# Patient Record
Sex: Female | Born: 1955 | Race: White | Hispanic: No | Marital: Married | Smoking: Current every day smoker
Health system: Southern US, Community
[De-identification: ages and names within clinical notes are randomized; demographics above are authoritative.]

## PROBLEM LIST (undated history)

## (undated) DIAGNOSIS — N390 Urinary tract infection, site not specified: Secondary | ICD-10-CM

## (undated) DIAGNOSIS — F78A9 Other genetic related intellectual disability: Secondary | ICD-10-CM

## (undated) DIAGNOSIS — I219 Acute myocardial infarction, unspecified: Secondary | ICD-10-CM

## (undated) DIAGNOSIS — Z9289 Personal history of other medical treatment: Secondary | ICD-10-CM

## (undated) DIAGNOSIS — I447 Left bundle-branch block, unspecified: Secondary | ICD-10-CM

## (undated) DIAGNOSIS — B019 Varicella without complication: Secondary | ICD-10-CM

## (undated) DIAGNOSIS — G709 Myoneural disorder, unspecified: Secondary | ICD-10-CM

## (undated) DIAGNOSIS — E079 Disorder of thyroid, unspecified: Secondary | ICD-10-CM

## (undated) DIAGNOSIS — N289 Disorder of kidney and ureter, unspecified: Secondary | ICD-10-CM

## (undated) DIAGNOSIS — Z151 Genetic susceptibility to epilepsy and neurodevelopmental disorders: Secondary | ICD-10-CM

## (undated) DIAGNOSIS — T7840XA Allergy, unspecified, initial encounter: Secondary | ICD-10-CM

## (undated) DIAGNOSIS — Q9359 Other deletions of part of a chromosome: Secondary | ICD-10-CM

## (undated) DIAGNOSIS — N183 Chronic kidney disease, stage 3 unspecified: Secondary | ICD-10-CM

## (undated) DIAGNOSIS — F79 Unspecified intellectual disabilities: Secondary | ICD-10-CM

## (undated) DIAGNOSIS — D560 Alpha thalassemia: Secondary | ICD-10-CM

## (undated) DIAGNOSIS — K921 Melena: Secondary | ICD-10-CM

## (undated) DIAGNOSIS — I728 Aneurysm of other specified arteries: Secondary | ICD-10-CM

## (undated) DIAGNOSIS — R519 Headache, unspecified: Secondary | ICD-10-CM

## (undated) DIAGNOSIS — I499 Cardiac arrhythmia, unspecified: Secondary | ICD-10-CM

## (undated) DIAGNOSIS — M1990 Unspecified osteoarthritis, unspecified site: Secondary | ICD-10-CM

## (undated) DIAGNOSIS — R51 Headache: Secondary | ICD-10-CM

## (undated) HISTORY — DX: Left bundle-branch block, unspecified: I44.7

## (undated) HISTORY — DX: Disorder of thyroid, unspecified: E07.9

## (undated) HISTORY — DX: Other deletions of part of a chromosome: Q93.59

## (undated) HISTORY — DX: Personal history of other medical treatment: Z92.89

## (undated) HISTORY — DX: Cardiac arrhythmia, unspecified: I49.9

## (undated) HISTORY — DX: Other genetic related intellectual disability: F78.A9

## (undated) HISTORY — DX: Myoneural disorder, unspecified: G70.9

## (undated) HISTORY — DX: Headache, unspecified: R51.9

## (undated) HISTORY — DX: Urinary tract infection, site not specified: N39.0

## (undated) HISTORY — DX: Allergy, unspecified, initial encounter: T78.40XA

## (undated) HISTORY — DX: Aneurysm of other specified arteries: I72.8

## (undated) HISTORY — PX: TUBAL LIGATION: SHX77

## (undated) HISTORY — DX: Melena: K92.1

## (undated) HISTORY — DX: Acute myocardial infarction, unspecified: I21.9

## (undated) HISTORY — DX: Unspecified intellectual disabilities: F79

## (undated) HISTORY — DX: Unspecified osteoarthritis, unspecified site: M19.90

## (undated) HISTORY — DX: Alpha thalassemia: D56.0

## (undated) HISTORY — DX: Varicella without complication: B01.9

## (undated) HISTORY — DX: Headache: R51

## (undated) HISTORY — DX: Genetic susceptibility to epilepsy and neurodevelopmental disorders: Z15.1

---
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
## 1979-05-29 HISTORY — PX: APPENDECTOMY: SHX54

## 1981-05-28 HISTORY — PX: OTHER SURGICAL HISTORY: SHX169

## 1990-05-28 HISTORY — PX: THUMB AMPUTATION: SHX804

## 1995-05-29 HISTORY — PX: ABDOMINAL HYSTERECTOMY: SHX81

## 2002-05-28 HISTORY — PX: CARDIAC CATHETERIZATION: SHX172

## 2004-05-28 HISTORY — PX: CARDIAC CATHETERIZATION: SHX172

## 2008-08-11 ENCOUNTER — Ambulatory Visit: Payer: Self-pay | Admitting: Internal Medicine

## 2008-08-16 LAB — FECAL OCCULT BLOOD, GUAIAC: Fecal Occult Blood: NEGATIVE

## 2010-11-10 ENCOUNTER — Ambulatory Visit: Payer: Self-pay | Admitting: Internal Medicine

## 2011-03-19 LAB — HM PAP SMEAR: HM Pap smear: NORMAL

## 2011-04-26 ENCOUNTER — Ambulatory Visit: Payer: Self-pay | Admitting: Family Medicine

## 2011-04-26 LAB — HM MAMMOGRAPHY: HM Mammogram: NORMAL

## 2012-08-01 ENCOUNTER — Ambulatory Visit: Payer: Self-pay | Admitting: Internal Medicine

## 2012-08-15 ENCOUNTER — Ambulatory Visit (INDEPENDENT_AMBULATORY_CARE_PROVIDER_SITE_OTHER): Payer: 59 | Admitting: Internal Medicine

## 2012-08-15 ENCOUNTER — Encounter: Payer: Self-pay | Admitting: Internal Medicine

## 2012-08-15 DIAGNOSIS — F17200 Nicotine dependence, unspecified, uncomplicated: Secondary | ICD-10-CM

## 2012-08-15 DIAGNOSIS — M719 Bursopathy, unspecified: Secondary | ICD-10-CM

## 2012-08-15 DIAGNOSIS — R3129 Other microscopic hematuria: Secondary | ICD-10-CM

## 2012-08-15 DIAGNOSIS — E039 Hypothyroidism, unspecified: Secondary | ICD-10-CM

## 2012-08-15 DIAGNOSIS — H833X3 Noise effects on inner ear, bilateral: Secondary | ICD-10-CM

## 2012-08-15 DIAGNOSIS — Z1239 Encounter for other screening for malignant neoplasm of breast: Secondary | ICD-10-CM

## 2012-08-15 DIAGNOSIS — H833X9 Noise effects on inner ear, unspecified ear: Secondary | ICD-10-CM

## 2012-08-15 DIAGNOSIS — K589 Irritable bowel syndrome without diarrhea: Secondary | ICD-10-CM

## 2012-08-15 DIAGNOSIS — D72829 Elevated white blood cell count, unspecified: Secondary | ICD-10-CM

## 2012-08-15 DIAGNOSIS — R0681 Apnea, not elsewhere classified: Secondary | ICD-10-CM

## 2012-08-15 DIAGNOSIS — R5381 Other malaise: Secondary | ICD-10-CM

## 2012-08-15 DIAGNOSIS — Z1211 Encounter for screening for malignant neoplasm of colon: Secondary | ICD-10-CM

## 2012-08-15 DIAGNOSIS — Z720 Tobacco use: Secondary | ICD-10-CM

## 2012-08-15 DIAGNOSIS — Z13220 Encounter for screening for lipoid disorders: Secondary | ICD-10-CM

## 2012-08-15 DIAGNOSIS — Z79899 Other long term (current) drug therapy: Secondary | ICD-10-CM

## 2012-08-15 DIAGNOSIS — R5383 Other fatigue: Secondary | ICD-10-CM

## 2012-08-15 DIAGNOSIS — D563 Thalassemia minor: Secondary | ICD-10-CM

## 2012-08-15 DIAGNOSIS — M7552 Bursitis of left shoulder: Secondary | ICD-10-CM

## 2012-08-15 DIAGNOSIS — L259 Unspecified contact dermatitis, unspecified cause: Secondary | ICD-10-CM

## 2012-08-15 DIAGNOSIS — M67919 Unspecified disorder of synovium and tendon, unspecified shoulder: Secondary | ICD-10-CM

## 2012-08-15 LAB — CBC WITH DIFFERENTIAL/PLATELET
Basophils Absolute: 0 10*3/uL (ref 0.0–0.1)
Basophils Relative: 0 % (ref 0–1)
Eosinophils Absolute: 0.2 10*3/uL (ref 0.0–0.7)
Eosinophils Relative: 2 % (ref 0–5)
HCT: 31.3 % — ABNORMAL LOW (ref 36.0–46.0)
Hemoglobin: 10 g/dL — ABNORMAL LOW (ref 12.0–15.0)
Lymphocytes Relative: 25 % (ref 12–46)
Lymphs Abs: 3 10*3/uL (ref 0.7–4.0)
MCH: 21.6 pg — ABNORMAL LOW (ref 26.0–34.0)
MCHC: 31.9 g/dL (ref 30.0–36.0)
MCV: 67.7 fL — ABNORMAL LOW (ref 78.0–100.0)
Monocytes Absolute: 0.9 10*3/uL (ref 0.1–1.0)
Monocytes Relative: 8 % (ref 3–12)
Neutro Abs: 7.9 10*3/uL — ABNORMAL HIGH (ref 1.7–7.7)
Neutrophils Relative %: 65 % (ref 43–77)
Platelets: 306 10*3/uL (ref 150–400)
RBC: 4.62 MIL/uL (ref 3.87–5.11)
RDW: 18.5 % — ABNORMAL HIGH (ref 11.5–15.5)
WBC: 12 10*3/uL — ABNORMAL HIGH (ref 4.0–10.5)

## 2012-08-15 LAB — COMPREHENSIVE METABOLIC PANEL
ALT: 18 U/L (ref 0–35)
AST: 20 U/L (ref 0–37)
Albumin: 4.6 g/dL (ref 3.5–5.2)
Alkaline Phosphatase: 65 U/L (ref 39–117)
BUN: 15 mg/dL (ref 6–23)
CO2: 27 mEq/L (ref 19–32)
Calcium: 9.7 mg/dL (ref 8.4–10.5)
Chloride: 106 mEq/L (ref 96–112)
Creat: 0.8 mg/dL (ref 0.50–1.10)
Glucose, Bld: 86 mg/dL (ref 70–99)
Potassium: 4.2 mEq/L (ref 3.5–5.3)
Sodium: 140 mEq/L (ref 135–145)
Total Bilirubin: 0.8 mg/dL (ref 0.3–1.2)
Total Protein: 6.5 g/dL (ref 6.0–8.3)

## 2012-08-15 LAB — LDL CHOLESTEROL, DIRECT: Direct LDL: 104 mg/dL — ABNORMAL HIGH

## 2012-08-15 LAB — TSH: TSH: 0.715 u[IU]/mL (ref 0.350–4.500)

## 2012-08-15 MED ORDER — HYDROCODONE-ACETAMINOPHEN 5-325 MG PO TABS: 1.0000 | ORAL_TABLET | Freq: Four times a day (QID) | ORAL | Status: DC | PRN

## 2012-08-15 MED ORDER — EPINEPHRINE 0.3 MG/0.3ML IJ DEVI: 0.3000 mg | Freq: Once | INTRAMUSCULAR | Status: DC

## 2012-08-15 MED ORDER — MELOXICAM 15 MG PO TABS: 15.0000 mg | ORAL_TABLET | Freq: Every day | ORAL | Status: DC

## 2012-08-15 NOTE — Patient Instructions (Addendum)
Please take one meloxicam daily for inflammation in your shoulder.  You have bursitis  You can use the vicodin as needed for pain control.   We will call you with lab results and referrals
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
## 2012-08-16 ENCOUNTER — Encounter: Payer: Self-pay | Admitting: Internal Medicine

## 2012-08-16 DIAGNOSIS — R3129 Other microscopic hematuria: Secondary | ICD-10-CM | POA: Insufficient documentation

## 2012-08-16 DIAGNOSIS — D563 Thalassemia minor: Secondary | ICD-10-CM | POA: Insufficient documentation

## 2012-08-17 ENCOUNTER — Encounter: Payer: Self-pay | Admitting: Internal Medicine

## 2012-08-17 DIAGNOSIS — K589 Irritable bowel syndrome without diarrhea: Secondary | ICD-10-CM

## 2012-08-17 DIAGNOSIS — H833X9 Noise effects on inner ear, unspecified ear: Secondary | ICD-10-CM | POA: Insufficient documentation

## 2012-08-17 DIAGNOSIS — K5790 Diverticulosis of intestine, part unspecified, without perforation or abscess without bleeding: Secondary | ICD-10-CM | POA: Insufficient documentation

## 2012-08-17 DIAGNOSIS — M7552 Bursitis of left shoulder: Secondary | ICD-10-CM | POA: Insufficient documentation

## 2012-08-17 DIAGNOSIS — G4733 Obstructive sleep apnea (adult) (pediatric): Secondary | ICD-10-CM | POA: Insufficient documentation

## 2012-08-17 DIAGNOSIS — E039 Hypothyroidism, unspecified: Secondary | ICD-10-CM | POA: Insufficient documentation

## 2012-08-17 DIAGNOSIS — L981 Factitial dermatitis: Secondary | ICD-10-CM | POA: Insufficient documentation

## 2012-08-17 DIAGNOSIS — D72829 Elevated white blood cell count, unspecified: Secondary | ICD-10-CM | POA: Insufficient documentation

## 2012-08-17 DIAGNOSIS — Z87891 Personal history of nicotine dependence: Secondary | ICD-10-CM | POA: Insufficient documentation

## 2012-08-17 DIAGNOSIS — Z720 Tobacco use: Secondary | ICD-10-CM | POA: Insufficient documentation

## 2012-08-17 HISTORY — DX: Irritable bowel syndrome, unspecified: K58.9
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
## 2012-08-17 NOTE — Assessment & Plan Note (Signed)
Records from  skin have been requested. According to patient she had one biopsy which showed discoid lupus but serologic evaluation for lupus was negative.

## 2012-08-17 NOTE — Assessment & Plan Note (Addendum)
Review of notes suggest that she has had blood transfusions in the past for symptomatic anemia. Her hemoglobin by CBC today is 10.

## 2012-08-17 NOTE — Assessment & Plan Note (Signed)
Regular screening has been aborted both with FOBT's and no prior colonoscopy. She is willing now to be referred for colonoscopy. Her husband saw Dr. Lemar Livings as she is requesting referral to him as well.

## 2012-08-17 NOTE — Assessment & Plan Note (Signed)
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 She is having trouble hearing high-pitched sounds and women's voices. Will refer to [facility] for audiology testing.

## 2012-08-17 NOTE — Assessment & Plan Note (Signed)
Colonoscopy has been ordered as she has had no prior.

## 2012-08-17 NOTE — Assessment & Plan Note (Signed)
Per patient she has reduced her consumption from one to 2 packs daily to less than half a pack a day. Encouragement provided

## 2012-08-17 NOTE — Addendum Note (Signed)
Addended by: Sherlene Shams on: 08/17/2012 09:52 AM   Modules accepted: Orders

## 2012-08-17 NOTE — Assessment & Plan Note (Signed)
Her husband has noticed apneic breathing episodes during sleep. Referral for sleep study.

## 2012-08-17 NOTE — Assessment & Plan Note (Signed)
Managed by patient preference on Armour thyroid supplementation. TSH was last checked in August 2013 at which time her level was 0.788. We will recheck this today.

## 2012-08-18 ENCOUNTER — Encounter: Payer: Self-pay | Admitting: General Practice

## 2012-08-22 ENCOUNTER — Encounter: Payer: Self-pay | Admitting: Cardiovascular Disease

## 2012-08-22 ENCOUNTER — Ambulatory Visit (INDEPENDENT_AMBULATORY_CARE_PROVIDER_SITE_OTHER): Payer: 59 | Admitting: Cardiovascular Disease

## 2012-08-22 VITALS — BP 140/64 | HR 85 | Ht 63.0 in | Wt 151.5 lb

## 2012-08-22 DIAGNOSIS — R079 Chest pain, unspecified: Secondary | ICD-10-CM

## 2012-08-22 DIAGNOSIS — F411 Generalized anxiety disorder: Secondary | ICD-10-CM

## 2012-08-22 DIAGNOSIS — R000 Tachycardia, unspecified: Secondary | ICD-10-CM

## 2012-08-22 DIAGNOSIS — R002 Palpitations: Secondary | ICD-10-CM

## 2012-08-22 DIAGNOSIS — Z720 Tobacco use: Secondary | ICD-10-CM

## 2012-08-22 DIAGNOSIS — R0681 Apnea, not elsewhere classified: Secondary | ICD-10-CM

## 2012-08-22 DIAGNOSIS — F17200 Nicotine dependence, unspecified, uncomplicated: Secondary | ICD-10-CM

## 2012-08-22 DIAGNOSIS — F419 Anxiety disorder, unspecified: Secondary | ICD-10-CM

## 2012-08-22 MED ORDER — NITROGLYCERIN 0.4 MG SL SUBL: 0.4000 mg | SUBLINGUAL_TABLET | SUBLINGUAL | Status: DC | PRN

## 2012-08-22 MED ORDER — PROPRANOLOL HCL 10 MG PO TABS: 10.0000 mg | ORAL_TABLET | Freq: Three times a day (TID) | ORAL | Status: DC | PRN

## 2012-08-22 NOTE — Assessment & Plan Note (Signed)
She suspects he might have some periods of anxiety. We have suggested she get on a regular exercise program

## 2012-08-22 NOTE — Assessment & Plan Note (Signed)
Prior history of atypical chest pain, prior stress testing and cardiac catheterization showing no ischemia and no CAD. Symptoms are atypical in nature. She does not want any further testing at this time and does definitely not want a cardiac catheterization (this was offered by outside cardiologist).

## 2012-08-22 NOTE — Assessment & Plan Note (Signed)
We have encouraged her to continue to work on weaning her cigarettes and smoking cessation. She will continue to work on this and does not want any assistance with chantix.  

## 2012-08-22 NOTE — Patient Instructions (Addendum)
You are doing well.  Consider Red yeast rice 2 to 4 pills a day for LDL and total cholesterol Fish oil does triglyceride  Please take propranolol as needed for tachycardia, ok to repeat if needed Nitroglycerin as needed for chest pain WEAN OF the CIGS!!  Please call us if you have new issues that need to be addressed before your next appt.  Your physician wants you to follow-up in: 12 months.  You will receive a reminder letter in the mail two months in advance. If you don't receive a letter, please call our office to schedule the follow-up appointment.

## 2012-08-22 NOTE — Progress Notes (Signed)
Patient ID: Cheryl [name], female    DOB: 1955-10-20, 57 y.o.   MRN: [id number]  HPI Comments: [name] is a 57 year old woman, patient of Dr. Darrick Huntsman, with left bundle branch block, prior history of chest pain, workup dating back more than 10 years ago with stress testing, cardiac catheterization at [facility] with reportedly no significant CAD at that time in 2003, additional episodes of palpitations and chest pain, workup in June 2012 with stress testing/Myoview showing no ischemia who presents to establish care.Long history of smoking who continues to smoke. Possible apneic periods when sleeping on her back. Rare episodes of tachycardia, once per month.  Reports that she smokes one half pack per day, down from 2 packs per day.  She is active, works in her yardAnd does significant activities. No significant reproducible chest pain with exertion. She does report having new recent episode of palpitations with some associated chest tightness. She has had to these episodes. Typically they come on at rest.she feels her chest pain and palpitations is secondary to anxiety.  She does have significant shoulder discomfort and is taking meloxicam.   Stress test 11/10/2010 showing no ischemia. Myoview study Outside echocardiogram 11/08/2010 showing normal ejection fraction 60%   otherwise normal study EKG today shows normal sinus rhythm withRate 85 beats per minute, left bundle branch block   Outpatient Encounter Prescriptions as of 08/22/2012  Medication Sig Dispense Refill  . Calcium Carbonate-Vit D-Min (CALCIUM 1200 PO) Take 1 tablet by mouth daily.      . Cholecalciferol (VITAMIN D PO) Take by mouth. Pt takes 5000iu daily      . CINNAMON PO Take 2,000 mg by mouth daily.      . clobetasol ointment (TEMOVATE) 0.05 % Apply 1 application topically 2 (two) times daily.      Tery Sanfilippo Calcium (STOOL SOFTENER PO) Take by mouth.      . EPINEPHrine (EPIPEN) 0.3 mg/0.3 mL DEVI Inject 0.3 mLs (0.3 mg total) into  the muscle once.  1 Device  5  . folic acid 5 MG/ML injection Inject 1mL into the skin intramuscularly once a week.      [name] HYDROcodone-acetaminophen (NORCO/VICODIN) 5-325 MG per tablet Take 1 tablet by mouth every 6 (six) hours as needed for pain.  60 tablet  0  . meloxicam (MOBIC) 15 MG tablet Take 1 tablet (15 mg total) by mouth daily.  30 tablet  5  . Multiple Vitamin (MULTIVITAMIN) tablet Take 1 tablet by mouth daily.      . Omega-3 Fatty Acids (FISH OIL) 1000 MG CAPS Take 1 capsule by mouth daily.      . progesterone (PROMETRIUM) 200 MG capsule Take 200 mg by mouth daily.      [name] thyroid (ARMOUR) 30 MG tablet Take 30 mg by mouth daily.      . vitamin C (ASCORBIC ACID) 500 MG tablet Take 500 mg by mouth daily.      . nitroGLYCERIN (NITROSTAT) 0.4 MG SL tablet Place 1 tablet (0.4 mg total) under the tongue every 5 (five) minutes as needed for chest pain.  25 tablet  3  . propranolol (INDERAL) 10 MG tablet Take 1 tablet (10 mg total) by mouth 3 (three) times daily as needed.  90 tablet  6    Review of Systems  Constitutional: Negative.   HENT: Negative.   Eyes: Negative.   Respiratory: Positive for shortness of breath.   Cardiovascular: Positive for chest pain.  Gastrointestinal: Negative.   Musculoskeletal: Negative.  Skin: Negative.   Neurological: Negative.   Psychiatric/Behavioral: Negative.   All other systems reviewed and are negative.    BP 140/64  Pulse 85  Ht 5\' 3"  (1.6 m)  Wt 151 lb 8 oz (68.72 kg)  BMI 26.84 kg/m2  Physical Exam  Nursing note and vitals reviewed. Constitutional: She is oriented to person, place, and time. She appears well-developed and well-nourished.  HENT:  Head: Normocephalic.  Nose: Nose normal.  Mouth/Throat: Oropharynx is clear and moist.  Eyes: Conjunctivae are normal. Pupils are equal, round, and reactive to light.  Neck: Normal range of motion. Neck supple. No JVD present.  Cardiovascular: Normal rate, regular rhythm, S1 normal, S2  normal, normal heart sounds and intact distal pulses.  Exam reveals no gallop and no friction rub.   No murmur heard. Pulmonary/Chest: Effort normal and breath sounds normal. No respiratory distress. She has no wheezes. She has no rales. She exhibits no tenderness.  Abdominal: Soft. Bowel sounds are normal. She exhibits no distension. There is no tenderness.  Musculoskeletal: Normal range of motion. She exhibits no edema and no tenderness.  Lymphadenopathy:    She has no cervical adenopathy.  Neurological: She is alert and oriented to person, place, and time. Coordination normal.  Skin: Skin is warm and dry. No rash noted. No erythema.  Psychiatric: She has a normal mood and affect. Her behavior is normal. Judgment and thought content normal.    Assessment and Plan

## 2012-08-22 NOTE — Assessment & Plan Note (Signed)
Encouraged her to follow-through with a sleep study.

## 2012-08-22 NOTE — Assessment & Plan Note (Signed)
Some of her chest symptoms seem to be related to periods of tachycardia. We have given her a prescription for propranolol to take as needed. Unable to exclude short runs of SVT or other arrhythmia such as atrial tachycardia. She will try to monitor her heart rate during these episodes. She does not want a 30 day monitor at this time.

## 2012-09-02 ENCOUNTER — Encounter: Payer: Self-pay | Admitting: Internal Medicine

## 2012-09-09 ENCOUNTER — Ambulatory Visit: Payer: Self-pay | Admitting: Internal Medicine

## 2012-09-09 ENCOUNTER — Ambulatory Visit: Payer: Self-pay | Admitting: General Surgery

## 2012-09-17 ENCOUNTER — Ambulatory Visit (INDEPENDENT_AMBULATORY_CARE_PROVIDER_SITE_OTHER): Payer: 59 | Admitting: General Surgery

## 2012-09-17 ENCOUNTER — Encounter: Payer: Self-pay | Admitting: General Surgery

## 2012-09-17 VITALS — BP 122/58 | HR 74 | Resp 16 | Ht 63.0 in | Wt 148.0 lb

## 2012-09-17 DIAGNOSIS — Z1211 Encounter for screening for malignant neoplasm of colon: Secondary | ICD-10-CM

## 2012-09-17 MED ORDER — POLYETHYLENE GLYCOL 3350 17 GM/SCOOP PO POWD: ORAL | Status: DC

## 2012-09-17 NOTE — Patient Instructions (Addendum)
Patient to have screening colonoscopy scheduled.   Colonoscopy A colonoscopy is an exam to evaluate your entire colon. In this exam, your colon is cleansed. A long fiberoptic tube is inserted through your rectum and into your colon. The fiberoptic scope (endoscope) is a long bundle of enclosed and very flexible fibers. These fibers transmit light to the area examined and send images from that area to your caregiver. Discomfort is usually minimal. You may be given a drug to help you sleep (sedative) during or prior to the procedure. This exam helps to detect lumps (tumors), polyps, inflammation, and areas of bleeding. Your caregiver may also take a small piece of tissue (biopsy) that will be examined under a microscope. LET YOUR CAREGIVER KNOW ABOUT:   Allergies to food or medicine.  Medicines taken, including vitamins, herbs, eyedrops, over-the-counter medicines, and creams.  Use of steroids (by mouth or creams).  Previous problems with anesthetics or numbing medicines.  History of bleeding problems or blood clots.  Previous surgery.  Other health problems, including diabetes and kidney problems.  Possibility of pregnancy, if this applies. BEFORE THE PROCEDURE   A clear liquid diet may be required for 2 days before the exam.  Ask your caregiver about changing or stopping your regular medications.  Liquid injections (enemas) or laxatives may be required.  A large amount of electrolyte solution may be given to you to drink over a short period of time. This solution is used to clean out your colon.  You should be present 60 minutes prior to your procedure or as directed by your caregiver. AFTER THE PROCEDURE   If you received a sedative or pain relieving medication, you will need to arrange for someone to drive you home.  Occasionally, there is a little blood passed with the first bowel movement. Do not be concerned. FINDING OUT THE RESULTS OF YOUR TEST Not all test results are  available during your visit. If your test results are not back during the visit, make an appointment with your caregiver to find out the results. Do not assume everything is normal if you have not heard from your caregiver or the medical facility. It is important for you to follow up on all of your test results. HOME CARE INSTRUCTIONS   It is not unusual to pass moderate amounts of gas and experience mild abdominal cramping following the procedure. This is due to air being used to inflate your colon during the exam. Walking or a warm pack on your belly (abdomen) may help.  You may resume all normal meals and activities after sedatives and medicines have worn off.  Only take over-the-counter or prescription medicines for pain, discomfort, or fever as directed by your caregiver. Do not use aspirin or blood thinners if a biopsy was taken. Consult your caregiver for medicine usage if biopsies were taken. SEEK IMMEDIATE MEDICAL CARE IF:   You have a fever.  You pass large blood clots or fill a toilet with blood following the procedure. This may also occur 10 to 14 days following the procedure. This is more likely if a biopsy was taken.  You develop abdominal pain that keeps getting worse and cannot be relieved with medicine. Document Released: 05/11/2000 Document Revised: 08/06/2011 Document Reviewed: 12/25/2007 [facility] Patient Information 2013 [place], [place].  Patient has been scheduled for a colonoscopy on 10-28-2012 at [facility]. This patient has been asked to discontinue fish oil one week prior to procedure.

## 2012-09-17 NOTE — Progress Notes (Signed)
Patient ID: Cheryl [name], female   DOB: February 16, 1956, 57 y.o.   MRN: [id number]  Chief Complaint  Patient presents with  . Colonoscopy    HPI Cheryl [name] is a 57 y.o. female here today for colonoscopy screening. Patient states no prior colonoscopies. Patient's mother as well as her son have a history of colon polyps. No current problems at this time.  HPI  Past Medical History  Diagnosis Date  . Arthritis   . Blood in stool   . Chicken pox   . Generalized headaches   . History of blood transfusion   . Thyroid disease   . UTI (lower urinary tract infection)   . Arrhythmia     left bundle branch block    Past Surgical History  Procedure Laterality Date  . Breast surgery  1976  . Appendectomy  1981  . Tonsillectomy and adenoidectomy  1964  . Cystic fibrosis tumor removal  1983  . 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 Cardiac catheterization  2004    [facility]  . Cardiac catheterization  2006    [facility]  . Abdominal hysterectomy  1997    Family History  Problem Relation Age of Onset  . Heart disease Mother   . Arthritis Mother   . Cancer Mother     breast  . Hyperlipidemia Mother   . Hypertension Mother   . Heart attack Mother   . Heart disease Father   . Cancer Father     hodgkins disease, prostate  . Diabetes Father   . Arthritis Father   . Hypertension Father   . Heart disease Sister   . Heart disease Brother   . Diabetes Brother   . Liver disease Brother   . Lung disease Brother   . Diabetes Maternal Aunt   . Heart disease Maternal Grandmother   . Diabetes Maternal Grandmother   . Heart disease Maternal Grandfather   . Heart disease Paternal Grandmother   . Diabetes Paternal Grandmother   . Stroke Paternal Grandfather   . Heart disease Paternal Grandfather   . Diabetes Paternal Grandfather     Social History History  Substance Use Topics  . Smoking status: Current Every Day Smoker -- 0.25 packs/day for 34 years    Types: Cigarettes  . Smokeless  tobacco: Never Used  . Alcohol Use: Yes     Comment: occasional    Allergies  Allergen Reactions  . Peanuts (Peanut Oil)   . Penicillins Other (See Comments)    Hives,rash,nausea,swelling,   . Sulfa Antibiotics Other (See Comments)    Hives,rash,nausea,swelling    Current Outpatient Prescriptions  Medication Sig Dispense Refill  . Calcium Carbonate-Vit D-Min (CALCIUM 1200 PO) Take 1 tablet by mouth daily.      . Cholecalciferol (VITAMIN D PO) Take by mouth. Pt takes 5000iu daily      . CINNAMON PO Take 2,000 mg by mouth daily.      . clobetasol ointment (TEMOVATE) 0.05 % Apply 1 application topically 2 (two) times daily.      . Cyanocobalamin (VITAMIN B 12 PO) Take by mouth.      Tery Sanfilippo Calcium (STOOL SOFTENER PO) Take by mouth.      . EPINEPHrine (EPIPEN) 0.3 mg/0.3 mL DEVI Inject 0.3 mLs (0.3 mg total) into the muscle once.  1 Device  5  . meloxicam (MOBIC) 15 MG tablet Take 1 tablet (15 mg total) by mouth daily.  30 tablet  5  . nitroGLYCERIN (NITROSTAT)  0.4 MG SL tablet Place 1 tablet (0.4 mg total) under the tongue every 5 (five) minutes as needed for chest pain.  25 tablet  3  . Omega-3 Fatty Acids (FISH OIL) 1000 MG CAPS Take 1 capsule by mouth daily.      . progesterone (PROMETRIUM) 200 MG capsule Take 200 mg by mouth daily.      . propranolol (INDERAL) 10 MG tablet Take 1 tablet (10 mg total) by mouth 3 (three) times daily as needed.  90 tablet  6  . thyroid (ARMOUR) 30 MG tablet Take 30 mg by mouth daily.      . vitamin C (ASCORBIC ACID) 500 MG tablet Take 500 mg by mouth daily.      . Multiple Vitamin (MULTIVITAMIN) tablet Take 1 tablet by mouth daily.      . polyethylene glycol powder (GLYCOLAX/MIRALAX) powder 255 grams one bottle for colonoscopy prep  255 g  0   No current facility-administered medications for this visit.    Review of Systems Review of Systems  Constitutional: Negative.   Respiratory: Negative.   Cardiovascular: Negative.   Gastrointestinal:  Negative.     Blood pressure 122/58, pulse 74, resp. rate 16, height 5\' 3"  (1.6 m), weight 148 lb (67.132 kg).  Physical Exam Physical Exam  Constitutional: She appears well-developed and well-nourished.  Neck: Trachea normal. No mass and no thyromegaly present.  Cardiovascular: Normal rate, regular rhythm, normal heart sounds and normal pulses.   No murmur heard. Pulmonary/Chest: Effort normal and breath sounds normal.    Data Reviewed None.   Assessment    Patient meet screening colonoscopy criteria.     Plan          Patient has been scheduled for a colonoscopy on 10-28-2012 at [facility]. This patient has been asked to discontinue fish oil one week prior to procedure.   Earline Mayotte 09/17/2012, 9:58 PM

## 2012-09-24 ENCOUNTER — Encounter: Payer: Self-pay | Admitting: Internal Medicine

## 2012-09-26 ENCOUNTER — Encounter: Payer: Self-pay | Admitting: Internal Medicine

## 2012-10-15 ENCOUNTER — Other Ambulatory Visit: Payer: Self-pay | Admitting: General Surgery

## 2012-10-15 DIAGNOSIS — Z1211 Encounter for screening for malignant neoplasm of colon: Secondary | ICD-10-CM

## 2012-10-16 ENCOUNTER — Telehealth: Payer: Self-pay | Admitting: *Deleted

## 2012-10-16 NOTE — Telephone Encounter (Signed)
Patient was contacted at the request of Dr. Lemar Livings (due to injury) to reschedule colonoscopy that was scheduled for 10-28-12 at [facility]. This patient has rescheduled to 11-19-12. She will be contacted prior to verify no medication changes.  Trish in endoscopy notified of date change.

## 2012-10-24 ENCOUNTER — Encounter: Payer: Self-pay | Admitting: Internal Medicine

## 2012-10-24 ENCOUNTER — Ambulatory Visit (INDEPENDENT_AMBULATORY_CARE_PROVIDER_SITE_OTHER): Payer: 59 | Admitting: Internal Medicine

## 2012-10-24 VITALS — BP 128/72 | HR 71 | Temp 98.1°F | Resp 16 | Ht 63.0 in | Wt 141.5 lb

## 2012-10-24 DIAGNOSIS — M19019 Primary osteoarthritis, unspecified shoulder: Secondary | ICD-10-CM

## 2012-10-24 DIAGNOSIS — M7552 Bursitis of left shoulder: Secondary | ICD-10-CM

## 2012-10-24 DIAGNOSIS — Z0000 Encounter for general adult medical examination without abnormal findings: Secondary | ICD-10-CM

## 2012-10-24 DIAGNOSIS — R0681 Apnea, not elsewhere classified: Secondary | ICD-10-CM

## 2012-10-24 DIAGNOSIS — L259 Unspecified contact dermatitis, unspecified cause: Secondary | ICD-10-CM

## 2012-10-24 DIAGNOSIS — Z124 Encounter for screening for malignant neoplasm of cervix: Secondary | ICD-10-CM

## 2012-10-24 DIAGNOSIS — D563 Thalassemia minor: Secondary | ICD-10-CM

## 2012-10-24 DIAGNOSIS — D72829 Elevated white blood cell count, unspecified: Secondary | ICD-10-CM

## 2012-10-24 DIAGNOSIS — M19011 Primary osteoarthritis, right shoulder: Secondary | ICD-10-CM

## 2012-10-24 DIAGNOSIS — M67919 Unspecified disorder of synovium and tendon, unspecified shoulder: Secondary | ICD-10-CM

## 2012-10-24 MED ORDER — HYDROCODONE-ACETAMINOPHEN 5-325 MG PO TABS: 1.0000 | ORAL_TABLET | Freq: Four times a day (QID) | ORAL | Status: DC | PRN

## 2012-10-24 NOTE — Progress Notes (Signed)
Patient ID: Cheryl [name], female   DOB: Dec 09, 1955, 57 y.o.   MRN: [id number]    Subjective:     Cheryl [name] is a 57 y.o. female and is here for a comprehensive physical exam. The patient reports shoulder pain, persistent, relieved with vicodin,  intolerant of NSAIDs due to nausea. .  History   Social History  . Marital Status: Married    Spouse Name: N/A    Number of Children: N/A  . Years of Education: N/A   Occupational History  . Not on file.   Social History Main Topics  . Smoking status: Current Every Day Smoker -- 0.25 packs/day for 34 years    Types: Cigarettes  . Smokeless tobacco: Never Used  . Alcohol Use: Yes     Comment: occasional  . Drug Use: No  . Sexually Active: Not on file   Other Topics Concern  . Not on file   Social History Narrative   Married to [employer]   Works for [employer] Date Due  . Colonoscopy  07/01/2005  . Influenza Vaccine  01/26/2013  . Pap Smear  03/18/2014  . Mammogram  09/10/2014  . Tetanus/tdap  03/18/2021    The following portions of the patient's history were reviewed and updated as appropriate: allergies, current medications, past family history, past medical history, past social history, past surgical history and problem list.  Review of Systems A comprehensive review of systems was negative.   Objective:   BP 128/72  Pulse 71  Temp(Src) 98.1 F (36.7 C) (Oral)  Resp 16  Ht 5\' 3"  (1.6 m)  Wt 141 lb 8 oz (64.184 kg)  BMI 25.07 kg/m2  SpO2 99%  General Appearance:    Alert, cooperative, no distress, appears stated age  Head:    Normocephalic, without obvious abnormality, atraumatic  Eyes:    PERRL, conjunctiva/corneas clear, EOM's intact, fundi    benign, both eyes  Ears:    Normal TM's and external ear canals, both ears  Nose:   Nares normal, septum midline, mucosa normal, no drainage    or sinus tenderness  Throat:   Lips, mucosa, and tongue normal;  teeth and gums normal  Neck:   Supple, symmetrical, trachea midline, no adenopathy;    thyroid:  no enlargement/tenderness/nodules; no carotid   bruit or JVD  Back:     Symmetric, no curvature, ROM normal, no CVA tenderness  Lungs:     Clear to auscultation bilaterally, respirations unlabored  Chest Wall:    No tenderness or deformity   Heart:    Regular rate and rhythm, S1 and S2 normal, no murmur, rub   or gallop  Breast Exam:    No tenderness, masses, or nipple abnormality  Abdomen:     Soft, non-tender, bowel sounds active all four quadrants,    no masses, no organomegaly  Genitalia:    Pelvic: cervix surgically absent uterus absent,  Ovaries nonpalpable  rectovaginal septum normal, and vagina normal without discharge  Extremities:   Extremities normal, atraumatic, no cyanosis or edema  Pulses:   2+ and symmetric all extremities  Skin:   Skin color, texture, turgor normal, no rashes or lesions  Lymph nodes:   Cervical, supraclavicular, and axillary nodes normal  Neurologic:   CNII-XII intact, normal strength, sensation and reflexes    throughout     Assessment:   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   Leukocytosis, unspecified Stable, per patient  has been chronic due to thalassemia.   Contact dermatitis Treating for folliculitis.   Witnessed apneic spells Her sleep study has been postponed, which may impact Dr. Rutherford Nail decision to do her colonoscopy.  Will contact his office and let them know a sleep study is scheduled.  Routine general medical examination at a health care facility Annual comprehensive exam was done including breast, pelvic without PAP smear. All screenings have been addressed .   Bursitis of left shoulder She did not tolerate NSAIDs and is requesting refill of vicodin to help her sleep.    Updated Medication List Outpatient Encounter Prescriptions as of 10/24/2012  Medication Sig Dispense Refill  . Calcium Carbonate-Vit D-Min (CALCIUM  1200 PO) Take 1 tablet by mouth daily.      . Cholecalciferol (VITAMIN D PO) Take by mouth. Pt takes 5000iu daily      . CINNAMON PO Take 2,000 mg by mouth daily.      . clobetasol ointment (TEMOVATE) 0.05 % Apply 1 application topically 2 (two) times daily.      . Cyanocobalamin (VITAMIN B 12 PO) Take by mouth.      . EPINEPHrine (EPIPEN) 0.3 mg/0.3 mL DEVI Inject 0.3 mLs (0.3 mg total) into the muscle once.  1 Device  5  . Multiple Vitamin (MULTIVITAMIN) tablet Take 1 tablet by mouth daily.      . Omega-3 Fatty Acids (FISH OIL) 1000 MG CAPS Take 1 capsule by mouth daily.      . polyethylene glycol powder (GLYCOLAX/MIRALAX) powder 255 grams one bottle for colonoscopy prep  255 g  0  . progesterone (PROMETRIUM) 200 MG capsule Take 200 mg by mouth daily.      . propranolol (INDERAL) 10 MG tablet Take 1 tablet (10 mg total) by mouth 3 (three) times daily as needed.  90 tablet  6  . thyroid (ARMOUR) 30 MG tablet Take 30 mg by mouth daily.      . vitamin C (ASCORBIC ACID) 500 MG tablet Take 500 mg by mouth daily.      . [DISCONTINUED] meloxicam (MOBIC) 15 MG tablet Take 1 tablet (15 mg total) by mouth daily.  30 tablet  5  . Docusate Calcium (STOOL SOFTENER PO) Take by mouth.      [name] HYDROcodone-acetaminophen (NORCO/VICODIN) 5-325 MG per tablet Take 1 tablet by mouth every 6 (six) hours as needed for pain.  30 tablet  3  . nitroGLYCERIN (NITROSTAT) 0.4 MG SL tablet Place 1 tablet (0.4 mg total) under the tongue every 5 (five) minutes as needed for chest pain.  25 tablet  3   No facility-administered encounter medications on file as of 10/24/2012.

## 2012-10-24 NOTE — Patient Instructions (Addendum)
We will contact Dr byrnett's office about the sleep aonea issue to see if the colonoscopy needs to be postponed   We are repeating your CBC today to reevaluate your white count which was elevated ahnd hemoglobin which was low at  last check

## 2012-10-26 ENCOUNTER — Telehealth: Payer: Self-pay | Admitting: Internal Medicine

## 2012-10-26 ENCOUNTER — Encounter: Payer: Self-pay | Admitting: Internal Medicine

## 2012-10-26 DIAGNOSIS — Z0000 Encounter for general adult medical examination without abnormal findings: Secondary | ICD-10-CM | POA: Insufficient documentation

## 2012-10-26 NOTE — Assessment & Plan Note (Signed)
Her sleep study has been postponed, which may impact Dr. Rutherford Nail decision to do her colonoscopy.  Will contact his office and let them know a sleep study is scheduled.

## 2012-10-26 NOTE — Assessment & Plan Note (Signed)
Annual comprehensive exam was done including breast, pelvic and PAP smear. All screenings have been addressed .  

## 2012-10-26 NOTE — Assessment & Plan Note (Signed)
Stable, per patient has been chronic due to thalassemia.

## 2012-10-26 NOTE — Telephone Encounter (Signed)
She is scheduled for a sleep study but it not happen before her planned colonoscopy .  She has had witnessed apneic spells.

## 2012-10-26 NOTE — Assessment & Plan Note (Signed)
Treating for folliculitis.

## 2012-10-26 NOTE — Assessment & Plan Note (Signed)
With stable hgb by recent check.

## 2012-10-26 NOTE — Assessment & Plan Note (Signed)
She did not tolerate NSAIDs and is requesting refill of vicodin to help her sleep.

## 2012-10-28 ENCOUNTER — Telehealth: Payer: Self-pay | Admitting: *Deleted

## 2012-10-28 ENCOUNTER — Other Ambulatory Visit (HOSPITAL_COMMUNITY)
Admission: RE | Admit: 2012-10-28 | Discharge: 2012-10-28 | Disposition: A | Discharge status: Home / Self Care | Payer: 59 | Source: Ambulatory Visit | Attending: Internal Medicine | Admitting: Internal Medicine

## 2012-10-28 DIAGNOSIS — Z1151 Encounter for screening for human papillomavirus (HPV): Secondary | ICD-10-CM | POA: Insufficient documentation

## 2012-10-28 DIAGNOSIS — Z01419 Encounter for gynecological examination (general) (routine) without abnormal findings: Secondary | ICD-10-CM | POA: Insufficient documentation

## 2012-10-28 MED ORDER — DOXYCYCLINE HYCLATE 100 MG PO TABS: 100.0000 mg | ORAL_TABLET | Freq: Two times a day (BID) | ORAL | Status: DC

## 2012-10-28 NOTE — Telephone Encounter (Signed)
Left patient detailed message.

## 2012-10-28 NOTE — Telephone Encounter (Signed)
Patient stated you were to call and ABX for rash on her legs, please advise. OV 10/24/12

## 2012-10-28 NOTE — Telephone Encounter (Signed)
Sent rx for doxycycline to pharmacy.  TAKE WITH FOOD TWICE DAILY UNTIL GONE AVOID SUN EXPOSURE WHILE TAKING IT AS IT MAY CAUSE A RASH.  There are are no other options given her drug allergies.

## 2012-10-28 NOTE — Addendum Note (Signed)
Addended by: Liane Comber C on: 10/28/2012 12:51 PM   Modules accepted: Orders

## 2012-10-30 ENCOUNTER — Other Ambulatory Visit: Payer: Self-pay | Admitting: *Deleted

## 2012-10-30 DIAGNOSIS — Z139 Encounter for screening, unspecified: Secondary | ICD-10-CM

## 2012-11-02 LAB — CULTURE, ROUTINE-GENITAL: Organism ID, Bacteria: NORMAL

## 2012-11-06 ENCOUNTER — Ambulatory Visit: Payer: Self-pay | Admitting: Internal Medicine

## 2012-11-13 ENCOUNTER — Telehealth: Payer: Self-pay | Admitting: *Deleted

## 2012-11-13 ENCOUNTER — Encounter: Payer: 59 | Admitting: Internal Medicine

## 2012-11-13 NOTE — Telephone Encounter (Signed)
Patient reports that she has had no medication changes since last office visit. She was instructed to go ahead and pre-register since she has not done so already. We will proceed with colonoscopy that is scheduled for 11-19-12 at [facility]. Patient to call if she has further questions.

## 2012-11-14 ENCOUNTER — Encounter: Payer: 59 | Admitting: Internal Medicine

## 2012-11-17 ENCOUNTER — Telehealth: Payer: Self-pay | Admitting: Internal Medicine

## 2012-11-17 NOTE — Telephone Encounter (Signed)
Patient Information:  Caller Name: Merrissa  Phone: [phone]  Patient: Cheryl [name], Cheryl [name]  Gender: Female  DOB: November 13, 1955  Age: 57 Years  PCP: Duncan Dull (Adults only)  Office Follow Up:  Does the office need to follow up with this patient?: Yes  Instructions For The Office: Pleae call back regarding work in appointment.  RN Note:  Hysterectomy.  Asking for stronger cream to use for spreading itchy, patchy, fine papular widespread rash.  Rash is fixed so does not sound like hives. Concerned rash may be related to diet, specifically a gluten sensitivity. No appointments remain with any provider at [facility] office.  Declined to be seen at [facility] or [place] office due to distance.  Please call back regarding appointment.   Symptoms  Reason For Call & Symptoms: Ongoing, spreading, itchy papular rash.  Completed antibiotic for folliculitis and contact dermatitis.  No relief from Hydrocortisone cream used every 2 hours. 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  Reviewed Health History In EMR: Yes  Reviewed Medications In EMR: Yes  Reviewed Allergies In EMR: Yes  Reviewed Surgeries / Procedures: Yes  Date of Onset of Symptoms: Unknown  Treatments Tried: Benadryl, Doxicycline, Hydrocortisone cream, saw Dermatologist who did skin biopsy.  Treatments Tried Worked: No  Guideline(s) Used:  Rash or Redness - Widespread  Disposition Per Guideline:   See Today in Office  Reason For Disposition Reached:   Severe itching  Advice Given:  For Itchy Rashes:  Wash the skin once with gentle non-perfumed soap to remove any irritants. Rinse the soap off thoroughly.  You may also take an oatmeal (Aveeno) bath or take an antihistamine medication by mouth to help reduce the itching.  Oral Antihistamine Medication for Itching:  An over-the-counter antihistamine that causes less sleepiness is loratadine (e.g., Alavert or Claritin).  Call Back If:   You become  worse  Patient Will Follow Care Advice:  YES

## 2012-11-17 NOTE — Telephone Encounter (Signed)
Called patient left message on number to call office.

## 2012-11-17 NOTE — Telephone Encounter (Signed)
Patient come in to see Raquel Rey NP 11/19/12

## 2012-11-18 LAB — HM PAP SMEAR: HM Pap smear: NORMAL

## 2012-11-19 ENCOUNTER — Ambulatory Visit: Payer: Self-pay | Admitting: General Surgery

## 2012-11-19 ENCOUNTER — Ambulatory Visit: Payer: Self-pay | Admitting: Internal Medicine

## 2012-11-19 ENCOUNTER — Ambulatory Visit: Payer: 59 | Admitting: Adult Health

## 2012-11-19 DIAGNOSIS — Z1211 Encounter for screening for malignant neoplasm of colon: Secondary | ICD-10-CM

## 2012-11-20 ENCOUNTER — Ambulatory Visit: Payer: 59 | Admitting: Adult Health

## 2012-11-20 ENCOUNTER — Encounter: Payer: Self-pay | Admitting: General Surgery

## 2012-11-21 ENCOUNTER — Encounter: Payer: Self-pay | Admitting: Adult Health

## 2012-11-21 ENCOUNTER — Ambulatory Visit (INDEPENDENT_AMBULATORY_CARE_PROVIDER_SITE_OTHER): Payer: 59 | Admitting: Adult Health

## 2012-11-21 VITALS — BP 118/62 | HR 77 | Temp 98.1°F | Resp 12 | Wt 141.0 lb

## 2012-11-21 DIAGNOSIS — R21 Rash and other nonspecific skin eruption: Secondary | ICD-10-CM

## 2012-11-21 MED ORDER — PREDNISONE 10 MG PO TABS: ORAL_TABLET | ORAL | Status: DC

## 2012-11-21 NOTE — Progress Notes (Signed)
Subjective:    Patient ID: Cheryl [name], female    DOB: 05/16/1956, 57 y.o.   MRN: [id number]  HPI                                                                                                                                                                                                                                                                                                                Patient present to clinic with rash. She reports break outs x 1 year now. She has this on her chest, back, legs. No contacts that may cause rash. She reports being very careful when outdoors. She has been seen by Derm and started on Clobetasol but this has not recently helps. Recently more uncomfortable. Denies shortness of breath.   Current Outpatient Prescriptions on File Prior to Visit  Medication Sig Dispense Refill  . Calcium Carbonate-Vit D-Min (CALCIUM 1200 PO) Take 1 tablet by mouth daily.      . Cholecalciferol (VITAMIN D PO) Take by mouth. Pt takes 5000iu daily      . CINNAMON PO Take 2,000 mg by mouth daily.      . clobetasol ointment (TEMOVATE) 0.05 % Apply 1 application topically 2 (two) times daily.      . Cyanocobalamin (VITAMIN B 12 PO) Take by mouth.      Tery Sanfilippo Calcium (STOOL SOFTENER PO) Take by mouth.      . EPINEPHrine (EPIPEN) 0.3 mg/0.3 mL DEVI Inject 0.3 mLs (0.3 mg total) into the muscle once.  1 Device  5  . HYDROcodone-acetaminophen (NORCO/VICODIN) 5-325 MG per tablet Take 1 tablet by mouth every 6 (six) hours as needed for pain.  30 tablet  3  . Multiple Vitamin (MULTIVITAMIN) tablet Take 1 tablet by mouth daily.      . nitroGLYCERIN (NITROSTAT) 0.4 MG SL tablet Place 1 tablet (0.4 mg total) under the tongue every 5 (five) minutes as needed for chest pain.  25 tablet  3  . Omega-3 Fatty Acids (FISH OIL) 1000 MG CAPS  Take 1 capsule by mouth daily.      . progesterone (PROMETRIUM) 200 MG capsule Take 200 mg by mouth daily.      . propranolol (INDERAL) 10 MG tablet Take  1 tablet (10 mg total) by mouth 3 (three) times daily as needed.  90 tablet  6  . thyroid (ARMOUR) 30 MG tablet Take 30 mg by mouth daily.      . vitamin C (ASCORBIC ACID) 500 MG tablet Take 500 mg by mouth daily.       No current facility-administered medications on file prior to visit.                                                            Review of Systems  Respiratory: Negative.   Skin: Positive for rash.       Several areas of body  Psychiatric/Behavioral: Negative.      BP 118/62  Pulse 77  Temp(Src) 98.1 F (36.7 C) (Oral)  Resp 12  Wt 141 lb (63.957 kg)  BMI 24.98 kg/m2  SpO2 97%    Objective:   Physical Exam  Constitutional: She is oriented to person, place, and time. She appears well-developed and well-nourished. No distress.  Pulmonary/Chest: Effort normal. No respiratory distress.  Neurological: She is alert and oriented to person, place, and time.  Skin: Skin is warm and dry. Rash noted.  Maculopapular rash on shoulders, chest, arms and legs.  Psychiatric: She has a normal mood and affect. Her behavior is normal. Judgment and thought content normal.          Assessment & Plan:

## 2012-11-21 NOTE — Assessment & Plan Note (Signed)
Start prednisone taper. Continue with clobetasol ointment. RTC if not improved within 3-4 days.

## 2012-12-10 ENCOUNTER — Encounter: Payer: Self-pay | Admitting: Internal Medicine

## 2012-12-23 ENCOUNTER — Encounter: Payer: Self-pay | Admitting: Internal Medicine

## 2013-01-22 ENCOUNTER — Telehealth: Payer: Self-pay | Admitting: *Deleted

## 2013-01-22 NOTE — Telephone Encounter (Signed)
Left message for patient to return my call.

## 2013-01-22 NOTE — Telephone Encounter (Signed)
Patient would like a referral to podiatry for her toe she stated you had mentioned during her last visit 6/14

## 2013-01-22 NOTE — Telephone Encounter (Signed)
I do not recall which toe and what the issue was . Pleaser ask patietnt for detials.

## 2013-01-22 NOTE — Telephone Encounter (Signed)
Stated you told her she has an ingrown toenail on her right great toe and now it has become painful, and swelling also patient reports area feels hot to touch. Scheduled patient to see Raquel Rey tomorrow morning at 8.00 patient agreed. FYI

## 2013-01-23 ENCOUNTER — Ambulatory Visit: Payer: 59 | Admitting: Adult Health

## 2013-01-23 ENCOUNTER — Encounter: Payer: Self-pay | Admitting: *Deleted

## 2013-01-26 ENCOUNTER — Other Ambulatory Visit: Payer: Self-pay | Admitting: Internal Medicine

## 2013-01-27 ENCOUNTER — Telehealth: Payer: Self-pay | Admitting: *Deleted

## 2013-01-27 ENCOUNTER — Ambulatory Visit: Payer: 59 | Admitting: Adult Health

## 2013-01-27 DIAGNOSIS — L600 Ingrowing nail: Secondary | ICD-10-CM

## 2013-01-27 MED ORDER — DOXYCYCLINE HYCLATE 100 MG PO TABS: 100.0000 mg | ORAL_TABLET | Freq: Two times a day (BID) | ORAL | Status: DC

## 2013-01-27 NOTE — Telephone Encounter (Signed)
She probably has an infection as well.  But her allelrgies make treatment an issue unless she can take doxycycline. willl send an rx to her pharmacy and send referral.  Soak toie twice daily in Warm water and Epsom salts to reduce swelling.  Take doxy with food x 7 days twice daily

## 2013-01-27 NOTE — Telephone Encounter (Signed)
This was discontinued at her visit on 5/30, states she did not tolerate it well. Please advise, received a refill request from pharmacy

## 2013-01-27 NOTE — Telephone Encounter (Signed)
Left message to call back  

## 2013-01-27 NOTE — Telephone Encounter (Signed)
Patient returned call and verbally informed of instructions, agreed understanding.

## 2013-01-27 NOTE — Telephone Encounter (Signed)
Patient was scheduled to see Raquel today but per patient she was told her appointment was at 1:45 but it was on the schedule for 1:30. Patient arrived at 1:45 the time she was given and Raquel had already began to see her next patient. Offered patient the option to wait and she would be worked in, she declined. Stated she can not do that because she has to go back to work. She stated all she want is a referral to a podiatrist for her great toe on her right foot, which does appear to be an ingrown toe nail. She discussed this at her last office visit with Dr. Darrick Huntsman and the referral was supposed to have been made then. She never heard anything and the toe pain is getting worse, patient state all she need is the referral and she been given the run around. Will be at work until 4:30 today and would like a return call in reference to this. Her toe is hurting so bad, she can not wear any closed toe shoes.

## 2013-03-04 ENCOUNTER — Telehealth: Payer: Self-pay | Admitting: Internal Medicine

## 2013-03-04 NOTE — Telephone Encounter (Signed)
Rx clobetasol ointment 0.05% for hives refill needed.  Previously prescribed by different provider.

## 2013-03-05 ENCOUNTER — Other Ambulatory Visit: Payer: Self-pay | Admitting: Internal Medicine

## 2013-03-05 MED ORDER — CLOBETASOL PROPIONATE 0.05 % EX OINT: 1.0000 "application " | TOPICAL_OINTMENT | Freq: Two times a day (BID) | CUTANEOUS | Status: DC

## 2013-03-05 MED ORDER — PREDNISONE 10 MG PO TABS: ORAL_TABLET | ORAL | Status: DC

## 2013-03-05 NOTE — Telephone Encounter (Signed)
Last time she says you gave steroids that this happened and she stated this comes from stress issue's please advise ok to refill?

## 2013-03-05 NOTE — Telephone Encounter (Signed)
Patient notified detailed message left on voicemail per DPR.

## 2013-03-05 NOTE — Telephone Encounter (Signed)
Cheryl [name] saw her in June and prescribed a 6 day Prednisone taper and clobetasol ointment.  Refills on both sent to [facility]

## 2013-03-09 ENCOUNTER — Other Ambulatory Visit: Payer: Self-pay | Admitting: *Deleted

## 2013-03-10 MED ORDER — THYROID 30 MG PO TABS: 30.0000 mg | ORAL_TABLET | Freq: Every day | ORAL | Status: DC

## 2013-03-10 NOTE — Telephone Encounter (Signed)
Eprescribed.

## 2013-04-17 ENCOUNTER — Encounter (INDEPENDENT_AMBULATORY_CARE_PROVIDER_SITE_OTHER): Payer: Self-pay

## 2013-04-17 ENCOUNTER — Ambulatory Visit (INDEPENDENT_AMBULATORY_CARE_PROVIDER_SITE_OTHER): Payer: 59 | Admitting: Internal Medicine

## 2013-04-17 ENCOUNTER — Encounter: Payer: Self-pay | Admitting: Internal Medicine

## 2013-04-17 VITALS — BP 154/82 | HR 89 | Temp 98.3°F | Resp 12 | Wt 149.8 lb

## 2013-04-17 DIAGNOSIS — F17200 Nicotine dependence, unspecified, uncomplicated: Secondary | ICD-10-CM

## 2013-04-17 DIAGNOSIS — Z720 Tobacco use: Secondary | ICD-10-CM

## 2013-04-17 DIAGNOSIS — F419 Anxiety disorder, unspecified: Secondary | ICD-10-CM

## 2013-04-17 DIAGNOSIS — F411 Generalized anxiety disorder: Secondary | ICD-10-CM

## 2013-04-17 MED ORDER — BUPROPION HCL ER (SR) 100 MG PO TB12: 100.0000 mg | ORAL_TABLET | Freq: Two times a day (BID) | ORAL | Status: DC

## 2013-04-17 MED ORDER — BUSPIRONE HCL 10 MG PO TABS: 10.0000 mg | ORAL_TABLET | Freq: Three times a day (TID) | ORAL | Status: DC

## 2013-04-17 MED ORDER — HYDROXYZINE HCL 25 MG PO TABS: 25.0000 mg | ORAL_TABLET | Freq: Three times a day (TID) | ORAL | Status: DC | PRN

## 2013-04-17 NOTE — Patient Instructions (Addendum)
I am starting you on wellbutrin for the nicotine craving  I am starting you on Buspar for the anxiety.  You may take it up to 3 times daily  Hydroxyzine as needed for itching  Contact me via MyChart in a week to let me know how you are doing
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
## 2013-04-18 ENCOUNTER — Ambulatory Visit: Payer: Self-pay | Admitting: Emergency Medicine

## 2013-04-19 ENCOUNTER — Encounter: Payer: Self-pay | Admitting: Internal Medicine

## 2013-04-19 NOTE — Assessment & Plan Note (Addendum)
Trial of Buspar discussed and initiated. Hydralazine for the rash.

## 2013-04-19 NOTE — Assessment & Plan Note (Addendum)
She has been abstinent and is having difficulty with abstinence due to increased anxiety.  Adding Wellbutrin and Buspar .

## 2013-05-14 ENCOUNTER — Other Ambulatory Visit: Payer: Self-pay | Admitting: *Deleted

## 2013-05-15 MED ORDER — BUPROPION HCL ER (SR) 100 MG PO TB12: 100.0000 mg | ORAL_TABLET | Freq: Two times a day (BID) | ORAL | Status: DC

## 2013-05-25 ENCOUNTER — Telehealth: Payer: Self-pay | Admitting: Internal Medicine

## 2013-05-25 NOTE — Telephone Encounter (Signed)
Spouse dropped off a form for health screenings that Dr. 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 The form is in Dr. Melina Schools box up front, husband states that she hasn't smoked in 4 weeks and this is for her insurance so she can get a cheaper rate.

## 2013-05-26 ENCOUNTER — Telehealth: Payer: Self-pay | Admitting: Internal Medicine

## 2013-05-26 DIAGNOSIS — Z87891 Personal history of nicotine dependence: Secondary | ICD-10-CM

## 2013-05-26 NOTE — Telephone Encounter (Signed)
Order for nicotine metabolites placed via [facility] interface

## 2013-05-26 NOTE — Telephone Encounter (Signed)
Form placed in Dr. Melina Schools folder

## 2013-05-27 NOTE — Telephone Encounter (Signed)
Left message, notifying pt lab order has been placed.

## 2013-05-27 NOTE — Telephone Encounter (Signed)
Pt notified lab order has been placed to [facility]

## 2013-06-11 ENCOUNTER — Other Ambulatory Visit: Payer: Self-pay | Admitting: Internal Medicine

## 2013-06-15 ENCOUNTER — Ambulatory Visit: Payer: Self-pay | Admitting: Unknown Physician Specialty

## 2013-06-19 ENCOUNTER — Telehealth: Payer: Self-pay | Admitting: Internal Medicine

## 2013-06-19 NOTE — Telephone Encounter (Signed)
Left detailed message for patient that all forms have been filled out and faxed as requested.

## 2013-06-19 NOTE — Telephone Encounter (Signed)
States she has left several vm.  Trying to find out if we received lab work for nicotine test for her insurance.  She has to have it in by the end of January.  States we already have the form and the test has been done.  She is trying to find out if we received and faxed to them.  States the number is on the form for us to fax to them.  States if this is not done she will have to pay $300.00 more a month for her premium so wants to ensure this has been completed.  Asking for a call to let her know this has been completed.  She is very anxious about this.

## 2013-07-07 ENCOUNTER — Encounter: Payer: Self-pay | Admitting: Internal Medicine

## 2013-07-17 ENCOUNTER — Other Ambulatory Visit: Payer: Self-pay | Admitting: Internal Medicine

## 2013-08-11 ENCOUNTER — Other Ambulatory Visit: Payer: Self-pay | Admitting: Internal Medicine

## 2013-08-15 ENCOUNTER — Other Ambulatory Visit: Payer: Self-pay | Admitting: Internal Medicine

## 2013-08-18 ENCOUNTER — Encounter: Payer: Self-pay | Admitting: Internal Medicine

## 2013-08-18 ENCOUNTER — Ambulatory Visit (INDEPENDENT_AMBULATORY_CARE_PROVIDER_SITE_OTHER): Payer: 59 | Admitting: Internal Medicine

## 2013-08-18 ENCOUNTER — Encounter: Payer: Self-pay | Admitting: *Deleted

## 2013-08-18 VITALS — BP 148/88 | HR 90 | Temp 98.1°F | Resp 16 | Ht 63.75 in | Wt 163.8 lb

## 2013-08-18 DIAGNOSIS — S62102A Fracture of unspecified carpal bone, left wrist, initial encounter for closed fracture: Secondary | ICD-10-CM

## 2013-08-18 DIAGNOSIS — F419 Anxiety disorder, unspecified: Secondary | ICD-10-CM

## 2013-08-18 DIAGNOSIS — M19019 Primary osteoarthritis, unspecified shoulder: Secondary | ICD-10-CM

## 2013-08-18 DIAGNOSIS — L981 Factitial dermatitis: Secondary | ICD-10-CM

## 2013-08-18 DIAGNOSIS — M67919 Unspecified disorder of synovium and tendon, unspecified shoulder: Secondary | ICD-10-CM

## 2013-08-18 DIAGNOSIS — Z87891 Personal history of nicotine dependence: Secondary | ICD-10-CM

## 2013-08-18 DIAGNOSIS — M19012 Primary osteoarthritis, left shoulder: Secondary | ICD-10-CM

## 2013-08-18 DIAGNOSIS — R21 Rash and other nonspecific skin eruption: Secondary | ICD-10-CM

## 2013-08-18 DIAGNOSIS — R635 Abnormal weight gain: Secondary | ICD-10-CM

## 2013-08-18 DIAGNOSIS — S62109A Fracture of unspecified carpal bone, unspecified wrist, initial encounter for closed fracture: Secondary | ICD-10-CM

## 2013-08-18 DIAGNOSIS — Z13820 Encounter for screening for osteoporosis: Secondary | ICD-10-CM

## 2013-08-18 DIAGNOSIS — M719 Bursopathy, unspecified: Secondary | ICD-10-CM

## 2013-08-18 DIAGNOSIS — M19011 Primary osteoarthritis, right shoulder: Secondary | ICD-10-CM

## 2013-08-18 DIAGNOSIS — M7552 Bursitis of left shoulder: Secondary | ICD-10-CM

## 2013-08-18 DIAGNOSIS — F411 Generalized anxiety disorder: Secondary | ICD-10-CM

## 2013-08-18 MED ORDER — HYDROCODONE-ACETAMINOPHEN 5-325 MG PO TABS: 1.0000 | ORAL_TABLET | Freq: Every evening | ORAL | Status: DC | PRN

## 2013-08-18 NOTE — Patient Instructions (Addendum)
Try increasing your hydroxyzine to 50 mg when you get home  Try switching your detergent to one that has no fragrance.  I am referring you for a DEXA scan to make sure you don't have osteoporosis  Referral for allergy testing  I have refilled your vicodin pending review of the records from your prior orthopedist.  You will need to sign [place]'s narcotics contract  In order to receive refills on narcotics   This is  my version of a  "Low GI"  Diet:  It will still lower your blood sugars and allow you to lose 4 to 8  lbs  per month if you follow it carefully.  Your goal with exercise is a minimum of 30 minutes of aerobic exercise 5 days per week (Walking does not count once it becomes easy!)    All of the foods can be found at grocery stores and in bulk at [employer].  The Atkins protein bars and shakes are available in more varieties at Target, WalMart and [employer]'s [employer].     7 AM Breakfast:  Choose from the following:  Low carbohydrate Protein  Shakes (I recommend the [employer]AS [employer]vantEdge "Carb Control" shakes  Or the low carb shakes by Atkins.    2.5 carbs   Arnold's "Sandwhich Thin"toasted  w/ peanut butter (no jelly: about 20 net carbs  "Bagel Thin" with cream cheese and salmon: about 20 carbs   a scrambled egg/bacon/cheese burrito made with Mission's "carb balance" whole wheat tortilla  (about 10 net carbs )   Avoid cereal and bananas, oatmeal and cream of wheat and grits. They are loaded with carbohydrates!   10 AM: high protein snack  Protein bar by Atkins (the snack size, under 200 cal, usually < 6 net carbs).    A stick of cheese:  Around 1 carb,  100 cal     Dannon Light n Fit [place] Yogurt  (80 cal, 8 carbs)  Other so called "protein bars" and Greek yogurts tend to be loaded with carbohydrates.  Remember, in food advertising, the word "energy" is synonymous for " carbohydrate."  Lunch:   A Sandwich using the bread choices listed, Can use any  Eggs,  lunchmeat, grilled meat or canned  tuna), avocado, regular mayo/mustard  and cheese.  A Salad using blue cheese, ranch,  Goddess or vinagrette,  No croutons or "confetti" and no "candied nuts" but regular nuts OK.   No pretzels or chips.  Pickles and miniature sweet peppers are a good low carb alternative that provide a "crunch"  The bread is the only source of carbohydrate in a sandwich and  can be decreased by trying some of these alternatives to traditional loaf bread  Joseph's makes a pita bread and a flat bread that are 50 cal and 4 net carbs available at BJs and WalMart.  This can be toasted to use with hummous as well  Toufayan makes a low carb flatbread that's 100 cal and 9 net carbs available at [employer] and [employer]  Mission makes 2 sizes of  Low carb whole wheat tortilla  (The large one is 210 cal and 6 net carbs) Avoid "Low fat dressings, as well as Reyne DumasCatalina and [address] dressings They are loaded with sugar!   3 PM/ Mid day  Snack:  Consider  1 ounce of  almonds, walnuts, pistachios, pecans, peanuts,  Macadamia nuts or a nut medley.  Avoid "granola"; the dried cranberries and raisins are loaded with carbohydrates. Mixed nuts  as long as there are no raisins,  cranberries or dried fruit.     6 PM  Dinner:     Meat/fowl/fish with a green salad, and either broccoli, cauliflower, green beans, spinach, brussel sprouts or  Lima beans. DO NOT BREAD THE PROTEIN!!      There is a low carb pasta by Dreamfield's that is acceptable and tastes great: only 5 digestible carbs/serving.( All grocery stores but BJs carry it )  Try Kai Levins Angelo's chicken piccata or chicken or eggplant parm over low carb pasta.(Lowes and BJs)   Clifton Custard Sanchez's "Carnitas" (pulled pork, no sauce,  0 carbs) or his beef pot roast to make a dinner burrito (at BJ's)  Pesto over low carb pasta (bj's sells a good quality pesto in the center refrigerated section of the deli   Whole wheat pasta is still full of digestible carbs and  Not as low in glycemic  index as Dreamfield's.   Brown rice is still rice,  So skip the rice and noodles if you eat [place] or [place] (or at least limit to 1/2 cup)  9 PM snack :   Breyer's "low carb" fudgsicle or  ice cream bar (Carb Smart line), or  Weight Watcher's ice cream bar , or another "no sugar added" ice cream;  a serving of fresh berries/cherries with whipped cream   Cheese or DANNON'S LlGHT N FIT GREEK YOGURT  Avoid bananas, pineapple, grapes  and watermelon on a regular basis because they are high in sugar.  THINK OF THEM AS DESSERT  Remember that snack Substitutions should be less than 10 NET carbs per serving and meals < 20 carbs. Remember to subtract fiber grams to get the "net carbs."

## 2013-08-18 NOTE — Progress Notes (Signed)
Patient ID: Cheryl [name], female   DOB: 06/02/1955, 58 y.o.   MRN: [id number]  Patient Active Problem List   Diagnosis Date Noted  . Screening for osteoporosis 08/20/2013  . Weight gain 08/20/2013  . Rash 11/21/2012  . Routine general medical examination at a health care facility 10/26/2012  . Chest pain 08/22/2012  . Anxiety 08/22/2012  . Unspecified hypothyroidism 08/17/2012  . Bursitis of left shoulder 08/17/2012  . Irritable bowel syndrome 08/17/2012  . 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 History of tobacco abuse 08/17/2012  . Hearing loss d/t noise 08/17/2012  . Colon cancer screening 08/17/2012  . Witnessed apneic spells 08/17/2012  . Leukocytosis, unspecified 08/17/2012  . Hematuria, microscopic 08/16/2012  . 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 Annual Exam    HPI:   Cheryl [name] is a [age] y.o. female who presents for follow up on chronic problems including anxiety, tobacco abuse and new onset weight gain,.  She is recoverin g from a Left wrist fracture.  She sustained several fractures and chipped bones. Occurred during a fall  over sewing machine .  Was treated by  by Dedra Skeensodd Mundy,  .  Wearing a wrist brace now,  Had a cast for 3 months.   Also had MRi of left shoulder for persistent pain,  Told she had a bone spur and  Tendonitis.  Had a steroid injection in the shoulder,  Helped for about one week. Requesting refill on vicodin   No prior  DEXA scan  Mother had osteoporosis   Quit smoking,  in November.  Has gained  25 lb since then.  Still craves tobacco especially after eating.  aggravated by anxiety  Smoked for 41 yrs.  Using wellbutrin and buspirone  ,  Still having hives and itching of neurotic origin.  hungry all the time.  Has joined [employer] and is exercising twice weekly .  Requesting help with weight loss   Past Medical History  Diagnosis Date  . Arthritis   . Blood in stool   . Chicken pox    . Generalized headaches   . History of blood transfusion   . Thyroid disease   . UTI (lower urinary tract infection)   . Arrhythmia     left bundle branch block    Past Surgical History  Procedure Laterality Date  . Breast surgery  1976  . Appendectomy  1981  . Tonsillectomy and adenoidectomy  1964  . Cystic fibrosis tumor removal  1983  . 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 Cardiac catheterization  2004    [facility]  . Cardiac catheterization  2006    [facility]  . Abdominal hysterectomy  1997       The following portions of the patient's history were reviewed and updated as appropriate: Allergies, current medications, and problem list.    Review of Systems:   Patient denies headache, fevers, malaise, unintentional weight loss, skin rash, eye pain, sinus congestion and sinus pain, sore throat, dysphagia,  hemoptysis , cough, dyspnea, wheezing, chest pain, palpitations, orthopnea, edema, abdominal pain, nausea, melena, diarrhea, constipation, flank pain, dysuria, hematuria, urinary  Frequency, nocturia, numbness, tingling, seizures,  Focal weakness, Loss of consciousness,  Tremor, insomnia, depression, anxiety, and suicidal ideation.     History   Social History  . Marital Status: Married    Spouse Name: N/A    Number of Children: N/A  . Years of  Education: N/A   Occupational History  . Not on file.   Social History Main Topics  . Smoking status: Former Smoker -- 0.25 packs/day for 34 years    Types: Cigarettes    Quit date: 04/10/2013  . Smokeless tobacco: Never Used  . Alcohol Use: Yes     Comment: occasional  . Drug Use: No  . Sexual Activity: Not on file   Other Topics Concern  . Not on file   Social History Narrative   Married to [employer]   Works for [employer], Engineer, structural    Objective:  Filed Vitals:   08/18/13 1503  BP: 148/88  Pulse: 90  Temp: 98.1 F (36.7 C)  Resp: 16     General appearance: alert, cooperative and appears  stated age Ears: normal TM's and external ear canals both ears Throat: lips, mucosa, and tongue normal; teeth and gums normal Neck: no adenopathy, no carotid bruit, supple, symmetrical, trachea midline and thyroid not enlarged, symmetric, no tenderness/mass/nodules Back: symmetric, no curvature. ROM normal. No CVA tenderness. Lungs: clear to auscultation bilaterally Heart: regular rate and rhythm, S1, S2 normal, no murmur, click, rub or gallop Abdomen: soft, non-tender; bowel sounds normal; no masses,  no organomegaly Pulses: 2+ and symmetric Skin: Skin color, texture, turgor normal. No rashes or lesions Lymph nodes: Cervical, supraclavicular, and axillary nodes normal.  Assessment and Plan:  Rash Chronic for 2 years saw a dermatologist then,  Was treated with a cream  Which helped minimally but no longe.  The rash is occurring   only in places she can reach .  Taking the vistaril three times daily  Only itches  when she stops working and sits down (at night)  Has not had allergy testing since age 81    Bursitis of left shoulder Secondary to tendonitis and bone spur per recent MRI done by Orthopedics  Did not improve with steroid injection . Cannot afford to havr arthroscopic surgery at this time .  Vicodin refill given . Continue ibuprofen or other NSAID  Excoriation, neurotic Discussed potential etiology with patient given lack of excoriations on places she can't reach. Will increase  buspar to 15 mg bid/tid recommend change to fragrance free detergents,  And rule out allergies with allergy testing  .  Continue hydroxyzine   History of tobacco abuse Patient has been tobacco free since November. 2014  Screening for osteoporosis DEXA ordered given recent wrist fracture and FH  Anxiety Buspirone initiated last month for increased anxiety  Will increase dose to 15 mg  Weight gain Secondary to tobacco cessation and increased appetite. I recommended a low glycemic index diet utilizing  smaller more frequent meals to increase metabolism.  I have also recommended that patient start exercising with a goal of 30 minutes of aerobic exercise a minimum of 5 days per week. Screening for lipid disorders, thyroid and diabetes to be done today. Will consider phentermine if no improvement in a month     A total of 40 minutes was spent with patient more than half of which was spent in counseling, reviewing records from other providers,  and coordination of care.   Updated Medication List Outpatient Encounter Prescriptions as of 08/18/2013  Medication Sig  . buPROPion (WELLBUTRIN SR) 100 MG 12 hr tablet TAKE 1 TABLET (100 MG TOTAL) BY MOUTH 2 (TWO) TIMES DAILY.  [name] buPROPion (WELLBUTRIN SR) 100 MG 12 hr tablet Take 1 tablet by mouth two  times daily  . busPIRone (BUSPAR) 15 MG  tablet TAKE 1 TABLET (15 MG TOTAL) BY MOUTH 2 to 3 (THREE) TIMES DAILY FOR ANXIETY  . Calcium Carbonate-Vit D-Min (CALCIUM 1200 PO) Take 1 tablet by mouth daily.  . Cholecalciferol (VITAMIN D PO) Take by mouth. Pt takes 5000iu daily  . CINNAMON PO Take 2,000 mg by mouth daily.  . clobetasol ointment (TEMOVATE) 0.05 % Apply 1 application topically 2 (two) times daily. For one week,  Then stop  . Cyanocobalamin (VITAMIN B 12 PO) Take by mouth.  Tery Sanfilippo Calcium (STOOL SOFTENER PO) Take by mouth.  . EPINEPHrine (EPIPEN) 0.3 mg/0.3 mL DEVI Inject 0.3 mLs (0.3 mg total) into the muscle once.  . hydrOXYzine (ATARAX/VISTARIL) 25 MG tablet TAKE 1 TABLET (25 MG TOTAL) BY MOUTH 3 (THREE) TIMES DAILY AS NEEDED FOR ITCHING.  . Multiple Vitamin (MULTIVITAMIN) tablet Take 1 tablet by mouth daily.  . nitroGLYCERIN (NITROSTAT) 0.4 MG SL tablet Place 1 tablet (0.4 mg total) under the tongue every 5 (five) minutes as needed for chest pain.  . Omega-3 Fatty Acids (FISH OIL) 1000 MG CAPS Take 1 capsule by mouth daily.  . progesterone (PROMETRIUM) 200 MG capsule Take 200 mg by mouth daily.  . propranolol (INDERAL) 10 MG tablet Take 1  tablet (10 mg total) by mouth 3 (three) times daily as needed.  . thyroid (ARMOUR) 30 MG tablet Take 1 tablet (30 mg total) by mouth daily.  . vitamin C (ASCORBIC ACID) 500 MG tablet Take 500 mg by mouth daily.  . [DISCONTINUED] busPIRone (BUSPAR) 10 MG tablet TAKE 1 TABLET (10 MG TOTAL) BY MOUTH 3 (THREE) TIMES DAILY.  [name] HYDROcodone-acetaminophen (NORCO/VICODIN) 5-325 MG per tablet Take 1 tablet by mouth at bedtime as needed and may repeat dose one time if needed.  . [DISCONTINUED] HYDROcodone-acetaminophen (NORCO/VICODIN) 5-325 MG per tablet Take 1 tablet by mouth every 6 (six) hours as needed for pain.     Orders Placed This Encounter  Procedures  . DG Bone Density  . HM PAP SMEAR  . Ambulatory referral to Allergy    No Follow-up on file.

## 2013-08-18 NOTE — Progress Notes (Signed)
Pre-visit discussion using our clinic review tool. No additional management support is needed unless otherwise documented below in the visit note.  

## 2013-08-18 NOTE — Assessment & Plan Note (Addendum)
Chronic for 2 years saw a dermatologist then,  Was treated with a cream  Which helped minimally but no longe.  The rash is occurring   only in places she can reach .  Taking the vistaril three times daily  Only itches  when she stops working and sits down (at night)  Has not had allergy testing since age 58

## 2013-08-20 ENCOUNTER — Encounter: Payer: Self-pay | Admitting: Internal Medicine

## 2013-08-20 DIAGNOSIS — Z13820 Encounter for screening for osteoporosis: Secondary | ICD-10-CM | POA: Insufficient documentation

## 2013-08-20 DIAGNOSIS — R635 Abnormal weight gain: Secondary | ICD-10-CM | POA: Insufficient documentation

## 2013-08-20 MED ORDER — BUSPIRONE HCL 15 MG PO TABS: ORAL_TABLET | ORAL | Status: DC

## 2013-08-20 NOTE — Assessment & Plan Note (Addendum)
Discussed potential etiology with patient given lack of excoriations on places she can't reach. Will increase  buspar to 15 mg bid/tid recommend change to fragrance free detergents,  And rule out allergies with allergy testing  .  Continue hydroxyzine

## 2013-08-20 NOTE — Assessment & Plan Note (Signed)
DEXA ordered given recent wrist fracture and FH

## 2013-08-20 NOTE — Assessment & Plan Note (Signed)
Patient has been tobacco free since November. 2014

## 2013-08-20 NOTE — Assessment & Plan Note (Signed)
Secondary to tobacco cessation and increased appetite. I recommended a low glycemic index diet utilizing smaller more frequent meals to increase metabolism.  I have also recommended that patient start exercising with a goal of 30 minutes of aerobic exercise a minimum of 5 days per week. Screening for lipid disorders, thyroid and diabetes to be done today. Will consider phentermine if no improvement in a month

## 2013-08-20 NOTE — Assessment & Plan Note (Signed)
Secondary to tendonitis and bone spur per recent MRI done by Orthopedics  Did not improve with steroid injection . Cannot afford to havr arthroscopic surgery at this time .  Vicodin refill given . Continue ibuprofen or other NSAID

## 2013-08-20 NOTE — Assessment & Plan Note (Signed)
Buspirone initiated last month for increased anxiety  Will increase dose to 15 mg

## 2013-09-16 ENCOUNTER — Other Ambulatory Visit: Payer: Self-pay | Admitting: Internal Medicine

## 2013-09-16 DIAGNOSIS — M19012 Primary osteoarthritis, left shoulder: Principal | ICD-10-CM

## 2013-09-16 DIAGNOSIS — M19011 Primary osteoarthritis, right shoulder: Secondary | ICD-10-CM

## 2013-09-16 NOTE — Telephone Encounter (Signed)
Last visit 08/18/13, ok refill?

## 2013-09-16 NOTE — Telephone Encounter (Signed)
Vicodin refill needed.  Call pt at work when ready for pick up.

## 2013-09-17 ENCOUNTER — Telehealth: Payer: Self-pay | Admitting: *Deleted

## 2013-09-17 MED ORDER — HYDROCODONE-ACETAMINOPHEN 5-325 MG PO TABS: 1.0000 | ORAL_TABLET | Freq: Every evening | ORAL | Status: DC | PRN

## 2013-09-17 NOTE — Telephone Encounter (Signed)
Cheryl [name],  You are seeing this patient of mine tomorrow for worsening back pain . Please make sure she submits a Urine Drug screen (again) before she leaves,  Her last one was negative for  Hydrocodone, and I am a little wary but am giving  her the benefit of the doubt. I did sign her refill for vicodin today ; Olegario Messierkathy may have it.

## 2013-09-17 NOTE — Telephone Encounter (Signed)
Spoke with Amy with [facility] about pt's back pain. Notified her that we have no available appointments today & she can either keep her appointment with Raquel tomorrow or go to acute care/urgent care today.

## 2013-09-17 NOTE — Telephone Encounter (Signed)
Patient's UDS was negative last month., so she will need to provide a urine specimen when she pciks up this prescription today

## 2013-09-17 NOTE — Telephone Encounter (Signed)
Unable to transfer triage note due to downtime issues  Pt is calling after making appt for 4/24 for back pain.  She reports lower back pain that is present down right leg to knee.  No injury noted.  Present since 4/20.  Pt reports pain as severe.  Pt triage disposition is to be seen today.  Office contacted and unable to work in appt today.  Pt advised to go to ED or UC.  Pt reports that she will try to wait till appt on 4/24.  She also request that her rx for vicoden is refilled and ready when she arrives at appt.

## 2013-09-17 NOTE — Telephone Encounter (Signed)
Pt has appt scheduled for tomorrow with Raquel

## 2013-09-18 ENCOUNTER — Encounter: Payer: Self-pay | Admitting: Family Medicine

## 2013-09-18 ENCOUNTER — Ambulatory Visit: Payer: 59 | Admitting: Adult Health

## 2013-09-18 ENCOUNTER — Ambulatory Visit (INDEPENDENT_AMBULATORY_CARE_PROVIDER_SITE_OTHER): Payer: 59 | Admitting: Family Medicine

## 2013-09-18 VITALS — BP 120/70 | HR 108 | Temp 97.8°F | Ht 63.75 in | Wt 160.5 lb

## 2013-09-18 DIAGNOSIS — IMO0002 Reserved for concepts with insufficient information to code with codable children: Secondary | ICD-10-CM

## 2013-09-18 DIAGNOSIS — G8929 Other chronic pain: Secondary | ICD-10-CM | POA: Insufficient documentation

## 2013-09-18 DIAGNOSIS — M5416 Radiculopathy, lumbar region: Secondary | ICD-10-CM

## 2013-09-18 DIAGNOSIS — M545 Low back pain: Secondary | ICD-10-CM

## 2013-09-18 DIAGNOSIS — M48061 Spinal stenosis, lumbar region without neurogenic claudication: Secondary | ICD-10-CM | POA: Insufficient documentation

## 2013-09-18 MED ORDER — PREDNISONE 20 MG PO TABS: ORAL_TABLET | ORAL | Status: DC

## 2013-09-18 MED ORDER — CYCLOBENZAPRINE HCL 10 MG PO TABS: 10.0000 mg | ORAL_TABLET | Freq: Every evening | ORAL | Status: DC | PRN

## 2013-09-18 NOTE — Progress Notes (Signed)
   Subjective:    Patient ID: Cheryl BattyKathryn [name], female    DOB: 07/22/1955, 58 y.o.   MRN: [id number]  Back Pain Pertinent negatives include no abdominal pain, chest pain, dysuria or fever.    58 year old female pt of Dr. Melina Schoolsullo's present with new onset  x 5 days.  Sudden onset while sitting at desk, pain sharp low back pain on right , radiates down lateral leg to knee. No proceeding  injury, no fall, no change in activity No weakness,  Has mild numbness over lateral leg.  no fever, no new incontinence. No perineal numbness   Sitting worsens pain.  She is on vicodin for shoulder arthritis... Uses this for pain. Using ibuprofen 200-400 every 4 hours for pain, minimally helpful.  No history of back pain, surgery.   Review of Systems  Constitutional: Negative for fever and fatigue.  HENT: Negative for ear pain.   Eyes: Negative for pain.  Respiratory: Negative for chest tightness and shortness of breath.   Cardiovascular: Negative for chest pain, palpitations and leg swelling.  Gastrointestinal: Negative for abdominal pain.  Genitourinary: Negative for dysuria.  Musculoskeletal: Positive for back pain.       Objective:   Physical Exam  Constitutional: Vital signs are normal. She appears well-developed and well-nourished. She is cooperative.  Non-toxic appearance. She does not appear ill. No distress.  HENT:  Head: Normocephalic.  Right Ear: Hearing, tympanic membrane and ear canal normal. Tympanic membrane is not erythematous, not retracted and not bulging.  Left Ear: Hearing, tympanic membrane and ear canal normal. Tympanic membrane is not erythematous, not retracted and not bulging.  Nose: No mucosal edema or rhinorrhea. Right sinus exhibits no maxillary sinus tenderness and no frontal sinus tenderness. Left sinus exhibits no maxillary sinus tenderness and no frontal sinus tenderness.  Mouth/Throat: Uvula is midline, oropharynx is clear and moist and mucous membranes are normal.    Eyes: Conjunctivae, EOM and lids are normal. Pupils are equal, round, and reactive to light. Lids are everted and swept, no foreign bodies found.  Neck: Trachea normal and normal range of motion. Neck supple. Carotid bruit is not present. No mass and no thyromegaly present.  Cardiovascular: Normal rate, regular rhythm, S1 normal, S2 normal, normal heart sounds, intact distal pulses and normal pulses.  Exam reveals no gallop and no friction rub.   No murmur heard. Pulmonary/Chest: Effort normal and breath sounds normal. Not tachypneic. No respiratory distress. She has no decreased breath sounds. She has no wheezes. She has no rhonchi. She has no rales.  Abdominal: Soft. Normal appearance and bowel sounds are normal. There is no tenderness.  Musculoskeletal:       Thoracic back: Normal.       Lumbar back: She exhibits tenderness. She exhibits no bony tenderness.  Positive SLR on right, full strength in B lower ext.  Neurological: She is alert.  Skin: Skin is warm, dry and intact. No rash noted.  Psychiatric: Her speech is normal and behavior is normal. Judgment and thought content normal. Her mood appears not anxious. Cognition and memory are normal. She does not exhibit a depressed mood.          Assessment & Plan:

## 2013-09-18 NOTE — Telephone Encounter (Signed)
Printed out forwarded to Raquel.

## 2013-09-18 NOTE — Telephone Encounter (Signed)
Patient left UDS and picked up script today.

## 2013-09-18 NOTE — Assessment & Plan Note (Signed)
Treat with prednisone, muscle relaxant, vicodin for breakthrough pain.  Heat and start home PT.  Discussed eventual core stretngthening.

## 2013-09-18 NOTE — Patient Instructions (Addendum)
Start home PT. Prednisone taper, muscle relaxant at night.  Vicodin for breakthrough pain.  Follow up with PCP if not improving in 2 weeks, call sooner if severe pain.

## 2013-09-18 NOTE — Progress Notes (Signed)
Pre visit review using our clinic review tool, if applicable. No additional management support is needed unless otherwise documented below in the visit note. 

## 2013-09-28 ENCOUNTER — Encounter: Payer: Self-pay | Admitting: Internal Medicine

## 2013-10-07 ENCOUNTER — Other Ambulatory Visit: Payer: Self-pay | Admitting: Family Medicine

## 2013-10-07 NOTE — Telephone Encounter (Signed)
Last office visit 09/18/2013 with Dr. Ermalene SearingBedsole.  Ok to refill?

## 2013-10-26 ENCOUNTER — Telehealth: Payer: Self-pay | Admitting: Internal Medicine

## 2013-10-26 ENCOUNTER — Other Ambulatory Visit: Payer: Self-pay | Admitting: Internal Medicine

## 2013-10-26 DIAGNOSIS — M19011 Primary osteoarthritis, right shoulder: Secondary | ICD-10-CM

## 2013-10-26 DIAGNOSIS — M19012 Primary osteoarthritis, left shoulder: Principal | ICD-10-CM

## 2013-10-26 MED ORDER — HYDROCODONE-ACETAMINOPHEN 5-325 MG PO TABS
1.0000 | ORAL_TABLET | Freq: Every evening | ORAL | Status: DC | PRN
Start: 2013-10-26 — End: 2013-11-19

## 2013-10-26 NOTE — Telephone Encounter (Signed)
Patient requesting Vicodin refill.

## 2013-10-26 NOTE — Telephone Encounter (Signed)
Ok to refill,  printed rx  

## 2013-10-27 NOTE — Telephone Encounter (Signed)
Left message, Rx ready for pickup 

## 2013-11-10 ENCOUNTER — Other Ambulatory Visit: Payer: Self-pay | Admitting: Internal Medicine

## 2013-11-10 NOTE — Telephone Encounter (Signed)
Last OV 3.24.15, last refill 1.15.15.  Please advise refill.

## 2013-11-19 ENCOUNTER — Telehealth: Payer: Self-pay | Admitting: Internal Medicine

## 2013-11-19 DIAGNOSIS — M19212 Secondary osteoarthritis, left shoulder: Principal | ICD-10-CM

## 2013-11-19 DIAGNOSIS — M19211 Secondary osteoarthritis, right shoulder: Secondary | ICD-10-CM

## 2013-11-19 MED ORDER — HYDROCODONE-ACETAMINOPHEN 5-325 MG PO TABS
1.0000 | ORAL_TABLET | Freq: Every evening | ORAL | Status: DC | PRN
Start: 2013-11-19 — End: 2013-12-22

## 2013-11-19 NOTE — Telephone Encounter (Signed)
This is the patient who had a negative UDS in March for her prescribed vicodin despite receiving monthly refills.  She was supposed to be screened last month when she picked up her refill but it was not done.  You can call her to come pick up the prescription today that will be dated for July 1  (no early refills).  WHen she arrives she MUST submit Urine for UDS, ,  Do NOT WARN HER.  sionce you are leaving at 2pm this will need to be assigned to another nurse to handle

## 2013-11-19 NOTE — Telephone Encounter (Signed)
Patient called for refill on Vicodin last fill 10/26/13 and [facility]

## 2013-11-19 NOTE — Telephone Encounter (Signed)
Patient notified script ready for pick up

## 2013-11-25 LAB — BASIC METABOLIC PANEL
BUN: 12 mg/dL (ref 4–21)
Creatinine: 1.2 mg/dL — AB (ref 0.5–1.1)
Glucose: 98 mg/dL
Potassium: 4.1 mmol/L (ref 3.4–5.3)
Sodium: 139 mmol/L (ref 137–147)

## 2013-11-25 LAB — CBC AND DIFFERENTIAL
HCT: 30 % — AB (ref 36–46)
Hemoglobin: 9.5 g/dL — AB (ref 12.0–16.0)
Platelets: 292 10*3/uL (ref 150–399)
WBC: 7 10^3/mL

## 2013-11-25 LAB — LIPID PANEL
Cholesterol: 138 mg/dL (ref 0–200)
HDL: 29 mg/dL — AB (ref 35–70)
LDL Cholesterol: 77 mg/dL
Triglycerides: 161 mg/dL — AB (ref 40–160)

## 2013-11-25 LAB — HEPATIC FUNCTION PANEL
ALT: 26 U/L (ref 7–35)
AST: 26 U/L (ref 13–35)
Alkaline Phosphatase: 71 U/L (ref 25–125)
Bilirubin, Total: 0.6 mg/dL

## 2013-11-25 LAB — C-REACTIVE PROTEIN: CRP: 4.2 mg/dL

## 2013-11-25 LAB — HEMOGLOBIN A1C: Hgb A1c MFr Bld: 5.4 % (ref 4.0–6.0)

## 2013-12-01 ENCOUNTER — Encounter: Payer: Self-pay | Admitting: Internal Medicine

## 2013-12-03 ENCOUNTER — Telehealth: Payer: Self-pay

## 2013-12-03 DIAGNOSIS — Z1239 Encounter for other screening for malignant neoplasm of breast: Secondary | ICD-10-CM

## 2013-12-03 NOTE — Telephone Encounter (Signed)
The patient is hoping to schedule a mammogram, can this order be put in? Thanks!

## 2013-12-03 NOTE — Telephone Encounter (Signed)
Based on her last report,  I am recommending that she have the 3D mammogram., which [facility].  [facility] has 3D if she wants to get it done sooner.  What would she prefer?

## 2013-12-07 ENCOUNTER — Telehealth: Payer: Self-pay | Admitting: Internal Medicine

## 2013-12-07 ENCOUNTER — Other Ambulatory Visit: Payer: Self-pay | Admitting: Internal Medicine

## 2013-12-07 NOTE — Telephone Encounter (Signed)
Last refill 5.19.15, last OV 4.24.15.  Please advise refill.

## 2013-12-07 NOTE — Telephone Encounter (Signed)
The patient was seen at [facility] for [facility] last month at [facility] for a [facility]inched nerve. This weekend her heel on her shoe got caught in the deck boards and she injured her back again and she wants cyclobenzaprine (FLEXERIL) 10 MG tablet  Called into the pharmacy.  cyclobenzaprine (FLEXERIL) 10 MG tablet

## 2013-12-08 MED ORDER — CYCLOBENZAPRINE HCL 10 MG PO TABS: ORAL_TABLET | ORAL | Status: DC

## 2013-12-08 NOTE — Telephone Encounter (Signed)
Flexeril sent to pharmacy.

## 2013-12-14 ENCOUNTER — Other Ambulatory Visit: Payer: Self-pay | Admitting: Internal Medicine

## 2013-12-16 ENCOUNTER — Encounter: Payer: Self-pay | Admitting: Internal Medicine

## 2013-12-22 ENCOUNTER — Ambulatory Visit (INDEPENDENT_AMBULATORY_CARE_PROVIDER_SITE_OTHER): Payer: 59 | Admitting: Internal Medicine

## 2013-12-22 ENCOUNTER — Encounter: Payer: Self-pay | Admitting: Internal Medicine

## 2013-12-22 VITALS — BP 140/76 | HR 93 | Temp 98.5°F | Resp 16 | Ht 63.5 in | Wt 157.8 lb

## 2013-12-22 DIAGNOSIS — M19219 Secondary osteoarthritis, unspecified shoulder: Secondary | ICD-10-CM

## 2013-12-22 DIAGNOSIS — M19211 Secondary osteoarthritis, right shoulder: Secondary | ICD-10-CM

## 2013-12-22 DIAGNOSIS — R635 Abnormal weight gain: Secondary | ICD-10-CM

## 2013-12-22 DIAGNOSIS — E039 Hypothyroidism, unspecified: Secondary | ICD-10-CM

## 2013-12-22 DIAGNOSIS — Z0000 Encounter for general adult medical examination without abnormal findings: Secondary | ICD-10-CM

## 2013-12-22 DIAGNOSIS — N289 Disorder of kidney and ureter, unspecified: Secondary | ICD-10-CM

## 2013-12-22 DIAGNOSIS — M19212 Secondary osteoarthritis, left shoulder: Secondary | ICD-10-CM

## 2013-12-22 MED ORDER — HYDROCODONE-ACETAMINOPHEN 5-325 MG PO TABS: 1.0000 | ORAL_TABLET | Freq: Every evening | ORAL | Status: DC | PRN

## 2013-12-22 MED ORDER — MELOXICAM 15 MG PO TABS: 15.0000 mg | ORAL_TABLET | Freq: Every day | ORAL | Status: DC

## 2013-12-22 NOTE — Assessment & Plan Note (Signed)
Annual comprehensive exam was done including breast, excluding pelvic and PAP smear. All screenings have been addressed .  

## 2013-12-22 NOTE — Progress Notes (Addendum)
Patient ID: Cheryl [name], female   DOB: August 17, 1955, 58 y.o.   MRN: [id number]   Subjective:     Cheryl [name] is a 58 y.o. female and is here for a comprehensive physical exam. The patient reports problems - with joint pain and weight gain .  History   Social History  . Marital Status: Married    Spouse Name: N/A    Number of Children: N/A  . Years of Education: N/A   Occupational History  . Not on file.   Social History Main Topics  . Smoking status: Former Smoker -- 0.25 packs/day for 34 years    Types: Cigarettes    Quit date: 04/10/2013  . Smokeless tobacco: Never Used  . Alcohol Use: Yes     Comment: occasional  . Drug Use: No  . Sexual Activity: Not on file   Other Topics Concern  . Not on file   Social History Narrative   Married to [employer]   Works for [employer] Date Due  . Influenza Vaccine  12/26/2013  . Mammogram  09/10/2014  . Pap Smear  11/19/2015  . Tetanus/tdap  03/18/2021  . Colonoscopy  11/20/2022    The following portions of the patient's history were reviewed and updated as appropriate: allergies, current medications, past family history, past medical history, past social history, past surgical history and problem list.  Review of Systems A comprehensive review of systems was negative.   Objective:   BP 140/76  Pulse 93  Temp(Src) 98.5 F (36.9 C) (Oral)  Resp 16  Ht 5' 3.5" (1.613 m)  Wt 157 lb 12 oz (71.555 kg)  BMI 27.50 kg/m2  SpO2 98%  General appearance: alert, cooperative and appears stated age Head: Normocephalic, without obvious abnormality, atraumatic Eyes: conjunctivae/corneas clear. PERRL, EOM's intact. Fundi benign. Ears: normal TM's and external ear canals both ears Nose: Nares normal. Septum midline. Mucosa normal. No drainage or sinus tenderness. Throat: lips, mucosa, and tongue normal; teeth and gums normal Neck: no adenopathy, no carotid bruit, no  JVD, supple, symmetrical, trachea midline and thyroid not enlarged, symmetric, no tenderness/mass/nodules Lungs: clear to auscultation bilaterally Breasts: normal appearance, no masses or tenderness Heart: regular rate and rhythm, S1, S2 normal, no murmur, click, rub or gallop Abdomen: soft, non-tender; bowel sounds normal; no masses,  no organomegaly Extremities: extremities normal, atraumatic, no cyanosis or edema Pulses: 2+ and symmetric Skin: Skin color, texture, turgor normal. No rashes or lesions Neurologic: Alert and oriented X 3, normal strength and tone. Normal symmetric reflexes. Normal coordination and gait.    Assessment and Plan:   Routine general medical examination at a health care facility Annual comprehensive exam was done including breast, excluding pelvic and PAP smear. All screenings have been addressed .   Abnormal kidney function Explained that fasting cr of 1.16 may be actually representative of mild dehydration.  Will repeat nonfasting.   Weight gain I have addressed  BMI and recommended wt loss of 10% of body weigh over the next 6 months using a low glycemic index diet and regular exercise a minimum of 5 days per week.    Unspecified hypothyroidism Thyroid function was not checked adequately on recent labs.  Only a t4 and T3,  Will not adjust without a TSH    Updated Medication List Outpatient Encounter Prescriptions as of 12/22/2013  Medication Sig  . buPROPion (WELLBUTRIN SR) 100 MG 12 hr tablet TAKE 1 TABLET (100 MG  TOTAL) BY MOUTH 2 (TWO) TIMES DAILY.  . busPIRone (BUSPAR) 15 MG tablet TAKE 1 TABLET (15 MG TOTAL) BY MOUTH 2 to 3 (THREE) TIMES DAILY FOR ANXIETY  . Calcium Carbonate-Vit D-Min (CALCIUM 1200 PO) Take 1 tablet by mouth daily.  . Cholecalciferol (VITAMIN D PO) Take by mouth. Pt takes 5000iu daily  . CINNAMON PO Take 2,000 mg by mouth daily.  . clobetasol ointment (TEMOVATE) 0.05 % Apply 1 application topically 2 (two) times daily. For one  week,  Then stop  . Cyanocobalamin (VITAMIN B 12 PO) Take by mouth.  . cyclobenzaprine (FLEXERIL) 10 MG tablet TAKE 1 TABLET (10 MG TOTAL) BY MOUTH AT BEDTIME AS NEEDED FOR MUSCLE SPASMS.  Tery Sanfilippo. Docusate Calcium (STOOL SOFTENER PO) Take by mouth.  . EPINEPHrine (EPIPEN) 0.3 mg/0.3 mL DEVI Inject 0.3 mLs (0.3 mg total) into the muscle once.  [name]. HYDROcodone-acetaminophen (NORCO/VICODIN) 5-325 MG per tablet Take 1 tablet by mouth at bedtime as needed and may repeat dose one time if needed. May refill on or after January 26 2014  . hydrOXYzine (ATARAX/VISTARIL) 25 MG tablet TAKE 1 TABLET (25 MG TOTAL) BY MOUTH 3 (THREE) TIMES DAILY AS NEEDED FOR ITCHING.  . Multiple Vitamin (MULTIVITAMIN) tablet Take 1 tablet by mouth daily.  . nitroGLYCERIN (NITROSTAT) 0.4 MG SL tablet Place 1 tablet (0.4 mg total) under the tongue every 5 (five) minutes as needed for chest pain.  . Omega-3 Fatty Acids (FISH OIL) 1000 MG CAPS Take 1 capsule by mouth daily.  . progesterone (PROMETRIUM) 200 MG capsule Take 200 mg by mouth daily.  . propranolol (INDERAL) 10 MG tablet Take 1 tablet (10 mg total) by mouth 3 (three) times daily as needed.  . thyroid (ARMOUR) 30 MG tablet Take 1 tablet (30 mg total) by mouth daily.  . vitamin C (ASCORBIC ACID) 500 MG tablet Take 500 mg by mouth daily.  . [DISCONTINUED] buPROPion (WELLBUTRIN SR) 100 MG 12 hr tablet Take 1 tablet by mouth two  times daily  . [DISCONTINUED] HYDROcodone-acetaminophen (NORCO/VICODIN) 5-325 MG per tablet Take 1 tablet by mouth at bedtime as needed and may repeat dose one time if needed. May refill on or after November 25 2013  . [DISCONTINUED] HYDROcodone-acetaminophen (NORCO/VICODIN) 5-325 MG per tablet Take 1 tablet by mouth at bedtime as needed and may repeat dose one time if needed. May refill on or after December 26 2013  . meloxicam (MOBIC) 15 MG tablet Take 1 tablet (15 mg total) by mouth daily.  . [DISCONTINUED] predniSONE (DELTASONE) 20 MG tablet 3 tabs by mouth  daily x 3 days, then 2 tabs by mouth daily x 2 days then 1 tab by mouth daily x 2 days

## 2013-12-22 NOTE — Patient Instructions (Addendum)
You had your annual  wellness exam today.  We will repeat your PAP smear in 2017, sooner if needed   Your kidney function was recently SLIGHLTY abnormal.  Your last cr th at I have prior to now was normal at 0.8 in March 2014 It may have been due to mild dehydration since you were fasting Please hydrate well tonight with water (nothing caffeinated) and have the repeat blood and urine tests done at [facility].  If it is still abnormal,  We will do additional testing to see why    I am recommending daily meloxicam for your wrist pain and other arthritis pain (assuming your kidney function is back to normal).   Take  It daily with breakfast.    Congratulations on staying off the cigarettes!!

## 2013-12-22 NOTE — Progress Notes (Signed)
Pre-visit discussion using our clinic review tool. No additional management support is needed unless otherwise documented below in the visit note.  

## 2013-12-23 ENCOUNTER — Ambulatory Visit: Payer: Self-pay | Admitting: Internal Medicine

## 2013-12-23 ENCOUNTER — Encounter: Payer: Self-pay | Admitting: Internal Medicine

## 2013-12-23 LAB — HM MAMMOGRAPHY: HM Mammogram: NEGATIVE

## 2013-12-23 NOTE — Assessment & Plan Note (Signed)
I have addressed  BMI and recommended wt loss of 10% of body weigh over the next 6 months using a low glycemic index diet and regular exercise a minimum of 5 days per week.   

## 2013-12-23 NOTE — Assessment & Plan Note (Addendum)
Thyroid function was not checked adequately on recent labs.  Only a t4 and T3,  Will not adjust without a TSH

## 2013-12-23 NOTE — Assessment & Plan Note (Signed)
Explained that fasting cr of 1.16 may be actually representative of mild dehydration.  Will repeat nonfasting.

## 2013-12-24 ENCOUNTER — Encounter: Payer: Self-pay | Admitting: *Deleted

## 2013-12-25 LAB — BASIC METABOLIC PANEL
BUN: 11 mg/dL (ref 4–21)
Creatinine: 1 mg/dL (ref 0.5–1.1)
Glucose: 92 mg/dL
Potassium: 4.1 mmol/L (ref 3.4–5.3)
Sodium: 139 mmol/L (ref 137–147)

## 2013-12-29 ENCOUNTER — Encounter: Payer: Self-pay | Admitting: Internal Medicine

## 2013-12-30 ENCOUNTER — Other Ambulatory Visit: Payer: Self-pay | Admitting: Internal Medicine

## 2013-12-30 ENCOUNTER — Telehealth: Payer: Self-pay | Admitting: Internal Medicine

## 2013-12-30 ENCOUNTER — Encounter: Payer: Self-pay | Admitting: *Deleted

## 2013-12-30 NOTE — Telephone Encounter (Signed)
Your urinalysis and kidney function are normal.  You do not need any medication changes. Please plan to repeat the labs in 6 months.    Regards,   Dr. Darrick Huntsmanullo

## 2013-12-30 NOTE — Telephone Encounter (Signed)
Letter mailed

## 2014-01-19 ENCOUNTER — Encounter: Payer: Self-pay | Admitting: Internal Medicine

## 2014-02-22 ENCOUNTER — Telehealth: Payer: Self-pay | Admitting: Internal Medicine

## 2014-02-22 DIAGNOSIS — M19211 Secondary osteoarthritis, right shoulder: Secondary | ICD-10-CM

## 2014-02-22 DIAGNOSIS — M19212 Secondary osteoarthritis, left shoulder: Principal | ICD-10-CM

## 2014-02-22 MED ORDER — HYDROCODONE-ACETAMINOPHEN 5-325 MG PO TABS: 1.0000 | ORAL_TABLET | Freq: Every evening | ORAL | Status: DC | PRN

## 2014-02-22 NOTE — Telephone Encounter (Signed)
Patient requesting refill on Norco last fill 01/26/14

## 2014-02-22 NOTE — Telephone Encounter (Signed)
Left message, notifying Rx ready for pickup 

## 2014-03-22 ENCOUNTER — Telehealth: Payer: Self-pay | Admitting: Internal Medicine

## 2014-03-22 DIAGNOSIS — M19212 Secondary osteoarthritis, left shoulder: Principal | ICD-10-CM

## 2014-03-22 DIAGNOSIS — M19211 Secondary osteoarthritis, right shoulder: Secondary | ICD-10-CM

## 2014-03-22 NOTE — Telephone Encounter (Signed)
Patient has scheduled follow up for December and is request refill on Hydrocodone until appointment.

## 2014-03-29 ENCOUNTER — Encounter: Payer: Self-pay | Admitting: Internal Medicine

## 2014-03-30 MED ORDER — HYDROCODONE-ACETAMINOPHEN 5-325 MG PO TABS: 1.0000 | ORAL_TABLET | Freq: Every evening | ORAL | Status: DC | PRN

## 2014-03-30 NOTE — Telephone Encounter (Signed)
Yes, she was seen in July  Nov and dec rx's printed

## 2014-03-30 NOTE — Telephone Encounter (Signed)
Patient called requesting refill on Vicodin  Please advise Ok to fill?

## 2014-03-30 NOTE — Telephone Encounter (Signed)
Left detailed message to patient scripts ready for pick up after 1 O'clock.

## 2014-03-30 NOTE — Telephone Encounter (Signed)
Placed scripts at front desk

## 2014-03-30 NOTE — Addendum Note (Signed)
Addended by: Sherlene ShamsULLO, Champ Keetch L on: 03/30/2014 10:42 AM   Modules accepted: Orders, Medications

## 2014-04-27 ENCOUNTER — Ambulatory Visit: Payer: 59 | Admitting: Internal Medicine

## 2014-05-03 ENCOUNTER — Ambulatory Visit (INDEPENDENT_AMBULATORY_CARE_PROVIDER_SITE_OTHER): Payer: 59 | Admitting: Internal Medicine

## 2014-05-03 ENCOUNTER — Encounter: Payer: Self-pay | Admitting: Internal Medicine

## 2014-05-03 VITALS — BP 140/72 | HR 89 | Temp 98.6°F | Resp 16 | Ht 63.0 in | Wt 159.5 lb

## 2014-05-03 DIAGNOSIS — M19212 Secondary osteoarthritis, left shoulder: Secondary | ICD-10-CM

## 2014-05-03 DIAGNOSIS — M5416 Radiculopathy, lumbar region: Secondary | ICD-10-CM

## 2014-05-03 DIAGNOSIS — L981 Factitial dermatitis: Secondary | ICD-10-CM

## 2014-05-03 DIAGNOSIS — N289 Disorder of kidney and ureter, unspecified: Secondary | ICD-10-CM

## 2014-05-03 DIAGNOSIS — J209 Acute bronchitis, unspecified: Secondary | ICD-10-CM

## 2014-05-03 DIAGNOSIS — M19211 Secondary osteoarthritis, right shoulder: Secondary | ICD-10-CM

## 2014-05-03 DIAGNOSIS — E039 Hypothyroidism, unspecified: Secondary | ICD-10-CM

## 2014-05-03 MED ORDER — LEVOFLOXACIN 500 MG PO TABS: 500.0000 mg | ORAL_TABLET | Freq: Every day | ORAL | Status: DC

## 2014-05-03 MED ORDER — HYDROCODONE-ACETAMINOPHEN 5-325 MG PO TABS: 1.0000 | ORAL_TABLET | Freq: Every evening | ORAL | Status: DC | PRN

## 2014-05-03 MED ORDER — CLOBETASOL PROPIONATE 0.05 % EX OINT: 1.0000 "application " | TOPICAL_OINTMENT | Freq: Two times a day (BID) | CUTANEOUS | Status: DC

## 2014-05-03 MED ORDER — BENZONATATE 200 MG PO CAPS: 200.0000 mg | ORAL_CAPSULE | Freq: Three times a day (TID) | ORAL | Status: DC | PRN

## 2014-05-03 MED ORDER — PREDNISONE (PAK) 10 MG PO TABS: ORAL_TABLET | ORAL | Status: DC

## 2014-05-03 NOTE — Progress Notes (Signed)
Patient ID: Cheryl [name], female   DOB: 1955/12/26, 58 y.o.   MRN: [id number]   Patient Active Problem List   Diagnosis Date Noted  . Abnormal kidney function 12/22/2013  . Acute right lumbar radiculopathy 09/18/2013  . Screening for osteoporosis 08/20/2013  . Weight gain 08/20/2013  . Rash 11/21/2012  . Routine general medical examination at a health care facility 10/26/2012  . Chest pain 08/22/2012  . Anxiety 08/22/2012  . Hypothyroidism 08/17/2012  . Bursitis of left shoulder 08/17/2012  . Irritable bowel syndrome 08/17/2012  . 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 History of tobacco abuse 08/17/2012  . Hearing loss d/t noise 08/17/2012  . Colon cancer screening 08/17/2012  . Witnessed apneic spells 08/17/2012  . Leukocytosis, unspecified 08/17/2012  . Hematuria, microscopic 08/16/2012  . 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 Follow-up    medication refills.  . Cough    non-productive for 3 weeks has taken robitussin but does not help.    HPI:   Cheryl [name] is a 58 y.o. female who presents for   Follow up on chronic back and shoulder pain treated with vicodin,  Hypothyroidism, and elevated Cr.  Ths tsh has apparently been repeated at [facility] but is not available today,  The cr was repeated and < 1.0     The back and shoulder pain are tolerable with use of vicodin,  She has had steroid injections in the left shoulder which have not helped,  She wants to avoid surgery,   She reports a  3 week history of cough which has been nonproductive.  The cough is so severe that she has  Rib pain and is being kept up at night .  She has had  wheezing  whivh has resolved,  And no fevers.  Has not smoked in over 6 months,  No recent travel,    Her bilateral leg rash has not resolved.  She was seen by an allergist with no resolution and is requesting refill on streroid cream.  The rsih is intensely pruritic and hurts,  and has spread to her lower abdomen,   Lab Results  Component Value Date   TSH 0.715 08/15/2012   Lab Results  Component Value Date   CREATININE 1.0 12/25/2013     Past Medical History  Diagnosis Date  . Arthritis   . Blood in stool   . Chicken pox   . Generalized headaches   . History of blood transfusion   . Thyroid disease   . UTI (lower urinary tract infection)   . Arrhythmia     left bundle branch block    Past Surgical History  Procedure Laterality Date  . Breast surgery  1976  . Appendectomy  1981  . Tonsillectomy and adenoidectomy  1964  . Cystic fibrosis tumor removal  1983  . 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 Cardiac catheterization  2004    [facility]  . Cardiac catheterization  2006    [facility]  . Abdominal hysterectomy  1997       The following portions of the patient's history were reviewed and updated as appropriate: Allergies, current medications, and problem list.    Review of Systems:   Patient denies headache, fevers, malaise, unintentional weight loss, skin rash, eye pain, sinus congestion and sinus pain, sore throat, dysphagia,  hemoptysis , cough, dyspnea, wheezing, chest pain, palpitations, orthopnea, edema, abdominal pain, nausea,  melena, diarrhea, constipation, flank pain, dysuria, hematuria, urinary  Frequency, nocturia, numbness, tingling, seizures,  Focal weakness, Loss of consciousness,  Tremor, insomnia, depression, anxiety, and suicidal ideation.     History   Social History  . Marital Status: Married    Spouse Name: N/A    Number of Children: N/A  . Years of Education: N/A   Occupational History  . Not on file.   Social History Main Topics  . Smoking status: Former Smoker -- 0.25 packs/day for 34 years    Types: Cigarettes    Quit date: 04/10/2013  . Smokeless tobacco: Never Used  . Alcohol Use: Yes     Comment: occasional  . Drug Use: No  . Sexual Activity: Not on file   Other Topics Concern  . Not on file   Social  History Narrative   Married to [employer]   Works for [employer], Engineer, structuraladministrative secretary    Objective:  Filed Vitals:   05/03/14 1546  BP: 140/72  Pulse: 89  Temp: 98.6 F (37 C)  Resp: 16     General appearance: alert, cooperative and appears stated age Ears: normal TM's and external ear canals both ears Throat: lips, mucosa, and tongue normal; teeth and gums normal Neck: no adenopathy, no carotid bruit, supple, symmetrical, trachea midline and thyroid not enlarged, symmetric, no tenderness/mass/nodules Back: symmetric, no curvature. ROM normal. No CVA tenderness. Lungs: clear to auscultation bilaterally Heart: regular rate and rhythm, S1, S2 normal, no murmur, click, rub or gallop Abdomen: soft, non-tender; bowel sounds normal; no masses,  no organomegaly Pulses: 2+ and symmetric Skin: Skin color, texture, turgor normal. No rashes or lesions Lymph nodes: Cervical, supraclavicular, and axillary nodes normal.  Assessment and Plan:  Hypothyroidism continue current dose pending review of TSH from [facility]  Excoriation, neurotic Rash appears to be due to neurotic itching,  Refill clobetasol and hydroxyzine  Abnormal kidney function Repeat Cr was < 1.0,  Prior elevation was Liley due to dehydration   Lab Results  Component Value Date   CREATININE 1.0 12/25/2013     Acute right lumbar radiculopathy Continue  vicodin for breakthrough pain.  Will need PT referral and MRI     Acute bronchitis I am prescribing you a prednisone taper, a 7 day course of levaquin  And tessalon perles   Updated Medication List Outpatient Encounter Prescriptions as of 05/03/2014  Medication Sig  . buPROPion (WELLBUTRIN SR) 100 MG 12 hr tablet Take 1 tablet by mouth two  times daily  . busPIRone (BUSPAR) 15 MG tablet TAKE 1 TABLET (15 MG TOTAL) BY MOUTH 2 to 3 (THREE) TIMES DAILY FOR ANXIETY  . Calcium Carbonate-Vit D-Min (CALCIUM 1200 PO) Take 1 tablet by mouth daily.  . Cholecalciferol  (VITAMIN D PO) Take by mouth. Pt takes 5000iu daily  . CINNAMON PO Take 2,000 mg by mouth daily.  . Cyanocobalamin (VITAMIN B 12 PO) Take by mouth.  Tery Sanfilippo. Docusate Calcium (STOOL SOFTENER PO) Take by mouth.  . EPINEPHrine (EPIPEN) 0.3 mg/0.3 mL DEVI Inject 0.3 mLs (0.3 mg total) into the muscle once.  [name]. HYDROcodone-acetaminophen (NORCO/VICODIN) 5-325 MG per tablet Take 1 tablet by mouth at bedtime as needed and may repeat dose one time if needed. May refill on or May 28 2014  . hydrOXYzine (ATARAX/VISTARIL) 25 MG tablet TAKE 1 TABLET (25 MG TOTAL) BY MOUTH 3 (THREE) TIMES DAILY AS NEEDED FOR ITCHING.  . meloxicam (MOBIC) 15 MG tablet Take 1 tablet (15 mg total) by mouth  daily.  . Multiple Vitamin (MULTIVITAMIN) tablet Take 1 tablet by mouth daily.  . nitroGLYCERIN (NITROSTAT) 0.4 MG SL tablet Place 1 tablet (0.4 mg total) under the tongue every 5 (five) minutes as needed for chest pain.  . Omega-3 Fatty Acids (FISH OIL) 1000 MG CAPS Take 1 capsule by mouth daily.  . progesterone (PROMETRIUM) 200 MG capsule Take 200 mg by mouth daily.  . propranolol (INDERAL) 10 MG tablet Take 1 tablet (10 mg total) by mouth 3 (three) times daily as needed.  . thyroid (ARMOUR) 30 MG tablet Take 1 tablet (30 mg total) by mouth daily.  . vitamin C (ASCORBIC ACID) 500 MG tablet Take 500 mg by mouth daily.  . [DISCONTINUED] HYDROcodone-acetaminophen (NORCO/VICODIN) 5-325 MG per tablet Take 1 tablet by mouth at bedtime as needed and may repeat dose one time if needed. May refill on or Apr 27 2014  . benzonatate (TESSALON) 200 MG capsule Take 1 capsule (200 mg total) by mouth 3 (three) times daily as needed for cough.  . clobetasol ointment (TEMOVATE) 0.05 % Apply 1 application topically 2 (two) times daily. For one week,  Then stop (Patient not taking: Reported on 05/03/2014)  . clobetasol ointment (TEMOVATE) 0.05 % Apply 1 application topically 2 (two) times daily. Until resolved  . cyclobenzaprine (FLEXERIL) 10 MG tablet  TAKE 1 TABLET (10 MG TOTAL) BY MOUTH AT BEDTIME AS NEEDED FOR MUSCLE SPASMS. (Patient not taking: Reported on 05/03/2014)  . levofloxacin (LEVAQUIN) 500 MG tablet Take 1 tablet (500 mg total) by mouth daily.  . predniSONE (STERAPRED UNI-PAK) 10 MG tablet 6 tablets on Day 1 , then reduce by 1 tablet daily until gone

## 2014-05-03 NOTE — Assessment & Plan Note (Signed)
Repeat Cr was < 1.0,  Prior elevation was Liley due to dehydration   Lab Results  Component Value Date   CREATININE 1.0 12/25/2013

## 2014-05-03 NOTE — Assessment & Plan Note (Addendum)
I am prescribing you a prednisone taper, a 7 day course of levaquin  And tessalon perles

## 2014-05-03 NOTE — Assessment & Plan Note (Signed)
Rash appears to be due to neurotic itching,  Refill clobetasol and hydroxyzine

## 2014-05-03 NOTE — Assessment & Plan Note (Addendum)
continue current dose pending review of [facility]SH from [facility]

## 2014-05-03 NOTE — Assessment & Plan Note (Signed)
Continue  vicodin for breakthrough pain.  Will need PT referral and MRI

## 2014-05-03 NOTE — Patient Instructions (Signed)
I am prescribing you a prednisone taper, a 7 day course of levaquin,  And tessalon perles for the cough.    You should use either sudafed PE/afrin for nasal congestion or Afrin nasal spray twice daily for 5 days for the ear and sinus congestion  I am prescribing clobetasol ointment for the ankle and abdomen rash.  Use the hydroxyzine to prevent you from scratching it. If it does not resolve I recommdnd referral to Dr [facility]

## 2014-05-03 NOTE — Progress Notes (Signed)
Pre-visit discussion using our clinic review tool. No additional management support is needed unless otherwise documented below in the visit note.  

## 2014-06-01 ENCOUNTER — Encounter: Payer: Self-pay | Admitting: Internal Medicine

## 2014-06-10 LAB — BASIC METABOLIC PANEL
BUN: 12 mg/dL (ref 4–21)
Creatinine: 0.9 mg/dL (ref 0.5–1.1)
Glucose: 101 mg/dL
Potassium: 4.6 mmol/L (ref 3.4–5.3)
Sodium: 140 mmol/L (ref 137–147)

## 2014-06-10 LAB — CBC AND DIFFERENTIAL
HCT: 30 % — AB (ref 36–46)
Hemoglobin: 9.8 g/dL — AB (ref 12.0–16.0)
Neutrophils Absolute: 4 /uL
Platelets: 327 10*3/uL (ref 150–399)
WBC: 8.1 10^3/mL

## 2014-06-10 LAB — HEPATIC FUNCTION PANEL
ALT: 20 U/L (ref 7–35)
AST: 23 U/L (ref 13–35)
Alkaline Phosphatase: 87 U/L (ref 25–125)
Bilirubin, Total: 0.7 mg/dL

## 2014-06-10 LAB — TSH: TSH: 0.05 u[IU]/mL — AB (ref 0.41–5.90)

## 2014-06-23 ENCOUNTER — Encounter: Payer: Self-pay | Admitting: *Deleted

## 2014-06-24 ENCOUNTER — Other Ambulatory Visit: Payer: Self-pay | Admitting: Internal Medicine

## 2014-06-24 ENCOUNTER — Telehealth: Payer: Self-pay | Admitting: Internal Medicine

## 2014-06-24 MED ORDER — THYROID 16.25 MG PO TABS: 16.2500 mg | ORAL_TABLET | Freq: Every day | ORAL | Status: DC

## 2014-06-24 NOTE — Telephone Encounter (Signed)
Left message, notifying of results.  

## 2014-06-24 NOTE — Telephone Encounter (Signed)
Thyroid function is overactive on current dose.  I have sent a lower dose of Armour thyroid  to her pharmacy and would like patient to change immediately.  Other labs are within normal range.

## 2014-06-25 ENCOUNTER — Other Ambulatory Visit: Payer: Self-pay | Admitting: *Deleted

## 2014-06-25 DIAGNOSIS — M19211 Secondary osteoarthritis, right shoulder: Secondary | ICD-10-CM

## 2014-06-25 DIAGNOSIS — M19212 Secondary osteoarthritis, left shoulder: Principal | ICD-10-CM

## 2014-06-25 MED ORDER — HYDROCODONE-ACETAMINOPHEN 5-325 MG PO TABS: 1.0000 | ORAL_TABLET | Freq: Every evening | ORAL | Status: DC | PRN

## 2014-06-25 NOTE — Telephone Encounter (Signed)
Pt notified Rx ready for pickup.  Pt also states she received message about changing her armour thyroid dose. She states she has actually been on levothyroxine from another provider due to the cost, has been on 100 mcg daily. Would like to continue levothyroxine, just needs new dose sent to [facility].

## 2014-06-25 NOTE — Telephone Encounter (Signed)
Ok to refill,  printed rx  For Feb 1 refill

## 2014-06-25 NOTE — Telephone Encounter (Signed)
Last visit 05/03/14, ok refill?

## 2014-07-27 ENCOUNTER — Ambulatory Visit (INDEPENDENT_AMBULATORY_CARE_PROVIDER_SITE_OTHER): Payer: 59 | Admitting: Internal Medicine

## 2014-07-27 ENCOUNTER — Encounter: Payer: Self-pay | Admitting: Internal Medicine

## 2014-07-27 VITALS — BP 128/70 | HR 90 | Temp 98.0°F | Resp 14 | Ht 63.5 in | Wt 157.8 lb

## 2014-07-27 DIAGNOSIS — M19211 Secondary osteoarthritis, right shoulder: Secondary | ICD-10-CM

## 2014-07-27 DIAGNOSIS — Z0000 Encounter for general adult medical examination without abnormal findings: Secondary | ICD-10-CM

## 2014-07-27 DIAGNOSIS — M19212 Secondary osteoarthritis, left shoulder: Secondary | ICD-10-CM

## 2014-07-27 DIAGNOSIS — Z23 Encounter for immunization: Secondary | ICD-10-CM

## 2014-07-27 DIAGNOSIS — E038 Other specified hypothyroidism: Secondary | ICD-10-CM

## 2014-07-27 DIAGNOSIS — M7552 Bursitis of left shoulder: Secondary | ICD-10-CM

## 2014-07-27 DIAGNOSIS — E034 Atrophy of thyroid (acquired): Secondary | ICD-10-CM

## 2014-07-27 DIAGNOSIS — G4733 Obstructive sleep apnea (adult) (pediatric): Secondary | ICD-10-CM

## 2014-07-27 DIAGNOSIS — D563 Thalassemia minor: Secondary | ICD-10-CM

## 2014-07-27 MED ORDER — HYDROCODONE-ACETAMINOPHEN 5-325 MG PO TABS: 1.0000 | ORAL_TABLET | Freq: Every evening | ORAL | Status: DC | PRN

## 2014-07-27 NOTE — Patient Instructions (Signed)
You received the Pneumovax vaccine today (because you have been a smoker, it is recommended)  Referral to Charlann Boxer for left shoulder pain is underway   Mammogram is due in July   Health Maintenance Adopting a healthy lifestyle and getting preventive care can go a long way to promote health and wellness. Talk with your health care provider about what schedule of regular examinations is right for you. This is a good chance for you to check in with your provider about disease prevention and staying healthy. In between checkups, there are plenty of things you can do on your own. Experts have done a lot of research about which lifestyle changes and preventive measures are most likely to keep you healthy. Ask your health care provider for more information. WEIGHT AND DIET  Eat a healthy diet  Be sure to include plenty of vegetables, fruits, low-fat dairy products, and lean protein.  Do not eat a lot of foods high in solid fats, added sugars, or salt.  Get regular exercise. This is one of the most important things you can do for your health.  Most adults should exercise for at least 150 minutes each week. The exercise should increase your heart rate and make you sweat (moderate-intensity exercise).  Most adults should also do strengthening exercises at least twice a week. This is in addition to the moderate-intensity exercise.  Maintain a healthy weight  Body mass index (BMI) is a measurement that can be used to identify possible weight problems. It estimates body fat based on height and weight. Your health care provider can help determine your BMI and help you achieve or maintain a healthy weight.  For females 35 years of age and older:   A BMI below 18.5 is considered underweight.  A BMI of 18.5 to 24.9 is normal.  A BMI of 25 to 29.9 is considered overweight.  A BMI of 30 and above is considered obese.  Watch levels of cholesterol and blood lipids  You should start having your  blood tested for lipids and cholesterol at 59 years of age, then have this test every 5 years.  You may need to have your cholesterol levels checked more often if:  Your lipid or cholesterol levels are high.  You are older than 59 years of age.  You are at high risk for heart disease.  CANCER SCREENING   Lung Cancer  Lung cancer screening is recommended for adults 13-51 years old who are at high risk for lung cancer because of a history of smoking.  A yearly low-dose CT scan of the lungs is recommended for people who:  Currently smoke.  Have quit within the past 15 years.  Have at least a 30-pack-year history of smoking. A pack year is smoking an average of one pack of cigarettes a day for 1 year.  Yearly screening should continue until it has been 15 years since you quit.  Yearly screening should stop if you develop a health problem that would prevent you from having lung cancer treatment.  Breast Cancer  Practice breast self-awareness. This means understanding how your breasts normally appear and feel.  It also means doing regular breast self-exams. Let your health care provider know about any changes, no matter how small.  If you are in your 20s or 30s, you should have a clinical breast exam (CBE) by a health care provider every 1-3 years as part of a regular health exam.  If you are 8 or older, have a  CBE every year. Also consider having a breast X-ray (mammogram) every year.  If you have a family history of breast cancer, talk to your health care provider about genetic screening.  If you are at high risk for breast cancer, talk to your health care provider about having an MRI and a mammogram every year.  Breast cancer gene (BRCA) assessment is recommended for women who have family members with BRCA-related cancers. BRCA-related cancers include:  Breast.  Ovarian.  Tubal.  Peritoneal cancers.  Results of the assessment will determine the need for genetic  counseling and BRCA1 and BRCA2 testing. Cervical Cancer Routine pelvic examinations to screen for cervical cancer are no longer recommended for nonpregnant women who are considered low risk for cancer of the pelvic organs (ovaries, uterus, and vagina) and who do not have symptoms. A pelvic examination may be necessary if you have symptoms including those associated with pelvic infections. Ask your health care provider if a screening pelvic exam is right for you.   The Pap test is the screening test for cervical cancer for women who are considered at risk.  If you had a hysterectomy for a problem that was not cancer or a condition that could lead to cancer, then you no longer need Pap tests.  If you are older than 65 years, and you have had normal Pap tests for the past 10 years, you no longer need to have Pap tests.  If you have had past treatment for cervical cancer or a condition that could lead to cancer, you need Pap tests and screening for cancer for at least 20 years after your treatment.  If you no longer get a Pap test, assess your risk factors if they change (such as having a new sexual partner). This can affect whether you should start being screened again.  Some women have medical problems that increase their chance of getting cervical cancer. If this is the case for you, your health care provider may recommend more frequent screening and Pap tests.  The human papillomavirus (HPV) test is another test that may be used for cervical cancer screening. The HPV test looks for the virus that can cause cell changes in the cervix. The cells collected during the Pap test can be tested for HPV.  The HPV test can be used to screen women 22 years of age and older. Getting tested for HPV can extend the interval between normal Pap tests from three to five years.  An HPV test also should be used to screen women of any age who have unclear Pap test results.  After 59 years of age, women should have  HPV testing as often as Pap tests.  Colorectal Cancer  This type of cancer can be detected and often prevented.  Routine colorectal cancer screening usually begins at 59 years of age and continues through 59 years of age.  Your health care provider may recommend screening at an earlier age if you have risk factors for colon cancer.  Your health care provider may also recommend using home test kits to check for hidden blood in the stool.  A small camera at the end of a tube can be used to examine your colon directly (sigmoidoscopy or colonoscopy). This is done to check for the earliest forms of colorectal cancer.  Routine screening usually begins at age 9.  Direct examination of the colon should be repeated every 5-10 years through 59 years of age. However, you may need to be screened more often  if early forms of precancerous polyps or small growths are found. Skin Cancer  Check your skin from head to toe regularly.  Tell your health care provider about any new moles or changes in moles, especially if there is a change in a mole's shape or color.  Also tell your health care provider if you have a mole that is larger than the size of a pencil eraser.  Always use sunscreen. Apply sunscreen liberally and repeatedly throughout the day.  Protect yourself by wearing long sleeves, pants, a wide-brimmed hat, and sunglasses whenever you are outside. HEART DISEASE, DIABETES, AND HIGH BLOOD PRESSURE   Have your blood pressure checked at least every 1-2 years. High blood pressure causes heart disease and increases the risk of stroke.  If you are between 28 years and 21 years old, ask your health care provider if you should take aspirin to prevent strokes.  Have regular diabetes screenings. This involves taking a blood sample to check your fasting blood sugar level.  If you are at a normal weight and have a low risk for diabetes, have this test once every three years after 59 years of  age.  If you are overweight and have a high risk for diabetes, consider being tested at a younger age or more often. PREVENTING INFECTION  Hepatitis B  If you have a higher risk for hepatitis B, you should be screened for this virus. You are considered at high risk for hepatitis B if:  You were born in a country where hepatitis B is common. Ask your health care provider which countries are considered high risk.  Your parents were born in a high-risk country, and you have not been immunized against hepatitis B (hepatitis B vaccine).  You have HIV or AIDS.  You use needles to inject street drugs.  You live with someone who has hepatitis B.  You have had sex with someone who has hepatitis B.  You get hemodialysis treatment.  You take certain medicines for conditions, including cancer, organ transplantation, and autoimmune conditions. Hepatitis C  Blood testing is recommended for:  Everyone born from [birth year] through 1965.  Anyone with known risk factors for hepatitis C. Sexually transmitted infections (STIs)  You should be screened for sexually transmitted infections (STIs) including gonorrhea and chlamydia if:  You are sexually active and are younger than 59 years of age.  You are older than 59 years of age and your health care provider tells you that you are at risk for this type of infection.  Your sexual activity has changed since you were last screened and you are at an increased risk for chlamydia or gonorrhea. Ask your health care provider if you are at risk.  If you do not have HIV, but are at risk, it may be recommended that you take a prescription medicine daily to prevent HIV infection. This is called pre-exposure prophylaxis (PrEP). You are considered at risk if:  You are sexually active and do not regularly use condoms or know the HIV status of your partner(s).  You take drugs by injection.  You are sexually active with a partner who has HIV. Talk with your health  care provider about whether you are at high risk of being infected with HIV. If you choose to begin PrEP, you should first be tested for HIV. You should then be tested every 3 months for as long as you are taking PrEP.  PREGNANCY   If you are premenopausal and you may become pregnant,  ask your health care provider about preconception counseling.  If you may become pregnant, take 400 to 800 micrograms (mcg) of folic acid every day.  If you want to prevent pregnancy, talk to your health care provider about birth control (contraception). OSTEOPOROSIS AND MENOPAUSE   Osteoporosis is a disease in which the bones lose minerals and strength with aging. This can result in serious bone fractures. Your risk for osteoporosis can be identified using a bone density scan.  If you are 73 years of age or older, or if you are at risk for osteoporosis and fractures, ask your health care provider if you should be screened.  Ask your health care provider whether you should take a calcium or vitamin D supplement to lower your risk for osteoporosis.  Menopause may have certain physical symptoms and risks.  Hormone replacement therapy may reduce some of these symptoms and risks. Talk to your health care provider about whether hormone replacement therapy is right for you.  HOME CARE INSTRUCTIONS   Schedule regular health, dental, and eye exams.  Stay current with your immunizations.   Do not use any tobacco products including cigarettes, chewing tobacco, or electronic cigarettes.  If you are pregnant, do not drink alcohol.  If you are breastfeeding, limit how much and how often you drink alcohol.  Limit alcohol intake to no more than 1 drink per day for nonpregnant women. One drink equals 12 ounces of beer, 5 ounces of wine, or 1 ounces of hard liquor.  Do not use street drugs.  Do not share needles.  Ask your health care provider for help if you need support or information about quitting  drugs.  Tell your health care provider if you often feel depressed.  Tell your health care provider if you have ever been abused or do not feel safe at home. Document Released: 11/27/2010 Document Revised: 09/28/2013 Document Reviewed: 04/15/2013 [facility] Patient Information 2015 [place], [place]. This information is not intended to replace advice given to you by your health care provider. Make sure you discuss any questions you have with your health care provider.

## 2014-07-27 NOTE — Assessment & Plan Note (Signed)
hgb is stable,  avoidig iron

## 2014-07-27 NOTE — Progress Notes (Signed)
Pre-visit discussion using our clinic review tool. No additional management support is needed unless otherwise documented below in the visit note.  

## 2014-07-27 NOTE — Progress Notes (Signed)
Patient ID: Cheryl [name], female   DOB: 29-Jul-1955, 59 y.o.   MRN: [id number]  Subjective:     Cheryl [name] is a 58 y.o. female and is here for a comprehensive physical exam. The patient reports problems - with shoulder pain.   Annual exam  Left shoulder pain chronic, but has been  acting up  Bone spur and tendonitis by MRI of shoulder ordered by [facility] clinic in  2014 during follow up on wrist fracture.  Steroid shot did not help .  Has not seen ortho for shoulder since then   Discussed referral to [name] ..    turning head to the right  Causes pain in the left trapezius muscle and down the upper arm    Upper arm is also painful with any lifting of the arm .  Ambidextrous, overuses left side since her right thumb fracture   Thyroid dose was reduced by (Dr Manson Passey) based on last TSH  Menopause:  She continue to receive intradermal hormone replacement by Dr Tilden Fossa at [facility] in [place]  Subdermal estrogen/orgesterone pellet and  Has been receiving pelletes for the last 6 year,  Levels followed by Dr Manson Passey   History of tobacco abuse:  She has been tobacco abstinent and is using the Vape gradually reducing dose    History   Social History  . Marital Status: Married    Spouse Name: N/A  . Number of Children: N/A  . Years of Education: N/A   Occupational History  . Not on file.   Social History Main Topics  . Smoking status: Former Smoker -- 0.25 packs/day for 34 years    Types: Cigarettes    Quit date: 04/10/2013  . Smokeless tobacco: Never Used  . Alcohol Use: Yes     Comment: occasional  . Drug Use: No  . Sexual Activity: Not on file   Other Topics Concern  . Not on file   Social History Narrative   Married to [employer]   Works for [employer] Date Due  . HIV Screening  07/01/1970  . INFLUENZA VACCINE  08/26/2015 (Originally 12/26/2013)  . MAMMOGRAM  12/24/2014  . PAP SMEAR  11/19/2015   . TETANUS/TDAP  03/18/2021  . COLONOSCOPY  11/20/2022    The following portions of the patient's history were reviewed and updated as appropriate: allergies, current medications, past family history, past medical history, past social history, past surgical history and problem list.  Review of Systems  Patient denies headache, fevers, malaise, unintentional weight loss, skin rash, eye pain, sinus congestion and sinus pain, sore throat, dysphagia,  hemoptysis , cough, dyspnea, wheezing, chest pain, palpitations, orthopnea, edema, abdominal pain, nausea, melena, diarrhea, constipation, flank pain, dysuria, hematuria, urinary  Frequency, nocturia, numbness, tingling, seizures,  Focal weakness, Loss of consciousness,  Tremor, insomnia, depression, anxiety, and suicidal ideation.     Objective:   BP 128/70 mmHg  Pulse 90  Temp(Src) 98 F (36.7 C) (Oral)  Resp 14  Ht 5' 3.5" (1.613 m)  Wt 157 lb 12 oz (71.555 kg)  BMI 27.50 kg/m2  SpO2 98%  General appearance: alert, cooperative and appears stated age Throat: lips, mucosa, and tongue normal; teeth and gums normal Neck: no adenopathy, no carotid bruit, no JVD, supple, symmetrical, trachea midline and thyroid not enlarged, symmetric, no tenderness/mass/nodules Lungs: clear to auscultation bilaterally Breasts: normal appearance, no masses or tenderness Heart: regular rate and rhythm, S1, S2 normal,  no murmur, click, rub or gallop Abdomen: soft, non-tender; bowel sounds normal; no masses,  no organomegaly Extremities: extremities normal, atraumatic, no cyanosis or edema Pulses: 2+ and symmetric Skin: Skin color, texture, turgor normal. No rashes or lesions MSK: pain with aduction of left shoulder  Neurologic: Alert and oriented X 3, normal strength and tone. Normal symmetric reflexes. Normal coordination and gait.    Assessment and Plan:   Problem List Items Addressed This Visit    Thalassemia minor    hgb is stable,  avoidig iron        Sleep apnea, obstructive    Moderate to severe, by July 2014 sleep study.  CPAP titration was ordered but apparenttly  not done .  This will be ordered now.       Relevant Orders   Cpap titration   Routine general medical examination at a health care facility    Annual wellness  exam was done as well as a comprehensive physical exam and management of acute and chronic conditions .  During the course of the visit the patient was educated and counseled about appropriate screening and preventive services including :  diabetes screening, lipid analysis with projected  10 year  risk for CAD , nutrition counseling, colorectal cancer screening, and recommended immunizations.  Printed recommendations for health maintenance screenings was given.       Hypothyroidism    Thyroid function was overactive las month and she has been taking a lower dose since mid January .    No current changes needed.   Lab Results  Component Value Date   TSH 0.05* 06/10/2014         Bursitis of left shoulder    Referral to Terrilee FilesZach Smith in process for persistent pain       Relevant Orders   Ambulatory referral to Sports Medicine    Other Visit Diagnoses    Other secondary osteoarthritis of both shoulders    -  Primary    Relevant Medications    HYDROcodone-acetaminophen (NORCO/VICODIN) 5-325 MG per tablet    Need for prophylactic vaccination against Streptococcus pneumoniae (pneumococcus)        Relevant Orders    Pneumococcal polysaccharide vaccine 23-valent greater than or equal to 2yo subcutaneous/IM (Completed)

## 2014-07-28 ENCOUNTER — Encounter: Payer: Self-pay | Admitting: Internal Medicine

## 2014-07-28 NOTE — Assessment & Plan Note (Signed)

## 2014-07-28 NOTE — Assessment & Plan Note (Signed)
Referral to Terrilee FilesZach [name] in process for persistent pain

## 2014-07-28 NOTE — Assessment & Plan Note (Signed)
Moderate to severe, by July 2014 sleep study.  CPAP titration was ordered but apparenttly  not done .  This will be ordered now.

## 2014-07-28 NOTE — Assessment & Plan Note (Signed)
Thyroid function was overactive las month and she has been taking a lower dose since mid January .    No current changes needed.   Lab Results  Component Value Date   TSH 0.05* 06/10/2014

## 2014-08-11 ENCOUNTER — Encounter: Payer: Self-pay | Admitting: Nurse Practitioner

## 2014-08-11 ENCOUNTER — Ambulatory Visit (INDEPENDENT_AMBULATORY_CARE_PROVIDER_SITE_OTHER): Payer: 59 | Admitting: Nurse Practitioner

## 2014-08-11 VITALS — BP 128/72 | HR 84 | Temp 98.1°F | Resp 12 | Ht 63.5 in | Wt 157.8 lb

## 2014-08-11 DIAGNOSIS — R599 Enlarged lymph nodes, unspecified: Secondary | ICD-10-CM

## 2014-08-11 DIAGNOSIS — R590 Localized enlarged lymph nodes: Secondary | ICD-10-CM

## 2014-08-11 DIAGNOSIS — R112 Nausea with vomiting, unspecified: Secondary | ICD-10-CM | POA: Insufficient documentation

## 2014-08-11 MED ORDER — ONDANSETRON 4 MG PO TBDP: 4.0000 mg | ORAL_TABLET | Freq: Three times a day (TID) | ORAL | Status: DC | PRN

## 2014-08-11 NOTE — Patient Instructions (Addendum)
Follow up in 1 week.    BRAT diet- Bread, rice, applesauce, toast   Pepto

## 2014-08-11 NOTE — Progress Notes (Signed)
Pre visit review using our clinic review tool, if applicable. No additional management support is needed unless otherwise documented below in the visit note. 

## 2014-08-11 NOTE — Progress Notes (Signed)
   Subjective:    Patient ID: Cheryl LaineKathryn Ann [name], female    DOB: 06/09/1955, 59 y.o.   MRN: [id number]  HPI  Cheryl [name] is a 59 yo female with a CC of lump under left arm and groin.   1) Tender and radiating pain throughout limb and groin onset 4 days. N/V today. -Ate lunch and threw up  Sat evening out of shower and felt a knot in her left groin and yesterday noticed left axilla recently also.  Pain, fatigue, sharp pain in groin 10/10    Review of Systems  Constitutional: Negative for fever, chills, diaphoresis and fatigue.  Respiratory: Negative for chest tightness, shortness of breath and wheezing.   Cardiovascular: Negative for chest pain, palpitations and leg swelling.  Gastrointestinal: Positive for nausea and vomiting. Negative for diarrhea and rectal pain.  Skin: Negative for rash.       Lump in left groin area and left axilla  Neurological: Negative for dizziness, weakness, numbness and headaches.  Psychiatric/Behavioral: The patient is not nervous/anxious.       Objective:   Physical Exam  Constitutional: She is oriented to person, place, and time. She appears well-developed and well-nourished. No distress.  BP 128/72 mmHg  Pulse 84  Temp(Src) 98.1 F (36.7 C) (Oral)  Resp 12  Ht 5' 3.5" (1.613 m)  Wt 157 lb 12.8 oz (71.578 kg)  BMI 27.51 kg/m2  SpO2 95%   HENT:  Head: Normocephalic and atraumatic.  Right Ear: External ear normal.  Left Ear: External ear normal.  Cardiovascular: Normal rate, regular rhythm, normal heart sounds and intact distal pulses.  Exam reveals no gallop and no friction rub.   No murmur heard. Pulmonary/Chest: Effort normal and breath sounds normal. No respiratory distress. She has no wheezes. She has no rales. She exhibits no tenderness.  Lymphadenopathy:    She has no axillary adenopathy.       Left axillary: No pectoral and no lateral adenopathy present.      Left: Inguinal adenopathy present.  No adenopathy in the axilla noted,  tender and swollen lymph nodes in the inguinal area.   Neurological: She is alert and oriented to person, place, and time. No cranial nerve deficit. She exhibits normal muscle tone. Coordination normal.  Skin: Skin is warm and dry. No rash noted. She is not diaphoretic.  Psychiatric: She has a normal mood and affect. Her behavior is normal. Judgment and thought content normal.     Assessment & Plan:

## 2014-08-12 ENCOUNTER — Encounter: Payer: Self-pay | Admitting: Internal Medicine

## 2014-08-15 DIAGNOSIS — R590 Localized enlarged lymph nodes: Secondary | ICD-10-CM | POA: Insufficient documentation

## 2014-08-15 NOTE — Assessment & Plan Note (Addendum)
N/V starting today. BRAT diet and pepto-bismol. Sent in Zofran disintegrating tablets. FU in 1 week.

## 2014-08-15 NOTE — Assessment & Plan Note (Addendum)
Probably viral in nature. Painful and swollen. Started with nausea and vomiting today. Will follow up in 1 week. Told pt it may be 2-3 weeks before area resolves. Reassured painful nodes not as concerning. Pt has pain medications on hand.

## 2014-08-17 ENCOUNTER — Ambulatory Visit (INDEPENDENT_AMBULATORY_CARE_PROVIDER_SITE_OTHER): Payer: 59 | Admitting: Nurse Practitioner

## 2014-08-17 ENCOUNTER — Encounter: Payer: Self-pay | Admitting: Nurse Practitioner

## 2014-08-17 ENCOUNTER — Ambulatory Visit (INDEPENDENT_AMBULATORY_CARE_PROVIDER_SITE_OTHER): Payer: 59 | Admitting: Family Medicine

## 2014-08-17 ENCOUNTER — Encounter: Payer: Self-pay | Admitting: Family Medicine

## 2014-08-17 ENCOUNTER — Ambulatory Visit (INDEPENDENT_AMBULATORY_CARE_PROVIDER_SITE_OTHER): Payer: 59

## 2014-08-17 VITALS — BP 126/64 | HR 88 | Temp 98.0°F | Resp 12 | Ht 63.5 in | Wt 157.2 lb

## 2014-08-17 VITALS — BP 136/66 | HR 91 | Ht 63.5 in | Wt 158.0 lb

## 2014-08-17 DIAGNOSIS — M7552 Bursitis of left shoulder: Secondary | ICD-10-CM

## 2014-08-17 DIAGNOSIS — R590 Localized enlarged lymph nodes: Secondary | ICD-10-CM

## 2014-08-17 DIAGNOSIS — R599 Enlarged lymph nodes, unspecified: Secondary | ICD-10-CM | POA: Diagnosis not present

## 2014-08-17 DIAGNOSIS — M25512 Pain in left shoulder: Secondary | ICD-10-CM

## 2014-08-17 MED ORDER — DOXYCYCLINE HYCLATE 100 MG PO TABS: 100.0000 mg | ORAL_TABLET | Freq: Two times a day (BID) | ORAL | Status: DC

## 2014-08-17 NOTE — Progress Notes (Signed)
Pre visit review using our clinic review tool, if applicable. No additional management support is needed unless otherwise documented below in the visit note. 

## 2014-08-17 NOTE — Assessment & Plan Note (Signed)
Patient was given an injection and tolerated the procedure very well. We discussed icing regimen and home exercises. We discussed topical anti-inflammatories and patient was given a trial box. Patient was nearly pain-free when she left the office. If patient has any worsening pain we may need to consider workup of cervical pathology but I think this will be highly unlikely. Patient come back again in 3 weeks for further evaluation and treatment.

## 2014-08-17 NOTE — Progress Notes (Signed)
   Subjective:    Patient ID: Cheryl [name], female    DOB: 10/05/1955, 59 y.o.   MRN: [id number]  HPI  Cheryl [name] is a 59 yo female here for a follow up her of inguinal and axillary adenopathy x 10 days.   1) Pt reports she can not take much more because they are painful waiting has not helped. She reports the left inguinal nodes are same size and painful and the axillary left side nodes are larger.    Review of Systems  Constitutional: Negative for fever, chills, diaphoresis and fatigue.  Gastrointestinal: Negative for nausea and vomiting.       Nausea and vomiting resolved since last week  Hematological: Positive for adenopathy.       Objective:   Physical Exam  Constitutional: She is oriented to person, place, and time. She appears well-developed and well-nourished. No distress.  BP 126/64 mmHg  Pulse 88  Temp(Src) 98 F (36.7 C) (Oral)  Resp 12  Ht 5' 3.5" (1.613 m)  Wt 157 lb 4 oz (71.328 kg)  BMI 27.42 kg/m2  SpO2 95%   HENT:  Head: Normocephalic and atraumatic.  Right Ear: External ear normal.  Left Ear: External ear normal.  Neck: Normal range of motion. Neck supple.  Lymphadenopathy:    She has no cervical adenopathy.  Neurological: She is alert and oriented to person, place, and time.  Skin: Skin is warm and dry. No rash noted. She is not diaphoretic.  Did not feel axillary lymph nodes, but she reports there is a tender area, did not re-check inguinal.  Psychiatric: She has a normal mood and affect. Her behavior is normal. Judgment and thought content normal.        Assessment & Plan:

## 2014-08-17 NOTE — Progress Notes (Signed)
Tawana ScaleZach Wynonia Medero D.O. [facility] [address] [place], [place] [address] Phone: [phone] Subjective:    I'm seeing this patient by the request  of:  TULLO, Mar DaringERESA L, MD   CC: Left shoulder pain  BJY:[id number]:Subjective [name] is a 59 y.o. female coming in with complaint of left shoulder pain. Patient has had this pain for multiple months if not years. Patient did have a fall on her left wrist and did have a fracture. Patient also had an MRI of her shoulder 2 years ago that showed some tendinopathy but that was fairly unremarkable. Patient states unfortunately her that in the long day of significant typing she can have discomfort into the left shoulder. Describes it as a dull throbbing aching pain with some mild radiation to her neck. Patient denies any radiation down the arm or any numbness or weakness. States though that now it is affecting her sleep and waking her up at night. Patient puts the severity of 7 out of 10. Not responding over-the-counter medications well.     Past medical history, social, surgical and family history all reviewed in electronic medical record.   Review of Systems: No headache, visual changes, nausea, vomiting, diarrhea, constipation, dizziness, abdominal pain, skin rash, fevers, chills, night sweats, weight loss, swollen lymph nodes, body aches, joint swelling, muscle aches, chest pain, shortness of breath, mood changes.   Objective Blood pressure 136/66, pulse 91, height 5' 3.5" (1.613 m), weight 158 lb (71.668 kg), SpO2 97 %.  General: No apparent distress alert and oriented x3 mood and affect normal, dressed appropriately.  HEENT: Pupils equal, extraocular movements intact  Respiratory: Patient's speak in full sentences and does not appear short of breath  Cardiovascular: No lower extremity edema, non tender, no erythema  Skin: Warm dry intact with no signs of infection or rash on extremities or on axial skeleton.  Abdomen: Soft nontender    Neuro: Cranial nerves II through XII are intact, neurovascularly intact in all extremities with 2+ DTRs and 2+ pulses.  Lymph: No lymphadenopathy of posterior or anterior cervical chain or axillae bilaterally.  Gait normal with good balance and coordination.  MSK:  Non tender with full range of motion and good stability and symmetric strength and tone of elbows, wrist, hip, knee and ankles bilaterally.  Shoulder: left Inspection reveals no abnormalities, atrophy or asymmetry. Palpation is normal with no tenderness over AC joint or bicipital groove. ROM is full in all planes passively. Rotator cuff strength normal throughout. signs of impingement with positive Neer and Hawkin's tests, but negative empty can sign. Speeds and Yergason's tests normal. No labral pathology noted with negative Obrien's, negative clunk and good stability. Normal scapular function observed. No painful arc and no drop arm sign. No apprehension sign  MSK US performed of: left This study was ordered, performed, and interpreted by Terrilee FilesZach Sagrario Lineberry D.O.  Shoulder:   Supraspinatus:  Appears normal on long and transverse views, Bursal bulge seen with shoulder abduction on impingement view. Infraspinatus:  Appears normal on long and transverse views. Significant increase in Doppler flow Subscapularis:  Appears normal on long and transverse views. Positive bursa Teres Minor:  Appears normal on long and transverse views. AC joint:  Capsule undistended, no geyser sign. Glenohumeral Joint:  Appears normal without effusion. Glenoid Labrum:  Intact without visualized tears. Biceps Tendon:  Appears normal on long and transverse views, no fraying of tendon, tendon located in intertubercular groove, no subluxation with shoulder internal or external rotation.  Impression:  Subacromial bursitis  Procedure: Real-time Ultrasound Guided Injection of left glenohumeral joint Device: GE Logiq E  Ultrasound guided injection is preferred  based studies that show increased duration, increased effect, greater accuracy, decreased procedural pain, increased response rate with ultrasound guided versus blind injection.  Verbal informed consent obtained.  Time-out conducted.  Noted no overlying erythema, induration, or other signs of local infection.  Skin prepped in a sterile fashion.  Local anesthesia: Topical Ethyl chloride.  With sterile technique and under real time ultrasound guidance:  Joint visualized.  23g 1  inch needle inserted posterior approach. Pictures taken for needle placement. Patient did have injection of 2 cc of 1% lidocaine, 2 cc of 0.5% Marcaine, and 1.0 cc of Kenalog 40 mg/dL. Completed without difficulty  Pain immediately resolved suggesting accurate placement of the medication.  Advised to call if fevers/chills, erythema, induration, drainage, or persistent bleeding.  Images permanently stored and available for review in the ultrasound unit.  Impression: Technically successful ultrasound guided injection.   Procedure note 97110; 15 minutes spent for Therapeutic exercises as stated in above notes.  This included exercises focusing on stretching, strengthening, with significant focus on eccentric aspects.  We discussed shoulder abduction exercises with Thera-Band as well as external and internal rotation. We discussed different range of motion exercises with a broomstick. 3 sets of told to 15 repetitions were recommended Proper technique shown and discussed handout in great detail with ATC.  All questions were discussed and answered.      Impression and Recommendations:     This case required medical decision making of moderate complexity.

## 2014-08-17 NOTE — Patient Instructions (Signed)
Please see Cheryl [name] before leaving today.  Take the antibiotics as prescribed (2 x a day for 7 days).

## 2014-08-17 NOTE — Patient Instructions (Addendum)
Good to see you.   Ice 20 minutes 2 times daily. Usually after activity and before bed. Exercises 3 times a week.  Pennsaid twice daily Monitor at eye level Tennis ball duct tape between shoulder blades on chair See me again in 3 weeks.

## 2014-08-18 ENCOUNTER — Ambulatory Visit: Payer: 59 | Admitting: Nurse Practitioner

## 2014-08-20 NOTE — Assessment & Plan Note (Signed)
Pt is upset and reports even they hydrocodone does not do much for the pain. We will try doxycyline 100 mg twice daily for 7 days. Will refer to Dr. Lemar LivingsByrnett for possible biopsy. Pt is concerned about family hx of lymphoma.

## 2014-08-23 ENCOUNTER — Ambulatory Visit: Payer: Self-pay | Admitting: General Surgery

## 2014-09-01 ENCOUNTER — Ambulatory Visit
Admit: 2014-09-01 | Disposition: A | Discharge status: Home / Self Care | Payer: Self-pay | Attending: Internal Medicine | Admitting: Internal Medicine

## 2014-09-01 LAB — CALCIUM: Calcium, Total: 9.7 mg/dL

## 2014-09-01 LAB — CBC CANCER CENTER
Bands: 2 %
Basophil #: 0.1 x10 3/mm (ref 0.0–0.1)
Basophil %: 0.5 %
Basophil: 1 %
Comment - H1-Com4: NORMAL
Eosinophil #: 0.3 x10 3/mm (ref 0.0–0.7)
Eosinophil %: 2.4 %
Eosinophil: 2 %
HCT: 29.8 % — ABNORMAL LOW (ref 35.0–47.0)
HGB: 9.5 g/dL — ABNORMAL LOW (ref 12.0–16.0)
Lymphocyte #: 3.2 x10 3/mm (ref 1.0–3.6)
Lymphocyte %: 29.4 %
Lymphocytes: 24 %
MCH: 21.5 pg — ABNORMAL LOW (ref 26.0–34.0)
MCHC: 31.7 g/dL — ABNORMAL LOW (ref 32.0–36.0)
MCV: 68 fL — ABNORMAL LOW (ref 80–100)
Monocyte #: 0.8 x10 3/mm (ref 0.2–0.9)
Monocyte %: 7.3 %
Monocytes: 5 %
NRBC/100 WBC: 1 /100
Neutrophil #: 6.5 x10 3/mm (ref 1.4–6.5)
Neutrophil %: 60.4 %
Platelet: 301 x10 3/mm (ref 150–440)
RBC: 4.39 10*6/uL (ref 3.80–5.20)
RDW: 18.2 % — ABNORMAL HIGH (ref 11.5–14.5)
Segmented Neutrophils: 64 %
WBC: 10.8 x10 3/mm (ref 3.6–11.0)

## 2014-09-01 LAB — FOLATE: Folic Acid: 78 ng/mL

## 2014-09-01 LAB — IRON AND TIBC
Iron Bind.Cap.(Total): 318 (ref 250–450)
Iron Saturation: 24.2
Iron: 77 ug/dL
Unbound Iron-Bind.Cap.: 241.4

## 2014-09-01 LAB — CREATININE, SERUM
Creatinine: 0.95 mg/dL
EGFR (African American): >60
EGFR (Non-African Amer.): >60

## 2014-09-01 LAB — HEPATIC FUNCTION PANEL A (ARMC)
Albumin: 4.3 g/dL
Alkaline Phosphatase: 77 U/L
Bilirubin, Direct: 0.1 mg/dL
Bilirubin,Total: 0.6 mg/dL
Indirect Bilirubin: 0.5
SGOT(AST): 21 U/L
SGPT (ALT): 23 U/L
Total Protein: 7 g/dL

## 2014-09-01 LAB — LACTATE DEHYDROGENASE: LDH: 142 U/L

## 2014-09-01 LAB — FERRITIN: Ferritin (ARMC): 124 ng/mL

## 2014-09-01 LAB — APTT: Activated PTT: 34.9 secs (ref 23.6–35.9)

## 2014-09-01 LAB — PROTIME-INR
INR: 1
Prothrombin Time: 13.5 secs

## 2014-09-07 ENCOUNTER — Encounter: Payer: Self-pay | Admitting: Nurse Practitioner

## 2014-09-07 ENCOUNTER — Ambulatory Visit: Payer: 59 | Admitting: Family Medicine

## 2014-09-09 ENCOUNTER — Encounter: Payer: Self-pay | Admitting: Internal Medicine

## 2014-09-16 ENCOUNTER — Ambulatory Visit: Payer: 59 | Admitting: Family Medicine

## 2014-09-22 ENCOUNTER — Other Ambulatory Visit: Payer: Self-pay | Admitting: Internal Medicine

## 2014-09-22 DIAGNOSIS — M19212 Secondary osteoarthritis, left shoulder: Principal | ICD-10-CM

## 2014-09-22 DIAGNOSIS — M19211 Secondary osteoarthritis, right shoulder: Secondary | ICD-10-CM

## 2014-09-22 MED ORDER — HYDROCODONE-ACETAMINOPHEN 5-325 MG PO TABS: 1.0000 | ORAL_TABLET | Freq: Every evening | ORAL | Status: DC | PRN

## 2014-09-22 NOTE — Telephone Encounter (Signed)
Ok to refill,  Refill sent  

## 2014-09-22 NOTE — Telephone Encounter (Signed)
refill placed at front desk patient DPR aware can pick up at appointment at front desk.

## 2014-09-22 NOTE — Telephone Encounter (Signed)
Patient called for refill on vicodin last fill 08/27/2014 last OV 07/27/14. Pended and dated script

## 2014-09-24 ENCOUNTER — Telehealth: Payer: Self-pay | Admitting: *Deleted

## 2014-09-27 NOTE — Telephone Encounter (Signed)
Started in error

## 2014-10-11 ENCOUNTER — Telehealth: Payer: Self-pay | Admitting: *Deleted

## 2014-10-11 NOTE — Telephone Encounter (Signed)
Left message on patient voicemail.

## 2014-10-11 NOTE — Telephone Encounter (Signed)
Requesting Dr Sherrlyn HockPandit refer her to Dr Talmage CoinJeffrey Kerr - Endocrinologist pleases send MR and make appt to [phone]

## 2014-10-11 NOTE — Telephone Encounter (Signed)
Not sure what the referral is for. She needs to request her PMD Dr.Tullo for that. Thanks.

## 2014-10-20 ENCOUNTER — Ambulatory Visit (INDEPENDENT_AMBULATORY_CARE_PROVIDER_SITE_OTHER): Payer: 59 | Admitting: Internal Medicine

## 2014-10-20 ENCOUNTER — Encounter: Payer: Self-pay | Admitting: Internal Medicine

## 2014-10-20 VITALS — BP 128/70 | HR 87 | Temp 98.4°F | Resp 16 | Ht 64.0 in | Wt 156.2 lb

## 2014-10-20 DIAGNOSIS — K862 Cyst of pancreas: Secondary | ICD-10-CM

## 2014-10-20 DIAGNOSIS — R918 Other nonspecific abnormal finding of lung field: Secondary | ICD-10-CM

## 2014-10-20 DIAGNOSIS — R599 Enlarged lymph nodes, unspecified: Secondary | ICD-10-CM

## 2014-10-20 DIAGNOSIS — M545 Low back pain: Secondary | ICD-10-CM

## 2014-10-20 DIAGNOSIS — E785 Hyperlipidemia, unspecified: Secondary | ICD-10-CM

## 2014-10-20 DIAGNOSIS — M19212 Secondary osteoarthritis, left shoulder: Secondary | ICD-10-CM | POA: Diagnosis not present

## 2014-10-20 DIAGNOSIS — M7550 Bursitis of unspecified shoulder: Secondary | ICD-10-CM

## 2014-10-20 DIAGNOSIS — L720 Epidermal cyst: Secondary | ICD-10-CM

## 2014-10-20 DIAGNOSIS — R590 Localized enlarged lymph nodes: Secondary | ICD-10-CM

## 2014-10-20 DIAGNOSIS — M19211 Secondary osteoarthritis, right shoulder: Secondary | ICD-10-CM

## 2014-10-20 DIAGNOSIS — G8929 Other chronic pain: Secondary | ICD-10-CM

## 2014-10-20 MED ORDER — HYDROCODONE-ACETAMINOPHEN 5-325 MG PO TABS: 1.0000 | ORAL_TABLET | Freq: Every evening | ORAL | Status: DC | PRN

## 2014-10-20 MED ORDER — DICLOFENAC SODIUM 1.5 % TD SOLN: TRANSDERMAL | Status: DC

## 2014-10-20 NOTE — Progress Notes (Signed)
Subjective:  Patient ID: Cheryl [name], female    DOB: 08-18-1955  Age: 59 y.o. MRN: [id number]  CC: The primary encounter diagnosis was Other secondary osteoarthritis of both shoulders. Diagnoses of Hyperlipidemia, Chronic lower back pain, Subacromial bursitis, unspecified laterality, Inguinal adenopathy, Pancreatic cyst, Pulmonary nodules/lesions, multiple, and Epidermal inclusion cyst were also pertinent to this visit.  HPI Cheryl [name] presents for follow up on chronic issues including chronic low back  Pain, chronic kidney disease , bursitis of left shoulder,  and recent development of left axillary pain and left inguinal pain  attributed to painful LAD on 3/16 evaluation  by NP [name].  She was treated initialyl with doxycycline for presumed reactive LAD.  after being seen by [name] again for follow up, she  stated that the pain was unbearable and was referred to [facility] for LN biopsy.  However  Her insurance required evaluation by Heme/Onc prior to biopsy so she was referred to Dr Sherrlyn Hock who ordered a chest x ray, ultrasound  and CT scan which were done ahead of time and all were normal, so Dr Sherrlyn Hock did not actually see her.   Left shoulder pain :  Was given a left shoulder injection by [name] on 3/22 for subacromial bursitis diagnosed with ultrasound. She agrees that she had immediate resolution of shoulder pain which lasted  several weeks.  She states that the shoulder still flares a little at times but is generally better . She has not scheduled a follow up with hin but sates that she is working the shoulder daily using the exercises given to her br Dr. Katrinka Blazing. She has been continuing use of vicodin prn ,  Averaging 2 daily with documented intolerance to prior NSAID  trial.   The CT of chest abdomen and pelvis had several abnormalities that were not discussed in detail with patient by Dr Pandit's nurse, and she is very concerned and requesting an explanation today,  Which  was done. .     Outpatient Prescriptions Prior to Visit  Medication Sig Dispense Refill  . buPROPion (WELLBUTRIN SR) 100 MG 12 hr tablet Take 1 tablet by mouth two  times daily 180 tablet 0  . Calcium Carbonate-Vit D-Min (CALCIUM 1200 PO) Take 1 tablet by mouth daily.    . Cholecalciferol (VITAMIN D PO) Take by mouth. Pt takes 5000iu daily    . CINNAMON PO Take 2,000 mg by mouth daily.    . clobetasol ointment (TEMOVATE) 0.05 % Apply 1 application topically 2 (two) times daily. Until resolved 30 g 2  . Cyanocobalamin (VITAMIN B 12 PO) Take by mouth.    . cyclobenzaprine (FLEXERIL) 10 MG tablet TAKE 1 TABLET (10 MG TOTAL) BY MOUTH AT BEDTIME AS NEEDED FOR MUSCLE SPASMS. 30 tablet 1  . Docusate Calcium (STOOL SOFTENER PO) Take by mouth.    . doxycycline (VIBRA-TABS) 100 MG tablet Take 1 tablet (100 mg total) by mouth 2 (two) times daily. 14 tablet 0  . EPINEPHrine (EPIPEN) 0.3 mg/0.3 mL DEVI Inject 0.3 mLs (0.3 mg total) into the muscle once. 1 Device 5  . hydrOXYzine (ATARAX/VISTARIL) 25 MG tablet TAKE 1 TABLET (25 MG TOTAL) BY MOUTH 3 (THREE) TIMES DAILY AS NEEDED FOR ITCHING. 90 tablet 3  . Multiple Vitamin (MULTIVITAMIN) tablet Take 1 tablet by mouth daily.    . nitroGLYCERIN (NITROSTAT) 0.4 MG SL tablet Place 1 tablet (0.4 mg total) under the tongue every 5 (five) minutes as needed for chest pain. 25  tablet 3  . Omega-3 Fatty Acids (FISH OIL) 1000 MG CAPS Take 1 capsule by mouth daily.    . progesterone (PROMETRIUM) 200 MG capsule Take 200 mg by mouth daily.    . vitamin C (ASCORBIC ACID) 500 MG tablet Take 500 mg by mouth daily.    [name] HYDROcodone-acetaminophen (NORCO/VICODIN) 5-325 MG per tablet Take 1 tablet by mouth at bedtime as needed and may repeat dose one time if needed. May refill on or after Sep 26, 2014 60 tablet 0   No facility-administered medications prior to visit.    Review of Systems;  Patient denies headache, fevers, malaise, unintentional weight loss, skin rash, eye  pain, sinus congestion and sinus pain, sore throat, dysphagia,  hemoptysis , cough, dyspnea, wheezing, chest pain, palpitations, orthopnea, edema, abdominal pain, nausea, melena, diarrhea, constipation, flank pain, dysuria, hematuria, urinary  Frequency, nocturia, numbness, tingling, seizures,  Focal weakness, Loss of consciousness,  Tremor, insomnia, depression, anxiety, and suicidal ideation.      Objective:  BP 128/70 mmHg  Pulse 87  Temp(Src) 98.4 F (36.9 C) (Oral)  Resp 16  Ht  (1.626 m)  Wt 156 lb 4 oz (70.875 kg)  BMI 26.81 kg/m2  SpO2 96%  BP Readings from Last 3 Encounters:  10/20/14 128/70  08/17/14 126/64  08/17/14 136/66    Wt Readings from Last 3 Encounters:  10/20/14 156 lb 4 oz (70.875 kg)  08/17/14 157 lb 4 oz (71.328 kg)  08/17/14 158 lb (71.668 kg)    General appearance: alert, cooperative and appears stated age Ears: normal TM's and external ear canals both ears Throat: lips, mucosa, and tongue normal; teeth and gums normal Neck: no adenopathy, no carotid bruit, supple, symmetrical, trachea midline and thyroid not enlarged, symmetric, no tenderness/mass/nodules Back: symmetric, no curvature. ROM normal. No CVA tenderness. Lungs: clear to auscultation bilaterally Heart: regular rate and rhythm, S1, S2 normal, no murmur, click, rub or gallop Abdomen: soft, non-tender; bowel sounds normal; no masses,  no organomegaly Pulses: 2+ and symmetric Skin: Skin color, texture, turgor normal. No rashes or lesions Lymph nodes: Cervical, supraclavicular, and axillary nodes normal.   Assessment & Plan:   Problem List Items Addressed This Visit    Subacromial bursitis    Diagnosed by ultrasound March 2016, treated with steroid injection       Chronic lower back pain    Patient has been requesting monthly refills on Vicodin with use of 2 per day.  There is no prior evaluation with MRI. This needs to be done to determine the cause of her pain .       Relevant  Medications   HYDROcodone-acetaminophen (NORCO/VICODIN) 5-325 MG per tablet   Inguinal adenopathy    CT scan failed to show any suspicious LNs or LAD , just two prominent inguinal nodes on the left ,  Both < 1 cm.  Pain has resolved,  workup for lymphoma with PET scan was negative.  Reassurance provided.       Pancreatic cyst    Locaged on the tail, incident finding on recent CT.  MRI follow up in one year.       Pulmonary nodules/lesions, multiple    Multiple sub centimeter, noted on recent CT .  Follow up CT 6 months advised given history of tobacco abuse       Epidermal inclusion cyst    Non enflamed currently  ,  On sternum.  Advised to see Dermatology for surgical excision if desired  Hyperlipidemia    Other Visit Diagnoses    Other secondary osteoarthritis of both shoulders    -  Primary    Relevant Medications    HYDROcodone-acetaminophen (NORCO/VICODIN) 5-325 MG per tablet       I have discontinued Ms. Wanzer's HYDROcodone-acetaminophen and HYDROcodone-acetaminophen. I have also changed her HYDROcodone-acetaminophen. Additionally, I am having her start on Diclofenac Sodium. Lastly, I am having her maintain her Cholecalciferol (VITAMIN D PO), Calcium Carbonate-Vit D-Min (CALCIUM 1200 PO), CINNAMON PO, vitamin C, Fish Oil, multivitamin, progesterone, Docusate Calcium (STOOL SOFTENER PO), EPINEPHrine, nitroGLYCERIN, Cyanocobalamin (VITAMIN B 12 PO), cyclobenzaprine, hydrOXYzine, clobetasol ointment, doxycycline, and buPROPion.  Meds ordered this encounter  Medications  . DISCONTD: HYDROcodone-acetaminophen (NORCO/VICODIN) 5-325 MG per tablet    Sig: Take 1 tablet by mouth at bedtime as needed and may repeat dose one time if needed. May refill on or after October 27, 2014    Dispense:  60 tablet    Refill:  0  . DISCONTD: HYDROcodone-acetaminophen (NORCO/VICODIN) 5-325 MG per tablet    Sig: Take 1 tablet by mouth at bedtime as needed and may repeat dose one time if needed.  May refill on or after November 26, 2014    Dispense:  60 tablet    Refill:  0  . HYDROcodone-acetaminophen (NORCO/VICODIN) 5-325 MG per tablet    Sig: Take 1 tablet by mouth at bedtime as needed and may repeat dose one time if needed. May refill on or after December 27, 2014    Dispense:  60 tablet    Refill:  0  . Diclofenac Sodium 1.5 % SOLN    Sig: Apply to painful joint twice daily    Dispense:  150 mL    Refill:  3   A total of 40 minutes was spent with patient more than half of which was spent in counseling patient on the above mentioned issues , reviewing and explaining recent labs and imaging studies done, and coordination of care.   Medications Discontinued During This Encounter  Medication Reason  . HYDROcodone-acetaminophen (NORCO/VICODIN) 5-325 MG per tablet Reorder  . HYDROcodone-acetaminophen (NORCO/VICODIN) 5-325 MG per tablet Reorder  . HYDROcodone-acetaminophen (NORCO/VICODIN) 5-325 MG per tablet Reorder    Follow-up: Return in about 6 months (around 04/22/2015).   Sherlene ShamsULLO, Eunique Balik L, MD

## 2014-10-20 NOTE — Progress Notes (Signed)
Pre-visit discussion using our clinic review tool. No additional management support is needed unless otherwise documented below in the visit note.  

## 2014-10-20 NOTE — Patient Instructions (Addendum)
There was nothing to suggest lymphoma on your Ct scan  Lots of little things that need monitoring over time :  Repeat chest CT to follow up on 2 < 1 cm nodules   In 6 mont  Pancreatic cyst: Needs 1 year follow up with MRI   The bump on your chest wall is an epidermal cyst  and it can get infected from skin bacteria,   It can be removed any time you want

## 2014-10-24 ENCOUNTER — Encounter: Payer: Self-pay | Admitting: Internal Medicine

## 2014-10-24 DIAGNOSIS — K862 Cyst of pancreas: Secondary | ICD-10-CM | POA: Insufficient documentation

## 2014-10-24 DIAGNOSIS — L720 Epidermal cyst: Secondary | ICD-10-CM | POA: Insufficient documentation

## 2014-10-24 DIAGNOSIS — R918 Other nonspecific abnormal finding of lung field: Secondary | ICD-10-CM | POA: Insufficient documentation

## 2014-10-24 NOTE — Assessment & Plan Note (Addendum)
CT scan failed to show any suspicious LNs or LAD , just two prominent inguinal nodes on the left ,  Both < 1 cm.  Pain has resolved,  workup for lymphoma with PET scan was negative.  Reassurance provided.

## 2014-10-24 NOTE — Assessment & Plan Note (Addendum)
Multiple sub centimeter, noted on recent CT .  Follow up CT 6 months advised given history of tobacco abuse

## 2014-10-24 NOTE — Assessment & Plan Note (Signed)
Locaged on the tail, incident finding on recent CT.  MRI follow up in one year.

## 2014-10-24 NOTE — Assessment & Plan Note (Signed)
Patient has been requesting monthly refills on Vicodin with use of 2 per day.  There is no prior evaluation with MRI. This needs to be done to determine the cause of her pain .

## 2014-10-24 NOTE — Assessment & Plan Note (Signed)
Diagnosed by ultrasound March 2016, treated with steroid injection

## 2014-10-24 NOTE — Assessment & Plan Note (Signed)
Non enflamed currently  ,  On sternum.  Advised to see Dermatology for surgical excision if desired

## 2014-10-27 ENCOUNTER — Encounter: Payer: Self-pay | Admitting: *Deleted

## 2014-11-03 ENCOUNTER — Telehealth: Payer: Self-pay | Admitting: *Deleted

## 2014-11-03 NOTE — Telephone Encounter (Signed)
Pt called in stated she was having chest pressure she could feel in her head and neck and SOB that has been happening off and on for a few days. Pt stated is usually happens at night but today it has happened a few times while sitting at her desk at work.  Pt got worried because it was happening during the day.  At night she can usually lay down and take a few deep breaths and it will get better and she can go to sleep.  Pt stated she has some Propanolol at home that she has taken a few times to but it has not helped.  When asked pt stated she has not tried to take her nitroglycerin and that it is at home right now. Pt instructed to take her nitroglycerin, instructions given, and that if does not get better to go to the emgency room to be evaluated. Pt also instructed to check BP regularly and not to take the Propanolol with the Nitro.   Pt called back needing Nitro RX sent in as hers was expired.  Pt instructed to go to the ED to be evaluated as she has not seen Dr. Mariah MillingGollan since 07/2012 and she needs to be evaluated by a physcian.  Pt stated she will go to the Urgent Care told her that was fine and they will direct her accordingly. Pt stated she will call back to setup appointment.

## 2014-11-03 NOTE — Telephone Encounter (Signed)
°  1. Which medications need to be refilled? Nitroglycerin   2. Which pharmacy is medication to be sent to? [facility] in [place]   3. Do they need a 30 day or 90 day supply? 30   4. Would they like a call back once the medication has been sent to the pharmacy? No

## 2014-11-03 NOTE — Telephone Encounter (Signed)
Patient c/o Palpitations:  High priority if patient c/o lightheadedness and shortness of breath.  1. How long have you been having palpitations? Couple weeks. It would only happen at night, but now it is going on during the day as well.   2. Are you currently experiencing lightheadedness and shortness of breath? Feel like someone is cutting her thoat, and heaviness in her chest.   3. Have you checked your BP and heart rate? (document readings) no.   4. Are you experiencing any other symptoms? Just feels like her heart is coming out.

## 2014-11-24 ENCOUNTER — Other Ambulatory Visit: Payer: Self-pay | Admitting: Internal Medicine

## 2014-11-24 DIAGNOSIS — Z1239 Encounter for other screening for malignant neoplasm of breast: Secondary | ICD-10-CM

## 2014-11-24 NOTE — Progress Notes (Signed)
Patient called for Mammogram order. Order entered.

## 2014-11-25 ENCOUNTER — Encounter: Payer: Self-pay | Admitting: Internal Medicine

## 2014-12-02 ENCOUNTER — Encounter: Payer: Self-pay | Admitting: Internal Medicine

## 2014-12-07 ENCOUNTER — Ambulatory Visit
Admission: RE | Admit: 2014-12-07 | Discharge: 2014-12-07 | Disposition: A | Discharge status: Home / Self Care | Payer: 59 | Source: Ambulatory Visit | Attending: Internal Medicine | Admitting: Internal Medicine

## 2014-12-07 DIAGNOSIS — Z1231 Encounter for screening mammogram for malignant neoplasm of breast: Secondary | ICD-10-CM | POA: Diagnosis not present

## 2014-12-07 DIAGNOSIS — Z1239 Encounter for other screening for malignant neoplasm of breast: Secondary | ICD-10-CM

## 2014-12-15 LAB — LIPID PANEL
Cholesterol: 139 mg/dL (ref 0–200)
HDL: 31 mg/dL — AB (ref 35–70)
LDL Cholesterol: 80 mg/dL
Triglycerides: 141 mg/dL (ref 40–160)

## 2014-12-15 LAB — TSH: TSH: 0.11 u[IU]/mL — AB (ref 0.41–5.90)

## 2014-12-16 ENCOUNTER — Encounter: Payer: Self-pay | Admitting: Internal Medicine

## 2014-12-20 ENCOUNTER — Telehealth: Payer: Self-pay | Admitting: Internal Medicine

## 2014-12-20 NOTE — Telephone Encounter (Signed)
My chart message sent,  Her thyroid is  overactive,  Need to confimr current dose of thyroid med so I can  Lower dose

## 2014-12-20 NOTE — Telephone Encounter (Signed)
Patient was on Levothyroxine 100 mcg  Patient stated she has not been taking please advise up dated med list with the levothyroxine 100 mcg.

## 2014-12-20 NOTE — Telephone Encounter (Signed)
Patient has not taken any for at least 2 months.

## 2014-12-20 NOTE — Telephone Encounter (Signed)
Do not resume levothyroxine,  Recheck tsh and free t4 in 6 weeks.

## 2014-12-20 NOTE — Telephone Encounter (Signed)
How long has been been off of thyroid medications'?

## 2014-12-20 NOTE — Telephone Encounter (Signed)
Patient notified and voiced understanding will set up appoint on 01/04/15

## 2014-12-24 ENCOUNTER — Ambulatory Visit: Payer: 59 | Admitting: Internal Medicine

## 2014-12-24 ENCOUNTER — Other Ambulatory Visit: Payer: 59

## 2014-12-27 ENCOUNTER — Ambulatory Visit: Payer: 59 | Admitting: Cardiovascular Disease

## 2015-01-04 ENCOUNTER — Encounter: Payer: Self-pay | Admitting: Internal Medicine

## 2015-01-04 ENCOUNTER — Ambulatory Visit (INDEPENDENT_AMBULATORY_CARE_PROVIDER_SITE_OTHER): Payer: 59 | Admitting: Internal Medicine

## 2015-01-04 VITALS — BP 136/78 | HR 89 | Temp 98.0°F | Resp 14 | Ht 64.0 in | Wt 156.5 lb

## 2015-01-04 DIAGNOSIS — M19212 Secondary osteoarthritis, left shoulder: Secondary | ICD-10-CM

## 2015-01-04 DIAGNOSIS — K862 Cyst of pancreas: Secondary | ICD-10-CM

## 2015-01-04 DIAGNOSIS — R918 Other nonspecific abnormal finding of lung field: Secondary | ICD-10-CM | POA: Diagnosis not present

## 2015-01-04 DIAGNOSIS — E0590 Thyrotoxicosis, unspecified without thyrotoxic crisis or storm: Secondary | ICD-10-CM

## 2015-01-04 DIAGNOSIS — R599 Enlarged lymph nodes, unspecified: Secondary | ICD-10-CM

## 2015-01-04 DIAGNOSIS — M19211 Secondary osteoarthritis, right shoulder: Secondary | ICD-10-CM

## 2015-01-04 DIAGNOSIS — E063 Autoimmune thyroiditis: Secondary | ICD-10-CM | POA: Insufficient documentation

## 2015-01-04 DIAGNOSIS — R591 Generalized enlarged lymph nodes: Secondary | ICD-10-CM

## 2015-01-04 DIAGNOSIS — R590 Localized enlarged lymph nodes: Secondary | ICD-10-CM

## 2015-01-04 MED ORDER — HYDROCODONE-ACETAMINOPHEN 5-325 MG PO TABS: 1.0000 | ORAL_TABLET | Freq: Every evening | ORAL | Status: DC | PRN

## 2015-01-04 NOTE — Patient Instructions (Signed)
I would like you to get the additional labs soon  Ultrasound of thyroid/neck has been ordered  If the thyroid tests are abnormal,  I will recommend seeing an endocrinologist

## 2015-01-04 NOTE — Assessment & Plan Note (Signed)
Repeat CT chest will be due in November.

## 2015-01-04 NOTE — Progress Notes (Signed)
Pre-visit discussion using our clinic review tool. No additional management support is needed unless otherwise documented below in the visit note.  

## 2015-01-04 NOTE — Assessment & Plan Note (Signed)
No known history of pancreatitis but has recurrent episode sf abd pain attributted to IBS and diverticultiit,s  MRI advised in one year (May 2017)

## 2015-01-04 NOTE — Assessment & Plan Note (Addendum)
I suspect the conversion occurred in April when she present with diffuse lymphadenopathy of uncertain etiology that resolved. TPO and thyroglobulin ab ordered. I

## 2015-01-04 NOTE — Progress Notes (Signed)
Subjective:  Patient ID: Cheryl [name], female    DOB: 21-Jul-1955  Age: 59 y.o. MRN: [id number]  CC: The primary encounter diagnosis was Hyperthyroidism. Diagnoses of Lymphadenopathy of head and neck, Other secondary osteoarthritis of both shoulders, Pulmonary nodules/lesions, multiple, Pancreatic cyst, and Inguinal adenopathy were also pertinent to this visit.  HPI Cheryl [name] presents for OVERACTIVE THYROID. Patient has history of hypothyrodism diagnosed several years ago as an incidental  finding. Last 2 tshs were suppressed despite stopping levothyroxine   Over 6 weeks prior the last tsh.     Denies taking any "energy" supplements .  Has felt more tired , not hungry,  And more bloated. Bowels moving regularly. Tyring to lose weight but not successful ,  Developed heatstroke from working in the yard 2 weekends ago with severe headaches  And nausea and vomiting  all weekend but did not go to ER  Strong FH of thyroid problems , mother may have had hpothyroidism but was < 100 lbs      Outpatient Prescriptions Prior to Visit  Medication Sig Dispense Refill  . buPROPion (WELLBUTRIN SR) 100 MG 12 hr tablet Take 1 tablet by mouth two  times daily 180 tablet 0  . Calcium Carbonate-Vit D-Min (CALCIUM 1200 PO) Take 1 tablet by mouth daily.    . Cholecalciferol (VITAMIN D PO) Take by mouth. Pt takes 5000iu daily    . CINNAMON PO Take 2,000 mg by mouth daily.    . clobetasol ointment (TEMOVATE) 0.05 % Apply 1 application topically 2 (two) times daily. Until resolved 30 g 2  . Cyanocobalamin (VITAMIN B 12 PO) Take by mouth.    . cyclobenzaprine (FLEXERIL) 10 MG tablet TAKE 1 TABLET (10 MG TOTAL) BY MOUTH AT BEDTIME AS NEEDED FOR MUSCLE SPASMS. 30 tablet 1  . Diclofenac Sodium 1.5 % SOLN Apply to painful joint twice daily 150 mL 3  . Docusate Calcium (STOOL SOFTENER PO) Take by mouth.    . doxycycline (VIBRA-TABS) 100 MG tablet Take 1 tablet (100 mg total) by mouth 2 (two) times daily.  14 tablet 0  . EPINEPHrine (EPIPEN) 0.3 mg/0.3 mL DEVI Inject 0.3 mLs (0.3 mg total) into the muscle once. 1 Device 5  . hydrOXYzine (ATARAX/VISTARIL) 25 MG tablet TAKE 1 TABLET (25 MG TOTAL) BY MOUTH 3 (THREE) TIMES DAILY AS NEEDED FOR ITCHING. 90 tablet 3  . levothyroxine (SYNTHROID, LEVOTHROID) 100 MCG tablet Take 100 mcg by mouth daily.    . Multiple Vitamin (MULTIVITAMIN) tablet Take 1 tablet by mouth daily.    . nitroGLYCERIN (NITROSTAT) 0.4 MG SL tablet Place 1 tablet (0.4 mg total) under the tongue every 5 (five) minutes as needed for chest pain. 25 tablet 3  . Omega-3 Fatty Acids (FISH OIL) 1000 MG CAPS Take 1 capsule by mouth daily.    . progesterone (PROMETRIUM) 200 MG capsule Take 200 mg by mouth daily.    . vitamin C (ASCORBIC ACID) 500 MG tablet Take 500 mg by mouth daily.    [name] HYDROcodone-acetaminophen (NORCO/VICODIN) 5-325 MG per tablet Take 1 tablet by mouth at bedtime as needed and may repeat dose one time if needed. May refill on or after December 27, 2014 60 tablet 0   No facility-administered medications prior to visit.    Review of Systems;  Patient denies headache, fevers, malaise, unintentional weight loss, skin rash, eye pain, sinus congestion and sinus pain, sore throat, dysphagia,  hemoptysis , cough, dyspnea, wheezing, chest pain, palpitations, orthopnea, edema, abdominal  pain, nausea, melena, diarrhea, constipation, flank pain, dysuria, hematuria, urinary  Frequency, nocturia, numbness, tingling, seizures,  Focal weakness, Loss of consciousness,  Tremor, insomnia, depression, anxiety, and suicidal ideation.      Objective:  BP 136/78 mmHg  Pulse 89  Temp(Src) 98 F (36.7 C) (Oral)  Resp 14  Ht 5\' 4"  (1.626 m)  Wt 156 lb 8 oz (70.988 kg)  BMI 26.85 kg/m2  SpO2 95%  BP Readings from Last 3 Encounters:  01/04/15 136/78  10/20/14 128/70  08/17/14 126/64    Wt Readings from Last 3 Encounters:  01/04/15 156 lb 8 oz (70.988 kg)  10/20/14 156 lb 4 oz (70.875  kg)  08/17/14 157 lb 4 oz (71.328 kg)    General appearance: alert, cooperative and appears stated age Ears: normal TM's and external ear canals both ears Throat: lips, mucosa, and tongue normal; teeth and gums normal Neck: no adenopathy, no carotid bruit, supple, symmetrical, trachea midline and thyroid not enlarged, symmetric, no tenderness/mass/nodules Back: symmetric, no curvature. ROM normal. No CVA tenderness. Lungs: clear to auscultation bilaterally Heart: regular rate and rhythm, S1, S2 normal, no murmur, click, rub or gallop Abdomen: soft, non-tender; bowel sounds normal; no masses,  no organomegaly Pulses: 2+ and symmetric Skin: Skin color, texture, turgor normal. No rashes or lesions Lymph nodes: Cervical, supraclavicular, and axillary nodes normal.  Lab Results  Component Value Date   HGBA1C 5.4 11/25/2013    Lab Results  Component Value Date   CREATININE 0.95 09/01/2014   CREATININE 0.9 06/10/2014   CREATININE 1.0 12/25/2013    Lab Results  Component Value Date   WBC 10.8 09/01/2014   HGB 9.5* 09/01/2014   HCT 29.8* 09/01/2014   PLT 301 09/01/2014   GLUCOSE 86 08/15/2012   CHOL 139 12/15/2014   TRIG 141 12/15/2014   HDL 31* 12/15/2014   LDLDIRECT 104* 08/15/2012   LDLCALC 80 12/15/2014   ALT 23 09/01/2014   AST 21 09/01/2014   NA 140 06/10/2014   K 4.6 06/10/2014   CL 106 08/15/2012   CREATININE 0.95 09/01/2014   BUN 12 06/10/2014   CO2 27 08/15/2012   TSH 0.11* 12/15/2014   INR 1.0 09/01/2014   HGBA1C 5.4 11/25/2013    Mm Digital Screening  12/08/2014   CLINICAL DATA:  Screening.  EXAM: DIGITAL SCREENING BILATERAL MAMMOGRAM WITH CAD  COMPARISON:  Previous exam(s).  ACR Breast Density Category c: The breast tissue is heterogeneously dense, which may obscure small masses.  FINDINGS: There are no findings suspicious for malignancy. Images were processed with CAD.  IMPRESSION: No mammographic evidence of malignancy. A result letter of this screening  mammogram will be mailed directly to the patient.  RECOMMENDATION: Screening mammogram in one year. (Code:SM-B-01Y)  BI-RADS CATEGORY  1: Negative.   Electronically Signed   By: Annia Belt M.D.   On: 12/08/2014 08:13    Assessment & Plan:   Problem List Items Addressed This Visit      Unprioritized   Inguinal adenopathy    Accompanied by pain and diffuse LAD with no obvious etiology, now all clinially resolved.       Pancreatic cyst    No known history of pancreatitis but has recurrent episode sf abd pain attributted to IBS and diverticultiit,s  MRI advised in one year (May 2017)       Pulmonary nodules/lesions, multiple    Repeat CT chest will be due in November.       Hyperthyroidism - Primary  I suspect the conversion occurred in April when she present with diffuse lymphadenopathy of uncertain etiology that resolved. TPO and thyroglobulin ab ordered. I      Relevant Orders   US Soft Tissue Head/Neck    Other Visit Diagnoses    Lymphadenopathy of head and neck        Relevant Orders    US Soft Tissue Head/Neck    Other secondary osteoarthritis of both shoulders        Relevant Medications    HYDROcodone-acetaminophen (NORCO/VICODIN) 5-325 MG per tablet       I have changed Ms. Auguste's HYDROcodone-acetaminophen. I am also having her maintain her Cholecalciferol (VITAMIN D PO), Calcium Carbonate-Vit D-Min (CALCIUM 1200 PO), CINNAMON PO, vitamin C, Fish Oil, multivitamin, progesterone, Docusate Calcium (STOOL SOFTENER PO), EPINEPHrine, nitroGLYCERIN, Cyanocobalamin (VITAMIN B 12 PO), cyclobenzaprine, hydrOXYzine, clobetasol ointment, doxycycline, buPROPion, Diclofenac Sodium, and levothyroxine.  Meds ordered this encounter  Medications  . HYDROcodone-acetaminophen (NORCO/VICODIN) 5-325 MG per tablet    Sig: Take 1 tablet by mouth at bedtime as needed and may repeat dose one time if needed. May refill on or after January 27, 2015    Dispense:  60 tablet    Refill:  0      Medications Discontinued During This Encounter  Medication Reason  . HYDROcodone-acetaminophen (NORCO/VICODIN) 5-325 MG per tablet Reorder    Follow-up: No Follow-up on file.   Sherlene Shams, MD

## 2015-01-05 ENCOUNTER — Other Ambulatory Visit: Payer: 59

## 2015-01-05 ENCOUNTER — Encounter: Payer: Self-pay | Admitting: Internal Medicine

## 2015-01-05 ENCOUNTER — Ambulatory Visit: Payer: 59 | Admitting: Internal Medicine

## 2015-01-05 NOTE — Assessment & Plan Note (Signed)
Accompanied by pain and diffuse LAD with no obvious etiology, now all clinially resolved.

## 2015-01-07 ENCOUNTER — Ambulatory Visit
Admission: RE | Admit: 2015-01-07 | Discharge: 2015-01-07 | Disposition: A | Discharge status: Home / Self Care | Payer: 59 | Source: Ambulatory Visit | Attending: Internal Medicine | Admitting: Internal Medicine

## 2015-01-07 DIAGNOSIS — R599 Enlarged lymph nodes, unspecified: Secondary | ICD-10-CM | POA: Insufficient documentation

## 2015-01-07 DIAGNOSIS — E039 Hypothyroidism, unspecified: Secondary | ICD-10-CM

## 2015-01-07 DIAGNOSIS — E0590 Thyrotoxicosis, unspecified without thyrotoxic crisis or storm: Secondary | ICD-10-CM | POA: Insufficient documentation

## 2015-01-07 DIAGNOSIS — R591 Generalized enlarged lymph nodes: Secondary | ICD-10-CM

## 2015-01-08 ENCOUNTER — Other Ambulatory Visit: Payer: Self-pay | Admitting: Internal Medicine

## 2015-01-08 DIAGNOSIS — E0590 Thyrotoxicosis, unspecified without thyrotoxic crisis or storm: Secondary | ICD-10-CM

## 2015-01-09 ENCOUNTER — Telehealth: Payer: Self-pay | Admitting: Internal Medicine

## 2015-01-09 ENCOUNTER — Encounter: Payer: Self-pay | Admitting: Internal Medicine

## 2015-01-09 NOTE — Telephone Encounter (Signed)
  Message sent via Mychart :    Your thyroid ultrasound was essentially normal except for a tiny, 3 mm nodule which is too small to meet criteria for biopsy .  It's very possible that this tiny nodule has become an overactive,  autonomous (working independently) thyroid gland,  Which can be confirmed or disproven with a thyroid uptake scan (another imaging study that can be done at [facility]).   if you would like to go ahead and have the scan done, let me know.  The other possible cause of your sudden conversion to hyperthyroidism is that it happended during that episode of viral illness  hyou had several months ago when your lymph nodes were swollen and painful.  This can occur because the body accidentally makes antibodies against your own thyroid gland during the time that it is fighting a viral infection.  The antibody tests that I ordered will help [place] determine if that is the case.  .   If you would like to wait until the additional blood tests are done before doing anything , that would be fine.  I can also refer you to an endocrinologist (a doctor who specializes in thyroid disorders) before we so any more testing,  If you prefer.  [facility] has 2 excellent endocrinologists in [place], and [facility] has two that are in [place] that are excellent as well.  Regards,  Dr. Darrick Huntsman

## 2015-01-10 NOTE — Telephone Encounter (Signed)
Left message for patient to call office.  

## 2015-01-12 LAB — TSH: TSH: 1.9 u[IU]/mL (ref 0.41–5.90)

## 2015-01-12 LAB — HEPATIC FUNCTION PANEL
ALT: 21 U/L (ref 7–35)
AST: 22 U/L (ref 13–35)
Alkaline Phosphatase: 74 U/L (ref 25–125)
Bilirubin, Total: 0.5 mg/dL

## 2015-01-12 LAB — BASIC METABOLIC PANEL
BUN: 13 mg/dL (ref 4–21)
Creatinine: 0.9 mg/dL (ref 0.5–1.1)
Glucose: 99 mg/dL
Potassium: 4.3 mmol/L (ref 3.4–5.3)
Sodium: 140 mmol/L (ref 137–147)

## 2015-01-12 NOTE — Telephone Encounter (Signed)
Patient aware via Mychart

## 2015-01-13 ENCOUNTER — Encounter: Payer: Self-pay | Admitting: Internal Medicine

## 2015-01-16 ENCOUNTER — Telehealth: Payer: Self-pay | Admitting: Internal Medicine

## 2015-01-16 NOTE — Telephone Encounter (Signed)
MyChart message sent : normal thyroid level

## 2015-01-22 ENCOUNTER — Encounter: Payer: Self-pay | Admitting: Internal Medicine

## 2015-01-25 NOTE — Telephone Encounter (Signed)
Patient requesting referral to have Nodule between breast removed stated she had discussed with MD at previous visit.

## 2015-01-26 ENCOUNTER — Other Ambulatory Visit: Payer: Self-pay | Admitting: Internal Medicine

## 2015-01-26 DIAGNOSIS — L720 Epidermal cyst: Secondary | ICD-10-CM

## 2015-01-27 ENCOUNTER — Encounter: Payer: Self-pay | Admitting: Internal Medicine

## 2015-01-28 ENCOUNTER — Encounter: Payer: Self-pay | Admitting: Internal Medicine

## 2015-02-15 ENCOUNTER — Ambulatory Visit: Payer: 59 | Admitting: Cardiovascular Disease

## 2015-02-24 ENCOUNTER — Telehealth: Payer: Self-pay | Admitting: Internal Medicine

## 2015-02-24 DIAGNOSIS — M19211 Secondary osteoarthritis, right shoulder: Secondary | ICD-10-CM

## 2015-02-24 DIAGNOSIS — M19212 Secondary osteoarthritis, left shoulder: Principal | ICD-10-CM

## 2015-02-24 NOTE — Telephone Encounter (Signed)
Pt called needing her prescription for Hydrocodone to pick up. Thank You!

## 2015-02-25 MED ORDER — HYDROCODONE-ACETAMINOPHEN 5-325 MG PO TABS: 1.0000 | ORAL_TABLET | Freq: Every evening | ORAL | Status: DC | PRN

## 2015-02-25 NOTE — Telephone Encounter (Signed)
Please advise on refill of Hydrocodone.

## 2015-02-25 NOTE — Telephone Encounter (Signed)
LM for patient that RX would be up front for her to pick up

## 2015-02-25 NOTE — Telephone Encounter (Signed)
Ok to refill,  printed rx for 2 months  

## 2015-03-21 LAB — CBC AND DIFFERENTIAL
HCT: 30 % — AB (ref 36–46)
Hemoglobin: 9.9 g/dL — AB (ref 12.0–16.0)
Platelets: 322 10*3/uL (ref 150–399)
WBC: 8.4 10^3/mL

## 2015-03-21 LAB — HEPATIC FUNCTION PANEL
ALT: 33 U/L (ref 7–35)
AST: 28 U/L (ref 13–35)
Alkaline Phosphatase: 74 U/L (ref 25–125)
Bilirubin, Total: 1 mg/dL

## 2015-03-21 LAB — LIPID PANEL
Cholesterol: 136 mg/dL (ref 0–200)
HDL: 29 mg/dL — AB (ref 35–70)
LDL Cholesterol: 73 mg/dL
LDl/HDL Ratio: 2.5
Triglycerides: 170 mg/dL — AB (ref 40–160)

## 2015-03-21 LAB — BASIC METABOLIC PANEL
BUN: 11 mg/dL (ref 4–21)
Creatinine: 0.9 mg/dL (ref 0.5–1.1)
Glucose: 89 mg/dL
Potassium: 4.6 mmol/L (ref 3.4–5.3)
Sodium: 137 mmol/L (ref 137–147)

## 2015-03-21 LAB — TSH: TSH: 1.62 u[IU]/mL (ref 0.41–5.90)

## 2015-03-24 ENCOUNTER — Encounter: Payer: Self-pay | Admitting: Internal Medicine

## 2015-03-25 ENCOUNTER — Telehealth: Payer: Self-pay | Admitting: Internal Medicine

## 2015-03-28 ENCOUNTER — Ambulatory Visit (INDEPENDENT_AMBULATORY_CARE_PROVIDER_SITE_OTHER): Payer: 59 | Admitting: Cardiovascular Disease

## 2015-03-28 ENCOUNTER — Encounter: Payer: Self-pay | Admitting: Cardiovascular Disease

## 2015-03-28 VITALS — BP 130/70 | HR 78 | Ht 63.0 in | Wt 160.8 lb

## 2015-03-28 DIAGNOSIS — R0789 Other chest pain: Secondary | ICD-10-CM

## 2015-03-28 DIAGNOSIS — I447 Left bundle-branch block, unspecified: Secondary | ICD-10-CM | POA: Diagnosis not present

## 2015-03-28 DIAGNOSIS — R002 Palpitations: Secondary | ICD-10-CM | POA: Diagnosis not present

## 2015-03-28 DIAGNOSIS — Z0000 Encounter for general adult medical examination without abnormal findings: Secondary | ICD-10-CM

## 2015-03-28 MED ORDER — METOPROLOL TARTRATE 25 MG PO TABS: 25.0000 mg | ORAL_TABLET | Freq: Two times a day (BID) | ORAL | Status: DC | PRN

## 2015-03-28 MED ORDER — NITROGLYCERIN 0.4 MG SL SUBL: 0.4000 mg | SUBLINGUAL_TABLET | SUBLINGUAL | Status: DC | PRN

## 2015-03-28 NOTE — Assessment & Plan Note (Signed)
Likely APCs or PVCs. Does not seem to last like atrial fibrillation or other arrhythmia. Holter monitor or 30 day monitor ordered if symptoms get worse For now we'll start metoprolol tartrate 25 mg in the evening

## 2015-03-28 NOTE — Patient Instructions (Addendum)
You are doing well.  Please take metoprolol at night for palpitations  Please call us if you have new issues that need to be addressed before your next appt.  Your physician wants you to follow-up in: 12 months.  You will receive a reminder letter in the mail two months in advance. If you don't receive a letter, please call our office to schedule the follow-up appointment.  Palpitations A palpitation is the feeling that your heartbeat is irregular or is faster than normal. It may feel like your heart is fluttering or skipping a beat. Palpitations are usually not a serious problem. However, in some cases, you may need further medical evaluation. CAUSES  Palpitations can be caused by:  Smoking.  Caffeine or other stimulants, such as diet pills or energy drinks.  Alcohol.  Stress and anxiety.  Strenuous physical activity.  Fatigue.  Certain medicines.  Heart disease, especially if you have a history of irregular heart rhythms (arrhythmias), such as atrial fibrillation, atrial flutter, or supraventricular tachycardia.  An improperly working pacemaker or defibrillator. DIAGNOSIS  To find the cause of your palpitations, your health care provider will take your medical history and perform a physical exam. Your health care provider may also have you take a test called an ambulatory electrocardiogram (ECG). An ECG records your heartbeat patterns over a 24-hour period. You may also have other tests, such as:  Transthoracic echocardiogram (TTE). During echocardiography, sound waves are used to evaluate how blood flows through your heart.  Transesophageal echocardiogram (TEE).  Cardiac monitoring. This allows your health care provider to monitor your heart rate and rhythm in real time.  Holter monitor. This is a portable device that records your heartbeat and can help diagnose heart arrhythmias. It allows your health care provider to track your heart activity for several days, if  needed.  Stress tests by exercise or by giving medicine that makes the heart beat faster. TREATMENT  Treatment of palpitations depends on the cause of your symptoms and can vary greatly. Most cases of palpitations do not require any treatment other than time, relaxation, and monitoring your symptoms. Other causes, such as atrial fibrillation, atrial flutter, or supraventricular tachycardia, usually require further treatment. HOME CARE INSTRUCTIONS   Avoid:  Caffeinated coffee, tea, soft drinks, diet pills, and energy drinks.  Chocolate.  Alcohol.  Stop smoking if you smoke.  Reduce your stress and anxiety. Things that can help you relax include:  A method of controlling things in your body, such as your heartbeats, with your mind (biofeedback).  Yoga.  Meditation.  Physical activity such as swimming, jogging, or walking.  Get plenty of rest and sleep. SEEK MEDICAL CARE IF:   You continue to have a fast or irregular heartbeat beyond 24 hours.  Your palpitations occur more often. SEEK IMMEDIATE MEDICAL CARE IF:  You have chest pain or shortness of breath.  You have a severe headache.  You feel dizzy or you faint. MAKE SURE YOU:  Understand these instructions.  Will watch your condition.  Will get help right away if you are not doing well or get worse.   This information is not intended to replace advice given to you by your health care provider. Make sure you discuss any questions you have with your health care provider.   Document Released: 05/11/2000 Document Revised: 05/19/2013 Document Reviewed: 07/13/2011 Elsevier Interactive Patient Education [employer]2016 Elsevier Inc.

## 2015-03-28 NOTE — Assessment & Plan Note (Signed)
Prior atypical chest pain symptoms. Workup showing no ischemia. No further workup at this time

## 2015-03-28 NOTE — Progress Notes (Signed)
Patient ID: Cheryl [name], female    DOB: 10/24/1955, 59 y.o.   MRN: [id number]  HPI Comments: Cheryl [name] is a 59 year old woman, patient of Dr. Darrick Huntsman, with left bundle branch block, prior history of chest pain, workup dating back more than 10 years ago with stress testing, cardiac catheterization at [facility] with reportedly no significant CAD at that time in 2003, additional episodes of palpitations and chest pain, workup in June 2012 with stress testing/Myoview showing no ischemia who presents for follow-up of her palpitations. 41 year smoking history, stopped 2 years ago. Possible apneic periods when sleeping on her back. Rare episodes of tachycardia, once per month. Diagnosis of sleep apnea, has chronic fatigue Prior chest pain symptoms felt to be atypical in nature. Strong family history of CAD  In follow-up today, she reports that she has been having some palpitations at nighttime. Rarely feels these during the daytime Previously had propranolol which worked reasonably well. Also wakes up in the middle of the night with palpitations. Not consistent, palpitations are sporadic.  Reports being compliant with her CPAP  Review of her lab work shows total cholesterol 136  EKG on today's visit shows normal sinus rhythm with rate 78 bpm, left bundle branch block  Other past medical history Previous shoulder discomfort and was taking meloxicam.   Stress test 11/10/2010 showing no ischemia. Myoview study Outside echocardiogram 11/08/2010 showing normal ejection fraction 60%   Allergies  Allergen Reactions  . Peanuts [Peanut Oil]   . Penicillins Other (See Comments)    Hives,rash,nausea,swelling,   . Sulfa Antibiotics Other (See Comments)    Hives,rash,nausea,swelling  . Influenza Vac Split [Flu Virus Vaccine]     Pleurisy  1996  . Mobic [Meloxicam] Nausea Only  . Nickel     Current Outpatient Prescriptions on File Prior to Visit  Medication Sig Dispense Refill  . buPROPion  (WELLBUTRIN SR) 100 MG 12 hr tablet Take 1 tablet by mouth two  times daily 180 tablet 0  . Calcium Carbonate-Vit D-Min (CALCIUM 1200 PO) Take 1 tablet by mouth daily.    . Cholecalciferol (VITAMIN D PO) Take by mouth. Pt takes 5000iu daily    . CINNAMON PO Take 2,000 mg by mouth daily.    . clobetasol ointment (TEMOVATE) 0.05 % Apply 1 application topically 2 (two) times daily. Until resolved (Patient taking differently: Apply 1 application topically daily. Until resolved) 30 g 2  . Cyanocobalamin (VITAMIN B 12 PO) Take by mouth.    . Diclofenac Sodium 1.5 % SOLN Apply to painful joint twice daily 150 mL 3  . Docusate Calcium (STOOL SOFTENER PO) Take by mouth.    . EPINEPHrine (EPIPEN) 0.3 mg/0.3 mL DEVI Inject 0.3 mLs (0.3 mg total) into the muscle once. (Patient taking differently: Inject 0.3 mg into the muscle as needed. ) 1 Device 5  . HYDROcodone-acetaminophen (NORCO/VICODIN) 5-325 MG tablet Take 1 tablet by mouth at bedtime as needed and may repeat dose one time if needed. May refill on or after March 29, 2015 60 tablet 0  . hydrOXYzine (ATARAX/VISTARIL) 25 MG tablet TAKE 1 TABLET (25 MG TOTAL) BY MOUTH 3 (THREE) TIMES DAILY AS NEEDED FOR ITCHING. 90 tablet 3  . Multiple Vitamin (MULTIVITAMIN) tablet Take 1 tablet by mouth daily.    . Omega-3 Fatty Acids (FISH OIL) 1000 MG CAPS Take 1 capsule by mouth daily.    . progesterone (PROMETRIUM) 200 MG capsule Take 200 mg by mouth daily.    . vitamin C (ASCORBIC  ACID) 500 MG tablet Take 500 mg by mouth daily.     No current facility-administered medications on file prior to visit.    Past Medical History  Diagnosis Date  . Arthritis   . Blood in stool   . Chicken pox   . Generalized headaches   . History of blood transfusion   . Thyroid disease   . UTI (lower urinary tract infection)   . Arrhythmia     left bundle branch block  . LBBB (left bundle branch block)     Past Surgical History  Procedure Laterality Date  . Breast  surgery  1976  . Appendectomy  1981  . Tonsillectomy and adenoidectomy  1964  . Cystic fibrosis tumor removal  1983  . 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 Cardiac catheterization  2004    [facility]  . Cardiac catheterization  2006    [facility]  . Abdominal hysterectomy  1997  . Breast biopsy Bilateral     neg    Social History  reports that she quit smoking about 1 years ago. Her smoking use included Cigarettes. She has a 8.5 pack-year smoking history. She has never used smokeless tobacco. She reports that she drinks alcohol. She reports that she does not use illicit drugs.  Family History family history includes Arthritis in her father and mother; Breast cancer (age of onset: 7644) in her mother; Cancer in her father and mother; Diabetes in her brother, father, maternal aunt, maternal grandmother, paternal grandfather, paternal grandmother, and sister; Heart attack in her mother; Heart disease in her father, maternal grandfather, maternal grandmother, mother, paternal grandfather, paternal grandmother, and sister; Heart disease (age of onset: 5959) in her brother; Hyperlipidemia in her mother; Hypertension in her father and mother; Liver disease in her brother; Lung disease in her brother; Stroke in her paternal grandfather.    Review of Systems  Constitutional: Negative.   Respiratory: Negative.   Cardiovascular: Positive for palpitations.  Gastrointestinal: Negative.   Musculoskeletal: Negative.   Skin: Negative.   Neurological: Negative.   Psychiatric/Behavioral: Negative.   All other systems reviewed and are negative.   BP 130/70 mmHg  Pulse 78  Ht 5\' 3"  (1.6 m)  Wt 160 lb 12 oz (72.916 kg)  BMI 28.48 kg/m2  Physical Exam  Constitutional: She is oriented to person, place, and time. She appears well-developed and well-nourished.  HENT:  Head: Normocephalic.  Nose: Nose normal.  Mouth/Throat: Oropharynx is clear and moist.  Eyes: Conjunctivae are normal. Pupils are equal, round,  and reactive to light.  Neck: Normal range of motion. Neck supple. No JVD present.  Cardiovascular: Normal rate, regular rhythm, S1 normal, S2 normal, normal heart sounds and intact distal pulses.  Exam reveals no gallop and no friction rub.   No murmur heard. Pulmonary/Chest: Effort normal and breath sounds normal. No respiratory distress. She has no wheezes. She has no rales. She exhibits no tenderness.  Abdominal: Soft. Bowel sounds are normal. She exhibits no distension. There is no tenderness.  Musculoskeletal: Normal range of motion. She exhibits no edema or tenderness.  Lymphadenopathy:    She has no cervical adenopathy.  Neurological: She is alert and oriented to person, place, and time. Coordination normal.  Skin: Skin is warm and dry. No rash noted. No erythema.  Psychiatric: She has a normal mood and affect. Her behavior is normal. Judgment and thought content normal.    Assessment and Plan  Nursing note and vitals reviewed.

## 2015-04-13 ENCOUNTER — Encounter: Payer: Self-pay | Admitting: Internal Medicine

## 2015-04-13 ENCOUNTER — Ambulatory Visit (INDEPENDENT_AMBULATORY_CARE_PROVIDER_SITE_OTHER): Payer: 59 | Admitting: Internal Medicine

## 2015-04-13 VITALS — BP 122/64 | HR 82 | Temp 98.8°F | Resp 12 | Ht 64.0 in | Wt 160.0 lb

## 2015-04-13 DIAGNOSIS — E784 Other hyperlipidemia: Secondary | ICD-10-CM | POA: Diagnosis not present

## 2015-04-13 DIAGNOSIS — R5383 Other fatigue: Secondary | ICD-10-CM | POA: Diagnosis not present

## 2015-04-13 DIAGNOSIS — E0590 Thyrotoxicosis, unspecified without thyrotoxic crisis or storm: Secondary | ICD-10-CM

## 2015-04-13 DIAGNOSIS — E785 Hyperlipidemia, unspecified: Secondary | ICD-10-CM

## 2015-04-13 DIAGNOSIS — M19211 Secondary osteoarthritis, right shoulder: Secondary | ICD-10-CM

## 2015-04-13 DIAGNOSIS — M19212 Secondary osteoarthritis, left shoulder: Secondary | ICD-10-CM

## 2015-04-13 MED ORDER — HYDROCODONE-ACETAMINOPHEN 5-325 MG PO TABS: 1.0000 | ORAL_TABLET | Freq: Every evening | ORAL | Status: DC | PRN

## 2015-04-13 MED ORDER — HYDROCODONE-ACETAMINOPHEN 5-325 MG PO TABS
1.0000 | ORAL_TABLET | Freq: Two times a day (BID) | ORAL | Status: DC | PRN
Start: 2015-04-13 — End: 2015-04-13

## 2015-04-13 MED ORDER — HYDROCODONE-ACETAMINOPHEN 5-325 MG PO TABS
1.0000 | ORAL_TABLET | Freq: Two times a day (BID) | ORAL | Status: DC | PRN
Start: 2015-04-13 — End: 2015-08-01

## 2015-04-13 NOTE — Progress Notes (Signed)
Subjective:  Patient ID: Cheryl [name], female    DOB: 03/29/1956  Age: 59 y.o. MRN: [id number] .   CC: The primary encounter diagnosis was Other secondary osteoarthritis of both shoulders. Diagnoses of Other fatigue, Hyperthyroidism, and Dyslipidemia (high LDL; low HDL) were also pertinent to this visit.  HPI Cheryl LaineKathryn Ann Kalina presents for follow up on chronic conditions.    Cc: fatigue ,  Trouble staying awake when she gets home from work  asleep on couch by 8 pm.   wakes up feeling tiired,  Even on the weekends.     Hyperthyroidism,  Resolved,  antibody studies were nornal in August  Tsh recently in normal range   Pulmonary nodules  Due for follow up CT  this month .  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 buPROPion (WELLBUTRIN SR) 100 MG 12 hr tablet Take 1 tablet by mouth two  times daily 180 tablet 0  . Calcium Carbonate-Vit D-Min (CALCIUM 1200 PO) Take 1 tablet by mouth daily.    . Cholecalciferol (VITAMIN D PO) Take by mouth. Pt takes 5000iu daily    . CINNAMON PO Take 2,000 mg by mouth daily.    . clobetasol ointment (TEMOVATE) 0.05 % Apply 1 application topically 2 (two) times daily. Until resolved (Patient taking differently: Apply 1 application topically daily. Until resolved) 30 g 2  . Cyanocobalamin (VITAMIN B 12 PO) Take by mouth.    . Diclofenac Sodium 1.5 % SOLN Apply to painful joint twice daily 150 mL 3  . Docusate Calcium (STOOL SOFTENER PO) Take by mouth.    . EPINEPHrine (EPIPEN) 0.3 mg/0.3 mL DEVI Inject 0.3 mLs (0.3 mg total) into the muscle once. (Patient taking differently: Inject 0.3 mg into the muscle as needed. ) 1 Device 5  . hydrOXYzine (ATARAX/VISTARIL) 25 MG tablet TAKE 1 TABLET (25 MG TOTAL) BY MOUTH 3 (THREE) TIMES DAILY AS NEEDED FOR ITCHING. 90 tablet 3  . metoprolol tartrate (LOPRESSOR) 25 MG tablet Take 1 tablet (25 mg total) by  mouth 2 (two) times daily as needed. 180 tablet 3  . Multiple Vitamin (MULTIVITAMIN) tablet Take 1 tablet by mouth daily.    . nitroGLYCERIN (NITROSTAT) 0.4 MG SL tablet Place 1 tablet (0.4 mg total) under the tongue every 5 (five) minutes as needed for chest pain. 25 tablet 3  . Omega-3 Fatty Acids (FISH OIL) 1000 MG CAPS Take 1 capsule by mouth daily.    . progesterone (PROMETRIUM) 200 MG capsule Take 200 mg by mouth daily.    . vitamin C (ASCORBIC ACID) 500 MG tablet Take 500 mg by mouth daily.    [name]. HYDROcodone-acetaminophen (NORCO/VICODIN) 5-325 MG tablet Take 1 tablet by mouth at bedtime as needed and may repeat dose one time if needed. May refill on or after March 29, 2015 60 tablet 0   No facility-administered medications prior to visit.    Review of Systems;  Patient denies headache, fevers, malaise, unintentional weight loss, skin rash, eye pain, sinus congestion and sinus pain, sore throat, dysphagia,  hemoptysis , cough, dyspnea, wheezing, chest pain, palpitations, orthopnea, edema, abdominal pain, nausea, melena, diarrhea, constipation, flank pain, dysuria, hematuria, urinary  Frequency, nocturia, numbness, tingling, seizures,  Focal weakness, Loss of consciousness,  Tremor, insomnia, depression, anxiety, and suicidal ideation.      Objective:  BP 122/64 mmHg  Pulse 82  Temp(Src) 98.8 F (  37.1 C) (Oral)  Resp 12  Ht  (1.626 m)  Wt 160 lb (72.576 kg)  BMI 27.45 kg/m2  SpO2 95%  BP Readings from Last 3 Encounters:  04/13/15 122/64  03/28/15 130/70  01/04/15 136/78    Wt Readings from Last 3 Encounters:  04/13/15 160 lb (72.576 kg)  03/28/15 160 lb 12 oz (72.916 kg)  01/04/15 156 lb 8 oz (70.988 kg)    General appearance: alert, cooperative and appears stated age Ears: normal TM's and external ear canals both ears Throat: lips, mucosa, and tongue normal; teeth and gums normal Neck: no adenopathy, no carotid bruit, supple, symmetrical, trachea midline and  thyroid not enlarged, symmetric, no tenderness/mass/nodules Back: symmetric, no curvature. ROM normal. No CVA tenderness. Lungs: clear to auscultation bilaterally Heart: regular rate and rhythm, S1, S2 normal, no murmur, click, rub or gallop Abdomen: soft, non-tender; bowel sounds normal; no masses,  no organomegaly Pulses: 2+ and symmetric Skin: Skin color, texture, turgor normal. No rashes or lesions Lymph nodes: Cervical, supraclavicular, and axillary nodes normal.  Lab Results  Component Value Date   HGBA1C 5.4 11/25/2013    Lab Results  Component Value Date   CREATININE 0.9 03/21/2015   CREATININE 0.9 01/12/2015   CREATININE 0.95 09/01/2014    Lab Results  Component Value Date   WBC 8.4 03/21/2015   HGB 9.9* 03/21/2015   HCT 30* 03/21/2015   PLT 322 03/21/2015   GLUCOSE 86 08/15/2012   CHOL 136 03/21/2015   TRIG 170* 03/21/2015   HDL 29* 03/21/2015   LDLDIRECT 104* 08/15/2012   LDLCALC 73 03/21/2015   ALT 33 03/21/2015   AST 28 03/21/2015   NA 137 03/21/2015   K 4.6 03/21/2015   CL 106 08/15/2012   CREATININE 0.9 03/21/2015   BUN 11 03/21/2015   CO2 27 08/15/2012   TSH 1.62 03/21/2015   INR 1.0 09/01/2014   HGBA1C 5.4 11/25/2013    US Soft Tissue Head/neck  01/07/2015  CLINICAL DATA:  59 year old female with a history of hyperthyroidism, lymphadenopathy EXAM: THYROID ULTRASOUND TECHNIQUE: Ultrasound examination of the thyroid gland and adjacent soft tissues was performed. COMPARISON:  None. FINDINGS: Right thyroid lobe Measurements: 5.3 cm x 1.5 cm x 1.3 cm.  No nodules visualized. Left thyroid lobe Measurements: 4.6 cm x 1.2 cm x 1.2 cm. Small left-sided nodules no greater than 3 mm. Isthmus Thickness: 3 mm.  No nodules visualized. Lymphadenopathy None visualized. IMPRESSION: Relatively unremarkable thyroid survey with small left-sided nodules not meeting criteria for biopsy. Findings do not meet current SRU consensus criteria for biopsy. Follow-up by clinical  exam is recommended. If patient has known risk factors for thyroid carcinoma, consider follow-up ultrasound in 12 months. If patient is clinically hyperthyroid, consider nuclear medicine thyroid uptake and scan.Reference: Management of Thyroid Nodules Detected at [place]: Society of Radiologists in Ultrasound Consensus Conference Statement. Radiology 2005; [id number]. Signed, Yvone Neu. Loreta Ave, DO [facility] Radiology Electronically Signed   By: Gilmer Mor D.O.   On: 01/07/2015 17:36    Assessment & Plan:   Problem List Items Addressed This Visit    Hyperthyroidism    Postinfectious,  Now resolved.thyroid antibody panels were negative.     Lab Results  Component Value Date   TSH 1.62 03/21/2015         Fatigue    Etiology likely multifactorial.    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 , encouraged regular exercise 5 days /week,  consider sleep study and cardiology evaluation if  Snoring noted or exertional dyspnea reported      Dyslipidemia (high LDL; low HDL)    Recent fasting lipids reviewed.  Mediterranean diet and regular exercise recommended.        Other Visit Diagnoses    Other secondary osteoarthritis of both shoulders    -  Primary    Relevant Medications    HYDROcodone-acetaminophen (NORCO/VICODIN) 5-325 MG tablet    HYDROcodone-acetaminophen (NORCO/VICODIN) 5-325 MG tablet       I have changed Ms. Birdsell's HYDROcodone-acetaminophen and HYDROcodone-acetaminophen. I am also having her maintain her Cholecalciferol (VITAMIN D PO), Calcium Carbonate-Vit D-Min (CALCIUM 1200 PO), CINNAMON PO, vitamin C, Fish Oil, multivitamin, progesterone, Docusate Calcium (STOOL SOFTENER PO), EPINEPHrine, Cyanocobalamin (VITAMIN B 12 PO), hydrOXYzine, clobetasol ointment, buPROPion, Diclofenac Sodium, nitroGLYCERIN, and  metoprolol tartrate.  Meds ordered this encounter  Medications  . DISCONTD: HYDROcodone-acetaminophen (NORCO/VICODIN) 5-325 MG tablet    Sig: Take 1 tablet by mouth 2 (two) times daily as needed. May refill on or after April 28, 2015    Dispense:  60 tablet    Refill:  0  . HYDROcodone-acetaminophen (NORCO/VICODIN) 5-325 MG tablet    Sig: Take 1 tablet by mouth 2 (two) times daily as needed. May refill on or after May 29 2015    Dispense:  60 tablet    Refill:  0  . HYDROcodone-acetaminophen (NORCO/VICODIN) 5-325 MG tablet    Sig: Take 1 tablet by mouth at bedtime as needed and may repeat dose one time if needed. May refill on or after February 1 , 2107    Dispense:  60 tablet    Refill:  0    Medications Discontinued During This Encounter  Medication Reason  . HYDROcodone-acetaminophen (NORCO/VICODIN) 5-325 MG tablet Reorder  . HYDROcodone-acetaminophen (NORCO/VICODIN) 5-325 MG tablet Reorder  . HYDROcodone-acetaminophen (NORCO/VICODIN) 5-325 MG tablet Reorder    Follow-up: No Follow-up on file.   Sherlene Shams, MD

## 2015-04-13 NOTE — Patient Instructions (Signed)
We will repeat your Chest CT In January  Read about the Mediterranean diet to help improve your HDL   Exercise will also improve your HDL

## 2015-04-16 ENCOUNTER — Encounter: Payer: Self-pay | Admitting: Internal Medicine

## 2015-04-16 DIAGNOSIS — E785 Hyperlipidemia, unspecified: Secondary | ICD-10-CM | POA: Insufficient documentation

## 2015-04-16 DIAGNOSIS — R5383 Other fatigue: Secondary | ICD-10-CM | POA: Insufficient documentation

## 2015-04-16 NOTE — Assessment & Plan Note (Signed)
Etiology likely multifactorial.    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 , encouraged regular exercise 5 days /week,  consider sleep study and cardiology evaluation if  Snoring noted or exertional dyspnea reported

## 2015-04-16 NOTE — Assessment & Plan Note (Addendum)
Postinfectious,  Now resolved.thyroid antibody panels were negative.     Lab Results  Component Value Date   TSH 1.62 03/21/2015

## 2015-04-16 NOTE — Assessment & Plan Note (Signed)
Recent fasting lipids reviewed.  Mediterranean diet and regular exercise recommended.

## 2015-05-18 ENCOUNTER — Telehealth: Payer: Self-pay | Admitting: Internal Medicine

## 2015-05-18 NOTE — Telephone Encounter (Signed)
Scheduled appointment with NP for tomorrow.

## 2015-05-18 NOTE — Telephone Encounter (Signed)
Pt called about hurting all over and sick on her stomach and she feels feverish. Pt  Does not want to see another provider. No appt avail to sch. Let me know where to sch. Thank You!

## 2015-05-19 ENCOUNTER — Ambulatory Visit (INDEPENDENT_AMBULATORY_CARE_PROVIDER_SITE_OTHER): Payer: 59 | Admitting: Nurse Practitioner

## 2015-05-19 ENCOUNTER — Encounter: Payer: Self-pay | Admitting: Nurse Practitioner

## 2015-05-19 VITALS — BP 124/68 | HR 90 | Temp 98.1°F | Resp 14 | Ht 63.0 in | Wt 161.8 lb

## 2015-05-19 DIAGNOSIS — M2550 Pain in unspecified joint: Secondary | ICD-10-CM | POA: Insufficient documentation

## 2015-05-19 DIAGNOSIS — M791 Myalgia, unspecified site: Secondary | ICD-10-CM

## 2015-05-19 LAB — BASIC METABOLIC PANEL
BUN: 13 mg/dL (ref 6–23)
CO2: 26 mEq/L (ref 19–32)
Calcium: 9.3 mg/dL (ref 8.4–10.5)
Chloride: 105 mEq/L (ref 96–112)
Creatinine, Ser: 0.84 mg/dL (ref 0.40–1.20)
GFR: 73.54 mL/min (ref >60.00)
Glucose, Bld: 79 mg/dL (ref 70–99)
Potassium: 3.8 mEq/L (ref 3.5–5.1)
Sodium: 137 mEq/L (ref 135–145)

## 2015-05-19 LAB — SEDIMENTATION RATE: Sed Rate: 16 mm/hr (ref 0–22)

## 2015-05-19 LAB — C-REACTIVE PROTEIN: CRP: 0.6 mg/dL (ref 0.5–20.0)

## 2015-05-19 MED ORDER — PREDNISONE 10 MG PO TABS: ORAL_TABLET | ORAL | Status: DC

## 2015-05-19 NOTE — Patient Instructions (Signed)
We will contact you about your labs.   Prednisone with breakfast or lunch at the latest.  6 tablets on day 1, 5 tablets on day 2, 4 tablets on day 3, 3 tablets on day 4, 2 tablets day 5, 1 tablet on day 6...done! Take tablets all together not spaced out Don't take with NSAIDs (Ibuprofen, Aleve, Naproxen, Meloxicam ect...).

## 2015-05-19 NOTE — Assessment & Plan Note (Addendum)
Patient has chronic lower back pain and this is not a new problem, but patient reports she can no longer deal with the aching pains. Patient has a unconvincing physical exam. Will obtain ANA, CRP, rheumatoid factor, and ESR. BMET also obtained due to cramping of muscles at various locations. FU after results. Prednisone taper given with instructions verbal and AVS.

## 2015-05-19 NOTE — Progress Notes (Signed)
Pre visit review using our clinic review tool, if applicable. No additional management support is needed unless otherwise documented below in the visit note. 

## 2015-05-19 NOTE — Progress Notes (Signed)
Patient ID: Cheryl [name], female    DOB: [birth year]  Age: 59 y.o. MRN: [id number]  CC: Generalized Body Aches   HPI Cheryl [name] presents for CC of generalized body aches x a few months.  1) Onset- Since fall when the weather started to become cool Location- Hips, knees, ankles, neck (worst) Duration- constant  Characteristics- stabbing sensation  Aggravating factors- sitting at desk Relieving factors- taking medications help somewhat  Severity- 6-10/10    Treatment to date: Injections- in shoulders Vicodin- takes edge off  Arthritis- excedrin  Heating pads Voltaren   Ergonomic support at work  Drinking water 20 oz multiple times a day   History Cheryl [name] has a past medical history of Arthritis; Blood in stool; Chicken pox; Generalized headaches; History of blood transfusion; Thyroid disease; UTI (lower urinary tract infection); Arrhythmia; and LBBB (left bundle branch block).   Cheryl [name] has past surgical history that includes Breast surgery (1976); Appendectomy (1981); Tonsillectomy and adenoidectomy (1964); cystic fibrosis tumor removal (1983); Thumb amputation (1992); Cardiac catheterization (2004); Cardiac catheterization (2006); Abdominal hysterectomy (1997); and Breast biopsy (Bilateral).   Cheryl [name] family history includes Arthritis in Cheryl [name] father and mother; Breast cancer (age of onset: 29) in Cheryl [name] mother; Cancer in Cheryl [name] father and mother; Diabetes in Cheryl [name] brother, father, maternal aunt, maternal grandmother, paternal grandfather, paternal grandmother, and sister; Heart attack in Cheryl [name] mother; Heart disease in Cheryl [name] father, maternal grandfather, maternal grandmother, mother, paternal grandfather, paternal grandmother, and sister; Heart disease (age of onset: 63) in Cheryl [name] brother; Hyperlipidemia in Cheryl [name] mother; Hypertension in Cheryl [name] father and mother; Liver disease in Cheryl [name] brother; Lung disease in Cheryl [name] brother; Stroke in Cheryl [name] paternal grandfather.Cheryl [name] reports that Cheryl [name] quit smoking about 2 years  ago. Cheryl [name] smoking use included Cigarettes. Cheryl [name] has a 8.5 pack-year smoking history. Cheryl [name] has never used smokeless tobacco. Cheryl [name] reports that Cheryl [name] drinks alcohol. Cheryl [name] reports that Cheryl [name] does not use illicit drugs.  Outpatient Prescriptions Prior to Visit  Medication Sig Dispense Refill  . buPROPion (WELLBUTRIN SR) 100 MG 12 hr tablet Take 1 tablet by mouth two  times daily 180 tablet 0  . Calcium Carbonate-Vit D-Min (CALCIUM 1200 PO) Take 1 tablet by mouth daily.    . Cholecalciferol (VITAMIN D PO) Take by mouth. Pt takes 5000iu daily    . CINNAMON PO Take 2,000 mg by mouth daily.    . clobetasol ointment (TEMOVATE) 2.63 % Apply 1 application topically 2 (two) times daily. Until resolved (Patient taking differently: Apply 1 application topically daily. Until resolved) 30 g 2  . Cyanocobalamin (VITAMIN B 12 PO) Take by mouth.    . Diclofenac Sodium 1.5 % SOLN Apply to painful joint twice daily 150 mL 3  . Docusate Calcium (STOOL SOFTENER PO) Take by mouth.    . EPINEPHrine (EPIPEN) 0.3 mg/0.3 mL DEVI Inject 0.3 mLs (0.3 mg total) into the muscle once. (Patient taking differently: Inject 0.3 mg into the muscle as needed. ) 1 Device 5  . HYDROcodone-acetaminophen (NORCO/VICODIN) 5-325 MG tablet Take 1 tablet by mouth 2 (two) times daily as needed. May refill on or after May 29 2015 60 tablet 0  . HYDROcodone-acetaminophen (NORCO/VICODIN) 5-325 MG tablet Take 1 tablet by mouth at bedtime as needed and may repeat dose one time if needed. May refill on or after February 1 , 2107 60 tablet 0  . hydrOXYzine (ATARAX/VISTARIL) 25 MG tablet TAKE 1 TABLET (25 MG TOTAL) BY MOUTH 3 (THREE) TIMES DAILY AS NEEDED FOR ITCHING. 90 tablet 3  .  metoprolol tartrate (LOPRESSOR) 25 MG tablet Take 1 tablet (25 mg total) by mouth 2 (two) times daily as needed. 180 tablet 3  . Multiple Vitamin (MULTIVITAMIN) tablet Take 1 tablet by mouth daily.    . nitroGLYCERIN (NITROSTAT) 0.4 MG SL tablet Place 1 tablet (0.4 mg total) under  the tongue every 5 (five) minutes as needed for chest pain. 25 tablet 3  . Omega-3 Fatty Acids (FISH OIL) 1000 MG CAPS Take 1 capsule by mouth daily.    . progesterone (PROMETRIUM) 200 MG capsule Take 200 mg by mouth daily.    . vitamin C (ASCORBIC ACID) 500 MG tablet Take 500 mg by mouth daily.     No facility-administered medications prior to visit.    ROS Review of Systems  Constitutional: Negative for fever, chills, diaphoresis and fatigue.  Respiratory: Negative for chest tightness, shortness of breath and wheezing.   Cardiovascular: Negative for chest pain, palpitations and leg swelling.  Gastrointestinal: Negative for nausea, vomiting and diarrhea.  Musculoskeletal: Positive for myalgias, back pain, arthralgias and neck pain. Negative for joint swelling, gait problem and neck stiffness.  Skin: Negative for rash.  Neurological: Negative for dizziness, weakness, numbness and headaches.  Psychiatric/Behavioral: The patient is not nervous/anxious.    Objective:  BP 124/68 mmHg  Pulse 90  Temp(Src) 98.1 F (36.7 C) (Oral)  Resp 14  Ht _0  (1.6 m)  Wt 161 lb 12.8 oz (73.392 kg)  BMI 28.67 kg/m2  SpO2 97%  LMP   Physical Exam  Constitutional: Cheryl [name] is oriented to person, place, and time. Cheryl [name] appears well-developed and well-nourished. No distress.  HENT:  Head: Normocephalic and atraumatic.  Right Ear: External ear normal.  Left Ear: External ear normal.  Cardiovascular: Normal rate and regular rhythm.   Pulmonary/Chest: Effort normal and breath sounds normal. No respiratory distress. Cheryl [name] has no wheezes. Cheryl [name] has no rales. Cheryl [name] exhibits no tenderness.  Musculoskeletal: Normal range of motion. Cheryl [name] exhibits tenderness. Cheryl [name] exhibits no edema.  Normal hip flexion, extension, abduction, adduction. External rotation non-tender, internal rotation was tender laterally Cheryl [name] reports  Neurological: Cheryl [name] is alert and oriented to person, place, and time. No cranial nerve deficit. Cheryl [name] exhibits  normal muscle tone. Coordination normal.  Deltoid 5/5 Bilateral, Biceps 5/5 bilateral, Wrist extensors 5/5 bilateral, Triceps 5/5 bilateral, finger flexors and abductors 5/5 bilateral, grip 5/5 bilaterally, no Hoffman's,   intact heel/toe/sequential walking, sensation intact upper and lower extremities. Straight leg raise negative bilaterally. DTR's upper and lower 2+   Skin: Skin is warm and dry. No rash noted. Cheryl [name] is not diaphoretic.  Psychiatric: Cheryl [name] has a normal mood and affect. Cheryl [name] behavior is normal. Judgment and thought content normal.   Assessment & Plan:   Skylee was seen today for generalized body aches.  Diagnoses and all orders for this visit:  Myalgia -     Sed Rate (ESR) -     C-reactive protein -     Antinuclear Antib (ANA) -     Rheumatoid Factor -     Basic Metabolic Panel (BMET)  Arthralgia -     Sed Rate (ESR) -     C-reactive protein -     Antinuclear Antib (ANA) -     Rheumatoid Factor -     Basic Metabolic Panel (BMET)  Other orders -     predniSONE (DELTASONE) 10 MG tablet; Take 6 tablets by mouth on day 1 with breakfast then decrease by 1 tablet each day until gone.   I  am having Cheryl [name] start on predniSONE. I am also having Cheryl [name] maintain Cheryl [name] Cholecalciferol (VITAMIN D PO), Calcium Carbonate-Vit D-Min (CALCIUM 1200 PO), CINNAMON PO, vitamin C, Fish Oil, multivitamin, progesterone, Docusate Calcium (STOOL SOFTENER PO), EPINEPHrine, Cyanocobalamin (VITAMIN B 12 PO), hydrOXYzine, clobetasol ointment, buPROPion, Diclofenac Sodium, nitroGLYCERIN, metoprolol tartrate, HYDROcodone-acetaminophen, and HYDROcodone-acetaminophen.  Meds ordered this encounter  Medications  . predniSONE (DELTASONE) 10 MG tablet    Sig: Take 6 tablets by mouth on day 1 with breakfast then decrease by 1 tablet each day until gone.    Dispense:  21 tablet    Refill:  0    Order Specific Question:  Supervising Provider    Answer:  Crecencio Mc [[id number]]     Follow-up: Return if  symptoms worsen or fail to improve.

## 2015-05-20 LAB — RHEUMATOID FACTOR: Rhuematoid fact SerPl-aCnc: <10 IU/mL (ref <=14)

## 2015-05-20 LAB — ANTI-NUCLEAR AB-TITER (ANA TITER): ANA Titer 1: 1:40 {titer} — ABNORMAL HIGH

## 2015-05-20 LAB — ANA: Anti Nuclear Antibody(ANA): POSITIVE — AB

## 2015-05-24 ENCOUNTER — Other Ambulatory Visit: Payer: Self-pay | Admitting: Internal Medicine

## 2015-05-24 NOTE — Telephone Encounter (Signed)
Refilled

## 2015-05-26 ENCOUNTER — Other Ambulatory Visit: Payer: Self-pay | Admitting: Nurse Practitioner

## 2015-05-26 DIAGNOSIS — R918 Other nonspecific abnormal finding of lung field: Secondary | ICD-10-CM

## 2015-05-29 DIAGNOSIS — I728 Aneurysm of other specified arteries: Secondary | ICD-10-CM

## 2015-05-29 HISTORY — DX: Aneurysm of other specified arteries: I72.8

## 2015-06-01 ENCOUNTER — Ambulatory Visit
Admission: RE | Admit: 2015-06-01 | Discharge: 2015-06-01 | Disposition: A | Discharge status: Home / Self Care | Payer: 59 | Source: Ambulatory Visit | Attending: Nurse Practitioner | Admitting: Nurse Practitioner

## 2015-06-01 ENCOUNTER — Ambulatory Visit: Admission: RE | Admit: 2015-06-01 | Payer: 59 | Source: Ambulatory Visit

## 2015-06-01 DIAGNOSIS — R918 Other nonspecific abnormal finding of lung field: Secondary | ICD-10-CM | POA: Insufficient documentation

## 2015-06-02 ENCOUNTER — Encounter: Payer: Self-pay | Admitting: Nurse Practitioner

## 2015-06-03 ENCOUNTER — Other Ambulatory Visit: Payer: Self-pay | Admitting: Nurse Practitioner

## 2015-06-03 DIAGNOSIS — R768 Other specified abnormal immunological findings in serum: Secondary | ICD-10-CM

## 2015-06-08 ENCOUNTER — Telehealth: Payer: Self-pay

## 2015-06-08 MED ORDER — PROPRANOLOL HCL 10 MG PO TABS: 10.0000 mg | ORAL_TABLET | Freq: Three times a day (TID) | ORAL | Status: DC

## 2015-06-08 NOTE — Telephone Encounter (Signed)
Pt states she would like her medication changed back to Propanolol. Please call.

## 2015-06-08 NOTE — Telephone Encounter (Signed)
Spoke w/ pt.  She reports that her heart has been skipping since Christmas and does not feel that metoprolol worked as well as propranolol at controlling her PVCs.  She reports that metoprolol makes her too sleepy to function on a daily basis.   Her PVCs have increased and she has not taken anything due to the side effects of the metoprolol. She states that she is not consuming more caffeine that usual, is not under more stress, but is in constant pain due to arthritis (she is being treated for this). She would like to go back to propranolol 10 mg TID, as that seemed to work for her.  She only has 2 pills left and requests #15 be sent to [facility] and 90 day supply to mail order pharmacy.   Advised her that I will send in propranolol if Dr. Mariah MillingGollan is agreeable or call her back if he has any further recommendations.

## 2015-06-08 NOTE — Telephone Encounter (Signed)
Spoke w/ pt.  Advised her that Dr. Mariah MillingGollan offered propranolol 60 mg once daily, but her BP is too low to support this.  Sending propranolol 10 mg TID to her pharmacies.  Asked her to call back if her sx do not improve and we can get her in for an EKG.  She is appreciative of the call.

## 2015-06-21 DIAGNOSIS — R768 Other specified abnormal immunological findings in serum: Secondary | ICD-10-CM | POA: Insufficient documentation

## 2015-06-21 DIAGNOSIS — G8929 Other chronic pain: Secondary | ICD-10-CM | POA: Insufficient documentation

## 2015-06-21 DIAGNOSIS — M542 Cervicalgia: Secondary | ICD-10-CM

## 2015-07-12 ENCOUNTER — Other Ambulatory Visit: Payer: Self-pay | Admitting: Internal Medicine

## 2015-07-12 NOTE — Telephone Encounter (Signed)
Refilled in December. Please advise? 

## 2015-07-12 NOTE — Telephone Encounter (Signed)
90 day supply authorized and sent   

## 2015-07-15 ENCOUNTER — Ambulatory Visit: Payer: 59 | Admitting: Internal Medicine

## 2015-07-21 ENCOUNTER — Ambulatory Visit
Admission: RE | Admit: 2015-07-21 | Discharge: 2015-07-21 | Disposition: A | Discharge status: Home / Self Care | Payer: 59 | Source: Ambulatory Visit | Attending: Internal Medicine | Admitting: Internal Medicine

## 2015-07-21 ENCOUNTER — Ambulatory Visit (INDEPENDENT_AMBULATORY_CARE_PROVIDER_SITE_OTHER): Payer: 59 | Admitting: Internal Medicine

## 2015-07-21 ENCOUNTER — Encounter: Payer: Self-pay | Admitting: Internal Medicine

## 2015-07-21 VITALS — BP 138/76 | HR 90 | Temp 98.1°F | Resp 16 | Ht 63.0 in | Wt 163.4 lb

## 2015-07-21 DIAGNOSIS — M2550 Pain in unspecified joint: Secondary | ICD-10-CM

## 2015-07-21 DIAGNOSIS — R140 Abdominal distension (gaseous): Secondary | ICD-10-CM

## 2015-07-21 DIAGNOSIS — M479 Spondylosis, unspecified: Secondary | ICD-10-CM | POA: Insufficient documentation

## 2015-07-21 DIAGNOSIS — M19211 Secondary osteoarthritis, right shoulder: Secondary | ICD-10-CM

## 2015-07-21 DIAGNOSIS — R1031 Right lower quadrant pain: Secondary | ICD-10-CM | POA: Diagnosis not present

## 2015-07-21 DIAGNOSIS — G8929 Other chronic pain: Secondary | ICD-10-CM

## 2015-07-21 DIAGNOSIS — M545 Low back pain: Secondary | ICD-10-CM

## 2015-07-21 DIAGNOSIS — M5440 Lumbago with sciatica, unspecified side: Secondary | ICD-10-CM

## 2015-07-21 DIAGNOSIS — M19212 Secondary osteoarthritis, left shoulder: Secondary | ICD-10-CM

## 2015-07-21 DIAGNOSIS — R150 Incomplete defecation: Secondary | ICD-10-CM | POA: Diagnosis not present

## 2015-07-21 DIAGNOSIS — R109 Unspecified abdominal pain: Secondary | ICD-10-CM

## 2015-07-21 MED ORDER — HYDROCODONE-ACETAMINOPHEN 5-325 MG PO TABS: 1.0000 | ORAL_TABLET | Freq: Every evening | ORAL | Status: DC | PRN

## 2015-07-21 NOTE — Patient Instructions (Signed)
I have ordered plain films of the lower spine and of  Your abdomen   If the abdominal films suggest constipation we will treat and reevaluate your abd pain after resolution of your constipation   If your lumbar spine films are normal,  I would like you to gradually reduce your use of vicodin  and rely more on physical conditioning/massage,   Muscle relaxers, and anti inflammatories to manage your chronic pain

## 2015-07-21 NOTE — Progress Notes (Signed)
Subjective:  Patient ID: Cheryl [name], female    DOB: 29-Jun-1955  Age: 60 y.o. MRN: [id number]  CC: The primary encounter diagnosis was Midline low back pain with sciatica, sciatica laterality unspecified. Diagnoses of Chronic RLQ pain, Bloating, Other secondary osteoarthritis of both shoulders, Chronic lower back pain, Right-sided abdominal pain of unknown cause, and Arthralgia were also pertinent to this visit.  HPI [name] presents for follow up on chronic low back pain  and refill of medications, specifically vicodin which she is using twice daily .  She has had no prior evaluation with MRI or even lumbar spine films,   Seen in December by [name] for myalgias and arthralgias,  And given a prednisone  taper . Inflammatory markers were normal but ANA was  titer was indeterminate at 1:40 and a positive speckled and she was referred to [facility].  Exam was completely normal and she was referred to Pt and encourage to use excedrin  And  flexeril .  New cc of constant pain in the RUQ that wraps around to the back.  At times sharp , knifelike has been occurring since December which is episodic   Bowels moving daily.  Uses a stool softener daily ,  Using 2 3  vicodin daily for neck pain on the left side.  Aggravated by  Workplace positions.  Sits at a compute all day.  Getting PT.  Using a special pillow which has helped with her sleeping.   [name]"Feels like an elephant is sitting on her chest ,  From xiphoid to sternum."  Pain is constant,  Not aggravated by exertion.  24/7 for the last couple of months . Eating makes it worse. ,  Feels bloated all the time .  Patient is concerned about her GB. GB was normal appearing by April 2016 CT of abd and pelvis. Pulmonary nodule was noted at follow up CT advised at 6 to 12 months depending on risk for CA.  She quit smoking in 2014,  8.5 pack year history .  CT was ordered  in January and cancelled by patient ,   Says she was told by Dr  [name] to [name] Vitamin K !!     Metoprolol  switched to inderal using prn palpitations none recently      Outpatient Prescriptions Prior to Visit  Medication Sig Dispense Refill  . buPROPion (WELLBUTRIN SR) 100 MG 12 hr tablet Take 1 tablet by mouth two  times daily 180 tablet 2  . Calcium Carbonate-Vit D-Min (CALCIUM 1200 PO) Take 1 tablet by mouth daily.    . Cholecalciferol (VITAMIN D PO) Take by mouth. Pt takes 5000iu daily    . CINNAMON PO Take 2,000 mg by mouth daily.    . clobetasol ointment (TEMOVATE) 0.05 % Apply 1 application topically 2 (two) times daily. Until resolved (Patient taking differently: Apply 1 application topically daily. Until resolved) 30 g 2  . Cyanocobalamin (VITAMIN B 12 PO) Take by mouth.    . Diclofenac Sodium 1.5 % SOLN Apply to painful joint twice daily 150 mL 3  . Docusate Calcium (STOOL SOFTENER PO) Take by mouth.    . EPINEPHrine (EPIPEN) 0.3 mg/0.3 mL DEVI Inject 0.3 mLs (0.3 mg total) into the muscle once. (Patient taking differently: Inject 0.3 mg into the muscle as needed. ) 1 Device 5  . HYDROcodone-acetaminophen (NORCO/VICODIN) 5-325 MG tablet Take 1 tablet by mouth 2 (two) times daily as needed. May refill on or after May 29 2015 60 tablet 0  . hydrOXYzine (ATARAX/VISTARIL) 25 MG tablet TAKE 1 TABLET (25 MG TOTAL) BY MOUTH 3 (THREE) TIMES DAILY AS NEEDED FOR ITCHING. 90 tablet 3  . Multiple Vitamin (MULTIVITAMIN) tablet Take 1 tablet by mouth daily.    . nitroGLYCERIN (NITROSTAT) 0.4 MG SL tablet Place 1 tablet (0.4 mg total) under the tongue every 5 (five) minutes as needed for chest pain. 25 tablet 3  . Omega-3 Fatty Acids (FISH OIL) 1000 MG CAPS Take 1 capsule by mouth daily.    . predniSONE (DELTASONE) 10 MG tablet Take 6 tablets by mouth on day 1 with breakfast then decrease by 1 tablet each day until gone. 21 tablet 0  . progesterone (PROMETRIUM) 200 MG capsule Take 200 mg by mouth daily.    . propranolol (INDERAL) 10 MG tablet Take 1 tablet  (10 mg total) by mouth 3 (three) times daily. 21 tablet 6  . propranolol (INDERAL) 10 MG tablet Take 1 tablet (10 mg total) by mouth 3 (three) times daily. 270 tablet 3  . vitamin C (ASCORBIC ACID) 500 MG tablet Take 500 mg by mouth daily.    [name] HYDROcodone-acetaminophen (NORCO/VICODIN) 5-325 MG tablet Take 1 tablet by mouth at bedtime as needed and may repeat dose one time if needed. May refill on or after February 1 , 2107 60 tablet 0   No facility-administered medications prior to visit.    Review of Systems;  Patient denies headache, fevers, malaise, unintentional weight loss, skin rash, eye pain, sinus congestion and sinus pain, sore throat, dysphagia,  hemoptysis , cough, dyspnea, wheezing, chest pain, palpitations, orthopnea, edema, abdominal pain, nausea, melena, diarrhea, constipation, flank pain, dysuria, hematuria, urinary  Frequency, nocturia, numbness, tingling, seizures,  Focal weakness, Loss of consciousness,  Tremor, insomnia, depression, anxiety, and suicidal ideation.      Objective:  BP 138/76 mmHg  Pulse 90  Temp(Src) 98.1 F (36.7 C) (Oral)  Resp 16  Ht  (1.6 m)  Wt 163 lb 6.4 oz (74.118 kg)  BMI 28.95 kg/m2  SpO2 97%  BP Readings from Last 3 Encounters:  07/21/15 138/76  05/19/15 124/68  04/13/15 122/64    Wt Readings from Last 3 Encounters:  07/21/15 163 lb 6.4 oz (74.118 kg)  05/19/15 161 lb 12.8 oz (73.392 kg)  04/13/15 160 lb (72.576 kg)    General appearance: alert, cooperative and appears stated age Ears: normal TM's and external ear canals both ears Throat: lips, mucosa, and tongue normal; teeth and gums normal Neck: no adenopathy, no carotid bruit, supple, symmetrical, trachea midline and thyroid not enlarged, symmetric, no tenderness/mass/nodules Back: symmetric, no curvature. ROM normal. No CVA tenderness. Lungs: clear to auscultation bilaterally Heart: regular rate and rhythm, S1, S2 normal, no murmur, click, rub or gallop Abdomen:  soft, non-tender; bowel sounds normal; no masses,  no organomegaly Pulses: 2+ and symmetric Skin: Skin color, texture, turgor normal. No rashes or lesions Lymph nodes: Cervical, supraclavicular, and axillary nodes normal.  Lab Results  Component Value Date   HGBA1C 5.4 11/25/2013    Lab Results  Component Value Date   CREATININE 0.84 05/19/2015   CREATININE 0.9 03/21/2015   CREATININE 0.9 01/12/2015    Lab Results  Component Value Date   WBC 8.4 03/21/2015   HGB 9.9* 03/21/2015   HCT 30* 03/21/2015   PLT 322 03/21/2015   GLUCOSE 79 05/19/2015   CHOL 136 03/21/2015   TRIG 170* 03/21/2015   HDL 29* 03/21/2015   LDLDIRECT 104* 08/15/2012  LDLCALC 73 03/21/2015   ALT 33 03/21/2015   AST 28 03/21/2015   NA 137 05/19/2015   K 3.8 05/19/2015   CL 105 05/19/2015   CREATININE 0.84 05/19/2015   BUN 13 05/19/2015   CO2 26 05/19/2015   TSH 1.62 03/21/2015   INR 1.0 09/01/2014   HGBA1C 5.4 11/25/2013    Ct Chest Wo Contrast  06/01/2015  CLINICAL DATA:  Follow-up pulmonary nodule EXAM: CT CHEST WITHOUT CONTRAST TECHNIQUE: Multidetector CT imaging of the chest was performed following the standard protocol without IV contrast. COMPARISON:  CT scan September 17, 2014 FINDINGS: The 1 or 2 mm nodule in the right upper lobe on axial image 15 of series 3 is stable. The 4.8 mm nodule in the left upper lobe on series 3, image 12 is also stable given difference in slice selection. No new pulmonary nodules. No masses or infiltrates. The central airways are normal. No pneumothorax. A nodule laterally in the right chest wall was previously described as a lymph node versus a breast mass. This appears to have a central fatty hilum today and is almost certainly a lymph node. A small nodule in the medial right breast on series 2, image 22 is stable. The chest wall is otherwise unremarkable. The 11 mm lymph node in the left axilla described on the previous study demonstrates a fatty hilum and is unremarkable  and unchanged today. No suspicious adenopathy is identified. There are coronary artery calcifications. The heart size normal. No effusions. The central great vessels are normal in appearance. Limited unenhanced views of the upper abdomen are stable. The known small cyst in the pancreatic tail is not as well seen today due the lack of contrast. Visualized bones are unchanged unremarkable. IMPRESSION: 1. Stable small pulmonary nodules as described above. See below for Fleischner Society guideline follow-up recommendations. 2. The known small cyst in the pancreatic tail is not well visualized today due to lack of contrast. It is not grossly changed however. Given risk factors for bronchogenic carcinoma, follow-up chest CT at 1 year is recommended. This recommendation follows the consensus statement: Guidelines for Management of Small Pulmonary Nodules Detected on CT Scans: A Statement from the Fleischner Society as published in Radiology 2005; 237:395-400. Electronically Signed   By: Gerome Sam III M.D   On: 06/01/2015 17:34    Assessment & Plan:   Problem List Items Addressed This Visit    Chronic lower back pain    Plain films done today show mild spurring without disk space loss and without alignment issues.  Weill begin to taper vicodin use.       Relevant Medications   HYDROcodone-acetaminophen (NORCO/VICODIN) 5-325 MG tablet   Arthralgia    Rheumatologic evaluation reviewed.  No signs of inflammatory arthritis      Right-sided abdominal pain of unknown cause    Will treat constipation and if pain persists, ..  Ultrasound. To r/o gallbladder disease.        Other Visit Diagnoses    Midline low back pain with sciatica, sciatica laterality unspecified    -  Primary    Relevant Medications    HYDROcodone-acetaminophen (NORCO/VICODIN) 5-325 MG tablet    Other Relevant Orders    DG Lumbar Spine Complete (Completed)    Chronic RLQ pain        Bloating        Relevant Orders    DG Abd 2  Views (Completed)    Other secondary osteoarthritis of both shoulders  Relevant Medications    HYDROcodone-acetaminophen (NORCO/VICODIN) 5-325 MG tablet      A total of of face to face time was spent with patient more than half of which was spent in counselling and coordination of care    I have changed Ms. Pinzon's HYDROcodone-acetaminophen. I am also having her maintain her Cholecalciferol (VITAMIN D PO), Calcium Carbonate-Vit D-Min (CALCIUM 1200 PO), CINNAMON PO, vitamin C, Fish Oil, multivitamin, progesterone, Docusate Calcium (STOOL SOFTENER PO), EPINEPHrine, Cyanocobalamin (VITAMIN B 12 PO), hydrOXYzine, clobetasol ointment, Diclofenac Sodium, nitroGLYCERIN, HYDROcodone-acetaminophen, predniSONE, propranolol, propranolol, buPROPion, and Vitamin K (Phytonadione).  Meds ordered this encounter  Medications  . Vitamin K, Phytonadione, 100 MCG TABS    Sig: Take by mouth.  [name] HYDROcodone-acetaminophen (NORCO/VICODIN) 5-325 MG tablet    Sig: Take 1 tablet by mouth at bedtime as needed and may repeat dose one time if needed. May refill on or after March 1 , 2107    Dispense:  60 tablet    Refill:  0    Medications Discontinued During This Encounter  Medication Reason  . HYDROcodone-acetaminophen (NORCO/VICODIN) 5-325 MG tablet Reorder    Follow-up: Return in about 4 weeks (around 08/18/2015).   Sherlene Shams, MD

## 2015-07-22 ENCOUNTER — Other Ambulatory Visit: Payer: Self-pay | Admitting: Internal Medicine

## 2015-07-22 ENCOUNTER — Encounter: Payer: Self-pay | Admitting: Internal Medicine

## 2015-07-22 MED ORDER — LACTULOSE 20 GM/30ML PO SOLN: ORAL | Status: DC

## 2015-07-23 DIAGNOSIS — R109 Unspecified abdominal pain: Secondary | ICD-10-CM | POA: Insufficient documentation

## 2015-07-23 NOTE — Assessment & Plan Note (Signed)
Plain films done today show mild spurring without disk space loss and without alignment issues.  Weill begin to taper vicodin use.

## 2015-07-23 NOTE — Assessment & Plan Note (Signed)
Will treat constipation and if pain persists, ..  Ultrasound. To r/o gallbladder disease.

## 2015-07-23 NOTE — Assessment & Plan Note (Signed)
Rheumatologic evaluation reviewed.  No signs of inflammatory arthritis

## 2015-07-27 ENCOUNTER — Other Ambulatory Visit: Payer: Self-pay | Admitting: *Deleted

## 2015-07-27 MED ORDER — LACTULOSE 20 GM/30ML PO SOLN: ORAL | Status: DC

## 2015-08-01 ENCOUNTER — Encounter: Payer: Self-pay | Admitting: Internal Medicine

## 2015-08-01 ENCOUNTER — Ambulatory Visit (INDEPENDENT_AMBULATORY_CARE_PROVIDER_SITE_OTHER): Payer: 59 | Admitting: Internal Medicine

## 2015-08-01 VITALS — BP 152/82 | HR 85 | Temp 97.7°F | Resp 12 | Ht 63.0 in | Wt 161.5 lb

## 2015-08-01 DIAGNOSIS — M545 Low back pain, unspecified: Secondary | ICD-10-CM

## 2015-08-01 DIAGNOSIS — M19212 Secondary osteoarthritis, left shoulder: Secondary | ICD-10-CM | POA: Diagnosis not present

## 2015-08-01 DIAGNOSIS — I728 Aneurysm of other specified arteries: Secondary | ICD-10-CM | POA: Diagnosis not present

## 2015-08-01 DIAGNOSIS — M19211 Secondary osteoarthritis, right shoulder: Secondary | ICD-10-CM

## 2015-08-01 DIAGNOSIS — R030 Elevated blood-pressure reading, without diagnosis of hypertension: Secondary | ICD-10-CM

## 2015-08-01 DIAGNOSIS — G8929 Other chronic pain: Secondary | ICD-10-CM

## 2015-08-01 MED ORDER — HYDROCODONE-ACETAMINOPHEN 5-325 MG PO TABS: 1.0000 | ORAL_TABLET | Freq: Two times a day (BID) | ORAL | Status: DC | PRN

## 2015-08-01 MED ORDER — HYDROCODONE-ACETAMINOPHEN 5-325 MG PO TABS: 1.0000 | ORAL_TABLET | Freq: Every evening | ORAL | Status: DC | PRN

## 2015-08-01 NOTE — Progress Notes (Signed)
Pre-visit discussion using our clinic review tool. No additional management support is needed unless otherwise documented below in the visit note.  

## 2015-08-01 NOTE — Progress Notes (Signed)
Subjective:  Patient ID: Cheryl [name], female    DOB: 16-Aug-1955  Age: 60 y.o. MRN: [id number]  CC: The primary encounter diagnosis was Other secondary osteoarthritis of both shoulders. Diagnoses of Chronic lower back pain, Splenic artery aneurysm (HCC), and Elevated blood pressure reading without diagnosis of hypertension were also pertinent to this visit.  HPI Cheryl [name] presents for  Right sided abd and back pain.  Treated for constipation with lactulose, patient drank the entire bottle while at work and Had to leave work due to the diarrhea she had.   The bloating has resolved.  Moving bowels regularly now.    Pain is still present under both rib cages and radiates to both proximal thighs.  The pain is aggravated by sitting.  Using vicodin twice daily ,  AM and PM  for relief of pain and She modifies routine by getting up  Every 2  Hours. However, if she stretches,  She develops diffuse muscle spasms   And around to the back   Had a massage this weekend which did not relieve the pain .  paraspinus muscles.    Worried about spleinc artery aneurysm seen  on recent CT   BP elevated today bc she is in pain   Outpatient Prescriptions Prior to Visit  Medication Sig Dispense Refill  . buPROPion (WELLBUTRIN SR) 100 MG 12 hr tablet Take 1 tablet by mouth two  times daily 180 tablet 2  . Calcium Carbonate-Vit D-Min (CALCIUM 1200 PO) Take 1 tablet by mouth daily.    . Cholecalciferol (VITAMIN D PO) Take by mouth. Pt takes 5000iu daily    . CINNAMON PO Take 2,000 mg by mouth daily.    . clobetasol ointment (TEMOVATE) 0.05 % Apply 1 application topically 2 (two) times daily. Until resolved (Patient taking differently: Apply 1 application topically daily. Until resolved) 30 g 2  . Cyanocobalamin (VITAMIN B 12 PO) Take by mouth.    . Diclofenac Sodium 1.5 % SOLN Apply to painful joint twice daily 150 mL 3  . Docusate Calcium (STOOL SOFTENER PO) Take by mouth.    . EPINEPHrine  (EPIPEN) 0.3 mg/0.3 mL DEVI Inject 0.3 mLs (0.3 mg total) into the muscle once. (Patient taking differently: Inject 0.3 mg into the muscle as needed. ) 1 Device 5  . hydrOXYzine (ATARAX/VISTARIL) 25 MG tablet TAKE 1 TABLET (25 MG TOTAL) BY MOUTH 3 (THREE) TIMES DAILY AS NEEDED FOR ITCHING. 90 tablet 3  . Lactulose 20 GM/30ML SOLN 30 ml every 4 hours until constipation is relieved 236 mL 3  . Multiple Vitamin (MULTIVITAMIN) tablet Take 1 tablet by mouth daily.    . nitroGLYCERIN (NITROSTAT) 0.4 MG SL tablet Place 1 tablet (0.4 mg total) under the tongue every 5 (five) minutes as needed for chest pain. 25 tablet 3  . Omega-3 Fatty Acids (FISH OIL) 1000 MG CAPS Take 1 capsule by mouth daily.    . predniSONE (DELTASONE) 10 MG tablet Take 6 tablets by mouth on day 1 with breakfast then decrease by 1 tablet each day until gone. 21 tablet 0  . progesterone (PROMETRIUM) 200 MG capsule Take 200 mg by mouth daily.    . propranolol (INDERAL) 10 MG tablet Take 1 tablet (10 mg total) by mouth 3 (three) times daily. 21 tablet 6  . propranolol (INDERAL) 10 MG tablet Take 1 tablet (10 mg total) by mouth 3 (three) times daily. 270 tablet 3  . vitamin C (ASCORBIC ACID) 500 MG tablet Take  500 mg by mouth daily.    . Vitamin K, Phytonadione, 100 MCG TABS Take by mouth.    [name]. HYDROcodone-acetaminophen (NORCO/VICODIN) 5-325 MG tablet Take 1 tablet by mouth 2 (two) times daily as needed. May refill on or after May 29 2015 60 tablet 0  . HYDROcodone-acetaminophen (NORCO/VICODIN) 5-325 MG tablet Take 1 tablet by mouth at bedtime as needed and may repeat dose one time if needed. May refill on or after March 1 , 2107 60 tablet 0   No facility-administered medications prior to visit.    Review of Systems;  Patient denies headache, fevers, malaise, unintentional weight loss, skin rash, eye pain, sinus congestion and sinus pain, sore throat, dysphagia,  hemoptysis , cough, dyspnea, wheezing, chest pain, palpitations,  orthopnea, edema, abdominal pain, nausea, melena, diarrhea, constipation, flank pain, dysuria, hematuria, urinary  Frequency, nocturia, numbness, tingling, seizures,  Focal weakness, Loss of consciousness,  Tremor, insomnia, depression, anxiety, and suicidal ideation.      Objective:  BP 152/82 mmHg  Pulse 85  Temp(Src) 97.7 F (36.5 C) (Oral)  Resp 12  Ht 5\' 3"  (1.6 m)  Wt 161 lb 8 oz (73.256 kg)  BMI 28.62 kg/m2  SpO2 98%  BP Readings from Last 3 Encounters:  08/01/15 152/82  07/21/15 138/76  05/19/15 124/68    Wt Readings from Last 3 Encounters:  08/01/15 161 lb 8 oz (73.256 kg)  07/21/15 163 lb 6.4 oz (74.118 kg)  05/19/15 161 lb 12.8 oz (73.392 kg)    General appearance: alert, cooperative and appears stated age Ears: normal TM's and external ear canals both ears Throat: lips, mucosa, and tongue normal; teeth and gums normal Neck: no adenopathy, no carotid bruit, supple, symmetrical, trachea midline and thyroid not enlarged, symmetric, no tenderness/mass/nodules Back: symmetric, no curvature. ROM normal. No CVA tenderness. Lungs: clear to auscultation bilaterally Heart: regular rate and rhythm, S1, S2 normal, no murmur, click, rub or gallop Abdomen: soft, non-tender; bowel sounds normal; no masses,  no organomegaly Pulses: 2+ and symmetric Skin: Skin color, texture, turgor normal. No rashes or lesions Lymph nodes: Cervical, supraclavicular, and axillary nodes normal.  Lab Results  Component Value Date   HGBA1C 5.4 11/25/2013    Lab Results  Component Value Date   CREATININE 0.84 05/19/2015   CREATININE 0.9 03/21/2015   CREATININE 0.9 01/12/2015    Lab Results  Component Value Date   WBC 8.4 03/21/2015   HGB 9.9* 03/21/2015   HCT 30* 03/21/2015   PLT 322 03/21/2015   GLUCOSE 79 05/19/2015   CHOL 136 03/21/2015   TRIG 170* 03/21/2015   HDL 29* 03/21/2015   LDLDIRECT 104* 08/15/2012   LDLCALC 73 03/21/2015   ALT 33 03/21/2015   AST 28 03/21/2015   NA  137 05/19/2015   K 3.8 05/19/2015   CL 105 05/19/2015   CREATININE 0.84 05/19/2015   BUN 13 05/19/2015   CO2 26 05/19/2015   TSH 1.62 03/21/2015   INR 1.0 09/01/2014   HGBA1C 5.4 11/25/2013    Dg Lumbar Spine Complete  07/21/2015  CLINICAL DATA:  Abdominal pain and bloating extending into the back for 3 months. History of UTI. EXAM: LUMBAR SPINE - COMPLETE 4+ VIEW COMPARISON:  CT 09/17/2014. FINDINGS: There are 5 lumbar type vertebral bodies. The alignment is normal. The disc spaces are preserved. There is mild intervertebral spurring and facet hypertrophy, but no evidence of acute fracture or pars defect. The visualized bowel gas pattern is normal. There are small pelvic phleboliths. IMPRESSION:  Mild lumbar spondylosis.  No acute osseous findings or malalignment. Electronically Signed   By: Carey Bullocks M.D.   On: 07/21/2015 17:13   Dg Abd 2 Views  07/21/2015  CLINICAL DATA:  Bloating, recurrent pain EXAM: ABDOMEN - 2 VIEW COMPARISON:  CT abdomen pelvis of 09/17/2014 FINDINGS: Supine and erect views of the abdomen show no bowel obstruction. No free air is noted on the erect view. There is feces throughout the colon. A small curvilinear calcification in the left upper quadrant represents a small splenic artery aneurysm of doubtful clinical significance, unchanged compared to the CT from 09/17/2014. IMPRESSION: 1. No bowel obstruction.  No free air. 2. Moderate amount of feces throughout the colon. Electronically Signed   By: Dwyane Dee M.D.   On: 07/21/2015 17:13    Assessment & Plan:   Problem List Items Addressed This Visit    Chronic lower back pain     Plain films done today show mild spurring without disk space loss and without alignment issues.  She continues to report pain radiating to both thighs managed only with twice daily vicodin use.   MRI lumbar spine ordered to rule out spinal stenosis.       Relevant Medications   HYDROcodone-acetaminophen (NORCO/VICODIN) 5-325 MG tablet    HYDROcodone-acetaminophen (NORCO/VICODIN) 5-325 MG tablet   Splenic artery aneurysm (HCC)    Incidental finding suggested on plain abdominal films done to evaluate bloating and abdominal pain.  NO change compared to April 2016 CT Refer to Vascular Surgery for opinion on management        Other Visit Diagnoses    Other secondary osteoarthritis of both shoulders    -  Primary    Relevant Medications    HYDROcodone-acetaminophen (NORCO/VICODIN) 5-325 MG tablet    HYDROcodone-acetaminophen (NORCO/VICODIN) 5-325 MG tablet    Elevated blood pressure reading without diagnosis of hypertension           I have changed Ms. Burandt's HYDROcodone-acetaminophen and HYDROcodone-acetaminophen. I am also having her maintain her Cholecalciferol (VITAMIN D PO), Calcium Carbonate-Vit D-Min (CALCIUM 1200 PO), CINNAMON PO, vitamin C, Fish Oil, multivitamin, progesterone, Docusate Calcium (STOOL SOFTENER PO), EPINEPHrine, Cyanocobalamin (VITAMIN B 12 PO), hydrOXYzine, clobetasol ointment, Diclofenac Sodium, nitroGLYCERIN, predniSONE, propranolol, propranolol, buPROPion, Vitamin K (Phytonadione), and Lactulose.  Meds ordered this encounter  Medications  . HYDROcodone-acetaminophen (NORCO/VICODIN) 5-325 MG tablet    Sig: Take 1 tablet by mouth at bedtime as needed and may repeat dose one time if needed. May refill on or after A[pril 1 , 2107    Dispense:  60 tablet    Refill:  0  . HYDROcodone-acetaminophen (NORCO/VICODIN) 5-325 MG tablet    Sig: Take 1 tablet by mouth 2 (two) times daily as needed. May refill on or after Sep 26 2015    Dispense:  60 tablet    Refill:  0    Medications Discontinued During This Encounter  Medication Reason  . HYDROcodone-acetaminophen (NORCO/VICODIN) 5-325 MG tablet Reorder  . HYDROcodone-acetaminophen (NORCO/VICODIN) 5-325 MG tablet Reorder   A total of 25 minutes of face to face time was spent with patient more than half of which was spent in counselling about the above  mentioned conditions  and coordination of care  Follow-up: No Follow-up on file.   Sherlene Shams, MD

## 2015-08-01 NOTE — Patient Instructions (Signed)
Referral to Dr Dew/Schnier for evaluation of splenic artery aneurysm  Making sure your  BP stays < 130/80 is very important.   I am ordering an MRI of your lumbar spine to determine if you have spinal stenosis or a pinched nerve

## 2015-08-03 DIAGNOSIS — I728 Aneurysm of other specified arteries: Secondary | ICD-10-CM | POA: Insufficient documentation

## 2015-08-03 NOTE — Assessment & Plan Note (Addendum)
Plain films done today show mild spurring without disk space loss and without alignment issues.  She continues to report pain radiating to both thighs managed only with twice daily vicodin use.   MRI lumbar spine ordered to rule out spinal stenosis.

## 2015-08-03 NOTE — Assessment & Plan Note (Signed)
Incidental finding suggested on plain abdominal films done to evaluate bloating and abdominal pain.  NO change compared to April 2016 CT Refer to Vascular Surgery for opinion on management

## 2015-08-05 ENCOUNTER — Telehealth: Payer: Self-pay | Admitting: Internal Medicine

## 2015-08-05 DIAGNOSIS — I728 Aneurysm of other specified arteries: Secondary | ICD-10-CM

## 2015-08-05 NOTE — Telephone Encounter (Signed)
Pt lvm asking about a referral for her to see vascular. I do not see a referral in her chart. Please advise.  If call back after 330 please call [phone] before 330 please call [phone]

## 2015-08-05 NOTE — Telephone Encounter (Signed)
3/8 OV has order for referral to vascular.  Thanks

## 2015-08-08 NOTE — Telephone Encounter (Signed)
Dr.Tullo, can you please place order for Vascular based on OV note that states referral will be placed on 3/6. Thanks

## 2015-08-08 NOTE — Telephone Encounter (Signed)
Referral is in process as requested 

## 2015-08-08 NOTE — Telephone Encounter (Signed)
Dr. Darrick Huntsmanullo had mentioned the referral in her note from 3/6 but no order or referral was put in for it.

## 2015-08-08 NOTE — Telephone Encounter (Signed)
Melissa, please advise, thanks

## 2015-08-15 ENCOUNTER — Other Ambulatory Visit: Payer: Self-pay | Admitting: Internal Medicine

## 2015-08-15 ENCOUNTER — Telehealth: Payer: Self-pay

## 2015-08-15 DIAGNOSIS — I728 Aneurysm of other specified arteries: Secondary | ICD-10-CM

## 2015-08-15 DIAGNOSIS — K862 Cyst of pancreas: Secondary | ICD-10-CM

## 2015-08-15 NOTE — Telephone Encounter (Signed)
Please advise, thanks.

## 2015-08-15 NOTE — Telephone Encounter (Signed)
We discussed getting an MRI of the lumbar spine to evaluate her low back pain. which is different than the  CT angiogram that would show the spleen and pancreas.  I am not sure if there is an imaging study that would cover both. Melissa, I have ordered the CT angiogram,  But if an MRI of the abdomen would cover both areas well enough  Per radiology, I can change to that.

## 2015-08-15 NOTE — Telephone Encounter (Signed)
Pt states that she is supposed to get a CT of Spine for spinal stenosis. Pt states Dr. Gilda CreaseSchnier wants to have the nodule on the  pancreas and aneurysm on the artery that goes to spleen checked when she goes for CT. Pt states that the vascular doctor could not obtain old reports.Please advise, thanks

## 2015-08-17 ENCOUNTER — Ambulatory Visit
Admission: RE | Admit: 2015-08-17 | Discharge: 2015-08-17 | Disposition: A | Discharge status: Home / Self Care | Payer: 59 | Source: Ambulatory Visit | Attending: Internal Medicine | Admitting: Internal Medicine

## 2015-08-17 DIAGNOSIS — I728 Aneurysm of other specified arteries: Secondary | ICD-10-CM | POA: Diagnosis not present

## 2015-08-17 DIAGNOSIS — I2510 Atherosclerotic heart disease of native coronary artery without angina pectoris: Secondary | ICD-10-CM | POA: Diagnosis not present

## 2015-08-17 DIAGNOSIS — K862 Cyst of pancreas: Secondary | ICD-10-CM | POA: Insufficient documentation

## 2015-08-17 LAB — POCT I-STAT CREATININE: Creatinine, Ser: 0.8 mg/dL (ref 0.44–1.00)

## 2015-08-17 MED ORDER — IOPAMIDOL (ISOVUE-370) INJECTION 76%
100.0000 mL | Freq: Once | INTRAVENOUS | Status: AC | PRN
  Administered 2015-08-17: 100 mL via INTRAVENOUS

## 2015-10-11 ENCOUNTER — Ambulatory Visit: Payer: 59 | Admitting: Internal Medicine

## 2015-10-21 ENCOUNTER — Encounter: Payer: Self-pay | Admitting: Internal Medicine

## 2015-10-21 ENCOUNTER — Ambulatory Visit (INDEPENDENT_AMBULATORY_CARE_PROVIDER_SITE_OTHER): Payer: 59 | Admitting: Internal Medicine

## 2015-10-21 VITALS — BP 140/70 | HR 102 | Temp 98.6°F | Wt 162.0 lb

## 2015-10-21 DIAGNOSIS — M545 Low back pain, unspecified: Secondary | ICD-10-CM

## 2015-10-21 DIAGNOSIS — M19212 Secondary osteoarthritis, left shoulder: Secondary | ICD-10-CM | POA: Diagnosis not present

## 2015-10-21 DIAGNOSIS — M19211 Secondary osteoarthritis, right shoulder: Secondary | ICD-10-CM

## 2015-10-21 DIAGNOSIS — G8929 Other chronic pain: Secondary | ICD-10-CM

## 2015-10-21 DIAGNOSIS — R635 Abnormal weight gain: Secondary | ICD-10-CM | POA: Diagnosis not present

## 2015-10-21 MED ORDER — CELECOXIB 200 MG PO CAPS
200.0000 mg | ORAL_CAPSULE | Freq: Two times a day (BID) | ORAL | Status: DC
Start: 2015-10-21 — End: 2015-12-19

## 2015-10-21 MED ORDER — HYDROCODONE-ACETAMINOPHEN 5-325 MG PO TABS: 1.0000 | ORAL_TABLET | Freq: Two times a day (BID) | ORAL | Status: DC | PRN

## 2015-10-21 MED ORDER — OMEPRAZOLE 40 MG PO CPDR: 40.0000 mg | DELAYED_RELEASE_CAPSULE | Freq: Every day | ORAL | Status: DC

## 2015-10-21 NOTE — Progress Notes (Signed)
Pre visit review using our clinic review tool, if applicable. No additional management support is needed unless otherwise documented below in the visit note. 

## 2015-10-21 NOTE — Progress Notes (Signed)
Subjective:  Patient ID: Cheryl [name], female    DOB: [birth year]  Age: 60 y.o. MRN: [id number]  CC: The primary encounter diagnosis was Other secondary osteoarthritis of both shoulders. Diagnoses of Chronic lower back pain and Weight gain were also pertinent to this visit.  HPI Cheryl [name] presents for follow up on  chronic back and neck pain .  The pain was made worse by Physical Therapy  in February, causing her hands to go numb transiently . Hands are painful in the morning in the joints,  But the pain improves with use.  Still using 2 vicodin daily to manage her chronic back pain.  She   takes one in the morning and one when she gets home from work,     History of gastritis and hives from meloxicam . Willing to try celebrex  Has been participating in a  walking program and using an exercise video 3 times per week   Saw rheumatology for elevated ANA, normal ESR, and CRP.  Was diagnosed with OA of neck   Outpatient Prescriptions Prior to Visit  Medication Sig Dispense Refill  . buPROPion (WELLBUTRIN SR) 100 MG 12 hr tablet Take 1 tablet by mouth two  times daily 180 tablet 2  . Calcium Carbonate-Vit D-Min (CALCIUM 1200 PO) Take 1 tablet by mouth daily.    . Cholecalciferol (VITAMIN D PO) Take by mouth. Pt takes 5000iu daily    . CINNAMON PO Take 2,000 mg by mouth daily.    . clobetasol ointment (TEMOVATE) 6.29 % Apply 1 application topically 2 (two) times daily. Until resolved (Patient taking differently: Apply 1 application topically daily. Until resolved) 30 g 2  . Cyanocobalamin (VITAMIN B 12 PO) Take by mouth.    . Diclofenac Sodium 1.5 % SOLN Apply to painful joint twice daily 150 mL 3  . Docusate Calcium (STOOL SOFTENER PO) Take by mouth.    . EPINEPHrine (EPIPEN) 0.3 mg/0.3 mL DEVI Inject 0.3 mLs (0.3 mg total) into the muscle once. (Patient taking differently: Inject 0.3 mg into the muscle as needed. ) 1 Device 5  . HYDROcodone-acetaminophen (NORCO/VICODIN) 5-325  MG tablet Take 1 tablet by mouth at bedtime as needed and may repeat dose one time if needed. May refill on or after A[pril 1 , 2107 60 tablet 0  . hydrOXYzine (ATARAX/VISTARIL) 25 MG tablet TAKE 1 TABLET (25 MG TOTAL) BY MOUTH 3 (THREE) TIMES DAILY AS NEEDED FOR ITCHING. 90 tablet 3  . Lactulose 20 GM/30ML SOLN 30 ml every 4 hours until constipation is relieved 236 mL 3  . Multiple Vitamin (MULTIVITAMIN) tablet Take 1 tablet by mouth daily.    . nitroGLYCERIN (NITROSTAT) 0.4 MG SL tablet Place 1 tablet (0.4 mg total) under the tongue every 5 (five) minutes as needed for chest pain. 25 tablet 3  . Omega-3 Fatty Acids (FISH OIL) 1000 MG CAPS Take 1 capsule by mouth daily.    . predniSONE (DELTASONE) 10 MG tablet Take 6 tablets by mouth on day 1 with breakfast then decrease by 1 tablet each day until gone. 21 tablet 0  . progesterone (PROMETRIUM) 200 MG capsule Take 200 mg by mouth daily.    . propranolol (INDERAL) 10 MG tablet Take 1 tablet (10 mg total) by mouth 3 (three) times daily. 21 tablet 6  . propranolol (INDERAL) 10 MG tablet Take 1 tablet (10 mg total) by mouth 3 (three) times daily. 270 tablet 3  . vitamin C (ASCORBIC ACID) 500 MG  tablet Take 500 mg by mouth daily.    . Vitamin K, Phytonadione, 100 MCG TABS Take by mouth.    [name] HYDROcodone-acetaminophen (NORCO/VICODIN) 5-325 MG tablet Take 1 tablet by mouth 2 (two) times daily as needed. May refill on or after Sep 26 2015 60 tablet 0   No facility-administered medications prior to visit.    Review of Systems;  Patient denies headache, fevers, malaise, unintentional weight loss, skin rash, eye pain, sinus congestion and sinus pain, sore throat, dysphagia,  hemoptysis , cough, dyspnea, wheezing, chest pain, palpitations, orthopnea, edema, abdominal pain, nausea, melena, diarrhea, constipation, flank pain, dysuria, hematuria, urinary  Frequency, nocturia, numbness, tingling, seizures,  Focal weakness, Loss of consciousness,  Tremor, insomnia,  depression, anxiety, and suicidal ideation.      Objective:  BP 140/70 mmHg  Pulse 102  Temp(Src) 98.6 F (37 C) (Oral)  Wt 162 lb (73.483 kg)  BP Readings from Last 3 Encounters:  10/21/15 140/70  08/01/15 152/82  07/21/15 138/76    Wt Readings from Last 3 Encounters:  10/21/15 162 lb (73.483 kg)  08/01/15 161 lb 8 oz (73.256 kg)  07/21/15 163 lb 6.4 oz (74.118 kg)    General appearance: alert, cooperative and appears stated age Ears: normal TM's and external ear canals both ears Throat: lips, mucosa, and tongue normal; teeth and gums normal Neck: no adenopathy, no carotid bruit, supple, symmetrical, trachea midline and thyroid not enlarged, symmetric, no tenderness/mass/nodules Back: symmetric, no curvature. ROM normal. No CVA tenderness. Lungs: clear to auscultation bilaterally Heart: regular rate and rhythm, S1, S2 normal, no murmur, click, rub or gallop Abdomen: soft, non-tender; bowel sounds normal; no masses,  no organomegaly Pulses: 2+ and symmetric Skin: Skin color, texture, turgor normal. No rashes or lesions Lymph nodes: Cervical, supraclavicular, and axillary nodes normal.  Lab Results  Component Value Date   HGBA1C 5.4 11/25/2013    Lab Results  Component Value Date   CREATININE 0.80 08/17/2015   CREATININE 0.84 05/19/2015   CREATININE 0.9 03/21/2015    Lab Results  Component Value Date   WBC 8.4 03/21/2015   HGB 9.9* 03/21/2015   HCT 30* 03/21/2015   PLT 322 03/21/2015   GLUCOSE 79 05/19/2015   CHOL 136 03/21/2015   TRIG 170* 03/21/2015   HDL 29* 03/21/2015   LDLDIRECT 104* 08/15/2012   LDLCALC 73 03/21/2015   ALT 33 03/21/2015   AST 28 03/21/2015   NA 137 05/19/2015   K 3.8 05/19/2015   CL 105 05/19/2015   CREATININE 0.80 08/17/2015   BUN 13 05/19/2015   CO2 26 05/19/2015   TSH 1.62 03/21/2015   INR 1.0 09/01/2014   HGBA1C 5.4 11/25/2013    Ct Angio Abdomen W/cm &/or Wo Contrast  08/17/2015  CLINICAL DATA:  60 year old female  with mid abdominal pain radiating into the upper back EXAM: CT ANGIOGRAPHY ABDOMEN TECHNIQUE: Multidetector CT imaging of the abdomen was performed using the standard protocol during bolus administration of intravenous contrast. Multiplanar reconstructed images including MIPs were obtained and reviewed to evaluate the vascular anatomy. CONTRAST:  100 mL Isovue 370 COMPARISON:  Prior CT scan of the chest 06/01/2015; prior CT scan of the chest, abdomen and pelvis 09/17/2014 FINDINGS: VASCULAR Aorta: Normal caliber abdominal aorta without evidence of aneurysm or dissection. Peripheral calcified atherosclerotic plaque. No irregularity or ulceration. Celiac: Widely patent. Conventional hepatic arterial anatomy. 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 SMA: Widely patent and unremarkable. Renals: Solitary renal arteries bilaterally. No  stenosis or fibromuscular dysplasia. IMA: Widely patent unremarkable. Inflow: Mild atherosclerotic disease without evidence of significant stenosis. Veins: No focal venous abnormality. NON-VASCULAR Lower Chest: The lung bases are clear. Visualized cardiac structures are within normal limits for size. No pericardial effusion. Unremarkable visualized distal thoracic esophagus. Abdomen: Unremarkable CT appearance of the stomach, duodenum, spleen, adrenal glands and pancreas. Normal hepatic contour and morphology. No discrete hepatic lesion. Gallbladder is unremarkable. No intra or extrahepatic biliary ductal dilatation. Unremarkable appearance of the bilateral kidneys. No focal solid lesion, hydronephrosis or nephrolithiasis. Mild right-sided pelviectasis appear similar compared to prior imaging and is likely physiologic. No focal bowel wall thickening or evidence of obstruction. No free fluid or suspicious adenopathy. Bones/Soft Tissues: No acute fracture or aggressive appearing lytic or blastic osseous lesion. Review of the MIP images confirms the above findings.  IMPRESSION: VASCULAR 1. No acute aortic abnormality, significant stenosis or other acute vascular findings. 2. Scattered calcified atherosclerotic vascular disease without evidence of significant stenosis. 3. 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 NON VASCULAR 1. No acute abnormality in the abdomen or pelvis. Signed, Criselda Peaches, MD [facility] Radiology Electronically Signed   By: Jacqulynn Cadet M.D.   On: 08/17/2015 10:01    Assessment & Plan:   Problem List Items Addressed This Visit    Weight gain    I have addressed  BMI and recommended wt loss of 10% of body weigh over the next 6 months using a low glycemic index diet and regular exercise a minimum of 5 days per week.        Chronic lower back pain     Plain films done today show mild spurring without disk space loss and without alignment issues.  She continues to report pain radiating to both thighs managed only with twice daily vicodin use.   Advised to try substituting celebrex and tylenol for vicodin.   PPI for prevention of gastritis, ,. If pain is not controlled, will obtain MRI and refer to Pain Management. Return in one month        Relevant Medications   celecoxib (CELEBREX) 200 MG capsule   HYDROcodone-acetaminophen (NORCO/VICODIN) 5-325 MG tablet    Other Visit Diagnoses    Other secondary osteoarthritis of both shoulders    -  Primary    Relevant Medications    celecoxib (CELEBREX) 200 MG capsule    HYDROcodone-acetaminophen (NORCO/VICODIN) 5-325 MG tablet      A total of 25 minutes of face to face time was spent with patient more than half of which was spent in counselling about the above mentioned conditions  and coordination of care   I have changed Ms. Catala's HYDROcodone-acetaminophen. I am also having her start on celecoxib and omeprazole. Additionally, I am having her maintain her Cholecalciferol  (VITAMIN D PO), Calcium Carbonate-Vit D-Min (CALCIUM 1200 PO), CINNAMON PO, vitamin C, Fish Oil, multivitamin, progesterone, Docusate Calcium (STOOL SOFTENER PO), EPINEPHrine, Cyanocobalamin (VITAMIN B 12 PO), hydrOXYzine, clobetasol ointment, Diclofenac Sodium, nitroGLYCERIN, predniSONE, propranolol, propranolol, buPROPion, Vitamin K (Phytonadione), Lactulose, and HYDROcodone-acetaminophen.  Meds ordered this encounter  Medications  . celecoxib (CELEBREX) 200 MG capsule    Sig: Take 1 capsule (200 mg total) by mouth 2 (two) times daily.    Dispense:  60 capsule    Refill:  1  . omeprazole (PRILOSEC) 40 MG capsule    Sig: Take 1 capsule (40 mg total) by mouth daily.    Dispense:  30 capsule    Refill:  3  . HYDROcodone-acetaminophen (NORCO/VICODIN) 5-325 MG tablet    Sig: Take 1 tablet by mouth 2 (two) times daily as needed. May refill on or after  Oct 27 2015    Dispense:  60 tablet    Refill:  0    Medications Discontinued During This Encounter  Medication Reason  . HYDROcodone-acetaminophen (NORCO/VICODIN) 5-325 MG tablet Reorder    Follow-up: Return in about 1 month (around 11/21/2015) for pain.   Crecencio Mc, MD

## 2015-10-21 NOTE — Patient Instructions (Addendum)
  I want you to try omitting the morning Vicodin and trying the following regimen instead::  celebrex 200  Mg,   Tylenol 500 mg  And  Omeprazole  In the am (the omeprazole is for  stomach  protection )  If your pain is still not tolerable (NOT GONE,  BUT NOT TOLERABLE) one hour later,  You can take a vicodin   Same routine  in the evening.     For your weight loss (deisred) and calcium needs:  I recommend getting the majority of your calcium and Vitamin D  through diet rather than supplements given the recent association of calcium supplements with increased coronary artery calcium scores  Try the Silk Soy Milk   Delicious,  Low carb,  Low cal,  Cholesterol free and loaded with calcium and protein    You might also try a premixed protein drink called Premier Protein shake in the morning.  It is less $$$ and very low sugar.  160 cal  30 g protein  1 g sugar 50% calcium needs.   Fasting  Labs to be done at [facility] soon    Follow up one month

## 2015-10-22 NOTE — Assessment & Plan Note (Signed)
I have addressed  BMI and recommended wt loss of 10% of body weigh over the next 6 months using a low glycemic index diet and regular exercise a minimum of 5 days per week.   

## 2015-10-22 NOTE — Assessment & Plan Note (Addendum)
Plain films done today show mild spurring without disk space loss and without alignment issues.  She continues to report pain radiating to both thighs managed only with twice daily vicodin use.   Advised to try substituting celebrex and tylenol for vicodin.   PPI for prevention of gastritis, ,. If pain is not controlled, will obtain MRI and refer to Pain Management. Return in one month

## 2015-10-25 ENCOUNTER — Telehealth: Payer: Self-pay

## 2015-10-25 NOTE — Telephone Encounter (Signed)
PA for Celecoxib completed on Cover my meds.

## 2015-10-26 ENCOUNTER — Other Ambulatory Visit: Payer: Self-pay | Admitting: Internal Medicine

## 2015-10-26 ENCOUNTER — Telehealth: Payer: Self-pay | Admitting: Internal Medicine

## 2015-10-26 NOTE — Telephone Encounter (Signed)
Please advise, at last OV patient was told that labs needed to be done soon, fasting.  Please advise and order so I can fax thanks. Last labs were in December.

## 2015-10-26 NOTE — Telephone Encounter (Signed)
Pt called to get order for her fasting labs for today. Pt is at [facility] now and fax number is [phone]. Please call pt  @ [phone] once faxed. Thank you!

## 2015-10-26 NOTE — Telephone Encounter (Signed)
Made aware of the call in hall way I have completed the order had MD sign and has already been faxed. Patient aware.

## 2015-10-27 LAB — CBC WITH DIFFERENTIAL/PLATELET
Basophils Absolute: 0 10*3/uL (ref 0.0–0.2)
Basos: 0 %
EOS (ABSOLUTE): 0.2 10*3/uL (ref 0.0–0.4)
Eos: 2 %
Hematocrit: 28.5 % — ABNORMAL LOW (ref 34.0–46.6)
Hemoglobin: 9.3 g/dL — ABNORMAL LOW (ref 11.1–15.9)
Immature Grans (Abs): 0 10*3/uL (ref 0.0–0.1)
Immature Granulocytes: 0 %
Lymphocytes Absolute: 3.2 10*3/uL — ABNORMAL HIGH (ref 0.7–3.1)
Lymphs: 31 %
MCH: 22.1 pg — ABNORMAL LOW (ref 26.6–33.0)
MCHC: 32.6 g/dL (ref 31.5–35.7)
MCV: 68 fL — ABNORMAL LOW (ref 79–97)
Monocytes Absolute: 0.9 10*3/uL (ref 0.1–0.9)
Monocytes: 8 %
Neutrophils Absolute: 6.1 10*3/uL (ref 1.4–7.0)
Neutrophils: 59 %
Platelets: 323 10*3/uL (ref 150–379)
RBC: 4.21 x10E6/uL (ref 3.77–5.28)
RDW: 18.6 % — ABNORMAL HIGH (ref 12.3–15.4)
WBC: 10.5 10*3/uL (ref 3.4–10.8)

## 2015-10-27 LAB — COMPREHENSIVE METABOLIC PANEL
ALT: 20 IU/L (ref 0–32)
AST: 21 IU/L (ref 0–40)
Albumin/Globulin Ratio: 2.1 (ref 1.2–2.2)
Albumin: 4.4 g/dL (ref 3.6–4.8)
Alkaline Phosphatase: 70 IU/L (ref 39–117)
BUN/Creatinine Ratio: 13 (ref 12–28)
BUN: 13 mg/dL (ref 8–27)
Bilirubin Total: 0.8 mg/dL (ref 0.0–1.2)
CO2: 17 mmol/L — ABNORMAL LOW (ref 18–29)
Calcium: 9.5 mg/dL (ref 8.7–10.3)
Chloride: 102 mmol/L (ref 96–106)
Creatinine, Ser: 0.97 mg/dL (ref 0.57–1.00)
GFR calc Af Amer: 73 mL/min/{1.73_m2} (ref >59)
GFR calc non Af Amer: 64 mL/min/{1.73_m2} (ref >59)
Globulin, Total: 2.1 g/dL (ref 1.5–4.5)
Glucose: 118 mg/dL — ABNORMAL HIGH (ref 65–99)
Potassium: 4.4 mmol/L (ref 3.5–5.2)
Sodium: 139 mmol/L (ref 134–144)
Total Protein: 6.5 g/dL (ref 6.0–8.5)

## 2015-10-27 LAB — LIPID PANEL W/O CHOL/HDL RATIO
Cholesterol, Total: 152 mg/dL (ref 100–199)
HDL: 32 mg/dL — ABNORMAL LOW (ref >39)
LDL Calculated: 98 mg/dL (ref 0–99)
Triglycerides: 109 mg/dL (ref 0–149)
VLDL Cholesterol Cal: 22 mg/dL (ref 5–40)

## 2015-10-27 LAB — TSH: TSH: 0.915 u[IU]/mL (ref 0.450–4.500)

## 2015-10-27 LAB — HGB A1C W/O EAG: Hgb A1c MFr Bld: 5.1 % (ref 4.8–5.6)

## 2015-10-28 ENCOUNTER — Encounter: Payer: Self-pay | Admitting: Internal Medicine

## 2015-11-01 ENCOUNTER — Other Ambulatory Visit: Payer: Self-pay | Admitting: Internal Medicine

## 2015-11-01 ENCOUNTER — Telehealth: Payer: Self-pay | Admitting: *Deleted

## 2015-11-01 DIAGNOSIS — D563 Thalassemia minor: Secondary | ICD-10-CM

## 2015-11-01 NOTE — Telephone Encounter (Signed)
Left a VM to return my call. 

## 2015-11-01 NOTE — Telephone Encounter (Signed)
Spoke with the patient again, she is wearing the CPAP every night   Her husband makes sure it is on as he gets scared that she will stop breathing. Thanks Told her that the referral was placed. thanks

## 2015-11-01 NOTE — Telephone Encounter (Signed)
Spoke with the patient, she has been extremely tired in the recent days.  On Saturday her husband took her to lunch and she fell asleep at the table, then she could barely complete her grocery shopping due to being tired. When she gets home from work she barely stays awake and then when she does go to bed she is out all night.  She know that she is unable to take certain vitamins due to her condition, but she does take a folic acid, B6 and B12 compound, eats beets and spinach daily and her red meat as rare as possible.  Any suggestions? Quested maybe a referral to hematology? Please advise?

## 2015-11-01 NOTE — Telephone Encounter (Signed)
Patient has concerns about iron levels, she would like to get consult on what she could have done about her tiredness.  Patient contact [phone] until 4:30pm

## 2015-11-01 NOTE — Telephone Encounter (Signed)
Hematology referral in process, but excessive sleeping may be due to CPAP not being used or not working right.  Is she wearing it 6 hours or more per night

## 2015-11-09 ENCOUNTER — Encounter: Payer: Self-pay | Admitting: Internal Medicine

## 2015-11-15 ENCOUNTER — Encounter: Payer: Self-pay | Admitting: Internal Medicine

## 2015-11-15 ENCOUNTER — Inpatient Hospital Stay: Payer: 59 | Attending: Internal Medicine | Admitting: Internal Medicine

## 2015-11-15 VITALS — BP 133/84 | HR 82 | Temp 98.8°F | Resp 20 | Wt 162.5 lb

## 2015-11-15 DIAGNOSIS — M129 Arthropathy, unspecified: Secondary | ICD-10-CM | POA: Diagnosis not present

## 2015-11-15 DIAGNOSIS — F17210 Nicotine dependence, cigarettes, uncomplicated: Secondary | ICD-10-CM | POA: Diagnosis not present

## 2015-11-15 DIAGNOSIS — R0602 Shortness of breath: Secondary | ICD-10-CM | POA: Diagnosis not present

## 2015-11-15 DIAGNOSIS — Z79899 Other long term (current) drug therapy: Secondary | ICD-10-CM | POA: Diagnosis not present

## 2015-11-15 DIAGNOSIS — R918 Other nonspecific abnormal finding of lung field: Secondary | ICD-10-CM | POA: Diagnosis not present

## 2015-11-15 DIAGNOSIS — Z803 Family history of malignant neoplasm of breast: Secondary | ICD-10-CM | POA: Diagnosis not present

## 2015-11-15 DIAGNOSIS — R5383 Other fatigue: Secondary | ICD-10-CM | POA: Diagnosis not present

## 2015-11-15 DIAGNOSIS — R002 Palpitations: Secondary | ICD-10-CM

## 2015-11-15 DIAGNOSIS — D509 Iron deficiency anemia, unspecified: Secondary | ICD-10-CM | POA: Insufficient documentation

## 2015-11-15 DIAGNOSIS — D561 Beta thalassemia: Secondary | ICD-10-CM | POA: Diagnosis not present

## 2015-11-15 DIAGNOSIS — E079 Disorder of thyroid, unspecified: Secondary | ICD-10-CM

## 2015-11-15 DIAGNOSIS — Z87440 Personal history of urinary (tract) infections: Secondary | ICD-10-CM

## 2015-11-15 DIAGNOSIS — I728 Aneurysm of other specified arteries: Secondary | ICD-10-CM

## 2015-11-15 DIAGNOSIS — D649 Anemia, unspecified: Secondary | ICD-10-CM | POA: Insufficient documentation

## 2015-11-15 DIAGNOSIS — I447 Left bundle-branch block, unspecified: Secondary | ICD-10-CM | POA: Diagnosis not present

## 2015-11-15 LAB — CBC WITH DIFFERENTIAL/PLATELET
Basophils Absolute: 0.1 10*3/uL (ref 0–0.1)
Basophils Relative: 1 %
Eosinophils Absolute: 0.3 10*3/uL (ref 0–0.7)
Eosinophils Relative: 2 %
HCT: 29.9 % — ABNORMAL LOW (ref 35.0–47.0)
Hemoglobin: 9.6 g/dL — ABNORMAL LOW (ref 12.0–16.0)
Lymphocytes Relative: 22 %
Lymphs Abs: 2.5 10*3/uL (ref 1.0–3.6)
MCH: 22.4 pg — ABNORMAL LOW (ref 26.0–34.0)
MCHC: 31.9 g/dL — ABNORMAL LOW (ref 32.0–36.0)
MCV: 70 fL — ABNORMAL LOW (ref 80.0–100.0)
Monocytes Absolute: 0.8 10*3/uL (ref 0.2–0.9)
Monocytes Relative: 7 %
Neutro Abs: 8 10*3/uL — ABNORMAL HIGH (ref 1.4–6.5)
Neutrophils Relative %: 68 %
Platelets: 259 10*3/uL (ref 150–440)
RBC: 4.27 MIL/uL (ref 3.80–5.20)
RDW: 18 % — ABNORMAL HIGH (ref 11.5–14.5)
WBC: 11.7 10*3/uL — ABNORMAL HIGH (ref 3.6–11.0)

## 2015-11-15 LAB — COMPREHENSIVE METABOLIC PANEL
ALT: 17 U/L (ref 14–54)
AST: 18 U/L (ref 15–41)
Albumin: 4.4 g/dL (ref 3.5–5.0)
Alkaline Phosphatase: 62 U/L (ref 38–126)
Anion gap: 7 (ref 5–15)
BUN: 18 mg/dL (ref 6–20)
CO2: 23 mmol/L (ref 22–32)
Calcium: 9.8 mg/dL (ref 8.9–10.3)
Chloride: 106 mmol/L (ref 101–111)
Creatinine, Ser: 0.85 mg/dL (ref 0.44–1.00)
GFR calc Af Amer: >60 mL/min (ref >60)
GFR calc non Af Amer: >60 mL/min (ref >60)
Glucose, Bld: 97 mg/dL (ref 65–99)
Potassium: 4 mmol/L (ref 3.5–5.1)
Sodium: 136 mmol/L (ref 135–145)
Total Bilirubin: 1.1 mg/dL (ref 0.3–1.2)
Total Protein: 7.2 g/dL (ref 6.5–8.1)

## 2015-11-15 LAB — LACTATE DEHYDROGENASE: LDH: 159 U/L (ref 98–192)

## 2015-11-15 NOTE — Assessment & Plan Note (Signed)
The etiology of fatigue is unclear question related to anemia. However this is fairly new in the last few months. Await workup for anemia.

## 2015-11-15 NOTE — Progress Notes (Signed)
[facility] CONSULT NOTE  Patient Care Team: Sherlene Shamseresa L Tullo, MD as PCP - General (Internal Medicine) Earline MayotteJeffrey W Byrnett, MD as Consulting Physician (General Surgery)  CHIEF COMPLAINTS/PURPOSE OF CONSULTATION:    HEMATOLOGY HISTORY  # CHRONIC MICROCYTIC ANEMIA  # Lung nodules [CT- sub cm Jan 2017]  HISTORY OF PRESENTING ILLNESS:  Cheryl [name] 60 y.o.  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  She complains of extreme fatigue over the last 4-6 months. She is tired all the time. Short of breath on exertion. Denies any weight loss. Denies any night sweats. She complains of palpitations.  Patient stated that she had received blood transfusion/ when she was pregnant with her children. However none recently. She denies any blood in stools or black stools. She takes multivitamins.   ROS: A complete 10 point review of system is done which is negative except mentioned above in history of present illness  MEDICAL HISTORY:  Past Medical History  Diagnosis Date  . Arthritis   . Blood in stool   . Chicken pox   . Generalized headaches   . History of blood transfusion   . Thyroid disease   . UTI (lower urinary tract infection)   . Arrhythmia     left bundle branch block  . LBBB (left bundle branch block)   . 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 Breast surgery  1976  . Appendectomy  1981  . Tonsillectomy and adenoidectomy  1964  . Cystic fibrosis tumor removal  1983  . 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 Cardiac catheterization  2004    [facility]  . Cardiac catheterization  2006    [facility]  . Abdominal hysterectomy  1997  . Breast  biopsy Bilateral     neg    SOCIAL HISTORY: Social History   Social History  . Marital Status: Married    Spouse Name: N/A  . Number of Children: N/A  . Years of Education: N/A   Occupational History  . Not on file.   Social History Main Topics  . Smoking status: Former Smoker -- 0.25 packs/day for 34 years    Types: Cigarettes    Quit date: 04/10/2013  . Smokeless tobacco: Never Used  . Alcohol Use: Yes     Comment: occasional  . Drug Use: No  . Sexual Activity: Not on file   Other Topics Concern  . Not on file   Social History Narrative   Married to [employer] Saulnier   Works for [employer], Engineer, structuraladministrative secretary    FAMILY HISTORY: Family History  Problem Relation Age of Onset  . Heart disease Mother   . Arthritis Mother   . Cancer Mother     breast  . Hyperlipidemia Mother   . Hypertension Mother   . Heart attack Mother   . Breast cancer Mother [id number]  . Heart disease Father   . Cancer Father     hodgkins disease, prostate  . Diabetes Father   . Arthritis Father   . Hypertension Father   . Heart disease Sister   . Diabetes Sister   . Heart disease Brother [id number]    ami x 8,  4 vessel CABG   . Diabetes Brother   . Liver disease Brother   . Lung disease Brother   . Diabetes Maternal Aunt   . Heart disease Maternal Grandmother   . Diabetes Maternal Grandmother   . Heart disease Maternal Grandfather   . Heart disease Paternal Grandmother   . Diabetes Paternal Grandmother   . Stroke Paternal Grandfather   . Heart disease Paternal Grandfather   . Diabetes Paternal Grandfather     ALLERGIES:  is allergic to peanuts; penicillins; sulfa antibiotics; influenza vac split; mobic; and nickel.  MEDICATIONS:  Current Outpatient Prescriptions  Medication Sig Dispense Refill  . buPROPion (WELLBUTRIN SR) 100 MG 12 hr tablet Take 1 tablet by mouth two  times daily 180 tablet 2  . Calcium Carbonate-Vit D-Min (CALCIUM 1200 PO) Take 1 tablet by mouth daily.    . celecoxib  (CELEBREX) 200 MG capsule Take 1 capsule (200 mg total) by mouth 2 (two) times daily. 60 capsule 1  . Cholecalciferol (VITAMIN D PO) Take by mouth. Pt takes 5000iu daily    . CINNAMON PO Take 2,000 mg by mouth daily.    . clobetasol ointment (TEMOVATE) 0.05 % Apply 1 application topically 2 (two) times daily. Until resolved (Patient taking differently: Apply 1 application topically daily. Until resolved) 30 g 2  . Cyanocobalamin (VITAMIN B 12 PO) Take by mouth.    . Diclofenac Sodium 1.5 % SOLN Apply to painful joint twice daily 150 mL 3  . Docusate Calcium (STOOL SOFTENER PO) Take by mouth.    . EPINEPHrine (EPIPEN) 0.3 mg/0.3 mL DEVI Inject 0.3 mLs (0.3 mg total) into the muscle once. (Patient taking differently: Inject 0.3 mg into the muscle as needed. ) 1 Device 5  . HYDROcodone-acetaminophen (NORCO/VICODIN) 5-325 MG tablet Take 1 tablet by mouth 2 (two) times daily as needed. May refill on or after  Oct 27 2015 60 tablet 0  . hydrOXYzine (ATARAX/VISTARIL) 25 MG tablet TAKE 1 TABLET (25 MG TOTAL) BY MOUTH 3 (THREE) TIMES DAILY AS NEEDED FOR ITCHING. 90 tablet 3  . Lactulose 20 GM/30ML SOLN 30 ml every 4 hours until constipation is relieved 236 mL 3  . Multiple Vitamin (MULTIVITAMIN) tablet Take 1 tablet by mouth daily.    . nitroGLYCERIN (NITROSTAT) 0.4 MG SL tablet Place 1 tablet (0.4 mg total) under the tongue every 5 (five) minutes as needed for chest pain. 25 tablet 3  . Omega-3 Fatty Acids (FISH OIL) 1000 MG CAPS Take 1 capsule by mouth daily.    . progesterone (PROMETRIUM) 200 MG capsule Take 200 mg by mouth daily.    . propranolol (INDERAL) 10 MG tablet Take 1 tablet (10 mg total) by mouth 3 (three) times daily. 21 tablet 6  . vitamin C (ASCORBIC ACID) 500 MG tablet Take 500 mg by mouth daily.    . Vitamin K, Phytonadione, 100 MCG TABS Take by mouth.     No current facility-administered medications for this visit.      [name]  PHYSICAL EXAMINATION:   Filed Vitals:   11/15/15 1444   BP: 133/84  Pulse: 82  Temp: 98.8 F (37.1 C)  Resp: 20   Filed Weights   11/15/15 1444  Weight: 162 lb 7.7 oz (73.7 kg)    GENERAL: Well-nourished well-developed; Alert, no distress and comfortable.  Alone.  EYES: no pallor or icterus OROPHARYNX: no thrush or ulceration; good dentition  NECK: supple, no masses felt LYMPH:  no palpable lymphadenopathy in the cervical, axillary  or inguinal regions LUNGS: clear to auscultation and  No wheeze or crackles HEART/CVS: regular rate & rhythm and no murmurs; No lower extremity edema ABDOMEN: abdomen soft, non-tender and normal bowel sounds Musculoskeletal:no cyanosis of digits and no clubbing  PSYCH: alert & oriented x 3 with fluent speech NEURO: no focal motor/sensory deficits SKIN:  no rashes or significant lesions  LABORATORY DATA:  I have reviewed the data as listed Lab Results  Component Value Date   WBC 10.5 10/26/2015   HGB 9.9* 03/21/2015   HCT 28.5* 10/26/2015   MCV 68* 10/26/2015   PLT 323 10/26/2015    Recent Labs  01/12/15 03/21/15 05/19/15 1435 08/17/15 0759 10/26/15 0855  NA 140 137 137  --  139  K 4.3 4.6 3.8  --  4.4  CL  --   --  105  --  102  CO2  --   --  26  --  17*  GLUCOSE  --   --  79  --  118*  BUN --  13  CREATININE 0.9 0.9 0.84 0.80 0.97  CALCIUM  --   --  9.3  --  9.5  GFRNONAA  --   --   --   --  64  GFRAA  --   --   --   --  73  PROT  --   --   --   --  6.5  ALBUMIN  --   --   --   --  4.4  AST 22 28  --   --  21  ALT 21 33  --   --  20  ALKPHOS 74 74  --   --  70  BILITOT  --   --   --   --  0.8     No results found.  ASSESSMENT & PLAN:   Microcytic anemia Chronic microcytic anemia/history of beta thalassemia minor. However this is not explained patient's ongoing severe fatigue for the last few months. Rule out other causes- check CBC CMP and LDH haptoglobin iron studies ferritin; CRP hemoglobin electrophoresis and erythropoietin; B12 folic acid.  Recommend follow-up  in approximately 1 week to review the above workup; next plan of care.   Fatigue The etiology of fatigue is unclear question related to anemia. However this is fairly new in the last few months. Await workup for anemia.   All questions were answered. The patient knows to call the clinic with any problems, questions or concerns.  Thank you Dr.Tullo for allowing me to participate in the care of your pleasant patient. Please do not hesitate to contact me with questions or concerns in the interim.    Earna Coder, MD 11/15/2015 3:37 PM

## 2015-11-15 NOTE — Progress Notes (Signed)
Patient here as new evaluation regarding thalassemia.  Referred by Dr. Darrick Huntsmanullo.  Patient states she has enlarged nodes in her thyroid, breast, right lung and tail of pancreas.  Patient states she has no energy.  States it is all she can do to get through the day at work.  States she gets dizzy a lot as well.

## 2015-11-15 NOTE — Assessment & Plan Note (Signed)
Chronic microcytic anemia/history of beta thalassemia minor. However this is not explained patient's ongoing severe fatigue for the last few months. Rule out other causes- check CBC CMP and LDH haptoglobin iron studies ferritin; CRP hemoglobin electrophoresis and erythropoietin; B12 folic acid.  Recommend follow-up in approximately 1 week to review the above workup; next plan of care.

## 2015-11-16 LAB — VITAMIN B12: Vitamin B-12: 2378 pg/mL — ABNORMAL HIGH (ref 180–914)

## 2015-11-16 LAB — IRON AND TIBC
Iron: 65 ug/dL (ref 28–170)
Saturation Ratios: 19 % (ref 10.4–31.8)
TIBC: 339 ug/dL (ref 250–450)
UIBC: 274 ug/dL

## 2015-11-16 LAB — FERRITIN: Ferritin: 83 ng/mL (ref 11–307)

## 2015-11-16 LAB — C-REACTIVE PROTEIN: CRP: 0.8 mg/dL (ref <1.0)

## 2015-11-16 LAB — FOLATE: Folate: 88.3 ng/mL (ref >5.9)

## 2015-11-18 LAB — IMMUNOFIXATION ELECTROPHORESIS
IgA: 119 mg/dL (ref 87–352)
IgG (Immunoglobin G), Serum: 673 mg/dL — ABNORMAL LOW (ref 700–1600)
IgM, Serum: 97 mg/dL (ref 26–217)
Total Protein ELP: 6.6 g/dL (ref 6.0–8.5)

## 2015-11-22 LAB — KAPPA/LAMBDA LIGHT CHAINS
Kappa free light chain: 16.7 mg/L (ref 3.3–19.4)
Kappa, lambda light chain ratio: 1.14 (ref 0.26–1.65)
Lambda free light chains: 14.6 mg/L (ref 5.7–26.3)

## 2015-11-22 LAB — HEMOGLOBINOPATHY EVALUATION
Hgb A2 Quant: 4.7 % — ABNORMAL HIGH (ref 0.7–3.1)
Hgb A: 91.9 % — ABNORMAL LOW (ref 94.0–98.0)
Hgb C: 0 %
Hgb F Quant: 3.4 % — ABNORMAL HIGH (ref 0.0–2.0)
Hgb S Quant: 0 %

## 2015-11-22 LAB — HAPTOGLOBIN: Haptoglobin: 32 mg/dL — ABNORMAL LOW (ref 34–200)

## 2015-11-22 LAB — ERYTHROPOIETIN: Erythropoietin: 52.1 m[IU]/mL — ABNORMAL HIGH (ref 2.6–18.5)

## 2015-11-25 ENCOUNTER — Inpatient Hospital Stay (HOSPITAL_BASED_OUTPATIENT_CLINIC_OR_DEPARTMENT_OTHER): Payer: 59 | Admitting: Internal Medicine

## 2015-11-25 VITALS — BP 141/77 | HR 100 | Temp 96.6°F | Resp 18 | Wt 160.7 lb

## 2015-11-25 DIAGNOSIS — R0602 Shortness of breath: Secondary | ICD-10-CM

## 2015-11-25 DIAGNOSIS — M129 Arthropathy, unspecified: Secondary | ICD-10-CM

## 2015-11-25 DIAGNOSIS — D561 Beta thalassemia: Secondary | ICD-10-CM | POA: Diagnosis not present

## 2015-11-25 DIAGNOSIS — R918 Other nonspecific abnormal finding of lung field: Secondary | ICD-10-CM | POA: Diagnosis not present

## 2015-11-25 DIAGNOSIS — Z79899 Other long term (current) drug therapy: Secondary | ICD-10-CM

## 2015-11-25 DIAGNOSIS — E079 Disorder of thyroid, unspecified: Secondary | ICD-10-CM

## 2015-11-25 DIAGNOSIS — R002 Palpitations: Secondary | ICD-10-CM

## 2015-11-25 DIAGNOSIS — I447 Left bundle-branch block, unspecified: Secondary | ICD-10-CM

## 2015-11-25 DIAGNOSIS — F17210 Nicotine dependence, cigarettes, uncomplicated: Secondary | ICD-10-CM

## 2015-11-25 DIAGNOSIS — I728 Aneurysm of other specified arteries: Secondary | ICD-10-CM

## 2015-11-25 DIAGNOSIS — D509 Iron deficiency anemia, unspecified: Secondary | ICD-10-CM

## 2015-11-25 DIAGNOSIS — R5383 Other fatigue: Secondary | ICD-10-CM | POA: Diagnosis not present

## 2015-11-25 DIAGNOSIS — Z803 Family history of malignant neoplasm of breast: Secondary | ICD-10-CM

## 2015-11-25 DIAGNOSIS — Z87440 Personal history of urinary (tract) infections: Secondary | ICD-10-CM

## 2015-11-25 NOTE — Assessment & Plan Note (Addendum)
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 Possible iron deficiency especially patient is most symptomatic-extreme fatigue the last many months. Intolerant to by mouth iron.  # Recommend IV Venofer weekly 4; follow-up with CBC/with me in 6 weeks. Reevaluate symptoms/labs- and then decide if patient needs more IV iron. If patient does not respond symptoms- I would hold off further IV iron.   # Lung nodules- monitor for now.

## 2015-11-25 NOTE — Progress Notes (Signed)
[facility] CONSULT NOTE  Patient Care Team: Sherlene Shams, MD as PCP - General (Internal Medicine) Earline Mayotte, MD as Consulting Physician (General Surgery)  CHIEF COMPLAINTS/PURPOSE OF CONSULTATION:    HEMATOLOGY HISTORY  # CHRONIC MICROCYTIC ANEMIA- BETA THALASSEMIA MINOR [colo- 2016; Dr. Fanny Skates July 2017- IV trial of Venofer x4   # Lung nodules [CT- sub cm Jan 2017]  HISTORY OF PRESENTING ILLNESS:  Cheryl [name] 60 y.o.  female with a history of beta thalassemia minor- is here to review the results of her blood work/given her severe fatigue.  Patient continues to complain of extreme fatigue over the last 4-6 months. She is tired all the time. Short of breath on exertion. Denies any weight loss. Denies any night sweats. Continues to complain of significant palpitations.  No nausea no vomiting no blood in stools.   ROS: A complete 10 point review of system is done which is negative except mentioned above in history of present illness  MEDICAL HISTORY:  Past Medical History  Diagnosis Date  . Arthritis   . Blood in stool   . Chicken pox   . Generalized headaches   . History of blood transfusion   . Thyroid disease   . UTI (lower urinary tract infection)   . Arrhythmia     left bundle branch block  . LBBB (left bundle branch block)   . 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 Breast surgery  1976  . Appendectomy  1981  . Tonsillectomy and adenoidectomy  1964  . Cystic fibrosis tumor removal  1983  . 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 Cardiac catheterization  2004    [facility]  . Cardiac catheterization  2006    [facility]  . Abdominal hysterectomy  1997  . Breast biopsy Bilateral     neg    SOCIAL HISTORY: Social History   Social  History  . Marital Status: Married    Spouse Name: N/A  . Number of Children: N/A  . Years of Education: N/A   Occupational History  . Not on file.   Social History Main Topics  . Smoking status: Former Smoker -- 0.25 packs/day for 34 years    Types: Cigarettes    Quit date: 04/10/2013  . Smokeless tobacco: Never Used  . Alcohol Use: Yes     Comment: occasional  . Drug Use: No  . Sexual Activity: Not on file   Other Topics Concern  . Not on file   Social History Narrative   Married to [employer]   Works for [employer], Engineer, structural    FAMILY HISTORY: Family History  Problem Relation Age of Onset  . Heart disease Mother   . Arthritis Mother   . Cancer Mother     breast  . Hyperlipidemia Mother   . Hypertension Mother   . Heart attack Mother   . Breast cancer Mother 21  . Heart disease Father   . Cancer Father     hodgkins disease, prostate  . Diabetes Father   . Arthritis Father   . Hypertension Father   . Heart disease Sister   . Diabetes Sister   . Heart disease Brother 41    ami x 8,  4 vessel CABG   . Diabetes Brother   .  Liver disease Brother   . Lung disease Brother   . Diabetes Maternal Aunt   . Heart disease Maternal Grandmother   . Diabetes Maternal Grandmother   . Heart disease Maternal Grandfather   . Heart disease Paternal Grandmother   . Diabetes Paternal Grandmother   . Stroke Paternal Grandfather   . Heart disease Paternal Grandfather   . Diabetes Paternal Grandfather     ALLERGIES:  is allergic to peanuts; penicillins; sulfa antibiotics; influenza vac split; mobic; and nickel.  MEDICATIONS:  Current Outpatient Prescriptions  Medication Sig Dispense Refill  . buPROPion (WELLBUTRIN SR) 100 MG 12 hr tablet Take 1 tablet by mouth two  times daily 180 tablet 2  . Calcium Carbonate-Vit D-Min (CALCIUM 1200 PO) Take 1 tablet by mouth daily.    . celecoxib (CELEBREX) 200 MG capsule Take 1 capsule (200 mg total) by mouth 2 (two) times  daily. 60 capsule 1  . Cholecalciferol (VITAMIN D PO) Take by mouth. Pt takes 5000iu daily    . CINNAMON PO Take 2,000 mg by mouth daily.    . clobetasol ointment (TEMOVATE) 0.05 % Apply 1 application topically 2 (two) times daily. Until resolved (Patient taking differently: Apply 1 application topically daily. Until resolved) 30 g 2  . Cyanocobalamin (VITAMIN B 12 PO) Take by mouth.    . Diclofenac Sodium 1.5 % SOLN Apply to painful joint twice daily 150 mL 3  . Docusate Calcium (STOOL SOFTENER PO) Take by mouth.    . EPINEPHrine (EPIPEN) 0.3 mg/0.3 mL DEVI Inject 0.3 mLs (0.3 mg total) into the muscle once. (Patient taking differently: Inject 0.3 mg into the muscle as needed. ) 1 Device 5  . HYDROcodone-acetaminophen (NORCO/VICODIN) 5-325 MG tablet Take 1 tablet by mouth 2 (two) times daily as needed. May refill on or after  Oct 27 2015 60 tablet 0  . hydrOXYzine (ATARAX/VISTARIL) 25 MG tablet TAKE 1 TABLET (25 MG TOTAL) BY MOUTH 3 (THREE) TIMES DAILY AS NEEDED FOR ITCHING. 90 tablet 3  . Lactulose 20 GM/30ML SOLN 30 ml every 4 hours until constipation is relieved 236 mL 3  . Multiple Vitamin (MULTIVITAMIN) tablet Take 1 tablet by mouth daily.    . nitroGLYCERIN (NITROSTAT) 0.4 MG SL tablet Place 1 tablet (0.4 mg total) under the tongue every 5 (five) minutes as needed for chest pain. 25 tablet 3  . Omega-3 Fatty Acids (FISH OIL) 1000 MG CAPS Take 1 capsule by mouth daily.    . progesterone (PROMETRIUM) 200 MG capsule Take 200 mg by mouth daily.    . propranolol (INDERAL) 10 MG tablet Take 1 tablet (10 mg total) by mouth 3 (three) times daily. 21 tablet 6  . vitamin C (ASCORBIC ACID) 500 MG tablet Take 500 mg by mouth daily.    . Vitamin K, Phytonadione, 100 MCG TABS Take by mouth.     No current facility-administered medications for this visit.      [name].  PHYSICAL EXAMINATION:   Filed Vitals:   11/25/15 1411  BP: 141/77  Pulse: 100  Temp: 96.6 F (35.9 C)  Resp: 18   Filed Weights    11/25/15 1411  Weight: 160 lb 11.5 oz (72.9 kg)    GENERAL: Well-nourished well-developed; Alert, no distress and comfortable.  Alone.  EYES: no pallor or icterus OROPHARYNX: no thrush or ulceration; good dentition  NECK: supple, no masses felt LYMPH:  no palpable lymphadenopathy in the cervical, axillary or inguinal regions LUNGS: clear to auscultation and  No wheeze  or crackles HEART/CVS: regular rate & rhythm and no murmurs; No lower extremity edema ABDOMEN: abdomen soft, non-tender and normal bowel sounds Musculoskeletal:no cyanosis of digits and no clubbing  PSYCH: alert & oriented x 3 with fluent speech NEURO: no focal motor/sensory deficits SKIN:  no rashes or significant lesions  LABORATORY DATA:  I have reviewed the data as listed Lab Results  Component Value Date   WBC 11.7* 11/15/2015   HGB 9.6* 11/15/2015   HCT 29.9* 11/15/2015   MCV 70.0* 11/15/2015   PLT 259 11/15/2015    Recent Labs  03/21/15  05/19/15 1435 08/17/15 0759 10/26/15 0855 11/15/15 1538  NA 137  --  137  --  139 136  K 4.6  --  3.8  --  4.4 4.0  CL  --   --  105  --  102 106  CO2  --   --  26  --  17* 23  GLUCOSE  --   --  79  --  118* 97  BUN 11  --  13  --  13 18  CREATININE 0.9  < > 0.84 0.80 0.97 0.85  CALCIUM  --   --  9.3  --  9.5 9.8  GFRNONAA  --   --   --   --  64 >60  GFRAA  --   --   --   --  73 >60  PROT  --   --   --   --  6.5 7.2  ALBUMIN  --   --   --   --  4.4 4.4  AST 28  --   --   --  21 18  ALT 33  --   --   --  20 17  ALKPHOS 74  --   --   --  70 62  BILITOT  --   --   --   --  0.8 1.1  < > = values in this interval not displayed.   No results found.  ASSESSMENT & PLAN:   Iron deficiency anemia # Chronic microcytic anemia/history of beta thalassemia minor; however RDW is elevated/saturation 19%. Possible iron deficiency especially patient is most symptomatic-extreme fatigue the last many months. Intolerant to by mouth iron.  # Recommend IV Venofer weekly 4;  follow-up with CBC/with me in 6 weeks. Reevaluate symptoms/labs- and then decide if patient needs more IV iron. If patient does not respond symptoms- I would hold off further IV iron.   # Lung nodules- monitor for now.    All questions were answered. The patient knows to call the clinic with any problems, questions or concerns.    Earna CoderGovinda R Brahmanday, MD 11/25/2015 5:47 PM

## 2015-11-25 NOTE — Progress Notes (Signed)
Patient states she is having a hard time getting her breath today.  O2 saturation 99%.  States  She has a lot of pressure in her upper abdomen.

## 2015-12-02 ENCOUNTER — Ambulatory Visit (INDEPENDENT_AMBULATORY_CARE_PROVIDER_SITE_OTHER): Payer: 59 | Admitting: Internal Medicine

## 2015-12-02 ENCOUNTER — Encounter: Payer: Self-pay | Admitting: Internal Medicine

## 2015-12-02 ENCOUNTER — Inpatient Hospital Stay: Payer: 59

## 2015-12-02 VITALS — BP 126/70 | HR 82 | Temp 98.6°F | Resp 12 | Ht 63.0 in | Wt 162.2 lb

## 2015-12-02 DIAGNOSIS — M545 Low back pain: Secondary | ICD-10-CM

## 2015-12-02 DIAGNOSIS — G8929 Other chronic pain: Secondary | ICD-10-CM

## 2015-12-02 DIAGNOSIS — M7061 Trochanteric bursitis, right hip: Secondary | ICD-10-CM | POA: Insufficient documentation

## 2015-12-02 DIAGNOSIS — M19211 Secondary osteoarthritis, right shoulder: Secondary | ICD-10-CM | POA: Diagnosis not present

## 2015-12-02 DIAGNOSIS — D509 Iron deficiency anemia, unspecified: Secondary | ICD-10-CM | POA: Diagnosis not present

## 2015-12-02 DIAGNOSIS — M19212 Secondary osteoarthritis, left shoulder: Secondary | ICD-10-CM | POA: Diagnosis not present

## 2015-12-02 MED ORDER — TRAMADOL HCL 50 MG PO TABS: 50.0000 mg | ORAL_TABLET | Freq: Three times a day (TID) | ORAL | Status: DC | PRN

## 2015-12-02 NOTE — Progress Notes (Signed)
Subjective:  Patient ID: Cheryl LaineKathryn Ann [name], female    DOB: 12/14/1955  Age: 60 y.o. MRN: [id number]  CC: The primary encounter diagnosis was Other secondary osteoarthritis of both shoulders. Diagnoses of Iron deficiency anemia and Chronic lower back pain were also pertinent to this visit.  HPI Cheryl LaineKathryn Ann Toso presents for follow up on chronic pain involving shoulders, neck and lower back ,and severe fatigue.   . At last visit one month ago,  Twice daily chronic use of vicodin was addressed and discouraged in favor of a tirla of no naracotics. and celebrex/tylenol was offered as an alternative.  The celebrex has helped,  She is Taking it with breakfast  , helping the neck and shoulder,  Still having Pain in the shoulder top that radiates to the deltoid with forward lifting of arm  to shoulder level ,  suggesting impingement.  Has had a shoulder injection  in the past by Dr Terrilee FilesZach Smith with Kenalog which provided pain relief for months.   She has limited her use of vicodin to once at night .  Had two falls recently    Fatigue: she has been referred to Hematology for evaluation  thalassemia minor ,and an iron infusion planned for Monday .     Outpatient Prescriptions Prior to Visit  Medication Sig Dispense Refill  . buPROPion (WELLBUTRIN SR) 100 MG 12 hr tablet Take 1 tablet by mouth two  times daily 180 tablet 2  . Calcium Carbonate-Vit D-Min (CALCIUM 1200 PO) Take 1 tablet by mouth daily.    . celecoxib (CELEBREX) 200 MG capsule Take 1 capsule (200 mg total) by mouth 2 (two) times daily. 60 capsule 1  . Cholecalciferol (VITAMIN D PO) Take by mouth. Pt takes 5000iu daily    . CINNAMON PO Take 2,000 mg by mouth daily.    . clobetasol ointment (TEMOVATE) 0.05 % Apply 1 application topically 2 (two) times daily. Until resolved (Patient taking differently: Apply 1 application topically daily. Until resolved) 30 g 2  . Cyanocobalamin (VITAMIN B 12 PO) Take by mouth.    . Diclofenac Sodium 1.5  % SOLN Apply to painful joint twice daily 150 mL 3  . Docusate Calcium (STOOL SOFTENER PO) Take by mouth.    . EPINEPHrine (EPIPEN) 0.3 mg/0.3 mL DEVI Inject 0.3 mLs (0.3 mg total) into the muscle once. (Patient taking differently: Inject 0.3 mg into the muscle as needed. ) 1 Device 5  . HYDROcodone-acetaminophen (NORCO/VICODIN) 5-325 MG tablet Take 1 tablet by mouth 2 (two) times daily as needed. May refill on or after  Oct 27 2015 60 tablet 0  . hydrOXYzine (ATARAX/VISTARIL) 25 MG tablet TAKE 1 TABLET (25 MG TOTAL) BY MOUTH 3 (THREE) TIMES DAILY AS NEEDED FOR ITCHING. 90 tablet 3  . Lactulose 20 GM/30ML SOLN 30 ml every 4 hours until constipation is relieved 236 mL 3  . Multiple Vitamin (MULTIVITAMIN) tablet Take 1 tablet by mouth daily.    . nitroGLYCERIN (NITROSTAT) 0.4 MG SL tablet Place 1 tablet (0.4 mg total) under the tongue every 5 (five) minutes as needed for chest pain. 25 tablet 3  . Omega-3 Fatty Acids (FISH OIL) 1000 MG CAPS Take 1 capsule by mouth daily.    . progesterone (PROMETRIUM) 200 MG capsule Take 200 mg by mouth daily.    . propranolol (INDERAL) 10 MG tablet Take 1 tablet (10 mg total) by mouth 3 (three) times daily. 21 tablet 6  . vitamin C (ASCORBIC ACID) 500 MG tablet  Take 500 mg by mouth daily.    . Vitamin K, Phytonadione, 100 MCG TABS Take by mouth.     No facility-administered medications prior to visit.    Review of Systems;  Patient denies headache, fevers, malaise, unintentional weight loss, skin rash, eye pain, sinus congestion and sinus pain, sore throat, dysphagia,  hemoptysis , cough, dyspnea, wheezing, chest pain, palpitations, orthopnea, edema, abdominal pain, nausea, melena, diarrhea, constipation, flank pain, dysuria, hematuria, urinary  Frequency, nocturia, numbness, tingling, seizures,  Focal weakness, Loss of consciousness,  Tremor, insomnia, depression, anxiety, and suicidal ideation.      Objective:  BP 126/70 mmHg  Pulse 82  Temp(Src) 98.6 F  (37 C) (Oral)  Resp 12  Ht 5\' 3"  (1.6 m)  Wt 162 lb 4 oz (73.596 kg)  BMI 28.75 kg/m2  SpO2 97%  BP Readings from Last 3 Encounters:  12/02/15 126/70  11/25/15 141/77  11/15/15 133/84    Wt Readings from Last 3 Encounters:  12/02/15 162 lb 4 oz (73.596 kg)  11/25/15 160 lb 11.5 oz (72.9 kg)  11/15/15 162 lb 7.7 oz (73.7 kg)    General appearance: alert, cooperative and appears stated age Ears: normal TM's and external ear canals both ears Throat: lips, mucosa, and tongue normal; teeth and gums normal Neck: no adenopathy, no carotid bruit, supple, symmetrical, trachea midline and thyroid not enlarged, symmetric, no tenderness/mass/nodules Back: symmetric, no curvature. ROM normal. No CVA tenderness. Lungs: clear to auscultation bilaterally Heart: regular rate and rhythm, S1, S2 normal, no murmur, click, rub or gallop Abdomen: soft, non-tender; bowel sounds normal; no masses,  no organomegaly Pulses: 2+ and symmetric Skin: Skin color, texture, turgor normal. No rashes or lesions Lymph nodes: Cervical, supraclavicular, and axillary nodes normal.  Lab Results  Component Value Date   HGBA1C 5.1 10/26/2015   HGBA1C 5.4 11/25/2013    Lab Results  Component Value Date   CREATININE 0.85 11/15/2015   CREATININE 0.97 10/26/2015   CREATININE 0.80 08/17/2015    Lab Results  Component Value Date   WBC 11.7* 11/15/2015   HGB 9.6* 11/15/2015   HCT 29.9* 11/15/2015   PLT 259 11/15/2015   GLUCOSE 97 11/15/2015   CHOL 152 10/26/2015   TRIG 109 10/26/2015   HDL 32* 10/26/2015   LDLDIRECT 104* 08/15/2012   LDLCALC 98 10/26/2015   ALT 17 11/15/2015   AST 18 11/15/2015   NA 136 11/15/2015   K 4.0 11/15/2015   CL 106 11/15/2015   CREATININE 0.85 11/15/2015   BUN 18 11/15/2015   CO2 23 11/15/2015   TSH 0.915 10/26/2015   INR 1.0 09/01/2014   HGBA1C 5.1 10/26/2015    Ct Angio Abdomen W/cm &/or Wo Contrast  08/17/2015  CLINICAL DATA:  60 year old female with mid abdominal  pain radiating into the upper back EXAM: CT ANGIOGRAPHY ABDOMEN TECHNIQUE: Multidetector CT imaging of the abdomen was performed using the standard protocol during bolus administration of intravenous contrast. Multiplanar reconstructed images including MIPs were obtained and reviewed to evaluate the vascular anatomy. CONTRAST:  100 mL Isovue 370 COMPARISON:  Prior CT scan of the chest 06/01/2015; prior CT scan of the chest, abdomen and pelvis 09/17/2014 FINDINGS: VASCULAR Aorta: Normal caliber abdominal aorta without evidence of aneurysm or dissection. Peripheral calcified atherosclerotic plaque. No irregularity or ulceration. Celiac: Widely patent. Conventional hepatic arterial anatomy. 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 SMA: Widely patent and unremarkable. Renals: Solitary renal arteries bilaterally. No stenosis or fibromuscular dysplasia. IMA:  Widely patent unremarkable. Inflow: Mild atherosclerotic disease without evidence of significant stenosis. Veins: No focal venous abnormality. NON-VASCULAR Lower Chest: The lung bases are clear. Visualized cardiac structures are within normal limits for size. No pericardial effusion. Unremarkable visualized distal thoracic esophagus. Abdomen: Unremarkable CT appearance of the stomach, duodenum, spleen, adrenal glands and pancreas. Normal hepatic contour and morphology. No discrete hepatic lesion. Gallbladder is unremarkable. No intra or extrahepatic biliary ductal dilatation. Unremarkable appearance of the bilateral kidneys. No focal solid lesion, hydronephrosis or nephrolithiasis. Mild right-sided pelviectasis appear similar compared to prior imaging and is likely physiologic. No focal bowel wall thickening or evidence of obstruction. No free fluid or suspicious adenopathy. Bones/Soft Tissues: No acute fracture or aggressive appearing lytic or blastic osseous lesion. Review of the MIP images confirms the above findings. IMPRESSION:  VASCULAR 1. No acute aortic abnormality, significant stenosis or other acute vascular findings. 2. Scattered calcified atherosclerotic vascular disease without evidence of significant stenosis. 3. 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 NON VASCULAR 1. No acute abnormality in the abdomen or pelvis. Signed, Sterling Big, MD [facility] Electronically Signed   By: Malachy Moan M.D.   On: 08/17/2015 10:01    Assessment & Plan:   Problem List Items Addressed This Visit    Chronic lower back pain    Previously managed with bid vicodin , whicih I have now discouraged,  Using celebrex and tramadol      Relevant Medications   traMADol (ULTRAM) 50 MG tablet   Iron deficiency anemia    Per Hematology.  Iron infusion planned.        Other Visit Diagnoses    Other secondary osteoarthritis of both shoulders    -  Primary    Relevant Medications    traMADol (ULTRAM) 50 MG tablet       I am having Ms. Fatima maintain her Cholecalciferol (VITAMIN D PO), Calcium Carbonate-Vit D-Min (CALCIUM 1200 PO), CINNAMON PO, vitamin C, Fish Oil, multivitamin, progesterone, Docusate Calcium (STOOL SOFTENER PO), EPINEPHrine, Cyanocobalamin (VITAMIN B 12 PO), hydrOXYzine, clobetasol ointment, Diclofenac Sodium, nitroGLYCERIN, propranolol, buPROPion, Vitamin K (Phytonadione), Lactulose, celecoxib, HYDROcodone-acetaminophen, and traMADol.  Meds ordered this encounter  Medications  . DISCONTD: traMADol (ULTRAM) 50 MG tablet    Sig: Take 1 tablet (50 mg total) by mouth every 8 (eight) hours as needed.    Dispense:  90 tablet    Refill:  2  . traMADol (ULTRAM) 50 MG tablet    Sig: Take 1 tablet (50 mg total) by mouth every 8 (eight) hours as needed.    Dispense:  90 tablet    Refill:  2   A total of 25 minutes of face to face time was spent with patient more than half of which was spent in  counselling about the above mentioned conditions  and coordination of care  Medications Discontinued During This Encounter  Medication Reason  . traMADol (ULTRAM) 50 MG tablet Reorder    Follow-up: No Follow-up on file.   Sherlene Shams, MD

## 2015-12-02 NOTE — Patient Instructions (Signed)
I am adding a pain reliever  Called tramadol to take if needed for daytime/nighttime pain , It is less strong than vicodin and can be combined with celebrex and tylenol.     You have an "impingement syndrome" of the left shoulder.  I will refer you back to Dr Katrinka BlazingSmith for an injection   I hope the iron infusion helps your energy level!

## 2015-12-02 NOTE — Progress Notes (Signed)
Pre-visit discussion using our clinic review tool. No additional management support is needed unless otherwise documented below in the visit note.  

## 2015-12-03 NOTE — Assessment & Plan Note (Signed)
Previously managed with bid vicodin , whicih I have now discouraged,  Using celebrex and tramadol

## 2015-12-03 NOTE — Assessment & Plan Note (Signed)
Per Hematology.  Iron infusion planned.

## 2015-12-05 ENCOUNTER — Inpatient Hospital Stay: Payer: 59 | Attending: Internal Medicine

## 2015-12-05 VITALS — BP 129/80 | HR 85 | Temp 98.5°F | Resp 20

## 2015-12-05 DIAGNOSIS — Z79899 Other long term (current) drug therapy: Secondary | ICD-10-CM | POA: Diagnosis not present

## 2015-12-05 DIAGNOSIS — D509 Iron deficiency anemia, unspecified: Secondary | ICD-10-CM | POA: Diagnosis present

## 2015-12-05 MED ORDER — SODIUM CHLORIDE 0.9 % IV SOLN
Freq: Once | INTRAVENOUS | Status: AC
  Administered 2015-12-05: 15:00:00 via INTRAVENOUS
  Filled 2015-12-05: qty 1000

## 2015-12-05 MED ORDER — SODIUM CHLORIDE 0.9 % IV SOLN
200.0000 mg | Freq: Once | INTRAVENOUS | Status: AC
  Administered 2015-12-05: 200 mg via INTRAVENOUS
  Filled 2015-12-05: qty 10

## 2015-12-06 ENCOUNTER — Ambulatory Visit: Payer: 59

## 2015-12-09 ENCOUNTER — Inpatient Hospital Stay: Payer: 59

## 2015-12-09 DIAGNOSIS — D509 Iron deficiency anemia, unspecified: Secondary | ICD-10-CM

## 2015-12-09 MED ORDER — SODIUM CHLORIDE 0.9 % IV SOLN
Freq: Once | INTRAVENOUS | Status: AC
  Administered 2015-12-09: 14:00:00 via INTRAVENOUS
  Filled 2015-12-09: qty 1000

## 2015-12-09 MED ORDER — SODIUM CHLORIDE 0.9 % IV SOLN
200.0000 mg | Freq: Once | INTRAVENOUS | Status: AC
  Administered 2015-12-09: 200 mg via INTRAVENOUS
  Filled 2015-12-09: qty 10

## 2015-12-16 ENCOUNTER — Inpatient Hospital Stay: Payer: 59

## 2015-12-16 VITALS — BP 119/73 | HR 78 | Temp 96.6°F | Resp 18

## 2015-12-16 DIAGNOSIS — D509 Iron deficiency anemia, unspecified: Secondary | ICD-10-CM | POA: Diagnosis not present

## 2015-12-16 MED ORDER — SODIUM CHLORIDE 0.9 % IV SOLN
Freq: Once | INTRAVENOUS | Status: AC
  Administered 2015-12-16: 14:00:00 via INTRAVENOUS
  Filled 2015-12-16: qty 1000

## 2015-12-16 MED ORDER — SODIUM CHLORIDE 0.9 % IV SOLN
200.0000 mg | Freq: Once | INTRAVENOUS | Status: AC
  Administered 2015-12-16: 200 mg via INTRAVENOUS
  Filled 2015-12-16: qty 10

## 2015-12-19 ENCOUNTER — Other Ambulatory Visit: Payer: Self-pay

## 2015-12-19 MED ORDER — CELECOXIB 200 MG PO CAPS: 200.0000 mg | ORAL_CAPSULE | Freq: Two times a day (BID) | ORAL | 1 refills | Status: DC

## 2015-12-23 ENCOUNTER — Inpatient Hospital Stay: Payer: 59

## 2015-12-23 VITALS — BP 121/75 | HR 82 | Temp 97.1°F

## 2015-12-23 DIAGNOSIS — D509 Iron deficiency anemia, unspecified: Secondary | ICD-10-CM

## 2015-12-23 MED ORDER — SODIUM CHLORIDE 0.9 % IV SOLN
Freq: Once | INTRAVENOUS | Status: AC
  Administered 2015-12-23: 14:00:00 via INTRAVENOUS
  Filled 2015-12-23: qty 1000

## 2015-12-23 MED ORDER — SODIUM CHLORIDE 0.9 % IV SOLN
200.0000 mg | Freq: Once | INTRAVENOUS | Status: AC
  Administered 2015-12-23: 200 mg via INTRAVENOUS
  Filled 2015-12-23: qty 10

## 2016-01-06 ENCOUNTER — Other Ambulatory Visit: Payer: 59

## 2016-01-06 ENCOUNTER — Ambulatory Visit: Payer: 59 | Admitting: Internal Medicine

## 2016-01-13 ENCOUNTER — Inpatient Hospital Stay: Payer: 59

## 2016-01-13 ENCOUNTER — Inpatient Hospital Stay: Payer: 59 | Admitting: Internal Medicine

## 2016-02-02 ENCOUNTER — Inpatient Hospital Stay (HOSPITAL_BASED_OUTPATIENT_CLINIC_OR_DEPARTMENT_OTHER): Payer: 59 | Admitting: Internal Medicine

## 2016-02-02 ENCOUNTER — Encounter: Payer: Self-pay | Admitting: Internal Medicine

## 2016-02-02 ENCOUNTER — Other Ambulatory Visit: Payer: Self-pay

## 2016-02-02 ENCOUNTER — Inpatient Hospital Stay: Payer: 59 | Attending: Internal Medicine

## 2016-02-02 DIAGNOSIS — I499 Cardiac arrhythmia, unspecified: Secondary | ICD-10-CM | POA: Diagnosis not present

## 2016-02-02 DIAGNOSIS — I728 Aneurysm of other specified arteries: Secondary | ICD-10-CM | POA: Insufficient documentation

## 2016-02-02 DIAGNOSIS — M129 Arthropathy, unspecified: Secondary | ICD-10-CM | POA: Insufficient documentation

## 2016-02-02 DIAGNOSIS — E079 Disorder of thyroid, unspecified: Secondary | ICD-10-CM | POA: Insufficient documentation

## 2016-02-02 DIAGNOSIS — R002 Palpitations: Secondary | ICD-10-CM | POA: Diagnosis not present

## 2016-02-02 DIAGNOSIS — Z87891 Personal history of nicotine dependence: Secondary | ICD-10-CM

## 2016-02-02 DIAGNOSIS — D509 Iron deficiency anemia, unspecified: Secondary | ICD-10-CM

## 2016-02-02 DIAGNOSIS — I447 Left bundle-branch block, unspecified: Secondary | ICD-10-CM | POA: Diagnosis not present

## 2016-02-02 DIAGNOSIS — D561 Beta thalassemia: Secondary | ICD-10-CM | POA: Diagnosis not present

## 2016-02-02 DIAGNOSIS — Z87442 Personal history of urinary calculi: Secondary | ICD-10-CM | POA: Diagnosis not present

## 2016-02-02 DIAGNOSIS — Z79899 Other long term (current) drug therapy: Secondary | ICD-10-CM | POA: Insufficient documentation

## 2016-02-02 DIAGNOSIS — D560 Alpha thalassemia: Secondary | ICD-10-CM

## 2016-02-02 DIAGNOSIS — R918 Other nonspecific abnormal finding of lung field: Secondary | ICD-10-CM

## 2016-02-02 DIAGNOSIS — Z8742 Personal history of other diseases of the female genital tract: Secondary | ICD-10-CM

## 2016-02-02 LAB — CBC WITH DIFFERENTIAL/PLATELET
Basophils Absolute: 0 10*3/uL (ref 0–0.1)
Basophils Relative: 0 %
Eosinophils Absolute: 0.2 10*3/uL (ref 0–0.7)
Eosinophils Relative: 2 %
HCT: 29 % — ABNORMAL LOW (ref 35.0–47.0)
Hemoglobin: 9.4 g/dL — ABNORMAL LOW (ref 12.0–16.0)
Lymphocytes Relative: 30 %
Lymphs Abs: 3.4 10*3/uL (ref 1.0–3.6)
MCH: 22.2 pg — ABNORMAL LOW (ref 26.0–34.0)
MCHC: 32.5 g/dL (ref 32.0–36.0)
MCV: 68.3 fL — ABNORMAL LOW (ref 80.0–100.0)
Monocytes Absolute: 0.9 10*3/uL (ref 0.2–0.9)
Monocytes Relative: 8 %
Neutro Abs: 6.6 10*3/uL — ABNORMAL HIGH (ref 1.4–6.5)
Neutrophils Relative %: 60 %
Platelets: 255 10*3/uL (ref 150–440)
RBC: 4.25 MIL/uL (ref 3.80–5.20)
RDW: 17.3 % — ABNORMAL HIGH (ref 11.5–14.5)
WBC: 11.2 10*3/uL — ABNORMAL HIGH (ref 3.6–11.0)

## 2016-02-02 LAB — IRON AND TIBC
Iron: 91 ug/dL (ref 28–170)
Saturation Ratios: 30 % (ref 10.4–31.8)
TIBC: 305 ug/dL (ref 250–450)
UIBC: 214 ug/dL

## 2016-02-02 LAB — FERRITIN: Ferritin: 241 ng/mL (ref 11–307)

## 2016-02-02 NOTE — Progress Notes (Signed)
[facility] CONSULT NOTE  Patient Care Team: Sherlene Shams, MD as PCP - General (Internal Medicine) Earline Mayotte, MD as Consulting Physician (General Surgery)  CHIEF COMPLAINTS/PURPOSE OF CONSULTATION:    HEMATOLOGY HISTORY  # CHRONIC MICROCYTIC ANEMIA- BETA THALASSEMIA MINOR [colo- 2016; Dr. Fanny Skates July 2017- IV trial of Venofer x4   # Lung nodules [CT- sub cm Jan 2017]  HISTORY OF PRESENTING ILLNESS:  Cheryl [name] 60 y.o.  female with a history of beta thalassemia minor; also iron deficiency anemia- status post IV iron in July 2017 is here for follow-up.  Patient energy levels improved after IV iron. However she notes to have more fatigue in the last few weeks. Denies any weight loss. Denies any Difficult to swallowing or constipation or diarrhea. Denies any night sweats. Continues to complain of significant palpitations.  No nausea no vomiting no blood in stools.   ROS: A complete 10 point review of system is done which is negative except mentioned above in history of present illness  MEDICAL HISTORY:  Past Medical History:  Diagnosis Date  . 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 Arrhythmia    left bundle branch block  . Arthritis   . Blood in stool   . Chicken pox   . Generalized headaches   . History of blood transfusion   . LBBB (left bundle branch block)   . Thyroid disease   . UTI (lower urinary tract infection)     SURGICAL HISTORY: Past Surgical History:  Procedure Laterality Date  . ABDOMINAL HYSTERECTOMY  1997  . APPENDECTOMY  1981  . BREAST BIOPSY Bilateral    neg  . BREAST SURGERY  1976  . CARDIAC CATHETERIZATION  2004   [facility]  . CARDIAC CATHETERIZATION  2006   [facility]  . cystic fibrosis tumor removal  1983  . THUMB AMPUTATION  1992   traumatic  . TONSILLECTOMY AND ADENOIDECTOMY  1964     SOCIAL HISTORY: Social History   Social History  . Marital status: Married    Spouse name: N/A  . Number of children: N/A  . Years of education: N/A   Occupational History  . Not on file.   Social History Main Topics  . Smoking status: Former Smoker    Packs/day: 0.25    Years: 34.00    Types: Cigarettes    Quit date: 04/10/2013  . Smokeless tobacco: Never Used  . Alcohol use Yes     Comment: occasional  . Drug use: No  . Sexual activity: Not on file   Other Topics Concern  . Not on file   Social History Narrative   Married to [employer]   Works for [employer], Engineer, structural    FAMILY HISTORY: Family History  Problem Relation Age of Onset  . Heart disease Mother   . Arthritis Mother   . Cancer Mother     breast  . Hyperlipidemia Mother   . Hypertension Mother   . Heart attack Mother   . Breast cancer Mother 62  . Heart disease Father   . Cancer Father     hodgkins disease, prostate  . Diabetes Father   . Arthritis Father   . Hypertension Father   . Heart disease Sister   . Diabetes Sister   . Heart disease Brother 21    ami x 8,  4 vessel  CABG   . Diabetes Brother   . Liver disease Brother   . Lung disease Brother   . Heart disease Maternal Grandmother   . Diabetes Maternal Grandmother   . Heart disease Maternal Grandfather   . Heart disease Paternal Grandmother   . Diabetes Paternal Grandmother   . Stroke Paternal Grandfather   . Heart disease Paternal Grandfather   . Diabetes Paternal Grandfather   . Diabetes Maternal Aunt     ALLERGIES:  is allergic to peanuts [peanut oil]; penicillins; sulfa antibiotics; influenza vac split [flu virus vaccine]; mobic [meloxicam]; and nickel.  MEDICATIONS:  Current Outpatient Prescriptions  Medication Sig Dispense Refill  . buPROPion (WELLBUTRIN SR) 100 MG 12 hr tablet Take 1 tablet by mouth two  times daily 180 tablet 2  . Calcium Carbonate-Vit D-Min (CALCIUM 1200 PO) Take 1 tablet by mouth  daily.    . celecoxib (CELEBREX) 200 MG capsule Take 1 capsule (200 mg total) by mouth 2 (two) times daily. 60 capsule 1  . Cholecalciferol (VITAMIN D PO) Take by mouth. Pt takes 5000iu daily    . CINNAMON PO Take 2,000 mg by mouth daily.    . clobetasol ointment (TEMOVATE) 0.05 % Apply 1 application topically 2 (two) times daily. Until resolved (Patient taking differently: Apply 1 application topically daily. Until resolved) 30 g 2  . Cyanocobalamin (VITAMIN B 12 PO) Take by mouth.    . Diclofenac Sodium 1.5 % SOLN Apply to painful joint twice daily 150 mL 3  . Docusate Calcium (STOOL SOFTENER PO) Take by mouth.    . EPINEPHrine (EPIPEN) 0.3 mg/0.3 mL DEVI Inject 0.3 mLs (0.3 mg total) into the muscle once. (Patient taking differently: Inject 0.3 mg into the muscle as needed. ) 1 Device 5  . Multiple Vitamin (MULTIVITAMIN) tablet Take 1 tablet by mouth daily.    . Omega-3 Fatty Acids (FISH OIL) 1000 MG CAPS Take 1 capsule by mouth daily.    . progesterone (PROMETRIUM) 200 MG capsule Take 200 mg by mouth daily.    . propranolol (INDERAL) 10 MG tablet Take 1 tablet (10 mg total) by mouth 3 (three) times daily. 21 tablet 6  . traMADol (ULTRAM) 50 MG tablet Take 1 tablet (50 mg total) by mouth every 8 (eight) hours as needed. 90 tablet 2  . vitamin C (ASCORBIC ACID) 500 MG tablet Take 500 mg by mouth daily.    . Vitamin K, Phytonadione, 100 MCG TABS Take by mouth.    . cyclobenzaprine (FLEXERIL) 5 MG tablet 1-2 TABLETS AT BEDTIME, CAN BE INCREASED TO TWICE A DAY AS NEEDED FOR MUSCLE SPASM  2  . hydrOXYzine (ATARAX/VISTARIL) 25 MG tablet TAKE 1 TABLET (25 MG TOTAL) BY MOUTH 3 (THREE) TIMES DAILY AS NEEDED FOR ITCHING. (Patient not taking: Reported on 02/02/2016) 90 tablet 3  . Lactulose 20 GM/30ML SOLN 30 ml every 4 hours until constipation is relieved (Patient not taking: Reported on 02/02/2016) 236 mL 3  . nitroGLYCERIN (NITROSTAT) 0.4 MG SL tablet Place 1 tablet (0.4 mg total) under the tongue every 5  (five) minutes as needed for chest pain. (Patient not taking: Reported on 02/02/2016) 25 tablet 3   No current facility-administered medications for this visit.       [name]  PHYSICAL EXAMINATION:   Vitals:   02/02/16 1559  BP: (!) 146/81  Pulse: 98  Resp: 17  Temp: 97.6 F (36.4 C)   Filed Weights   02/02/16 1559  Weight: 164 lb (74.4 kg)  GENERAL: Well-nourished well-developed; Alert, no distress and comfortable.  Alone.  EYES: no pallor or icterus OROPHARYNX: no thrush or ulceration; good dentition  NECK: supple, no masses felt LYMPH:  no palpable lymphadenopathy in the cervical, axillary or inguinal regions LUNGS: clear to auscultation and  No wheeze or crackles HEART/CVS: regular rate & rhythm and no murmurs; No lower extremity edema ABDOMEN: abdomen soft, non-tender and normal bowel sounds Musculoskeletal:no cyanosis of digits and no clubbing  PSYCH: alert & oriented x 3 with fluent speech NEURO: no focal motor/sensory deficits SKIN:  no rashes or significant lesions  LABORATORY DATA:  I have reviewed the data as listed Lab Results  Component Value Date   WBC 11.2 (H) 02/02/2016   HGB 9.4 (L) 02/02/2016   HCT 29.0 (L) 02/02/2016   MCV 68.3 (L) 02/02/2016   PLT 255 02/02/2016    Recent Labs  03/21/15  05/19/15 1435 08/17/15 0759 10/26/15 0855 11/15/15 1538  NA 137  --  137  --  139 136  K 4.6  --  3.8  --  4.4 4.0  CL  --   --  105  --  102 106  CO2  --   --  26  --  17* 23  GLUCOSE  --   --  79  --  118* 97  BUN 11  --  13  --  13 18  CREATININE 0.9  < > 0.84 0.80 0.97 0.85  CALCIUM  --   --  9.3  --  9.5 9.8  GFRNONAA  --   --   --   --  64 >60  GFRAA  --   --   --   --  73 >60  PROT  --   --   --   --  6.5 7.2  ALBUMIN  --   --   --   --  4.4 4.4  AST 28  --   --   --  21 18  ALT 33  --   --   --  20 17  ALKPHOS 74  --   --   --  70 62  BILITOT  --   --   --   --  0.8 1.1  < > = values in this interval not displayed.   No results  found.  ASSESSMENT & PLAN:   Iron deficiency anemia # Chronic microcytic anemia/history of beta thalassemia - out of proportion to her anemia. S/p IV venoferx4- improved symptoms. Hb today 9.5;  awaiting for labs from today; if low recommend IV iron again.   # work number- [phone] [work]- will call  # Lung nodules- monitor for now.   # recheck labs in 4 months- 1 week prior; venofer IV possible.    Earna CoderGovinda R Brahmanday, MD 02/02/2016 4:41 PM

## 2016-02-02 NOTE — Assessment & Plan Note (Addendum)
#   Chronic microcytic anemia/history of beta thalassemia - out of proportion to her anemia. S/p IV venoferx4- improved symptoms. Hb today 9.5;  awaiting for labs from today; if low recommend IV iron again.   # work number- [phone] [work]- will call  # Lung nodules- monitor for now; recommend CT in Feb 2018.   # recheck labs in 4 months- 1 week prior; venofer IV possible.

## 2016-02-10 ENCOUNTER — Other Ambulatory Visit: Payer: Self-pay | Admitting: Internal Medicine

## 2016-02-10 ENCOUNTER — Inpatient Hospital Stay: Payer: 59

## 2016-02-10 ENCOUNTER — Telehealth: Payer: Self-pay | Admitting: *Deleted

## 2016-02-10 VITALS — BP 130/84 | HR 99 | Temp 97.7°F | Resp 20

## 2016-02-10 DIAGNOSIS — D509 Iron deficiency anemia, unspecified: Secondary | ICD-10-CM

## 2016-02-10 MED ORDER — SODIUM CHLORIDE 0.9 % IV SOLN
Freq: Once | INTRAVENOUS | Status: AC
  Administered 2016-02-10: 15:00:00 via INTRAVENOUS
  Filled 2016-02-10: qty 1000

## 2016-02-10 MED ORDER — SODIUM CHLORIDE 0.9 % IV SOLN
200.0000 mg | Freq: Once | INTRAVENOUS | Status: AC
  Administered 2016-02-10: 200 mg via INTRAVENOUS
  Filled 2016-02-10: qty 10

## 2016-02-10 NOTE — Telephone Encounter (Signed)
Medication has already been ordered

## 2016-02-10 NOTE — Telephone Encounter (Signed)
Patient requeted a medication refill for Celebrex  Pharmacy [facility]

## 2016-02-13 ENCOUNTER — Telehealth: Payer: Self-pay | Admitting: Internal Medicine

## 2016-02-13 DIAGNOSIS — Z1239 Encounter for other screening for malignant neoplasm of breast: Secondary | ICD-10-CM

## 2016-02-13 NOTE — Telephone Encounter (Signed)
Orders in 

## 2016-02-13 NOTE — Telephone Encounter (Signed)
Pt lvm stating that she needs [facility]. No order in chart.

## 2016-02-13 NOTE — Telephone Encounter (Signed)
Can she have just a diagnostic? Her one in 2015 was more?

## 2016-02-21 ENCOUNTER — Ambulatory Visit: Payer: 59

## 2016-03-02 ENCOUNTER — Ambulatory Visit
Admission: RE | Admit: 2016-03-02 | Discharge: 2016-03-02 | Disposition: A | Discharge status: Home / Self Care | Payer: 59 | Source: Ambulatory Visit | Attending: Internal Medicine | Admitting: Internal Medicine

## 2016-03-02 DIAGNOSIS — R928 Other abnormal and inconclusive findings on diagnostic imaging of breast: Secondary | ICD-10-CM | POA: Diagnosis not present

## 2016-03-02 DIAGNOSIS — Z1239 Encounter for other screening for malignant neoplasm of breast: Secondary | ICD-10-CM

## 2016-03-02 DIAGNOSIS — Z1231 Encounter for screening mammogram for malignant neoplasm of breast: Secondary | ICD-10-CM | POA: Insufficient documentation

## 2016-03-03 ENCOUNTER — Other Ambulatory Visit: Payer: Self-pay | Admitting: Internal Medicine

## 2016-03-03 DIAGNOSIS — R928 Other abnormal and inconclusive findings on diagnostic imaging of breast: Secondary | ICD-10-CM

## 2016-03-05 ENCOUNTER — Other Ambulatory Visit: Payer: Self-pay | Admitting: Internal Medicine

## 2016-03-05 DIAGNOSIS — N6310 Unspecified lump in the right breast, unspecified quadrant: Secondary | ICD-10-CM

## 2016-03-06 ENCOUNTER — Encounter: Payer: Self-pay | Admitting: *Deleted

## 2016-03-19 ENCOUNTER — Encounter: Payer: Self-pay | Admitting: Family Medicine

## 2016-03-19 ENCOUNTER — Ambulatory Visit (INDEPENDENT_AMBULATORY_CARE_PROVIDER_SITE_OTHER): Payer: 59 | Admitting: Family Medicine

## 2016-03-19 ENCOUNTER — Ambulatory Visit: Payer: Self-pay

## 2016-03-19 VITALS — BP 136/72 | HR 85 | Wt 163.0 lb

## 2016-03-19 DIAGNOSIS — M25512 Pain in left shoulder: Secondary | ICD-10-CM | POA: Diagnosis not present

## 2016-03-19 DIAGNOSIS — M7552 Bursitis of left shoulder: Secondary | ICD-10-CM

## 2016-03-19 NOTE — Assessment & Plan Note (Signed)
Patient given injection today and tolerated the procedure well. We discussed icing regimen and home exercises. We discussed which activities to do a which ones to avoid. Patient will continue to be active. Follow-up with me again in 4 weeks.

## 2016-03-19 NOTE — Patient Instructions (Signed)
Good to see you.  Ice 20 minutes 2 times daily. Usually after activity and before bed. Exercises 3 times a week.  pennsaid pinkie amount topically 2 times daily as needed.  Avoid over head activity  Move the printer Make an appointment for 4 weeks from now. If not better we will consider pT or totherwise see me when you need me.

## 2016-03-19 NOTE — Progress Notes (Signed)
[name] [address] [place], [place] [address] Phone: [phone] Subjective:     CC: Left shoulder pain f/u  GEX:[id number]  Cheryl [name] is a 60 y.o. female coming in with complaint of left shoulder pain. Patient has had this pain for multiple months if not years. Patient did have a fall on her left wrist and did have a fracture. Patient also had an MRI of her shoulder 2 years ago that showed some tendinopathy but that was fairly unremarkable.   Seen 19 months ago. Patient was given an injection at that time. Patient states Pain seems to be worsening again. Patient has been working a significant amount more intense since then she has been doing more repetitive activity. Describes pain as a dull, throbbing aching sensation. Patient states that he can stop her from certain activities. Pain is worse at night as well. Denies any numbness but states that it does radiate down the upper part of her arm somewhat. Rates the severity of pain a 6 out of 10.      Past Medical History:  Diagnosis Date  . 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 Arrhythmia    left bundle branch block  . Arthritis   . Blood in stool   . Chicken pox   . Generalized headaches   . History of blood transfusion   . LBBB (left bundle branch block)   . Thyroid disease   . UTI (lower urinary tract infection)    Past Surgical History:  Procedure Laterality Date  . ABDOMINAL HYSTERECTOMY  1997  . APPENDECTOMY  1981  . BREAST BIOPSY Bilateral 1976   neg  . BREAST SURGERY  1976  . CARDIAC CATHETERIZATION  2004   [facility]  . CARDIAC CATHETERIZATION  2006   [facility]  . cystic fibrosis tumor removal  1983  . THUMB AMPUTATION  1992   traumatic  . TONSILLECTOMY AND ADENOIDECTOMY  1964   Social History  Substance Use Topics  . Smoking status: Former  Smoker    Packs/day: 0.25    Years: 34.00    Types: Cigarettes    Quit date: 04/10/2013  . Smokeless tobacco: Never Used  . Alcohol use Yes     Comment: occasional   Allergies  Allergen Reactions  . Peanuts [Peanut Oil]   . Penicillins Other (See Comments)    Hives,rash,nausea,swelling,   . Sulfa Antibiotics Other (See Comments)    Hives,rash,nausea,swelling  . Influenza Vac Split [Flu Virus Vaccine]     Pleurisy  1996  . Mobic [Meloxicam] Nausea Only  . Nickel    Family History  Problem Relation Age of Onset  . Heart disease Mother   . Arthritis Mother   . Cancer Mother     breast  . Hyperlipidemia Mother   . Hypertension Mother   . Heart attack Mother   . Breast cancer Mother 48  . Heart disease Father   . Cancer Father     hodgkins disease, prostate  . Diabetes Father   . Arthritis Father   . Hypertension Father   . Heart disease Sister   . Diabetes Sister   . Heart disease Brother 23    ami x 8,  4 vessel CABG   . Diabetes Brother   . Liver disease Brother   . Lung disease Brother   . Heart disease  Maternal Grandmother   . Diabetes Maternal Grandmother   . Heart disease Maternal Grandfather   . Heart disease Paternal Grandmother   . Diabetes Paternal Grandmother   . Stroke Paternal Grandfather   . Heart disease Paternal Grandfather   . Diabetes Paternal Grandfather   . Diabetes Maternal Aunt      Past medical history, social, surgical and family history all reviewed in electronic medical record.   Review of Systems: No headache, visual changes, nausea, vomiting, diarrhea, constipation, dizziness, abdominal pain, skin rash, fevers, chills, night sweats, weight loss, swollen lymph nodes, body aches, joint swelling, muscle aches, chest pain, shortness of breath, mood changes.   Objective  Blood pressure 136/72, pulse 85, weight 163 lb (73.9 kg), SpO2 97 %.  General: No apparent distress alert and oriented x3 mood and affect normal, dressed appropriately.   HEENT: Pupils equal, extraocular movements intact  Respiratory: Patient's speak in full sentences and does not appear short of breath  Cardiovascular: No lower extremity edema, non tender, no erythema  Skin: Warm dry intact with no signs of infection or rash on extremities or on axial skeleton.  Abdomen: Soft nontender  Neuro: Cranial nerves II through XII are intact, neurovascularly intact in all extremities with 2+ DTRs and 2+ pulses.  Lymph: No lymphadenopathy of posterior or anterior cervical chain or axillae bilaterally.  Gait normal with good balance and coordination.  MSK:  Non tender with full range of motion and good stability and symmetric strength and tone of elbows, wrist, hip, knee and ankles bilaterally.  Shoulder: left Inspection reveals no abnormalities, atrophy or asymmetry. Palpation is normal with no tenderness over AC joint or bicipital groove. ROM is full in all planes passively. Rotator cuff strength normal throughout. signs of impingement with positive Neer and Hawkin's tests, but negative empty can sign. Speeds and Yergason's tests normal. No labral pathology noted with negative Obrien's, negative clunk and good stability. Normal scapular function observed. No painful arc and no drop arm sign. No apprehension sign  MSK US performed of: left This study was ordered, performed, and interpreted by Terrilee FilesZach Teri Legacy D.O.  Shoulder:   Supraspinatus:  Appears normal on long and transverse views, Bursal bulge seen with shoulder abduction on impingement view. Infraspinatus:  Appears normal on long and transverse views. Significant increase in Doppler flow Subscapularis:  Appears normal on long and transverse views. Positive bursa Teres Minor:  Appears normal on long and transverse views. AC joint:  Capsule undistended, no geyser sign. Glenohumeral Joint:  Appears normal without effusion. Glenoid Labrum:  Intact without visualized tears. Biceps Tendon:  Appears normal on long  and transverse views, no fraying of tendon, tendon located in intertubercular groove, no subluxation with shoulder internal or external rotation.  Impression: Subacromial bursitis  Procedure: Real-time Ultrasound Guided Injection of left glenohumeral joint Device: GE Logiq E  Ultrasound guided injection is preferred based studies that show increased duration, increased effect, greater accuracy, decreased procedural pain, increased response rate with ultrasound guided versus blind injection.  Verbal informed consent obtained.  Time-out conducted.  Noted no overlying erythema, induration, or other signs of local infection.  Skin prepped in a sterile fashion.  Local anesthesia: Topical Ethyl chloride.  With sterile technique and under real time ultrasound guidance:  Joint visualized.  23g 1  inch needle inserted posterior approach. Pictures taken for needle placement. Patient did have injection of 2 cc of 1% lidocaine, 2 cc of 0.5% Marcaine, and 1.0 cc of Kenalog 40 mg/dL. Completed without difficulty  Pain immediately resolved suggesting accurate placement of the medication.  Advised to call if fevers/chills, erythema, induration, drainage, or persistent bleeding.  Images permanently stored and available for review in the ultrasound unit.  Impression: Technically successful ultrasound guided injection.   Procedure note 97110; 15 minutes spent for Therapeutic exercises as stated in above notes.  This included exercises focusing on stretching, strengthening, with significant focus on eccentric aspects. Shoulder Exercises that included:  Basic scapular stabilization to include adduction and depression of scapula Scaption, focusing on proper movement and good control Internal and External rotation utilizing a theraband, with elbow tucked at side entire time Rows with theraband    Proper technique shown and discussed handout in great detail with ATC.  All questions were discussed and answered.         Impression and Recommendations:     This case required medical decision making of moderate complexity.

## 2016-03-21 ENCOUNTER — Ambulatory Visit
Admission: RE | Admit: 2016-03-21 | Discharge: 2016-03-21 | Disposition: A | Discharge status: Home / Self Care | Payer: 59 | Source: Ambulatory Visit | Attending: Internal Medicine | Admitting: Internal Medicine

## 2016-03-21 ENCOUNTER — Other Ambulatory Visit: Payer: Self-pay | Admitting: Internal Medicine

## 2016-03-21 DIAGNOSIS — N6310 Unspecified lump in the right breast, unspecified quadrant: Secondary | ICD-10-CM | POA: Diagnosis not present

## 2016-03-21 NOTE — Telephone Encounter (Signed)
Refilled.  Will need 6 month follow up

## 2016-03-21 NOTE — Telephone Encounter (Signed)
Last filled 02/10/16. Last seen 12/02/15. No follow up visit on file.

## 2016-03-26 ENCOUNTER — Telehealth: Payer: Self-pay | Admitting: Family Medicine

## 2016-03-26 NOTE — Telephone Encounter (Signed)
Patient is calling to advisethat she is having a reaction to the injection given last week. She was unable to move her arm until Thursday. She is having spasms and cramps.

## 2016-03-26 NOTE — Telephone Encounter (Signed)
Sounds like she had a flare.  Likely should continue to improve at this time.

## 2016-03-27 ENCOUNTER — Telehealth: Payer: Self-pay | Admitting: *Deleted

## 2016-03-27 ENCOUNTER — Encounter: Payer: Self-pay | Admitting: Family

## 2016-03-27 ENCOUNTER — Ambulatory Visit (INDEPENDENT_AMBULATORY_CARE_PROVIDER_SITE_OTHER): Payer: 59 | Admitting: Family

## 2016-03-27 VITALS — BP 120/85 | HR 95 | Temp 98.4°F | Ht 63.0 in | Wt 160.2 lb

## 2016-03-27 DIAGNOSIS — J209 Acute bronchitis, unspecified: Secondary | ICD-10-CM

## 2016-03-27 DIAGNOSIS — R252 Cramp and spasm: Secondary | ICD-10-CM | POA: Diagnosis not present

## 2016-03-27 MED ORDER — BENZONATATE 100 MG PO CAPS: 100.0000 mg | ORAL_CAPSULE | Freq: Two times a day (BID) | ORAL | 0 refills | Status: DC | PRN

## 2016-03-27 MED ORDER — DEXTROMETHORPHAN-GUAIFENESIN 5-100 MG/5ML PO LIQD: 10.0000 mL | Freq: Every evening | ORAL | 0 refills | Status: DC | PRN

## 2016-03-27 NOTE — Telephone Encounter (Signed)
Patient requested a medication refill for tramadol  Pharmacy [facility] in [place]

## 2016-03-27 NOTE — Progress Notes (Signed)
Subjective:    Patient ID: Cheryl LaineKathryn Ann Crittendon, female    DOB: 09/16/1955, 60 y.o.   MRN: [id number]  CC: Cheryl LaineKathryn Ann Matsuoka is a 60 y.o. female who presents today for an acute visit.    HPI: Patient here for acute visit with chief complaint of sinus congestion and dry cough for 4 days ago Has been OTC sinus medication and benadryl. Symptoms onset started with shot for left shoulder bursitis one week ago. Injection was done with Dr. Katrinka BlazingSmith and started taking benadryl which helped with shoulder pain and 'thought I had reaction to shot.' 5 days ago started having muscle cramps in hands and legs which is improving.  No wheezing, SOB, ear pain, HA, vision changes. Endorses sore throat which has improved. Denies exertional chest pain or pressure, numbness or tingling radiating to left arm or jaw, palpitations, dizziness, frequent headaches, changes in vision, or shortness of breath.     H/o OSA.  No h/o lung disease.       HISTORY:  Past Medical History:  Diagnosis Date  . 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 Arrhythmia    left bundle branch block  . Arthritis   . Blood in stool   . Chicken pox   . Generalized headaches   . History of blood transfusion   . LBBB (left bundle branch block)   . Thyroid disease   . UTI (lower urinary tract infection)    Past Surgical History:  Procedure Laterality Date  . ABDOMINAL HYSTERECTOMY  1997  . APPENDECTOMY  1981  . BREAST BIOPSY Bilateral 1976   neg  . BREAST SURGERY  1976  . CARDIAC CATHETERIZATION  2004   [facility]  . CARDIAC CATHETERIZATION  2006   [facility]  . cystic fibrosis tumor removal  1983  . THUMB AMPUTATION  1992   traumatic  . TONSILLECTOMY AND ADENOIDECTOMY  1964   Family History  Problem Relation Age of Onset  . Heart disease Mother   . Arthritis Mother   . Cancer Mother     breast  .  Hyperlipidemia Mother   . Hypertension Mother   . Heart attack Mother   . Breast cancer Mother [id number]  . Heart disease Father   . Cancer Father     hodgkins disease, prostate  . Diabetes Father   . Arthritis Father   . Hypertension Father   . Heart disease Sister   . Diabetes Sister   . Heart disease Brother [id number]    ami x 8,  4 vessel CABG   . Diabetes Brother   . Liver disease Brother   . Lung disease Brother   . Heart disease Maternal Grandmother   . Diabetes Maternal Grandmother   . Heart disease Maternal Grandfather   . Heart disease Paternal Grandmother   . Diabetes Paternal Grandmother   . Stroke Paternal Grandfather   . Heart disease Paternal Grandfather   . Diabetes Paternal Grandfather   . Diabetes Maternal Aunt     Allergies: Peanuts [peanut oil]; Penicillins; Sulfa antibiotics; Influenza vac split [flu virus vaccine]; Mobic [meloxicam]; and Nickel Current Outpatient Prescriptions on File Prior to Visit  Medication Sig Dispense Refill  . buPROPion (WELLBUTRIN SR) 100 MG 12 hr tablet Take 1 tablet by mouth two  times daily 180 tablet 2  . Calcium Carbonate-Vit D-Min (CALCIUM 1200 PO) Take 1 tablet by mouth  daily.    . celecoxib (CELEBREX) 200 MG capsule TAKE 1 CAPSULE (200 MG TOTAL) BY MOUTH 2 (TWO) TIMES DAILY. 60 capsule 1  . Cholecalciferol (VITAMIN D PO) Take by mouth. Pt takes 5000iu daily    . CINNAMON PO Take 2,000 mg by mouth daily.    . clobetasol ointment (TEMOVATE) 0.05 % Apply 1 application topically 2 (two) times daily. Until resolved (Patient taking differently: Apply 1 application topically daily. Until resolved) 30 g 2  . Cyanocobalamin (VITAMIN B 12 PO) Take by mouth.    . cyclobenzaprine (FLEXERIL) 5 MG tablet 1-2 TABLETS AT BEDTIME, CAN BE INCREASED TO TWICE A DAY AS NEEDED FOR MUSCLE SPASM  2  . Diclofenac Sodium 1.5 % SOLN Apply to painful joint twice daily 150 mL 3  . Docusate Calcium (STOOL SOFTENER PO) Take by mouth.    . EPINEPHrine (EPIPEN) 0.3  mg/0.3 mL DEVI Inject 0.3 mLs (0.3 mg total) into the muscle once. (Patient taking differently: Inject 0.3 mg into the muscle as needed. ) 1 Device 5  . hydrOXYzine (ATARAX/VISTARIL) 25 MG tablet TAKE 1 TABLET (25 MG TOTAL) BY MOUTH 3 (THREE) TIMES DAILY AS NEEDED FOR ITCHING. 90 tablet 3  . Lactulose 20 GM/30ML SOLN 30 ml every 4 hours until constipation is relieved 236 mL 3  . Multiple Vitamin (MULTIVITAMIN) tablet Take 1 tablet by mouth daily.    . nitroGLYCERIN (NITROSTAT) 0.4 MG SL tablet Place 1 tablet (0.4 mg total) under the tongue every 5 (five) minutes as needed for chest pain. 25 tablet 3  . Omega-3 Fatty Acids (FISH OIL) 1000 MG CAPS Take 1 capsule by mouth daily.    . progesterone (PROMETRIUM) 200 MG capsule Take 200 mg by mouth daily.    . propranolol (INDERAL) 10 MG tablet Take 1 tablet (10 mg total) by mouth 3 (three) times daily. 21 tablet 6  . traMADol (ULTRAM) 50 MG tablet TAKE 1 TABLET BY MOUTH EVERY 8 HOURS AS NEEDED 90 tablet 2  . vitamin C (ASCORBIC ACID) 500 MG tablet Take 500 mg by mouth daily.    . Vitamin K, Phytonadione, 100 MCG TABS Take by mouth.     No current facility-administered medications on file prior to visit.     Social History  Substance Use Topics  . Smoking status: Former Smoker    Packs/day: 0.25    Years: 34.00    Types: Cigarettes    Quit date: 04/10/2013  . Smokeless tobacco: Never Used  . Alcohol use Yes     Comment: occasional    Review of Systems  Constitutional: Negative for chills and fever.  HENT: Positive for congestion and sore throat. Negative for ear pain and sinus pressure.   Eyes: Negative for visual disturbance.  Respiratory: Positive for cough. Negative for shortness of breath and wheezing.   Cardiovascular: Negative for chest pain and palpitations.  Gastrointestinal: Negative for abdominal distention, abdominal pain, nausea and vomiting.  Musculoskeletal: Positive for myalgias.      Objective:    BP 120/85   Pulse 95    Temp 98.4 F (36.9 C) (Oral)   Ht 5\' 3"  (1.6 m)   Wt 160 lb 3.2 oz (72.7 kg)   SpO2 98%   BMI 28.38 kg/m    Physical Exam  Constitutional: She appears well-developed and well-nourished.  HENT:  Head: Normocephalic and atraumatic.  Right Ear: Hearing, tympanic membrane, external ear and ear canal normal. No drainage, swelling or tenderness. No foreign bodies. Tympanic membrane  is not erythematous and not bulging. No middle ear effusion. No decreased hearing is noted.  Left Ear: Hearing, tympanic membrane, external ear and ear canal normal. No drainage, swelling or tenderness. No foreign bodies. Tympanic membrane is not erythematous and not bulging.  No middle ear effusion. No decreased hearing is noted.  Nose: Nose normal. No rhinorrhea. Right sinus exhibits no maxillary sinus tenderness and no frontal sinus tenderness. Left sinus exhibits no maxillary sinus tenderness and no frontal sinus tenderness.  Mouth/Throat: Uvula is midline, oropharynx is clear and moist and mucous membranes are normal. No oropharyngeal exudate, posterior oropharyngeal edema, posterior oropharyngeal erythema or tonsillar abscesses.  Eyes: Conjunctivae are normal.  Cardiovascular: Regular rhythm, normal heart sounds and normal pulses.   Pulmonary/Chest: Effort normal and breath sounds normal. She has no wheezes. She has no rhonchi. She has no rales.  Lymphadenopathy:       Head (right side): No submental, no submandibular, no tonsillar, no preauricular, no posterior auricular and no occipital adenopathy present.       Head (left side): No submental, no submandibular, no tonsillar, no preauricular, no posterior auricular and no occipital adenopathy present.    She has no cervical adenopathy.  Neurological: She is alert.  Skin: Skin is warm and dry.  Psychiatric: She has a normal mood and affect. Her speech is normal and behavior is normal. Thought content normal.  Vitals reviewed.      Assessment & Plan:  1.  Acute bronchitis, unspecified organism Afebrile. No adventitious lung sounds. Symptoms for 4 days. Patient and I jointly agreed  on delayed antibiotic therapy. We will treat symptomatically and she will let me know if she is not better. - benzonatate (TESSALON) 100 MG capsule; Take 1 capsule (100 mg total) by mouth 2 (two) times daily as needed for cough.  Dispense: 20 capsule; Refill: 0 - Dextromethorphan-Guaifenesin 5-100 MG/5ML LIQD; Take 10 mLs by mouth at bedtime as needed (for cough).  Dispense: 1 Bottle; Refill: 0  2. Muscle cramping Etiology unclear at this time. I'm reassured the cramps have improved over the past couple days. Patient is not on a statin or diuretic. She's not had any nausea or vomiting. Pending CMP to ensure no electrolyte abnormality. Return precautions given.  - Comprehensive metabolic panel     I am having Ms. Gebel start on benzonatate and Dextromethorphan-Guaifenesin. I am also having her maintain her Cholecalciferol (VITAMIN D PO), Calcium Carbonate-Vit D-Min (CALCIUM 1200 PO), CINNAMON PO, vitamin C, Fish Oil, multivitamin, progesterone, Docusate Calcium (STOOL SOFTENER PO), EPINEPHrine, Cyanocobalamin (VITAMIN B 12 PO), hydrOXYzine, clobetasol ointment, Diclofenac Sodium, nitroGLYCERIN, propranolol, buPROPion, Vitamin K (Phytonadione), Lactulose, cyclobenzaprine, celecoxib, and traMADol.   Meds ordered this encounter  Medications  . benzonatate (TESSALON) 100 MG capsule    Sig: Take 1 capsule (100 mg total) by mouth 2 (two) times daily as needed for cough.    Dispense:  20 capsule    Refill:  0    Order Specific Question:   Supervising Provider    Answer:   Duncan DullULLO, TERESA L [[id number]]  . Dextromethorphan-Guaifenesin 5-100 MG/5ML LIQD    Sig: Take 10 mLs by mouth at bedtime as needed (for cough).    Dispense:  1 Bottle    Refill:  0    Order Specific Question:   Supervising Provider    Answer:   Sherlene ShamsULLO, TERESA L [[id number]]    Return precautions given.    Risks, benefits, and alternatives of the medications and treatment plan prescribed today were  discussed, and patient expressed understanding.   Education regarding symptom management and diagnosis given to patient on AVS.  Continue to follow with TULLO, Mar Daring, MD for routine health maintenance.   Cheryl [name] and I agreed with plan.   Rennie Plowman, FNP

## 2016-03-27 NOTE — Patient Instructions (Addendum)
Tessalon perles are PRN for daytime.  Cough syrup is PRN for bedtime.   Increase intake of clear fluids. Congestion is best treated by hydration, when mucus is wetter, it is thinner, less sticky, and easier to expel from the body, either through coughing up drainage, or by blowing your nose.   Get plenty of rest.   Use saline nasal drops and blow your nose frequently. Run a humidifier at night and elevate the head of the bed. Vicks Vapor rub will help with congestion and cough. Steam showers and sinus massage for congestion.   Use Acetaminophen or Ibuprofen as needed for fever or pain. Avoid second hand smoke. Even the smallest exposure will worsen symptoms.   Over the counter medications you can try include Delsym for cough, a decongestant for congestion, and Mucinex or Robitussin as an expectorant. Be sure to just get the plain Mucinex or Robitussin that just has one medication (Guaifenesen). We don't recommend the combination products. Note, be sure to drink two glasses of water with each dose of Mucinex as the medication will not work well without adequate hydration.   You can also try a teaspoon of honey to see if this will help reduce cough. Throat lozenges can sometimes be beneficial as well.    This illness will typically last 7 - 10 days.   Please follow up with our clinic if you develop a fever greater than 101 F, symptoms worsen, or do not resolve in the next week.

## 2016-03-27 NOTE — Telephone Encounter (Signed)
lmovm for pt to return call.  

## 2016-03-27 NOTE — Telephone Encounter (Signed)
Patient notified script faxed and at pharmacy on 03/23/16

## 2016-03-27 NOTE — Progress Notes (Signed)
Pre visit review using our clinic review tool, if applicable. No additional management support is needed unless otherwise documented below in the visit note. 

## 2016-03-28 LAB — COMPREHENSIVE METABOLIC PANEL
ALT: 19 IU/L (ref 0–32)
AST: 21 IU/L (ref 0–40)
Albumin/Globulin Ratio: 2 (ref 1.2–2.2)
Albumin: 4.5 g/dL (ref 3.6–4.8)
Alkaline Phosphatase: 83 IU/L (ref 39–117)
BUN/Creatinine Ratio: 17 (ref 12–28)
BUN: 17 mg/dL (ref 8–27)
Bilirubin Total: 0.8 mg/dL (ref 0.0–1.2)
CO2: 21 mmol/L (ref 18–29)
Calcium: 8.8 mg/dL (ref 8.7–10.3)
Chloride: 103 mmol/L (ref 96–106)
Creatinine, Ser: 1.03 mg/dL — ABNORMAL HIGH (ref 0.57–1.00)
GFR calc Af Amer: 68 mL/min/{1.73_m2} (ref >59)
GFR calc non Af Amer: 59 mL/min/{1.73_m2} — ABNORMAL LOW (ref >59)
Globulin, Total: 2.2 g/dL (ref 1.5–4.5)
Glucose: 101 mg/dL — ABNORMAL HIGH (ref 65–99)
Potassium: 4.5 mmol/L (ref 3.5–5.2)
Sodium: 139 mmol/L (ref 134–144)
Total Protein: 6.7 g/dL (ref 6.0–8.5)

## 2016-03-28 NOTE — Telephone Encounter (Signed)
refaxed script to pharmacy,notified patient.

## 2016-03-28 NOTE — Telephone Encounter (Signed)
Patient stated the pharmacy did not receive the fax

## 2016-03-30 ENCOUNTER — Other Ambulatory Visit: Payer: Self-pay

## 2016-03-30 MED ORDER — CELECOXIB 200 MG PO CAPS: 200.0000 mg | ORAL_CAPSULE | Freq: Two times a day (BID) | ORAL | 0 refills | Status: DC

## 2016-04-11 ENCOUNTER — Ambulatory Visit: Payer: 59 | Admitting: Family Medicine

## 2016-05-02 ENCOUNTER — Ambulatory Visit: Payer: 59 | Admitting: Family Medicine

## 2016-05-15 ENCOUNTER — Ambulatory Visit (INDEPENDENT_AMBULATORY_CARE_PROVIDER_SITE_OTHER): Payer: 59 | Admitting: Family Medicine

## 2016-05-15 ENCOUNTER — Encounter: Payer: Self-pay | Admitting: Family Medicine

## 2016-05-15 DIAGNOSIS — M7552 Bursitis of left shoulder: Secondary | ICD-10-CM

## 2016-05-15 NOTE — Progress Notes (Signed)
Cheryl ScaleZach Sheron [name] D.O. [facility] [address] [place], [place] [address] Phone: [phone] Subjective:     CC: Left shoulder pain f/u  VHQ:[id number]:Subjective  Cheryl LaineKathryn Ann [name] is a 60 y.o. female coming in with complaint of left shoulder pain. Patient has had this pain for multiple months if not years. Patient did have a fall on her left wrist and did have a fracture. Patient also had an MRI of her shoulder 2 years ago that showed some tendinopathy but that was fairly unremarkable.   Patient seen 4 weeks ago was given an injection. States that the injection causing more pain for 3 days and then it has improved. States that she is 75% better. Very mild discomfort overall. Nothing that is severe. Risks severity of pain as 2 out of 10. Sleeping more comfortably. Has been doing a lot more activity recently with no significant increase in pain      Past Medical History:  Diagnosis Date  . 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 Arrhythmia    left bundle branch block  . Arthritis   . Blood in stool   . Chicken pox   . Generalized headaches   . History of blood transfusion   . LBBB (left bundle branch block)   . Thyroid disease   . UTI (lower urinary tract infection)    Past Surgical History:  Procedure Laterality Date  . ABDOMINAL HYSTERECTOMY  1997  . APPENDECTOMY  1981  . BREAST BIOPSY Bilateral 1976   neg  . BREAST SURGERY  1976  . CARDIAC CATHETERIZATION  2004   [facility]  . CARDIAC CATHETERIZATION  2006   [facility]  . cystic fibrosis tumor removal  1983  . THUMB AMPUTATION  1992   traumatic  . TONSILLECTOMY AND ADENOIDECTOMY  1964   Social History  Substance Use Topics  . Smoking status: Former Smoker    Packs/day: 0.25    Years: 34.00    Types: Cigarettes    Quit date: 04/10/2013  . Smokeless tobacco: Never Used  . Alcohol use Yes   Comment: occasional   Allergies  Allergen Reactions  . Peanuts [Peanut Oil]   . Penicillins Other (See Comments)    Hives,rash,nausea,swelling,   . Sulfa Antibiotics Other (See Comments)    Hives,rash,nausea,swelling  . Influenza Vac Split [Flu Virus Vaccine]     Pleurisy  1996  . Mobic [Meloxicam] Nausea Only  . Nickel    Family History  Problem Relation Age of Onset  . Heart disease Mother   . Arthritis Mother   . Cancer Mother     breast  . Hyperlipidemia Mother   . Hypertension Mother   . Heart attack Mother   . Breast cancer Mother [id number]  . Heart disease Father   . Cancer Father     hodgkins disease, prostate  . Diabetes Father   . Arthritis Father   . Hypertension Father   . Heart disease Sister   . Diabetes Sister   . Heart disease Brother [id number]    ami x 8,  4 vessel CABG   . Diabetes Brother   . Liver disease Brother   . Lung disease Brother   . Heart disease Maternal Grandmother   . Diabetes Maternal Grandmother   . Heart disease Maternal Grandfather   . Heart disease Paternal Grandmother   . Diabetes Paternal Grandmother   .  Stroke Paternal Grandfather   . Heart disease Paternal Grandfather   . Diabetes Paternal Grandfather   . Diabetes Maternal Aunt      Past medical history, social, surgical and family history all reviewed in electronic medical record.   Review of Systems: No headache, visual changes, nausea, vomiting, diarrhea, constipation, dizziness, abdominal pain, skin rash, fevers, chills, night sweats, weight loss, swollen lymph nodes, , muscle aches, chest pain, shortness of breath, mood changes.    Objective  There were no vitals taken for this visit.  Systems examined below as of 05/15/16 General: NAD A&O x3 mood, affect normal  HEENT: Pupils equal, extraocular movements intact no nystagmus Respiratory: not short of breath at rest or with speaking Cardiovascular: No lower extremity edema, non tender Skin: Warm dry intact with no signs of  infection or rash on extremities or on axial skeleton. Abdomen: Soft nontender, no masses Neuro: Cranial nerves  intact, neurovascularly intact in all extremities with 2+ DTRs and 2+ pulses. Lymph: No lymphadenopathy appreciated today  Gait normal with good balance and coordination.  MSK: Non tender with full range of motion and good stability and symmetric strength and tone of , elbows, wrist,  knee hips and ankles bilaterally.  Partial amputation of right thumb Shoulder: left Inspection reveals no abnormalities, atrophy or asymmetry. Palpation is normal with no tenderness over AC joint or bicipital groove. ROM is full in all planes passively. Rotator cuff strength normal throughout. Minimal impingement sign still remaining Speeds and Yergason's tests normal. No labral pathology noted with negative Obrien's, negative clunk and good stability. Normal scapular function observed. No painful arc and no drop arm sign. No apprehension sign Contralateral side unremarkable Significant improvement from previous exam          Impression and Recommendations:     This case required medical decision making of moderate complexity.

## 2016-05-15 NOTE — Patient Instructions (Addendum)
Good to see you  You are doing great  Keep hands within peripheral vision  Good luck with baking Happy holidays!  See me when you need me

## 2016-05-15 NOTE — Assessment & Plan Note (Signed)
Much improved at this time. No significant change in management. Patient continue with conservative therapy and follow-up as needed.

## 2016-06-01 ENCOUNTER — Inpatient Hospital Stay: Payer: 59 | Attending: Internal Medicine

## 2016-06-01 ENCOUNTER — Encounter: Payer: Self-pay | Admitting: Internal Medicine

## 2016-06-01 ENCOUNTER — Ambulatory Visit (INDEPENDENT_AMBULATORY_CARE_PROVIDER_SITE_OTHER): Payer: 59 | Admitting: Internal Medicine

## 2016-06-01 DIAGNOSIS — Z87440 Personal history of urinary (tract) infections: Secondary | ICD-10-CM | POA: Diagnosis not present

## 2016-06-01 DIAGNOSIS — D509 Iron deficiency anemia, unspecified: Secondary | ICD-10-CM | POA: Diagnosis not present

## 2016-06-01 DIAGNOSIS — M129 Arthropathy, unspecified: Secondary | ICD-10-CM | POA: Insufficient documentation

## 2016-06-01 DIAGNOSIS — R928 Other abnormal and inconclusive findings on diagnostic imaging of breast: Secondary | ICD-10-CM

## 2016-06-01 DIAGNOSIS — Z8042 Family history of malignant neoplasm of prostate: Secondary | ICD-10-CM | POA: Diagnosis not present

## 2016-06-01 DIAGNOSIS — Z807 Family history of other malignant neoplasms of lymphoid, hematopoietic and related tissues: Secondary | ICD-10-CM | POA: Insufficient documentation

## 2016-06-01 DIAGNOSIS — D563 Thalassemia minor: Secondary | ICD-10-CM | POA: Insufficient documentation

## 2016-06-01 DIAGNOSIS — Z862 Personal history of diseases of the blood and blood-forming organs and certain disorders involving the immune mechanism: Secondary | ICD-10-CM | POA: Insufficient documentation

## 2016-06-01 DIAGNOSIS — I499 Cardiac arrhythmia, unspecified: Secondary | ICD-10-CM | POA: Insufficient documentation

## 2016-06-01 DIAGNOSIS — I447 Left bundle-branch block, unspecified: Secondary | ICD-10-CM | POA: Diagnosis not present

## 2016-06-01 DIAGNOSIS — Z79899 Other long term (current) drug therapy: Secondary | ICD-10-CM | POA: Diagnosis not present

## 2016-06-01 DIAGNOSIS — M19211 Secondary osteoarthritis, right shoulder: Secondary | ICD-10-CM

## 2016-06-01 DIAGNOSIS — D508 Other iron deficiency anemias: Secondary | ICD-10-CM | POA: Diagnosis not present

## 2016-06-01 DIAGNOSIS — R918 Other nonspecific abnormal finding of lung field: Secondary | ICD-10-CM | POA: Insufficient documentation

## 2016-06-01 DIAGNOSIS — Z87891 Personal history of nicotine dependence: Secondary | ICD-10-CM | POA: Diagnosis not present

## 2016-06-01 DIAGNOSIS — M19212 Secondary osteoarthritis, left shoulder: Secondary | ICD-10-CM

## 2016-06-01 DIAGNOSIS — M5440 Lumbago with sciatica, unspecified side: Secondary | ICD-10-CM

## 2016-06-01 DIAGNOSIS — G8929 Other chronic pain: Secondary | ICD-10-CM

## 2016-06-01 DIAGNOSIS — Z803 Family history of malignant neoplasm of breast: Secondary | ICD-10-CM | POA: Diagnosis not present

## 2016-06-01 DIAGNOSIS — E079 Disorder of thyroid, unspecified: Secondary | ICD-10-CM | POA: Insufficient documentation

## 2016-06-01 LAB — CBC WITH DIFFERENTIAL/PLATELET
Basophils Absolute: 0.1 10*3/uL (ref 0–0.1)
Basophils Relative: 1 %
Eosinophils Absolute: 0.2 10*3/uL (ref 0–0.7)
Eosinophils Relative: 2 %
HCT: 31.9 % — ABNORMAL LOW (ref 35.0–47.0)
Hemoglobin: 10.2 g/dL — ABNORMAL LOW (ref 12.0–16.0)
Lymphocytes Relative: 26 %
Lymphs Abs: 3 10*3/uL (ref 1.0–3.6)
MCH: 22.4 pg — ABNORMAL LOW (ref 26.0–34.0)
MCHC: 32 g/dL (ref 32.0–36.0)
MCV: 70.3 fL — ABNORMAL LOW (ref 80.0–100.0)
Monocytes Absolute: 0.7 10*3/uL (ref 0.2–0.9)
Monocytes Relative: 6 %
Neutro Abs: 7.3 10*3/uL — ABNORMAL HIGH (ref 1.4–6.5)
Neutrophils Relative %: 65 %
Platelets: 276 10*3/uL (ref 150–440)
RBC: 4.53 MIL/uL (ref 3.80–5.20)
RDW: 18.4 % — ABNORMAL HIGH (ref 11.5–14.5)
WBC: 11.3 10*3/uL — ABNORMAL HIGH (ref 3.6–11.0)

## 2016-06-01 LAB — IRON AND TIBC
Iron: 93 ug/dL (ref 28–170)
Saturation Ratios: 30 % (ref 10.4–31.8)
TIBC: 314 ug/dL (ref 250–450)
UIBC: 221 ug/dL

## 2016-06-01 LAB — FERRITIN: Ferritin: 232 ng/mL (ref 11–307)

## 2016-06-01 MED ORDER — HYDROCODONE-ACETAMINOPHEN 5-325 MG PO TABS: 1.0000 | ORAL_TABLET | Freq: Every evening | ORAL | 0 refills | Status: DC | PRN

## 2016-06-01 MED ORDER — TRAMADOL HCL 50 MG PO TABS: 50.0000 mg | ORAL_TABLET | Freq: Two times a day (BID) | ORAL | 5 refills | Status: DC

## 2016-06-01 MED ORDER — HYDROCODONE-ACETAMINOPHEN 5-325 MG PO TABS
1.0000 | ORAL_TABLET | Freq: Every evening | ORAL | 0 refills | Status: DC | PRN
Start: 2016-06-01 — End: 2016-06-01

## 2016-06-01 NOTE — Patient Instructions (Signed)
I have refilled the tramadol for twice daily use,  And will continue to prescribe vicodin for use at bedtime only,   RTC 3 months

## 2016-06-01 NOTE — Progress Notes (Signed)
Subjective:  Patient ID: Cheryl [name], female    DOB: Dec 10, 1955  Age: 61 y.o. MRN: [id number]  CC: Diagnoses of Other secondary osteoarthritis of both shoulders, Chronic bilateral low back pain with sciatica, sciatica laterality unspecified, Other iron deficiency anemia, and Abnormal mammogram of right breast were pertinent to this visit.  HPI Cheryl [name] presents for follow up on chronic pain secondary to OA involving shoulders, neck and lower back   On  Previous visits  I have discouraged daily use of vicodin with alternative medication  Including trial of tramadol and celebrex.  She is not tolerating the tramadol due to increased agitation .  There have been no unauthorized  refills of narcotics /analaogues per review of the [place] controlled substance database.   Had a steroid injection in left shoulder recently that resulted in 3 days of severe pain in the arm that resulted in inability to raise arm above the shoulder,  Followed by acute viral bronchitis.    Refuses influenza vaccine    Getting iron infusions by hematology , the treatment is perceived as improving  her fatigue.   Outpatient Medications Prior to Visit  Medication Sig Dispense Refill  . buPROPion (WELLBUTRIN SR) 100 MG 12 hr tablet Take 1 tablet by mouth two  times daily 180 tablet 2  . Calcium Carbonate-Vit D-Min (CALCIUM 1200 PO) Take 1 tablet by mouth daily.    . celecoxib (CELEBREX) 200 MG capsule Take 1 capsule (200 mg total) by mouth 2 (two) times daily. 180 capsule 0  . Cholecalciferol (VITAMIN D PO) Take by mouth. Pt takes 5000iu daily    . CINNAMON PO Take 2,000 mg by mouth daily.    . clobetasol ointment (TEMOVATE) 0.05 % Apply 1 application topically 2 (two) times daily. Until resolved (Patient taking differently: Apply 1 application topically daily. Until resolved) 30 g 2  . Cyanocobalamin (VITAMIN B 12 PO) Take by mouth.    . cyclobenzaprine (FLEXERIL) 5 MG tablet 1-2 TABLETS AT BEDTIME, CAN BE  INCREASED TO TWICE A DAY AS NEEDED FOR MUSCLE SPASM  2  . Diclofenac Sodium 1.5 % SOLN Apply to painful joint twice daily 150 mL 3  . Docusate Calcium (STOOL SOFTENER PO) Take by mouth.    . EPINEPHrine (EPIPEN) 0.3 mg/0.3 mL DEVI Inject 0.3 mLs (0.3 mg total) into the muscle once. (Patient taking differently: Inject 0.3 mg into the muscle as needed. ) 1 Device 5  . hydrOXYzine (ATARAX/VISTARIL) 25 MG tablet TAKE 1 TABLET (25 MG TOTAL) BY MOUTH 3 (THREE) TIMES DAILY AS NEEDED FOR ITCHING. 90 tablet 3  . Lactulose 20 GM/30ML SOLN 30 ml every 4 hours until constipation is relieved 236 mL 3  . Multiple Vitamin (MULTIVITAMIN) tablet Take 1 tablet by mouth daily.    . nitroGLYCERIN (NITROSTAT) 0.4 MG SL tablet Place 1 tablet (0.4 mg total) under the tongue every 5 (five) minutes as needed for chest pain. 25 tablet 3  . Omega-3 Fatty Acids (FISH OIL) 1000 MG CAPS Take 1 capsule by mouth daily.    . progesterone (PROMETRIUM) 200 MG capsule Take 200 mg by mouth daily.    . propranolol (INDERAL) 10 MG tablet Take 1 tablet (10 mg total) by mouth 3 (three) times daily. 21 tablet 6  . vitamin C (ASCORBIC ACID) 500 MG tablet Take 500 mg by mouth daily.    . Vitamin K, Phytonadione, 100 MCG TABS Take by mouth.    . benzonatate (TESSALON) 100 MG capsule Take  1 capsule (100 mg total) by mouth 2 (two) times daily as needed for cough. 20 capsule 0  . Dextromethorphan-Guaifenesin 5-100 MG/5ML LIQD Take 10 mLs by mouth at bedtime as needed (for cough). 1 Bottle 0  . traMADol (ULTRAM) 50 MG tablet TAKE 1 TABLET BY MOUTH EVERY 8 HOURS AS NEEDED 90 tablet 2   No facility-administered medications prior to visit.     Review of Systems;  Patient denies headache, fevers, malaise, unintentional weight loss, skin rash, eye pain, sinus congestion and sinus pain, sore throat, dysphagia,  hemoptysis , cough, dyspnea, wheezing, chest pain, palpitations, orthopnea, edema, abdominal pain, nausea, melena, diarrhea, constipation,  flank pain, dysuria, hematuria, urinary  Frequency, nocturia, numbness, tingling, seizures,  Focal weakness, Loss of consciousness,  Tremor, insomnia, depression, anxiety, and suicidal ideation.      Objective:  BP 140/76   Pulse 90   Resp 16   Ht 5\' 3"  (1.6 m)   Wt 160 lb 12 oz (72.9 kg)   SpO2 97%   BMI 28.48 kg/m   BP Readings from Last 3 Encounters:  06/01/16 140/76  03/27/16 120/85  03/19/16 136/72    Wt Readings from Last 3 Encounters:  06/01/16 160 lb 12 oz (72.9 kg)  03/27/16 160 lb 3.2 oz (72.7 kg)  03/19/16 163 lb (73.9 kg)    General appearance: alert, cooperative and appears stated age Ears: normal TM's and external ear canals both ears Throat: lips, mucosa, and tongue normal; teeth and gums normal Neck: no adenopathy, no carotid bruit, supple, symmetrical, trachea midline and thyroid not enlarged, symmetric, no tenderness/mass/nodules Back: symmetric, no curvature. ROM normal. No CVA tenderness. Lungs: clear to auscultation bilaterally Heart: regular rate and rhythm, S1, S2 normal, no murmur, click, rub or gallop Abdomen: soft, non-tender; bowel sounds normal; no masses,  no organomegaly Pulses: 2+ and symmetric Skin: Skin color, texture, turgor normal. No rashes or lesions Lymph nodes: Cervical, supraclavicular, and axillary nodes normal.  Lab Results  Component Value Date   HGBA1C 5.1 10/26/2015   HGBA1C 5.4 11/25/2013    Lab Results  Component Value Date   CREATININE 1.03 (H) 03/27/2016   CREATININE 0.85 11/15/2015   CREATININE 0.97 10/26/2015    Lab Results  Component Value Date   WBC 11.3 (H) 06/01/2016   HGB 10.2 (L) 06/01/2016   HCT 31.9 (L) 06/01/2016   PLT 276 06/01/2016   GLUCOSE 101 (H) 03/27/2016   CHOL 152 10/26/2015   TRIG 109 10/26/2015   HDL 32 (L) 10/26/2015   LDLDIRECT 104 (H) 08/15/2012   LDLCALC 98 10/26/2015   ALT 19 03/27/2016   AST 21 03/27/2016   NA 139 03/27/2016   K 4.5 03/27/2016   CL 103 03/27/2016    CREATININE 1.03 (H) 03/27/2016   BUN 17 03/27/2016   CO2 21 03/27/2016   TSH 0.915 10/26/2015   INR 1.0 09/01/2014   HGBA1C 5.1 10/26/2015    [place] Breast Ltd Uni Right Inc Axilla  Result Date: 03/21/2016 CLINICAL DATA:  Possible mass right breast identified posteriorly on the MLO view of the recent screening mammogram, projecting over the pectoralis muscle. EXAM: 2D DIGITAL DIAGNOSTIC RIGHT MAMMOGRAM WITH ADJUNCT TOMO ULTRASOUND RIGHT BREAST COMPARISON:  03/02/2016 and earlier priors ACR Breast Density Category c: The breast tissue is heterogeneously dense, which may obscure small masses. FINDINGS: A focal spot compression view of the superior right breast in the MLO projection tomography shows no evidence of mass over the pectoralis muscle. Exaggerated CC lateral view of  the right breast and 90 degree lateral view the right breast are both negative; the questioned area of concern on recent screening mammogram is not identified on today's additional images images. Targeted ultrasound is performed, showing a normal intramammary lymph node in the 9 o'clock region of the right breast 8 cm from the nipple, measuring 7 mm. In the 10 o'clock position the right breast 10 cm from the nipple part to normal intramammary lymph nodes. One of these lymph nodes may have accounted for the possible mass identified on the recent screening mammogram. No suspicious masses identified on ultrasound. IMPRESSION: Normal intramammary lymph nodes identified in the far outer right breast. No evidence of malignancy. RECOMMENDATION: Screening mammogram in one year.(Code:SM-B-01Y) I have discussed the findings and recommendations with the patient. Results were also provided in writing at the conclusion of the visit. If applicable, a reminder letter will be sent to the patient regarding the next appointment. BI-RADS CATEGORY  2: Benign. Electronically Signed   By: Britta Mccreedy M.D.   On: 03/21/2016 16:37   Mm Diag Breast Tomo Uni  Right  Result Date: 03/21/2016 CLINICAL DATA:  Possible mass right breast identified posteriorly on the MLO view of the recent screening mammogram, projecting over the pectoralis muscle. EXAM: 2D DIGITAL DIAGNOSTIC RIGHT MAMMOGRAM WITH ADJUNCT TOMO ULTRASOUND RIGHT BREAST COMPARISON:  03/02/2016 and earlier priors ACR Breast Density Category c: The breast tissue is heterogeneously dense, which may obscure small masses. FINDINGS: A focal spot compression view of the superior right breast in the MLO projection tomography shows no evidence of mass over the pectoralis muscle. Exaggerated CC lateral view of the right breast and 90 degree lateral view the right breast are both negative; the questioned area of concern on recent screening mammogram is not identified on today's additional images images. Targeted ultrasound is performed, showing a normal intramammary lymph node in the 9 o'clock region of the right breast 8 cm from the nipple, measuring 7 mm. In the 10 o'clock position the right breast 10 cm from the nipple part to normal intramammary lymph nodes. One of these lymph nodes may have accounted for the possible mass identified on the recent screening mammogram. No suspicious masses identified on ultrasound. IMPRESSION: Normal intramammary lymph nodes identified in the far outer right breast. No evidence of malignancy. RECOMMENDATION: Screening mammogram in one year.(Code:SM-B-01Y) I have discussed the findings and recommendations with the patient. Results were also provided in writing at the conclusion of the visit. If applicable, a reminder letter will be sent to the patient regarding the next appointment. BI-RADS CATEGORY  2: Benign. Electronically Signed   By: Britta Mccreedy M.D.   On: 03/21/2016 16:37    Assessment & Plan:   Problem List Items Addressed This Visit    Abnormal mammogram of right breast    Targeted views and ultrasound failed to find a mass,  Follow up Oct 2018 screening bilateral.         Chronic lower back pain    Previously managed with bid vicodin , which I have now discouraged. Trial of celebrex and tramadol has been tolerated during the day but she has been more agitated at night and unable to sleep.  Will resume qhs vicodin. Refill history confirmed via [place] Controlled Substance databas, accessed by me today. MRI lumbar spine ordered       Relevant Medications   traMADol (ULTRAM) 50 MG tablet   HYDROcodone-acetaminophen (NORCO/VICODIN) 5-325 MG tablet   Other Relevant Orders   MR Lumbar  Spine Wo Contrast   Iron deficiency anemia    Managed with IV iron infusions by hematology. She has thalassemia as well.   Lab Results  Component Value Date   IRON 93 06/01/2016   TIBC 314 06/01/2016   FERRITIN 232 06/01/2016   Lab Results  Component Value Date   WBC 11.3 (H) 06/01/2016   HGB 10.2 (L) 06/01/2016   HCT 31.9 (L) 06/01/2016   MCV 70.3 (L) 06/01/2016   PLT 276 06/01/2016          Other Visit Diagnoses    Other secondary osteoarthritis of both shoulders       Relevant Medications   traMADol (ULTRAM) 50 MG tablet   HYDROcodone-acetaminophen (NORCO/VICODIN) 5-325 MG tablet      I have discontinued Ms. Koelzer's benzonatate and Dextromethorphan-Guaifenesin. I have also changed her traMADol. Additionally, I am having her maintain her Cholecalciferol (VITAMIN D PO), Calcium Carbonate-Vit D-Min (CALCIUM 1200 PO), CINNAMON PO, vitamin C, Fish Oil, multivitamin, progesterone, Docusate Calcium (STOOL SOFTENER PO), EPINEPHrine, Cyanocobalamin (VITAMIN B 12 PO), hydrOXYzine, clobetasol ointment, Diclofenac Sodium, nitroGLYCERIN, propranolol, buPROPion, Vitamin K (Phytonadione), Lactulose, cyclobenzaprine, celecoxib, and HYDROcodone-acetaminophen.  Meds ordered this encounter  Medications  . traMADol (ULTRAM) 50 MG tablet    Sig: Take 1 tablet (50 mg total) by mouth 2 (two) times daily. For chronic pain    Dispense:  60 tablet    Refill:  5    Not to exceed 3  additional fills before 06/02/2016  . DISCONTD: HYDROcodone-acetaminophen (NORCO/VICODIN) 5-325 MG tablet    Sig: Take 1 tablet by mouth at bedtime as needed. For pain    Dispense:  30 tablet    Refill:  0  . DISCONTD: HYDROcodone-acetaminophen (NORCO/VICODIN) 5-325 MG tablet    Sig: Take 1 tablet by mouth at bedtime as needed. For pain    Dispense:  30 tablet    Refill:  0    May refiill on or after July 01 2016  . HYDROcodone-acetaminophen (NORCO/VICODIN) 5-325 MG tablet    Sig: Take 1 tablet by mouth at bedtime as needed. For pain    Dispense:  30 tablet    Refill:  0    May refiill on or after  July 29 2016    Medications Discontinued During This Encounter  Medication Reason  . benzonatate (TESSALON) 100 MG capsule Completed Course  . Dextromethorphan-Guaifenesin 5-100 MG/5ML LIQD Completed Course  . traMADol (ULTRAM) 50 MG tablet Reorder  . HYDROcodone-acetaminophen (NORCO/VICODIN) 5-325 MG tablet Reorder  . HYDROcodone-acetaminophen (NORCO/VICODIN) 5-325 MG tablet Reorder    Follow-up: Return in about 3 months (around 08/30/2016) for medication refill.   Sherlene Shams, MD

## 2016-06-03 ENCOUNTER — Telehealth: Payer: Self-pay | Admitting: Internal Medicine

## 2016-06-03 DIAGNOSIS — R928 Other abnormal and inconclusive findings on diagnostic imaging of breast: Secondary | ICD-10-CM | POA: Insufficient documentation

## 2016-06-03 NOTE — Assessment & Plan Note (Addendum)
Previously managed with bid vicodin , which I have now discouraged. Trial of celebrex and tramadol has been tolerated during the day but she has been more agitated at night and unable to sleep.  Will resume qhs vicodin. Refill history confirmed via [place] Controlled Substance databas, accessed by me today. MRI lumbar spine ordered

## 2016-06-03 NOTE — Assessment & Plan Note (Signed)
Targeted views and ultrasound failed to find a mass,  Follow up Oct 2018 screening bilateral.

## 2016-06-03 NOTE — Assessment & Plan Note (Signed)
Managed with IV iron infusions by hematology. She has thalassemia as well.   Lab Results  Component Value Date   IRON 93 06/01/2016   TIBC 314 06/01/2016   FERRITIN 232 06/01/2016   Lab Results  Component Value Date   WBC 11.3 (H) 06/01/2016   HGB 10.2 (L) 06/01/2016   HCT 31.9 (L) 06/01/2016   MCV 70.3 (L) 06/01/2016   PLT 276 06/01/2016

## 2016-06-03 NOTE — Telephone Encounter (Signed)
After reviewing her chart, in order for me to resume her pain management with vicodin, she will need to have an MRI of the lumbar spi ne, which I have ordered.

## 2016-06-04 NOTE — Telephone Encounter (Signed)
Patient advised of below and verbalized an understanding  

## 2016-06-08 ENCOUNTER — Inpatient Hospital Stay: Payer: 59

## 2016-06-08 ENCOUNTER — Inpatient Hospital Stay (HOSPITAL_BASED_OUTPATIENT_CLINIC_OR_DEPARTMENT_OTHER): Payer: 59 | Admitting: Internal Medicine

## 2016-06-08 VITALS — BP 149/80 | HR 103 | Temp 97.9°F | Wt 163.4 lb

## 2016-06-08 DIAGNOSIS — I447 Left bundle-branch block, unspecified: Secondary | ICD-10-CM

## 2016-06-08 DIAGNOSIS — Z79899 Other long term (current) drug therapy: Secondary | ICD-10-CM

## 2016-06-08 DIAGNOSIS — Z87440 Personal history of urinary (tract) infections: Secondary | ICD-10-CM

## 2016-06-08 DIAGNOSIS — I499 Cardiac arrhythmia, unspecified: Secondary | ICD-10-CM

## 2016-06-08 DIAGNOSIS — Z807 Family history of other malignant neoplasms of lymphoid, hematopoietic and related tissues: Secondary | ICD-10-CM

## 2016-06-08 DIAGNOSIS — D509 Iron deficiency anemia, unspecified: Secondary | ICD-10-CM | POA: Diagnosis not present

## 2016-06-08 DIAGNOSIS — Z803 Family history of malignant neoplasm of breast: Secondary | ICD-10-CM

## 2016-06-08 DIAGNOSIS — Z87891 Personal history of nicotine dependence: Secondary | ICD-10-CM

## 2016-06-08 DIAGNOSIS — D563 Thalassemia minor: Secondary | ICD-10-CM | POA: Diagnosis not present

## 2016-06-08 DIAGNOSIS — M129 Arthropathy, unspecified: Secondary | ICD-10-CM

## 2016-06-08 DIAGNOSIS — Z8042 Family history of malignant neoplasm of prostate: Secondary | ICD-10-CM

## 2016-06-08 DIAGNOSIS — R918 Other nonspecific abnormal finding of lung field: Secondary | ICD-10-CM | POA: Diagnosis not present

## 2016-06-08 DIAGNOSIS — E079 Disorder of thyroid, unspecified: Secondary | ICD-10-CM

## 2016-06-08 DIAGNOSIS — Z862 Personal history of diseases of the blood and blood-forming organs and certain disorders involving the immune mechanism: Secondary | ICD-10-CM

## 2016-06-08 NOTE — Progress Notes (Signed)
[facility] CONSULT NOTE  Patient Care Team: Sherlene Shams, MD as PCP - General (Internal Medicine) Earline Mayotte, MD as Consulting Physician (General Surgery)  CHIEF COMPLAINTS/PURPOSE OF CONSULTATION:    HEMATOLOGY HISTORY  # CHRONIC MICROCYTIC ANEMIA- BETA THALASSEMIA MINOR [colo- 2016; Dr. Fanny Skates July 2017- IV trial of Venofer x4   # Lung nodules [CT- sub cm Jan 2017]- followed by PCP  HISTORY OF PRESENTING ILLNESS:  Cheryl [name] 61 y.o.  female with a history of beta thalassemia minor; also iron deficiency anemia- status post IV iron in July 2017 is here for follow-up.   Patient continues to eat iron rich foods. Her energy is adequate. Denies any Difficult to swallowing or constipation or diarrhea. Denies any night sweats. No nausea no vomiting no blood in stools.   ROS: A complete 10 point review of system is done which is negative except mentioned above in history of present illness  MEDICAL HISTORY:  Past Medical History:  Diagnosis Date  . 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 Arrhythmia    left bundle branch block  . Arthritis   . Blood in stool   . Chicken pox   . Generalized headaches   . History of blood transfusion   . LBBB (left bundle branch block)   . Thyroid disease   . UTI (lower urinary tract infection)     SURGICAL HISTORY: Past Surgical History:  Procedure Laterality Date  . ABDOMINAL HYSTERECTOMY  1997  . APPENDECTOMY  1981  . BREAST BIOPSY Bilateral 1976   neg  . BREAST SURGERY  1976  . CARDIAC CATHETERIZATION  2004   [facility]  . CARDIAC CATHETERIZATION  2006   [facility]  . cystic fibrosis tumor removal  1983  . THUMB AMPUTATION  1992   traumatic  . TONSILLECTOMY AND ADENOIDECTOMY  1964    SOCIAL HISTORY: Social History   Social History  . Marital status: Married    Spouse name:  N/A  . Number of children: N/A  . Years of education: N/A   Occupational History  . Not on file.   Social History Main Topics  . Smoking status: Former Smoker    Packs/day: 0.25    Years: 34.00    Types: Cigarettes    Quit date: 04/10/2013  . Smokeless tobacco: Never Used  . Alcohol use Yes     Comment: occasional  . Drug use: No  . Sexual activity: Not on file   Other Topics Concern  . Not on file   Social History Narrative   Married to [employer]   Works for [employer], Engineer, structural    FAMILY HISTORY: Family History  Problem Relation Age of Onset  . Heart disease Mother   . Arthritis Mother   . Cancer Mother     breast  . Hyperlipidemia Mother   . Hypertension Mother   . Heart attack Mother   . Breast cancer Mother 74  . Heart disease Father   . Cancer Father     hodgkins disease, prostate  . Diabetes Father   . Arthritis Father   . Hypertension Father   . Heart disease Sister   . Diabetes Sister   . Heart disease Brother 58    ami x 8,  4 vessel CABG   . Diabetes Brother   . Liver disease Brother   .  Lung disease Brother   . Heart disease Maternal Grandmother   . Diabetes Maternal Grandmother   . Heart disease Maternal Grandfather   . Heart disease Paternal Grandmother   . Diabetes Paternal Grandmother   . Stroke Paternal Grandfather   . Heart disease Paternal Grandfather   . Diabetes Paternal Grandfather   . Diabetes Maternal Aunt     ALLERGIES:  is allergic to peanuts [peanut oil]; penicillins; sulfa antibiotics; influenza vac split [flu virus vaccine]; mobic [meloxicam]; and nickel.  MEDICATIONS:  Current Outpatient Prescriptions  Medication Sig Dispense Refill  . buPROPion (WELLBUTRIN SR) 100 MG 12 hr tablet Take 1 tablet by mouth two  times daily 180 tablet 2  . Calcium Carbonate-Vit D-Min (CALCIUM 1200 PO) Take 1 tablet by mouth daily.    . celecoxib (CELEBREX) 200 MG capsule Take 1 capsule (200 mg total) by mouth 2 (two) times  daily. 180 capsule 0  . Cholecalciferol (VITAMIN D PO) Take by mouth. Pt takes 5000iu daily    . CINNAMON PO Take 2,000 mg by mouth daily.    . clobetasol ointment (TEMOVATE) 0.05 % Apply 1 application topically 2 (two) times daily. Until resolved (Patient taking differently: Apply 1 application topically daily. Until resolved) 30 g 2  . Cyanocobalamin (VITAMIN B 12 PO) Take by mouth.    . cyclobenzaprine (FLEXERIL) 5 MG tablet 1-2 TABLETS AT BEDTIME, CAN BE INCREASED TO TWICE A DAY AS NEEDED FOR MUSCLE SPASM  2  . Diclofenac Sodium 1.5 % SOLN Apply to painful joint twice daily 150 mL 3  . Docusate Calcium (STOOL SOFTENER PO) Take by mouth.    . EPINEPHrine (EPIPEN) 0.3 mg/0.3 mL DEVI Inject 0.3 mLs (0.3 mg total) into the muscle once. (Patient taking differently: Inject 0.3 mg into the muscle as needed. ) 1 Device 5  . HYDROcodone-acetaminophen (NORCO/VICODIN) 5-325 MG tablet Take 1 tablet by mouth at bedtime as needed. For pain 30 tablet 0  . hydrOXYzine (ATARAX/VISTARIL) 25 MG tablet TAKE 1 TABLET (25 MG TOTAL) BY MOUTH 3 (THREE) TIMES DAILY AS NEEDED FOR ITCHING. 90 tablet 3  . Lactulose 20 GM/30ML SOLN 30 ml every 4 hours until constipation is relieved 236 mL 3  . Multiple Vitamin (MULTIVITAMIN) tablet Take 1 tablet by mouth daily.    . nitroGLYCERIN (NITROSTAT) 0.4 MG SL tablet Place 1 tablet (0.4 mg total) under the tongue every 5 (five) minutes as needed for chest pain. 25 tablet 3  . Omega-3 Fatty Acids (FISH OIL) 1000 MG CAPS Take 1 capsule by mouth daily.    . progesterone (PROMETRIUM) 200 MG capsule Take 200 mg by mouth daily.    . propranolol (INDERAL) 10 MG tablet Take 1 tablet (10 mg total) by mouth 3 (three) times daily. 21 tablet 6  . traMADol (ULTRAM) 50 MG tablet Take 1 tablet (50 mg total) by mouth 2 (two) times daily. For chronic pain 60 tablet 5  . vitamin C (ASCORBIC ACID) 500 MG tablet Take 500 mg by mouth daily.    . Vitamin K, Phytonadione, 100 MCG TABS Take by mouth.      No current facility-administered medications for this visit.       [name].  PHYSICAL EXAMINATION:   Vitals:   06/08/16 1429  BP: (!) 149/80  Pulse: (!) 103  Temp: 97.9 F (36.6 C)   Filed Weights   06/08/16 1429  Weight: 163 lb 6 oz (74.1 kg)    GENERAL: Well-nourished well-developed; Alert, no distress and comfortable.  Alone.  EYES: no pallor or icterus OROPHARYNX: no thrush or ulceration; good dentition  NECK: supple, no masses felt LYMPH:  no palpable lymphadenopathy in the cervical, axillary or inguinal regions LUNGS: clear to auscultation and  No wheeze or crackles HEART/CVS: regular rate & rhythm and no murmurs; No lower extremity edema ABDOMEN: abdomen soft, non-tender and normal bowel sounds Musculoskeletal:no cyanosis of digits and no clubbing  PSYCH: alert & oriented x 3 with fluent speech NEURO: no focal motor/sensory deficits SKIN:  no rashes or significant lesions  LABORATORY DATA:  I have reviewed the data as listed Lab Results  Component Value Date   WBC 11.3 (H) 06/01/2016   HGB 10.2 (L) 06/01/2016   HCT 31.9 (L) 06/01/2016   MCV 70.3 (L) 06/01/2016   PLT 276 06/01/2016    Recent Labs  10/26/15 0855 11/15/15 1538 03/27/16 0811  NA 139 136 139  K 4.4 4.0 4.5  CL 102 106 103  CO2 17* 23 21  GLUCOSE 118* 97 101*  BUN 13 18 17   CREATININE 0.97 0.85 1.03*  CALCIUM 9.5 9.8 8.8  GFRNONAA 64 >60 59*  GFRAA 73 >60 68  PROT 6.5 7.2 6.7  ALBUMIN 4.4 4.4 4.5  AST 21 18 21   ALT 20 17 19   ALKPHOS 70 62 83  BILITOT 0.8 1.1 0.8     No results found.  ASSESSMENT & PLAN:   Microcytic anemia # Chronic microcytic anemia/history of beta thalassemia - out of proportion to her anemia. S/p IV venoferx4- improved symptoms. Hb today 10;  Iron sat- 30%. Recommend continued dietary intervention.  # Lung nodules- monitor for now; recommend CT in Feb 2018; thru PCP.    # recheck labs in 6 months- 1 week prior; venofer IV possible.    Earna Coder, MD 06/08/2016 2:58 PM

## 2016-06-08 NOTE — Progress Notes (Signed)
Patient here today for follow up.   

## 2016-06-08 NOTE — Assessment & Plan Note (Signed)
#   Chronic microcytic anemia/history of beta thalassemia - out of proportion to her anemia. S/p IV venoferx4- improved symptoms. Hb today 10;  Iron sat- 30%. Recommend continued dietary intervention.  # Lung nodules- monitor for now; recommend CT in Feb 2018; thru PCP.    # recheck labs in 6 months- 1 week prior; venofer IV possible.

## 2016-06-09 ENCOUNTER — Ambulatory Visit
Admission: RE | Admit: 2016-06-09 | Discharge: 2016-06-09 | Disposition: A | Discharge status: Home / Self Care | Payer: 59 | Source: Ambulatory Visit | Attending: Internal Medicine | Admitting: Internal Medicine

## 2016-06-09 DIAGNOSIS — R937 Abnormal findings on diagnostic imaging of other parts of musculoskeletal system: Secondary | ICD-10-CM | POA: Insufficient documentation

## 2016-06-09 DIAGNOSIS — M5440 Lumbago with sciatica, unspecified side: Secondary | ICD-10-CM | POA: Diagnosis present

## 2016-06-09 DIAGNOSIS — M5136 Other intervertebral disc degeneration, lumbar region: Secondary | ICD-10-CM | POA: Diagnosis not present

## 2016-06-09 DIAGNOSIS — G8929 Other chronic pain: Secondary | ICD-10-CM | POA: Insufficient documentation

## 2016-06-09 DIAGNOSIS — M48061 Spinal stenosis, lumbar region without neurogenic claudication: Secondary | ICD-10-CM | POA: Insufficient documentation

## 2016-06-10 ENCOUNTER — Encounter: Payer: Self-pay | Admitting: Internal Medicine

## 2016-06-30 ENCOUNTER — Other Ambulatory Visit: Payer: Self-pay | Admitting: Internal Medicine

## 2016-08-10 ENCOUNTER — Other Ambulatory Visit (INDEPENDENT_AMBULATORY_CARE_PROVIDER_SITE_OTHER): Payer: Self-pay | Admitting: Vascular Surgery

## 2016-08-10 DIAGNOSIS — I728 Aneurysm of other specified arteries: Secondary | ICD-10-CM

## 2016-08-13 ENCOUNTER — Ambulatory Visit (INDEPENDENT_AMBULATORY_CARE_PROVIDER_SITE_OTHER): Payer: 59 | Admitting: Vascular Surgery

## 2016-08-13 ENCOUNTER — Ambulatory Visit (INDEPENDENT_AMBULATORY_CARE_PROVIDER_SITE_OTHER): Payer: 59

## 2016-08-13 ENCOUNTER — Encounter (INDEPENDENT_AMBULATORY_CARE_PROVIDER_SITE_OTHER): Payer: Self-pay | Admitting: Vascular Surgery

## 2016-08-13 VITALS — BP 136/84 | HR 80 | Resp 16 | Ht 63.0 in | Wt 157.0 lb

## 2016-08-13 DIAGNOSIS — I728 Aneurysm of other specified arteries: Secondary | ICD-10-CM | POA: Diagnosis not present

## 2016-08-13 DIAGNOSIS — E784 Other hyperlipidemia: Secondary | ICD-10-CM

## 2016-08-13 DIAGNOSIS — G4733 Obstructive sleep apnea (adult) (pediatric): Secondary | ICD-10-CM

## 2016-08-13 DIAGNOSIS — E785 Hyperlipidemia, unspecified: Secondary | ICD-10-CM

## 2016-08-13 NOTE — Progress Notes (Signed)
Subjective:    Patient ID: Cheryl LaineKathryn Ann [name], female    DOB: 02/18/1956, 61 y.o.   MRN: [id number] Chief Complaint  Patient presents with  . Re-evaluation    1 year mesenteric   Patient presents for a yearly mesenteric artery stenosis follow up. She was last seen in 07/2015 and underwent a CT scan of the abdomen. 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 Her splenic artery has remained stable today measuring 1.02cm x 1.18cm. This has been stable for two years. Today's ultrasound was also notable for elevated celiac velocities with patent SMA and IMA. The patient is without complaint and denies any symptoms such as post prandial pain, weight loss, fear of eating, nausea, vomiting, constipation or diarrhea.   Review of Systems  Constitutional: Negative.   HENT: Negative.   Eyes: Negative.   Respiratory: Negative.   Cardiovascular: Negative.   Gastrointestinal: Negative.   Endocrine: Negative.   Genitourinary: Negative.   Musculoskeletal: Negative.   Skin: Negative.   Allergic/Immunologic: Negative.   Neurological: Negative.   Hematological: Negative.   Psychiatric/Behavioral: Negative.       Objective:   Physical Exam  Constitutional: She is oriented to person, place, and time. She appears well-developed and well-nourished.  HENT:  Head: Normocephalic and atraumatic.  Right Ear: External ear normal.  Left Ear: External ear normal.  Eyes: Conjunctivae and EOM are normal. Pupils are equal, round, and reactive to light.  Neck: Normal range of motion.  Cardiovascular: Normal rate, regular rhythm and normal heart sounds.   Pulses:      Radial pulses are 2+ on the right side, and 2+ on the left side.  Pulmonary/Chest: Effort normal.  Abdominal: Soft.  Musculoskeletal: Normal range of motion.  Neurological: She is alert and oriented to person, place, and time. She has normal reflexes.  Skin: Skin is warm and dry.  Psychiatric: She has a normal mood  and affect. Her behavior is normal. Judgment and thought content normal.   BP 136/84 (BP Location: Right Arm)   Pulse 80   Resp 16   Ht 5\' 3"  (1.6 m)   Wt 157 lb (71.2 kg)   BMI 27.81 kg/m   Past Medical History:  Diagnosis Date  . 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 Arrhythmia    left bundle branch block  . Arthritis   . Blood in stool   . Chicken pox   . Generalized headaches   . History of blood transfusion   . LBBB (left bundle branch block)   . Thyroid disease   . UTI (lower urinary tract infection)    Social History   Social History  . Marital status: Married    Spouse name: N/A  . Number of children: N/A  . Years of education: N/A   Occupational History  . Not on file.   Social History Main Topics  . Smoking status: Former Smoker    Packs/day: 0.25    Years: 34.00    Types: Cigarettes    Quit date: 04/10/2013  . Smokeless tobacco: Never Used  . Alcohol use Yes     Comment: occasional  . Drug use: No  . Sexual activity: Not on file   Other Topics Concern  . Not on file   Social History Narrative   Married to [employer] [place]   Works for  [employer], Engineer, structural   Past Surgical History:  Procedure Laterality Date  . ABDOMINAL HYSTERECTOMY  1997  . APPENDECTOMY  1981  . BREAST BIOPSY Bilateral 1976   neg  . BREAST SURGERY  1976  . CARDIAC CATHETERIZATION  2004   [facility]  . CARDIAC CATHETERIZATION  2006   [facility]  . cystic fibrosis tumor removal  1983  . THUMB AMPUTATION  1992   traumatic  . TONSILLECTOMY AND ADENOIDECTOMY  1964   Family History  Problem Relation Age of Onset  . Heart disease Mother   . Arthritis Mother   . Cancer Mother     breast  . Hyperlipidemia Mother   . Hypertension Mother   . Heart attack Mother   . Breast cancer Mother 43  . Heart disease Father   . Cancer Father      hodgkins disease, prostate  . Diabetes Father   . Arthritis Father   . Hypertension Father   . Heart disease Sister   . Diabetes Sister   . Heart disease Brother 26    ami x 8,  4 vessel CABG   . Diabetes Brother   . Liver disease Brother   . Lung disease Brother   . Heart disease Maternal Grandmother   . Diabetes Maternal Grandmother   . Heart disease Maternal Grandfather   . Heart disease Paternal Grandmother   . Diabetes Paternal Grandmother   . Stroke Paternal Grandfather   . Heart disease Paternal Grandfather   . Diabetes Paternal Grandfather   . Diabetes Maternal Aunt    Allergies  Allergen Reactions  . Peanuts [Peanut Oil]   . Penicillins Other (See Comments)    Hives,rash,nausea,swelling,   . Sulfa Antibiotics Other (See Comments)    Hives,rash,nausea,swelling  . Influenza Vac Split [Flu Virus Vaccine]     Pleurisy  1996  . Mobic [Meloxicam] Nausea Only  . Nickel       Assessment & Plan:  Patient presents for a yearly mesenteric artery stenosis follow up. She was last seen in 07/2015 and underwent a CT scan of the abdomen. 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 Her splenic artery has remained stable today measuring 1.02cm x 1.18cm. This has been stable for two years. Today's ultrasound was also notable for elevated celiac velocities with patent SMA and IMA. The patient is without complaint and denies any symptoms such as post prandial pain, weight loss, fear of eating, nausea, vomiting, constipation or diarrhea.  1. Splenic artery aneurysm (HCC) - Stable No growth in size x two years. Will continue to follow on yearly basis.  Patient is asymptomatic. No intervention is indicated at this time.   2. Sleep apnea, obstructive - Stable Followed by respiratory.   3. Dyslipidemia (high LDL; low HDL) - Stable Encouraged good control as its slows the progression of atherosclerotic disease  Current Outpatient Prescriptions on File Prior  to Visit  Medication Sig Dispense Refill  . buPROPion (WELLBUTRIN SR) 100 MG 12 hr tablet Take 1 tablet by mouth two  times daily 180 tablet 2  . Calcium Carbonate-Vit D-Min (CALCIUM 1200 PO) Take 1 tablet by mouth daily.    . celecoxib (CELEBREX) 200 MG capsule TAKE 1 CAPSULE (200 MG TOTAL) BY MOUTH 2 (TWO) TIMES DAILY. 180 capsule 0  . Cholecalciferol (VITAMIN D PO) Take by mouth. Pt takes 5000iu daily    . CINNAMON PO Take 2,000 mg by mouth daily.    . clobetasol ointment (  TEMOVATE) 0.05 % Apply 1 application topically 2 (two) times daily. Until resolved (Patient taking differently: Apply 1 application topically daily. Until resolved) 30 g 2  . Cyanocobalamin (VITAMIN B 12 PO) Take by mouth.    . cyclobenzaprine (FLEXERIL) 5 MG tablet 1-2 TABLETS AT BEDTIME, CAN BE INCREASED TO TWICE A DAY AS NEEDED FOR MUSCLE SPASM  2  . Diclofenac Sodium 1.5 % SOLN Apply to painful joint twice daily 150 mL 3  . Docusate Calcium (STOOL SOFTENER PO) Take by mouth.    . EPINEPHrine (EPIPEN) 0.3 mg/0.3 mL DEVI Inject 0.3 mLs (0.3 mg total) into the muscle once. (Patient taking differently: Inject 0.3 mg into the muscle as needed. ) 1 Device 5  . HYDROcodone-acetaminophen (NORCO/VICODIN) 5-325 MG tablet Take 1 tablet by mouth at bedtime as needed. For pain 30 tablet 0  . hydrOXYzine (ATARAX/VISTARIL) 25 MG tablet TAKE 1 TABLET (25 MG TOTAL) BY MOUTH 3 (THREE) TIMES DAILY AS NEEDED FOR ITCHING. 90 tablet 3  . Lactulose 20 GM/30ML SOLN 30 ml every 4 hours until constipation is relieved 236 mL 3  . Multiple Vitamin (MULTIVITAMIN) tablet Take 1 tablet by mouth daily.    . nitroGLYCERIN (NITROSTAT) 0.4 MG SL tablet Place 1 tablet (0.4 mg total) under the tongue every 5 (five) minutes as needed for chest pain. 25 tablet 3  . Omega-3 Fatty Acids (FISH OIL) 1000 MG CAPS Take 1 capsule by mouth daily.    . progesterone (PROMETRIUM) 200 MG capsule Take 200 mg by mouth daily.    . propranolol (INDERAL) 10 MG tablet Take 1  tablet (10 mg total) by mouth 3 (three) times daily. 21 tablet 6  . traMADol (ULTRAM) 50 MG tablet Take 1 tablet (50 mg total) by mouth 2 (two) times daily. For chronic pain 60 tablet 5  . vitamin C (ASCORBIC ACID) 500 MG tablet Take 500 mg by mouth daily.    . Vitamin K, Phytonadione, 100 MCG TABS Take by mouth.     No current facility-administered medications on file prior to visit.     There are no Patient Instructions on file for this visit. No Follow-up on file.   Uzziel Russey A Zarayah Lanting, PA-C

## 2016-08-22 DIAGNOSIS — I5041 Acute combined systolic (congestive) and diastolic (congestive) heart failure: Secondary | ICD-10-CM | POA: Insufficient documentation

## 2016-08-22 DIAGNOSIS — I701 Atherosclerosis of renal artery: Secondary | ICD-10-CM | POA: Insufficient documentation

## 2016-08-22 DIAGNOSIS — I739 Peripheral vascular disease, unspecified: Secondary | ICD-10-CM | POA: Insufficient documentation

## 2016-08-22 NOTE — Progress Notes (Signed)
[facility] Office Note  Date:  08/24/2016   ID:  Cheryl [name], DOB 01-16-1956, MRN [id number]  PCP:  Sherlene Shams, MD   Chief Complaint  Patient presents with  . OTHER    LS 2016 no complaints today. Meds reviewed verbally with pt.    HPI:  Cheryl [name] is a 61 year old woman, patient of Dr. Darrick Huntsman, with  left bundle branch block,  41 year smoking history, stopped 2 years ago.  Diagnosis of sleep apnea, has chronic fatigue Previousstress testing, cardiac catheterization at [facility] with reportedly no significant CAD at that time in 2003, additional episodes of palpitations and chest pain,  Prior episodes of chest pain,workup in June 2012 with stress testing/Myoview showing no ischemia  Moderate aortic atherosclerosis who presents for follow-up of her palpitations, PAD.  Rare episodes of tachycardia, once per month. Prior chest pain symptoms felt to be atypical in nature. Strong family history of CAD  In follow-up she reports that she is doing relatively well Denies having significant palpitations, takes propranolol as needed for breakthrough symptoms  We discussed recent ultrasound  Results fromMarch 2018 by Dr. 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   Stress test 11/10/2010 showing no ischemia. 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 2017); Arrhythmia; Arthritis; Blood in stool; Chicken pox; Generalized headaches; History of blood transfusion; LBBB (left bundle branch block); Thyroid disease; and UTI (lower urinary tract infection).  PSH:    Past Surgical History:  Procedure Laterality Date  . ABDOMINAL HYSTERECTOMY  1997  . APPENDECTOMY  1981  . BREAST BIOPSY Bilateral 1976   neg  . BREAST SURGERY  1976  . CARDIAC CATHETERIZATION  2004   [facility]  . CARDIAC CATHETERIZATION  2006   [facility]  . cystic fibrosis tumor removal  1983  . THUMB AMPUTATION  1992   traumatic  . TONSILLECTOMY AND ADENOIDECTOMY  1964    Current Outpatient Prescriptions  Medication Sig Dispense Refill  . buPROPion (WELLBUTRIN SR) 100 MG 12 hr tablet Take 1 tablet by mouth two  times daily 180 tablet 2  . Calcium Carbonate-Vit D-Min (CALCIUM 1200 PO) Take 1 tablet by mouth daily.    . celecoxib (CELEBREX) 200 MG capsule TAKE 1 CAPSULE (200 MG TOTAL) BY MOUTH 2 (TWO) TIMES DAILY. 180 capsule 0  . Cholecalciferol (VITAMIN D PO) Take by mouth. Pt takes 5000iu daily    . CINNAMON PO Take 2,000 mg by mouth daily.    . clobetasol ointment (TEMOVATE) 0.05 % Apply 1 application topically 2 (two) times daily. Until resolved (Patient taking differently: Apply 1 application topically daily. Until resolved) 30 g 2  . Cyanocobalamin (VITAMIN B 12 PO) Take by mouth.    . cyclobenzaprine (FLEXERIL) 5 MG tablet 1-2 TABLETS AT BEDTIME, CAN BE INCREASED TO TWICE A DAY AS NEEDED FOR MUSCLE SPASM  2  . Diclofenac Sodium 1.5 %  SOLN Apply to painful joint twice daily 150 mL 3  . Docusate Calcium (STOOL SOFTENER PO) Take by mouth.    . EPINEPHrine (EPIPEN) 0.3 mg/0.3 mL DEVI Inject 0.3 mLs (0.3 mg total) into the muscle once. (Patient taking differently: Inject 0.3 mg into the muscle as needed. ) 1 Device 5  . HYDROcodone-acetaminophen (NORCO/VICODIN) 5-325 MG tablet Take 1 tablet by mouth at bedtime as needed.  For pain 30 tablet 0  . hydrOXYzine (ATARAX/VISTARIL) 25 MG tablet TAKE 1 TABLET (25 MG TOTAL) BY MOUTH 3 (THREE) TIMES DAILY AS NEEDED FOR ITCHING. 90 tablet 3  . Lactulose 20 GM/30ML SOLN 30 ml every 4 hours until constipation is relieved 236 mL 3  . Multiple Vitamin (MULTIVITAMIN) tablet Take 1 tablet by mouth daily.    . nitroGLYCERIN (NITROSTAT) 0.4 MG SL tablet Place 1 tablet (0.4 mg total) under the tongue every 5 (five) minutes as needed for chest pain. 25 tablet 3  . Omega-3 Fatty Acids (FISH OIL) 1000 MG CAPS Take 1 capsule by mouth daily.    . progesterone (PROMETRIUM) 200 MG capsule Take 200 mg by mouth daily.    . propranolol (INDERAL) 10 MG tablet Take 1 tablet (10 mg total) by mouth 3 (three) times daily. 21 tablet 6  . traMADol (ULTRAM) 50 MG tablet Take 1 tablet (50 mg total) by mouth 2 (two) times daily. For chronic pain 60 tablet 5  . vitamin C (ASCORBIC ACID) 500 MG tablet Take 500 mg by mouth daily.    . Vitamin K, Phytonadione, 100 MCG TABS Take by mouth.     No current facility-administered medications for this visit.      Allergies:   Peanuts [peanut oil]; Penicillins; Sulfa antibiotics; Influenza vac split [flu virus vaccine]; Mobic [meloxicam]; and Nickel   Social History:  The patient  reports that she quit smoking about 3 years ago. Her smoking use included Cigarettes. She has a 8.50 pack-year smoking history. She has never used smokeless tobacco. She reports that she drinks alcohol. She reports that she does not use drugs.   Family History:   family history includes Arthritis in her father and mother; Breast cancer (age of onset: 5344) in her mother; Cancer in her father and mother; Diabetes in her brother, father, maternal aunt, maternal grandmother, paternal grandfather, paternal grandmother, and sister; Heart attack in her mother; Heart disease in her father, maternal grandfather, maternal grandmother, mother, paternal grandfather, paternal grandmother, and sister;  Heart disease (age of onset: 4659) in her brother; Hyperlipidemia in her mother; Hypertension in her father and mother; Liver disease in her brother; Lung disease in her brother; Stroke in her paternal grandfather.    Review of Systems: Review of Systems  Constitutional: Negative.   Respiratory: Negative.   Cardiovascular: Negative.   Gastrointestinal: Negative.   Musculoskeletal: Negative.   Neurological: Negative.   Psychiatric/Behavioral: Negative.   All other systems reviewed and are negative.    PHYSICAL EXAM: VS:  BP 124/80 (BP Location: Left Arm, Patient Position: Sitting, Cuff Size: Normal)   Pulse 88   Ht 5\' 3"  (1.6 m)   Wt 154 lb 8 oz (70.1 kg)   BMI 27.37 kg/m  , BMI Body mass index is 27.37 kg/m. GEN: Well nourished, well developed, in no acute distress  HEENT: normal  Neck: no JVD, carotid bruits, or masses Cardiac: RRR; no murmurs, rubs, or gallops,no edema  Respiratory:  clear to auscultation bilaterally, normal work of breathing GI: soft, nontender, nondistended, + BS  MS: no deformity or atrophy  Skin: warm and dry, no rash Neuro:  Strength and sensation are intact Psych: euthymic mood, full affect    Recent Labs: 10/26/2015: TSH 0.915 03/27/2016: ALT 19; BUN 17; Creatinine, Ser 1.03; Potassium 4.5; Sodium 139 06/01/2016: Hemoglobin 10.2; Platelets 276    Lipid Panel Lab Results  Component Value Date   CHOL 152 10/26/2015   HDL 32 (L) 10/26/2015   LDLCALC 98 10/26/2015   TRIG 109 10/26/2015      Wt Readings from Last 3 Encounters:  08/24/16 154 lb 8 oz (70.1 kg)  08/13/16 157 lb (71.2 kg)  06/08/16 163 lb 6 oz (74.1 kg)       ASSESSMENT AND PLAN:  Mixed hyperlipidemia Cholesterol is well controlled on no medication She has declined adding statin at this time  History of tobacco abuse Stopped smoking in the past Long smoking history likely contributing to her peripheral arterial disease  Sleep apnea, obstructive Compliant with her  CPAP  Aortic atherosclerosis (HCC) Images from CT scan discussed with her She is nondiabetic, no longer smoking, cholesterol 130s on no statin LDL around 70 We did discuss starting a low-dose statin given diffuse PAD She has declined at this time  Celiac artery stenosis (HCC) Noted on ultrasound but interestingly not visible on CT scan abdomen 2016 Recommended she discuss this with vascular surgery. On CT scan,Ostium of the celiac artery and SMA does not have significant calcification and appears patent  Palpitations Denies having significant symptoms, takes propranolol for breakthrough episodes  Disposition:   F/U  12 months   Total encounter time more than 25 minutes  Greater than 50% was spent in counseling and coordination of care with the patient   No orders of the defined types were placed in this encounter.    Signed, Dossie Arbour, M.D., Ph.D. 08/24/2016  [facility] [place], [place] [phone]

## 2016-08-24 ENCOUNTER — Ambulatory Visit (INDEPENDENT_AMBULATORY_CARE_PROVIDER_SITE_OTHER): Payer: 59 | Admitting: Cardiovascular Disease

## 2016-08-24 ENCOUNTER — Encounter: Payer: Self-pay | Admitting: Cardiovascular Disease

## 2016-08-24 VITALS — BP 124/80 | HR 88 | Ht 63.0 in | Wt 154.5 lb

## 2016-08-24 DIAGNOSIS — E782 Mixed hyperlipidemia: Secondary | ICD-10-CM | POA: Diagnosis not present

## 2016-08-24 DIAGNOSIS — Z87891 Personal history of nicotine dependence: Secondary | ICD-10-CM

## 2016-08-24 DIAGNOSIS — I700 Atherosclerosis of aorta: Secondary | ICD-10-CM

## 2016-08-24 DIAGNOSIS — R002 Palpitations: Secondary | ICD-10-CM

## 2016-08-24 DIAGNOSIS — G4733 Obstructive sleep apnea (adult) (pediatric): Secondary | ICD-10-CM | POA: Diagnosis not present

## 2016-08-24 DIAGNOSIS — I774 Celiac artery compression syndrome: Secondary | ICD-10-CM

## 2016-08-24 DIAGNOSIS — I771 Stricture of artery: Secondary | ICD-10-CM

## 2016-08-24 NOTE — Patient Instructions (Signed)

## 2016-08-31 ENCOUNTER — Encounter: Payer: Self-pay | Admitting: Internal Medicine

## 2016-08-31 ENCOUNTER — Ambulatory Visit (INDEPENDENT_AMBULATORY_CARE_PROVIDER_SITE_OTHER): Payer: 59 | Admitting: Internal Medicine

## 2016-08-31 VITALS — BP 132/82 | HR 94 | Temp 98.2°F | Resp 16 | Ht 63.0 in | Wt 158.2 lb

## 2016-08-31 DIAGNOSIS — K580 Irritable bowel syndrome with diarrhea: Secondary | ICD-10-CM

## 2016-08-31 DIAGNOSIS — M7552 Bursitis of left shoulder: Secondary | ICD-10-CM

## 2016-08-31 DIAGNOSIS — A09 Infectious gastroenteritis and colitis, unspecified: Secondary | ICD-10-CM | POA: Diagnosis not present

## 2016-08-31 DIAGNOSIS — R197 Diarrhea, unspecified: Secondary | ICD-10-CM

## 2016-08-31 DIAGNOSIS — E063 Autoimmune thyroiditis: Secondary | ICD-10-CM

## 2016-08-31 DIAGNOSIS — D563 Thalassemia minor: Secondary | ICD-10-CM

## 2016-08-31 DIAGNOSIS — K5730 Diverticulosis of large intestine without perforation or abscess without bleeding: Secondary | ICD-10-CM

## 2016-08-31 DIAGNOSIS — M19212 Secondary osteoarthritis, left shoulder: Secondary | ICD-10-CM

## 2016-08-31 DIAGNOSIS — M19211 Secondary osteoarthritis, right shoulder: Secondary | ICD-10-CM | POA: Diagnosis not present

## 2016-08-31 DIAGNOSIS — M545 Low back pain, unspecified: Secondary | ICD-10-CM

## 2016-08-31 DIAGNOSIS — G8929 Other chronic pain: Secondary | ICD-10-CM

## 2016-08-31 LAB — COMPREHENSIVE METABOLIC PANEL
ALT: 18 U/L (ref 0–35)
AST: 18 U/L (ref 0–37)
Albumin: 4.4 g/dL (ref 3.5–5.2)
Alkaline Phosphatase: 68 U/L (ref 39–117)
BUN: 12 mg/dL (ref 6–23)
CO2: 27 mEq/L (ref 19–32)
Calcium: 9.3 mg/dL (ref 8.4–10.5)
Chloride: 107 mEq/L (ref 96–112)
Creatinine, Ser: 1.03 mg/dL (ref 0.40–1.20)
GFR: 57.87 mL/min — ABNORMAL LOW (ref >60.00)
Glucose, Bld: 89 mg/dL (ref 70–99)
Potassium: 4.2 mEq/L (ref 3.5–5.1)
Sodium: 138 mEq/L (ref 135–145)
Total Bilirubin: 0.8 mg/dL (ref 0.2–1.2)
Total Protein: 6.7 g/dL (ref 6.0–8.3)

## 2016-08-31 LAB — SEDIMENTATION RATE: Sed Rate: 11 mm/hr (ref 0–30)

## 2016-08-31 MED ORDER — DICYCLOMINE HCL 20 MG PO TABS: 20.0000 mg | ORAL_TABLET | Freq: Three times a day (TID) | ORAL | 2 refills | Status: DC

## 2016-08-31 MED ORDER — HYDROCODONE-ACETAMINOPHEN 5-325 MG PO TABS: 1.0000 | ORAL_TABLET | Freq: Every evening | ORAL | 0 refills | Status: DC | PRN

## 2016-08-31 MED ORDER — OMEPRAZOLE 40 MG PO CPDR: 40.0000 mg | DELAYED_RELEASE_CAPSULE | Freq: Every day | ORAL | 3 refills | Status: DC

## 2016-08-31 NOTE — Progress Notes (Signed)
Subjective:  Patient ID: Cheryl [name], female    DOB: 1956/02/01  Age: 61 y.o. MRN: [id number]  CC: The primary encounter diagnosis was Diarrhea of presumed infectious origin. 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  HPI Cheryl [name] presents for follow up on multiple issues:  1) medication refill/pain management.    Patient has chronic low back pain and OA managed with celebrex  And tramadol for daytime pain and vicodin at night due to previous trial of tramadol causing increased agitation   2) Fatigue her energy has  improved with iron infusions ,  Next follow up in July .  3) Back pain controlled  But now having Left shoulder pain due to arthtritis and  Bursitis.   She was seeing sports medicine but  Had a prolonged painful reaction to intrarticular steroid injection .  Doing exercises , using  voltaren gel.    4) Has been having increased loose stools and periumbilical pain relieved with stooling .  Pain starts an hour or so after eating and cramping is severe.   Thinks it's diverticultitis,  But denies fevers nad LLQ pain.  And notes that the symptoms resolve  AFTER ONE TO 2 WATERY BOWEL MOVEMENTS.  No recent use of antibiotics.   Refill history confirmed via [place] Controlled Substance databas, accessed by me today.[name]  Refills for April may and June given   Outpatient Medications Prior to Visit  Medication Sig Dispense Refill  . buPROPion (WELLBUTRIN SR) 100 MG 12 hr tablet Take 1 tablet by mouth two  times daily 180 tablet 2  . Calcium Carbonate-Vit D-Min (CALCIUM 1200 PO) Take 1 tablet by mouth daily.    . celecoxib (CELEBREX) 200 MG capsule TAKE 1 CAPSULE  (200 MG TOTAL) BY MOUTH 2 (TWO) TIMES DAILY. 180 capsule 0  . Cholecalciferol (VITAMIN D PO) Take by mouth. Pt takes 5000iu daily    . clobetasol ointment (TEMOVATE) 0.81 % Apply 1 application topically 2 (two) times daily. Until resolved (Patient taking differently: Apply 1 application topically daily. Until resolved) 30 g 2  . Cyanocobalamin (VITAMIN B 12 PO) Take by mouth.    . cyclobenzaprine (FLEXERIL) 5 MG tablet 1-2 TABLETS AT BEDTIME, CAN BE INCREASED TO TWICE A DAY AS NEEDED FOR MUSCLE SPASM  2  . Diclofenac Sodium 1.5 % SOLN Apply to painful joint twice daily 150 mL 3  . Docusate Calcium (STOOL SOFTENER PO) Take by mouth.    . EPINEPHrine (EPIPEN) 0.3 mg/0.3 mL DEVI Inject 0.3 mLs (0.3 mg total) into the muscle once. (Patient taking differently: Inject 0.3 mg into the muscle as needed. ) 1 Device 5  . hydrOXYzine (ATARAX/VISTARIL) 25 MG tablet TAKE 1 TABLET (25 MG TOTAL) BY MOUTH 3 (THREE) TIMES DAILY AS NEEDED FOR ITCHING. 90 tablet 3  . Lactulose 20 GM/30ML SOLN 30 ml every 4 hours until constipation is relieved 236 mL 3  . nitroGLYCERIN (NITROSTAT) 0.4 MG SL tablet Place 1 tablet (0.4 mg total) under the tongue every 5 (five) minutes as needed for chest pain. 25 tablet 3  . Omega-3 Fatty Acids (FISH OIL) 1000 MG CAPS Take 1 capsule by mouth daily.    . progesterone (PROMETRIUM) 200 MG capsule Take 200 mg by mouth daily.    [name]  propranolol (INDERAL) 10 MG tablet Take 1 tablet (10 mg total) by mouth 3 (three) times daily. 21 tablet 6  . traMADol (ULTRAM) 50 MG tablet Take 1 tablet (50 mg total) by mouth 2 (two) times daily. For chronic pain 60 tablet 5  . vitamin C (ASCORBIC ACID) 500 MG tablet Take 500 mg by mouth daily.    [name] HYDROcodone-acetaminophen (NORCO/VICODIN) 5-325 MG tablet Take 1 tablet by mouth at bedtime as needed. For pain 30 tablet 0  . CINNAMON PO Take 2,000 mg by mouth daily.    . Multiple Vitamin (MULTIVITAMIN) tablet Take 1 tablet by mouth daily.    . Vitamin K,  Phytonadione, 100 MCG TABS Take by mouth.     No facility-administered medications prior to visit.     Review of Systems;  Patient denies headache, fevers, malaise, unintentional weight loss, skin rash, eye pain, sinus congestion and sinus pain, sore throat, dysphagia,  hemoptysis , cough, dyspnea, wheezing, chest pain, palpitations, orthopnea, edema, abdominal pain, nausea, melena, diarrhea, constipation, flank pain, dysuria, hematuria, urinary  Frequency, nocturia, numbness, tingling, seizures,  Focal weakness, Loss of consciousness,  Tremor, insomnia, depression, anxiety, and suicidal ideation.      Objective:  BP 132/82   Pulse 94   Temp 98.2 F (36.8 C) (Oral)   Resp 16   Ht _0  (1.6 m)   Wt 158 lb 3.2 oz (71.8 kg)   SpO2 94%   BMI 28.02 kg/m   BP Readings from Last 3 Encounters:  08/31/16 132/82  08/24/16 124/80  08/13/16 136/84    Wt Readings from Last 3 Encounters:  08/31/16 158 lb 3.2 oz (71.8 kg)  08/24/16 154 lb 8 oz (70.1 kg)  08/13/16 157 lb (71.2 kg)    General appearance: alert, cooperative and appears stated age Ears: normal TM's and external ear canals both ears Throat: lips, mucosa, and tongue normal; teeth and gums normal Neck: no adenopathy, no carotid bruit, supple, symmetrical, trachea midline and thyroid not enlarged, symmetric, no tenderness/mass/nodules Back: symmetric, no curvature. ROM normal. No CVA tenderness. Lungs: clear to auscultation bilaterally Heart: regular rate and rhythm, S1, S2 normal, no murmur, click, rub or gallop Abdomen: soft, non-tender; bowel sounds normal; no masses,  no organomegaly Pulses: 2+ and symmetric Skin: Skin color, texture, turgor normal. No rashes or lesions Lymph nodes: Cervical, supraclavicular, and axillary nodes normal.  Lab Results  Component Value Date   HGBA1C 5.1 10/26/2015   HGBA1C 5.4 11/25/2013    Lab Results  Component Value Date   CREATININE 1.03 08/31/2016   CREATININE 1.03 (H)  03/27/2016   CREATININE 0.85 11/15/2015    Lab Results  Component Value Date   WBC 11.3 (H) 06/01/2016   HGB 10.2 (L) 06/01/2016   HCT 31.9 (L) 06/01/2016   PLT 276 06/01/2016   GLUCOSE 89 08/31/2016   CHOL 152 10/26/2015   TRIG 109 10/26/2015   HDL 32 (L) 10/26/2015   LDLDIRECT 104 (H) 08/15/2012   LDLCALC 98 10/26/2015   ALT 18 08/31/2016   AST 18 08/31/2016   NA 138 08/31/2016   K 4.2 08/31/2016   CL 107 08/31/2016   CREATININE 1.03 08/31/2016   BUN 12 08/31/2016   CO2 27 08/31/2016   TSH 0.915 10/26/2015   INR 1.0 09/01/2014   HGBA1C 5.1 10/26/2015    Mr Lumbar Spine Wo Contrast  Result Date: 06/10/2016 CLINICAL DATA:  Low back pain and right leg pain EXAM: MRI LUMBAR SPINE WITHOUT CONTRAST TECHNIQUE: Multiplanar, multisequence  MR imaging of the lumbar spine was performed. No intravenous contrast was administered. COMPARISON:  None. FINDINGS: Segmentation:  Normal Alignment:  Normal Vertebrae: There is diffusely heterogeneous bone marrow signal throughout the lumbar spine and sacrum. No acute progression fracture, evidence of discitis osteomyelitis or facet edema. Conus medullaris: Extends to the upper L2 level and appears normal. Paraspinal and other soft tissues: The visualized aorta, IVC and iliac vessels are normal. The visualized retroperitoneal organs and paraspinal soft tissues are normal. Disc levels: T12-L1: Normal disc space facets. No spinal canal or neural foraminal stenosis. L1-L2: Normal disc space and facets. No spinal canal or neuroforaminal stenosis. L2-L3: Normal disc space and facets. No spinal canal or neuroforaminal stenosis. L3-L4: Normal disc space and facets. No spinal canal or neuroforaminal stenosis. L4-L5: Disc desiccation with left eccentric bulge. No spinal canal stenosis. Mild left foraminal narrowing.: L5-S1: Small central disc protrusion. No spinal canal or neural foraminal stenosis. IMPRESSION: 1. Diffusely heterogeneous bone marrow signal  intensity. This may be seen in the setting of chronic anemia or in smokers; however, the appearance is also compatible with marrow replacement disorders, including multiple myeloma. Recommend correlation with laboratory studies. 2. Mild lower lumbar degenerative disc disease with mild left foraminal narrowing at L4-L5. 3. No findings to explain the reported right lower extremity symptoms. Electronically Signed   By: Ulyses Jarred M.D.   On: 06/10/2016 01:12    Assessment & Plan:   Problem List Items Addressed This Visit    Acute shoulder bursitis, left    Rheumatologic evaluation reviewed.  No signs of inflammatory arthritis.  Recent shoulder injection was not tolerated per patient.       Chronic lower back pain    RI of lumbar spine SHOWED NO SPINAL STENOSIS.  Previously managed with bid vicodin , which I have now reduced to once daily at bedtime only. continue celebrex and tramadol for  daytime use.   Refill history confirmed via [place] Controlled Substance databas, accessed by me today.   Refills for April, May, June given      Relevant Medications   HYDROcodone-acetaminophen (NORCO/VICODIN) 5-325 MG tablet   Diverticulosis of colon    Noted on 2014 screening colonoscopy by Merry Proud byrnett. High fiber diet recommended,       Irritable bowel syndrome    Current symptoms without weight loss and with normal esr sugget IBS. Bentyl prescribed.       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 Clugston's CINNAMON PO, multivitamin, and Vitamin K (Phytonadione). I am also having her start on dicyclomine and omeprazole. Additionally, I am having her maintain her Cholecalciferol (VITAMIN D PO), Calcium Carbonate-Vit D-Min (CALCIUM 1200 PO), vitamin C, Fish Oil, progesterone, Docusate Calcium (STOOL SOFTENER PO), EPINEPHrine, Cyanocobalamin (  VITAMIN B 12 PO), hydrOXYzine, clobetasol ointment, Diclofenac Sodium, nitroGLYCERIN, propranolol, buPROPion, Lactulose, cyclobenzaprine, traMADol, celecoxib, and HYDROcodone-acetaminophen.  Meds ordered this encounter  Medications  . dicyclomine (BENTYL) 20 MG tablet    Sig: Take 1 tablet (20 mg total) by mouth 3 (three) times daily before meals.    Dispense:  120 tablet    Refill:  2  . omeprazole (PRILOSEC) 40 MG capsule    Sig: Take 1 capsule (40 mg total) by mouth daily.    Dispense:  30 capsule    Refill:  3  . DISCONTD: HYDROcodone-acetaminophen (NORCO/VICODIN) 5-325 MG tablet    Sig: Take 1 tablet by mouth at bedtime as needed. For pain    Dispense:  30 tablet    Refill:  0    May refiill on or after  August 31 2016  . DISCONTD: HYDROcodone-acetaminophen (NORCO/VICODIN) 5-325 MG tablet    Sig: Take 1 tablet by mouth at bedtime as needed. For pain    Dispense:  30 tablet    Refill:  0    May refiill on or after  Sep 30 2016  . HYDROcodone-acetaminophen (NORCO/VICODIN) 5-325 MG tablet    Sig: Take 1 tablet by mouth at bedtime as needed. For pain    Dispense:  30 tablet    Refill:  0    May refiill on or after  October 31 2016    Medications Discontinued During This Encounter  Medication Reason  . CINNAMON PO Patient has not taken in last 30 days  . Multiple Vitamin (MULTIVITAMIN) tablet Patient has not taken in last 30 days  . Vitamin K, Phytonadione, 100  MCG TABS Patient has not taken in last 30 days  . HYDROcodone-acetaminophen (NORCO/VICODIN) 5-325 MG tablet Reorder  . HYDROcodone-acetaminophen (NORCO/VICODIN) 5-325 MG tablet Reorder  . HYDROcodone-acetaminophen (NORCO/VICODIN) 5-325 MG tablet Reorder    Follow-up: Return in about 3 months (around 11/30/2016), or PRIOR TO JULY 6 .   Crecencio Mc, MD

## 2016-08-31 NOTE — Patient Instructions (Addendum)
I WANT YOU TO START TAKING OMEPRAZOLE DAILY FOR STOMACH PROTECTION,  TAKE ON AN EMPTY STOMACH    TRIAL OF DICYCLOMINE , an antispasmodic ,  To prevent pain after eating..  This is a treatment for IBS  Simplify diet to clear liquids when you have an attack.  I have refilled your vicodin for April , May and June

## 2016-08-31 NOTE — Progress Notes (Signed)
Pre visit review using our clinic review tool, if applicable. No additional management support is needed unless otherwise documented below in the visit note. 

## 2016-09-02 ENCOUNTER — Encounter: Payer: Self-pay | Admitting: Internal Medicine

## 2016-09-02 DIAGNOSIS — K5730 Diverticulosis of large intestine without perforation or abscess without bleeding: Secondary | ICD-10-CM | POA: Insufficient documentation

## 2016-09-02 NOTE — Assessment & Plan Note (Signed)
Rheumatologic evaluation reviewed.  No signs of inflammatory arthritis.  Recent shoulder injection was not tolerated per patient.

## 2016-09-02 NOTE — Assessment & Plan Note (Addendum)
Lab Results  Component Value Date   TSH 0.915 10/26/2015   Occurred after viral infection and resolved spontaneously

## 2016-09-02 NOTE — Assessment & Plan Note (Addendum)
RI of lumbar spine SHOWED NO SPINAL STENOSIS.  Previously managed with bid vicodin , which I have now reduced to once daily at bedtime only. continue celebrex and tramadol for  daytime use.   Refill history confirmed via [place] Controlled Substance databas, accessed by me today.   Refills for April, May, June given

## 2016-09-02 NOTE — Assessment & Plan Note (Signed)
Managed now by Hematology,  Has received iron infusions

## 2016-09-02 NOTE — Assessment & Plan Note (Signed)
Current symptoms without weight loss and with normal esr sugget IBS. Bentyl prescribed.

## 2016-09-02 NOTE — Assessment & Plan Note (Signed)
Noted on 2014 screening colonoscopy by Trey Paula byrnett. High fiber diet recommended,

## 2016-09-03 ENCOUNTER — Encounter: Payer: Self-pay | Admitting: Internal Medicine

## 2016-09-09 ENCOUNTER — Telehealth: Payer: Self-pay | Admitting: Internal Medicine

## 2016-09-17 NOTE — Telephone Encounter (Signed)
Mailed unread message to patient.  

## 2016-09-30 ENCOUNTER — Encounter: Payer: Self-pay | Admitting: Internal Medicine

## 2016-10-01 ENCOUNTER — Other Ambulatory Visit: Payer: Self-pay | Admitting: Internal Medicine

## 2016-10-01 DIAGNOSIS — I771 Stricture of artery: Secondary | ICD-10-CM

## 2016-10-01 DIAGNOSIS — I774 Celiac artery compression syndrome: Secondary | ICD-10-CM

## 2016-10-04 ENCOUNTER — Ambulatory Visit (INDEPENDENT_AMBULATORY_CARE_PROVIDER_SITE_OTHER): Payer: 59 | Admitting: Vascular Surgery

## 2016-10-04 ENCOUNTER — Encounter (INDEPENDENT_AMBULATORY_CARE_PROVIDER_SITE_OTHER): Payer: Self-pay | Admitting: Vascular Surgery

## 2016-10-04 VITALS — BP 142/77 | HR 91 | Resp 16 | Wt 159.0 lb

## 2016-10-04 DIAGNOSIS — I739 Peripheral vascular disease, unspecified: Secondary | ICD-10-CM | POA: Diagnosis not present

## 2016-10-04 DIAGNOSIS — I728 Aneurysm of other specified arteries: Secondary | ICD-10-CM | POA: Diagnosis not present

## 2016-10-04 DIAGNOSIS — E782 Mixed hyperlipidemia: Secondary | ICD-10-CM | POA: Diagnosis not present

## 2016-10-04 DIAGNOSIS — M2550 Pain in unspecified joint: Secondary | ICD-10-CM

## 2016-10-04 DIAGNOSIS — R109 Unspecified abdominal pain: Secondary | ICD-10-CM | POA: Diagnosis not present

## 2016-10-05 NOTE — Progress Notes (Signed)
MRN : [id number]  Cheryl [name] is a 61 y.o. (01/13/1956) female who presents with chief complaint of  Chief Complaint  Patient presents with  . Follow-up  .  History of Present Illness: The patient is seen sooner than expected referred back by Dr. Darrick Huntsmanullo.  She continues to have epigastric pain which is intermittent and not necessarily postprandial. It seems to be associated with episodes of watery diarrhea. No blood. 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 She also has significant difficulties with constipation. She also notes that if she eats dairy products she has severe cramps as well as diarrhea. She has never been diagnosed is lactose intolerant.  Current Meds  Medication Sig  . buPROPion (WELLBUTRIN SR) 100 MG 12 hr tablet Take 1 tablet by mouth two  times daily  . Calcium Carbonate-Vit D-Min (CALCIUM 1200 PO) Take 1 tablet by mouth daily.  . celecoxib (CELEBREX) 200 MG capsule TAKE 1 CAPSULE (200 MG TOTAL) BY MOUTH 2 (TWO) TIMES DAILY.  [name]. Cholecalciferol (VITAMIN D PO) Take by mouth. Pt takes 5000iu daily  . clobetasol ointment (TEMOVATE) 0.05 % Apply 1 application topically 2 (two) times daily. Until resolved (Patient taking differently: Apply 1 application topically daily. Until resolved)  . Cyanocobalamin (VITAMIN B 12 PO) Take by mouth.  . cyclobenzaprine (FLEXERIL) 5 MG tablet 1-2 TABLETS AT BEDTIME, CAN BE INCREASED TO TWICE A DAY AS NEEDED FOR MUSCLE SPASM  . Diclofenac Sodium 1.5 % SOLN Apply to painful joint twice daily  . dicyclomine (BENTYL) 20 MG tablet Take 1 tablet (20 mg total) by mouth 3 (three) times daily before meals.  Tery Sanfilippo. Docusate Calcium (STOOL SOFTENER PO) Take by mouth.  . EPINEPHrine (EPIPEN) 0.3 mg/0.3 mL DEVI Inject 0.3 mLs (0.3 mg total) into the muscle once. (Patient taking differently: Inject 0.3 mg into the muscle as  needed. )  . HYDROcodone-acetaminophen (NORCO/VICODIN) 5-325 MG tablet Take 1 tablet by mouth at bedtime as needed. For pain  . hydrOXYzine (ATARAX/VISTARIL) 25 MG tablet TAKE 1 TABLET (25 MG TOTAL) BY MOUTH 3 (THREE) TIMES DAILY AS NEEDED FOR ITCHING.  . Lactulose 20 GM/30ML SOLN 30 ml every 4 hours until constipation is relieved  . nitroGLYCERIN (NITROSTAT) 0.4 MG SL tablet Place 1 tablet (0.4 mg total) under the tongue every 5 (five) minutes as needed for chest pain.  . Omega-3 Fatty Acids (FISH OIL) 1000 MG CAPS Take 1 capsule by mouth daily.  [name]. omeprazole (PRILOSEC) 40 MG capsule Take 1 capsule (40 mg total) by mouth daily.  . progesterone (PROMETRIUM) 200 MG capsule Take 200 mg by mouth daily.  . propranolol (INDERAL) 10 MG tablet Take 1 tablet (10 mg total) by mouth 3 (three) times daily.  . traMADol (ULTRAM) 50 MG tablet Take 1 tablet (50 mg total) by mouth 2 (two) times daily. For chronic pain  . vitamin C (ASCORBIC ACID) 500 MG tablet Take 500 mg by mouth daily.    Past Medical History:  Diagnosis Date  . 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 Arrhythmia    left bundle branch block  . Arthritis   . Blood in stool   . Chicken pox   . Generalized headaches   . History of blood transfusion   .  LBBB (left bundle branch block)   . Thyroid disease   . UTI (lower urinary tract infection)     Past Surgical History:  Procedure Laterality Date  . ABDOMINAL HYSTERECTOMY  1997  . APPENDECTOMY  1981  . BREAST BIOPSY Bilateral 1976   neg  . BREAST SURGERY  1976  . CARDIAC CATHETERIZATION  2004   [facility]  . CARDIAC CATHETERIZATION  2006   [facility]  . cystic fibrosis tumor removal  1983  . THUMB AMPUTATION  1992   traumatic  . TONSILLECTOMY AND ADENOIDECTOMY  1964    Social History Social History  Substance Use Topics  . Smoking status: Former  Smoker    Packs/day: 0.25    Years: 34.00    Types: Cigarettes    Quit date: 04/10/2013  . Smokeless tobacco: Never Used  . Alcohol use Yes     Comment: occasional    Family History Family History  Problem Relation Age of Onset  . Heart disease Mother   . Arthritis Mother   . Cancer Mother        breast  . Hyperlipidemia Mother   . Hypertension Mother   . Heart attack Mother   . Breast cancer Mother 1  . Heart disease Father   . Cancer Father        hodgkins disease, prostate  . Diabetes Father   . Arthritis Father   . Hypertension Father   . Heart disease Sister   . Diabetes Sister   . Heart disease Brother 74       ami x 8,  4 vessel CABG   . Diabetes Brother   . Liver disease Brother   . Lung disease Brother   . Heart disease Maternal Grandmother   . Diabetes Maternal Grandmother   . Heart disease Maternal Grandfather   . Heart disease Paternal Grandmother   . Diabetes Paternal Grandmother   . Stroke Paternal Grandfather   . Heart disease Paternal Grandfather   . Diabetes Paternal Grandfather   . Diabetes Maternal Aunt     Allergies  Allergen Reactions  . Peanuts [Peanut Oil]   . Penicillins Other (See Comments)    Hives,rash,nausea,swelling,   . Sulfa Antibiotics Other (See Comments)    Hives,rash,nausea,swelling  . Influenza Vac Split [Flu Virus Vaccine]     Pleurisy  1996  . Mobic [Meloxicam] Nausea Only  . Nickel      REVIEW OF SYSTEMS (Negative unless checked)  Constitutional: [] Weight loss  [] Fever  [] Chills Cardiac: [] Chest pain   [] Chest pressure   [] Palpitations   [] Shortness of breath when laying flat   [] Shortness of breath with exertion. Vascular:  [] Pain in legs with walking   [] Pain in legs at rest  [] History of DVT   [] Phlebitis   [] Swelling in legs   [] Varicose veins   [] Non-healing ulcers Pulmonary:   [] Uses home oxygen   [] Productive cough   [] Hemoptysis   [] Wheeze  [] COPD   [] Asthma Neurologic:  [] Dizziness   [] Seizures    [] History of stroke   [] History of TIA  [] Aphasia   [] Vissual changes   [] Weakness or numbness in arm   [] Weakness or numbness in leg Musculoskeletal:   [] Joint swelling   [] Joint pain   [] Low back pain Hematologic:  [] Easy bruising  [] Easy bleeding   [] Hypercoagulable state   [] Anemic Gastrointestinal:  [x] Diarrhea   [] Vomiting  [] Gastroesophageal reflux/heartburn   [] Difficulty swallowing. Genitourinary:  [] Chronic kidney disease   [] Difficult urination  [] Frequent urination   []   Blood in urine Skin:  [] Rashes   [] Ulcers  Psychological:  [] History of anxiety   []  History of major depression.  Physical Examination  Vitals:   10/04/16 1318  BP: (!) 142/77  Pulse: 91  Resp: 16  Weight: 159 lb (72.1 kg)   Body mass index is 28.17 kg/m. Gen: WD/WN, NAD Head: [place]/AT, No temporalis wasting.  Ear/Nose/Throat: Hearing grossly intact, nares w/o erythema or drainage Eyes: PER, EOMI, sclera nonicteric.  Neck: Supple, no large masses.   Pulmonary:  Good air movement, no audible wheezing bilaterally, no use of accessory muscles.  Cardiac: RRR, no JVD Vascular:  Vessel Right Left  Radial Palpable Palpable  Ulnar Palpable Palpable  Brachial Palpable Palpable  Carotid Palpable Palpable  Gastrointestinal: Non-distended. No guarding/no peritoneal signs.  Musculoskeletal: M/S 5/5 throughout.  No deformity or atrophy.  Neurologic: CN 2-12 intact. Symmetrical.  Speech is fluent. Motor exam as listed above. Psychiatric: Judgment intact, Mood & affect appropriate for pt's clinical situation. Dermatologic: No rashes or ulcers noted.  No changes consistent with cellulitis. Lymph : No lichenification or skin changes of chronic lymphedema.  CBC Lab Results  Component Value Date   WBC 11.3 (H) 06/01/2016   HGB 10.2 (L) 06/01/2016   HCT 31.9 (L) 06/01/2016   MCV 70.3 (L) 06/01/2016   PLT 276 06/01/2016    BMET    Component Value Date/Time   NA 138 08/31/2016 1512   NA 139 03/27/2016 0811   K  4.2 08/31/2016 1512   CL 107 08/31/2016 1512   CO2 27 08/31/2016 1512   GLUCOSE 89 08/31/2016 1512   BUN 12 08/31/2016 1512   BUN 17 03/27/2016 0811   CREATININE 1.03 08/31/2016 1512   CREATININE 0.95 09/01/2014 1659   CREATININE 0.80 08/15/2012 1609   CALCIUM 9.3 08/31/2016 1512   CALCIUM 9.7 09/01/2014 1659   GFRNONAA 59 (L) 03/27/2016 0811   GFRNONAA >60 09/01/2014 1659   GFRAA 68 03/27/2016 0811   GFRAA >60 09/01/2014 1659   CrCl cannot be calculated (Patient's most recent lab result is older than the maximum 21 days allowed.).  COAG Lab Results  Component Value Date   INR 1.0 09/01/2014    Radiology No results found.   Assessment/Plan 1. Splenic artery aneurysm (HCC) Splenic artery is small and does not need to be repaired. She should follow up in approximately one year as already arranged.  2. 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 I believe she needs a more complete workup to determine the extent of her diverticular disease whether she is lactose intolerant and the true nature of her intestinal dysfunction. Perhaps she has gluten intolerance her history certainly seems to support this. She has never had a colonoscopy or an upper GI per her statement 1 I asked her.  3. Mixed hyperlipidemia Continue statin as ordered and reviewed, no changes at this time   4. Arthralgia, unspecified joint Continue antihypertensive medications as already ordered, these medications have been reviewed and there are no changes at this time.   5. 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 I believe she needs a more complete workup to determine the extent of her diverticular disease whether she is lactose intolerant and the true nature of her intestinal dysfunction. Perhaps she has gluten intolerance her history certainly seems  to support this. She has never had a colonoscopy or  an upper GI per her statement 1 I asked her.  - Ambulatory referral to Gastroenterology    Levora Dredge, MD  10/05/2016 3:53 PM

## 2016-10-15 ENCOUNTER — Other Ambulatory Visit: Payer: Self-pay | Admitting: Internal Medicine

## 2016-11-08 ENCOUNTER — Ambulatory Visit (INDEPENDENT_AMBULATORY_CARE_PROVIDER_SITE_OTHER): Payer: 59 | Admitting: Gastroenterology

## 2016-11-08 ENCOUNTER — Encounter: Payer: Self-pay | Admitting: Gastroenterology

## 2016-11-08 ENCOUNTER — Other Ambulatory Visit: Payer: Self-pay

## 2016-11-08 ENCOUNTER — Ambulatory Visit: Payer: 59 | Admitting: Gastroenterology

## 2016-11-08 VITALS — BP 152/75 | HR 105 | Temp 98.2°F | Ht 63.0 in | Wt 159.0 lb

## 2016-11-08 DIAGNOSIS — R140 Abdominal distension (gaseous): Secondary | ICD-10-CM | POA: Diagnosis not present

## 2016-11-08 DIAGNOSIS — K582 Mixed irritable bowel syndrome: Secondary | ICD-10-CM | POA: Diagnosis not present

## 2016-11-08 DIAGNOSIS — K219 Gastro-esophageal reflux disease without esophagitis: Secondary | ICD-10-CM

## 2016-11-08 MED ORDER — DICYCLOMINE HCL 20 MG PO TABS: 20.0000 mg | ORAL_TABLET | Freq: Three times a day (TID) | ORAL | 3 refills | Status: DC

## 2016-11-08 NOTE — Progress Notes (Signed)
Gastroenterology Consultation  Referring Provider:     Sherlene Shams, MD Primary Care Physician:  Sherlene Shams, MD Primary Gastroenterologist:  Dr. Servando Snare     Reason for Consultation:     Abdominal pain        HPI:   Cheryl [name] is a 61 y.o. y/o female referred for consultation & management of Abdominal pain by Dr. Darrick Huntsman, Mar Daring, MD.  This patient comes in today with a history of abdominal pain. The patient was seen by vascular surgery for possible decrease intestinal blood flow as the cause for her symptoms. The vascular surgeon did not think that her symptoms were consistent with decreased blood flow. The patient was thought to be better served by seeing gastroenterology. The pain is reported to be in the epigastric area and intermittent. It is not always associated with eating or occurs after eating. She does have the symptoms associated with some watery diarrhea. The patient also reports that she has a history of constipation. The patient has had a diagnosis of diverticulosis in the past. The patient also reports that she has bloating and and nausea.  There is no report of any unexplained weight loss, fevers chills black stools or bloody stools.  The patient also denies any family history of colon cancer or colon polyps.   Past Medical History:  Diagnosis Date  . 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 Arrhythmia    left bundle branch block  . Arthritis   . Blood in stool   . Chicken pox   . Generalized headaches   . History of blood transfusion   . LBBB (left bundle branch block)   . Thyroid disease   . UTI (lower urinary tract infection)     Past Surgical History:  Procedure Laterality Date  . ABDOMINAL HYSTERECTOMY  1997  . APPENDECTOMY  1981  . BREAST BIOPSY Bilateral 1976   neg  . BREAST SURGERY  1976  . CARDIAC  CATHETERIZATION  2004   [facility]  . CARDIAC CATHETERIZATION  2006   [facility]  . cystic fibrosis tumor removal  1983  . THUMB AMPUTATION  1992   traumatic  . TONSILLECTOMY AND ADENOIDECTOMY  1964    Prior to Admission medications   Medication Sig Start Date End Date Taking? Authorizing Provider  buPROPion [facility] SR) 100 MG 12 hr tablet Take 1 tablet by mouth two  times daily 07/12/15   Sherlene Shams, MD  Calcium Carbonate-Vit D-Min (CALCIUM 1200 PO) Take 1 tablet by mouth daily.    [provider]  celecoxib (CELEBREX) 200 MG capsule TAKE 1 CAPSULE (200 MG TOTAL) BY MOUTH 2 (TWO) TIMES DAILY. 10/15/16   Sherlene Shams, MD  Cholecalciferol (VITAMIN D PO) Take by mouth. Pt takes 5000iu daily    [provider]  clobetasol ointment (TEMOVATE) 0.05 % Apply 1 application topically 2 (two) times daily. Until resolved Patient taking differently: Apply 1 application topically daily. Until resolved 05/03/14   Sherlene Shams, MD  Cyanocobalamin (VITAMIN B 12 PO) Take by mouth.    [provider]  cyclobenzaprine (FLEXERIL) 5 MG tablet 1-2 TABLETS AT BEDTIME, CAN BE INCREASED TO TWICE A DAY AS NEEDED FOR MUSCLE SPASM 01/16/16   [provider]  Diclofenac Sodium 1.5 % SOLN Apply to painful joint twice daily 10/20/14   Sherlene Shams, MD  dicyclomine (BENTYL) 20 MG tablet Take 1 tablet (20 mg total) by mouth 3 (three) times daily before meals. 08/31/16   Sherlene Shams, MD  Docusate Calcium (STOOL SOFTENER PO) Take by mouth.    [provider]  EPINEPHrine (EPIPEN) 0.3 mg/0.3 mL DEVI Inject 0.3 mLs (0.3 mg total) into the muscle once. Patient taking differently: Inject 0.3 mg into the muscle as needed.  08/15/12   Sherlene Shams, MD  HYDROcodone-acetaminophen (NORCO/VICODIN) 5-325 MG tablet Take 1 tablet by mouth at bedtime as needed. For pain 08/31/16   Sherlene Shams, MD  hydrOXYzine (ATARAX/VISTARIL) 25 MG tablet TAKE 1 TABLET (25 MG TOTAL) BY MOUTH 3 (THREE)  TIMES DAILY AS NEEDED FOR ITCHING. 12/14/13   Sherlene Shams, MD  Lactulose 20 GM/30ML SOLN 30 ml every 4 hours until constipation is relieved 07/27/15   Sherlene Shams, MD  nitroGLYCERIN (NITROSTAT) 0.4 MG SL tablet Place 1 tablet (0.4 mg total) under the tongue every 5 (five) minutes as needed for chest pain. 03/28/15   Antonieta Iba, MD  Omega-3 Fatty Acids (FISH OIL) 1000 MG CAPS Take 1 capsule by mouth daily.    [provider]  omeprazole (PRILOSEC) 40 MG capsule Take 1 capsule (40 mg total) by mouth daily. 08/31/16   Sherlene Shams, MD  progesterone (PROMETRIUM) 200 MG capsule Take 200 mg by mouth daily.    [provider]  propranolol (INDERAL) 10 MG tablet Take 1 tablet (10 mg total) by mouth 3 (three) times daily. 06/08/15   Antonieta Iba, MD  traMADol (ULTRAM) 50 MG tablet Take 1 tablet (50 mg total) by mouth 2 (two) times daily. For chronic pain 06/01/16   Sherlene Shams, MD  vitamin C (ASCORBIC ACID) 500 MG tablet Take 500 mg by mouth daily.    [provider]    Family History  Problem Relation Age of Onset  . Heart disease Mother   . Arthritis Mother   . Cancer Mother        breast  . Hyperlipidemia Mother   . Hypertension Mother   . Heart attack Mother   . Breast cancer Mother 58  . Heart disease Father   . Cancer Father        hodgkins disease, prostate  . Diabetes Father   . Arthritis Father   . Hypertension Father   . Heart disease Sister   . Diabetes Sister   . Heart disease Brother 78       ami x 8,  4 vessel CABG   . Diabetes Brother   . Liver disease Brother   . Lung disease Brother   . Heart disease Maternal Grandmother   . Diabetes Maternal Grandmother   . Heart disease Maternal Grandfather   . Heart disease Paternal Grandmother   . Diabetes Paternal Grandmother   . Stroke Paternal Grandfather   . Heart disease Paternal Grandfather   . Diabetes Paternal Grandfather   . Diabetes Maternal Aunt      Social History    Substance Use Topics  . Smoking status: Former Smoker    Packs/day: 0.25    Years: 34.00    Types: Cigarettes    Quit date: 04/10/2013  . Smokeless tobacco: Never Used  . Alcohol use Yes     Comment: occasional    Allergies as of 11/08/2016 - Review Complete 10/05/2016  Allergen Reaction Noted  . Peanuts [peanut oil]  09/17/2012  . Penicillins Other (See Comments) 08/15/2012  .  Sulfa antibiotics Other (See Comments) 08/15/2012  . Influenza vac split [flu virus vaccine]  07/27/2014  . Mobic [meloxicam] Nausea Only 07/27/2014  . Nickel  11/21/2012    Review of Systems:    All systems reviewed and negative except where noted in HPI.   Physical Exam:  There were no vitals taken for this visit. No LMP recorded. Patient has had a hysterectomy. Psych:  Alert and cooperative. Normal mood and affect. General:   Alert,  Well-developed, well-nourished, pleasant and cooperative in NAD Head:  Normocephalic and atraumatic. Eyes:  Sclera clear, no icterus.   Conjunctiva pink. Ears:  Normal auditory acuity. Nose:  No deformity, discharge, or lesions. Mouth:  No deformity or lesions,oropharynx pink & moist. Neck:  Supple; no masses or thyromegaly. Lungs:  Respirations even and unlabored.  Clear throughout to auscultation.   No wheezes, crackles, or rhonchi. No acute distress. Heart:  Regular rate and rhythm; no murmurs, clicks, rubs, or gallops. Abdomen:  Normal bowel sounds.  No bruits.  Soft, non-tender and non-distended without masses, hepatosplenomegaly or hernias noted.  No guarding or rebound tenderness.  Negative Carnett sign.   Rectal:  Deferred.  Msk:  Symmetrical without gross deformities.  Good, equal movement & strength bilaterally. Pulses:  Normal pulses noted. Extremities:  No clubbing or edema.  No cyanosis. Neurologic:  Alert and oriented x3;  grossly normal neurologically. Skin:  Intact without significant lesions or rashes.  No jaundice. Lymph Nodes:  No significant  cervical adenopathy. Psych:  Alert and cooperative. Normal mood and affect.  Imaging Studies: No results found.  Assessment and Plan:   Cathren LaineKathryn Ann Kratzke is a 61 y.o. y/o female comes in today with a long-standing history of alternating diarrhea and constipation.  The patient also has had chronic nausea.  There is no report of any worrisome symptoms such as weight loss black stools or bloody stools.  The patient has in fact gained weight.  The patient had a colonoscopy 4 years ago by a surgeon and it was reported to be normal.  The patient has symptoms compatible with irritable bowel syndrome with alternating diarrhea constipation bloating gas and dyspepsia. The patient's symptoms are not consistent with abdominal angina. The patient will be started on a trial of Dexilant.  The patient will also be started on a high fiber diet with a trial of dicyclomine 3 times a day.  The patient has been told that if the symptoms do not improve she should contact my office.  Midge Miniumarren Augustine Leverette, MD. Clementeen GrahamFACG   Note: This dictation was prepared with Dragon dictation along with smaller phrase technology. Any transcriptional errors that result from this process are unintentional.

## 2016-11-12 ENCOUNTER — Other Ambulatory Visit: Payer: Self-pay | Admitting: Internal Medicine

## 2016-11-13 ENCOUNTER — Ambulatory Visit (INDEPENDENT_AMBULATORY_CARE_PROVIDER_SITE_OTHER): Payer: 59 | Admitting: Vascular Surgery

## 2016-11-29 ENCOUNTER — Other Ambulatory Visit: Payer: Self-pay | Admitting: Internal Medicine

## 2016-11-29 NOTE — Telephone Encounter (Signed)
Refilled: 06/01/2016 Last OV: 08/31/2016 Next OV: 12/14/2016

## 2016-11-30 ENCOUNTER — Other Ambulatory Visit: Payer: Self-pay | Admitting: Internal Medicine

## 2016-11-30 NOTE — Telephone Encounter (Signed)
Printed, signed and faxed.  

## 2016-11-30 NOTE — Telephone Encounter (Signed)
90 day supply authorized and PRINTED

## 2016-12-05 ENCOUNTER — Inpatient Hospital Stay: Payer: 59 | Attending: Internal Medicine

## 2016-12-05 DIAGNOSIS — D509 Iron deficiency anemia, unspecified: Secondary | ICD-10-CM | POA: Diagnosis not present

## 2016-12-05 DIAGNOSIS — D563 Thalassemia minor: Secondary | ICD-10-CM | POA: Diagnosis not present

## 2016-12-05 DIAGNOSIS — R0602 Shortness of breath: Secondary | ICD-10-CM | POA: Diagnosis not present

## 2016-12-05 DIAGNOSIS — E079 Disorder of thyroid, unspecified: Secondary | ICD-10-CM | POA: Insufficient documentation

## 2016-12-05 DIAGNOSIS — Z87442 Personal history of urinary calculi: Secondary | ICD-10-CM | POA: Insufficient documentation

## 2016-12-05 DIAGNOSIS — F79 Unspecified intellectual disabilities: Secondary | ICD-10-CM | POA: Insufficient documentation

## 2016-12-05 DIAGNOSIS — M129 Arthropathy, unspecified: Secondary | ICD-10-CM | POA: Insufficient documentation

## 2016-12-05 DIAGNOSIS — Z79899 Other long term (current) drug therapy: Secondary | ICD-10-CM | POA: Insufficient documentation

## 2016-12-05 DIAGNOSIS — Z807 Family history of other malignant neoplasms of lymphoid, hematopoietic and related tissues: Secondary | ICD-10-CM | POA: Diagnosis not present

## 2016-12-05 DIAGNOSIS — R918 Other nonspecific abnormal finding of lung field: Secondary | ICD-10-CM | POA: Insufficient documentation

## 2016-12-05 DIAGNOSIS — I499 Cardiac arrhythmia, unspecified: Secondary | ICD-10-CM | POA: Diagnosis not present

## 2016-12-05 DIAGNOSIS — Z87891 Personal history of nicotine dependence: Secondary | ICD-10-CM | POA: Diagnosis not present

## 2016-12-05 DIAGNOSIS — I447 Left bundle-branch block, unspecified: Secondary | ICD-10-CM | POA: Diagnosis not present

## 2016-12-05 LAB — CBC WITH DIFFERENTIAL/PLATELET
Basophils Absolute: 0.1 10*3/uL (ref 0–0.1)
Basophils Relative: 1 %
Eosinophils Absolute: 0.2 10*3/uL (ref 0–0.7)
Eosinophils Relative: 2 %
HCT: 31.2 % — ABNORMAL LOW (ref 35.0–47.0)
Hemoglobin: 10.4 g/dL — ABNORMAL LOW (ref 12.0–16.0)
Lymphocytes Relative: 30 %
Lymphs Abs: 3 10*3/uL (ref 1.0–3.6)
MCH: 22.6 pg — ABNORMAL LOW (ref 26.0–34.0)
MCHC: 33.5 g/dL (ref 32.0–36.0)
MCV: 67.5 fL — ABNORMAL LOW (ref 80.0–100.0)
Monocytes Absolute: 0.7 10*3/uL (ref 0.2–0.9)
Monocytes Relative: 7 %
Neutro Abs: 6.2 10*3/uL (ref 1.4–6.5)
Neutrophils Relative %: 60 %
Platelets: 283 10*3/uL (ref 150–440)
RBC: 4.61 MIL/uL (ref 3.80–5.20)
RDW: 16.3 % — ABNORMAL HIGH (ref 11.5–14.5)
WBC: 10.1 10*3/uL (ref 3.6–11.0)

## 2016-12-05 LAB — IRON AND TIBC
Iron: 54 ug/dL (ref 28–170)
Saturation Ratios: 19 % (ref 10.4–31.8)
TIBC: 291 ug/dL (ref 250–450)
UIBC: 237 ug/dL

## 2016-12-05 LAB — FERRITIN: Ferritin: 217 ng/mL (ref 11–307)

## 2016-12-07 ENCOUNTER — Inpatient Hospital Stay (HOSPITAL_BASED_OUTPATIENT_CLINIC_OR_DEPARTMENT_OTHER): Payer: 59 | Admitting: Internal Medicine

## 2016-12-07 ENCOUNTER — Inpatient Hospital Stay: Payer: 59

## 2016-12-07 VITALS — BP 128/84 | HR 103 | Temp 97.3°F | Resp 18 | Wt 157.8 lb

## 2016-12-07 DIAGNOSIS — E079 Disorder of thyroid, unspecified: Secondary | ICD-10-CM | POA: Diagnosis not present

## 2016-12-07 DIAGNOSIS — F79 Unspecified intellectual disabilities: Secondary | ICD-10-CM | POA: Diagnosis not present

## 2016-12-07 DIAGNOSIS — R0602 Shortness of breath: Secondary | ICD-10-CM | POA: Diagnosis not present

## 2016-12-07 DIAGNOSIS — Z87442 Personal history of urinary calculi: Secondary | ICD-10-CM

## 2016-12-07 DIAGNOSIS — M129 Arthropathy, unspecified: Secondary | ICD-10-CM

## 2016-12-07 DIAGNOSIS — D509 Iron deficiency anemia, unspecified: Secondary | ICD-10-CM

## 2016-12-07 DIAGNOSIS — Z79899 Other long term (current) drug therapy: Secondary | ICD-10-CM

## 2016-12-07 DIAGNOSIS — I499 Cardiac arrhythmia, unspecified: Secondary | ICD-10-CM

## 2016-12-07 DIAGNOSIS — I447 Left bundle-branch block, unspecified: Secondary | ICD-10-CM

## 2016-12-07 DIAGNOSIS — D563 Thalassemia minor: Secondary | ICD-10-CM

## 2016-12-07 DIAGNOSIS — R918 Other nonspecific abnormal finding of lung field: Secondary | ICD-10-CM

## 2016-12-07 DIAGNOSIS — Z87891 Personal history of nicotine dependence: Secondary | ICD-10-CM

## 2016-12-07 DIAGNOSIS — Z807 Family history of other malignant neoplasms of lymphoid, hematopoietic and related tissues: Secondary | ICD-10-CM

## 2016-12-07 NOTE — Progress Notes (Signed)
Patient is here for follow up  

## 2016-12-07 NOTE — Assessment & Plan Note (Signed)
#   Chronic microcytic anemia/history of beta thalassemia - out of proportion to her anemia. S/p IV venoferx4 in fall of 2017. Improved symptoms. Hb today 10;  Iron sat- 20%. Recommend continued dietary intervention.  # SOB- exertional dyspnea ?; if not improved in few weeks; then talk to PCP/cardiology [hx of bundle branch block]. Not likely from her chronic anemia.  # Lung nodules- incidental on scans in march 2017. She was recommended CT in Feb 2018. Not had  follow-up. Recommend talking to her PCP regarding repeating the CT of the chest.   # recheck labs in 6 months- 1 week prior; venofer IV possible.   Cc; Dr.Tullo.

## 2016-12-07 NOTE — Progress Notes (Signed)
[facility] CONSULT NOTE  Patient Care Team: Sherlene Shamsullo, Teresa L, MD as PCP - General (Internal Medicine) Lemar LivingsByrnett, Merrily PewJeffrey W, MD as Consulting Physician (General Surgery)  CHIEF COMPLAINTS/PURPOSE OF CONSULTATION:    HEMATOLOGY HISTORY  # CHRONIC MICROCYTIC ANEMIA- BETA THALASSEMIA MINOR [colo- 2016; Dr. Fanny SkatesByrnet] July 2017- IV trial of Venofer x4   # Lung nodules [CT- sub cm Jan 2017]- followed by PCP  HISTORY OF PRESENTING ILLNESS:  Cheryl [name] 61 y.o.  female with a history of beta thalassemia minor; also iron deficiency anemia- status post IV iron in July 2017 is here for follow-up  Patient continues to eat iron rich foods. Her energy is adequate. Denies any Difficult to swallowing or constipation or diarrhea. Denies any night sweats. No nausea no vomiting no blood in stools. Complains of shortness of breath especially with exertion. States that "hot weather" makes her breathing worse. She denies any chest pain.   ROS: A complete 10 point review of system is done which is negative except mentioned above in history of present illness  MEDICAL HISTORY:  Past Medical History:  Diagnosis Date  . 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 Arrhythmia    left bundle branch block  . Arthritis   . Blood in stool   . Chicken pox   . Generalized headaches   . History of blood transfusion   . LBBB (left bundle branch block)   . Thyroid disease   . UTI (lower urinary tract infection)     SURGICAL HISTORY: Past Surgical History:  Procedure Laterality Date  . ABDOMINAL HYSTERECTOMY  1997  . APPENDECTOMY  1981  . BREAST BIOPSY Bilateral 1976   neg  . BREAST SURGERY  1976  . CARDIAC CATHETERIZATION  2004   [facility]  . CARDIAC CATHETERIZATION  2006   [facility]  . cystic fibrosis tumor removal  1983  . THUMB AMPUTATION  1992   traumatic  .  TONSILLECTOMY AND ADENOIDECTOMY  1964    SOCIAL HISTORY: Social History   Social History  . Marital status: Married    Spouse name: N/A  . Number of children: N/A  . Years of education: N/A   Occupational History  . Not on file.   Social History Main Topics  . Smoking status: Former Smoker    Packs/day: 0.25    Years: 34.00    Types: Cigarettes    Quit date: 04/10/2013  . Smokeless tobacco: Never Used  . Alcohol use Yes     Comment: occasional  . Drug use: No  . Sexual activity: Not on file   Other Topics Concern  . Not on file   Social History Narrative   Married to [employer] Mehl   Works for [employer], Engineer, structuraladministrative secretary    FAMILY HISTORY: Family History  Problem Relation Age of Onset  . Heart disease Mother   . Arthritis Mother   . Cancer Mother        breast  . Hyperlipidemia Mother   . Hypertension Mother   . Heart attack Mother   . Breast cancer Mother 1744  . Heart disease Father   . Cancer Father        hodgkins disease, prostate  . Diabetes Father   . Arthritis Father   . Hypertension Father   . Heart disease Sister   . Diabetes Sister   . Heart disease  Brother 59       ami x 8,  4 vessel CABG   . Diabetes Brother   . Liver disease Brother   . Lung disease Brother   . Heart disease Maternal Grandmother   . Diabetes Maternal Grandmother   . Heart disease Maternal Grandfather   . Heart disease Paternal Grandmother   . Diabetes Paternal Grandmother   . Stroke Paternal Grandfather   . Heart disease Paternal Grandfather   . Diabetes Paternal Grandfather   . Diabetes Maternal Aunt     ALLERGIES:  is allergic to peanuts [peanut oil]; penicillins; sulfa antibiotics; influenza vac split [flu virus vaccine]; mobic [meloxicam]; and nickel.  MEDICATIONS:  Current Outpatient Prescriptions  Medication Sig Dispense Refill  . buPROPion (WELLBUTRIN SR) 100 MG 12 hr tablet TAKE 1 TABLET BY MOUTH TWO  TIMES DAILY 180 tablet 1  . Calcium Carbonate-Vit  D-Min (CALCIUM 1200 PO) Take 1 tablet by mouth daily.    . celecoxib (CELEBREX) 200 MG capsule TAKE 1 CAPSULE (200 MG TOTAL) BY MOUTH 2 (TWO) TIMES DAILY. 180 capsule 0  . Cholecalciferol (VITAMIN D PO) Take by mouth. Pt takes 5000iu daily    . Cyanocobalamin (VITAMIN B 12 PO) Take by mouth.    . cyclobenzaprine (FLEXERIL) 5 MG tablet 1-2 TABLETS AT BEDTIME, CAN BE INCREASED TO TWICE A DAY AS NEEDED FOR MUSCLE SPASM  2  . dicyclomine (BENTYL) 20 MG tablet Take 1 tablet (20 mg total) by mouth 3 (three) times daily before meals. 90 tablet 3  . Docusate Calcium (STOOL SOFTENER PO) Take by mouth.    . EPINEPHrine (EPIPEN) 0.3 mg/0.3 mL DEVI Inject 0.3 mLs (0.3 mg total) into the muscle once. (Patient taking differently: Inject 0.3 mg into the muscle as needed. ) 1 Device 5  . HYDROcodone-acetaminophen (NORCO/VICODIN) 5-325 MG tablet Take 1 tablet by mouth at bedtime as needed. For pain 30 tablet 0  . omeprazole (PRILOSEC) 40 MG capsule Take 1 capsule (40 mg total) by mouth daily. 30 capsule 3  . progesterone (PROMETRIUM) 200 MG capsule Take 200 mg by mouth daily.    . propranolol (INDERAL) 10 MG tablet Take 1 tablet (10 mg total) by mouth 3 (three) times daily. 21 tablet 6  . traMADol (ULTRAM) 50 MG tablet TAKE ONE TABLET BY MOUTH TWO TIMES DAILY FOR CHRONIC PAIN 60 tablet 2  . vitamin C (ASCORBIC ACID) 500 MG tablet Take 500 mg by mouth daily.    . clobetasol ointment (TEMOVATE) 0.05 % Apply 1 application topically 2 (two) times daily. Until resolved (Patient not taking: Reported on 12/07/2016) 30 g 2  . Diclofenac Sodium 1.5 % SOLN Apply to painful joint twice daily (Patient not taking: Reported on 11/08/2016) 150 mL 3  . hydrOXYzine (ATARAX/VISTARIL) 25 MG tablet TAKE 1 TABLET (25 MG TOTAL) BY MOUTH 3 (THREE) TIMES DAILY AS NEEDED FOR ITCHING. (Patient not taking: Reported on 12/07/2016) 90 tablet 3  . Lactulose 20 GM/30ML SOLN 30 ml every 4 hours until constipation is relieved (Patient not taking:  Reported on 11/08/2016) 236 mL 3  . nitroGLYCERIN (NITROSTAT) 0.4 MG SL tablet Place 1 tablet (0.4 mg total) under the tongue every 5 (five) minutes as needed for chest pain. (Patient not taking: Reported on 12/07/2016) 25 tablet 3  . Omega-3 Fatty Acids (FISH OIL) 1000 MG CAPS Take 1 capsule by mouth daily.     No current facility-administered medications for this visit.       [name]  PHYSICAL EXAMINATION:  Vitals:   12/07/16 1359  BP: 128/84  Pulse: (!) 103  Resp: 18  Temp: (!) 97.3 F (36.3 C)   Filed Weights   12/07/16 1359  Weight: 157 lb 12.8 oz (71.6 kg)    GENERAL: Well-nourished well-developed; Alert, no distress and comfortable.  Alone.  EYES: no pallor or icterus OROPHARYNX: no thrush or ulceration; good dentition  NECK: supple, no masses felt LYMPH:  no palpable lymphadenopathy in the cervical, axillary or inguinal regions LUNGS: clear to auscultation and  No wheeze or crackles HEART/CVS: regular rate & rhythm and no murmurs; No lower extremity edema ABDOMEN: abdomen soft, non-tender and normal bowel sounds Musculoskeletal:no cyanosis of digits and no clubbing  PSYCH: alert & oriented x 3 with fluent speech NEURO: no focal motor/sensory deficits SKIN:  no rashes or significant lesions  LABORATORY DATA:  I have reviewed the data as listed Lab Results  Component Value Date   WBC 10.1 12/05/2016   HGB 10.4 (L) 12/05/2016   HCT 31.2 (L) 12/05/2016   MCV 67.5 (L) 12/05/2016   PLT 283 12/05/2016    Recent Labs  03/27/16 0811 08/31/16 1512  NA 139 138  K 4.5 4.2  CL 103 107  CO2 21 27  GLUCOSE 101* 89  BUN 17 12  CREATININE 1.03* 1.03  CALCIUM 8.8 9.3  GFRNONAA 59*  --   GFRAA 68  --   PROT 6.7 6.7  ALBUMIN 4.5 4.4  AST 21 18  ALT 19 18  ALKPHOS 83 68  BILITOT 0.8 0.8     No results found.  ASSESSMENT & PLAN:   Microcytic anemia # Chronic microcytic anemia/history of beta thalassemia - out of proportion to her anemia. S/p IV venoferx4 in  fall of 2017. Improved symptoms. Hb today 10;  Iron sat- 20%. Recommend continued dietary intervention.  # SOB- exertional dyspnea ?; if not improved in few weeks; then talk to PCP/cardiology [hx of bundle branch block]. Not likely from her chronic anemia.  # Lung nodules- incidental on scans in march 2017. She was recommended CT in Feb 2018. Not had  follow-up. Recommend talking to her PCP regarding repeating the CT of the chest.   # recheck labs in 6 months- 1 week prior; venofer IV possible.   Cc; Dr.Tullo.    Earna Coder, MD 12/07/2016 2:22 PM

## 2016-12-14 ENCOUNTER — Encounter: Payer: Self-pay | Admitting: Internal Medicine

## 2016-12-14 ENCOUNTER — Ambulatory Visit (INDEPENDENT_AMBULATORY_CARE_PROVIDER_SITE_OTHER): Payer: 59 | Admitting: Internal Medicine

## 2016-12-14 VITALS — BP 150/84 | HR 88 | Temp 98.1°F | Resp 15 | Ht 63.0 in | Wt 157.8 lb

## 2016-12-14 DIAGNOSIS — M545 Low back pain, unspecified: Secondary | ICD-10-CM

## 2016-12-14 DIAGNOSIS — R768 Other specified abnormal immunological findings in serum: Secondary | ICD-10-CM | POA: Diagnosis not present

## 2016-12-14 DIAGNOSIS — M19211 Secondary osteoarthritis, right shoulder: Secondary | ICD-10-CM | POA: Diagnosis not present

## 2016-12-14 DIAGNOSIS — M19212 Secondary osteoarthritis, left shoulder: Secondary | ICD-10-CM | POA: Diagnosis not present

## 2016-12-14 DIAGNOSIS — E785 Hyperlipidemia, unspecified: Secondary | ICD-10-CM | POA: Diagnosis not present

## 2016-12-14 DIAGNOSIS — I10 Essential (primary) hypertension: Secondary | ICD-10-CM | POA: Diagnosis not present

## 2016-12-14 DIAGNOSIS — R7989 Other specified abnormal findings of blood chemistry: Secondary | ICD-10-CM | POA: Diagnosis not present

## 2016-12-14 DIAGNOSIS — G8929 Other chronic pain: Secondary | ICD-10-CM | POA: Diagnosis not present

## 2016-12-14 MED ORDER — HYDROCODONE-ACETAMINOPHEN 5-325 MG PO TABS: 1.0000 | ORAL_TABLET | Freq: Every evening | ORAL | 0 refills | Status: DC | PRN

## 2016-12-14 NOTE — Patient Instructions (Addendum)
your blood pressure is still elevated,  ( above the currently recommended acceptable standards of 120/70).  It may be anxiety/pain but it might be the celebrex. ,Please have your daughter check it again in a few days and if still elevated,  Suspend the celebrex and recheck in 48 hours.     I RECOMMEND that we repeat the blood test  On your kidneys on a day when you are well hydrated.  I have ordered the BMET.

## 2016-12-14 NOTE — Progress Notes (Signed)
Subjective:  Patient ID: Cheryl [name], female    DOB: 1955-07-13  Age: 61 y.o. MRN: [id number]  CC: The primary encounter diagnosis was Elevated serum creatinine. Diagnoses of Other secondary osteoarthritis of both shoulders, Chronic low back pain without sciatica, unspecified back pain laterality, ANA positive, Dyslipidemia (high LDL; low HDL), and Elevated blood pressure reading with diagnosis of hypertension were also pertinent to this visit.  HPI Cheryl [name] presents for FOLLOW UP on chronic low back  pain managed with daily vicodin,tramadil  celebrex and flexeril.  Last seen April 6 .  Last refill on vicodin June 14  Refill history confirmed via [name] Controlled Substance databas, accessed by me today..  No longer getting iron infusions,  Eating more natural iron sources.   Outpatient Medications Prior to Visit  Medication Sig Dispense Refill  . buPROPion (WELLBUTRIN SR) 100 MG 12 hr tablet TAKE 1 TABLET BY MOUTH TWO  TIMES DAILY 180 tablet 1  . Calcium Carbonate-Vit D-Min (CALCIUM 1200 PO) Take 1 tablet by mouth daily.    . celecoxib (CELEBREX) 200 MG capsule TAKE 1 CAPSULE (200 MG TOTAL) BY MOUTH 2 (TWO) TIMES DAILY. 180 capsule 0  . Cholecalciferol (VITAMIN D PO) Take by mouth. Pt takes 5000iu daily    . clobetasol ointment (TEMOVATE) 1.85 % Apply 1 application topically 2 (two) times daily. Until resolved 30 g 2  . Cyanocobalamin (VITAMIN B 12 PO) Take by mouth.    . cyclobenzaprine (FLEXERIL) 5 MG tablet 1-2 TABLETS AT BEDTIME, CAN BE INCREASED TO TWICE A DAY AS NEEDED FOR MUSCLE SPASM  2  . Diclofenac Sodium 1.5 % SOLN Apply to painful joint twice daily 150 mL 3  . dicyclomine (BENTYL) 20 MG tablet Take 1 tablet (20 mg total) by mouth 3 (three) times daily before meals. 90 tablet 3  . Docusate Calcium (STOOL SOFTENER PO) Take by mouth.    . EPINEPHrine (EPIPEN) 0.3 mg/0.3 mL DEVI Inject 0.3 mLs (0.3 mg total) into the muscle once. (Patient taking differently: Inject  0.3 mg into the muscle as needed. ) 1 Device 5  . hydrOXYzine (ATARAX/VISTARIL) 25 MG tablet TAKE 1 TABLET (25 MG TOTAL) BY MOUTH 3 (THREE) TIMES DAILY AS NEEDED FOR ITCHING. 90 tablet 3  . Lactulose 20 GM/30ML SOLN 30 ml every 4 hours until constipation is relieved 236 mL 3  . nitroGLYCERIN (NITROSTAT) 0.4 MG SL tablet Place 1 tablet (0.4 mg total) under the tongue every 5 (five) minutes as needed for chest pain. 25 tablet 3  . Omega-3 Fatty Acids (FISH OIL) 1000 MG CAPS Take 1 capsule by mouth daily.    [name] omeprazole (PRILOSEC) 40 MG capsule Take 1 capsule (40 mg total) by mouth daily. 30 capsule 3  . progesterone (PROMETRIUM) 200 MG capsule Take 200 mg by mouth daily.    . propranolol (INDERAL) 10 MG tablet Take 1 tablet (10 mg total) by mouth 3 (three) times daily. 21 tablet 6  . traMADol (ULTRAM) 50 MG tablet TAKE ONE TABLET BY MOUTH TWO TIMES DAILY FOR CHRONIC PAIN 60 tablet 2  . vitamin C (ASCORBIC ACID) 500 MG tablet Take 500 mg by mouth daily.    [name] HYDROcodone-acetaminophen (NORCO/VICODIN) 5-325 MG tablet Take 1 tablet by mouth at bedtime as needed. For pain 30 tablet 0   No facility-administered medications prior to visit.     Review of Systems;  Patient denies headache, fevers, malaise, unintentional weight loss, skin rash, eye pain, sinus congestion and sinus pain,  sore throat, dysphagia,  hemoptysis , cough, dyspnea, wheezing, chest pain, palpitations, orthopnea, edema, abdominal pain, nausea, melena, diarrhea, constipation, flank pain, dysuria, hematuria, urinary  Frequency, nocturia, numbness, tingling, seizures,  Focal weakness, Loss of consciousness,  Tremor, insomnia, depression, anxiety, and suicidal ideation.      Objective:  BP (!) 150/84 (BP Location: Left Arm, Patient Position: Sitting, Cuff Size: Normal)   Pulse 88   Temp 98.1 F (36.7 C) (Oral)   Resp 15   Ht '5\' 3"'  (1.6 m)   Wt 157 lb 12.8 oz (71.6 kg)   SpO2 98%   BMI 27.95 kg/m   BP Readings from Last 3  Encounters:  12/14/16 (!) 150/84  12/07/16 128/84  11/08/16 (!) 152/75    Wt Readings from Last 3 Encounters:  12/14/16 157 lb 12.8 oz (71.6 kg)  12/07/16 157 lb 12.8 oz (71.6 kg)  11/08/16 159 lb (72.1 kg)    General appearance: alert, cooperative and appears stated age Ears: normal TM's and external ear canals both ears Throat: lips, mucosa, and tongue normal; teeth and gums normal Neck: no adenopathy, no carotid bruit, supple, symmetrical, trachea midline and thyroid not enlarged, symmetric, no tenderness/mass/nodules Back: symmetric, no curvature. ROM normal. No CVA tenderness. Lungs: clear to auscultation bilaterally Heart: regular rate and rhythm, S1, S2 normal, no murmur, click, rub or gallop Abdomen: soft, non-tender; bowel sounds normal; no masses,  no organomegaly Pulses: 2+ and symmetric Skin: Skin color, texture, turgor normal. No rashes or lesions Lymph nodes: Cervical, supraclavicular, and axillary nodes normal.  Lab Results  Component Value Date   HGBA1C 5.1 10/26/2015   HGBA1C 5.4 11/25/2013    Lab Results  Component Value Date   CREATININE 1.03 08/31/2016   CREATININE 1.03 (H) 03/27/2016   CREATININE 0.85 11/15/2015    Lab Results  Component Value Date   WBC 10.1 12/05/2016   HGB 10.4 (L) 12/05/2016   HCT 31.2 (L) 12/05/2016   PLT 283 12/05/2016   GLUCOSE 89 08/31/2016   CHOL 152 10/26/2015   TRIG 109 10/26/2015   HDL 32 (L) 10/26/2015   LDLDIRECT 104 (H) 08/15/2012   LDLCALC 98 10/26/2015   ALT 18 08/31/2016   AST 18 08/31/2016   NA 138 08/31/2016   K 4.2 08/31/2016   CL 107 08/31/2016   CREATININE 1.03 08/31/2016   BUN 12 08/31/2016   CO2 27 08/31/2016   TSH 0.915 10/26/2015   INR 1.0 09/01/2014   HGBA1C 5.1 10/26/2015    Mr Lumbar Spine Wo Contrast  Result Date: 06/10/2016 CLINICAL DATA:  Low back pain and right leg pain EXAM: MRI LUMBAR SPINE WITHOUT CONTRAST TECHNIQUE: Multiplanar, multisequence MR imaging of the lumbar spine was  performed. No intravenous contrast was administered. COMPARISON:  None. FINDINGS: Segmentation:  Normal Alignment:  Normal Vertebrae: There is diffusely heterogeneous bone marrow signal throughout the lumbar spine and sacrum. No acute progression fracture, evidence of discitis osteomyelitis or facet edema. Conus medullaris: Extends to the upper L2 level and appears normal. Paraspinal and other soft tissues: The visualized aorta, IVC and iliac vessels are normal. The visualized retroperitoneal organs and paraspinal soft tissues are normal. Disc levels: T12-L1: Normal disc space facets. No spinal canal or neural foraminal stenosis. L1-L2: Normal disc space and facets. No spinal canal or neuroforaminal stenosis. L2-L3: Normal disc space and facets. No spinal canal or neuroforaminal stenosis. L3-L4: Normal disc space and facets. No spinal canal or neuroforaminal stenosis. L4-L5: Disc desiccation with left eccentric bulge. No spinal canal  stenosis. Mild left foraminal narrowing.: L5-S1: Small central disc protrusion. No spinal canal or neural foraminal stenosis. IMPRESSION: 1. Diffusely heterogeneous bone marrow signal intensity. This may be seen in the setting of chronic anemia or in smokers; however, the appearance is also compatible with marrow replacement disorders, including multiple myeloma. Recommend correlation with laboratory studies. 2. Mild lower lumbar degenerative disc disease with mild left foraminal narrowing at L4-L5. 3. No findings to explain the reported right lower extremity symptoms. Electronically Signed   By: Ulyses Jarred M.D.   On: 06/10/2016 01:12    Assessment & Plan:   Problem List Items Addressed This Visit    ANA positive    Low titer  No signs of autoimmune disease per rheumatology [facility]) .       Chronic lower back pain    RI of lumbar spine SHOWED NO SPINAL STENOSIS.  Previously managed with bid vicodin , which I have now reduced to once daily at bedtime only. continue  celebrex and tramadol for  daytime use.   Refill history confirmed via [place] Controlled Substance databas, accessed by me today.   Refills for July, August and September given      Relevant Medications   HYDROcodone-acetaminophen (NORCO/VICODIN) 5-325 MG tablet   HYDROcodone-acetaminophen (NORCO/VICODIN) 5-325 MG tablet   Dyslipidemia (high LDL; low HDL)    With low HDL by May 2017  fasting lipids.  Mediterranean diet and regular exercise recommended.   Lab Results  Component Value Date   CHOL 152 10/26/2015   HDL 32 (L) 10/26/2015   LDLCALC 98 10/26/2015   LDLDIRECT 104 (H) 08/15/2012   TRIG 109 10/26/2015         Elevated blood pressure reading with diagnosis of hypertension    She has no history of hypertension but has had several elevated readings.  She has been asked to check her pressures at home and submit readings for evaluation.       Other Visit Diagnoses    Elevated serum creatinine    -  Primary   Relevant Orders   Basic metabolic panel   Other secondary osteoarthritis of both shoulders       Relevant Medications   HYDROcodone-acetaminophen (NORCO/VICODIN) 5-325 MG tablet   HYDROcodone-acetaminophen (NORCO/VICODIN) 5-325 MG tablet    A total of 25 minutes of face to face time was spent with patient more than half of which was spent in counselling about the above mentioned conditions  and coordination of care   I am having Ms. Colledge maintain her Cholecalciferol (VITAMIN D PO), Calcium Carbonate-Vit D-Min (CALCIUM 1200 PO), vitamin C, Fish Oil, progesterone, Docusate Calcium (STOOL SOFTENER PO), EPINEPHrine, Cyanocobalamin (VITAMIN B 12 PO), hydrOXYzine, clobetasol ointment, Diclofenac Sodium, nitroGLYCERIN, propranolol, Lactulose, cyclobenzaprine, omeprazole, celecoxib, dicyclomine, buPROPion, traMADol, HYDROcodone-acetaminophen, and HYDROcodone-acetaminophen.  Meds ordered this encounter  Medications  . DISCONTD: HYDROcodone-acetaminophen (NORCO/VICODIN) 5-325 MG  tablet    Sig: Take 1 tablet by mouth at bedtime as needed. For pain    Dispense:  30 tablet    Refill:  0  . HYDROcodone-acetaminophen (NORCO/VICODIN) 5-325 MG tablet    Sig: Take 1 tablet by mouth at bedtime as needed. For pain    Dispense:  30 tablet    Refill:  0    May refiill on or after  January 14 2017  . HYDROcodone-acetaminophen (NORCO/VICODIN) 5-325 MG tablet    Sig: Take 1 tablet by mouth at bedtime as needed. For pain    Dispense:  30 tablet  Refill:  0    MAY REFILL ON OR AFTER February 14 2017    Medications Discontinued During This Encounter  Medication Reason  . HYDROcodone-acetaminophen (NORCO/VICODIN) 5-325 MG tablet Reorder  . HYDROcodone-acetaminophen (NORCO/VICODIN) 5-325 MG tablet Reorder    Follow-up: Return in about 3 months (around 03/16/2017).   Crecencio Mc, MD

## 2016-12-16 DIAGNOSIS — I129 Hypertensive chronic kidney disease with stage 1 through stage 4 chronic kidney disease, or unspecified chronic kidney disease: Secondary | ICD-10-CM | POA: Insufficient documentation

## 2016-12-16 NOTE — Assessment & Plan Note (Signed)
RI of lumbar spine SHOWED NO SPINAL STENOSIS.  Previously managed with bid vicodin , which I have now reduced to once daily at bedtime only. continue celebrex and tramadol for  daytime use.   Refill history confirmed via  Controlled Substance databas, accessed by me today.   Refills for July, August and September given

## 2016-12-16 NOTE — Assessment & Plan Note (Signed)
She has no history of hypertension but has had several elevated readings.  She has been asked to check her pressures at home and submit readings for evaluation.  

## 2016-12-16 NOTE — Assessment & Plan Note (Signed)
With low HDL by May 2017  fasting lipids.  Mediterranean diet and regular exercise recommended.   Lab Results  Component Value Date   CHOL 152 10/26/2015   HDL 32 (L) 10/26/2015   LDLCALC 98 10/26/2015   LDLDIRECT 104 (H) 08/15/2012   TRIG 109 10/26/2015

## 2016-12-16 NOTE — Assessment & Plan Note (Signed)
Low titer  No signs of autoimmune disease per rheumatology [facility] .

## 2017-01-01 ENCOUNTER — Ambulatory Visit: Payer: 59 | Admitting: Gastroenterology

## 2017-01-07 ENCOUNTER — Ambulatory Visit (INDEPENDENT_AMBULATORY_CARE_PROVIDER_SITE_OTHER): Payer: 59 | Admitting: Vascular Surgery

## 2017-01-09 ENCOUNTER — Other Ambulatory Visit: Payer: Self-pay | Admitting: Internal Medicine

## 2017-01-11 ENCOUNTER — Other Ambulatory Visit: Payer: Self-pay | Admitting: Internal Medicine

## 2017-02-20 NOTE — Telephone Encounter (Signed)
Error

## 2017-02-21 NOTE — Telephone Encounter (Signed)
Error

## 2017-02-26 ENCOUNTER — Telehealth: Payer: Self-pay | Admitting: Internal Medicine

## 2017-02-26 ENCOUNTER — Telehealth: Payer: Self-pay | Admitting: *Deleted

## 2017-02-26 DIAGNOSIS — Z79899 Other long term (current) drug therapy: Secondary | ICD-10-CM

## 2017-02-26 NOTE — Telephone Encounter (Signed)
Do you just want a CMP ordered?

## 2017-02-26 NOTE — Telephone Encounter (Signed)
Pt called about not being able to see the lab order. It was put in July 2018. [facility] was having trouble with there system. Pt works for [facility] and cannot see it. Order needed please and thank you!  Call pt @ [phone]. Thank you!

## 2017-02-26 NOTE — Telephone Encounter (Signed)
ordered

## 2017-02-26 NOTE — Telephone Encounter (Signed)
Dr Darrick Huntsman

## 2017-02-26 NOTE — Telephone Encounter (Signed)
Pt stated that she was to have creatine level checked. Pt has requested to have to order placed for [facility]

## 2017-02-27 NOTE — Telephone Encounter (Signed)
Reordered the lab and told the pt that she could call [facility] again and see if they can see it. If not then she would need to come by here and pick up the order form. Pt gave a verbal understanding.

## 2017-03-09 ENCOUNTER — Other Ambulatory Visit: Payer: Self-pay | Admitting: Internal Medicine

## 2017-03-12 ENCOUNTER — Other Ambulatory Visit: Payer: Self-pay | Admitting: Internal Medicine

## 2017-03-12 NOTE — Telephone Encounter (Signed)
REFILLED

## 2017-03-12 NOTE — Telephone Encounter (Signed)
rx printed, signed and faxed

## 2017-03-12 NOTE — Telephone Encounter (Signed)
Has a three month office visit in 10 days, please advise for refill, thanks

## 2017-03-14 LAB — COMPREHENSIVE METABOLIC PANEL
ALT: 15 IU/L (ref 0–32)
AST: 18 IU/L (ref 0–40)
Albumin/Globulin Ratio: 2 (ref 1.2–2.2)
Albumin: 4.5 g/dL (ref 3.6–4.8)
Alkaline Phosphatase: 81 IU/L (ref 39–117)
BUN/Creatinine Ratio: 10 — ABNORMAL LOW (ref 12–28)
BUN: 11 mg/dL (ref 8–27)
Bilirubin Total: 0.5 mg/dL (ref 0.0–1.2)
CO2: 21 mmol/L (ref 20–29)
Calcium: 9.6 mg/dL (ref 8.7–10.3)
Chloride: 102 mmol/L (ref 96–106)
Creatinine, Ser: 1.11 mg/dL — ABNORMAL HIGH (ref 0.57–1.00)
GFR calc Af Amer: 62 mL/min/{1.73_m2} (ref >59)
GFR calc non Af Amer: 54 mL/min/{1.73_m2} — ABNORMAL LOW (ref >59)
Globulin, Total: 2.2 g/dL (ref 1.5–4.5)
Glucose: 104 mg/dL — ABNORMAL HIGH (ref 65–99)
Potassium: 4.4 mmol/L (ref 3.5–5.2)
Sodium: 138 mmol/L (ref 134–144)
Total Protein: 6.7 g/dL (ref 6.0–8.5)

## 2017-03-17 ENCOUNTER — Encounter: Payer: Self-pay | Admitting: Internal Medicine

## 2017-03-22 ENCOUNTER — Ambulatory Visit (INDEPENDENT_AMBULATORY_CARE_PROVIDER_SITE_OTHER): Payer: 59 | Admitting: Internal Medicine

## 2017-03-22 ENCOUNTER — Encounter: Payer: Self-pay | Admitting: Internal Medicine

## 2017-03-22 VITALS — BP 132/84 | HR 95 | Temp 98.2°F | Resp 14 | Ht 63.0 in | Wt 159.8 lb

## 2017-03-22 DIAGNOSIS — M19212 Secondary osteoarthritis, left shoulder: Secondary | ICD-10-CM

## 2017-03-22 DIAGNOSIS — G8929 Other chronic pain: Secondary | ICD-10-CM

## 2017-03-22 DIAGNOSIS — M19211 Secondary osteoarthritis, right shoulder: Secondary | ICD-10-CM | POA: Diagnosis not present

## 2017-03-22 DIAGNOSIS — M545 Low back pain, unspecified: Secondary | ICD-10-CM

## 2017-03-22 DIAGNOSIS — K582 Mixed irritable bowel syndrome: Secondary | ICD-10-CM

## 2017-03-22 DIAGNOSIS — R944 Abnormal results of kidney function studies: Secondary | ICD-10-CM | POA: Diagnosis not present

## 2017-03-22 DIAGNOSIS — K5730 Diverticulosis of large intestine without perforation or abscess without bleeding: Secondary | ICD-10-CM

## 2017-03-22 DIAGNOSIS — Z1239 Encounter for other screening for malignant neoplasm of breast: Secondary | ICD-10-CM

## 2017-03-22 DIAGNOSIS — Z1231 Encounter for screening mammogram for malignant neoplasm of breast: Secondary | ICD-10-CM | POA: Diagnosis not present

## 2017-03-22 MED ORDER — HYDROCODONE-ACETAMINOPHEN 5-325 MG PO TABS
1.0000 | ORAL_TABLET | Freq: Every evening | ORAL | 0 refills | Status: DC | PRN
Start: 2017-03-22 — End: 2017-06-28

## 2017-03-22 MED ORDER — HYDROCODONE-ACETAMINOPHEN 5-325 MG PO TABS: 1.0000 | ORAL_TABLET | Freq: Every evening | ORAL | 0 refills | Status: DC | PRN

## 2017-03-22 MED ORDER — TRAMADOL HCL 50 MG PO TABS: 50.0000 mg | ORAL_TABLET | Freq: Two times a day (BID) | ORAL | 5 refills | Status: DC | PRN

## 2017-03-22 NOTE — Progress Notes (Signed)
Subjective:  Patient ID: Cheryl [name], female    DOB: Apr 27, 1956  Age: 61 y.o. MRN: [id number]  CC: The primary encounter diagnosis was Breast cancer screening. Diagnoses of Other secondary osteoarthritis of both shoulders, Decreased GFR, Irritable bowel syndrome with both constipation and diarrhea, Chronic low back pain without sciatica, unspecified back pain laterality, and Diverticulosis of large intestine without hemorrhage were also pertinent to this visit.  HPI Cheryl [name] presents for follow up on multiple issues including chronic back pain and peripheral vascular disease.  She was referred to GI by vascular surgery in May for evaluation of recurrent abdominal pain not thought to be due to celiac stenosis found on prior CT scan.    IBS was diagnosed by Lucilla Lame .   Dexilant ,  Dicyclomine, and high fiber diet  were prescribed.       Her back pain is  secondary to degenerative changes and spurring without spinal stenosis. Her pain is aggravated by her work and  managed with vicodin  at night, celebrex and  Tramadol during the day .  Since her last visit her creatinine was noted to have increased and she has not had a repeat assessment of renal function done.    Discussed suspending the celebrex and repeating the cr.    Outpatient Medications Prior to Visit  Medication Sig Dispense Refill  . buPROPion (WELLBUTRIN SR) 100 MG 12 hr tablet TAKE 1 TABLET BY MOUTH TWO  TIMES DAILY 180 tablet 1  . Calcium Carbonate-Vit D-Min (CALCIUM 1200 PO) Take 1 tablet by mouth daily.    . celecoxib (CELEBREX) 200 MG capsule TAKE 1 CAPSULE (200 MG TOTAL) BY MOUTH 2 (TWO) TIMES DAILY. 180 capsule 0  . Cholecalciferol (VITAMIN D PO) Take by mouth. Pt takes 5000iu daily    . clobetasol ointment (TEMOVATE) 4.09 % Apply 1 application topically 2 (two) times daily. Until resolved 30 g 2  . Cyanocobalamin (VITAMIN B 12 PO) Take by mouth.    . cyclobenzaprine (FLEXERIL) 5 MG tablet 1-2 TABLETS  AT BEDTIME, CAN BE INCREASED TO TWICE A DAY AS NEEDED FOR MUSCLE SPASM  2  . Diclofenac Sodium 1.5 % SOLN Apply to painful joint twice daily 150 mL 3  . dicyclomine (BENTYL) 20 MG tablet Take 1 tablet (20 mg total) by mouth 3 (three) times daily before meals. 90 tablet 3  . Docusate Calcium (STOOL SOFTENER PO) Take by mouth.    . EPINEPHrine (EPIPEN) 0.3 mg/0.3 mL DEVI Inject 0.3 mLs (0.3 mg total) into the muscle once. (Patient taking differently: Inject 0.3 mg into the muscle as needed. ) 1 Device 5  . hydrOXYzine (ATARAX/VISTARIL) 25 MG tablet TAKE 1 TABLET (25 MG TOTAL) BY MOUTH 3 (THREE) TIMES DAILY AS NEEDED FOR ITCHING. 90 tablet 3  . Lactulose 20 GM/30ML SOLN 30 ml every 4 hours until constipation is relieved 236 mL 3  . nitroGLYCERIN (NITROSTAT) 0.4 MG SL tablet Place 1 tablet (0.4 mg total) under the tongue every 5 (five) minutes as needed for chest pain. 25 tablet 3  . Omega-3 Fatty Acids (FISH OIL) 1000 MG CAPS Take 1 capsule by mouth daily.    [name] omeprazole (PRILOSEC) 40 MG capsule Take 1 capsule (40 mg total) by mouth daily. 30 capsule 3  . progesterone (PROMETRIUM) 200 MG capsule Take 200 mg by mouth daily.    . propranolol (INDERAL) 10 MG tablet Take 1 tablet (10 mg total) by mouth 3 (three) times daily. 21 tablet 6  .  vitamin C (ASCORBIC ACID) 500 MG tablet Take 500 mg by mouth daily.    [name] HYDROcodone-acetaminophen (NORCO/VICODIN) 5-325 MG tablet Take 1 tablet by mouth at bedtime as needed. For pain 30 tablet 0  . HYDROcodone-acetaminophen (NORCO/VICODIN) 5-325 MG tablet Take 1 tablet by mouth at bedtime as needed. For pain 30 tablet 0  . traMADol (ULTRAM) 50 MG tablet TAKE 1 TABLET BY MOUTH TWICE A DAY AS NEEDED FOR PAIN 60 tablet 2  . dicyclomine (BENTYL) 20 MG tablet TAKE 1 TABLET (20 MG TOTAL) BY MOUTH 3 (THREE) TIMES DAILY BEFORE MEALS. (Patient not taking: Reported on 03/22/2017) 120 tablet 2   No facility-administered medications prior to visit.     Review of  Systems;  Patient denies headache, fevers, malaise, unintentional weight loss, skin rash, eye pain, sinus congestion and sinus pain, sore throat, dysphagia,  hemoptysis , cough, dyspnea, wheezing, chest pain, palpitations, orthopnea, edema, abdominal pain, nausea, melena, flank pain, dysuria, hematuria, urinary  Frequency, nocturia, numbness, tingling, seizures,  Focal weakness, Loss of consciousness,  Tremor, insomnia, depression, anxiety, and suicidal ideation.      Objective:  BP 132/84 (BP Location: Left Arm, Patient Position: Sitting, Cuff Size: Normal)   Pulse 95   Temp 98.2 F (36.8 C) (Oral)   Resp 14   Ht '5\' 3"'$  (1.6 m)   Wt 159 lb 12.8 oz (72.5 kg)   SpO2 96%   BMI 28.31 kg/m   BP Readings from Last 3 Encounters:  03/22/17 132/84  12/14/16 (!) 150/84  12/07/16 128/84    Wt Readings from Last 3 Encounters:  03/22/17 159 lb 12.8 oz (72.5 kg)  12/14/16 157 lb 12.8 oz (71.6 kg)  12/07/16 157 lb 12.8 oz (71.6 kg)    General appearance: alert, cooperative and appears stated age Ears: normal TM's and external ear canals both ears Throat: lips, mucosa, and tongue normal; teeth and gums normal Neck: no adenopathy, no carotid bruit, supple, symmetrical, trachea midline and thyroid not enlarged, symmetric, no tenderness/mass/nodules Back: symmetric, no curvature. ROM normal. No CVA tenderness. Lungs: clear to auscultation bilaterally Heart: regular rate and rhythm, S1, S2 normal, no murmur, click, rub or gallop Abdomen: soft, non-tender; bowel sounds normal; no masses,  no organomegaly Pulses: 2+ and symmetric Skin: Skin color, texture, turgor normal. No rashes or lesions Lymph nodes: Cervical, supraclavicular, and axillary nodes normal.  Lab Results  Component Value Date   HGBA1C 5.1 10/26/2015   HGBA1C 5.4 11/25/2013    Lab Results  Component Value Date   CREATININE 1.11 (H) 03/13/2017   CREATININE 1.03 08/31/2016   CREATININE 1.03 (H) 03/27/2016    Lab Results   Component Value Date   WBC 10.1 12/05/2016   HGB 10.4 (L) 12/05/2016   HCT 31.2 (L) 12/05/2016   PLT 283 12/05/2016   GLUCOSE 104 (H) 03/13/2017   CHOL 152 10/26/2015   TRIG 109 10/26/2015   HDL 32 (L) 10/26/2015   LDLDIRECT 104 (H) 08/15/2012   LDLCALC 98 10/26/2015   ALT 15 03/13/2017   AST 18 03/13/2017   NA 138 03/13/2017   K 4.4 03/13/2017   CL 102 03/13/2017   CREATININE 1.11 (H) 03/13/2017   BUN 11 03/13/2017   CO2 21 03/13/2017   TSH 0.915 10/26/2015   INR 1.0 09/01/2014   HGBA1C 5.1 10/26/2015    Mr Lumbar Spine Wo Contrast  Result Date: 06/10/2016 CLINICAL DATA:  Low back pain and right leg pain EXAM: MRI LUMBAR SPINE WITHOUT CONTRAST TECHNIQUE: Multiplanar, multisequence  MR imaging of the lumbar spine was performed. No intravenous contrast was administered. COMPARISON:  None. FINDINGS: Segmentation:  Normal Alignment:  Normal Vertebrae: There is diffusely heterogeneous bone marrow signal throughout the lumbar spine and sacrum. No acute progression fracture, evidence of discitis osteomyelitis or facet edema. Conus medullaris: Extends to the upper L2 level and appears normal. Paraspinal and other soft tissues: The visualized aorta, IVC and iliac vessels are normal. The visualized retroperitoneal organs and paraspinal soft tissues are normal. Disc levels: T12-L1: Normal disc space facets. No spinal canal or neural foraminal stenosis. L1-L2: Normal disc space and facets. No spinal canal or neuroforaminal stenosis. L2-L3: Normal disc space and facets. No spinal canal or neuroforaminal stenosis. L3-L4: Normal disc space and facets. No spinal canal or neuroforaminal stenosis. L4-L5: Disc desiccation with left eccentric bulge. No spinal canal stenosis. Mild left foraminal narrowing.: L5-S1: Small central disc protrusion. No spinal canal or neural foraminal stenosis. IMPRESSION: 1. Diffusely heterogeneous bone marrow signal intensity. This may be seen in the setting of chronic anemia or  in smokers; however, the appearance is also compatible with marrow replacement disorders, including multiple myeloma. Recommend correlation with laboratory studies. 2. Mild lower lumbar degenerative disc disease with mild left foraminal narrowing at L4-L5. 3. No findings to explain the reported right lower extremity symptoms. Electronically Signed   By: Ulyses Jarred M.D.   On: 06/10/2016 01:12    Assessment & Plan:   Problem List Items Addressed This Visit    Chronic lower back pain    MRI of lumbar spine don ein 2018 did not show spinal stenosis,   Continue  use of vicodin once daily at bedtime only. Suspend celebrex and continue tramadol for  daytime use.   Refill history confirmed via [place] Controlled Substance databas, accessed by me today.   Refills  Dated fr October , November, and December  given      Relevant Medications   HYDROcodone-acetaminophen (NORCO/VICODIN) 5-325 MG tablet   HYDROcodone-acetaminophen (NORCO/VICODIN) 5-325 MG tablet   traMADol (ULTRAM) 50 MG tablet   Diverticulosis    By screening colonoscopy 2014 ([facility])  . No polyps      Irritable bowel syndrome    Confirmed with recent GI evaluation ([facility] 2018) dexilant, dicyclomine and high fiber diet        Other Visit Diagnoses    Breast cancer screening    -  Primary   Relevant Orders   MM SCREENING BREAST TOMO BILATERAL   Other secondary osteoarthritis of both shoulders       Relevant Medications   HYDROcodone-acetaminophen (NORCO/VICODIN) 5-325 MG tablet   HYDROcodone-acetaminophen (NORCO/VICODIN) 5-325 MG tablet   traMADol (ULTRAM) 50 MG tablet   Decreased GFR       Relevant Orders   Basic metabolic panel      I have changed Ms. Knittle's traMADol. I am also having her maintain her Cholecalciferol (VITAMIN D PO), Calcium Carbonate-Vit D-Min (CALCIUM 1200 PO), vitamin C, Fish Oil, progesterone, Docusate Calcium (STOOL SOFTENER PO), EPINEPHrine, Cyanocobalamin (VITAMIN B 12 PO), hydrOXYzine, clobetasol  ointment, Diclofenac Sodium, nitroGLYCERIN, propranolol, Lactulose, cyclobenzaprine, omeprazole, dicyclomine, celecoxib, buPROPion, HYDROcodone-acetaminophen, and HYDROcodone-acetaminophen.  Meds ordered this encounter  Medications  . DISCONTD: HYDROcodone-acetaminophen (NORCO/VICODIN) 5-325 MG tablet    Sig: Take 1 tablet by mouth at bedtime as needed. For pain    Dispense:  30 tablet    Refill:  0    MAY REFILL ON OR AFTER April 22 2017  . HYDROcodone-acetaminophen (NORCO/VICODIN) 5-325 MG tablet  Sig: Take 1 tablet by mouth at bedtime as needed. For pain    Dispense:  31 tablet    Refill:  0    May refiill on or after  March 22 2017  . HYDROcodone-acetaminophen (NORCO/VICODIN) 5-325 MG tablet    Sig: Take 1 tablet by mouth at bedtime as needed. For pain    Dispense:  31 tablet    Refill:  0    MAY REFILL ON OR AFTER May 22 2017  . traMADol (ULTRAM) 50 MG tablet    Sig: Take 1 tablet (50 mg total) by mouth 2 (two) times daily as needed. for pain    Dispense:  60 tablet    Refill:  5    Not to exceed 3 additional fills before 05/29/2017   A total of 25 minutes of face to face time was spent with patient more than half of which was spent in counselling about the above mentioned conditions  and coordination of care  Medications Discontinued During This Encounter  Medication Reason  . dicyclomine (BENTYL) 20 MG tablet Duplicate  . HYDROcodone-acetaminophen (NORCO/VICODIN) 5-325 MG tablet Reorder  . HYDROcodone-acetaminophen (NORCO/VICODIN) 5-325 MG tablet Reorder  . HYDROcodone-acetaminophen (NORCO/VICODIN) 5-325 MG tablet Reorder  . traMADol (ULTRAM) 50 MG tablet Reorder    Follow-up: Return in about 3 months (around 06/22/2017) for MED REFILL .   Crecencio Mc, MD

## 2017-03-22 NOTE — Patient Instructions (Addendum)
Please suspend the celebrex for the next week and have your BMET repeated to see if your kidney function returns to normal  MAMMOGRAM HAS BEEN ORDERED

## 2017-03-24 NOTE — Assessment & Plan Note (Signed)
MRI of lumbar spine don ein 2018 did not show spinal stenosis,   Continue  use of vicodin once daily at bedtime only. Suspend celebrex and continue tramadol for  daytime use.   Refill history confirmed via [place] Controlled Substance databas, accessed by me today.   Refills  Dated fr October , November, and December  given

## 2017-03-24 NOTE — Assessment & Plan Note (Signed)
Confirmed with recent GI evaluation ([facility] 2018) dexilant, dicyclomine and high fiber diet

## 2017-03-24 NOTE — Assessment & Plan Note (Signed)
By screening colonoscopy 2014 ([facility])  . No polyps

## 2017-04-01 ENCOUNTER — Encounter: Payer: Self-pay | Admitting: Internal Medicine

## 2017-04-02 LAB — BASIC METABOLIC PANEL
BUN/Creatinine Ratio: 9 — ABNORMAL LOW (ref 12–28)
BUN: 10 mg/dL (ref 8–27)
CO2: 21 mmol/L (ref 20–29)
Calcium: 8.9 mg/dL (ref 8.7–10.3)
Chloride: 104 mmol/L (ref 96–106)
Creatinine, Ser: 1.08 mg/dL — ABNORMAL HIGH (ref 0.57–1.00)
GFR calc Af Amer: 64 mL/min/{1.73_m2} (ref >59)
GFR calc non Af Amer: 56 mL/min/{1.73_m2} — ABNORMAL LOW (ref >59)
Glucose: 95 mg/dL (ref 65–99)
Potassium: 4 mmol/L (ref 3.5–5.2)
Sodium: 139 mmol/L (ref 134–144)

## 2017-04-03 ENCOUNTER — Other Ambulatory Visit: Payer: Self-pay | Admitting: Internal Medicine

## 2017-04-03 DIAGNOSIS — N183 Chronic kidney disease, stage 3 unspecified: Secondary | ICD-10-CM

## 2017-04-03 DIAGNOSIS — N189 Chronic kidney disease, unspecified: Secondary | ICD-10-CM | POA: Insufficient documentation

## 2017-04-03 DIAGNOSIS — N182 Chronic kidney disease, stage 2 (mild): Secondary | ICD-10-CM

## 2017-04-03 DIAGNOSIS — N289 Disorder of kidney and ureter, unspecified: Secondary | ICD-10-CM | POA: Insufficient documentation

## 2017-04-03 NOTE — Assessment & Plan Note (Signed)
No change in gfr with suspension of celebrex,  Do not resume,  Nephrology referral  advised

## 2017-04-10 ENCOUNTER — Other Ambulatory Visit: Payer: Self-pay | Admitting: Internal Medicine

## 2017-05-10 ENCOUNTER — Ambulatory Visit
Admission: RE | Admit: 2017-05-10 | Discharge: 2017-05-10 | Disposition: A | Discharge status: Home / Self Care | Payer: 59 | Source: Ambulatory Visit | Attending: Internal Medicine | Admitting: Internal Medicine

## 2017-05-10 DIAGNOSIS — Z1231 Encounter for screening mammogram for malignant neoplasm of breast: Secondary | ICD-10-CM | POA: Insufficient documentation

## 2017-05-10 DIAGNOSIS — Z1239 Encounter for other screening for malignant neoplasm of breast: Secondary | ICD-10-CM

## 2017-06-03 ENCOUNTER — Other Ambulatory Visit: Payer: Self-pay | Admitting: Otolaryngology

## 2017-06-03 DIAGNOSIS — H905 Unspecified sensorineural hearing loss: Secondary | ICD-10-CM

## 2017-06-06 ENCOUNTER — Other Ambulatory Visit
Admission: RE | Admit: 2017-06-06 | Discharge: 2017-06-06 | Disposition: A | Discharge status: Home / Self Care | Payer: 59 | Source: Ambulatory Visit | Attending: Otolaryngology | Admitting: Otolaryngology

## 2017-06-06 ENCOUNTER — Ambulatory Visit
Admission: RE | Admit: 2017-06-06 | Discharge: 2017-06-06 | Disposition: A | Discharge status: Home / Self Care | Payer: 59 | Source: Ambulatory Visit | Attending: Otolaryngology | Admitting: Otolaryngology

## 2017-06-06 DIAGNOSIS — H905 Unspecified sensorineural hearing loss: Secondary | ICD-10-CM | POA: Insufficient documentation

## 2017-06-06 LAB — CREATININE, SERUM
Creatinine, Ser: 0.98 mg/dL (ref 0.44–1.00)
GFR calc Af Amer: >60 mL/min (ref >60)
GFR calc non Af Amer: >60 mL/min (ref >60)

## 2017-06-06 MED ORDER — GADOBENATE DIMEGLUMINE 529 MG/ML IV SOLN
14.0000 mL | Freq: Once | INTRAVENOUS | Status: AC | PRN
  Administered 2017-06-06: 14 mL via INTRAVENOUS

## 2017-06-14 ENCOUNTER — Inpatient Hospital Stay: Payer: 59 | Attending: Internal Medicine

## 2017-06-14 DIAGNOSIS — D563 Thalassemia minor: Secondary | ICD-10-CM | POA: Diagnosis not present

## 2017-06-14 DIAGNOSIS — Z87891 Personal history of nicotine dependence: Secondary | ICD-10-CM | POA: Insufficient documentation

## 2017-06-14 DIAGNOSIS — E079 Disorder of thyroid, unspecified: Secondary | ICD-10-CM | POA: Insufficient documentation

## 2017-06-14 DIAGNOSIS — Z87440 Personal history of urinary (tract) infections: Secondary | ICD-10-CM | POA: Insufficient documentation

## 2017-06-14 DIAGNOSIS — R51 Headache: Secondary | ICD-10-CM | POA: Diagnosis not present

## 2017-06-14 DIAGNOSIS — R918 Other nonspecific abnormal finding of lung field: Secondary | ICD-10-CM | POA: Diagnosis not present

## 2017-06-14 DIAGNOSIS — Z79899 Other long term (current) drug therapy: Secondary | ICD-10-CM | POA: Insufficient documentation

## 2017-06-14 DIAGNOSIS — D649 Anemia, unspecified: Secondary | ICD-10-CM | POA: Diagnosis present

## 2017-06-14 DIAGNOSIS — Z9049 Acquired absence of other specified parts of digestive tract: Secondary | ICD-10-CM | POA: Insufficient documentation

## 2017-06-14 DIAGNOSIS — I447 Left bundle-branch block, unspecified: Secondary | ICD-10-CM | POA: Insufficient documentation

## 2017-06-14 DIAGNOSIS — M129 Arthropathy, unspecified: Secondary | ICD-10-CM | POA: Diagnosis not present

## 2017-06-14 DIAGNOSIS — Z90710 Acquired absence of both cervix and uterus: Secondary | ICD-10-CM | POA: Insufficient documentation

## 2017-06-14 DIAGNOSIS — D509 Iron deficiency anemia, unspecified: Secondary | ICD-10-CM

## 2017-06-14 LAB — IRON AND TIBC
Iron: 81 ug/dL (ref 28–170)
Saturation Ratios: 29 % (ref 10.4–31.8)
TIBC: 278 ug/dL (ref 250–450)
UIBC: 197 ug/dL

## 2017-06-14 LAB — CBC WITH DIFFERENTIAL/PLATELET
Basophils Absolute: 0.1 10*3/uL (ref 0–0.1)
Basophils Relative: 1 %
Eosinophils Absolute: 0.2 10*3/uL (ref 0–0.7)
Eosinophils Relative: 2 %
HCT: 34.5 % — ABNORMAL LOW (ref 35.0–47.0)
Hemoglobin: 11 g/dL — ABNORMAL LOW (ref 12.0–16.0)
Lymphocytes Relative: 27 %
Lymphs Abs: 2.7 10*3/uL (ref 1.0–3.6)
MCH: 21.6 pg — ABNORMAL LOW (ref 26.0–34.0)
MCHC: 31.9 g/dL — ABNORMAL LOW (ref 32.0–36.0)
MCV: 67.9 fL — ABNORMAL LOW (ref 80.0–100.0)
Monocytes Absolute: 0.7 10*3/uL (ref 0.2–0.9)
Monocytes Relative: 7 %
Neutro Abs: 6.2 10*3/uL (ref 1.4–6.5)
Neutrophils Relative %: 63 %
Platelets: 270 10*3/uL (ref 150–440)
RBC: 5.08 MIL/uL (ref 3.80–5.20)
RDW: 16.1 % — ABNORMAL HIGH (ref 11.5–14.5)
WBC: 9.9 10*3/uL (ref 3.6–11.0)

## 2017-06-14 LAB — FERRITIN: Ferritin: 187 ng/mL (ref 11–307)

## 2017-06-21 ENCOUNTER — Inpatient Hospital Stay: Payer: 59

## 2017-06-21 ENCOUNTER — Inpatient Hospital Stay (HOSPITAL_BASED_OUTPATIENT_CLINIC_OR_DEPARTMENT_OTHER): Payer: 59 | Admitting: Internal Medicine

## 2017-06-21 VITALS — BP 145/85 | HR 92 | Temp 98.1°F | Resp 16 | Wt 163.8 lb

## 2017-06-21 DIAGNOSIS — D649 Anemia, unspecified: Secondary | ICD-10-CM

## 2017-06-21 DIAGNOSIS — R918 Other nonspecific abnormal finding of lung field: Secondary | ICD-10-CM | POA: Diagnosis not present

## 2017-06-21 DIAGNOSIS — Z79899 Other long term (current) drug therapy: Secondary | ICD-10-CM

## 2017-06-21 DIAGNOSIS — Z9049 Acquired absence of other specified parts of digestive tract: Secondary | ICD-10-CM | POA: Diagnosis not present

## 2017-06-21 DIAGNOSIS — Z87440 Personal history of urinary (tract) infections: Secondary | ICD-10-CM | POA: Diagnosis not present

## 2017-06-21 DIAGNOSIS — M129 Arthropathy, unspecified: Secondary | ICD-10-CM | POA: Diagnosis not present

## 2017-06-21 DIAGNOSIS — E079 Disorder of thyroid, unspecified: Secondary | ICD-10-CM | POA: Diagnosis not present

## 2017-06-21 DIAGNOSIS — Z87891 Personal history of nicotine dependence: Secondary | ICD-10-CM

## 2017-06-21 DIAGNOSIS — I447 Left bundle-branch block, unspecified: Secondary | ICD-10-CM | POA: Diagnosis not present

## 2017-06-21 DIAGNOSIS — D509 Iron deficiency anemia, unspecified: Secondary | ICD-10-CM

## 2017-06-21 DIAGNOSIS — R51 Headache: Secondary | ICD-10-CM | POA: Diagnosis not present

## 2017-06-21 DIAGNOSIS — D563 Thalassemia minor: Secondary | ICD-10-CM | POA: Diagnosis not present

## 2017-06-21 DIAGNOSIS — Z90710 Acquired absence of both cervix and uterus: Secondary | ICD-10-CM

## 2017-06-21 NOTE — Assessment & Plan Note (Addendum)
#   Chronic microcytic anemia/history of beta thalassemia - out of proportion to her anemia. S/p IV venoferx4 in fall of 2017.  # Improved symptoms. Hb today 11;  Iron sat- 29%. Recommend continued dietary intervention.  No plans for IV iron at this time.  # Lung nodules- incidental on scans in march 2017; will order CT scan noncontrast today.  # recheck labs in 6 months- 1 week prior; venofer IV possible.; call with reuslts.   Addendum: Please inform pt that her Feb 2019- CT scan lung continues to show small lung nodules which have not changed since 2017 considered benign. follow up as planned.   Cc; Dr.Tullo.

## 2017-06-21 NOTE — Progress Notes (Signed)
[facility] CONSULT NOTE  Patient Care Team: Sherlene Shams, MD as PCP - General (Internal Medicine) Lemar Livings Merrily Pew, MD as Consulting Physician (General Surgery)  CHIEF COMPLAINTS/PURPOSE OF CONSULTATION:    HEMATOLOGY HISTORY  # CHRONIC MICROCYTIC ANEMIA- BETA THALASSEMIA MINOR [colo- 2016; Dr. Fanny Skates July 2017- IV trial of Venofer x4   # Lung nodules [CT- sub cm Jan 2017]- followed by PCP  HISTORY OF PRESENTING ILLNESS:  Cheryl [name] 62 y.o.  female with a history of beta thalassemia minor; also iron deficiency anemia-is here for follow-up.  Patient energy levels are adequate.  Denies any nausea vomiting.  Denies any abdominal pain.  Denies any fatigue. She denies any chest pain or shortness of breath or cough.   ROS: A complete 10 point review of system is done which is negative except mentioned above in history of present illness  MEDICAL HISTORY:  Past Medical History:  Diagnosis Date  . 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 Arrhythmia    left bundle branch block  . Arthritis   . Blood in stool   . Chicken pox   . Generalized headaches   . History of blood transfusion   . LBBB (left bundle branch block)   . Thyroid disease   . UTI (lower urinary tract infection)     SURGICAL HISTORY: Past Surgical History:  Procedure Laterality Date  . ABDOMINAL HYSTERECTOMY  1997  . APPENDECTOMY  1981  . BREAST BIOPSY Bilateral 1976   neg  . BREAST SURGERY  1976  . CARDIAC CATHETERIZATION  2004   [facility]  . CARDIAC CATHETERIZATION  2006   [facility]  . cystic fibrosis tumor removal  1983  . THUMB AMPUTATION  1992   traumatic  . TONSILLECTOMY AND ADENOIDECTOMY  1964    SOCIAL HISTORY: Social History   Socioeconomic History  . Marital status: Married    Spouse name: Not on file  . Number of children: Not on file  .  Years of education: Not on file  . Highest education level: Not on file  Social Needs  . Financial resource strain: Not on file  . Food insecurity - worry: Not on file  . Food insecurity - inability: Not on file  . Transportation needs - medical: Not on file  . Transportation needs - non-medical: Not on file  Occupational History  . Not on file  Tobacco Use  . Smoking status: Former Smoker    Packs/day: 0.25    Years: 34.00    Pack years: 8.50    Types: Cigarettes    Last attempt to quit: 04/10/2013    Years since quitting: 4.2  . Smokeless tobacco: Never Used  Substance and Sexual Activity  . Alcohol use: Yes    Comment: occasional  . Drug use: No  . Sexual activity: Not on file  Other Topics Concern  . Not on file  Social History Narrative   Married to [employer]   Works for [employer], Engineer, structural    FAMILY HISTORY: Family History  Problem Relation Age of Onset  . Heart disease Mother   . Arthritis Mother   . Cancer Mother        breast  . Hyperlipidemia Mother   . Hypertension Mother   . Heart attack Mother   . Breast cancer Mother 46  . Heart disease Father   .  Cancer Father        hodgkins disease, prostate  . Diabetes Father   . Arthritis Father   . Hypertension Father   . Heart disease Sister   . Diabetes Sister   . Heart disease Brother [id number]       ami x 8,  4 vessel CABG   . Diabetes Brother   . Liver disease Brother   . Lung disease Brother   . Heart disease Maternal Grandmother   . Diabetes Maternal Grandmother   . Heart disease Maternal Grandfather   . Heart disease Paternal Grandmother   . Diabetes Paternal Grandmother   . Stroke Paternal Grandfather   . Heart disease Paternal Grandfather   . Diabetes Paternal Grandfather   . Diabetes Maternal Aunt     ALLERGIES:  is allergic to peanuts [peanut oil]; penicillins; sulfa antibiotics; influenza vac split [flu virus vaccine]; mobic [meloxicam]; and nickel.  MEDICATIONS:  Current  Outpatient Medications  Medication Sig Dispense Refill  . buPROPion (WELLBUTRIN SR) 100 MG 12 hr tablet TAKE 1 TABLET BY MOUTH TWO  TIMES DAILY 180 tablet 1  . Calcium Carbonate-Vit D-Min (CALCIUM 1200 PO) Take 1 tablet by mouth daily.    . celecoxib (CELEBREX) 200 MG capsule TAKE 1 CAPSULE (200 MG TOTAL) BY MOUTH 2 (TWO) TIMES DAILY. 180 capsule 0  . Cholecalciferol (VITAMIN D PO) Take by mouth. Pt takes 5000iu daily    . clobetasol ointment (TEMOVATE) 0.05 % Apply 1 application topically 2 (two) times daily. Until resolved 30 g 2  . Cyanocobalamin (VITAMIN B 12 PO) Take by mouth.    . cyclobenzaprine (FLEXERIL) 5 MG tablet 1-2 TABLETS AT BEDTIME, CAN BE INCREASED TO TWICE A DAY AS NEEDED FOR MUSCLE SPASM  2  . Diclofenac Sodium 1.5 % SOLN Apply to painful joint twice daily 150 mL 3  . dicyclomine (BENTYL) 20 MG tablet Take 1 tablet (20 mg total) by mouth 3 (three) times daily before meals. 90 tablet 3  . Docusate Calcium (STOOL SOFTENER PO) Take by mouth.    . EPINEPHrine (EPIPEN) 0.3 mg/0.3 mL DEVI Inject 0.3 mLs (0.3 mg total) into the muscle once. (Patient taking differently: Inject 0.3 mg into the muscle as needed. ) 1 Device 5  . HYDROcodone-acetaminophen (NORCO/VICODIN) 5-325 MG tablet Take 1 tablet by mouth at bedtime as needed. For pain 31 tablet 0  . HYDROcodone-acetaminophen (NORCO/VICODIN) 5-325 MG tablet Take 1 tablet by mouth at bedtime as needed. For pain 31 tablet 0  . hydrOXYzine (ATARAX/VISTARIL) 25 MG tablet TAKE 1 TABLET (25 MG TOTAL) BY MOUTH 3 (THREE) TIMES DAILY AS NEEDED FOR ITCHING. 90 tablet 3  . Lactulose 20 GM/30ML SOLN 30 ml every 4 hours until constipation is relieved 236 mL 3  . nitroGLYCERIN (NITROSTAT) 0.4 MG SL tablet Place 1 tablet (0.4 mg total) under the tongue every 5 (five) minutes as needed for chest pain. 25 tablet 3  . Omega-3 Fatty Acids (FISH OIL) 1000 MG CAPS Take 1 capsule by mouth daily.    [name]. omeprazole (PRILOSEC) 40 MG capsule Take 1 capsule (40 mg  total) by mouth daily. 30 capsule 3  . progesterone (PROMETRIUM) 200 MG capsule Take 200 mg by mouth daily.    . propranolol (INDERAL) 10 MG tablet Take 1 tablet (10 mg total) by mouth 3 (three) times daily. 21 tablet 6  . traMADol (ULTRAM) 50 MG tablet Take 1 tablet (50 mg total) by mouth 2 (two) times daily as needed. for pain 60  tablet 5  . vitamin C (ASCORBIC ACID) 500 MG tablet Take 500 mg by mouth daily.     No current facility-administered medications for this visit.       [name]  PHYSICAL EXAMINATION:   Vitals:   06/21/17 1351  BP: (!) 145/85  Pulse: 92  Resp: 16  Temp: 98.1 F (36.7 C)   Filed Weights   06/21/17 1351  Weight: 163 lb 12.8 oz (74.3 kg)    GENERAL: Well-nourished well-developed; Alert, no distress and comfortable.  Alone.  EYES: no pallor or icterus OROPHARYNX: no thrush or ulceration; good dentition  NECK: supple, no masses felt LYMPH:  no palpable lymphadenopathy in the cervical, axillary or inguinal regions LUNGS: clear to auscultation and  No wheeze or crackles HEART/CVS: regular rate & rhythm and no murmurs; No lower extremity edema ABDOMEN: abdomen soft, non-tender and normal bowel sounds Musculoskeletal:no cyanosis of digits and no clubbing  PSYCH: alert & oriented x 3 with fluent speech NEURO: no focal motor/sensory deficits SKIN:  no rashes or significant lesions  LABORATORY DATA:  I have reviewed the data as listed Lab Results  Component Value Date   WBC 9.9 06/14/2017   HGB 11.0 (L) 06/14/2017   HCT 34.5 (L) 06/14/2017   MCV 67.9 (L) 06/14/2017   PLT 270 06/14/2017   Recent Labs    08/31/16 1512 03/13/17 1634 04/01/17 1712 06/06/17 1429  NA 138 138 139  --   K 4.2 4.4 4.0  --   CL 107 102 104  --   CO2 27 21 21   --   GLUCOSE 89 104* 95  --   BUN 12 11 10   --   CREATININE 1.03 1.11* 1.08* 0.98  CALCIUM 9.3 9.6 8.9  --   GFRNONAA  --  54* 56* >60  GFRAA  --  62 64 >60  PROT 6.7 6.7  --   --   ALBUMIN 4.4 4.5  --   --    AST 18 18  --   --   ALT 18 15  --   --   ALKPHOS 68 81  --   --   BILITOT 0.8 0.5  --   --      Mr Melven Sartorius Wo Contrast  Result Date: 06/06/2017 CLINICAL DATA:  62 y/o F; left ear sensorineural hearing loss for 2 years. Headache and dizziness. EXAM: MRI HEAD WITHOUT AND WITH CONTRAST TECHNIQUE: Multiplanar, multiecho pulse sequences of the brain and surrounding structures were obtained without and with intravenous contrast. Internal auditory canal protocol. CONTRAST:  14mL MULTIHANCE GADOBENATE DIMEGLUMINE 529 MG/ML IV SOLN COMPARISON:  None. FINDINGS: Internal auditory canal: No mass or abnormal enhancement. Cranial nerve 7 and 8 complexes are intact. Inner ear structures are morphologically normal and demonstrate normal signal. No significant neurovascular compression. Brain: No acute infarction, hemorrhage, hydrocephalus, extra-axial collection or mass lesion. No abnormal enhancement. Vascular: Normal flow voids. Skull and upper cervical spine: Normal marrow signal. Sinuses/Orbits: Mild maxillary and sphenoid sinus mucosal thickening. Otherwise no abnormal signal of paranasal sinuses or mastoid air cells. Orbits are unremarkable. Other: None. IMPRESSION: 1. No mass or abnormal enhancement of the cranial nerve 7 and 8 complexes. Normal morphology and signal of the inner ear structures. 2. Unremarkable MRI of the brain. 3. Mild paranasal sinus disease. Electronically Signed   By: Mitzi Hansen M.D.   On: 06/06/2017 16:20    ASSESSMENT & PLAN:   Microcytic anemia # Chronic microcytic anemia/history of beta thalassemia - out  of proportion to her anemia. S/p IV venoferx4 in fall of 2017.  # Improved symptoms. Hb today 11;  Iron sat- 29%. Recommend continued dietary intervention.  No plans for IV iron at this time.  # Lung nodules- incidental on scans in march 2017; will order CT scan noncontrast today.  # recheck labs in 6 months- 1 week prior; venofer IV possible.; call with  reuslts.   Cc; Dr.Tullo.    Earna Coder, MD 06/25/2017 4:25 PM

## 2017-06-27 ENCOUNTER — Ambulatory Visit: Payer: 59

## 2017-06-28 ENCOUNTER — Encounter: Payer: Self-pay | Admitting: Internal Medicine

## 2017-06-28 ENCOUNTER — Ambulatory Visit (INDEPENDENT_AMBULATORY_CARE_PROVIDER_SITE_OTHER): Payer: 59 | Admitting: Internal Medicine

## 2017-06-28 VITALS — BP 124/76 | HR 89 | Temp 97.9°F | Resp 16 | Ht 63.0 in | Wt 163.8 lb

## 2017-06-28 DIAGNOSIS — M545 Low back pain, unspecified: Secondary | ICD-10-CM

## 2017-06-28 DIAGNOSIS — I10 Essential (primary) hypertension: Secondary | ICD-10-CM | POA: Diagnosis not present

## 2017-06-28 DIAGNOSIS — M19212 Secondary osteoarthritis, left shoulder: Secondary | ICD-10-CM | POA: Diagnosis not present

## 2017-06-28 DIAGNOSIS — M19211 Secondary osteoarthritis, right shoulder: Secondary | ICD-10-CM

## 2017-06-28 DIAGNOSIS — I129 Hypertensive chronic kidney disease with stage 1 through stage 4 chronic kidney disease, or unspecified chronic kidney disease: Secondary | ICD-10-CM

## 2017-06-28 DIAGNOSIS — D508 Other iron deficiency anemias: Secondary | ICD-10-CM | POA: Diagnosis not present

## 2017-06-28 DIAGNOSIS — G4733 Obstructive sleep apnea (adult) (pediatric): Secondary | ICD-10-CM

## 2017-06-28 DIAGNOSIS — G8929 Other chronic pain: Secondary | ICD-10-CM

## 2017-06-28 MED ORDER — HYDROCODONE-ACETAMINOPHEN 5-325 MG PO TABS: 1.0000 | ORAL_TABLET | Freq: Every evening | ORAL | 0 refills | Status: DC | PRN

## 2017-06-28 MED ORDER — TRAMADOL HCL 50 MG PO TABS: 50.0000 mg | ORAL_TABLET | Freq: Two times a day (BID) | ORAL | 5 refills | Status: DC | PRN

## 2017-06-28 NOTE — Patient Instructions (Addendum)
This is  Dr. Melina Schools  example of a  "Low GIycemic index "  Diet:  It will allow you to lose 4 to 8  lbs  per month if you follow it carefully.  Your goal with exercise is a minimum of 30 minutes of aerobic exercise 5 days per week (Walking does not count once it becomes easy!)    All of the foods can be found at grocery stores and in bulk at [employer].  The Atkins protein bars and shakes are available in more varieties at Target, [employer] and [employer]'s Foods.     7 AM Breakfast:  Choose from the following:  Low carbohydrate Protein  Shakes (I recommend the  Premier Protein chocolate shakes,  EAS AdvantEdge "Carb Control" shakes  Or the Atkins shakes all are under 3 net carbs)     a scrambled egg/bacon/cheese burrito made with Mission's "carb balance" whole wheat tortilla  (about 10 net carbs ) or Joseph's flatbread   Medical laboratory scientific officer (basically a quiche without the pastry crust) and an "Egg sandwhich"  that  Can be microwaved in 2 minutes and are  very convenient way to get your eggs.  8 carbs)     Avoid cereal and bananas, oatmeal and cream of wheat and grits. They are loaded with carbohydrates!   10 AM: high protein snack: should be 100 cal or < 10 net carbs   Protein bar by Atkins (the snack size, under 200 cal, usually < 6 net carbs).    A stick of cheese:  Around 1 carb,  100 cal     Dannon Light n Fit [place] Yogurt  (80 cal, 8 carbs)  Other so called "protein bars" and Greek yogurts tend to be loaded with carbohydrates.  Remember, in food advertising, the word "energy" is synonymous for " carbohydrate."  Lunch:   A Sandwich using the bread choices listed, Can use any  Eggs,  lunchmeat, grilled meat or canned tuna), avocado, regular mayo/mustard  and cheese.  A Salad using blue cheese, ranch,  Goddess or vinagrette,  Avoid taco shells, croutons or "confetti" and no "candied nuts" but regular nuts OK.   No pretzels, nabs  or chips.  Pickles and miniature sweet peppers  are a good low carb alternative that provide a "crunch"  The bread is the only source of carbohydrate in a sandwich and  can be decreased by trying some of the attached alternatives to traditional loaf bread   Avoid "Low fat dressings, as well as Reyne Dumas and [employer] dressings They are loaded with sugar!   3 PM/ Mid day  Snack:  Consider  1 ounce of  almonds, walnuts, pistachios, pecans, peanuts,  Macadamia nuts or a nut medley.  Avoid "granola and granola bars "  Mixed nuts are ok in moderation as long as there are no raisins,  cranberries or dried fruit.   KIND bars are OK if you get the low glycemic index variety   Try the prosciutto/mozzarella cheese sticks by Fiorruci  In deli /backery section   High protein      6 PM  Dinner:     Meat/fowl/fish with a green salad, and either broccoli, cauliflower, green beans, spinach, brussel sprouts or  Lima beans. DO NOT BREAD THE PROTEIN!!      There is a low carb pasta by Dreamfield's that is acceptable and tastes great: only 5 digestible carbs/serving.( All grocery stores but BJs carry it ) Several ready made  meals are available low carb:   Try Michel Angelo's chicken piccata or chicken or eggplant parm over low carb pasta.(Lowes and BJs)   Clifton CustardAaron Sanchez's "Carnitas" (pulled pork, no sauce,  0 carbs) or his beef pot roast to make a dinner burrito (at BJ's)  Pesto over low carb pasta (bj's sells a good quality pesto in the center refrigerated section of the deli   Try satueeing  Roosvelt HarpsBok Choy with mushroooms as a good side   [employer] Giant makes a mashed cauliflower that tastes like mashed potatoes  Whole wheat pasta is still full of digestible carbs and  Not as low in glycemic index as Dreamfield's.   Brown rice is still rice,  So skip the rice and noodles if you eat Congohinese or [place]hai (or at least limit to 1/2 cup)  9 PM snack :   Breyer's "low carb" fudgsicle or  ice cream bar (Carb Smart line), or  Weight Watcher's ice cream bar , or another  "no sugar added" ice cream;  a serving of fresh berries/cherries with whipped cream   Cheese or DANNON'S LlGHT N FIT GREEK YOGURT  8 ounces of [place] Diamond unsweetened almond/cococunut milk    Treat yourself to a parfait made with whipped cream blueberiies, walnuts and vanilla greek yogurt  Avoid bananas, pineapple, grapes  and watermelon on a regular basis because they are high in sugar.  THINK OF THEM AS DESSERT  Remember that snack Substitutions should be less than 10 NET carbs per serving and meals < 20 carbs. Remember to subtract fiber grams to get the "net carbs."

## 2017-06-28 NOTE — Progress Notes (Signed)
Subjective:  Patient ID: Cheryl [name], female    DOB: Oct 16, 1955  Age: 62 y.o. MRN: [id number]  CC: The primary encounter diagnosis was Essential hypertension. Diagnoses of Other secondary osteoarthritis of both shoulders, Other iron deficiency anemia, Renal hypertension, Chronic midline low back pain without sciatica, and Sleep apnea, obstructive were also pertinent to this visit.  HPI Cheryl [name] presents for follow up on multiple medical issues including chronic low back pain,  Hypertension, depression,  And CKD.  Back pain:  Patient has been taking 1 hydrocodone every evening and 2 tramadol daily for managment of chronic low back pain .  Celebrex was stopped by nephrology with no change in her pain level. .  Started on losartan 25 mg  By nephrology and celeberex stopped .  Has repeat labs scheduled for feb 21   Has some fluid retention     Outpatient Medications Prior to Visit  Medication Sig Dispense Refill  . buPROPion (WELLBUTRIN SR) 100 MG 12 hr tablet TAKE 1 TABLET BY MOUTH TWO  TIMES DAILY 180 tablet 1  . Calcium Carbonate-Vit D-Min (CALCIUM 1200 PO) Take 1 tablet by mouth daily.    . Cholecalciferol (VITAMIN D PO) Take by mouth. Pt takes 5000iu daily    . clobetasol ointment (TEMOVATE) 0.05 % Apply 1 application topically 2 (two) times daily. Until resolved 30 g 2  . Cyanocobalamin (VITAMIN B 12 PO) Take by mouth.    . cyclobenzaprine (FLEXERIL) 5 MG tablet 1-2 TABLETS AT BEDTIME, CAN BE INCREASED TO TWICE A DAY AS NEEDED FOR MUSCLE SPASM  2  . Diclofenac Sodium 1.5 % SOLN Apply to painful joint twice daily 150 mL 3  . dicyclomine (BENTYL) 20 MG tablet Take 1 tablet (20 mg total) by mouth 3 (three) times daily before meals. 90 tablet 3  . Docusate Calcium (STOOL SOFTENER PO) Take by mouth.    . EPINEPHrine (EPIPEN) 0.3 mg/0.3 mL DEVI Inject 0.3 mLs (0.3 mg total) into the muscle once. (Patient taking differently: Inject 0.3 mg into the muscle as needed. ) 1 Device  5  . hydrOXYzine (ATARAX/VISTARIL) 25 MG tablet TAKE 1 TABLET (25 MG TOTAL) BY MOUTH 3 (THREE) TIMES DAILY AS NEEDED FOR ITCHING. 90 tablet 3  . Lactulose 20 GM/30ML SOLN 30 ml every 4 hours until constipation is relieved 236 mL 3  . nitroGLYCERIN (NITROSTAT) 0.4 MG SL tablet Place 1 tablet (0.4 mg total) under the tongue every 5 (five) minutes as needed for chest pain. 25 tablet 3  . Omega-3 Fatty Acids (FISH OIL) 1000 MG CAPS Take 1 capsule by mouth daily.    [name] omeprazole (PRILOSEC) 40 MG capsule Take 1 capsule (40 mg total) by mouth daily. 30 capsule 3  . progesterone (PROMETRIUM) 200 MG capsule Take 200 mg by mouth daily.    . propranolol (INDERAL) 10 MG tablet Take 1 tablet (10 mg total) by mouth 3 (three) times daily. 21 tablet 6  . vitamin C (ASCORBIC ACID) 500 MG tablet Take 500 mg by mouth daily.    [name] HYDROcodone-acetaminophen (NORCO/VICODIN) 5-325 MG tablet Take 1 tablet by mouth at bedtime as needed. For pain 31 tablet 0  . HYDROcodone-acetaminophen (NORCO/VICODIN) 5-325 MG tablet Take 1 tablet by mouth at bedtime as needed. For pain 31 tablet 0  . traMADol (ULTRAM) 50 MG tablet Take 1 tablet (50 mg total) by mouth 2 (two) times daily as needed. for pain 60 tablet 5  . celecoxib (CELEBREX) 200 MG capsule TAKE 1  CAPSULE (200 MG TOTAL) BY MOUTH 2 (TWO) TIMES DAILY. (Patient not taking: Reported on 06/28/2017) 180 capsule 0  . losartan (COZAAR) 25 MG tablet TK 1 T PO QD  12   No facility-administered medications prior to visit.     Review of Systems;  Patient denies headache, fevers, malaise, unintentional weight loss, skin rash, eye pain, sinus congestion and sinus pain, sore throat, dysphagia,  hemoptysis , cough, dyspnea, wheezing, chest pain, palpitations, orthopnea, edema, abdominal pain, nausea, melena, diarrhea, constipation, flank pain, dysuria, hematuria, urinary  Frequency, nocturia, numbness, tingling, seizures,  Focal weakness, Loss of consciousness,  Tremor, insomnia,  depression, anxiety, and suicidal ideation.      Objective:  BP 124/76   Pulse 89   Temp 97.9 F (36.6 C) (Oral)   Resp 16   Ht 5\' 3"  (1.6 m)   Wt 163 lb 12.8 oz (74.3 kg)   SpO2 94%   BMI 29.02 kg/m   BP Readings from Last 3 Encounters:  06/28/17 124/76  06/21/17 (!) 145/85  03/22/17 132/84    Wt Readings from Last 3 Encounters:  06/28/17 163 lb 12.8 oz (74.3 kg)  06/21/17 163 lb 12.8 oz (74.3 kg)  03/22/17 159 lb 12.8 oz (72.5 kg)    General appearance: alert, cooperative and appears stated age Ears: normal TM's and external ear canals both ears Throat: lips, mucosa, and tongue normal; teeth and gums normal Neck: no adenopathy, no carotid bruit, supple, symmetrical, trachea midline and thyroid not enlarged, symmetric, no tenderness/mass/nodules Back: symmetric, no curvature. ROM normal. No CVA tenderness. Lungs: clear to auscultation bilaterally Heart: regular rate and rhythm, S1, S2 normal, no murmur, click, rub or gallop Abdomen: soft, non-tender; bowel sounds normal; no masses,  no organomegaly Pulses: 2+ and symmetric Skin: Skin color, texture, turgor normal. No rashes or lesions Lymph nodes: Cervical, supraclavicular, and axillary nodes normal.  Lab Results  Component Value Date   HGBA1C 5.1 10/26/2015   HGBA1C 5.4 11/25/2013    Lab Results  Component Value Date   CREATININE 0.99 06/28/2017   CREATININE 0.98 06/06/2017   CREATININE 1.08 (H) 04/01/2017    Lab Results  Component Value Date   WBC 9.9 06/14/2017   HGB 11.0 (L) 06/14/2017   HCT 34.5 (L) 06/14/2017   PLT 270 06/14/2017   GLUCOSE 88 06/28/2017   CHOL 152 10/26/2015   TRIG 109 10/26/2015   HDL 32 (L) 10/26/2015   LDLDIRECT 104 (H) 08/15/2012   LDLCALC 98 10/26/2015   ALT 15 03/13/2017   AST 18 03/13/2017   NA 139 06/28/2017   K 4.7 06/28/2017   CL 103 06/28/2017   CREATININE 0.99 06/28/2017   BUN 13 06/28/2017   CO2 20 06/28/2017   TSH 0.915 10/26/2015   INR 1.0 09/01/2014    HGBA1C 5.1 10/26/2015    Mr Brain/iac W Wo Contrast  Result Date: 06/06/2017 CLINICAL DATA:  62 y/o F; left ear sensorineural hearing loss for 2 years. Headache and dizziness. EXAM: MRI HEAD WITHOUT AND WITH CONTRAST TECHNIQUE: Multiplanar, multiecho pulse sequences of the brain and surrounding structures were obtained without and with intravenous contrast. Internal auditory canal protocol. CONTRAST:  14mL MULTIHANCE GADOBENATE DIMEGLUMINE 529 MG/ML IV SOLN COMPARISON:  None. FINDINGS: Internal auditory canal: No mass or abnormal enhancement. Cranial nerve 7 and 8 complexes are intact. Inner ear structures are morphologically normal and demonstrate normal signal. No significant neurovascular compression. Brain: No acute infarction, hemorrhage, hydrocephalus, extra-axial collection or mass lesion. No abnormal enhancement. Vascular: Normal  flow voids. Skull and upper cervical spine: Normal marrow signal. Sinuses/Orbits: Mild maxillary and sphenoid sinus mucosal thickening. Otherwise no abnormal signal of paranasal sinuses or mastoid air cells. Orbits are unremarkable. Other: None. IMPRESSION: 1. No mass or abnormal enhancement of the cranial nerve 7 and 8 complexes. Normal morphology and signal of the inner ear structures. 2. Unremarkable MRI of the brain. 3. Mild paranasal sinus disease. Electronically Signed   By: Mitzi HansenLance  Furusawa-Stratton M.D.   On: 06/06/2017 16:20    Assessment & Plan:   Problem List Items Addressed This Visit    Chronic lower back pain    MRI of lumbar spine done in 2018 did not show spinal stenosis,   Continue  use of vicodin once daily at bedtime only. Suspend celebrex and continue tramadol for  daytime use.   Refill history confirmed via [place] Controlled Substance databas, accessed by me today.   Refills  Dated for January,  Feb and March 2019      Relevant Medications   HYDROcodone-acetaminophen (NORCO/VICODIN) 5-325 MG tablet   traMADol (ULTRAM) 50 MG tablet   Iron  deficiency anemia    With concurrent thalassemia. Hgb improved with periodic iv infusions by hematolyg  Lab Results  Component Value Date   WBC 9.9 06/14/2017   HGB 11.0 (L) 06/14/2017   HCT 34.5 (L) 06/14/2017   MCV 67.9 (L) 06/14/2017   PLT 270 06/14/2017         Renal hypertension    Improved with initiation of losartan several weeks ago by nephrology.  Needs bmet repeated since initiating ARB therapy.  Done today. Cr and lytes unchanged   Lab Results  Component Value Date   CREATININE 0.99 06/28/2017   Lab Results  Component Value Date   NA 139 06/28/2017   K 4.7 06/28/2017   CL 103 06/28/2017   CO2 20 06/28/2017         Relevant Medications   losartan (COZAAR) 25 MG tablet   Sleep apnea, obstructive    diagnosed with prior sleep study followed by CPAP titration study in 2014,   treatment has been deferred  by patient.  Will discuss the long term history of OSA , the risks of long term damage to heart and the signs and symptoms attributable to OSA and advise  patient to consider  use of CPAP.        Other Visit Diagnoses    Essential hypertension    -  Primary   Relevant Medications   losartan (COZAAR) 25 MG tablet   Other Relevant Orders   Basic Metabolic Panel (BMET) (Completed)   Other secondary osteoarthritis of both shoulders       Relevant Medications   HYDROcodone-acetaminophen (NORCO/VICODIN) 5-325 MG tablet   traMADol (ULTRAM) 50 MG tablet      I am having Cheryl [name] maintain her Cholecalciferol (VITAMIN D PO), Calcium Carbonate-Vit D-Min (CALCIUM 1200 PO), vitamin C, Fish Oil, progesterone, Docusate Calcium (STOOL SOFTENER PO), EPINEPHrine, Cyanocobalamin (VITAMIN B 12 PO), hydrOXYzine, clobetasol ointment, Diclofenac Sodium, nitroGLYCERIN, propranolol, Lactulose, cyclobenzaprine, omeprazole, dicyclomine, buPROPion, celecoxib, losartan, HYDROcodone-acetaminophen, and traMADol.  Meds ordered this encounter  Medications  . DISCONTD: traMADol  (ULTRAM) 50 MG tablet    Sig: Take 1 tablet (50 mg total) by mouth 2 (two) times daily as needed. for pain    Dispense:  60 tablet    Refill:  5    Not to exceed 3 additional fills before 05/29/2017  . DISCONTD: HYDROcodone-acetaminophen (NORCO/VICODIN) 5-325 MG  tablet    Sig: Take 1 tablet by mouth at bedtime as needed. For pain    Dispense:  31 tablet    Refill:  0    May refiill on or after  March 22 2017  . DISCONTD: HYDROcodone-acetaminophen (NORCO/VICODIN) 5-325 MG tablet    Sig: Take 1 tablet by mouth at bedtime as needed. For pain    Dispense:  31 tablet    Refill:  0  . DISCONTD: HYDROcodone-acetaminophen (NORCO/VICODIN) 5-325 MG tablet    Sig: Take 1 tablet by mouth at bedtime as needed. For pain    Dispense:  31 tablet    Refill:  0    May refill on or after July 26 2017  . HYDROcodone-acetaminophen (NORCO/VICODIN) 5-325 MG tablet    Sig: Take 1 tablet by mouth at bedtime as needed. For pain    Dispense:  30 tablet    Refill:  0    May refill on or after August 26 2017  . traMADol (ULTRAM) 50 MG tablet    Sig: Take 1 tablet (50 mg total) by mouth 2 (two) times daily as needed. for pain    Dispense:  60 tablet    Refill:  5    Not to exceed 3 additional fills before 05/29/2017   A total of 25 minutes of face to face time was spent with patient more than half of which was spent in counselling about the above mentioned conditions  and coordination of care  Medications Discontinued During This Encounter  Medication Reason  . traMADol (ULTRAM) 50 MG tablet Reorder  . HYDROcodone-acetaminophen (NORCO/VICODIN) 5-325 MG tablet Reorder  . traMADol (ULTRAM) 50 MG tablet   . HYDROcodone-acetaminophen (NORCO/VICODIN) 5-325 MG tablet   . HYDROcodone-acetaminophen (NORCO/VICODIN) 5-325 MG tablet Reorder  . HYDROcodone-acetaminophen (NORCO/VICODIN) 5-325 MG tablet Reorder  . HYDROcodone-acetaminophen (NORCO/VICODIN) 5-325 MG tablet Reorder    Follow-up: No Follow-up on  file.   Sherlene Shams, MD

## 2017-06-29 LAB — BASIC METABOLIC PANEL
BUN/Creatinine Ratio: 13 (ref 12–28)
BUN: 13 mg/dL (ref 8–27)
CO2: 20 mmol/L (ref 20–29)
Calcium: 9.7 mg/dL (ref 8.7–10.3)
Chloride: 103 mmol/L (ref 96–106)
Creatinine, Ser: 0.99 mg/dL (ref 0.57–1.00)
GFR calc Af Amer: 71 mL/min/{1.73_m2} (ref >59)
GFR calc non Af Amer: 62 mL/min/{1.73_m2} (ref >59)
Glucose: 88 mg/dL (ref 65–99)
Potassium: 4.7 mmol/L (ref 3.5–5.2)
Sodium: 139 mmol/L (ref 134–144)

## 2017-06-30 ENCOUNTER — Encounter: Payer: Self-pay | Admitting: Internal Medicine

## 2017-06-30 NOTE — Assessment & Plan Note (Signed)
With concurrent thalassemia. Hgb improved with periodic iv infusions by hematolyg  Lab Results  Component Value Date   WBC 9.9 06/14/2017   HGB 11.0 (L) 06/14/2017   HCT 34.5 (L) 06/14/2017   MCV 67.9 (L) 06/14/2017   PLT 270 06/14/2017

## 2017-06-30 NOTE — Assessment & Plan Note (Signed)
diagnosed with prior sleep study followed by CPAP titration study in 2014,   treatment has been deferred  by patient.  Will discuss the long term history of OSA , the risks of long term damage to heart and the signs and symptoms attributable to OSA and advise  patient to consider  use of CPAP.

## 2017-06-30 NOTE — Assessment & Plan Note (Signed)
Improved with initiation of losartan several weeks ago by nephrology.  Needs bmet repeated since initiating ARB therapy.  Done today. Cr and lytes unchanged   Lab Results  Component Value Date   CREATININE 0.99 06/28/2017   Lab Results  Component Value Date   NA 139 06/28/2017   K 4.7 06/28/2017   CL 103 06/28/2017   CO2 20 06/28/2017

## 2017-06-30 NOTE — Assessment & Plan Note (Signed)
MRI of lumbar spine done in 2018 did not show spinal stenosis,   Continue  use of vicodin once daily at bedtime only. Suspend celebrex and continue tramadol for  daytime use.   Refill history confirmed via [place] Controlled Substance databas, accessed by me today.   Refills  Dated for January,  Feb and March 2019

## 2017-07-02 ENCOUNTER — Ambulatory Visit
Admission: RE | Admit: 2017-07-02 | Discharge: 2017-07-02 | Disposition: A | Discharge status: Home / Self Care | Payer: 59 | Source: Ambulatory Visit | Attending: Internal Medicine | Admitting: Internal Medicine

## 2017-07-02 DIAGNOSIS — I700 Atherosclerosis of aorta: Secondary | ICD-10-CM | POA: Insufficient documentation

## 2017-07-02 DIAGNOSIS — J439 Emphysema, unspecified: Secondary | ICD-10-CM | POA: Insufficient documentation

## 2017-07-02 DIAGNOSIS — R918 Other nonspecific abnormal finding of lung field: Secondary | ICD-10-CM | POA: Insufficient documentation

## 2017-08-13 ENCOUNTER — Encounter: Payer: Self-pay | Admitting: Family Medicine

## 2017-08-13 ENCOUNTER — Ambulatory Visit (INDEPENDENT_AMBULATORY_CARE_PROVIDER_SITE_OTHER)
Admission: RE | Admit: 2017-08-13 | Discharge: 2017-08-13 | Disposition: A | Discharge status: Home / Self Care | Payer: 59 | Source: Ambulatory Visit | Attending: Family Medicine | Admitting: Family Medicine

## 2017-08-13 ENCOUNTER — Ambulatory Visit (INDEPENDENT_AMBULATORY_CARE_PROVIDER_SITE_OTHER): Payer: 59 | Admitting: Family Medicine

## 2017-08-13 ENCOUNTER — Ambulatory Visit: Payer: Self-pay

## 2017-08-13 VITALS — BP 144/68 | HR 96 | Ht 63.0 in | Wt 160.0 lb

## 2017-08-13 DIAGNOSIS — M542 Cervicalgia: Secondary | ICD-10-CM

## 2017-08-13 DIAGNOSIS — M7711 Lateral epicondylitis, right elbow: Secondary | ICD-10-CM

## 2017-08-13 DIAGNOSIS — M25521 Pain in right elbow: Secondary | ICD-10-CM | POA: Diagnosis not present

## 2017-08-13 DIAGNOSIS — M999 Biomechanical lesion, unspecified: Secondary | ICD-10-CM | POA: Diagnosis not present

## 2017-08-13 DIAGNOSIS — G8929 Other chronic pain: Secondary | ICD-10-CM | POA: Diagnosis not present

## 2017-08-13 HISTORY — DX: Lateral epicondylitis, right elbow: M77.11

## 2017-08-13 MED ORDER — GABAPENTIN 100 MG PO CAPS: 200.0000 mg | ORAL_CAPSULE | Freq: Every day | ORAL | 3 refills | Status: DC

## 2017-08-13 NOTE — Assessment & Plan Note (Signed)
Likely multifactorial.  X-rays pending.  Has muscle relaxers, given topical anti-inflammatories.  Discussed posture and ergonomics.  Responded well to manipulation.  Getting adjustable standing desk.  Follow-up again in 4 weeks

## 2017-08-13 NOTE — Assessment & Plan Note (Signed)
Lateral epicondylitis.  Wrist brace given, discussed avoiding excessive wrist extension.  Discussed icing regimen and home exercises.  Discussed which activity to doing which would avoid.  Increase activity slowly.  Discussed topical anti-inflammatories.  Work with Event organiserathletic trainer to learn home exercises.  Follow-up again in 4 weeks

## 2017-08-13 NOTE — Assessment & Plan Note (Signed)
Decision today to treat with OMT was based on Physical Exam  After verbal consent patient was treated with HVLA, ME, FPR techniques in cervical, thoracic, lumbar and sacral areas  Patient tolerated the procedure well with improvement in symptoms  Patient given exercises, stretches and lifestyle modifications  See medications in patient instructions if given  Patient will follow up in 4 weeks 

## 2017-08-13 NOTE — Patient Instructions (Addendum)
Good to see you  Elbow and back are what we are going to focus on.  Ice 20 minutes 2 times daily. Usually after activity and before bed. Exercises 3 times a week.  Wrist brace day and night for 1 week then nightly for 2 week.  Keep monitor at eye level We will work on getting you a standing desk  Do not lift anything overhand if you can  Gabapentin 200mg  at night Keep hands within peripheral vision  Hope the manipulation helps See me again in 3-4 week s

## 2017-08-13 NOTE — Progress Notes (Signed)
Cheryl [name] D.O. [facility] [address] [place], [place] [address] Phone: [phone] Subjective:    CC: Back and elbow pain  ZYS:[id number]:Subjective  Cheryl LaineKathryn Ann [name] is a 62 y.o. female coming in with complaint of bilateral neck pain right elbow pain. Pain in the back of the occiput that radiates to the shoulder. Loss of ROM.  Onset- Chronic Location- Medial Duration-  Character- Sore, sharp Aggravating factors- Flexion while picking things up Reliving factors-  Therapies tried- Topicals Severity-8 out of 10 and affecting daily activities.  Neck pain seems to be more positional.  Does do a lot of desk work.  States that the stool, throbbing aching pain can be very difficult.  Can stop her from some activities.  Denies any radiation down the arms.     Past Medical History:  Diagnosis Date  . 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 Arrhythmia    left bundle branch block  . Arthritis   . Blood in stool   . Chicken pox   . Generalized headaches   . History of blood transfusion   . LBBB (left bundle branch block)   . Thyroid disease   . UTI (lower urinary tract infection)    Past Surgical History:  Procedure Laterality Date  . ABDOMINAL HYSTERECTOMY  1997  . APPENDECTOMY  1981  . BREAST BIOPSY Bilateral 1976   neg  . BREAST SURGERY  1976  . CARDIAC CATHETERIZATION  2004   [facility]  . CARDIAC CATHETERIZATION  2006   [facility]  . cystic fibrosis tumor removal  1983  . THUMB AMPUTATION  1992   traumatic  . TONSILLECTOMY AND ADENOIDECTOMY  1964   Social History   Socioeconomic History  . Marital status: Married    Spouse name: None  . Number of children: None  . Years of education: None  . Highest education level: None  Social Needs  . Financial resource strain: None  . Food insecurity - worry: None  . Food insecurity -  inability: None  . Transportation needs - medical: None  . Transportation needs - non-medical: None  Occupational History  . None  Tobacco Use  . Smoking status: Former Smoker    Packs/day: 0.25    Years: 34.00    Pack years: 8.50    Types: Cigarettes    Last attempt to quit: 04/10/2013    Years since quitting: 4.3  . Smokeless tobacco: Never Used  Substance and Sexual Activity  . Alcohol use: Yes    Comment: occasional  . Drug use: No  . Sexual activity: None  Other Topics Concern  . None  Social History Narrative   Married to [employer] F[place]   Works for [employer], Engineer, structuraladministrative secretary   Allergies  Allergen Reactions  . Peanuts [Peanut Oil]   . Penicillins Other (See Comments)    Hives,rash,nausea,swelling,   . Sulfa Antibiotics Other (See Comments)    Hives,rash,nausea,swelling  . Influenza Vac Split [Flu Virus Vaccine]     Pleurisy  1996  . Mobic [Meloxicam] Nausea Only  . Nickel    Family History  Problem Relation Age of Onset  . Heart disease Mother   . Arthritis Mother   . Cancer Mother        breast  . Hyperlipidemia Mother   . Hypertension Mother   . Heart attack Mother   . Breast cancer  Mother 47  . Heart disease Father   . Cancer Father        hodgkins disease, prostate  . Diabetes Father   . Arthritis Father   . Hypertension Father   . Heart disease Sister   . Diabetes Sister   . Heart disease Brother 40       ami x 8,  4 vessel CABG   . Diabetes Brother   . Liver disease Brother   . Lung disease Brother   . Heart disease Maternal Grandmother   . Diabetes Maternal Grandmother   . Heart disease Maternal Grandfather   . Heart disease Paternal Grandmother   . Diabetes Paternal Grandmother   . Stroke Paternal Grandfather   . Heart disease Paternal Grandfather   . Diabetes Paternal Grandfather   . Diabetes Maternal Aunt      Past medical history, social, surgical and family history all reviewed in electronic medical record.  No pertanent  information unless stated regarding to the chief complaint.   Review of Systems:Review of systems updated and as accurate as of 08/13/17  No headache, visual changes, nausea, vomiting, diarrhea, constipation, dizziness, abdominal pain, skin rash, fevers, chills, night sweats, weight loss, swollen lymph nodes, body aches, joint swelling,  chest pain, shortness of breath, mood changes.  Positive muscle aches  Objective  Blood pressure (!) 144/68, pulse 96, height 5\' 3"  (1.6 m), weight 160 lb (72.6 kg), SpO2 96 %. Systems examined below as of 08/13/17   General: No apparent distress alert and oriented x3 mood and affect normal, dressed appropriately.  HEENT: Pupils equal, extraocular movements intact  Respiratory: Patient's speak in full sentences and does not appear short of breath  Cardiovascular: No lower extremity edema, non tender, no erythema  Skin: Warm dry intact with no signs of infection or rash on extremities or on axial skeleton.  Abdomen: Soft nontender  Neuro: Cranial nerves II through XII are intact, neurovascularly intact in all extremities with 2+ DTRs and 2+ pulses.  Lymph: No lymphadenopathy of posterior or anterior cervical chain or axillae bilaterally.  Gait normal with good balance and coordination.  MSK:  Non tender with full range of motion and good stability and symmetric strength and tone of shoulders,  wrist, hip, knee and ankles bilaterally.  Right elbow exam shows the patient does have full range of motion but does have pain with resisted extension of the wrist.  Seems to be tender to palpation over the lateral epicondylar region.  Good grip strength still noted. Neck exam shows the patient does have some mild loss of lordosis.  Lacks last 5 degrees of extension.  Pain with side bending bilaterally but no radicular symptoms.  Mild crepitus  Musculoskeletal ultrasound was performed and interpreted by [name] D.O.   Elbow:  Lateral epicondyle and common extensor  tendon origin visualized.  No edema, effusions, or avulsions seen.  Radial head unremarkable and located in annular ligament Medial epicondyle and common flexor tendon origin visualized.  No edema, effusions, or avulsions seen. Ulnar nerve in cubital tunnel unremarkable. Olecranon and triceps insertion visualized and unremarkable without edema, effusion, or avulsion.  No signs olecranon bursitis. Power doppler signal normal.  IMPRESSION: Lateral epicondylitis.  Osteopathic findings C2 flexed rotated and side bent left  C4 flexed rotated and side bent right  T3 extended rotated and side bent right  T9 extended rotated and side bent left L2 flexed rotated and side bent right Sacrum right on right  [id number]; 15 additional minutes  spent for Therapeutic exercises as stated in above notes.  This included exercises focusing on stretching, strengthening, with significant focus on eccentric aspects.   Long term goals include an improvement in range of motion, strength, endurance as well as avoiding reinjury. Patient's frequency would include in 1-2 times a day, 3-5 times a week for a duration of 6-12 weeks. Lateral Epicondylitis: Elbow anatomy was reviewed, and tendinopathy was explained.  Pt. given a formal rehab program. Series of concentric and eccentric exercises should be done starting with no weight, work up to 1 lb, hammer, etc.  Use counterforce strap if working or using hands.  Formal PT would be beneficial. Emphasized stretching an cross-friction massage Emphasized proper palms up lifting biomechanics to unload ECRB   Proper technique shown and discussed handout in great detail with ATC.  All questions were discussed and answered.     Impression and Recommendations:     This case required medical decision making of moderate complexity.      Note: This dictation was prepared with Dragon dictation along with smaller phrase technology. Any transcriptional errors that result from this  process are unintentional.

## 2017-08-15 ENCOUNTER — Ambulatory Visit (INDEPENDENT_AMBULATORY_CARE_PROVIDER_SITE_OTHER): Payer: 59 | Admitting: Vascular Surgery

## 2017-08-15 ENCOUNTER — Encounter (INDEPENDENT_AMBULATORY_CARE_PROVIDER_SITE_OTHER): Payer: 59

## 2017-09-06 ENCOUNTER — Other Ambulatory Visit (INDEPENDENT_AMBULATORY_CARE_PROVIDER_SITE_OTHER): Payer: Self-pay | Admitting: Vascular Surgery

## 2017-09-06 DIAGNOSIS — I728 Aneurysm of other specified arteries: Secondary | ICD-10-CM

## 2017-09-09 ENCOUNTER — Encounter (INDEPENDENT_AMBULATORY_CARE_PROVIDER_SITE_OTHER): Payer: Self-pay | Admitting: Vascular Surgery

## 2017-09-09 ENCOUNTER — Ambulatory Visit (INDEPENDENT_AMBULATORY_CARE_PROVIDER_SITE_OTHER): Payer: 59 | Admitting: Vascular Surgery

## 2017-09-09 ENCOUNTER — Ambulatory Visit (INDEPENDENT_AMBULATORY_CARE_PROVIDER_SITE_OTHER): Payer: 59

## 2017-09-09 VITALS — BP 148/75 | HR 84 | Resp 16 | Ht 63.0 in | Wt 162.0 lb

## 2017-09-09 DIAGNOSIS — I6529 Occlusion and stenosis of unspecified carotid artery: Secondary | ICD-10-CM | POA: Insufficient documentation

## 2017-09-09 DIAGNOSIS — I701 Atherosclerosis of renal artery: Secondary | ICD-10-CM

## 2017-09-09 DIAGNOSIS — I129 Hypertensive chronic kidney disease with stage 1 through stage 4 chronic kidney disease, or unspecified chronic kidney disease: Secondary | ICD-10-CM

## 2017-09-09 DIAGNOSIS — E785 Hyperlipidemia, unspecified: Secondary | ICD-10-CM

## 2017-09-09 DIAGNOSIS — I728 Aneurysm of other specified arteries: Secondary | ICD-10-CM

## 2017-09-09 DIAGNOSIS — I771 Stricture of artery: Secondary | ICD-10-CM

## 2017-09-09 DIAGNOSIS — I739 Peripheral vascular disease, unspecified: Secondary | ICD-10-CM

## 2017-09-09 DIAGNOSIS — I774 Celiac artery compression syndrome: Secondary | ICD-10-CM

## 2017-09-09 DIAGNOSIS — I6523 Occlusion and stenosis of bilateral carotid arteries: Secondary | ICD-10-CM

## 2017-09-09 NOTE — Progress Notes (Signed)
MRN : [id number]  Cheryl [name] is a 62 y.o. ([birth year]) female who presents with chief complaint of  Chief Complaint  Patient presents with  . Follow-up    39yr mesenteric  .  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  The patient denies abdominal pain or postprandial symptoms.  The patient denies weight loss as well as nausea.  The patient does not substantiate food fear, particular foods do not seem to aggravate or alleviate the symptoms.  The patient denies bloody bowel movements or diarrhea.  The patient has a history of colonoscopy which was not diagnostic.  No history of peptic ulcer disease.   No prior peripheral angiograms or vascular interventions.  The patient denies amaurosis fugax or recent TIA symptoms. There are no recent neurological changes noted. The patient denies claudication symptoms or rest pain symptoms. The patient denies history of DVT, PE or superficial thrombophlebitis. The patient denies recent episodes of angina     Current Meds  Medication Sig  . buPROPion (WELLBUTRIN SR) 100 MG 12 hr tablet TAKE 1 TABLET BY MOUTH TWO  TIMES DAILY  . Calcium Carbonate-Vit D-Min (CALCIUM 1200 PO) Take 1 tablet by mouth daily.  . celecoxib (CELEBREX) 200 MG capsule TAKE 1 CAPSULE (200 MG TOTAL) BY MOUTH 2 (TWO) TIMES DAILY.  [name] Cholecalciferol (VITAMIN D PO) Take by mouth. Pt takes 5000iu daily  . clobetasol ointment (TEMOVATE) 0.05 % Apply 1 application topically 2 (two) times daily. Until resolved  . Cyanocobalamin (VITAMIN B 12 PO) Take by mouth.  . cyclobenzaprine (FLEXERIL) 5 MG tablet 1-2 TABLETS AT BEDTIME, CAN BE INCREASED TO TWICE A DAY AS NEEDED FOR MUSCLE SPASM  . Diclofenac Sodium 1.5 % SOLN Apply to painful joint twice daily  . dicyclomine (BENTYL) 20 MG tablet Take 1 tablet (20 mg total) by mouth 3 (three) times daily before meals.    Tery Sanfilippo Calcium (STOOL SOFTENER PO) Take by mouth.  . EPINEPHrine (EPIPEN) 0.3 mg/0.3 mL DEVI Inject 0.3 mLs (0.3 mg total) into the muscle once. (Patient taking differently: Inject 0.3 mg into the muscle as needed. )  . gabapentin (NEURONTIN) 100 MG capsule Take 2 capsules (200 mg total) by mouth at bedtime.  [name] HYDROcodone-acetaminophen (NORCO/VICODIN) 5-325 MG tablet Take 1 tablet by mouth at bedtime as needed. For pain  . hydrOXYzine (ATARAX/VISTARIL) 25 MG tablet TAKE 1 TABLET (25 MG TOTAL) BY MOUTH 3 (THREE) TIMES DAILY AS NEEDED FOR ITCHING.  . Lactulose 20 GM/30ML SOLN 30 ml every 4 hours until constipation is relieved  . losartan (COZAAR) 25 MG tablet TK 1 T PO QD  . nitroGLYCERIN (NITROSTAT) 0.4 MG SL tablet Place 1 tablet (0.4 mg total) under the tongue every 5 (five) minutes as needed for chest pain.  . Omega-3 Fatty Acids (FISH OIL) 1000 MG CAPS Take 1 capsule by mouth daily.  [name] omeprazole (PRILOSEC) 40 MG capsule Take 1 capsule (40 mg total) by mouth daily.  . progesterone (PROMETRIUM) 200 MG capsule Take 200 mg by mouth daily.  . propranolol (INDERAL) 10 MG tablet Take 1 tablet (10 mg total) by mouth 3 (three) times daily.  . traMADol (ULTRAM) 50 MG tablet Take 1 tablet (50 mg total) by mouth 2 (two) times daily as needed. for pain  . vitamin C (ASCORBIC ACID) 500 MG tablet Take 500 mg by mouth daily.    Past Medical  History:  Diagnosis Date  . 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 Arrhythmia    left bundle branch block  . Arthritis   . Blood in stool   . Chicken pox   . Generalized headaches   . History of blood transfusion   . LBBB (left bundle branch block)   . Thyroid disease   . UTI (lower urinary tract infection)     Past Surgical History:  Procedure Laterality Date  . ABDOMINAL HYSTERECTOMY  1997  . APPENDECTOMY  1981  .  BREAST BIOPSY Bilateral 1976   neg  . BREAST SURGERY  1976  . CARDIAC CATHETERIZATION  2004   [facility]  . CARDIAC CATHETERIZATION  2006   [facility]  . cystic fibrosis tumor removal  1983  . THUMB AMPUTATION  1992   traumatic  . TONSILLECTOMY AND ADENOIDECTOMY  1964    Social History Social History   Tobacco Use  . Smoking status: Former Smoker    Packs/day: 0.25    Years: 34.00    Pack years: 8.50    Types: Cigarettes    Last attempt to quit: 04/10/2013    Years since quitting: 4.4  . Smokeless tobacco: Never Used  Substance Use Topics  . Alcohol use: Yes    Comment: occasional  . Drug use: No    Family History Family History  Problem Relation Age of Onset  . Heart disease Mother   . Arthritis Mother   . Cancer Mother        breast  . Hyperlipidemia Mother   . Hypertension Mother   . Heart attack Mother   . Breast cancer Mother 48  . Heart disease Father   . Cancer Father        hodgkins disease, prostate  . Diabetes Father   . Arthritis Father   . Hypertension Father   . Heart disease Sister   . Diabetes Sister   . Heart disease Brother 45       ami x 8,  4 vessel CABG   . Diabetes Brother   . Liver disease Brother   . Lung disease Brother   . Heart disease Maternal Grandmother   . Diabetes Maternal Grandmother   . Heart disease Maternal Grandfather   . Heart disease Paternal Grandmother   . Diabetes Paternal Grandmother   . Stroke Paternal Grandfather   . Heart disease Paternal Grandfather   . Diabetes Paternal Grandfather   . Diabetes Maternal Aunt     Allergies  Allergen Reactions  . Peanuts [Peanut Oil]   . Penicillins Other (See Comments)    Hives,rash,nausea,swelling,   . Sulfa Antibiotics Other (See Comments)    Hives,rash,nausea,swelling  . Influenza Vac Split [Flu Virus Vaccine]     Pleurisy  1996  . Mobic [Meloxicam] Nausea Only  . Nickel      REVIEW OF SYSTEMS (Negative unless checked)  Constitutional: [] Weight loss  [] Fever   [] Chills Cardiac: [] Chest pain   [] Chest pressure   [] Palpitations   [] Shortness of breath when laying flat   [] Shortness of breath with exertion. Vascular:  [x] Pain in legs with walking   [] Pain in legs at rest  [] History of DVT   [] Phlebitis   [] Swelling in legs   [] Varicose veins   [] Non-healing ulcers Pulmonary:   [] Uses home oxygen   [] Productive cough   [] Hemoptysis   [] Wheeze  []   COPD   [] Asthma Neurologic:  [] Dizziness   [] Seizures   [] History of stroke   [] History of TIA  [] Aphasia   [] Vissual changes   [] Weakness or numbness in arm   [] Weakness or numbness in leg Musculoskeletal:   [] Joint swelling   [x] Joint pain   [] Low back pain Hematologic:  [] Easy bruising  [] Easy bleeding   [] Hypercoagulable state   [] Anemic Gastrointestinal:  [] Diarrhea   [] Vomiting  [] Gastroesophageal reflux/heartburn   [] Difficulty swallowing. Genitourinary:  [] Chronic kidney disease   [] Difficult urination  [] Frequent urination   [] Blood in urine Skin:  [] Rashes   [] Ulcers  Psychological:  [] History of anxiety   []  History of major depression.  Physical Examination  Vitals:   09/09/17 0843  BP: (!) 148/75  Pulse: 84  Resp: 16  Weight: 162 lb (73.5 kg)  Height: 5\' 3"  (1.6 m)   Body mass index is 28.7 kg/m. Gen: WD/WN, NAD Head: [place]/AT, No temporalis wasting.  Ear/Nose/Throat: Hearing grossly intact, nares w/o erythema or drainage Eyes: PER, EOMI, sclera nonicteric.  Neck: Supple, no large masses.   Pulmonary:  Good air movement, no audible wheezing bilaterally, no use of accessory muscles.  Cardiac: RRR, no JVD Vascular: bilateral carotid bruits Vessel Right Left  Radial Palpable Palpable  Carotid Palpable Palpable  PT Not Palpable Not Palpable  DP Not Palpable Not Palpable  Gastrointestinal: Non-distended. No guarding/no peritoneal signs.  Musculoskeletal: M/S 5/5 throughout.  No deformity or atrophy.  Neurologic: CN 2-12 intact. Symmetrical.  Speech is fluent. Motor exam as listed  above. Psychiatric: Judgment intact, Mood & affect appropriate for pt's clinical situation. Dermatologic: No rashes or ulcers noted.  No changes consistent with cellulitis. Lymph : No lichenification or skin changes of chronic lymphedema.  CBC Lab Results  Component Value Date   WBC 9.9 06/14/2017   HGB 11.0 (L) 06/14/2017   HCT 34.5 (L) 06/14/2017   MCV 67.9 (L) 06/14/2017   PLT 270 06/14/2017    BMET    Component Value Date/Time   NA 139 06/28/2017 1359   K 4.7 06/28/2017 1359   CL 103 06/28/2017 1359   CO2 20 06/28/2017 1359   GLUCOSE 88 06/28/2017 1359   GLUCOSE 89 08/31/2016 1512   BUN 13 06/28/2017 1359   CREATININE 0.99 06/28/2017 1359   CREATININE 0.95 09/01/2014 1659   CREATININE 0.80 08/15/2012 1609   CALCIUM 9.7 06/28/2017 1359   CALCIUM 9.7 09/01/2014 1659   GFRNONAA 62 06/28/2017 1359   GFRNONAA >60 09/01/2014 1659   GFRAA 71 06/28/2017 1359   GFRAA >60 09/01/2014 1659   CrCl cannot be calculated (Patient's most recent lab result is older than the maximum 21 days allowed.).  COAG Lab Results  Component Value Date   INR 1.0 09/01/2014    Radiology Dg Cervical Spine Complete  Result Date: 08/13/2017 CLINICAL DATA:  Bilateral neck pain for 3 months. EXAM: CERVICAL SPINE - COMPLETE 4+ VIEW COMPARISON:  March 17 20 FINDINGS: There is no evidence of cervical spine fracture or prevertebral soft tissue swelling. There is kyphosis of the lower cervical spine. Degenerative joint changes with narrowed joint space and osteophyte formation are noted more prominently involving the C6-7 level. There is narrowing of the left c4 5, C5-6 and right C4-5 C5-6 neural foramina due to osteophyte encroachment. IMPRESSION: No acute abnormality. Degenerative joint changes of cervical spine. Electronically Signed   By: Sherian Rein M.D.   On: 08/13/2017 16:28   [place] Limited Joint Space Structures Up Right  Result  Date: 08/23/2017 Musculoskeletal ultrasound was performed and  interpreted by Terrilee FilesZach Smith D.O.  Elbow: Lateral epicondyle and common extensor tendon origin visualized.  No edema, effusions, or avulsions seen. Radial head unremarkable and located in annular ligament Medial epicondyle and common flexor tendon origin visualized.  No edema, effusions, or avulsions seen. Ulnar nerve in cubital tunnel unremarkable. Olecranon and triceps insertion visualized and unremarkable without edema, effusion, or avulsion.  No signs olecranon bursitis. Power doppler signal normal.   Lateral epicondylitis.      Assessment/Plan 1. Splenic artery aneurysm (HCC) Recommend:  The patient has evidence of a small splenic artery aneurysm that is 1.20 cm.  It is unchanged compared to last year.  She denies left upper quadrent abdominal pain and N/V.    Patient should undergo annual surveillance.    The patient will follow up with me annually.  - VAS US MESENTERIC; Future  2. Celiac artery stenosis (HCC) Recommend:  The patient has evidence of chronic asymptomatic mesenteric atherosclerosis.  The patient denies lifestyle limiting changes at this point in time.  Given the lack of symptoms no intervention is warranted at this time.   No further invasive studies, angiography or surgery at this time  The patient should continue walking and begin a more formal exercise program.   The patient should continue antiplatelet therapy and aggressive treatment of the lipid abnormalities  Patient should undergo noninvasive studies as ordered. The patient will follow up with me after the studies.   - VAS US MESENTERIC; Future  3. Renal artery stenosis (HCC) Given patient's arterial disease optimal control of the patient's hypertension is important. BP is acceptable today  The patient's vital signs and noninvasive studies support the renal artery stenosis is not significantly increased when compared to the previous study.  No invasive studies or intervention is indicated at this  time.  The patient will continue the current antihypertensive medications, no changes at this time.  The primary medical service will continue aggressive antihypertensive therapy as per the AHA guidelines  - VAS US RENAL ARTERY DUPLEX; Future  4. Bilateral carotid artery stenosis BP today was acceptable Given patient's atherosclerosis and PAD optimal control of the patient's hypertension is important.  The patient's BP and noninvasive studies support the previous intervention is patent. No further intervention is indicated at this time.  Therefore the patient  will continue the current medications, no changes at this time.  The primary medical service will continue aggressive antihypertensive therapy as per the AHA guidelines   - VAS US CAROTID; Future  5. PAD (peripheral artery disease) (HCC)  Recommend:  The patient has evidence of atherosclerosis of the lower extremities with claudication.  The patient does not voice lifestyle limiting changes at this point in time.  Noninvasive studies do not suggest clinically significant change.  No invasive studies, angiography or surgery at this time The patient should continue walking and begin a more formal exercise program.  The patient should continue antiplatelet therapy and aggressive treatment of the lipid abnormalities  No changes in the patient's medications at this time  The patient should continue wearing graduated compression socks 10-15 mmHg strength to control the mild edema.   - VAS US ABI WITH/WO TBI; Future  6. Renal hypertension Continue antihypertensive medications as already ordered, these medications have been reviewed and there are no changes at this time.   7. Dyslipidemia (high LDL; low HDL) Continue statin as ordered and reviewed, no changes at this time     Earl LitesGregory  Kealani Leckey, MD  09/09/2017 8:53 PM

## 2017-09-09 NOTE — Progress Notes (Signed)
[name] [address] [place], [place] [address] Phone: [phone] Subjective:    I'm seeing this patient by the request  of:    CC: Elbow and neck pain follow-up  BJY:NWGNFAOZHY  Cheryl [name] is a 62 y.o. female coming in with complaint of neck and elbow pain. She has had improvement with her elbow pain. She only has pain with pulling on heavier objects. Otherwise she feels she is doing much better.   Patient continues to have constant neck pain. She has not been able to get a stand up table at work but knows that her repetitive postures are contributing to her neck pain.  Found to have arthritic changes.  Patient has had tightness.  Increasing stress recently.      Past Medical History:  Diagnosis Date  . 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 Arrhythmia    left bundle branch block  . Arthritis   . Blood in stool   . Chicken pox   . Generalized headaches   . History of blood transfusion   . LBBB (left bundle branch block)   . Thyroid disease   . UTI (lower urinary tract infection)    Past Surgical History:  Procedure Laterality Date  . ABDOMINAL HYSTERECTOMY  1997  . APPENDECTOMY  1981  . BREAST BIOPSY Bilateral 1976   neg  . BREAST SURGERY  1976  . CARDIAC CATHETERIZATION  2004   [facility]  . CARDIAC CATHETERIZATION  2006   [facility]  . cystic fibrosis tumor removal  1983  . THUMB AMPUTATION  1992   traumatic  . TONSILLECTOMY AND ADENOIDECTOMY  1964   Social History   Socioeconomic History  . Marital status: Married    Spouse name: Not on file  . Number of children: Not on file  . Years of education: Not on file  . Highest education level: Not on file  Occupational History  . Not on file  Social Needs  . Financial resource strain: Not on file  . Food insecurity:    Worry: Not on file   Inability: Not on file  . Transportation needs:    Medical: Not on file    Non-medical: Not on file  Tobacco Use  . Smoking status: Former Smoker    Packs/day: 0.25    Years: 34.00    Pack years: 8.50    Types: Cigarettes    Last attempt to quit: 04/10/2013    Years since quitting: 4.4  . Smokeless tobacco: Never Used  Substance and Sexual Activity  . Alcohol use: Yes    Comment: occasional  . Drug use: No  . Sexual activity: Not on file  Lifestyle  . Physical activity:    Days per week: Not on file    Minutes per session: Not on file  . Stress: Not on file  Relationships  . Social connections:    Talks on phone: Not on file    Gets together: Not on file    Attends religious service: Not on file    Active member of club or organization: Not on file    Attends meetings of clubs or organizations: Not on file    Relationship status: Not on file  Other Topics Concern  . Not on file  Social History Narrative   Married to [employer]   Works for [employer],  Engineer, structuraladministrative secretary   Allergies  Allergen Reactions  . Peanuts [Peanut Oil]   . Penicillins Other (See Comments)    Hives,rash,nausea,swelling,   . Sulfa Antibiotics Other (See Comments)    Hives,rash,nausea,swelling  . Influenza Vac Split [Flu Virus Vaccine]     Pleurisy  1996  . Mobic [Meloxicam] Nausea Only  . Nickel    Family History  Problem Relation Age of Onset  . Heart disease Mother   . Arthritis Mother   . Cancer Mother        breast  . Hyperlipidemia Mother   . Hypertension Mother   . Heart attack Mother   . Breast cancer Mother 344  . Heart disease Father   . Cancer Father        hodgkins disease, prostate  . Diabetes Father   . Arthritis Father   . Hypertension Father   . Heart disease Sister   . Diabetes Sister   . Heart disease Brother 1159       ami x 8,  4 vessel CABG   . Diabetes Brother   . Liver disease Brother   . Lung disease Brother   . Heart disease Maternal Grandmother   .  Diabetes Maternal Grandmother   . Heart disease Maternal Grandfather   . Heart disease Paternal Grandmother   . Diabetes Paternal Grandmother   . Stroke Paternal Grandfather   . Heart disease Paternal Grandfather   . Diabetes Paternal Grandfather   . Diabetes Maternal Aunt      Past medical history, social, surgical and family history all reviewed in electronic medical record.  No pertanent information unless stated regarding to the chief complaint.   Review of Systems:Review of systems updated and as accurate as of 09/10/17  No headache, visual changes, nausea, vomiting, diarrhea, constipation, dizziness, abdominal pain, skin rash, fevers, chills, night sweats, weight loss, swollen lymph nodes, body aches, joint swelling,  chest pain, shortness of breath, mood changes.  Muscle aches  Objective  Blood pressure 134/66, pulse 87, height 5\' 3"  (1.6 m), weight 164 lb (74.4 kg), SpO2 97 %. Systems examined below as of 09/10/17   General: No apparent distress alert and oriented x3 mood and affect normal, dressed appropriately.  HEENT: Pupils equal, extraocular movements intact  Respiratory: Patient's speak in full sentences and does not appear short of breath  Cardiovascular: No lower extremity edema, non tender, no erythema  Skin: Warm dry intact with no signs of infection or rash on extremities or on axial skeleton.  Abdomen: Soft nontender  Neuro: Cranial nerves II through XII are intact, neurovascularly intact in all extremities with 2+ DTRs and 2+ pulses.  Lymph: No lymphadenopathy of posterior or anterior cervical chain or axillae bilaterally.  Gait normal with good balance and coordination.  MSK:  Non tender with full range of motion and good stability and symmetric strength and tone of shoulders, elbows, wrist, hip, knee and ankles bilaterally.  Patient is negative show some mild loss of lordosis.  Patient does have tightness in all range of motions and does have positive crepitus.   Negative Spurling's test.  Severe tightness of the trapezius bilaterally.  Good grip strength symmetric and full bilaterally of the upper extremities.  Osteopathic findings C2 flexed rotated and side bent right C4 flexed rotated and side bent left C7 flexed rotated and side bent left T3 extended rotated and side bent right inhaled third rib T9 extended rotated and side bent left L2 flexed rotated and side bent right  Sacrum right on right      Impression and Recommendations:     This case required medical decision making of moderate complexity.      Note: This dictation was prepared with Dragon dictation along with smaller phrase technology. Any transcriptional errors that result from this process are unintentional.

## 2017-09-10 ENCOUNTER — Telehealth: Payer: Self-pay | Admitting: Family Medicine

## 2017-09-10 ENCOUNTER — Encounter: Payer: Self-pay | Admitting: Family Medicine

## 2017-09-10 ENCOUNTER — Ambulatory Visit (INDEPENDENT_AMBULATORY_CARE_PROVIDER_SITE_OTHER): Payer: 59 | Admitting: Family Medicine

## 2017-09-10 VITALS — BP 134/66 | HR 87 | Ht 63.0 in | Wt 164.0 lb

## 2017-09-10 DIAGNOSIS — G8929 Other chronic pain: Secondary | ICD-10-CM

## 2017-09-10 DIAGNOSIS — M999 Biomechanical lesion, unspecified: Secondary | ICD-10-CM | POA: Diagnosis not present

## 2017-09-10 DIAGNOSIS — M545 Low back pain, unspecified: Secondary | ICD-10-CM

## 2017-09-10 MED ORDER — GABAPENTIN 300 MG PO CAPS: 300.0000 mg | ORAL_CAPSULE | Freq: Every day | ORAL | 3 refills | Status: DC

## 2017-09-10 MED ORDER — GABAPENTIN 300 MG PO CAPS: ORAL_CAPSULE | ORAL | 3 refills | Status: DC

## 2017-09-10 MED ORDER — VENLAFAXINE HCL ER 37.5 MG PO CP24: 37.5000 mg | ORAL_CAPSULE | Freq: Every day | ORAL | 1 refills | Status: DC

## 2017-09-10 NOTE — Patient Instructions (Signed)
Good to see you  Effexor 37.5mg  daily  Gabapentin 300mg  nightly now.  See me again in 4 weeks [place]

## 2017-09-10 NOTE — Assessment & Plan Note (Signed)
Poor posture and chronic neck pain is associated with the low back pain.  Patient is getting a standing desk.  We have started home exercise, icing regimen, patient will start on Effexor and increase gabapentin to 300 mg.  Patient will follow up with me again in 3-4 weeks.

## 2017-09-10 NOTE — Assessment & Plan Note (Signed)
Decision today to treat with OMT was based on Physical Exam  After verbal consent patient was treated with HVLA, ME, FPR techniques in cervical, thoracic, lumbar and sacral areas  Patient tolerated the procedure well with improvement in symptoms  Patient given exercises, stretches and lifestyle modifications  See medications in patient instructions if given  Patient will follow up in 4-6 weeks 

## 2017-09-10 NOTE — Telephone Encounter (Signed)
Copied from CRM [phone]#[id number]. Topic: Quick Communication - See Telephone Encounter >> Sep 10, 2017  3:21 PM Diana EvesHoyt, Maryann B wrote: CRM for notification. See Telephone encounter for: 09/10/17.  [facility] calling requesting clarification on the gabapentin (NEURONTIN) 300 MG capsule. The directions only say nightly. They are needing a quantity clarification. CB# [phone]

## 2017-09-10 NOTE — Telephone Encounter (Signed)
rx resent to pharmacy with correct SIG.

## 2017-09-11 MED ORDER — VENLAFAXINE HCL ER 37.5 MG PO CP24: 37.5000 mg | ORAL_CAPSULE | Freq: Every day | ORAL | 1 refills | Status: DC

## 2017-09-11 NOTE — Addendum Note (Signed)
Addended by: Edwena FeltyARSON, Dyane Broberg T on: 09/11/2017 02:37 PM   Modules accepted: Orders

## 2017-09-11 NOTE — Telephone Encounter (Signed)
Resent rx to pharmacy.

## 2017-09-11 NOTE — Telephone Encounter (Signed)
Pt called to report the pharmacy does not have the venlafaxine XR (EFFEXOR XR) 37.5 MG 24 hr capsule  Pt states she did speak to them today and asking if you can re send to  [phone] - [place], [place] - [address] [place] [address] [place]

## 2017-09-27 ENCOUNTER — Ambulatory Visit: Payer: 59 | Admitting: Internal Medicine

## 2017-10-07 ENCOUNTER — Other Ambulatory Visit: Payer: Self-pay | Admitting: Family Medicine

## 2017-10-07 NOTE — Telephone Encounter (Signed)
Refill done.  

## 2017-10-10 ENCOUNTER — Ambulatory Visit: Payer: 59 | Admitting: Family Medicine

## 2017-10-11 ENCOUNTER — Ambulatory Visit (INDEPENDENT_AMBULATORY_CARE_PROVIDER_SITE_OTHER): Payer: 59 | Admitting: Internal Medicine

## 2017-10-11 ENCOUNTER — Encounter: Payer: Self-pay | Admitting: Internal Medicine

## 2017-10-11 DIAGNOSIS — M19211 Secondary osteoarthritis, right shoulder: Secondary | ICD-10-CM

## 2017-10-11 DIAGNOSIS — M545 Low back pain, unspecified: Secondary | ICD-10-CM

## 2017-10-11 DIAGNOSIS — M19212 Secondary osteoarthritis, left shoulder: Secondary | ICD-10-CM

## 2017-10-11 DIAGNOSIS — G8929 Other chronic pain: Secondary | ICD-10-CM

## 2017-10-11 MED ORDER — HYDROCODONE-ACETAMINOPHEN 5-325 MG PO TABS: 1.0000 | ORAL_TABLET | Freq: Every evening | ORAL | 0 refills | Status: DC | PRN

## 2017-10-11 NOTE — Patient Instructions (Signed)
Your blood pressure is under good control and you are losing weight!!   To help manage viral sinus infections and rhinitis from pollen:  You should try [facility]'s Sinus rinse ;  It is a stong sinus "flush" using water and medicated salts.  Do it over the sink because it can be a bit messy

## 2017-10-11 NOTE — Progress Notes (Signed)
Subjective:  Patient ID: Cheryl [name], female    DOB: [birth year]  Age: 62 y.o. MRN: [id number]  CC: Diagnoses of Other secondary osteoarthritis of both shoulders and Chronic midline low back pain without sciatica were pertinent to this visit.  HPI Cheryl [name] presents for 3 MONTH FOLLOW UP ON CHRONIC neck BACK PAIN MANAGED WITH TRAMADOL AND HYDROCODONE  Due to hypertensive reaction to celebrex.    Her back pain is  secondary to degenerative changes and spurring without spinal stenosis. 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    She has been trying to exercise and lose weight.   Review of Systems;  Patient denies headache, fevers, malaise, unintentional weight loss, skin rash, eye pain, sinus congestion and sinus pain, sore throat, dysphagia,  hemoptysis , cough, dyspnea, wheezing, chest pain, palpitations, orthopnea, edema, abdominal pain, nausea, melena, diarrhea, constipation, flank pain, dysuria, hematuria, urinary  Frequency, nocturia, numbness, tingling, seizures,  Focal weakness, Loss of consciousness,  Tremor, insomnia, depression, anxiety, and suicidal ideation.      Objective:  BP 134/72 (BP Location: Left Arm, Patient Position: Sitting, Cuff Size: Normal)   Pulse (!) 106   Temp 97.7 F (36.5 C) (Oral)   Resp 15   Ht  (1.6 m)   Wt 160 lb 6.4 oz (72.8 kg)   SpO2 97%   BMI 28.41 kg/m   BP Readings from Last 3 Encounters:  10/11/17 134/72  09/10/17 134/66  09/09/17 (!) 148/75    Wt Readings from Last 3 Encounters:  10/11/17 160 lb 6.4 oz (72.8 kg)  09/10/17 164 lb (74.4 kg)  09/09/17 162 lb (73.5 kg)    General appearance: alert, cooperative and appears stated age Ears: normal TM's and external ear canals both ears Throat: lips, mucosa, and tongue normal; teeth and gums normal Neck: no adenopathy, no carotid bruit, supple, symmetrical, trachea midline and thyroid not  enlarged, symmetric, no tenderness/mass/nodules Back: symmetric, no curvature. ROM normal. No CVA tenderness. Lungs: clear to auscultation bilaterally Heart: regular rate and rhythm, S1, S2 normal, no murmur, click, rub or gallop Abdomen: soft, non-tender; bowel sounds normal; no masses,  no organomegaly Pulses: 2+ and symmetric Skin: Skin color, texture, turgor normal. No rashes or lesions Lymph nodes: Cervical, supraclavicular, and axillary nodes normal.  Lab Results  Component Value Date   HGBA1C 5.1 10/26/2015   HGBA1C 5.4 11/25/2013    Lab Results  Component Value Date   CREATININE 0.99 06/28/2017   CREATININE 0.98 06/06/2017   CREATININE 1.08 (H) 04/01/2017    Lab Results  Component Value Date   WBC 9.9 06/14/2017   HGB 11.0 (L) 06/14/2017   HCT 34.5 (L) 06/14/2017   PLT 270 06/14/2017   GLUCOSE 88 06/28/2017   CHOL 152 10/26/2015   TRIG 109 10/26/2015   HDL 32 (L) 10/26/2015   LDLDIRECT 104 (H) 08/15/2012   LDLCALC 98 10/26/2015   ALT 15 03/13/2017   AST 18 03/13/2017   NA 139 06/28/2017   K 4.7 06/28/2017   CL 103 06/28/2017   CREATININE 0.99 06/28/2017   BUN 13 06/28/2017   CO2 20 06/28/2017   TSH 0.915 10/26/2015   INR 1.0 09/01/2014   HGBA1C 5.1 10/26/2015    Dg Cervical Spine Complete  Result Date: 08/13/2017 CLINICAL DATA:  Bilateral neck pain for 3 months. EXAM: CERVICAL SPINE - COMPLETE 4+ VIEW COMPARISON:  March 17 20  FINDINGS: There is no evidence of cervical spine fracture or prevertebral soft tissue swelling. There is kyphosis of the lower cervical spine. Degenerative joint changes with narrowed joint space and osteophyte formation are noted more prominently involving the C6-7 level. There is narrowing of the left c4 5, C5-6 and right C4-5 C5-6 neural foramina due to osteophyte encroachment. IMPRESSION: No acute abnormality. Degenerative joint changes of cervical spine. Electronically Signed   By: Sherian Rein M.D.   On: 08/13/2017 16:28   [place]  Limited Joint Space Structures Up Right  Result Date: 08/23/2017 Musculoskeletal ultrasound was performed and interpreted by [name] D.O.  Elbow: Lateral epicondyle and common extensor tendon origin visualized.  No edema, effusions, or avulsions seen. Radial head unremarkable and located in annular ligament Medial epicondyle and common flexor tendon origin visualized.  No edema, effusions, or avulsions seen. Ulnar nerve in cubital tunnel unremarkable. Olecranon and triceps insertion visualized and unremarkable without edema, effusion, or avulsion.  No signs olecranon bursitis. Power doppler signal normal.   Lateral epicondylitis.    Assessment & Plan:   Problem List Items Addressed This Visit    Chronic lower back pain    MRI of lumbar spine done in 2018 did not show spinal stenosis,   Continue  use of vicodin once daily at bedtime only. Suspend celebrex and continue tramadol for  daytime use.   Refill history confirmed via [place] Controlled Substance databas, accessed by me today.   Refills  Dated for JApril, May and June  2019      Relevant Medications   HYDROcodone-acetaminophen (NORCO/VICODIN) 5-325 MG tablet    Other Visit Diagnoses    Other secondary osteoarthritis of both shoulders       Relevant Medications   HYDROcodone-acetaminophen (NORCO/VICODIN) 5-325 MG tablet      I have discontinued Cheryl [name] celecoxib. I am also having her maintain her Cholecalciferol (VITAMIN D PO), Calcium Carbonate-Vit D-Min (CALCIUM 1200 PO), vitamin C, Fish Oil, progesterone, Docusate Calcium (STOOL SOFTENER PO), EPINEPHrine, Cyanocobalamin (VITAMIN B 12 PO), hydrOXYzine, clobetasol ointment, Diclofenac Sodium, nitroGLYCERIN, propranolol, Lactulose, cyclobenzaprine, omeprazole, dicyclomine, buPROPion, losartan, traMADol, gabapentin, venlafaxine XR, and HYDROcodone-acetaminophen.  Meds ordered this encounter  Medications  . DISCONTD: HYDROcodone-acetaminophen (NORCO/VICODIN) 5-325 MG  tablet    Sig: Take 1 tablet by mouth at bedtime as needed. For pain    Dispense:  30 tablet    Refill:  0  . DISCONTD: HYDROcodone-acetaminophen (NORCO/VICODIN) 5-325 MG tablet    Sig: Take 1 tablet by mouth at bedtime as needed. For pain    Dispense:  30 tablet    Refill:  0    May refill on or after November 10 2017  . HYDROcodone-acetaminophen (NORCO/VICODIN) 5-325 MG tablet    Sig: Take 1 tablet by mouth at bedtime as needed. For pain    Dispense:  30 tablet    Refill:  0    May refill on or after December 10 2017    Medications Discontinued During This Encounter  Medication Reason  . venlafaxine XR (EFFEXOR-XR) 37.5 MG 24 hr capsule Duplicate  . celecoxib (CELEBREX) 200 MG capsule   . gabapentin (NEURONTIN) 100 MG capsule   . HYDROcodone-acetaminophen (NORCO/VICODIN) 5-325 MG tablet Reorder  . HYDROcodone-acetaminophen (NORCO/VICODIN) 5-325 MG tablet Reorder  . HYDROcodone-acetaminophen (NORCO/VICODIN) 5-325 MG tablet Reorder    Follow-up: Return in about 3 months (around 01/11/2018) for med refill .   Sherlene Shams, MD

## 2017-10-13 NOTE — Assessment & Plan Note (Signed)
MRI of lumbar spine done in 2018 did not show spinal stenosis,   Continue  use of vicodin once daily at bedtime only. Suspend celebrex and continue tramadol for  daytime use.   Refill history confirmed via [place] Controlled Substance databas, accessed by me today.   Refills  Dated for JApril, May and June  2019

## 2017-10-16 ENCOUNTER — Other Ambulatory Visit: Payer: Self-pay | Admitting: Internal Medicine

## 2017-10-30 NOTE — Progress Notes (Signed)
Cheryl ScaleZach Ariany [name] D.O. [facility] [address] [place], [place] [address] Phone: [phone] Subjective:      CC: Neck and back pain  BJY:NWGNFAOZHYHPI:Subjective  Cheryl LaineKathryn Ann [name] is a 62 y.o. female coming in with complaint of back pain. She said that she has good days and bad days. She said that her neck and shoulder give her the most pain.  Overall has been doing relatively well.  Has had some other chronic problems.  Has been sick recently.  Has been traveling so has not been doing the exercises as regularly.    Past Medical History:  Diagnosis Date  . 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 Arrhythmia    left bundle branch block  . Arthritis   . Blood in stool   . Chicken pox   . Generalized headaches   . History of blood transfusion   . LBBB (left bundle branch block)   . Thyroid disease   . UTI (lower urinary tract infection)    Past Surgical History:  Procedure Laterality Date  . ABDOMINAL HYSTERECTOMY  1997  . APPENDECTOMY  1981  . BREAST BIOPSY Bilateral 1976   neg  . BREAST SURGERY  1976  . CARDIAC CATHETERIZATION  2004   [facility]  . CARDIAC CATHETERIZATION  2006   [facility]  . cystic fibrosis tumor removal  1983  . THUMB AMPUTATION  1992   traumatic  . TONSILLECTOMY AND ADENOIDECTOMY  1964   Social History   Socioeconomic History  . Marital status: Married    Spouse name: Not on file  . Number of children: Not on file  . Years of education: Not on file  . Highest education level: Not on file  Occupational History  . Not on file  Social Needs  . Financial resource strain: Not on file  . Food insecurity:    Worry: Not on file    Inability: Not on file  . Transportation needs:    Medical: Not on file    Non-medical: Not on file  Tobacco Use  . Smoking status: Former Smoker    Packs/day: 0.25    Years: 34.00    Pack years:  8.50    Types: Cigarettes    Last attempt to quit: 04/10/2013    Years since quitting: 4.5  . Smokeless tobacco: Never Used  Substance and Sexual Activity  . Alcohol use: Yes    Comment: occasional  . Drug use: No  . Sexual activity: Not on file  Lifestyle  . Physical activity:    Days per week: Not on file    Minutes per session: Not on file  . Stress: Not on file  Relationships  . Social connections:    Talks on phone: Not on file    Gets together: Not on file    Attends religious service: Not on file    Active member of club or organization: Not on file    Attends meetings of clubs or organizations: Not on file    Relationship status: Not on file  Other Topics Concern  . Not on file  Social History Narrative   Married to [employer] [name]   Works for [employer], Engineer, structuraladministrative secretary   Allergies  Allergen Reactions  . Peanuts [Peanut Oil]   . Penicillins Other (See Comments)    Hives,rash,nausea,swelling,   . Sulfa Antibiotics Other (See Comments)  Hives,rash,nausea,swelling  . Influenza Vac Split [Flu Virus Vaccine]     Pleurisy  1996  . Mobic [Meloxicam] Nausea Only  . Nickel    Family History  Problem Relation Age of Onset  . Heart disease Mother   . Arthritis Mother   . Cancer Mother        breast  . Hyperlipidemia Mother   . Hypertension Mother   . Heart attack Mother   . Breast cancer Mother 19  . Heart disease Father   . Cancer Father        hodgkins disease, prostate  . Diabetes Father   . Arthritis Father   . Hypertension Father   . Heart disease Sister   . Diabetes Sister   . Heart disease Brother 63       ami x 8,  4 vessel CABG   . Diabetes Brother   . Liver disease Brother   . Lung disease Brother   . Heart disease Maternal Grandmother   . Diabetes Maternal Grandmother   . Heart disease Maternal Grandfather   . Heart disease Paternal Grandmother   . Diabetes Paternal Grandmother   . Stroke Paternal Grandfather   . Heart disease Paternal  Grandfather   . Diabetes Paternal Grandfather   . Diabetes Maternal Aunt      Past medical history, social, surgical and family history all reviewed in electronic medical record.  No pertanent information unless stated regarding to the chief complaint.   Review of Systems:Review of systems updated and as accurate as of 11/01/17  No headache, visual changes, nausea, vomiting, diarrhea, constipation, dizziness, abdominal pain, skin rash, fevers, chills, night sweats, weight loss, swollen lymph nodes, body aches, joint swelling,chest pain, shortness of breath, mood changes.  Positive muscle aches  Objective  Blood pressure 132/70, pulse (!) 103, height 5\' 3"  (1.6 m), weight 165 lb (74.8 kg), SpO2 97 %. Systems examined below as of 11/01/17   General: No apparent distress alert and oriented x3 mood and affect normal, dressed appropriately.  HEENT: Pupils equal, extraocular movements intact  Respiratory: Patient's speak in full sentences and does not appear short of breath  Cardiovascular: No lower extremity edema, non tender, no erythema  Skin: Warm dry intact with no signs of infection or rash on extremities or on axial skeleton.  Abdomen: Soft nontender  Neuro: Cranial nerves II through XII are intact, neurovascularly intact in all extremities with 2+ DTRs and 2+ pulses.  Lymph: No lymphadenopathy of posterior or anterior cervical chain or axillae bilaterally.  Gait normal with good balance and coordination.  MSK:  Non tender with full range of motion and good stability and symmetric strength and tone of shoulders, elbows, wrist, hip, knee and ankles bilaterally.  Neck: Inspection mild loss of lordosis. No palpable stepoffs. Negative Spurling's maneuver. Limited side bending bilaterally Grip strength and sensation normal in bilateral hands Strength good C4 to T1 distribution No sensory change to C4 to T1 Negative Hoffman sign bilaterally Reflexes normal Significant tightness in the  left trapezius  Osteopathic findings C7 flexed rotated and side bent left T3 extended rotated and side bent left inhaled third rib T6 extended rotated and side bent left L2 flexed rotated and side bent right Sacrum right on right     Impression and Recommendations:     This case required medical decision making of moderate complexity.      Note: This dictation was prepared with Dragon dictation along with smaller phrase technology. Any transcriptional errors that result  from this process are unintentional.

## 2017-11-01 ENCOUNTER — Encounter: Payer: Self-pay | Admitting: Family Medicine

## 2017-11-01 ENCOUNTER — Ambulatory Visit (INDEPENDENT_AMBULATORY_CARE_PROVIDER_SITE_OTHER): Payer: 59 | Admitting: Family Medicine

## 2017-11-01 VITALS — BP 132/70 | HR 103 | Ht 63.0 in | Wt 165.0 lb

## 2017-11-01 DIAGNOSIS — G8929 Other chronic pain: Secondary | ICD-10-CM | POA: Diagnosis not present

## 2017-11-01 DIAGNOSIS — M999 Biomechanical lesion, unspecified: Secondary | ICD-10-CM | POA: Diagnosis not present

## 2017-11-01 DIAGNOSIS — M542 Cervicalgia: Secondary | ICD-10-CM | POA: Diagnosis not present

## 2017-11-01 NOTE — Assessment & Plan Note (Signed)
Underlying arthritis as well as his postural imbalances.  Discussed with patient in great length.  Patient is going to try to ergonomics on a regular basis.  Discussed icing regimen.  Patient seems to have had some increasing stiffness recently.  Patient will try to be more diligent with home exercises and follow-up again in 6 to 8 weeks

## 2017-11-01 NOTE — Assessment & Plan Note (Signed)
Decision today to treat with OMT was based on Physical Exam  After verbal consent patient was treated with HVLA, ME, FPR techniques in cervical, thoracic, lumbar and sacral areas  Patient tolerated the procedure well with improvement in symptoms  Patient given exercises, stretches and lifestyle modifications  See medications in patient instructions if given  Patient will follow up in 4-6 weeks 

## 2017-11-01 NOTE — Patient Instructions (Addendum)
Good to see you  You know the drill  Stay active See me again in 6-8 weeks

## 2017-12-02 ENCOUNTER — Ambulatory Visit: Payer: Self-pay | Admitting: *Deleted

## 2017-12-02 NOTE — Telephone Encounter (Signed)
FYI.Cheryl [name].Cheryl [name].Pt is going to urgent care.

## 2017-12-02 NOTE — Telephone Encounter (Signed)
FYI...Pt is going to urgent care. 

## 2017-12-02 NOTE — Telephone Encounter (Signed)
Pt reports small (size of penny) lump on mid back, "Right on spine" since childhood. Reports since 11/27/17, gradually increasing in size, now red and "hot" to touch, size of half dollar. States 7/10 constant pain at area and surrounding area mid back; "Feels like muscular pain as well." Denies itching, fever, does not recall any injury. Care advise given per protocol. Directed to UC. States will follow disposition. Reason for Disposition . [1] Swelling is red AND [2] size > 2 inches (5.0 cm) (Exception: itchy area of skin)    Painful, "Hot" to touch.  Answer Assessment - Initial Assessment Questions 1. APPEARANCE of SWELLING: "What does it look like?" (e.g., lymph node, insect bite, mole)    Lump mid back, on spine 2. SIZE: "How large is the swelling?" (inches, cm or compare to coins)     50 cent piece 3. LOCATION: "Where is the swelling located?"     Mid back 4. ONSET: "When did the swelling start?"     11/27/17 5. PAIN: "Is it painful?" If so, ask: "How much?"     7/10, constant 6. ITCH: "Does it itch?" If so, ask: "How much?"     No 7. CAUSE: "What do you think caused the swelling?"     Unsure has had lump since childhood 8. OTHER SYMPTOMS: "Do you have any other symptoms?" (e.g., fever)     no  Protocols used: SKIN LUMP OR LOCALIZED SWELLING-A-AH

## 2017-12-03 ENCOUNTER — Other Ambulatory Visit: Payer: Self-pay | Admitting: Family Medicine

## 2017-12-11 ENCOUNTER — Other Ambulatory Visit: Payer: Self-pay | Admitting: *Deleted

## 2017-12-11 DIAGNOSIS — D508 Other iron deficiency anemias: Secondary | ICD-10-CM

## 2017-12-13 ENCOUNTER — Ambulatory Visit: Payer: 59 | Admitting: Family Medicine

## 2017-12-13 ENCOUNTER — Inpatient Hospital Stay: Payer: 59 | Attending: Internal Medicine

## 2017-12-13 DIAGNOSIS — D509 Iron deficiency anemia, unspecified: Secondary | ICD-10-CM | POA: Insufficient documentation

## 2017-12-13 DIAGNOSIS — Z87891 Personal history of nicotine dependence: Secondary | ICD-10-CM | POA: Insufficient documentation

## 2017-12-13 DIAGNOSIS — D563 Thalassemia minor: Secondary | ICD-10-CM | POA: Insufficient documentation

## 2017-12-13 DIAGNOSIS — Z79899 Other long term (current) drug therapy: Secondary | ICD-10-CM | POA: Insufficient documentation

## 2017-12-13 DIAGNOSIS — R918 Other nonspecific abnormal finding of lung field: Secondary | ICD-10-CM | POA: Diagnosis not present

## 2017-12-13 DIAGNOSIS — Z87442 Personal history of urinary calculi: Secondary | ICD-10-CM | POA: Diagnosis not present

## 2017-12-13 DIAGNOSIS — Z803 Family history of malignant neoplasm of breast: Secondary | ICD-10-CM | POA: Diagnosis not present

## 2017-12-13 DIAGNOSIS — D508 Other iron deficiency anemias: Secondary | ICD-10-CM

## 2017-12-13 DIAGNOSIS — E079 Disorder of thyroid, unspecified: Secondary | ICD-10-CM | POA: Insufficient documentation

## 2017-12-13 DIAGNOSIS — I447 Left bundle-branch block, unspecified: Secondary | ICD-10-CM | POA: Diagnosis not present

## 2017-12-13 LAB — CBC WITH DIFFERENTIAL/PLATELET
Basophils Absolute: 0.1 10*3/uL (ref 0–0.1)
Basophils Relative: 1 %
Eosinophils Absolute: 0.2 10*3/uL (ref 0–0.7)
Eosinophils Relative: 2 %
HCT: 34.2 % — ABNORMAL LOW (ref 35.0–47.0)
Hemoglobin: 10.9 g/dL — ABNORMAL LOW (ref 12.0–16.0)
Lymphocytes Relative: 31 %
Lymphs Abs: 2.7 10*3/uL (ref 1.0–3.6)
MCH: 21.8 pg — ABNORMAL LOW (ref 26.0–34.0)
MCHC: 31.8 g/dL — ABNORMAL LOW (ref 32.0–36.0)
MCV: 68.5 fL — ABNORMAL LOW (ref 80.0–100.0)
Monocytes Absolute: 0.6 10*3/uL (ref 0.2–0.9)
Monocytes Relative: 7 %
Neutro Abs: 5.2 10*3/uL (ref 1.4–6.5)
Neutrophils Relative %: 59 %
Platelets: 263 10*3/uL (ref 150–440)
RBC: 4.99 MIL/uL (ref 3.80–5.20)
RDW: 16.2 % — ABNORMAL HIGH (ref 11.5–14.5)
WBC: 8.8 10*3/uL (ref 3.6–11.0)

## 2017-12-13 LAB — FERRITIN: Ferritin: 176 ng/mL (ref 11–307)

## 2017-12-13 LAB — IRON AND TIBC
Iron: 80 ug/dL (ref 28–170)
Saturation Ratios: 27 % (ref 10.4–31.8)
TIBC: 299 ug/dL (ref 250–450)
UIBC: 219 ug/dL

## 2017-12-13 LAB — COMPREHENSIVE METABOLIC PANEL
ALT: 20 U/L (ref 0–44)
AST: 19 U/L (ref 15–41)
Albumin: 4.3 g/dL (ref 3.5–5.0)
Alkaline Phosphatase: 72 U/L (ref 38–126)
Anion gap: 9 (ref 5–15)
BUN: 11 mg/dL (ref 8–23)
CO2: 23 mmol/L (ref 22–32)
Calcium: 9 mg/dL (ref 8.9–10.3)
Chloride: 105 mmol/L (ref 98–111)
Creatinine, Ser: 1 mg/dL (ref 0.44–1.00)
GFR calc Af Amer: >60 mL/min (ref >60)
GFR calc non Af Amer: 59 mL/min — ABNORMAL LOW (ref >60)
Glucose, Bld: 95 mg/dL (ref 70–99)
Potassium: 4.3 mmol/L (ref 3.5–5.1)
Sodium: 137 mmol/L (ref 135–145)
Total Bilirubin: 0.6 mg/dL (ref 0.3–1.2)
Total Protein: 7 g/dL (ref 6.5–8.1)

## 2017-12-14 ENCOUNTER — Other Ambulatory Visit: Payer: Self-pay | Admitting: Gastroenterology

## 2017-12-20 ENCOUNTER — Other Ambulatory Visit: Payer: Self-pay

## 2017-12-20 ENCOUNTER — Encounter: Payer: Self-pay | Admitting: Internal Medicine

## 2017-12-20 ENCOUNTER — Inpatient Hospital Stay: Payer: 59

## 2017-12-20 ENCOUNTER — Inpatient Hospital Stay (HOSPITAL_BASED_OUTPATIENT_CLINIC_OR_DEPARTMENT_OTHER): Payer: 59 | Admitting: Internal Medicine

## 2017-12-20 VITALS — BP 124/75 | HR 90 | Temp 98.0°F | Resp 18 | Wt 164.2 lb

## 2017-12-20 DIAGNOSIS — I447 Left bundle-branch block, unspecified: Secondary | ICD-10-CM

## 2017-12-20 DIAGNOSIS — R918 Other nonspecific abnormal finding of lung field: Secondary | ICD-10-CM

## 2017-12-20 DIAGNOSIS — D509 Iron deficiency anemia, unspecified: Secondary | ICD-10-CM | POA: Diagnosis not present

## 2017-12-20 DIAGNOSIS — D563 Thalassemia minor: Secondary | ICD-10-CM | POA: Diagnosis not present

## 2017-12-20 DIAGNOSIS — E079 Disorder of thyroid, unspecified: Secondary | ICD-10-CM

## 2017-12-20 DIAGNOSIS — Z79899 Other long term (current) drug therapy: Secondary | ICD-10-CM

## 2017-12-20 DIAGNOSIS — Z87442 Personal history of urinary calculi: Secondary | ICD-10-CM

## 2017-12-20 DIAGNOSIS — Z87891 Personal history of nicotine dependence: Secondary | ICD-10-CM

## 2017-12-20 DIAGNOSIS — Z803 Family history of malignant neoplasm of breast: Secondary | ICD-10-CM

## 2017-12-20 NOTE — Progress Notes (Signed)
Patient here today for follow up. No concerns voiced.  °

## 2017-12-20 NOTE — Assessment & Plan Note (Signed)
#   Chronic microcytic anemia/history of beta thalassemia - out of proportion to her anemia. S/p IV venoferx4 in fall of 2017.  # Improved symptoms. Hb today 10.9;  Iron sat- 27%. Recommend continued dietary intervention.  No plans for IV iron at this time; STABLE.   # Lung nodules- incidental on scans in march 2017; March 2019- CT STABLE; benign.  # recheck labs in 12  months- 1 week prior; venofer IV possible. call with reuslts.   Cc; Dr.Tullo.  

## 2017-12-20 NOTE — Progress Notes (Signed)
[facility] CONSULT NOTE  Patient Care Team: Sherlene Shams, MD as PCP - General (Internal Medicine) Lemar Livings Merrily Pew, MD as Consulting Physician (General Surgery)  CHIEF COMPLAINTS/PURPOSE OF CONSULTATION:    HEMATOLOGY HISTORY  # CHRONIC MICROCYTIC ANEMIA- BETA THALASSEMIA MINOR [colo- 2016; Dr. Fanny Skates July 2017- IV trial of Venofer x4   # Lung nodules [CT- sub cm Jan 2017]-2019 CT scan stable.  Benign.  HISTORY OF PRESENTING ILLNESS:  Cheryl [name] 62 y.o.  female with a history of beta thalassemia minor; also iron deficiency anemia-is here for follow-up.  Patient denies any blood in stools black or stools.  Denies any nausea vomiting.  Energy levels are adequate.   ROS: A complete 10 point review of system is done which is negative except mentioned above in history of present illness  MEDICAL HISTORY:  Past Medical History:  Diagnosis Date  . 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 Arrhythmia    left bundle branch block  . Arthritis   . Blood in stool   . Chicken pox   . Generalized headaches   . History of blood transfusion   . LBBB (left bundle branch block)   . Thyroid disease   . UTI (lower urinary tract infection)     SURGICAL HISTORY: Past Surgical History:  Procedure Laterality Date  . ABDOMINAL HYSTERECTOMY  1997  . APPENDECTOMY  1981  . BREAST BIOPSY Bilateral 1976   neg  . BREAST SURGERY  1976  . CARDIAC CATHETERIZATION  2004   [facility]  . CARDIAC CATHETERIZATION  2006   [facility]  . cystic fibrosis tumor removal  1983  . THUMB AMPUTATION  1992   traumatic  . TONSILLECTOMY AND ADENOIDECTOMY  1964    SOCIAL HISTORY: Social History   Socioeconomic History  . Marital status: Married    Spouse name: Not on file  . Number of children: Not on file  . Years of education: Not on file  . Highest  education level: Not on file  Occupational History  . Not on file  Social Needs  . Financial resource strain: Not on file  . Food insecurity:    Worry: Not on file    Inability: Not on file  . Transportation needs:    Medical: Not on file    Non-medical: Not on file  Tobacco Use  . Smoking status: Former Smoker    Packs/day: 0.25    Years: 34.00    Pack years: 8.50    Types: Cigarettes    Last attempt to quit: 04/10/2013    Years since quitting: 4.6  . Smokeless tobacco: Never Used  Substance and Sexual Activity  . Alcohol use: Yes    Comment: occasional  . Drug use: No  . Sexual activity: Not on file  Lifestyle  . Physical activity:    Days per week: Not on file    Minutes per session: Not on file  . Stress: Not on file  Relationships  . Social connections:    Talks on phone: Not on file    Gets together: Not on file    Attends religious service: Not on file    Active member of club or organization: Not on file    Attends meetings of clubs or organizations: Not on file    Relationship status: Not on file  . Intimate partner violence:  Fear of current or ex partner: Not on file    Emotionally abused: Not on file    Physically abused: Not on file    Forced sexual activity: Not on file  Other Topics Concern  . Not on file  Social History Narrative   Married to [employer]   Works for [employer], Engineer, structural    FAMILY HISTORY: Family History  Problem Relation Age of Onset  . Heart disease Mother   . Arthritis Mother   . Cancer Mother        breast  . Hyperlipidemia Mother   . Hypertension Mother   . Heart attack Mother   . Breast cancer Mother 49  . Heart disease Father   . Cancer Father        hodgkins disease, prostate  . Diabetes Father   . Arthritis Father   . Hypertension Father   . Heart disease Sister   . Diabetes Sister   . Heart disease Brother 13       ami x 8,  4 vessel CABG   . Diabetes Brother   . Liver disease Brother   .  Lung disease Brother   . Heart disease Maternal Grandmother   . Diabetes Maternal Grandmother   . Heart disease Maternal Grandfather   . Heart disease Paternal Grandmother   . Diabetes Paternal Grandmother   . Stroke Paternal Grandfather   . Heart disease Paternal Grandfather   . Diabetes Paternal Grandfather   . Diabetes Maternal Aunt     ALLERGIES:  is allergic to peanuts [peanut oil]; penicillins; sulfa antibiotics; influenza vac split [flu virus vaccine]; mobic [meloxicam]; and nickel.  MEDICATIONS:  Current Outpatient Medications  Medication Sig Dispense Refill  . buPROPion (WELLBUTRIN SR) 100 MG 12 hr tablet TAKE 1 TABLET BY MOUTH TWO  TIMES DAILY 180 tablet 1  . Calcium Carbonate-Vit D-Min (CALCIUM 1200 PO) Take 1 tablet by mouth daily.    . Cholecalciferol (VITAMIN D PO) Take by mouth. Pt takes 5000iu daily    . clobetasol ointment (TEMOVATE) 0.05 % Apply 1 application topically 2 (two) times daily. Until resolved 30 g 2  . Cyanocobalamin (VITAMIN B 12 PO) Take by mouth.    . cyclobenzaprine (FLEXERIL) 5 MG tablet 1-2 TABLETS AT BEDTIME, CAN BE INCREASED TO TWICE A DAY AS NEEDED FOR MUSCLE SPASM  2  . Diclofenac Sodium 1.5 % SOLN Apply to painful joint twice daily 150 mL 3  . dicyclomine (BENTYL) 20 MG tablet Take 1 tablet (20 mg total) by mouth 3 (three) times daily before meals. 90 tablet 3  . Docusate Calcium (STOOL SOFTENER PO) Take by mouth.    . EPINEPHrine (EPIPEN) 0.3 mg/0.3 mL DEVI Inject 0.3 mLs (0.3 mg total) into the muscle once. (Patient taking differently: Inject 0.3 mg into the muscle as needed. ) 1 Device 5  . gabapentin (NEURONTIN) 100 MG capsule TAKE 2 CAPSULES(200 MG) BY MOUTH AT BEDTIME 60 capsule 0  . gabapentin (NEURONTIN) 300 MG capsule Take 1 capsule (300 mg total) by mouth at bedtime. nightly 30 capsule 3  . HYDROcodone-acetaminophen (NORCO/VICODIN) 5-325 MG tablet Take 1 tablet by mouth at bedtime as needed. For pain 30 tablet 0  . hydrOXYzine  (ATARAX/VISTARIL) 25 MG tablet TAKE 1 TABLET (25 MG TOTAL) BY MOUTH 3 (THREE) TIMES DAILY AS NEEDED FOR ITCHING. 90 tablet 3  . Lactulose 20 GM/30ML SOLN 30 ml every 4 hours until constipation is relieved 236 mL 3  . losartan (COZAAR) 25  MG tablet TK 1 T PO QD  12  . nitroGLYCERIN (NITROSTAT) 0.4 MG SL tablet Place 1 tablet (0.4 mg total) under the tongue every 5 (five) minutes as needed for chest pain. 25 tablet 3  . Omega-3 Fatty Acids (FISH OIL) 1000 MG CAPS Take 1 capsule by mouth daily.    [name]. omeprazole (PRILOSEC) 40 MG capsule Take 1 capsule (40 mg total) by mouth daily. 30 capsule 3  . progesterone (PROMETRIUM) 200 MG capsule Take 200 mg by mouth daily.    . propranolol (INDERAL) 10 MG tablet Take 1 tablet (10 mg total) by mouth 3 (three) times daily. 21 tablet 6  . traMADol (ULTRAM) 50 MG tablet Take 1 tablet (50 mg total) by mouth 2 (two) times daily as needed. for pain 60 tablet 5  . venlafaxine XR (EFFEXOR XR) 37.5 MG 24 hr capsule Take 1 capsule (37.5 mg total) by mouth daily with breakfast. 90 capsule 1  . vitamin C (ASCORBIC ACID) 500 MG tablet Take 500 mg by mouth daily.     No current facility-administered medications for this visit.       [name].  PHYSICAL EXAMINATION:   Vitals:   12/20/17 1439  BP: 124/75  Pulse: 90  Resp: 18  Temp: 98 F (36.7 C)   Filed Weights   12/20/17 1439  Weight: 164 lb 3.2 oz (74.5 kg)    GENERAL: Well-nourished well-developed; Alert, no distress and comfortable.  Alone. EYES: no pallor or icterus OROPHARYNX: no thrush or ulceration; NECK: supple; no lymph nodes felt. LYMPH:  no palpable lymphadenopathy in the axillary or inguinal regions LUNGS: Decreased breath sounds auscultation bilaterally. No wheeze or crackles HEART/CVS: regular rate & rhythm and no murmurs; No lower extremity edema ABDOMEN:abdomen soft, non-tender and normal bowel sounds. No hepatomegaly or splenomegaly.  Musculoskeletal:no cyanosis of digits and no clubbing   PSYCH: alert & oriented x 3 with fluent speech NEURO: no focal motor/sensory deficits SKIN:  no rashes or significant lesions  LABORATORY DATA:  I have reviewed the data as listed Lab Results  Component Value Date   WBC 8.8 12/13/2017   HGB 10.9 (L) 12/13/2017   HCT 34.2 (L) 12/13/2017   MCV 68.5 (L) 12/13/2017   PLT 263 12/13/2017   Recent Labs    03/13/17 1634 04/01/17 1712 06/06/17 1429 06/28/17 1359 12/13/17 1336  NA 138 139  --  139 137  K 4.4 4.0  --  4.7 4.3  CL 102 104  --  103 105  CO2 21 21  --  20 23  GLUCOSE 104* 95  --  88 95  BUN 11 10  --  13 11  CREATININE 1.11* 1.08* 0.98 0.99 1.00  CALCIUM 9.6 8.9  --  9.7 9.0  GFRNONAA 54* 56* >60 62 59*  GFRAA 62 64 >60 71 >60  PROT 6.7  --   --   --  7.0  ALBUMIN 4.5  --   --   --  4.3  AST 18  --   --   --  19  ALT 15  --   --   --  20  ALKPHOS 81  --   --   --  72  BILITOT 0.5  --   --   --  0.6     No results found.  ASSESSMENT & PLAN:   Microcytic anemia # Chronic microcytic anemia/history of beta thalassemia - out of proportion to her anemia. S/p IV venoferx4 in fall of  2017.  # Improved symptoms. Hb today 10.9;  Iron sat- 27%. Recommend continued dietary intervention.  No plans for IV iron at this time; STABLE.   # Lung nodules- incidental on scans in march 2017; March 2019- CT STABLE; benign.  # recheck labs in 12  months- 1 week prior; venofer IV possible. call with reuslts.   Cc; Dr.Tullo.    Earna Coder, MD 12/20/2017 3:01 PM

## 2017-12-27 NOTE — Progress Notes (Signed)
[name] [address] [place], [place] [address] Phone: [phone] Subjective:      CC: Left neck pain  [id number]  Cheryl [name] is a 62 y.o. female coming in with complaint of left sided neck pain. She feels like her pain is the same as last visit. She feels like the weather adds to her pain. She did have a lot of family at her house this past weekend which caused some stress.  Patient has noticed some of the pain in the neck may be worsening.  More radiation down the arm as well.  Affecting daily activities.  Never without pain.  Was responding to manipulation but feels like it no longer is helping.     Past Medical History:  Diagnosis Date  . 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 Arrhythmia    left bundle branch block  . Arthritis   . Blood in stool   . Chicken pox   . Generalized headaches   . History of blood transfusion   . LBBB (left bundle branch block)   . Thyroid disease   . UTI (lower urinary tract infection)    Past Surgical History:  Procedure Laterality Date  . ABDOMINAL HYSTERECTOMY  1997  . APPENDECTOMY  1981  . BREAST BIOPSY Bilateral 1976   neg  . BREAST SURGERY  1976  . CARDIAC CATHETERIZATION  2004   [facility]  . CARDIAC CATHETERIZATION  2006   [facility]  . cystic fibrosis tumor removal  1983  . THUMB AMPUTATION  1992   traumatic  . TONSILLECTOMY AND ADENOIDECTOMY  1964   Social History   Socioeconomic History  . Marital status: Married    Spouse name: Not on file  . Number of children: Not on file  . Years of education: Not on file  . Highest education level: Not on file  Occupational History  . Not on file  Social Needs  . Financial resource strain: Not on file  . Food insecurity:    Worry: Not on file    Inability: Not on file  . Transportation needs:    Medical:  Not on file    Non-medical: Not on file  Tobacco Use  . Smoking status: Former Smoker    Packs/day: 0.25    Years: 34.00    Pack years: 8.50    Types: Cigarettes    Last attempt to quit: 04/10/2013    Years since quitting: 4.7  . Smokeless tobacco: Never Used  Substance and Sexual Activity  . Alcohol use: Yes    Comment: occasional  . Drug use: No  . Sexual activity: Not on file  Lifestyle  . Physical activity:    Days per week: Not on file    Minutes per session: Not on file  . Stress: Not on file  Relationships  . Social connections:    Talks on phone: Not on file    Gets together: Not on file    Attends religious service: Not on file    Active member of club or organization: Not on file    Attends meetings of clubs or organizations: Not on file    Relationship status: Not on file  Other Topics Concern  . Not on file  Social History Narrative   Married to [employer]   Works for [employer], Engineer, structural   Allergies  Allergen Reactions  . Peanuts [Peanut Oil]   . Penicillins Other (See Comments)    Hives,rash,nausea,swelling,   . Sulfa Antibiotics Other (See Comments)    Hives,rash,nausea,swelling  . Influenza Vac Split [Flu Virus Vaccine]     Pleurisy  1996  . Mobic [Meloxicam] Nausea Only  . Nickel    Family History  Problem Relation Age of Onset  . Heart disease Mother   . Arthritis Mother   . Cancer Mother        breast  . Hyperlipidemia Mother   . Hypertension Mother   . Heart attack Mother   . Breast cancer Mother 42  . Heart disease Father   . Cancer Father        hodgkins disease, prostate  . Diabetes Father   . Arthritis Father   . Hypertension Father   . Heart disease Sister   . Diabetes Sister   . Heart disease Brother 2       ami x 8,  4 vessel CABG   . Diabetes Brother   . Liver disease Brother   . Lung disease Brother   . Heart disease Maternal Grandmother   . Diabetes Maternal Grandmother   . Heart disease Maternal  Grandfather   . Heart disease Paternal Grandmother   . Diabetes Paternal Grandmother   . Stroke Paternal Grandfather   . Heart disease Paternal Grandfather   . Diabetes Paternal Grandfather   . Diabetes Maternal Aunt      Past medical history, social, surgical and family history all reviewed in electronic medical record.  No pertanent information unless stated regarding to the chief complaint.   Review of Systems:Review of systems updated and as accurate as of 12/30/17  No  visual changes, nausea, vomiting, diarrhea, constipation, dizziness, abdominal pain, skin rash, fevers, chills, night sweats, weight loss, swollen lymph nodes, body aches, joint swelling, , chest pain, shortness of breath, mood changes.  Positive muscle aches, headaches  Objective  Blood pressure 132/80, pulse (!) 102, height 5\' 3"  (1.6 m), weight 163 lb (73.9 kg), SpO2 98 %. Systems examined below as of 12/30/17   General: No apparent distress alert and oriented x3 mood and affect normal, dressed appropriately.  HEENT: Pupils equal, extraocular movements intact  Respiratory: Patient's speak in full sentences and does not appear short of breath  Cardiovascular: No lower extremity edema, non tender, no erythema  Skin: Warm dry intact with no signs of infection or rash on extremities or on axial skeleton.  Abdomen: Soft nontender  Neuro: Cranial nerves II through XII are intact, neurovascularly intact in all extremities with 2+ DTRs and 2+ pulses.  Lymph: No lymphadenopathy of posterior or anterior cervical chain or axillae bilaterally.  Gait normal with good balance and coordination.  MSK:  Non tender with full range of motion and good stability and symmetric strength and tone of shoulders, elbows, wrist, hip, knee and ankles bilaterally.  Mild arthritic changes of multiple joints Neck: Inspection loss of lordosis. No palpable stepoffs. Positive Spurling's maneuver. Limited range of motion lacking last 5 to 6  degrees of rotation. Grip strength and sensation normal in bilateral hands Strength 45 in the C6 distribution. Negative Hoffman sign bilaterally Reflexes 1+ compared to the contralateral side    Impression and Recommendations:     This case required medical decision making of moderate complexity.      Note: This dictation was prepared with Dragon dictation along with smaller phrase technology. Any transcriptional errors that result from this  process are unintentional.

## 2017-12-30 ENCOUNTER — Ambulatory Visit (INDEPENDENT_AMBULATORY_CARE_PROVIDER_SITE_OTHER): Payer: 59 | Admitting: Family Medicine

## 2017-12-30 ENCOUNTER — Encounter: Payer: Self-pay | Admitting: Family Medicine

## 2017-12-30 VITALS — BP 132/80 | HR 102 | Ht 63.0 in | Wt 163.0 lb

## 2017-12-30 DIAGNOSIS — M5412 Radiculopathy, cervical region: Secondary | ICD-10-CM | POA: Diagnosis not present

## 2017-12-30 NOTE — Assessment & Plan Note (Signed)
Known arthritic changes, radicular symptoms in the left arm.  Mild weakness noted as well which is worsening.  New findings.  Deep tendon reflexes is even lower as well.  Discussed icing regimen, home exercises, which activities to do which wants to avoid.  I believe the patient would be a candidate for possible epidurals.  Follow-up again in 4 weeks after the epidural

## 2017-12-30 NOTE — Patient Instructions (Signed)
Good to see you  No change in meds Lets get MRI of the neck and see what is going on.  Ice is your friend.  After MRI I will write you and give you options including a epidural in the neck.  IF we do the epidural then I would like to see you again 3 weeks AFTER the injection

## 2018-01-02 ENCOUNTER — Other Ambulatory Visit: Payer: Self-pay | Admitting: Family Medicine

## 2018-01-02 ENCOUNTER — Other Ambulatory Visit: Payer: Self-pay

## 2018-01-06 ENCOUNTER — Other Ambulatory Visit: Payer: Self-pay | Admitting: Family Medicine

## 2018-01-12 ENCOUNTER — Other Ambulatory Visit: Payer: Self-pay | Admitting: Internal Medicine

## 2018-01-14 NOTE — Telephone Encounter (Signed)
Last filled 12/08/17 Last office visit 10/11/17 Next office visit 01/17/18

## 2018-01-15 ENCOUNTER — Ambulatory Visit
Admission: RE | Admit: 2018-01-15 | Discharge: 2018-01-15 | Disposition: A | Discharge status: Home / Self Care | Payer: 59 | Source: Ambulatory Visit | Attending: Family Medicine | Admitting: Family Medicine

## 2018-01-15 DIAGNOSIS — M5412 Radiculopathy, cervical region: Secondary | ICD-10-CM

## 2018-01-17 ENCOUNTER — Encounter: Payer: Self-pay | Admitting: Internal Medicine

## 2018-01-17 ENCOUNTER — Telehealth: Payer: Self-pay | Admitting: Internal Medicine

## 2018-01-17 ENCOUNTER — Ambulatory Visit (INDEPENDENT_AMBULATORY_CARE_PROVIDER_SITE_OTHER): Payer: 59 | Admitting: Internal Medicine

## 2018-01-17 DIAGNOSIS — M545 Low back pain, unspecified: Secondary | ICD-10-CM

## 2018-01-17 DIAGNOSIS — R635 Abnormal weight gain: Secondary | ICD-10-CM | POA: Diagnosis not present

## 2018-01-17 DIAGNOSIS — M5412 Radiculopathy, cervical region: Secondary | ICD-10-CM | POA: Diagnosis not present

## 2018-01-17 DIAGNOSIS — M19212 Secondary osteoarthritis, left shoulder: Secondary | ICD-10-CM

## 2018-01-17 DIAGNOSIS — G8929 Other chronic pain: Secondary | ICD-10-CM

## 2018-01-17 DIAGNOSIS — M19211 Secondary osteoarthritis, right shoulder: Secondary | ICD-10-CM

## 2018-01-17 MED ORDER — HYDROCODONE-ACETAMINOPHEN 5-325 MG PO TABS: 1.0000 | ORAL_TABLET | Freq: Every evening | ORAL | 0 refills | Status: DC | PRN

## 2018-01-17 MED ORDER — PREDNISONE 10 MG PO TABS: ORAL_TABLET | ORAL | 0 refills | Status: DC

## 2018-01-17 MED ORDER — TRAMADOL HCL 50 MG PO TABS: 50.0000 mg | ORAL_TABLET | Freq: Two times a day (BID) | ORAL | 5 refills | Status: DC | PRN

## 2018-01-17 NOTE — Progress Notes (Signed)
Subjective:  Patient ID: Cheryl [name], female    DOB: 1956/04/30  Age: 62 y.o. MRN: [id number]  CC: Diagnoses of Other secondary osteoarthritis of both shoulders, Cervical radiculopathy, Chronic midline low back pain without sciatica, and Weight gain were pertinent to this visit.  HPI Cheryl [name] presents for 3 mnoth follow up on chronic  Back  pain managed with Vicodin, one  daily and 2  tramadol daily  She has been having persistent Left shoulder  And neck pain aggravated by sleeping positions.  She was been under evaluation by sports medicine [name],  An MRI of cervical spine has been done and the results have been released automatically to patient without an explanation .  She has questions .   I have reviewed the report with her which notes inflammatory changes  at C2-C4  And foraminal narrowing at C4-5 on the left.  Discussed trial of prednisone taper    Obesity: weight  has fluctuated by 10 lbs for the past year.   She has gained six since last visit  Not exercising due to the heat.    HTN:    Her BP is elevated,  She attributes the elevation to [place].  She states that her   Home readings have ranged from 110 to 120  Systolic.    Outpatient Medications Prior to Visit  Medication Sig Dispense Refill  . buPROPion (WELLBUTRIN SR) 100 MG 12 hr tablet TAKE 1 TABLET BY MOUTH TWO  TIMES DAILY 180 tablet 1  . Calcium Carbonate-Vit D-Min (CALCIUM 1200 PO) Take 1 tablet by mouth daily.    . clobetasol ointment (TEMOVATE) 0.05 % Apply 1 application topically 2 (two) times daily. Until resolved 30 g 2  . Cyanocobalamin (VITAMIN B 12 PO) Take by mouth.    . cyclobenzaprine (FLEXERIL) 5 MG tablet 1-2 TABLETS AT BEDTIME, CAN BE INCREASED TO TWICE A DAY AS NEEDED FOR MUSCLE SPASM  2  . Docusate Calcium (STOOL SOFTENER PO) Take by mouth.    . EPINEPHrine (EPIPEN) 0.3 mg/0.3 mL DEVI Inject 0.3 mLs (0.3 mg total) into the  muscle once. (Patient taking differently: Inject 0.3 mg into the muscle as needed. ) 1 Device 5  . gabapentin (NEURONTIN) 100 MG capsule TAKE 2 CAPSULES(200 MG) BY MOUTH AT BEDTIME 60 capsule 0  . gabapentin (NEURONTIN) 300 MG capsule TAKE 1 CAPSULE(300 MG) BY MOUTH EVERY NIGHT AT BEDTIME 30 capsule 0  . hydrOXYzine (ATARAX/VISTARIL) 25 MG tablet TAKE 1 TABLET (25 MG TOTAL) BY MOUTH 3 (THREE) TIMES DAILY AS NEEDED FOR ITCHING. 90 tablet 3  . Lactulose 20 GM/30ML SOLN 30 ml every 4 hours until constipation is relieved 236 mL 3  . losartan (COZAAR) 25 MG tablet TK 1 T PO QD  12  . nitroGLYCERIN (NITROSTAT) 0.4 MG SL tablet Place 1 tablet (0.4 mg total) under the tongue every 5 (five) minutes as needed for chest pain. 25 tablet 3  . omeprazole (PRILOSEC) 40 MG capsule Take 1 capsule (40 mg total) by mouth daily. 30 capsule 3  . progesterone (PROMETRIUM) 200 MG capsule Take 200 mg by mouth daily.    . propranolol (INDERAL) 10 MG tablet Take 1 tablet (10 mg total) by mouth 3 (three) times daily. 21 tablet 6  . venlafaxine XR (EFFEXOR XR) 37.5 MG 24 hr capsule Take 1 capsule (37.5 mg total) by mouth daily with breakfast. 90 capsule 1  . vitamin C (ASCORBIC  ACID) 500 MG tablet Take 500 mg by mouth daily.    [name]. HYDROcodone-acetaminophen (NORCO/VICODIN) 5-325 MG tablet Take 1 tablet by mouth at bedtime as needed. For pain 30 tablet 0  . traMADol (ULTRAM) 50 MG tablet TAKE 1 TABLET BY MOUTH TWICE DAILY AS NEEDED FOR PAIN 60 tablet 5  . Diclofenac Sodium 1.5 % SOLN Apply to painful joint twice daily (Patient not taking: Reported on 01/17/2018) 150 mL 3  . dicyclomine (BENTYL) 20 MG tablet TAKE 1 TABLET BY MOUTH THREE TIMES DAILY BEFORE MEALS (Patient not taking: Reported on 01/17/2018) 90 tablet 0  . Cholecalciferol (VITAMIN D PO) Take by mouth. Pt takes 5000iu daily    . Omega-3 Fatty Acids (FISH OIL) 1000 MG CAPS Take 1 capsule by mouth daily.     No facility-administered medications prior to visit.      Review of Systems;  Patient denies headache, fevers, malaise, unintentional weight loss, skin rash, eye pain, sinus congestion and sinus pain, sore throat, dysphagia,  hemoptysis , cough, dyspnea, wheezing, chest pain, palpitations, orthopnea, edema, abdominal pain, nausea, melena, diarrhea, constipation, flank pain, dysuria, hematuria, urinary  Frequency, nocturia, numbness, tingling, seizures,  Focal weakness, Loss of consciousness,  Tremor, insomnia, depression, anxiety, and suicidal ideation.      Objective:  BP (!) 142/72 (BP Location: Left Arm, Patient Position: Sitting, Cuff Size: Normal)   Pulse 93   Temp 98.3 F (36.8 C) (Oral)   Resp 15   Ht 5\' 3"  (1.6 m)   Wt 169 lb 9.6 oz (76.9 kg)   SpO2 94%   BMI 30.04 kg/m   BP Readings from Last 3 Encounters:  01/17/18 (!) 142/72  12/30/17 132/80  12/20/17 124/75    Wt Readings from Last 3 Encounters:  01/17/18 169 lb 9.6 oz (76.9 kg)  12/30/17 163 lb (73.9 kg)  12/20/17 164 lb 3.2 oz (74.5 kg)    General appearance: alert, cooperative and appears stated age Ears: normal TM's and external ear canals both ears Throat: lips, mucosa, and tongue normal; teeth and gums normal Neck: no adenopathy, no carotid bruit, supple, symmetrical, trachea midline and thyroid not enlarged, symmetric, no tenderness/mass/nodules Back: symmetric, no curvature. ROM normal. No CVA tenderness. Lungs: clear to auscultation bilaterally Heart: regular rate and rhythm, S1, S2 normal, no murmur, click, rub or gallop Abdomen: soft, non-tender; bowel sounds normal; no masses,  no organomegaly Pulses: 2+ and symmetric Skin: Skin color, texture, turgor normal. No rashes or lesions Lymph nodes: Cervical, supraclavicular, and axillary nodes normal.  Lab Results  Component Value Date   HGBA1C 5.1 10/26/2015   HGBA1C 5.4 11/25/2013    Lab Results  Component Value Date   CREATININE 1.00 12/13/2017   CREATININE 0.99 06/28/2017   CREATININE 0.98  06/06/2017    Lab Results  Component Value Date   WBC 8.8 12/13/2017   HGB 10.9 (L) 12/13/2017   HCT 34.2 (L) 12/13/2017   PLT 263 12/13/2017   GLUCOSE 95 12/13/2017   CHOL 152 10/26/2015   TRIG 109 10/26/2015   HDL 32 (L) 10/26/2015   LDLDIRECT 104 (H) 08/15/2012   LDLCALC 98 10/26/2015   ALT 20 12/13/2017   AST 19 12/13/2017   NA 137 12/13/2017   K 4.3 12/13/2017   CL 105 12/13/2017   CREATININE 1.00 12/13/2017   BUN 11 12/13/2017   CO2 23 12/13/2017   TSH 0.915 10/26/2015   INR 1.0 09/01/2014   HGBA1C 5.1 10/26/2015    Mr Cervical Spine Wo Contrast  Result Date: 01/15/2018 CLINICAL DATA:  Cervical radiculopathy. Neck pain. Left upper extremity weakness and numbness. EXAM: MRI CERVICAL SPINE WITHOUT CONTRAST TECHNIQUE: Multiplanar, multisequence MR imaging of the cervical spine was performed. No intravenous contrast was administered. COMPARISON:  Cervical spine radiographs 08/13/2017. MRI of the brain 06/06/2017 FINDINGS: Alignment: Grade 1 anterolisthesis is present at C4-5 and C5-6. AP alignment is otherwise anatomic. There is some straightening of the normal cervical lordosis. Vertebrae: Heterogeneous marrow signal is within normal limits for age. No discrete lesions are present. Cord: Normal signal is present in the cervical and upper thoracic spinal cord to the lowest imaged level, T2-3. Posterior Fossa, vertebral arteries, paraspinal tissues: The craniocervical junction is normal. The visualized intracranial contents are normal. There is some fluid in the right sphenoid sinus. Disc levels: C1-2: Patent. C2-3: Asymmetric left-sided facet spurring is present without significant stenosis. There is some edema within the facet joints. C3-4: Asymmetric left-sided facet spurring is present. There is some edema within the facet joints as well. No significant stenosis is present. C4-5: Asymmetric left-sided facet hypertrophy is present. Mild left foraminal narrowing is present. The  central canal and right foramen are patent. C5-6: No significant focal disc protrusion or stenosis is present. C6-7: The central canal and foramina are patent. Mild uncovertebral spurring is present without stenosis. C7-T1: Negative. IMPRESSION: 1. Asymmetric left-sided facet arthropathy at C2-3 and C3-4 with some edema in both joints suggesting acute on chronic inflammatory change. 2. No associated foraminal narrowing at these levels. 3. Mild left foraminal narrowing due to uncovertebral and facet hypertrophy at C4-5. 4. Mild uncovertebral spurring at C6-7 without significant stenosis. Electronically Signed   By: Marin Roberts M.D.   On: 01/15/2018 12:37    Assessment & Plan:   Problem List Items Addressed This Visit    Cervical radiculopathy    She has persistent pain and an MRI cervical spine was done.   report reviewed with her today ,  Starting prednisone taper for inflammatory changes noted at C2-C4 level       Chronic lower back pain    MRI of lumbar spine done in 2018 did not show spinal stenosis,   But her pain was not controlled with Celebrex and it raised her blood pressure.  Continue  use of vicodin once daily at bedtime only. Suspended  celebrex and continue tramadol for  daytime use.   Refill history confirmed via [place] Controlled Substance databas, accessed by me today.   Refills  Dated for August 23, September 23 and March 19 2018      Relevant Medications   predniSONE (DELTASONE) 10 MG tablet   traMADol (ULTRAM) 50 MG tablet (Start on 02/16/2018)   HYDROcodone-acetaminophen (NORCO/VICODIN) 5-325 MG tablet   Weight gain    I have addressed  BMI and recommended wt loss of 10% of body weigh over the next 6 months using a low glycemic index diet and regular exercise a minimum of 5 days per week.         Other Visit Diagnoses    Other secondary osteoarthritis of both shoulders       Relevant Medications   predniSONE (DELTASONE) 10 MG tablet   traMADol (ULTRAM) 50 MG  tablet (Start on 02/16/2018)   HYDROcodone-acetaminophen (NORCO/VICODIN) 5-325 MG tablet      I have discontinued Samara Deist A. Maxham's Cholecalciferol (VITAMIN D PO) and Fish Oil. I have also changed her traMADol. Additionally, I am having her start on predniSONE. Lastly, I am having her maintain  her Calcium Carbonate-Vit D-Min (CALCIUM 1200 PO), vitamin C, progesterone, Docusate Calcium (STOOL SOFTENER PO), EPINEPHrine, Cyanocobalamin (VITAMIN B 12 PO), hydrOXYzine, clobetasol ointment, Diclofenac Sodium, nitroGLYCERIN, propranolol, Lactulose, cyclobenzaprine, omeprazole, losartan, venlafaxine XR, buPROPion, dicyclomine, gabapentin, gabapentin, and HYDROcodone-acetaminophen.  Meds ordered this encounter  Medications  . predniSONE (DELTASONE) 10 MG tablet    Sig: 6 tablets on Day 1 , then reduce by 1 tablet daily until gone    Dispense:  21 tablet    Refill:  0  . traMADol (ULTRAM) 50 MG tablet    Sig: Take 1 tablet (50 mg total) by mouth 2 (two) times daily as needed. for pain    Dispense:  60 tablet    Refill:  5  . DISCONTD: HYDROcodone-acetaminophen (NORCO/VICODIN) 5-325 MG tablet    Sig: Take 1 tablet by mouth at bedtime as needed. For pain    Dispense:  30 tablet    Refill:  0    May refill on or after January 17 2018  . DISCONTD: HYDROcodone-acetaminophen (NORCO/VICODIN) 5-325 MG tablet    Sig: Take 1 tablet by mouth at bedtime as needed. For pain    Dispense:  30 tablet    Refill:  0    May refill on or after /February 16 2018  . HYDROcodone-acetaminophen (NORCO/VICODIN) 5-325 MG tablet    Sig: Take 1 tablet by mouth at bedtime as needed. For pain    Dispense:  30 tablet    Refill:  0    May refill on or after March 18 2018    Medications Discontinued During This Encounter  Medication Reason  . Cholecalciferol (VITAMIN D PO)   . Omega-3 Fatty Acids (FISH OIL) 1000 MG CAPS Patient Preference  . traMADol (ULTRAM) 50 MG tablet Reorder  . HYDROcodone-acetaminophen  (NORCO/VICODIN) 5-325 MG tablet Reorder  . HYDROcodone-acetaminophen (NORCO/VICODIN) 5-325 MG tablet Reorder  . HYDROcodone-acetaminophen (NORCO/VICODIN) 5-325 MG tablet Reorder    Follow-up: Return in about 3 months (around 04/19/2018) for med refill .   Sherlene Shams, MD

## 2018-01-17 NOTE — Patient Instructions (Addendum)
I have refilled your vicodin with hard copy prescriptions for the next 3 months, and the tramadol for 6 months   (Starting in January,  ALL controlled substances must be electronically prescribed)   Your MRI of the cervical spine showed inflammatory changes and possible pinched nerve on the left .  I have prescribed a prednisone taper  To take over the next 6 days . Let Dr Katrinka BlazingSmith know if this helped at all

## 2018-01-19 NOTE — Assessment & Plan Note (Signed)
She has persistent pain and an MRI cervical spine was done.   report reviewed with her today ,  Starting prednisone taper for inflammatory changes noted at C2-C4 level

## 2018-01-19 NOTE — Assessment & Plan Note (Signed)
I have addressed  BMI and recommended wt loss of 10% of body weigh over the next 6 months using a low glycemic index diet and regular exercise a minimum of 5 days per week.   

## 2018-01-19 NOTE — Assessment & Plan Note (Signed)
MRI of lumbar spine done in 2018 did not show spinal stenosis,   But her pain was not controlled with Celebrex and it raised her blood pressure.  Continue  use of vicodin once daily at bedtime only. Suspended  celebrex and continue tramadol for  daytime use.   Refill history confirmed via [place] Controlled Substance databas, accessed by me today.   Refills  Dated for August 23, September 23 and March 19 2018

## 2018-01-20 DIAGNOSIS — I10 Essential (primary) hypertension: Secondary | ICD-10-CM | POA: Diagnosis not present

## 2018-01-20 DIAGNOSIS — N182 Chronic kidney disease, stage 2 (mild): Secondary | ICD-10-CM | POA: Diagnosis not present

## 2018-01-20 DIAGNOSIS — D649 Anemia, unspecified: Secondary | ICD-10-CM | POA: Diagnosis not present

## 2018-01-24 DIAGNOSIS — R5383 Other fatigue: Secondary | ICD-10-CM | POA: Diagnosis not present

## 2018-01-24 DIAGNOSIS — R232 Flushing: Secondary | ICD-10-CM | POA: Diagnosis not present

## 2018-01-24 DIAGNOSIS — N951 Menopausal and female climacteric states: Secondary | ICD-10-CM | POA: Diagnosis not present

## 2018-02-10 ENCOUNTER — Other Ambulatory Visit: Payer: Self-pay | Admitting: Family Medicine

## 2018-02-10 DIAGNOSIS — M5412 Radiculopathy, cervical region: Secondary | ICD-10-CM

## 2018-02-10 MED ORDER — GABAPENTIN 100 MG PO CAPS: ORAL_CAPSULE | ORAL | 1 refills | Status: DC

## 2018-02-10 MED ORDER — GABAPENTIN 300 MG PO CAPS: ORAL_CAPSULE | ORAL | 1 refills | Status: DC

## 2018-02-10 NOTE — Telephone Encounter (Signed)
Refills sent into pharmacy & discussed MRI results with pt.

## 2018-02-10 NOTE — Telephone Encounter (Signed)
Patient is calling and states she has been waiting 3 weeks for her insurance information and is now completley out of these medications. She states [facility] sent a override to [facility] to get these refilled for 30 days. But will continue to get them from [facility] after this. Patient also states she has not heard anything from her MRI. Please advise.    [id number] [place], [place] - [address] AT [facility] [address]  [address] [place] [id number]  Phone: [phone] Fax: [phone]

## 2018-02-12 ENCOUNTER — Other Ambulatory Visit: Payer: Self-pay | Admitting: *Deleted

## 2018-02-12 MED ORDER — VENLAFAXINE HCL ER 37.5 MG PO CP24: 37.5000 mg | ORAL_CAPSULE | Freq: Every day | ORAL | 1 refills | Status: DC

## 2018-02-18 ENCOUNTER — Telehealth: Payer: Self-pay

## 2018-02-18 NOTE — Telephone Encounter (Signed)
PA for Tramadol has been submitted on covermymeds.  

## 2018-02-25 ENCOUNTER — Other Ambulatory Visit: Payer: Self-pay | Admitting: Internal Medicine

## 2018-02-25 DIAGNOSIS — M19212 Secondary osteoarthritis, left shoulder: Principal | ICD-10-CM

## 2018-02-25 DIAGNOSIS — M19211 Secondary osteoarthritis, right shoulder: Secondary | ICD-10-CM

## 2018-02-25 NOTE — Telephone Encounter (Unsigned)
Copied from CRM [id number]. Topic: Quick Communication - See Telephone Encounter >> Feb 25, 2018  1:40 PM Mare Loan F wrote: Pt is calling to say the [facility] is needing a prior authorization for hydorcodone and the tramadol -once done then [facility] will be able to do a 30 day supply  [facility] a seven day supply and they need a new rx for the  Remainder of the rx once [facility] had given the authorization   Best number [phone]

## 2018-02-25 NOTE — Telephone Encounter (Signed)
Prior authorization

## 2018-02-25 NOTE — Telephone Encounter (Signed)
Rx needs review for prior authorization per patient

## 2018-02-26 ENCOUNTER — Other Ambulatory Visit: Payer: 59

## 2018-02-27 ENCOUNTER — Telehealth: Payer: Self-pay

## 2018-02-27 NOTE — Telephone Encounter (Signed)
PA for Hydrocodone has been submitted on covermymeds.  

## 2018-02-27 NOTE — Telephone Encounter (Signed)
LMTCB. Please transfer pt to our office.  

## 2018-03-03 ENCOUNTER — Ambulatory Visit
Admission: RE | Admit: 2018-03-03 | Discharge: 2018-03-03 | Disposition: A | Discharge status: Home / Self Care | Payer: BLUE CROSS/BLUE SHIELD | Source: Ambulatory Visit | Attending: Family Medicine | Admitting: Family Medicine

## 2018-03-03 DIAGNOSIS — M47812 Spondylosis without myelopathy or radiculopathy, cervical region: Secondary | ICD-10-CM | POA: Diagnosis not present

## 2018-03-03 DIAGNOSIS — M5412 Radiculopathy, cervical region: Secondary | ICD-10-CM

## 2018-03-03 MED ORDER — TRIAMCINOLONE ACETONIDE 40 MG/ML IJ SUSP (RADIOLOGY)
60.0000 mg | Freq: Once | INTRAMUSCULAR | Status: AC
  Administered 2018-03-03: 60 mg via EPIDURAL

## 2018-03-03 MED ORDER — IOPAMIDOL (ISOVUE-M 300) INJECTION 61%
1.0000 mL | Freq: Once | INTRAMUSCULAR | Status: AC
  Administered 2018-03-03: 1 mL via EPIDURAL

## 2018-03-03 NOTE — Discharge Instructions (Signed)

## 2018-03-03 NOTE — Telephone Encounter (Signed)
Patient returning call. Advised PA was initiated for herself and her husband. Attempted to transfer to office, no answer but patient would like an update. Call work phone up until 1pm and cell after that.

## 2018-03-03 NOTE — Telephone Encounter (Signed)
Prior auth?

## 2018-03-04 MED ORDER — HYDROCODONE-ACETAMINOPHEN 5-325 MG PO TABS: 1.0000 | ORAL_TABLET | Freq: Every evening | ORAL | 0 refills | Status: DC | PRN

## 2018-03-04 NOTE — Telephone Encounter (Signed)
Pt states she is returning call. She was informed by insurance that PA was not required for Hydrocodone and that the Pharmacy did explain a new RX was needed. Please send to:    [id number], [place] - [address] AT [facility] OF [address] [phone] (Phone) [phone] (Fax)

## 2018-03-04 NOTE — Telephone Encounter (Signed)
Prior authorization was submitted and was not needed. Spoke with the pharmacist this morning and she stated that they filled the hydrocodone for 7 days and now they need a new script for a 30 day supply.

## 2018-03-04 NOTE — Telephone Encounter (Signed)
Message has been sent to Dr. Darrick Huntsman so a new rx can be sent in to pharmacy.

## 2018-03-04 NOTE — Telephone Encounter (Signed)
Prior [place] information

## 2018-03-04 NOTE — Telephone Encounter (Signed)
The rx needs to go to [facility] in [place].

## 2018-03-04 NOTE — Telephone Encounter (Signed)
Where does the 30 day supply need to go? Which pharmacy?

## 2018-03-05 NOTE — Telephone Encounter (Signed)
kristina from [facility] will fax over PA form that needs clinical information.  Foye Clock does not have a direct number the PA number [phone]

## 2018-03-10 NOTE — Telephone Encounter (Signed)
PA has been taken care of pt has picked up her rx

## 2018-03-23 NOTE — Progress Notes (Deleted)
[name] [address] [place], [place] [address] Phone: [phone] Subjective:    I'm seeing this patient by the request  of:    CC:   [id number]  Cheryl [name] is a 62 y.o. female coming in with complaint of ***  Onset-  Location Duration-  Character- Aggravating factors- Reliving factors-  Therapies tried-  Severity-     Past Medical History:  Diagnosis Date  . 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 Arrhythmia    left bundle branch block  . Arthritis   . Blood in stool   . Chicken pox   . Generalized headaches   . History of blood transfusion   . LBBB (left bundle branch block)   . Thyroid disease   . UTI (lower urinary tract infection)    Past Surgical History:  Procedure Laterality Date  . ABDOMINAL HYSTERECTOMY  1997  . APPENDECTOMY  1981  . BREAST BIOPSY Bilateral 1976   neg  . BREAST SURGERY  1976  . CARDIAC CATHETERIZATION  2004   [facility]  . CARDIAC CATHETERIZATION  2006   [facility]  . cystic fibrosis tumor removal  1983  . THUMB AMPUTATION  1992   traumatic  . TONSILLECTOMY AND ADENOIDECTOMY  1964   Social History   Socioeconomic History  . Marital status: Married    Spouse name: Not on file  . Number of children: Not on file  . Years of education: Not on file  . Highest education level: Not on file  Occupational History  . Not on file  Social Needs  . Financial resource strain: Not on file  . Food insecurity:    Worry: Not on file    Inability: Not on file  . Transportation needs:    Medical: Not on file    Non-medical: Not on file  Tobacco Use  . Smoking status: Former Smoker    Packs/day: 0.25    Years: 34.00    Pack years: 8.50    Types: Cigarettes    Last attempt to quit: 04/10/2013    Years since quitting: 4.9  . Smokeless tobacco: Never Used  Substance  and Sexual Activity  . Alcohol use: Yes    Comment: occasional  . Drug use: No  . Sexual activity: Not on file  Lifestyle  . Physical activity:    Days per week: Not on file    Minutes per session: Not on file  . Stress: Not on file  Relationships  . Social connections:    Talks on phone: Not on file    Gets together: Not on file    Attends religious service: Not on file    Active member of club or organization: Not on file    Attends meetings of clubs or organizations: Not on file    Relationship status: Not on file  Other Topics Concern  . Not on file  Social History Narrative   Married to [employer]   Works for [employer], Engineer, structural   Allergies  Allergen Reactions  . Peanuts [Peanut Oil]   . Penicillins Other (See Comments)    Hives,rash,nausea,swelling,   . Sulfa Antibiotics Other (See Comments)    Hives,rash,nausea,swelling  . Influenza Vac Split [Flu Virus Vaccine]     Pleurisy  1996  . Mobic [Meloxicam] Nausea Only  . Nickel    Family  History  Problem Relation Age of Onset  . Heart disease Mother   . Arthritis Mother   . Cancer Mother        breast  . Hyperlipidemia Mother   . Hypertension Mother   . Heart attack Mother   . Breast cancer Mother 35  . Heart disease Father   . Cancer Father        hodgkins disease, prostate  . Diabetes Father   . Arthritis Father   . Hypertension Father   . Heart disease Sister   . Diabetes Sister   . Heart disease Brother 6       ami x 8,  4 vessel CABG   . Diabetes Brother   . Liver disease Brother   . Lung disease Brother   . Heart disease Maternal Grandmother   . Diabetes Maternal Grandmother   . Heart disease Maternal Grandfather   . Heart disease Paternal Grandmother   . Diabetes Paternal Grandmother   . Stroke Paternal Grandfather   . Heart disease Paternal Grandfather   . Diabetes Paternal Grandfather   . Diabetes Maternal Aunt     Current Outpatient Medications (Endocrine & Metabolic):    .  predniSONE (DELTASONE) 10 MG tablet, 6 tablets on Day 1 , then reduce by 1 tablet daily until gone .  progesterone (PROMETRIUM) 200 MG capsule, Take 200 mg by mouth daily.  Current Outpatient Medications (Cardiovascular):  [name]  EPINEPHrine (EPIPEN) 0.3 mg/0.3 mL DEVI, Inject 0.3 mLs (0.3 mg total) into the muscle once. (Patient taking differently: Inject 0.3 mg into the muscle as needed. ) .  losartan (COZAAR) 25 MG tablet, TK 1 T PO QD .  nitroGLYCERIN (NITROSTAT) 0.4 MG SL tablet, Place 1 tablet (0.4 mg total) under the tongue every 5 (five) minutes as needed for chest pain. [name]  propranolol (INDERAL) 10 MG tablet, Take 1 tablet (10 mg total) by mouth 3 (three) times daily.   Current Outpatient Medications (Analgesics):  .  HYDROcodone-acetaminophen (NORCO/VICODIN) 5-325 MG tablet, Take 1 tablet by mouth at bedtime as needed. For pain .  traMADol (ULTRAM) 50 MG tablet, Take 1 tablet (50 mg total) by mouth 2 (two) times daily as needed. for pain  Current Outpatient Medications (Hematological):  [name]  Cyanocobalamin (VITAMIN B 12 PO), Take by mouth.  Current Outpatient Medications (Other):  [name]  buPROPion (WELLBUTRIN SR) 100 MG 12 hr tablet, TAKE 1 TABLET BY MOUTH TWO  TIMES DAILY .  Calcium Carbonate-Vit D-Min (CALCIUM 1200 PO), Take 1 tablet by mouth daily. .  clobetasol ointment (TEMOVATE) 0.05 %, Apply 1 application topically 2 (two) times daily. Until resolved .  cyclobenzaprine (FLEXERIL) 5 MG tablet, 1-2 TABLETS AT BEDTIME, CAN BE INCREASED TO TWICE A DAY AS NEEDED FOR MUSCLE SPASM .  Diclofenac Sodium 1.5 % SOLN, Apply to painful joint twice daily (Patient not taking: Reported on 01/17/2018) .  dicyclomine (BENTYL) 20 MG tablet, TAKE 1 TABLET BY MOUTH THREE TIMES DAILY BEFORE MEALS (Patient not taking: Reported on 01/17/2018) .  Docusate Calcium (STOOL SOFTENER PO), Take by mouth. .  gabapentin (NEURONTIN) 100 MG capsule, TAKE 2 CAPSULES(200 MG) BY MOUTH AT BEDTIME .  gabapentin  (NEURONTIN) 100 MG capsule, TAKE 2 CAPSULES(200 MG) BY MOUTH AT BEDTIME .  gabapentin (NEURONTIN) 300 MG capsule, TAKE 1 CAPSULE(300 MG) BY MOUTH EVERY NIGHT AT BEDTIME .  gabapentin (NEURONTIN) 300 MG capsule, TAKE 1 CAPSULE(300 MG) BY MOUTH EVERY NIGHT AT BEDTIME .  hydrOXYzine (ATARAX/VISTARIL) 25 MG tablet, TAKE  1 TABLET (25 MG TOTAL) BY MOUTH 3 (THREE) TIMES DAILY AS NEEDED FOR ITCHING. .  Lactulose 20 GM/30ML SOLN, 30 ml every 4 hours until constipation is relieved .  omeprazole (PRILOSEC) 40 MG capsule, Take 1 capsule (40 mg total) by mouth daily. [name]  venlafaxine XR (EFFEXOR XR) 37.5 MG 24 hr capsule, Take 1 capsule (37.5 mg total) by mouth daily with breakfast. .  vitamin C (ASCORBIC ACID) 500 MG tablet, Take 500 mg by mouth daily.    Past medical history, social, surgical and family history all reviewed in electronic medical record.  No pertanent information unless stated regarding to the chief complaint.   Review of Systems:  No headache, visual changes, nausea, vomiting, diarrhea, constipation, dizziness, abdominal pain, skin rash, fevers, chills, night sweats, weight loss, swollen lymph nodes, body aches, joint swelling, muscle aches, chest pain, shortness of breath, mood changes.   Objective  There were no vitals taken for this visit. Systems examined below as of    General: No apparent distress alert and oriented x3 mood and affect normal, dressed appropriately.  HEENT: Pupils equal, extraocular movements intact  Respiratory: Patient's speak in full sentences and does not appear short of breath  Cardiovascular: No lower extremity edema, non tender, no erythema  Skin: Warm dry intact with no signs of infection or rash on extremities or on axial skeleton.  Abdomen: Soft nontender  Neuro: Cranial nerves II through XII are intact, neurovascularly intact in all extremities with 2+ DTRs and 2+ pulses.  Lymph: No lymphadenopathy of posterior or anterior cervical chain or axillae  bilaterally.  Gait normal with good balance and coordination.  MSK:  Non tender with full range of motion and good stability and symmetric strength and tone of shoulders, elbows, wrist, hip, knee and ankles bilaterally.     Impression and Recommendations:     This case required medical decision making of moderate complexity. The above documentation has been reviewed and is accurate and complete Judi Saa, DO       Note: This dictation was prepared with Dragon dictation along with smaller phrase technology. Any transcriptional errors that result from this process are unintentional.

## 2018-03-25 ENCOUNTER — Ambulatory Visit: Payer: BLUE CROSS/BLUE SHIELD | Admitting: Family Medicine

## 2018-04-06 NOTE — Progress Notes (Signed)
Cardiology Office Note  Date:  04/08/2018   ID:  Mechille, Varghese January 31, 1956, MRN [id number]  PCP:  Sherlene Shams, MD   Chief Complaint  Patient presents with  . other    12 month f/u c/o swelling and pain lump around neck area. Meds reviewed verbally with pt.    HPI:  Ms. Lallier is a 62 year old woman, patient of Dr. Darrick Huntsman, with  left bundle branch block,  41 year smoking history, stopped >2 years ago.  sleep apnea, has chronic fatigue, compliant with her CPAP Previous stress testing, cardiac catheterization at [facility] with reportedly no significant CAD at that time in 2003, additional episodes of palpitations and chest pain,  Prior episodes of chest pain,workup in June 2012 with stress testing/Myoview showing no ischemia  Moderate aortic atherosclerosis  Rare episodes of tachycardia, once per month. Prior chest pain symptoms felt to be atypical in nature. Strong family history of CAD who presents for follow-up of her palpitations, PAD.  Still smoking No desire to quit at this time She does appreciate aortic atherosclerosis on prior CT scans  Recent chest CT scan earlier in 2019 reviewed with her showing three-vessel coronary calcification, mild aortic arch atherosclerosis  Denies any tachycardia, has not been taking propranolol No recent lipid panel, possibly one at work Thinks it is good but does not know the details Prior LDL 98 Does not really want a cholesterol medication  No regular exercise program, weight has been trending upwards  EKG personally reviewed by myself on todays visit Shows normal sinus rhythm, left bundle branch block rate 73 bpm   ultrasound  March 2018 by Dr. Lorretta Harp  documenting greater than 70% celiac artery disease Aortic atherosclerosis  CT scan March 2017 abdomen  moderate aortic atherosclerosis  Previous shoulder discomfort and was taking meloxicam.   Stress test 11/10/2010 showing no ischemia. 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 2017), Arrhythmia, Arthritis, Blood in stool, Chicken pox, Generalized headaches, History of blood transfusion, LBBB (left bundle branch block), Thyroid disease, and UTI (lower urinary tract infection).  PSH:    Past Surgical History:  Procedure Laterality Date  . ABDOMINAL HYSTERECTOMY  1997  . APPENDECTOMY  1981  . BREAST BIOPSY Bilateral 1976   neg  . BREAST SURGERY  1976  . CARDIAC CATHETERIZATION  2004   [facility]  . CARDIAC CATHETERIZATION  2006   [facility]  . cystic fibrosis tumor removal  1983  . THUMB AMPUTATION  1992   traumatic  . TONSILLECTOMY AND ADENOIDECTOMY  1964    Current Outpatient Medications  Medication Sig Dispense Refill  . buPROPion (WELLBUTRIN SR) 100 MG 12 hr tablet TAKE 1 TABLET BY MOUTH TWO  TIMES DAILY 180 tablet 1  . Calcium Carbonate-Vit D-Min (CALCIUM 1200 PO) Take 1 tablet by mouth daily.    . clobetasol ointment (TEMOVATE) 0.05 % Apply 1 application topically 2 (two) times daily. Until resolved 30 g 2  . Cyanocobalamin (VITAMIN B 12 PO) Take by mouth.    . cyclobenzaprine (FLEXERIL) 5 MG tablet 1-2 TABLETS AT BEDTIME, CAN BE INCREASED TO TWICE A DAY AS NEEDED FOR MUSCLE SPASM  2  . Diclofenac Sodium 1.5 % SOLN Apply to painful joint twice daily 150 mL 3  . Docusate Calcium (STOOL SOFTENER PO) Take by  mouth.    . EPINEPHrine (EPIPEN) 0.3 mg/0.3 mL DEVI Inject 0.3 mLs (0.3 mg total) into the muscle once. (Patient taking differently: Inject 0.3 mg into the muscle as needed. ) 1 Device 5  . gabapentin (NEURONTIN) 100 MG capsule TAKE 2 CAPSULES(200 MG) BY MOUTH AT BEDTIME 60 capsule 0  . gabapentin (NEURONTIN) 300 MG capsule TAKE 1 CAPSULE(300 MG) BY MOUTH EVERY NIGHT AT BEDTIME 90 capsule 1  .  HYDROcodone-acetaminophen (NORCO/VICODIN) 5-325 MG tablet Take 1 tablet by mouth at bedtime as needed. For pain 30 tablet 0  . hydrOXYzine (ATARAX/VISTARIL) 25 MG tablet TAKE 1 TABLET (25 MG TOTAL) BY MOUTH 3 (THREE) TIMES DAILY AS NEEDED FOR ITCHING. 90 tablet 3  . Lactulose 20 GM/30ML SOLN 30 ml every 4 hours until constipation is relieved 236 mL 3  . losartan (COZAAR) 50 MG tablet Take 50 mg by mouth daily.    . nitroGLYCERIN (NITROSTAT) 0.4 MG SL tablet Place 1 tablet (0.4 mg total) under the tongue every 5 (five) minutes as needed for chest pain. 25 tablet 3  . predniSONE (DELTASONE) 10 MG tablet 6 tablets on Day 1 , then reduce by 1 tablet daily until gone 21 tablet 0  . progesterone (PROMETRIUM) 200 MG capsule Take 200 mg by mouth daily.    . propranolol (INDERAL) 10 MG tablet Take 1 tablet (10 mg total) by mouth 3 (three) times daily. (Patient taking differently: Take 10 mg by mouth 3 (three) times daily as needed. ) 21 tablet 6  . traMADol (ULTRAM) 50 MG tablet Take 1 tablet (50 mg total) by mouth 2 (two) times daily as needed. for pain 60 tablet 5  . venlafaxine XR (EFFEXOR XR) 37.5 MG 24 hr capsule Take 1 capsule (37.5 mg total) by mouth daily with breakfast. 90 capsule 1  . vitamin C (ASCORBIC ACID) 500 MG tablet Take 500 mg by mouth daily.     No current facility-administered medications for this visit.      Allergies:   Peanuts [peanut oil]; Penicillins; Sulfa antibiotics; Influenza vac split [flu virus vaccine]; Mobic [meloxicam]; and Nickel   Social History:  The patient  reports that she quit smoking about 4 years ago. Her smoking use included cigarettes. She has a 8.50 pack-year smoking history. She has never used smokeless tobacco. She reports that she drinks alcohol. She reports that she does not use drugs.   Family History:   family history includes Arthritis in her father and mother; Breast cancer (age of onset: 65) in her mother; Cancer in her father and mother; Diabetes in  her brother, father, maternal aunt, maternal grandmother, paternal grandfather, paternal grandmother, and sister; Heart attack in her mother; Heart disease in her father, maternal grandfather, maternal grandmother, mother, paternal grandfather, paternal grandmother, and sister; Heart disease (age of onset: 68) in her brother; Hyperlipidemia in her mother; Hypertension in her father and mother; Liver disease in her brother; Lung disease in her brother; Stroke in her paternal grandfather.    Review of Systems: Review of Systems  Constitutional: Negative.   Respiratory: Negative.   Cardiovascular: Negative.   Gastrointestinal: Negative.   Musculoskeletal: Negative.   Neurological: Negative.   Psychiatric/Behavioral: Negative.   All other systems reviewed and are negative.   PHYSICAL EXAM: VS:  BP 136/78 (BP Location: Left Arm, Patient Position: Sitting, Cuff Size: Normal)   Pulse 73   Ht 5\' 3"  (1.6 m)   Wt 165 lb 12 oz (75.2 kg)   BMI 29.36 kg/m  ,  BMI Body mass index is 29.36 kg/m. Constitutional:  oriented to person, place, and time. No distress.  HENT:  Head: Grossly normal Eyes:  no discharge. No scleral icterus.  Neck: No JVD, no carotid bruits  Cardiovascular: Regular rate and rhythm, no murmurs appreciated Pulmonary/Chest: Clear to auscultation bilaterally, no wheezes or rails Abdominal: Soft.  no distension.  no tenderness.  Musculoskeletal: Normal range of motion Neurological:  normal muscle tone. Coordination normal. No atrophy Skin: Skin warm and dry Psychiatric: normal affect, pleasant   Recent Labs: 12/13/2017: ALT 20; BUN 11; Creatinine, Ser 1.00; Hemoglobin 10.9; Platelets 263; Potassium 4.3; Sodium 137    Lipid Panel Lab Results  Component Value Date   CHOL 152 10/26/2015   HDL 32 (L) 10/26/2015   LDLCALC 98 10/26/2015   TRIG 109 10/26/2015      Wt Readings from Last 3 Encounters:  04/08/18 165 lb 12 oz (75.2 kg)  01/17/18 169 lb 9.6 oz (76.9 kg)   12/30/17 163 lb (73.9 kg)       ASSESSMENT AND PLAN:  Mixed hyperlipidemia Previously declined cholesterol medication She has coronary calcification, aortic atherosclerosis/PAD Goal LDL less than 70 Again discussed with her today, declining medication  History of tobacco abuse Stopped smoking in the past She continues to smoke Stressed importance of smoking cessation given coronary disease, family history, PAD  Sleep apnea, obstructive Compliant with her CPAP  Aortic atherosclerosis (HCC) Images from CT scan discussed with her She has coronary calcification, aortic atherosclerosis, images pulled up and discussed with her Nondiabetic but she is smoking  Celiac artery stenosis (HCC)  On CT scan,Ostium of the celiac artery and SMA does not have significant calcification and appears patent Asymptomatic  Palpitations She is not requiring any medications, denies any tachycardia  Disposition:   F/U  12 months   Total encounter time more than 25 minutes  Greater than 50% was spent in counseling and coordination of care with the patient   No orders of the defined types were placed in this encounter.    Signed, Dossie Arbour, M.D., Ph.D. 04/08/2018  [facility] [place], [place] [phone]

## 2018-04-07 ENCOUNTER — Other Ambulatory Visit: Payer: Self-pay | Admitting: Internal Medicine

## 2018-04-07 ENCOUNTER — Telehealth: Payer: Self-pay | Admitting: Internal Medicine

## 2018-04-07 NOTE — Telephone Encounter (Signed)
Copied from CRM [id number]. Topic: Quick Communication - Rx Refill/Question >> Apr 07, 2018  8:46 AM Angela Nevin wrote: Medication: HYDROcodone-acetaminophen (NORCO/VICODIN) 5-325 MG tablet and traMADol (ULTRAM) 50 MG tablet   Patient states that both of these medications need prior authorization. Patient states paperwork has been faxed to office. Patient is requesting a call back from CMA Shanda Bumps to discuss further. Please advise.  Cb# [phone]

## 2018-04-07 NOTE — Telephone Encounter (Signed)
Pt called again about his med.  Pt states she has been out for 5 day for both med

## 2018-04-07 NOTE — Telephone Encounter (Signed)
Please advise 

## 2018-04-08 ENCOUNTER — Telehealth: Payer: Self-pay

## 2018-04-08 ENCOUNTER — Encounter: Payer: Self-pay | Admitting: Cardiovascular Disease

## 2018-04-08 ENCOUNTER — Ambulatory Visit: Payer: BLUE CROSS/BLUE SHIELD | Admitting: Cardiovascular Disease

## 2018-04-08 VITALS — BP 136/78 | HR 73 | Ht 63.0 in | Wt 165.8 lb

## 2018-04-08 DIAGNOSIS — E782 Mixed hyperlipidemia: Secondary | ICD-10-CM | POA: Diagnosis not present

## 2018-04-08 DIAGNOSIS — I774 Celiac artery compression syndrome: Secondary | ICD-10-CM | POA: Diagnosis not present

## 2018-04-08 DIAGNOSIS — I700 Atherosclerosis of aorta: Secondary | ICD-10-CM

## 2018-04-08 DIAGNOSIS — R002 Palpitations: Secondary | ICD-10-CM | POA: Diagnosis not present

## 2018-04-08 DIAGNOSIS — G4733 Obstructive sleep apnea (adult) (pediatric): Secondary | ICD-10-CM | POA: Diagnosis not present

## 2018-04-08 DIAGNOSIS — I739 Peripheral vascular disease, unspecified: Secondary | ICD-10-CM

## 2018-04-08 DIAGNOSIS — Z87891 Personal history of nicotine dependence: Secondary | ICD-10-CM | POA: Diagnosis not present

## 2018-04-08 DIAGNOSIS — I771 Stricture of artery: Secondary | ICD-10-CM

## 2018-04-08 NOTE — Telephone Encounter (Signed)
Spoke with pt to let her know that the prior authorization has been completed on both the tramadol and hydrocodone.

## 2018-04-08 NOTE — Telephone Encounter (Signed)
PA for both Hydrocodone and Tramadol have been submitted on covermymeds.

## 2018-04-08 NOTE — Telephone Encounter (Signed)
Spoke with pt to let her know that the prior authorization has been submitted on both the tramadol and the hydrocodone.

## 2018-04-08 NOTE — Telephone Encounter (Signed)
Copied from CRM [id number]. Topic: Quick Communication - Rx Refill/Question >> Apr 07, 2018  8:46 AM Angela NevinWilliams, Candice N wrote: Medication: HYDROcodone-acetaminophen (NORCO/VICODIN) 5-325 MG tablet and traMADol (ULTRAM) 50 MG tablet   Patient states that both of these medications need prior authorization. Patient states paperwork has been faxed to office. Patient is requesting a call back from CMA Shanda BumpsJessica to discuss further. Please advise.  Cb# [phone] >> Apr 08, 2018 10:32 AM Herby AbrahamJohnson, Shiquita C wrote: Pt called back in to speak with Shanda BumpsJessica.   CB: [phone]

## 2018-04-08 NOTE — Patient Instructions (Addendum)
Goal LDL <70 2017: LDL 98   Medication Instructions:  No changes  If you need a refill on your cardiac medications before your next appointment, please call your pharmacy.    Lab work: No new labs needed   If you have labs (blood work) drawn today and your tests are completely normal, you will receive your results only by: [name]. MyChart Message (if you have MyChart) OR . A paper copy in the mail If you have any lab test that is abnormal or we need to change your treatment, we will call you to review the results.   Testing/Procedures: No new testing needed   Follow-Up: At [facility], you and your health needs are our priority.  As part of our continuing mission to provide you with exceptional heart care, we have created designated Provider Care Teams.  These Care Teams include your primary Cardiologist (physician) and Advanced Practice Providers (APPs -  Physician Assistants and Nurse Practitioners) who all work together to provide you with the care you need, when you need it.  . You will need a follow up appointment in 12 months .   Please call our office 2 months in advance to schedule this appointment.    . Providers on your designated Care Team:   . Nicolasa Duckinghristopher Berge, NP . Eula Listenyan Dunn, PA-C . Marisue IvanJacquelyn Visser, PA-C  Any Other Special Instructions Will Be Listed Below (If Applicable).  For educational health videos Log in to : [email or web address] Or : [email or web address], password : [email or web address]

## 2018-04-10 NOTE — Addendum Note (Signed)
Addended by: Kendrick FriesLOPEZ, Eiden Bagot C on: 04/10/2018 12:53 PM   Modules accepted: Orders

## 2018-04-14 NOTE — Telephone Encounter (Signed)
Spoke with [facility] and they stated that the medications would go through. They ran a test claim and it went through. Called the pharmacy to let them know that they ran the hydrocodone and stated that it went through and that they owed her some money. For the tramadol they are going to have to make a few more phone calls to see if it will go through because the rx was bought with a discount card.   Please transfer pt to our office.

## 2018-04-14 NOTE — Telephone Encounter (Signed)
Patient calling back stating that [facility] states that they have not received prior auth for hydrocodone and tramadol and that they need diagnoses.   [facility] prior auth department: [phone]

## 2018-04-15 NOTE — Telephone Encounter (Signed)
Patient returned call

## 2018-04-15 NOTE — Telephone Encounter (Signed)
LMTCB. Please transfer pt to our office.  

## 2018-04-15 NOTE — Telephone Encounter (Signed)
Pt returned call.   Please call pt back on her cell.

## 2018-04-16 NOTE — Telephone Encounter (Signed)
Spoke with pt to let her know that we finally were able to get everything figured out with her medication and that I contacted the pharmacy and they stated they owe her some money. Pt gave a verbal understanding.

## 2018-04-30 DIAGNOSIS — R232 Flushing: Secondary | ICD-10-CM | POA: Diagnosis not present

## 2018-04-30 DIAGNOSIS — N951 Menopausal and female climacteric states: Secondary | ICD-10-CM | POA: Diagnosis not present

## 2018-04-30 DIAGNOSIS — R5383 Other fatigue: Secondary | ICD-10-CM | POA: Diagnosis not present

## 2018-05-02 ENCOUNTER — Encounter: Payer: Self-pay | Admitting: Internal Medicine

## 2018-05-02 ENCOUNTER — Ambulatory Visit (INDEPENDENT_AMBULATORY_CARE_PROVIDER_SITE_OTHER): Payer: BLUE CROSS/BLUE SHIELD | Admitting: Internal Medicine

## 2018-05-02 VITALS — BP 134/66 | HR 95 | Temp 98.1°F | Resp 14 | Ht 63.0 in | Wt 169.0 lb

## 2018-05-02 DIAGNOSIS — M19211 Secondary osteoarthritis, right shoulder: Secondary | ICD-10-CM | POA: Diagnosis not present

## 2018-05-02 DIAGNOSIS — M19212 Secondary osteoarthritis, left shoulder: Secondary | ICD-10-CM

## 2018-05-02 DIAGNOSIS — G8929 Other chronic pain: Secondary | ICD-10-CM

## 2018-05-02 DIAGNOSIS — M542 Cervicalgia: Secondary | ICD-10-CM

## 2018-05-02 DIAGNOSIS — E785 Hyperlipidemia, unspecified: Secondary | ICD-10-CM

## 2018-05-02 DIAGNOSIS — N182 Chronic kidney disease, stage 2 (mild): Secondary | ICD-10-CM | POA: Diagnosis not present

## 2018-05-02 DIAGNOSIS — F419 Anxiety disorder, unspecified: Secondary | ICD-10-CM

## 2018-05-02 DIAGNOSIS — N951 Menopausal and female climacteric states: Secondary | ICD-10-CM | POA: Diagnosis not present

## 2018-05-02 DIAGNOSIS — R232 Flushing: Secondary | ICD-10-CM | POA: Diagnosis not present

## 2018-05-02 DIAGNOSIS — R5383 Other fatigue: Secondary | ICD-10-CM | POA: Diagnosis not present

## 2018-05-02 DIAGNOSIS — M2550 Pain in unspecified joint: Secondary | ICD-10-CM | POA: Diagnosis not present

## 2018-05-02 MED ORDER — ROSUVASTATIN CALCIUM 10 MG PO TABS: 10.0000 mg | ORAL_TABLET | Freq: Every day | ORAL | 11 refills | Status: DC

## 2018-05-02 MED ORDER — VENLAFAXINE HCL ER 75 MG PO CP24: 75.0000 mg | ORAL_CAPSULE | Freq: Every day | ORAL | 1 refills | Status: DC

## 2018-05-02 MED ORDER — HYDROCODONE-ACETAMINOPHEN 5-325 MG PO TABS: 1.0000 | ORAL_TABLET | Freq: Every evening | ORAL | 0 refills | Status: DC | PRN

## 2018-05-02 NOTE — Patient Instructions (Signed)
I am increasing your dose of Effexor to 75 mg daily for your stress/anxiety   I recommend starting Crestor to minimize your risk of hearta attack and stroke.  Even if you can't tolerate a daily dose   Every other day dosing will still ower your cholesterol    Please get fasting labs done in  6 weeks

## 2018-05-02 NOTE — Progress Notes (Signed)
Subjective:  Patient ID: Cheryl [name], female    DOB: July 30, 1955  Age: 62 y.o. MRN: [id number]  CC: The primary encounter diagnosis was Dyslipidemia (high LDL; low HDL). Diagnoses of CKD (chronic kidney disease) stage 2, GFR 60-89 ml/min, Other secondary osteoarthritis of both shoulders, Chronic neck pain, and Anxiety were also pertinent to this visit.  HPI Cheryl [name] presents for med refill .  She has been using vicodin and tramadol for management of chronic pain  secondary to cervical radiculopathy ,  DJD involving both shouldlers   She was given a prednisone  taper in August for inflammation seen on cervical MRI.   The prednsione helped,  She had an epidural in October done vy  radiology  in [place] . The procedure provided incomplete relief of neck pain .  Emtional Stress makes it worse  She has diffuse PAD , ongoing tobacco abuse  And declines statin therapy repeatedly because she has "heard back things about statins."  The risks and benefits of continued tobacco abuse vs use of statins were discussed today.   She is willing to try crestor .  Discussed increasing dose of effexor from 37.5 mg to 75mg  daily for anxiety .      Outpatient Medications Prior to Visit  Medication Sig Dispense Refill  . buPROPion (WELLBUTRIN SR) 100 MG 12 hr tablet TAKE 1 TABLET BY MOUTH TWO  TIMES DAILY 180 tablet 1  . Calcium Carbonate-Vit D-Min (CALCIUM 1200 PO) Take 1 tablet by mouth daily.    . clobetasol ointment (TEMOVATE) 0.05 % Apply 1 application topically 2 (two) times daily. Until resolved 30 g 2  . Cyanocobalamin (VITAMIN B 12 PO) Take by mouth.    . cyclobenzaprine (FLEXERIL) 5 MG tablet 1-2 TABLETS AT BEDTIME, CAN BE INCREASED TO TWICE A DAY AS NEEDED FOR MUSCLE SPASM  2  . Diclofenac Sodium 1.5 % SOLN Apply to painful joint twice daily 150 mL 3  . Docusate Calcium (STOOL SOFTENER PO) Take by mouth.    . EPINEPHrine (EPIPEN) 0.3 mg/0.3 mL DEVI Inject 0.3 mLs (0.3 mg total) into the  muscle once. (Patient taking differently: Inject 0.3 mg into the muscle as needed. ) 1 Device 5  . gabapentin (NEURONTIN) 100 MG capsule TAKE 2 CAPSULES(200 MG) BY MOUTH AT BEDTIME 60 capsule 0  . gabapentin (NEURONTIN) 300 MG capsule TAKE 1 CAPSULE(300 MG) BY MOUTH EVERY NIGHT AT BEDTIME 90 capsule 1  . hydrOXYzine (ATARAX/VISTARIL) 25 MG tablet TAKE 1 TABLET (25 MG TOTAL) BY MOUTH 3 (THREE) TIMES DAILY AS NEEDED FOR ITCHING. 90 tablet 3  . Lactulose 20 GM/30ML SOLN 30 ml every 4 hours until constipation is relieved 236 mL 3  . losartan (COZAAR) 50 MG tablet Take 50 mg by mouth daily.    . nitroGLYCERIN (NITROSTAT) 0.4 MG SL tablet Place 1 tablet (0.4 mg total) under the tongue every 5 (five) minutes as needed for chest pain. 25 tablet 3  . progesterone (PROMETRIUM) 200 MG capsule Take 200 mg by mouth daily.    . propranolol (INDERAL) 10 MG tablet Take 1 tablet (10 mg total) by mouth 3 (three) times daily. (Patient taking differently: Take 10 mg by mouth 3 (three) times daily as needed. ) 21 tablet 6  . traMADol (ULTRAM) 50 MG tablet Take 1 tablet (50 mg total) by mouth 2 (two) times daily as needed. for pain 60 tablet 5  . vitamin C (ASCORBIC ACID) 500 MG tablet Take 500 mg by mouth daily.    [name]  HYDROcodone-acetaminophen (NORCO/VICODIN) 5-325 MG tablet Take 1 tablet by mouth at bedtime as needed. For pain 30 tablet 0  . venlafaxine XR (EFFEXOR XR) 37.5 MG 24 hr capsule Take 1 capsule (37.5 mg total) by mouth daily with breakfast. 90 capsule 1  . predniSONE (DELTASONE) 10 MG tablet 6 tablets on Day 1 , then reduce by 1 tablet daily until gone (Patient not taking: Reported on 05/02/2018) 21 tablet 0   No facility-administered medications prior to visit.     Review of Systems;  Patient denies headache, fevers, malaise, unintentional weight loss, skin rash, eye pain, sinus congestion and sinus pain, sore throat, dysphagia,  hemoptysis , cough, dyspnea, wheezing, chest pain, palpitations, orthopnea,  edema, abdominal pain, nausea, melena, diarrhea, constipation, flank pain, dysuria, hematuria, urinary  Frequency, nocturia, numbness, tingling, seizures,  Focal weakness, Loss of consciousness,  Tremor, insomnia, depression, anxiety, and suicidal ideation.      Objective:  BP 134/66 (BP Location: Left Arm, Patient Position: Sitting, Cuff Size: Normal)   Pulse 95   Temp 98.1 F (36.7 C) (Oral)   Resp 14   Ht 5\' 3"  (1.6 m)   Wt 169 lb (76.7 kg)   SpO2 94%   BMI 29.94 kg/m   BP Readings from Last 3 Encounters:  05/02/18 134/66  04/08/18 136/78  03/03/18 (!) 155/76    Wt Readings from Last 3 Encounters:  05/02/18 169 lb (76.7 kg)  04/08/18 165 lb 12 oz (75.2 kg)  01/17/18 169 lb 9.6 oz (76.9 kg)    General appearance: alert, cooperative and appears stated age Ears: normal TM's and external ear canals both ears Throat: lips, mucosa, and tongue normal; teeth and gums normal Neck: no adenopathy, no carotid bruit, supple, symmetrical, trachea midline and thyroid not enlarged, symmetric, no tenderness/mass/nodules Back: symmetric, no curvature. ROM normal. No CVA tenderness. Lungs: clear to auscultation bilaterally Heart: regular rate and rhythm, S1, S2 normal, no murmur, click, rub or gallop Abdomen: soft, non-tender; bowel sounds normal; no masses,  no organomegaly Pulses: 2+ and symmetric Skin: Skin color, texture, turgor normal. No rashes or lesions Lymph nodes: Cervical, supraclavicular, and axillary nodes normal.  Lab Results  Component Value Date   HGBA1C 5.1 10/26/2015   HGBA1C 5.4 11/25/2013    Lab Results  Component Value Date   CREATININE 1.00 12/13/2017   CREATININE 0.99 06/28/2017   CREATININE 0.98 06/06/2017    Lab Results  Component Value Date   WBC 8.8 12/13/2017   HGB 10.9 (L) 12/13/2017   HCT 34.2 (L) 12/13/2017   PLT 263 12/13/2017   GLUCOSE 95 12/13/2017   CHOL 152 10/26/2015   TRIG 109 10/26/2015   HDL 32 (L) 10/26/2015   LDLDIRECT 104 (H)  08/15/2012   LDLCALC 98 10/26/2015   ALT 20 12/13/2017   AST 19 12/13/2017   NA 137 12/13/2017   K 4.3 12/13/2017   CL 105 12/13/2017   CREATININE 1.00 12/13/2017   BUN 11 12/13/2017   CO2 23 12/13/2017   TSH 0.915 10/26/2015   INR 1.0 09/01/2014   HGBA1C 5.1 10/26/2015    Dg Inject Diag/thera/inc Needle/cath/plc Epi/cerv/thor W/img  Result Date: 03/03/2018 CLINICAL DATA:  Cervical spondylosis without myelopathy. LEFT arm radicular symptoms. FLUOROSCOPY TIME:  34 seconds corresponding to a Dose Area Product of 18.73 Gy*m2 PROCEDURE: Informed written consent was obtained.  Time-out was performed. An appropriate skin entry site was chosen, cleansed with Betadine, and anesthetized with 1% lidocaine. CERVICAL EPIDURAL INJECTION An interlaminar approach was performed on  the LEFT at C7-T1. A 20 gauge epidural needle was advanced using loss-of-resistance technique. DIAGNOSTIC EPIDURAL INJECTION Injection of Isovue-M 300 initially showed extra-spinal spread, further advancement shows epidural spread, primarily on the LEFT. No vascular opacification is seen. THERAPEUTIC EPIDURAL INJECTION 1.5 ml of Kenalog 40 mixed with 1 ml of 1% Lidocaine and 2 ml of normal saline were then instilled. The procedure was well-tolerated, and the patient was discharged thirty minutes following the injection in good condition. IMPRESSION: Technically successful first epidural injection on the LEFT at C7-T1. Electronically Signed   By: Elsie Stain M.D.   On: 03/03/2018 16:07    Assessment & Plan:   Problem List Items Addressed This Visit    Anxiety    increasing her dose of Effexor to 75 mg       Relevant Medications   venlafaxine XR (EFFEXOR-XR) 75 MG 24 hr capsule   Chronic neck pain    She has persistent pain and an MRI cervical spine was done.   report reviewed with her today ,  Refill history confirmed via [place] Controlled Substance databas, accessed by me today..      Relevant Medications   venlafaxine XR  (EFFEXOR-XR) 75 MG 24 hr capsule   HYDROcodone-acetaminophen (NORCO/VICODIN) 5-325 MG tablet (Start on 05/09/2018)   HYDROcodone-acetaminophen (NORCO/VICODIN) 5-325 MG tablet (Start on 06/08/2018)   HYDROcodone-acetaminophen (NORCO/VICODIN) 5-325 MG tablet (Start on 07/08/2018)   CKD (chronic kidney disease) stage 2, GFR 60-89 ml/min   Relevant Orders   Comprehensive metabolic panel   Dyslipidemia (high LDL; low HDL) - Primary    She is willing to take Crestor.        Relevant Medications   rosuvastatin (CRESTOR) 10 MG tablet   Other Relevant Orders   Lipid panel    Other Visit Diagnoses    Other secondary osteoarthritis of both shoulders       Relevant Medications   HYDROcodone-acetaminophen (NORCO/VICODIN) 5-325 MG tablet (Start on 05/09/2018)   HYDROcodone-acetaminophen (NORCO/VICODIN) 5-325 MG tablet (Start on 06/08/2018)   HYDROcodone-acetaminophen (NORCO/VICODIN) 5-325 MG tablet (Start on 07/08/2018)     A total of 25 minutes of face to face time was spent with patient more than half of which was spent in counselling about the above mentioned conditions  and coordination of care  I have discontinued Cheryl [name]'s predniSONE. I have also changed her venlafaxine XR. Additionally, I am having her start on rosuvastatin. Lastly, I am having her maintain her Calcium Carbonate-Vit D-Min (CALCIUM 1200 PO), vitamin C, progesterone, Docusate Calcium (STOOL SOFTENER PO), EPINEPHrine, Cyanocobalamin (VITAMIN B 12 PO), hydrOXYzine, clobetasol ointment, Diclofenac Sodium, nitroGLYCERIN, propranolol, Lactulose, cyclobenzaprine, traMADol, gabapentin, gabapentin, buPROPion, losartan, HYDROcodone-acetaminophen, HYDROcodone-acetaminophen, and HYDROcodone-acetaminophen.  Meds ordered this encounter  Medications  . rosuvastatin (CRESTOR) 10 MG tablet    Sig: Take 1 tablet (10 mg total) by mouth daily.    Dispense:  90 tablet    Refill:  11  . venlafaxine XR (EFFEXOR-XR) 75 MG 24 hr capsule     Sig: Take 1 capsule (75 mg total) by mouth daily with breakfast.    Dispense:  90 capsule    Refill:  1  . HYDROcodone-acetaminophen (NORCO/VICODIN) 5-325 MG tablet    Sig: Take 1 tablet by mouth at bedtime as needed. For pain    Dispense:  30 tablet    Refill:  0  . HYDROcodone-acetaminophen (NORCO/VICODIN) 5-325 MG tablet    Sig: Take 1 tablet by mouth at bedtime as needed. For pain  Dispense:  30 tablet    Refill:  0    May refill on or after Jun 08 2018  . HYDROcodone-acetaminophen (NORCO/VICODIN) 5-325 MG tablet    Sig: Take 1 tablet by mouth at bedtime as needed. For pain    Dispense:  30 tablet    Refill:  0    May refill on or after Jul 08 2018    Medications Discontinued During This Encounter  Medication Reason  . predniSONE (DELTASONE) 10 MG tablet Completed Course  . venlafaxine XR (EFFEXOR XR) 37.5 MG 24 hr capsule   . HYDROcodone-acetaminophen (NORCO/VICODIN) 5-325 MG tablet Reorder    Follow-up: Return in about 3 months (around 08/01/2018) for med refill .   Sherlene Shams, MD

## 2018-05-04 NOTE — Assessment & Plan Note (Signed)
increasing her dose of Effexor to 75 mg

## 2018-05-04 NOTE — Assessment & Plan Note (Addendum)
She has persistent pain and an MRI cervical spine was done.   report reviewed with her today ,  Refill history confirmed via [place] Controlled Substance databas, accessed by me today.[name]. Refill for 3 months on Vicodin given

## 2018-05-04 NOTE — Assessment & Plan Note (Signed)
She is willing to take Crestor.

## 2018-05-31 ENCOUNTER — Encounter: Payer: Self-pay | Admitting: Gynecology

## 2018-05-31 ENCOUNTER — Ambulatory Visit
Admission: EM | Admit: 2018-05-31 | Discharge: 2018-05-31 | Disposition: A | Discharge status: Home / Self Care | Payer: BLUE CROSS/BLUE SHIELD | Attending: Emergency Medicine | Admitting: Emergency Medicine

## 2018-05-31 ENCOUNTER — Other Ambulatory Visit: Payer: Self-pay

## 2018-05-31 DIAGNOSIS — J029 Acute pharyngitis, unspecified: Secondary | ICD-10-CM

## 2018-05-31 DIAGNOSIS — J069 Acute upper respiratory infection, unspecified: Secondary | ICD-10-CM

## 2018-05-31 HISTORY — DX: Disorder of kidney and ureter, unspecified: N28.9

## 2018-05-31 HISTORY — DX: Chronic kidney disease, stage 3 unspecified: N18.30

## 2018-05-31 HISTORY — DX: Chronic kidney disease, stage 3 (moderate): N18.3

## 2018-05-31 LAB — RAPID STREP SCREEN (MED CTR MEBANE ONLY): Streptococcus, Group A Screen (Direct): NEGATIVE

## 2018-05-31 MED ORDER — FLUTICASONE PROPIONATE 50 MCG/ACT NA SUSP: 2.0000 | Freq: Every day | NASAL | 0 refills | Status: DC

## 2018-05-31 MED ORDER — BENZONATATE 200 MG PO CAPS: ORAL_CAPSULE | ORAL | 0 refills | Status: DC

## 2018-05-31 MED ORDER — GUAIFENESIN-CODEINE 100-10 MG/5ML PO SYRP: 5.0000 mL | ORAL_SOLUTION | Freq: Three times a day (TID) | ORAL | 0 refills | Status: DC | PRN

## 2018-05-31 NOTE — ED Triage Notes (Signed)
Per patient with sore throat x 5 days. Per patient with ear pain and painful to swallow.

## 2018-05-31 NOTE — ED Provider Notes (Signed)
[facility]    CSN: [id number] Arrival date & time: 05/31/18  0843     History   Chief Complaint Chief Complaint  Patient presents with  . Sore Throat    HPI Cheryl [name] is a 63 y.o. female.   HPI  -year-old female presents with a sore throat that she has had for 3 days.  Is also noticed ear pain and pain to swallow.  She has had a nagging cough as well.  She is afebrile.  Pulse rate is 112 O2 sats 90% on room air.  Respirations are 16.         Past Medical History:  Diagnosis Date  . 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 Arrhythmia    left bundle branch block  . Arthritis   . Blood in stool   . Chicken pox   . Generalized headaches   . History of blood transfusion   . Kidney disease, chronic, stage III (GFR 30-59 ml/min) (HCC)   . LBBB (left bundle branch block)   . Renal disorder   . Thyroid disease   . UTI (lower urinary tract infection)     Patient Active Problem List   Diagnosis Date Noted  . Cervical radiculopathy 12/30/2017  . Carotid artery stenosis 09/09/2017  . Right lateral epicondylitis 08/13/2017  . Nonallopathic lesion of cervical region 08/13/2017  . Nonallopathic lesion of thoracic region 08/13/2017  . Nonallopathic lesion of lumbosacral region 08/13/2017  . CKD (chronic kidney disease) stage 2, GFR 60-89 ml/min 04/03/2017  . Renal hypertension 12/16/2016  . Diverticulosis of colon 09/02/2016  . PAD (peripheral artery disease) (HCC) 08/22/2016  . Renal artery stenosis (HCC) 08/22/2016  . Abnormal mammogram of right breast 06/03/2016  . Greater trochanteric bursitis of right hip 12/02/2015  . Iron deficiency anemia 11/25/2015  . Microcytic anemia 11/15/2015  . 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 ANA positive 06/21/2015  . Chronic neck pain 06/21/2015  . Arthralgia 05/19/2015    . Dyslipidemia (high LDL; low HDL) 04/16/2015  . Palpitations 03/28/2015  . Pancreatic cyst 10/24/2014  . Multiple lung nodules 10/24/2014  . Hyperlipidemia 10/20/2014  . Chronic lower back pain 09/18/2013  . Screening for osteoporosis 08/20/2013  . Weight gain 08/20/2013  . Routine general medical examination at a health care facility 10/26/2012  . Anxiety 08/22/2012  . Irritable bowel syndrome 08/17/2012  . History of tobacco abuse 08/17/2012  . Hearing loss d/t noise 08/17/2012  . Diverticulosis 08/17/2012  . Sleep apnea, obstructive 08/17/2012  . Hematuria, microscopic 08/16/2012  . Thalassemia minor 08/16/2012    Past Surgical History:  Procedure Laterality Date  . ABDOMINAL HYSTERECTOMY  1997  . APPENDECTOMY  1981  . BREAST BIOPSY Bilateral 1976   neg  . BREAST SURGERY  1976  . CARDIAC CATHETERIZATION  2004   [facility]  . CARDIAC CATHETERIZATION  2006   [facility]  . cystic fibrosis tumor removal  1983  . THUMB AMPUTATION  1992   traumatic  . TONSILLECTOMY AND ADENOIDECTOMY  1964    OB History    Gravida  3   Para  3   Term      Preterm      AB      Living  3     SAB      TAB      Ectopic  Multiple      Live Births           Obstetric Comments  1st Menstrual Cycle: 15 1st Pregnancy: 17         Home Medications    Prior to Admission medications   Medication Sig Start Date End Date Taking? Authorizing Provider  Calcium Carbonate-Vit D-Min (CALCIUM 1200 PO) Take 1 tablet by mouth daily.   Yes [provider]  clobetasol ointment (TEMOVATE) 0.05 % Apply 1 application topically 2 (two) times daily. Until resolved 05/03/14  Yes Sherlene Shams, MD  Cyanocobalamin (VITAMIN B 12 PO) Take by mouth.   Yes [provider]  cyclobenzaprine (FLEXERIL) 5 MG tablet 1-2 TABLETS AT BEDTIME, CAN BE INCREASED TO TWICE A DAY AS NEEDED FOR MUSCLE SPASM 01/16/16  Yes [provider]  Diclofenac Sodium 1.5 % SOLN Apply to painful joint  twice daily 10/20/14  Yes Sherlene Shams, MD  Docusate Calcium (STOOL SOFTENER PO) Take by mouth.   Yes [provider]  EPINEPHrine (EPIPEN) 0.3 mg/0.3 mL DEVI Inject 0.3 mLs (0.3 mg total) into the muscle once. Patient taking differently: Inject 0.3 mg into the muscle as needed.  08/15/12  Yes Sherlene Shams, MD  gabapentin (NEURONTIN) 100 MG capsule TAKE 2 CAPSULES(200 MG) BY MOUTH AT BEDTIME 02/10/18  Yes Antoine Primas M, DO  hydrOXYzine (ATARAX/VISTARIL) 25 MG tablet TAKE 1 TABLET (25 MG TOTAL) BY MOUTH 3 (THREE) TIMES DAILY AS NEEDED FOR ITCHING. 12/14/13  Yes Sherlene Shams, MD  losartan (COZAAR) 50 MG tablet Take 50 mg by mouth daily.   Yes [provider]  nitroGLYCERIN (NITROSTAT) 0.4 MG SL tablet Place 1 tablet (0.4 mg total) under the tongue every 5 (five) minutes as needed for chest pain. 03/28/15  Yes Gollan, Tollie Pizza, MD  progesterone (PROMETRIUM) 200 MG capsule Take 200 mg by mouth daily.   Yes [provider]  rosuvastatin (CRESTOR) 10 MG tablet Take 1 tablet (10 mg total) by mouth daily. 05/02/18  Yes Sherlene Shams, MD  traMADol (ULTRAM) 50 MG tablet Take 1 tablet (50 mg total) by mouth 2 (two) times daily as needed. for pain 02/16/18  Yes Sherlene Shams, MD  vitamin C (ASCORBIC ACID) 500 MG tablet Take 500 mg by mouth daily.   Yes [provider]  benzonatate (TESSALON) 200 MG capsule Take one cap TID PRN cough 05/31/18   Lutricia Feil, PA-C  fluticasone [facility]) 50 MCG/ACT nasal spray Place 2 sprays into both nostrils daily. 05/31/18   Lutricia Feil, PA-C  gabapentin (NEURONTIN) 300 MG capsule TAKE 1 CAPSULE(300 MG) BY MOUTH EVERY NIGHT AT BEDTIME 02/10/18   Judi Saa, DO  guaiFENesin-codeine (CHERATUSSIN AC) 100-10 MG/5ML syrup Take 5 mLs by mouth 3 (three) times daily as needed for cough. 05/31/18   Lutricia Feil, PA-C  venlafaxine XR (EFFEXOR-XR) 75 MG 24 hr capsule Take 1 capsule (75 mg total) by mouth daily with breakfast.  05/02/18   Sherlene Shams, MD    Family History Family History  Problem Relation Age of Onset  . Heart disease Mother   . Arthritis Mother   . Cancer Mother        breast  . Hyperlipidemia Mother   . Hypertension Mother   . Heart attack Mother   . Breast cancer Mother 48  . Heart disease Father   . Cancer Father        hodgkins disease, prostate  . Diabetes  Father   . Arthritis Father   . Hypertension Father   . Heart disease Sister   . Diabetes Sister   . Heart disease Brother [id number]       ami x 8,  4 vessel CABG   . Diabetes Brother   . Liver disease Brother   . Lung disease Brother   . Heart disease Maternal Grandmother   . Diabetes Maternal Grandmother   . Heart disease Maternal Grandfather   . Heart disease Paternal Grandmother   . Diabetes Paternal Grandmother   . Stroke Paternal Grandfather   . Heart disease Paternal Grandfather   . Diabetes Paternal Grandfather   . Diabetes Maternal Aunt     Social History Social History   Tobacco Use  . Smoking status: Former Smoker    Packs/day: 0.25    Years: 34.00    Pack years: 8.50    Types: Cigarettes    Last attempt to quit: 04/10/2013    Years since quitting: 5.1  . Smokeless tobacco: Never Used  Substance Use Topics  . Alcohol use: Yes    Comment: occasional  . Drug use: No     Allergies   Peanuts [peanut oil]; Penicillins; Sulfa antibiotics; Influenza vac split [flu virus vaccine]; Mobic [meloxicam]; and Nickel   Review of Systems Review of Systems  Constitutional: Positive for activity change. Negative for chills, fatigue and fever.  HENT: Positive for congestion, ear pain, postnasal drip, sore throat and trouble swallowing.   Respiratory: Positive for cough.   All other systems reviewed and are negative.    Physical Exam Triage Vital Signs ED Triage Vitals  Enc Vitals Group     BP 05/31/18 0924 136/85     Pulse Rate 05/31/18 0924 (!) 112     Resp 05/31/18 0924 16     Temp 05/31/18 0924 98.1  F (36.7 C)     Temp Source 05/31/18 0924 Oral     SpO2 05/31/18 0924 98 %     Weight 05/31/18 0925 165 lb (74.8 kg)     Height 05/31/18 0925 5\' 3"  (1.6 m)     Head Circumference --      Peak Flow --      Pain Score 05/31/18 0925 8     Pain Loc --      Pain Edu? --      Excl. in GC? --    No data found.  Updated Vital Signs BP 136/85 (BP Location: Left Arm)   Pulse (!) 112   Temp 98.1 F (36.7 C) (Oral)   Resp 16   Ht 5\' 3"  (1.6 m)   Wt 165 lb (74.8 kg)   SpO2 98%   BMI 29.23 kg/m   Visual Acuity Right Eye Distance:   Left Eye Distance:   Bilateral Distance:    Right Eye Near:   Left Eye Near:    Bilateral Near:     Physical Exam Vitals signs and nursing note reviewed.  Constitutional:      General: She is not in acute distress.    Appearance: She is well-developed. She is not ill-appearing, toxic-appearing or diaphoretic.  HENT:     Head: Normocephalic and atraumatic.     Right Ear: Tympanic membrane and ear canal normal. No tenderness. No middle ear effusion. Tympanic membrane is not erythematous.     Left Ear: Tympanic membrane and ear canal normal. No tenderness.  No middle ear effusion. Tympanic membrane is not erythematous.     Nose:  Congestion present.     Mouth/Throat:     Mouth: Mucous membranes are moist. No oral lesions.     Pharynx: Oropharynx is clear. Uvula midline. No pharyngeal swelling, oropharyngeal exudate, posterior oropharyngeal erythema or uvula swelling.     Tonsils: No tonsillar exudate or tonsillar abscesses. Swelling: 0 on the right. 0 on the left.  Eyes:     Conjunctiva/sclera: Conjunctivae normal.  Neck:     Musculoskeletal: Normal range of motion and neck supple.  Pulmonary:     Effort: Pulmonary effort is normal.     Breath sounds: Normal breath sounds.  Lymphadenopathy:     Cervical: No cervical adenopathy.  Skin:    General: Skin is warm and dry.  Neurological:     General: No focal deficit present.     Mental Status: She  is alert and oriented to person, place, and time.  Psychiatric:        Mood and Affect: Mood normal.        Behavior: Behavior normal.      UC Treatments / Results  Labs (all labs ordered are listed, but only abnormal results are displayed) Labs Reviewed  RAPID STREP SCREEN ([facility] ONLY)  CULTURE, GROUP A STREP [facility])    EKG None  Radiology No results found.  Procedures Procedures (including critical care time)  Medications Ordered in UC Medications - No data to display  Initial Impression / Assessment and Plan / UC Course  I have reviewed the triage vital signs and the nursing notes.  Pertinent labs & imaging results that were available during my care of the patient were reviewed by me and considered in my medical decision making (see chart for details).    Told the patient she has an upper respiratory infection.  Does not require antibiotics I have prescribed Flonase nose nasal spray.  Prescribe Tessalon Perles for her daytime cough if given her a very small supply of Cheratussin for cough relief at nighttime.  Does receive chronic pain medications through one physician Dr. Darrick Huntsman.  He is not improved she should follow-up with her primary care   Final Clinical Impressions(s) / UC Diagnoses   Final diagnoses:  Sore throat  Upper respiratory tract infection, unspecified type   Discharge Instructions   None    ED Prescriptions    Medication Sig Dispense Auth. Provider   benzonatate (TESSALON) 200 MG capsule Take one cap TID PRN cough 30 capsule Ovid Curd P, PA-C   guaiFENesin-codeine (CHERATUSSIN AC) 100-10 MG/5ML syrup Take 5 mLs by mouth 3 (three) times daily as needed for cough. 60 mL Ovid Curd P, PA-C   fluticasone (FLONASE) 50 MCG/ACT nasal spray Place 2 sprays into both nostrils daily. 16 g Lutricia Feil, PA-C     Controlled Substance Prescriptions [place] Controlled Substance Registry consulted? Not Applicable   Lutricia Feil,  PA-C 05/31/18 1022

## 2018-06-03 LAB — CULTURE, GROUP A STREP (THRC)

## 2018-06-06 ENCOUNTER — Encounter: Payer: Self-pay | Admitting: Family Medicine

## 2018-06-06 ENCOUNTER — Ambulatory Visit: Payer: BLUE CROSS/BLUE SHIELD | Admitting: Family Medicine

## 2018-06-06 VITALS — BP 132/60 | HR 96 | Temp 98.0°F | Resp 18 | Ht 63.0 in | Wt 166.6 lb

## 2018-06-06 DIAGNOSIS — J029 Acute pharyngitis, unspecified: Secondary | ICD-10-CM | POA: Diagnosis not present

## 2018-06-06 DIAGNOSIS — R0982 Postnasal drip: Secondary | ICD-10-CM | POA: Diagnosis not present

## 2018-06-06 DIAGNOSIS — R05 Cough: Secondary | ICD-10-CM

## 2018-06-06 DIAGNOSIS — R059 Cough, unspecified: Secondary | ICD-10-CM

## 2018-06-06 DIAGNOSIS — J988 Other specified respiratory disorders: Secondary | ICD-10-CM | POA: Diagnosis not present

## 2018-06-06 MED ORDER — AZITHROMYCIN 250 MG PO TABS: ORAL_TABLET | ORAL | 0 refills | Status: DC

## 2018-06-06 MED ORDER — ALBUTEROL SULFATE HFA 108 (90 BASE) MCG/ACT IN AERS: 2.0000 | INHALATION_SPRAY | Freq: Four times a day (QID) | RESPIRATORY_TRACT | 0 refills | Status: DC | PRN

## 2018-06-06 MED ORDER — METHYLPREDNISOLONE 4 MG PO TBPK: ORAL_TABLET | ORAL | 0 refills | Status: DC

## 2018-06-06 NOTE — Progress Notes (Signed)
Subjective:    Patient ID: Cheryl [name], female    DOB: 12/02/1955, 63 y.o.   MRN: [id number]  HPI   Patient presents to clinic c/o cough, congestion, ear soreness, chest congestion, scratchy throat for almost 10 days.  Patient states she did go to urgent care, was tested for strep which was negative and was prescribed cough medications and told her symptoms are most likely viral and that they should slowly resolve on their own.  Patient is getting frustrated because cough continues to persist and she continues to feel congestion and have scratchy throat.  Patient Active Problem List   Diagnosis Date Noted  . Cervical radiculopathy 12/30/2017  . Carotid artery stenosis 09/09/2017  . Right lateral epicondylitis 08/13/2017  . Nonallopathic lesion of cervical region 08/13/2017  . Nonallopathic lesion of thoracic region 08/13/2017  . Nonallopathic lesion of lumbosacral region 08/13/2017  . CKD (chronic kidney disease) stage 2, GFR 60-89 ml/min 04/03/2017  . Renal hypertension 12/16/2016  . Diverticulosis of colon 09/02/2016  . PAD (peripheral artery disease) (HCC) 08/22/2016  . Renal artery stenosis (HCC) 08/22/2016  . Abnormal mammogram of right breast 06/03/2016  . Greater trochanteric bursitis of right hip 12/02/2015  . Iron deficiency anemia 11/25/2015  . Microcytic anemia 11/15/2015  . 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 ANA positive 06/21/2015  . Chronic neck pain 06/21/2015  . Arthralgia 05/19/2015  . Dyslipidemia (high LDL; low HDL) 04/16/2015  . Palpitations 03/28/2015  . Pancreatic cyst 10/24/2014  . Multiple lung nodules 10/24/2014  . Hyperlipidemia 10/20/2014  . Chronic lower back pain 09/18/2013  . Screening for osteoporosis 08/20/2013  . Weight gain 08/20/2013  . Routine general medical examination at a health care facility 10/26/2012  . Anxiety 08/22/2012  . Irritable bowel syndrome 08/17/2012  . History of tobacco abuse 08/17/2012  . Hearing loss  d/t noise 08/17/2012  . Diverticulosis 08/17/2012  . Sleep apnea, obstructive 08/17/2012  . Hematuria, microscopic 08/16/2012  . Thalassemia minor 08/16/2012   Social History   Tobacco Use  . Smoking status: Former Smoker    Packs/day: 0.25    Years: 34.00    Pack years: 8.50    Types: Cigarettes    Last attempt to quit: 04/10/2013    Years since quitting: 5.1  . Smokeless tobacco: Never Used  Substance Use Topics  . Alcohol use: Yes    Comment: occasional   Review of Systems   Constitutional: Negative for chills, and fever. +fatigue  HENT: +congestion, ear pain, sinus pain and sore throat.   Eyes: Negative.   Respiratory: +cough, chest congestion. Negative for shortness of breath and wheezing.   Cardiovascular: Negative for chest pain, palpitations and leg swelling.  Gastrointestinal: Negative for abdominal pain, diarrhea, nausea and vomiting.  Genitourinary: Negative for dysuria, frequency and urgency.  Musculoskeletal: Negative for arthralgias and myalgias.  Skin: Negative for color change, pallor and rash.  Neurological: Negative for syncope, light-headedness and headaches.  Psychiatric/Behavioral: The patient is not nervous/anxious.       Objective:   Physical Exam Vitals signs and nursing note reviewed.  Constitutional:      Appearance: Normal appearance.  HENT:     Head: Normocephalic and atraumatic.     Ears:     Comments: Fullness bilateral TMs    Nose: Congestion present.     Comments: +post nasal drip    Mouth/Throat:     Mouth: Mucous membranes are moist.     Pharynx: Posterior oropharyngeal erythema present. No  oropharyngeal exudate.  Eyes:     General: No scleral icterus.    Extraocular Movements: Extraocular movements intact.     Conjunctiva/sclera: Conjunctivae normal.  Neck:     Musculoskeletal: Normal range of motion and neck supple. No neck rigidity.  Cardiovascular:     Rate and Rhythm: Normal rate and regular rhythm.  Pulmonary:      Effort: Pulmonary effort is normal. No respiratory distress.     Breath sounds: Wheezing (faint expiratory wheeze) present.     Comments: Raspy cough Musculoskeletal:     Right lower leg: No edema.     Left lower leg: No edema.  Skin:    General: Skin is warm and dry.     Coloration: Skin is not pale.  Neurological:     Mental Status: She is alert and oriented to person, place, and time.  Psychiatric:        Mood and Affect: Mood normal.        Behavior: Behavior normal.    Vitals:   06/06/18 1355  BP: 132/60  Pulse: 96  Resp: 18  Temp: 98 F (36.7 C)  SpO2: 98%      Assessment & Plan:   Respiratory infection, cough, postnasal drip, sore throat - due to length of time with symptoms and conservative over-the-counter treatment not helping them improve, suspect that it possibly started off as a viral URI/, cold and has developed into a possible bacterial bronchitis.  Patient will take Z-Pak, steroid taper and use albuterol inhaler to help improve cough/breathing.  Throat does not appear to look strep-like in nature, no exudates.  I suspect sore throat is related to patient's coughing and postnasal drip.  Patient advised to try taking an over-the-counter antihistamine such as Claritin to see if this helps dry up postnasal drainage.  Patient also advised to rest, do good handwashing, drink plenty of fluids.  Suggested Mucinex or Delsym cough syrup as a great over-the-counter option to help calm cough.  Offered chest x-ray in clinic today, but she declines.  Patient will keep regularly scheduled follow-up with PCP as planned.  She will return to clinic sooner if any issues arise.

## 2018-06-11 ENCOUNTER — Telehealth: Payer: Self-pay | Admitting: *Deleted

## 2018-06-11 DIAGNOSIS — Z1239 Encounter for other screening for malignant neoplasm of breast: Secondary | ICD-10-CM

## 2018-06-11 NOTE — Telephone Encounter (Signed)
Copied from CRM [id number]. Topic: General - Other >> Jun 11, 2018 11:21 AM Cheryl [name] wrote: Reason for CRM: Pt stated that she is ready to have the mammogram scheduled. Pt requests call back. Cb# [phone]

## 2018-06-11 NOTE — Telephone Encounter (Signed)
Screening mammogram has been ordered.   LMTCB with pt, need to let her know that she can call [facility] and schedule her mammogram, the number is [phone].  PEC may speak with pt.

## 2018-06-12 NOTE — Telephone Encounter (Signed)
Pt aware of message below

## 2018-06-13 DIAGNOSIS — E785 Hyperlipidemia, unspecified: Secondary | ICD-10-CM | POA: Diagnosis not present

## 2018-06-13 DIAGNOSIS — N182 Chronic kidney disease, stage 2 (mild): Secondary | ICD-10-CM | POA: Diagnosis not present

## 2018-06-14 LAB — COMPREHENSIVE METABOLIC PANEL
ALT: 11 IU/L (ref 0–32)
AST: 15 IU/L (ref 0–40)
Albumin/Globulin Ratio: 2.3 — ABNORMAL HIGH (ref 1.2–2.2)
Albumin: 4.3 g/dL (ref 3.6–4.8)
Alkaline Phosphatase: 83 IU/L (ref 39–117)
BUN/Creatinine Ratio: 13 (ref 12–28)
BUN: 12 mg/dL (ref 8–27)
Bilirubin Total: 0.9 mg/dL (ref 0.0–1.2)
CO2: 23 mmol/L (ref 20–29)
Calcium: 9.3 mg/dL (ref 8.7–10.3)
Chloride: 102 mmol/L (ref 96–106)
Creatinine, Ser: 0.9 mg/dL (ref 0.57–1.00)
GFR calc Af Amer: 79 mL/min/{1.73_m2} (ref >59)
GFR calc non Af Amer: 69 mL/min/{1.73_m2} (ref >59)
Globulin, Total: 1.9 g/dL (ref 1.5–4.5)
Glucose: 80 mg/dL (ref 65–99)
Potassium: 4.6 mmol/L (ref 3.5–5.2)
Sodium: 139 mmol/L (ref 134–144)
Total Protein: 6.2 g/dL (ref 6.0–8.5)

## 2018-06-14 LAB — LIPID PANEL
Chol/HDL Ratio: 2.9 ratio (ref 0.0–4.4)
Cholesterol, Total: 99 mg/dL — ABNORMAL LOW (ref 100–199)
HDL: 34 mg/dL — ABNORMAL LOW (ref >39)
LDL Calculated: 38 mg/dL (ref 0–99)
Triglycerides: 133 mg/dL (ref 0–149)
VLDL Cholesterol Cal: 27 mg/dL (ref 5–40)

## 2018-06-23 ENCOUNTER — Telehealth: Payer: Self-pay | Admitting: Internal Medicine

## 2018-06-23 ENCOUNTER — Encounter: Payer: Self-pay | Admitting: Internal Medicine

## 2018-06-23 ENCOUNTER — Ambulatory Visit: Payer: BLUE CROSS/BLUE SHIELD | Admitting: Internal Medicine

## 2018-06-23 ENCOUNTER — Ambulatory Visit (INDEPENDENT_AMBULATORY_CARE_PROVIDER_SITE_OTHER): Payer: BLUE CROSS/BLUE SHIELD

## 2018-06-23 VITALS — BP 124/70 | HR 90 | Temp 98.1°F | Resp 15 | Ht 63.0 in | Wt 166.0 lb

## 2018-06-23 DIAGNOSIS — R059 Cough, unspecified: Secondary | ICD-10-CM

## 2018-06-23 DIAGNOSIS — J029 Acute pharyngitis, unspecified: Secondary | ICD-10-CM | POA: Diagnosis not present

## 2018-06-23 DIAGNOSIS — R05 Cough: Secondary | ICD-10-CM | POA: Diagnosis not present

## 2018-06-23 DIAGNOSIS — B9789 Other viral agents as the cause of diseases classified elsewhere: Secondary | ICD-10-CM

## 2018-06-23 DIAGNOSIS — R6889 Other general symptoms and signs: Secondary | ICD-10-CM

## 2018-06-23 DIAGNOSIS — J069 Acute upper respiratory infection, unspecified: Secondary | ICD-10-CM | POA: Diagnosis not present

## 2018-06-23 LAB — POCT RAPID STREP A (OFFICE): Rapid Strep A Screen: NEGATIVE

## 2018-06-23 MED ORDER — METHYLPREDNISOLONE ACETATE 40 MG/ML IJ SUSP
40.0000 mg | Freq: Once | INTRAMUSCULAR | Status: AC
  Administered 2018-06-23: 40 mg via INTRAMUSCULAR

## 2018-06-23 MED ORDER — PREDNISONE 10 MG PO TABS: ORAL_TABLET | ORAL | 0 refills | Status: DC

## 2018-06-23 MED ORDER — GUAIFENESIN-CODEINE 100-10 MG/5ML PO SYRP: 10.0000 mL | ORAL_SOLUTION | Freq: Three times a day (TID) | ORAL | 0 refills | Status: DC | PRN

## 2018-06-23 NOTE — Telephone Encounter (Unsigned)
Copied from CRM [id number]. Topic: Quick Communication - Rx Refill/Question >> Jun 23, 2018  8:16 AM Crist Infante wrote: Medication: azithromycin (ZITHROMAX) 250 MG tablet (or another abx)  Pt was seen 1/10 by Leotis Shames and got better, but now states it is starting all over again.  Pt does not think she ever got over it to begin with.  Pt has same sx, sore throat, glands hurt, and ears ache. Pt hopes Lauren will call in another refill for her. Pt declined an appt, but states ok to call her if you need to.  [id number] - [place], [place] - [address] [place] [address] AT [facility] OF [address] [place] [address] [phone] (Phone) [phone] (Fax)

## 2018-06-23 NOTE — Telephone Encounter (Signed)
Called pt to get more information. She was seen in the urgent care on 1/4 and then again in our office on 1/10. Given z-pak, prenisone, tessalon perles, albuterol inhaler. Pt is also using Robitussin prn for cough. She stated that the zpak did help some but did not clear up symptoms completely. On Saturday, pt started having bodyaches and then yesterday she felt like she was running a fever but did not check temp. She is still having persistent but nonproductive cough, post nasal drip and chest tightness. Saturday was the first day that she was able to eat any solid food but then started feeling worse that evening. Pt had negative strep test while in the office but was not tested for the flu. I have scheduled pt for appt with Dr. Darrick Huntsman today at 4:30.

## 2018-06-23 NOTE — Progress Notes (Signed)
Subjective:  Patient ID: Cheryl [name], female    DOB: [birth year]  Age: 63 y.o. MRN: [id number]  CC: The primary encounter diagnosis was Sore throat. Diagnoses of Flu-like symptoms, Cough, and Viral URI with cough were also pertinent to this visit.  HPI Cheryl [name] presents for flu like symptoms.    Treated Jan 4 for  Viral URI  by Urgent care with flonase and tessalon along with cheratussin rapid strep and throat culture were negative .  Symptoms began on Dec 30  1 after visiting a friend's  Husband at [facility].  Symptoms didn not improve,  flonase made her vomiting.   Treated Jan 10 for persistent sysmptoms suggestive of bacterial bronchitis by [facility]; given a z pack and steroid taper and albuterol for wheezing . Symptoms improved but cough did not completely resolve.  2 days ago developed body aches , sore throat , cervical LAD,  and chest tightness,  Anorexia . Mild nausea,  Body aches    Outpatient Medications Prior to Visit  Medication Sig Dispense Refill  . albuterol (PROVENTIL HFA;VENTOLIN HFA) 108 (90 Base) MCG/ACT inhaler Inhale 2 puffs into the lungs every 6 (six) hours as needed for wheezing or shortness of breath. 1 Inhaler 0  . Calcium Carbonate-Vit D-Min (CALCIUM 1200 PO) Take 1 tablet by mouth daily.    . clobetasol ointment (TEMOVATE) 0.05 % Apply 1 application topically 2 (two) times daily. Until resolved 30 g 2  . Cyanocobalamin (VITAMIN B 12 PO) Take by mouth.    . cyclobenzaprine (FLEXERIL) 5 MG tablet 1-2 TABLETS AT BEDTIME, CAN BE INCREASED TO TWICE A DAY AS NEEDED FOR MUSCLE SPASM  2  . Diclofenac Sodium 1.5 % SOLN Apply to painful joint twice daily 150 mL 3  . Docusate Calcium (STOOL SOFTENER PO) Take by mouth.    . EPINEPHrine (EPIPEN) 0.3 mg/0.3 mL DEVI Inject 0.3 mLs (0.3 mg total) into the muscle once. (Patient taking differently: Inject 0.3 mg into the muscle as needed. ) 1 Device 5  . fluticasone (FLONASE) 50 MCG/ACT nasal spray Place 2 sprays  into both nostrils daily. 16 g 0  . gabapentin (NEURONTIN) 100 MG capsule TAKE 2 CAPSULES(200 MG) BY MOUTH AT BEDTIME 60 capsule 0  . gabapentin (NEURONTIN) 300 MG capsule TAKE 1 CAPSULE(300 MG) BY MOUTH EVERY NIGHT AT BEDTIME 90 capsule 1  . hydrOXYzine (ATARAX/VISTARIL) 25 MG tablet TAKE 1 TABLET (25 MG TOTAL) BY MOUTH 3 (THREE) TIMES DAILY AS NEEDED FOR ITCHING. 90 tablet 3  . losartan (COZAAR) 50 MG tablet Take 50 mg by mouth daily.    . nitroGLYCERIN (NITROSTAT) 0.4 MG SL tablet Place 1 tablet (0.4 mg total) under the tongue every 5 (five) minutes as needed for chest pain. 25 tablet 3  . progesterone (PROMETRIUM) 200 MG capsule Take 200 mg by mouth daily.    . rosuvastatin (CRESTOR) 10 MG tablet Take 1 tablet (10 mg total) by mouth daily. 90 tablet 11  . traMADol (ULTRAM) 50 MG tablet Take 1 tablet (50 mg total) by mouth 2 (two) times daily as needed. for pain 60 tablet 5  . venlafaxine XR (EFFEXOR-XR) 75 MG 24 hr capsule Take 1 capsule (75 mg total) by mouth daily with breakfast. 90 capsule 1  . vitamin C (ASCORBIC ACID) 500 MG tablet Take 500 mg by mouth daily.    [name] azithromycin (ZITHROMAX) 250 MG tablet Take 2 tablets on day 1, take 1 tablet on days 2-5 (Patient not taking: Reported  on 06/23/2018) 6 tablet 0  . benzonatate (TESSALON) 200 MG capsule Take one cap TID PRN cough (Patient not taking: Reported on 06/23/2018) 30 capsule 0  . guaiFENesin-codeine (CHERATUSSIN AC) 100-10 MG/5ML syrup Take 5 mLs by mouth 3 (three) times daily as needed for cough. (Patient not taking: Reported on 06/23/2018) 60 mL 0  . methylPREDNISolone (MEDROL DOSEPAK) 4 MG TBPK tablet Take according to pack instructions (Patient not taking: Reported on 06/23/2018) 21 tablet 0   No facility-administered medications prior to visit.     Review of Systems;  Patient denies headache, fevers, malaise, unintentional weight loss, skin rash, eye pain, sinus congestion and sinus pain, sore throat, dysphagia,  hemoptysis ,  cough, dyspnea, wheezing, chest pain, palpitations, orthopnea, edema, abdominal pain, nausea, melena, diarrhea, constipation, flank pain, dysuria, hematuria, urinary  Frequency, nocturia, numbness, tingling, seizures,  Focal weakness, Loss of consciousness,  Tremor, insomnia, depression, anxiety, and suicidal ideation.      Objective:  BP 124/70 (BP Location: Left Arm, Patient Position: Sitting, Cuff Size: Normal)   Pulse 90   Temp 98.1 F (36.7 C) (Oral)   Resp 15   Ht 5\' 3"  (1.6 m)   Wt 166 lb (75.3 kg)   SpO2 96%   BMI 29.41 kg/m   BP Readings from Last 3 Encounters:  06/23/18 124/70  06/06/18 132/60  05/31/18 136/85    Wt Readings from Last 3 Encounters:  06/23/18 166 lb (75.3 kg)  06/06/18 166 lb 9.6 oz (75.6 kg)  05/31/18 165 lb (74.8 kg)    General appearance: alert, cooperative and appears stated age Ears: normal TM's and external ear canals both ears Throat: lips, mucosa, and tongue normal; teeth and gums normal Neck: no adenopathy, no carotid bruit, supple, symmetrical, trachea midline and thyroid not enlarged, symmetric, no tenderness/mass/nodules Back: symmetric, no curvature. ROM normal. No CVA tenderness. Lungs: clear to auscultation bilaterally Heart: regular rate and rhythm, S1, S2 normal, no murmur, click, rub or gallop Abdomen: soft, non-tender; bowel sounds normal; no masses,  no organomegaly Pulses: 2+ and symmetric Skin: Skin color, texture, turgor normal. No rashes or lesions Lymph nodes: Cervical, supraclavicular, and axillary nodes normal.  Lab Results  Component Value Date   HGBA1C 5.1 10/26/2015   HGBA1C 5.4 11/25/2013    Lab Results  Component Value Date   CREATININE 0.90 06/13/2018   CREATININE 1.00 12/13/2017   CREATININE 0.99 06/28/2017    Lab Results  Component Value Date   WBC 11.3 (H) 06/23/2018   HGB 9.1 (L) 06/23/2018   HCT 29.4 (L) 06/23/2018   PLT 412 06/23/2018   GLUCOSE 80 06/13/2018   CHOL 99 (L) 06/13/2018   TRIG  133 06/13/2018   HDL 34 (L) 06/13/2018   LDLDIRECT 104 (H) 08/15/2012   LDLCALC 38 06/13/2018   ALT 11 06/13/2018   AST 15 06/13/2018   NA 139 06/13/2018   K 4.6 06/13/2018   CL 102 06/13/2018   CREATININE 0.90 06/13/2018   BUN 12 06/13/2018   CO2 23 06/13/2018   TSH 0.915 10/26/2015   INR 1.0 09/01/2014   HGBA1C 5.1 10/26/2015    No results found.  Assessment & Plan:   Problem List Items Addressed This Visit    Viral URI with cough    Rapid strep and flu tests  Were negative. Supportive care outlined with prednisone and cough suppressant        Other Visit Diagnoses    Sore throat    -  Primary  Relevant Orders   POCT rapid strep A (Completed)   Flu-like symptoms       Relevant Orders   POCT Influenza A/B   CBC with Differential/Platelet (Completed)   Cough       Relevant Medications   methylPREDNISolone acetate (DEPO-MEDROL) injection 40 mg (Completed)   Other Relevant Orders   DG Chest 2 View (Completed)     A total of 25 minutes of face to face time was spent with patient more than half of which was spent in counselling about the above mentioned conditions  and coordination of care   I have discontinued Samara DeistKathryn A. Buehl's benzonatate, guaiFENesin-codeine, azithromycin, and methylPREDNISolone. I am also having her start on predniSONE and guaiFENesin-codeine. Additionally, I am having her maintain her Calcium Carbonate-Vit D-Min (CALCIUM 1200 PO), vitamin C, progesterone, Docusate Calcium (STOOL SOFTENER PO), EPINEPHrine, Cyanocobalamin (VITAMIN B 12 PO), hydrOXYzine, clobetasol ointment, Diclofenac Sodium, nitroGLYCERIN, cyclobenzaprine, traMADol, gabapentin, gabapentin, losartan, rosuvastatin, venlafaxine XR, fluticasone, and albuterol. We administered methylPREDNISolone acetate.  Meds ordered this encounter  Medications  . methylPREDNISolone acetate (DEPO-MEDROL) injection 40 mg  . predniSONE (DELTASONE) 10 MG tablet    Sig: 6 tablets on Day 1 , then reduce  by 1 tablet daily until gone    Dispense:  21 tablet    Refill:  0  . guaiFENesin-codeine (CHERATUSSIN AC) 100-10 MG/5ML syrup    Sig: Take 10 mLs by mouth 3 (three) times daily as needed for cough.    Dispense:  180 mL    Refill:  0   A total of 25 minutes of face to face time was spent with patient more than half of which was spent in counselling about the above mentioned conditions  and coordination of care    Medications Discontinued During This Encounter  Medication Reason  . azithromycin (ZITHROMAX) 250 MG tablet Completed Course  . benzonatate (TESSALON) 200 MG capsule Completed Course  . guaiFENesin-codeine (CHERATUSSIN AC) 100-10 MG/5ML syrup Completed Course  . methylPREDNISolone (MEDROL DOSEPAK) 4 MG TBPK tablet Completed Course    Follow-up: No follow-ups on file.   Sherlene Shamseresa L Carlton Sweaney, MD

## 2018-06-23 NOTE — Patient Instructions (Signed)
I think you have a prolonged viral infection  Start the prednisone tomorrow.  I will add an antibitoic if the chest x ray suggest bronchitis or pneumonia  Add benadryl 25 mg  And cheratussin 1 hour before bedtime for the post nasal drip which is aggravating your sore throat and cough

## 2018-06-24 DIAGNOSIS — J069 Acute upper respiratory infection, unspecified: Secondary | ICD-10-CM | POA: Insufficient documentation

## 2018-06-24 DIAGNOSIS — B9789 Other viral agents as the cause of diseases classified elsewhere: Secondary | ICD-10-CM

## 2018-06-24 LAB — CBC WITH DIFFERENTIAL/PLATELET
Basophils Absolute: 0.1 10*3/uL (ref 0.0–0.2)
Basos: 1 %
EOS (ABSOLUTE): 0.2 10*3/uL (ref 0.0–0.4)
Eos: 2 %
Hematocrit: 29.4 % — ABNORMAL LOW (ref 34.0–46.6)
Hemoglobin: 9.1 g/dL — ABNORMAL LOW (ref 11.1–15.9)
Immature Grans (Abs): 0 10*3/uL (ref 0.0–0.1)
Immature Granulocytes: 0 %
Lymphocytes Absolute: 3.5 10*3/uL — ABNORMAL HIGH (ref 0.7–3.1)
Lymphs: 31 %
MCH: 21.8 pg — ABNORMAL LOW (ref 26.6–33.0)
MCHC: 31 g/dL — ABNORMAL LOW (ref 31.5–35.7)
MCV: 71 fL — ABNORMAL LOW (ref 79–97)
Monocytes Absolute: 1.1 10*3/uL — ABNORMAL HIGH (ref 0.1–0.9)
Monocytes: 10 %
NRBC: 1 % — ABNORMAL HIGH (ref 0–0)
Neutrophils Absolute: 6.4 10*3/uL (ref 1.4–7.0)
Neutrophils: 56 %
Platelets: 412 10*3/uL (ref 150–450)
RBC: 4.17 x10E6/uL (ref 3.77–5.28)
RDW: 18.5 % — ABNORMAL HIGH (ref 11.7–15.4)
WBC: 11.3 10*3/uL — ABNORMAL HIGH (ref 3.4–10.8)

## 2018-06-24 NOTE — Assessment & Plan Note (Addendum)
Rapid strep and flu tests  Were negative. Supportive care outlined with prednisone and cough suppressant

## 2018-06-25 LAB — POCT INFLUENZA A/B
Influenza A, POC: NEGATIVE
Influenza B, POC: NEGATIVE

## 2018-06-30 ENCOUNTER — Ambulatory Visit
Admission: RE | Admit: 2018-06-30 | Discharge: 2018-06-30 | Disposition: A | Discharge status: Home / Self Care | Payer: BLUE CROSS/BLUE SHIELD | Source: Ambulatory Visit | Attending: Internal Medicine | Admitting: Internal Medicine

## 2018-06-30 DIAGNOSIS — Z1239 Encounter for other screening for malignant neoplasm of breast: Secondary | ICD-10-CM | POA: Insufficient documentation

## 2018-06-30 DIAGNOSIS — Z1231 Encounter for screening mammogram for malignant neoplasm of breast: Secondary | ICD-10-CM | POA: Diagnosis not present

## 2018-07-29 DIAGNOSIS — N183 Chronic kidney disease, stage 3 (moderate): Secondary | ICD-10-CM | POA: Diagnosis not present

## 2018-07-29 DIAGNOSIS — D649 Anemia, unspecified: Secondary | ICD-10-CM | POA: Diagnosis not present

## 2018-07-29 DIAGNOSIS — I10 Essential (primary) hypertension: Secondary | ICD-10-CM | POA: Diagnosis not present

## 2018-08-01 ENCOUNTER — Ambulatory Visit: Payer: BLUE CROSS/BLUE SHIELD | Admitting: Internal Medicine

## 2018-08-01 ENCOUNTER — Encounter: Payer: Self-pay | Admitting: Internal Medicine

## 2018-08-01 DIAGNOSIS — M545 Low back pain, unspecified: Secondary | ICD-10-CM

## 2018-08-01 DIAGNOSIS — G4733 Obstructive sleep apnea (adult) (pediatric): Secondary | ICD-10-CM

## 2018-08-01 DIAGNOSIS — G8929 Other chronic pain: Secondary | ICD-10-CM

## 2018-08-01 DIAGNOSIS — I129 Hypertensive chronic kidney disease with stage 1 through stage 4 chronic kidney disease, or unspecified chronic kidney disease: Secondary | ICD-10-CM | POA: Diagnosis not present

## 2018-08-01 DIAGNOSIS — M19012 Primary osteoarthritis, left shoulder: Secondary | ICD-10-CM

## 2018-08-01 DIAGNOSIS — M19011 Primary osteoarthritis, right shoulder: Secondary | ICD-10-CM

## 2018-08-01 MED ORDER — TIZANIDINE HCL 2 MG PO TABS: 2.0000 mg | ORAL_TABLET | Freq: Four times a day (QID) | ORAL | 0 refills | Status: DC | PRN

## 2018-08-01 MED ORDER — HYDROCODONE-ACETAMINOPHEN 5-325 MG PO TABS: 1.0000 | ORAL_TABLET | Freq: Every day | ORAL | 0 refills | Status: DC | PRN

## 2018-08-01 NOTE — Progress Notes (Signed)
Subjective:  Patient ID: Cheryl [name], female    DOB: 13-Oct-1955  Age: 63 y.o. MRN: [id number]  CC: Diagnoses of Osteoarthritis of both shoulders, Chronic low back pain without sciatica, unspecified back pain laterality, Renal hypertension, and OSA (obstructive sleep apnea) were pertinent to this visit.  HPI [name] presents for medication  refill .  She has chronic low back pain managed with one vicodin daily .  She does not have radiculopathy.  She has developed bilateral shoulder pain which is coinciding with ne exercise program    Cc:  constant fatigue.    Saw [name] last week,  Iron was down .  Wants her to see [name]  For iron infusions.  waking up tired .  Has untreated OSA: Not wearing CPAP due to intolerance of mask   Spent "most of February" recovering from strep throat. (Treated  On Jan 27)    Feeling better now,  Has begun working out 3 days per week 2 weeks ago at a gym .   Outpatient Medications Prior to Visit  Medication Sig Dispense Refill  . albuterol (PROVENTIL HFA;VENTOLIN HFA) 108 (90 Base) MCG/ACT inhaler Inhale 2 puffs into the lungs every 6 (six) hours as needed for wheezing or shortness of breath. 1 Inhaler 0  . Calcium Carbonate-Vit D-Min (CALCIUM 1200 PO) Take 1 tablet by mouth daily.    . Cyanocobalamin (VITAMIN B 12 PO) Take by mouth.    . cyclobenzaprine (FLEXERIL) 5 MG tablet 1-2 TABLETS AT BEDTIME, CAN BE INCREASED TO TWICE A DAY AS NEEDED FOR MUSCLE SPASM  2  . Docusate Calcium (STOOL SOFTENER PO) Take by mouth.    . EPINEPHrine (EPIPEN) 0.3 mg/0.3 mL DEVI Inject 0.3 mLs (0.3 mg total) into the muscle once. (Patient taking differently: Inject 0.3 mg into the muscle as needed. ) 1 Device 5  . gabapentin (NEURONTIN) 100 MG capsule TAKE 2 CAPSULES(200 MG) BY MOUTH AT BEDTIME 60 capsule 0  . gabapentin (NEURONTIN) 300 MG capsule TAKE 1 CAPSULE(300 MG) BY MOUTH EVERY NIGHT AT BEDTIME 90 capsule 1  . hydrOXYzine (ATARAX/VISTARIL) 25 MG tablet  TAKE 1 TABLET (25 MG TOTAL) BY MOUTH 3 (THREE) TIMES DAILY AS NEEDED FOR ITCHING. 90 tablet 3  . losartan (COZAAR) 50 MG tablet Take 50 mg by mouth daily.    . nitroGLYCERIN (NITROSTAT) 0.4 MG SL tablet Place 1 tablet (0.4 mg total) under the tongue every 5 (five) minutes as needed for chest pain. 25 tablet 3  . progesterone (PROMETRIUM) 200 MG capsule Take 200 mg by mouth daily.    . rosuvastatin (CRESTOR) 10 MG tablet Take 1 tablet (10 mg total) by mouth daily. 90 tablet 11  . traMADol (ULTRAM) 50 MG tablet Take 1 tablet (50 mg total) by mouth 2 (two) times daily as needed. for pain 60 tablet 5  . venlafaxine XR (EFFEXOR-XR) 75 MG 24 hr capsule Take 1 capsule (75 mg total) by mouth daily with breakfast. 90 capsule 1  . vitamin C (ASCORBIC ACID) 500 MG tablet Take 500 mg by mouth daily.    . clobetasol ointment (TEMOVATE) 0.05 % Apply 1 application topically 2 (two) times daily. Until resolved (Patient not taking: Reported on 08/01/2018) 30 g 2  . Diclofenac Sodium 1.5 % SOLN Apply to painful joint twice daily (Patient not taking: Reported on 08/01/2018) 150 mL 3  . fluticasone (FLONASE) 50 MCG/ACT nasal spray Place 2 sprays into both nostrils daily. (Patient not taking: Reported on 08/01/2018) 16 g  0  . guaiFENesin-codeine (CHERATUSSIN AC) 100-10 MG/5ML syrup Take 10 mLs by mouth 3 (three) times daily as needed for cough. (Patient not taking: Reported on 08/01/2018) 180 mL 0  . predniSONE (DELTASONE) 10 MG tablet 6 tablets on Day 1 , then reduce by 1 tablet daily until gone (Patient not taking: Reported on 08/01/2018) 21 tablet 0   No facility-administered medications prior to visit.     Review of Systems;  Patient denies headache, fevers, malaise, unintentional weight loss, skin rash, eye pain, sinus congestion and sinus pain, sore throat, dysphagia,  hemoptysis , cough, dyspnea, wheezing, chest pain, palpitations, orthopnea, edema, abdominal pain, nausea, melena, diarrhea, constipation, flank pain,  dysuria, hematuria, urinary  Frequency, nocturia, numbness, tingling, seizures,  Focal weakness, Loss of consciousness,  Tremor, insomnia, depression, anxiety, and suicidal ideation.      Objective:  BP 130/60 (BP Location: Left Arm, Patient Position: Sitting, Cuff Size: Normal)   Pulse 95   Temp 97.6 F (36.4 C) (Oral)   Resp 15   Ht 5\' 3"  (1.6 m)   Wt 168 lb 9.6 oz (76.5 kg)   SpO2 98%   BMI 29.87 kg/m   BP Readings from Last 3 Encounters:  08/01/18 130/60  06/23/18 124/70  06/06/18 132/60    Wt Readings from Last 3 Encounters:  08/01/18 168 lb 9.6 oz (76.5 kg)  06/23/18 166 lb (75.3 kg)  06/06/18 166 lb 9.6 oz (75.6 kg)    General appearance: alert, cooperative and appears stated age Ears: normal TM's and external ear canals both ears Throat: lips, mucosa, and tongue normal; teeth and gums normal Neck: no adenopathy, no carotid bruit, supple, symmetrical, trachea midline and thyroid not enlarged, symmetric, no tenderness/mass/nodules Back: symmetric, no curvature. ROM normal. No CVA tenderness. Lungs: clear to auscultation bilaterally Heart: regular rate and rhythm, S1, S2 normal, no murmur, click, rub or gallop Abdomen: soft, non-tender; bowel sounds normal; no masses,  no organomegaly Pulses: 2+ and symmetric MSK: right shoulder with limited passiveinternal rotation and abduction to 180 degrees due to pain  Skin: Skin color, texture, turgor normal. No rashes or lesions Lymph nodes: Cervical, supraclavicular, and axillary nodes normal.  Lab Results  Component Value Date   HGBA1C 5.1 10/26/2015   HGBA1C 5.4 11/25/2013    Lab Results  Component Value Date   CREATININE 0.90 06/13/2018   CREATININE 1.00 12/13/2017   CREATININE 0.99 06/28/2017    Lab Results  Component Value Date   WBC 11.3 (H) 06/23/2018   HGB 9.1 (L) 06/23/2018   HCT 29.4 (L) 06/23/2018   PLT 412 06/23/2018   GLUCOSE 80 06/13/2018   CHOL 99 (L) 06/13/2018   TRIG 133 06/13/2018   HDL 34  (L) 06/13/2018   LDLDIRECT 104 (H) 08/15/2012   LDLCALC 38 06/13/2018   ALT 11 06/13/2018   AST 15 06/13/2018   NA 139 06/13/2018   K 4.6 06/13/2018   CL 102 06/13/2018   CREATININE 0.90 06/13/2018   BUN 12 06/13/2018   CO2 23 06/13/2018   TSH 0.915 10/26/2015   INR 1.0 09/01/2014   HGBA1C 5.1 10/26/2015    Mm 3d Screen Breast Bilateral  Result Date: 06/30/2018 CLINICAL DATA:  Screening. EXAM: DIGITAL SCREENING BILATERAL MAMMOGRAM WITH TOMO AND CAD COMPARISON:  Previous exam(s). ACR Breast Density Category c: The breast tissue is heterogeneously dense, which may obscure small masses. FINDINGS: There are no findings suspicious for malignancy. Images were processed with CAD. IMPRESSION: No mammographic evidence of malignancy. A result  letter of this screening mammogram will be mailed directly to the patient. RECOMMENDATION: Screening mammogram in one year. (Code:SM-B-01Y) BI-RADS CATEGORY  1: Negative. Electronically Signed   By: Frederico Hamman M.D.   On: 06/30/2018 15:46    Assessment & Plan:   Problem List Items Addressed This Visit    OSA (obstructive sleep apnea)    Untreated due to mask intolerance.  diagnosed with prior sleep study but treatment has been deferred d by patient.  Discussed the long term history of OSA , the risks of long term damage to heart and the signs and symptoms attributable to OSA.  Advised patient to consider seeing ENT for evaluation of airway to assess options for treatment of OSA.       Chronic lower back pain    MRI of lumbar spine done in 2018 did not show spinal stenosis,   But her pain was not controlled with Celebrex and it raised her blood pressure.  Continue  use of vicodin once daily at bedtime only. Suspended  celebrex and continue tramadol for  daytime use.   Refill history confirmed via [place] Controlled Substance databas, accessed by me today.   Refills  Dated for March 16, April 15 and Oct 10, 2018      Relevant Medications   tiZANidine  (ZANAFLEX) 2 MG tablet   HYDROcodone-acetaminophen (NORCO/VICODIN) 5-325 MG tablet (Start on 08/11/2018)   HYDROcodone-acetaminophen (NORCO/VICODIN) 5-325 MG tablet (Start on 09/10/2018)   HYDROcodone-acetaminophen (NORCO/VICODIN) 5-325 MG tablet (Start on 10/10/2018)   Renal hypertension    Improved with initiation of losartan . Cr and lytes unchanged   Lab Results  Component Value Date   CREATININE 0.90 06/13/2018   Lab Results  Component Value Date   NA 139 06/13/2018   K 4.6 06/13/2018   CL 102 06/13/2018   CO2 23 06/13/2018         Osteoarthritis of both shoulders    Advised to review her exercise plans with a PT to avoid injury of shoulders.       Relevant Medications   tiZANidine (ZANAFLEX) 2 MG tablet   HYDROcodone-acetaminophen (NORCO/VICODIN) 5-325 MG tablet (Start on 08/11/2018)   HYDROcodone-acetaminophen (NORCO/VICODIN) 5-325 MG tablet (Start on 09/10/2018)   HYDROcodone-acetaminophen (NORCO/VICODIN) 5-325 MG tablet (Start on 10/10/2018)      I have discontinued Samara Deist A. Pyeatt "Kathy"'s clobetasol ointment, Diclofenac Sodium, fluticasone, predniSONE, and guaiFENesin-codeine. I have also changed her HYDROcodone-acetaminophen, HYDROcodone-acetaminophen, and HYDROcodone-acetaminophen. Additionally, I am having her start on tiZANidine. Lastly, I am having her maintain her Calcium Carbonate-Vit D-Min (CALCIUM 1200 PO), vitamin C, progesterone, Docusate Calcium (STOOL SOFTENER PO), EPINEPHrine, Cyanocobalamin (VITAMIN B 12 PO), hydrOXYzine, nitroGLYCERIN, cyclobenzaprine, traMADol, gabapentin, gabapentin, losartan, rosuvastatin, venlafaxine XR, and albuterol.  Meds ordered this encounter  Medications  . tiZANidine (ZANAFLEX) 2 MG tablet    Sig: Take 1 tablet (2 mg total) by mouth every 6 (six) hours as needed for muscle spasms.    Dispense:  30 tablet    Refill:  0  . HYDROcodone-acetaminophen (NORCO/VICODIN) 5-325 MG tablet    Sig: Take 1 tablet by mouth daily as  needed for severe pain.    Dispense:  30 tablet    Refill:  0  . HYDROcodone-acetaminophen (NORCO/VICODIN) 5-325 MG tablet    Sig: Take 1 tablet by mouth daily as needed.    Dispense:  30 tablet    Refill:  0  . HYDROcodone-acetaminophen (NORCO/VICODIN) 5-325 MG tablet    Sig: Take 1 tablet by mouth daily  as needed.    Dispense:  30 tablet    Refill:  0   A total of 25 minutes of face to face time was spent with patient more than half of which was spent in counselling and coordination of care   Medications Discontinued During This Encounter  Medication Reason  . clobetasol ointment (TEMOVATE) 0.05 % Patient has not taken in last 30 days  . Diclofenac Sodium 1.5 % SOLN Patient has not taken in last 30 days  . fluticasone (FLONASE) 50 MCG/ACT nasal spray Patient has not taken in last 30 days  . guaiFENesin-codeine (CHERATUSSIN AC) 100-10 MG/5ML syrup Completed Course  . predniSONE (DELTASONE) 10 MG tablet Completed Course    Follow-up: No follow-ups on file.   Sherlene Shams, MD

## 2018-08-01 NOTE — Patient Instructions (Signed)
Referral to Hematology for iron infusions will be done   I agree with seeing Irene Limbo about the mouthpiece   congrats on starting to exercise!  Tizanidine for muscle relaxer

## 2018-08-03 DIAGNOSIS — M19012 Primary osteoarthritis, left shoulder: Principal | ICD-10-CM

## 2018-08-03 DIAGNOSIS — M19011 Primary osteoarthritis, right shoulder: Secondary | ICD-10-CM | POA: Insufficient documentation

## 2018-08-03 NOTE — Assessment & Plan Note (Signed)
MRI of lumbar spine done in 2018 did not show spinal stenosis,   But her pain was not controlled with Celebrex and it raised her blood pressure.  Continue  use of vicodin once daily at bedtime only. Suspended  celebrex and continue tramadol for  daytime use.   Refill history confirmed via [place] Controlled Substance databas, accessed by me today.   Refills  Dated for March 16, April 15 and Oct 10, 2018

## 2018-08-03 NOTE — Assessment & Plan Note (Signed)
Advised to review her exercise plans with a PT to avoid injury of shoulders.

## 2018-08-03 NOTE — Assessment & Plan Note (Signed)
Untreated due to mask intolerance.  diagnosed with prior sleep study but treatment has been deferred d by patient.  Discussed the long term history of OSA , the risks of long term damage to heart and the signs and symptoms attributable to OSA.  Advised patient to consider seeing ENT for evaluation of airway to assess options for treatment of OSA.

## 2018-08-03 NOTE — Assessment & Plan Note (Signed)
Improved with initiation of losartan . Cr and lytes unchanged   Lab Results  Component Value Date   CREATININE 0.90 06/13/2018   Lab Results  Component Value Date   NA 139 06/13/2018   K 4.6 06/13/2018   CL 102 06/13/2018   CO2 23 06/13/2018

## 2018-08-04 DIAGNOSIS — N951 Menopausal and female climacteric states: Secondary | ICD-10-CM | POA: Diagnosis not present

## 2018-08-04 DIAGNOSIS — R232 Flushing: Secondary | ICD-10-CM | POA: Diagnosis not present

## 2018-08-04 DIAGNOSIS — R5383 Other fatigue: Secondary | ICD-10-CM | POA: Diagnosis not present

## 2018-08-04 DIAGNOSIS — M2550 Pain in unspecified joint: Secondary | ICD-10-CM | POA: Diagnosis not present

## 2018-08-08 DIAGNOSIS — R232 Flushing: Secondary | ICD-10-CM | POA: Diagnosis not present

## 2018-08-08 DIAGNOSIS — N951 Menopausal and female climacteric states: Secondary | ICD-10-CM | POA: Diagnosis not present

## 2018-08-08 DIAGNOSIS — R5383 Other fatigue: Secondary | ICD-10-CM | POA: Diagnosis not present

## 2018-09-05 ENCOUNTER — Other Ambulatory Visit: Payer: Self-pay | Admitting: Family Medicine

## 2018-09-15 ENCOUNTER — Ambulatory Visit (INDEPENDENT_AMBULATORY_CARE_PROVIDER_SITE_OTHER): Payer: 59 | Admitting: Vascular Surgery

## 2018-09-15 ENCOUNTER — Encounter (INDEPENDENT_AMBULATORY_CARE_PROVIDER_SITE_OTHER): Payer: 59

## 2018-09-22 ENCOUNTER — Other Ambulatory Visit: Payer: Self-pay | Admitting: Internal Medicine

## 2018-10-13 ENCOUNTER — Other Ambulatory Visit: Payer: Self-pay | Admitting: Family Medicine

## 2018-10-31 DIAGNOSIS — M2550 Pain in unspecified joint: Secondary | ICD-10-CM | POA: Diagnosis not present

## 2018-10-31 DIAGNOSIS — R5383 Other fatigue: Secondary | ICD-10-CM | POA: Diagnosis not present

## 2018-10-31 DIAGNOSIS — N951 Menopausal and female climacteric states: Secondary | ICD-10-CM | POA: Diagnosis not present

## 2018-10-31 DIAGNOSIS — R232 Flushing: Secondary | ICD-10-CM | POA: Diagnosis not present

## 2018-11-03 ENCOUNTER — Ambulatory Visit (INDEPENDENT_AMBULATORY_CARE_PROVIDER_SITE_OTHER): Payer: BLUE CROSS/BLUE SHIELD

## 2018-11-03 ENCOUNTER — Other Ambulatory Visit: Payer: Self-pay

## 2018-11-03 ENCOUNTER — Ambulatory Visit (INDEPENDENT_AMBULATORY_CARE_PROVIDER_SITE_OTHER): Payer: BLUE CROSS/BLUE SHIELD | Admitting: Vascular Surgery

## 2018-11-03 ENCOUNTER — Encounter (INDEPENDENT_AMBULATORY_CARE_PROVIDER_SITE_OTHER): Payer: Self-pay | Admitting: Vascular Surgery

## 2018-11-03 VITALS — BP 129/78 | HR 98 | Resp 16 | Ht 63.0 in | Wt 164.8 lb

## 2018-11-03 DIAGNOSIS — I728 Aneurysm of other specified arteries: Secondary | ICD-10-CM

## 2018-11-03 DIAGNOSIS — N183 Chronic kidney disease, stage 3 unspecified: Secondary | ICD-10-CM

## 2018-11-03 DIAGNOSIS — I6523 Occlusion and stenosis of bilateral carotid arteries: Secondary | ICD-10-CM

## 2018-11-03 DIAGNOSIS — E782 Mixed hyperlipidemia: Secondary | ICD-10-CM

## 2018-11-03 DIAGNOSIS — I701 Atherosclerosis of renal artery: Secondary | ICD-10-CM | POA: Diagnosis not present

## 2018-11-03 DIAGNOSIS — Z79899 Other long term (current) drug therapy: Secondary | ICD-10-CM

## 2018-11-03 DIAGNOSIS — I774 Celiac artery compression syndrome: Secondary | ICD-10-CM

## 2018-11-03 DIAGNOSIS — Z7902 Long term (current) use of antithrombotics/antiplatelets: Secondary | ICD-10-CM

## 2018-11-03 DIAGNOSIS — I771 Stricture of artery: Secondary | ICD-10-CM

## 2018-11-03 NOTE — Progress Notes (Signed)
MRN : [id number]  Cheryl LaineKathryn Ann [name] is a 63 y.o. (06/19/1955) female who presents with chief complaint of  Chief Complaint  Patient presents with  . Follow-up    ultrasound follow up  .  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  The patient denies abdominal pain or postprandial symptoms.  The patient denies weight loss as well as nausea.  The patient does not substantiate food fear, particular foods do not seem to aggravate or alleviate the symptoms.  The patient denies bloody bowel movements or diarrhea.  The patient has a history of colonoscopy which was not diagnostic.  No history of peptic ulcer disease.   No prior peripheral angiograms or vascular interventions.  The patient denies amaurosis fugax or recent TIA symptoms. There are no recent neurological changes noted. The patient denies claudication symptoms or rest pain symptoms. The patient denies history of DVT, PE or superficial thrombophlebitis. The patient denies recent episodes of angina   Duplex ultrasound shows mild to moderate plaque at the origin of the SMA and the splenic artery measures 1.11 cm no significant change compared to last study  Current Meds  Medication Sig  . albuterol (PROVENTIL HFA;VENTOLIN HFA) 108 (90 Base) MCG/ACT inhaler Inhale 2 puffs into the lungs every 6 (six) hours as needed for wheezing or shortness of breath.  . Calcium Carbonate-Vit D-Min (CALCIUM 1200 PO) Take 1 tablet by mouth daily.  . Cyanocobalamin (VITAMIN B 12 PO) Take by mouth.  . cyclobenzaprine (FLEXERIL) 5 MG tablet 1-2 TABLETS AT BEDTIME, CAN BE INCREASED TO TWICE A DAY AS NEEDED FOR MUSCLE SPASM  . Docusate Calcium (STOOL SOFTENER PO) Take by mouth.  . EPINEPHrine (EPIPEN) 0.3 mg/0.3 mL DEVI Inject 0.3 mLs (0.3 mg total) into the muscle once. (Patient taking differently: Inject 0.3 mg into the muscle as  needed. )  . gabapentin (NEURONTIN) 100 MG capsule TAKE 2 CAPSULES BY MOUTH AT BEDTIME  . gabapentin (NEURONTIN) 300 MG capsule TAKE 1 CAPSULE BY MOUTH  EVERY NIGHT AT BEDTIME  . HYDROcodone-acetaminophen (NORCO/VICODIN) 5-325 MG tablet Take 1 tablet by mouth daily as needed for severe pain.  [name]. HYDROcodone-acetaminophen (NORCO/VICODIN) 5-325 MG tablet Take 1 tablet by mouth daily as needed.  [name]. HYDROcodone-acetaminophen (NORCO/VICODIN) 5-325 MG tablet Take 1 tablet by mouth daily as needed.  [name]. losartan (COZAAR) 50 MG tablet Take 50 mg by mouth daily.  . nitroGLYCERIN (NITROSTAT) 0.4 MG SL tablet Place 1 tablet (0.4 mg total) under the tongue every 5 (five) minutes as needed for chest pain.  . progesterone (PROMETRIUM) 200 MG capsule Take 200 mg by mouth daily.  . rosuvastatin (CRESTOR) 10 MG tablet Take 1 tablet (10 mg total) by mouth daily.  [name]. tiZANidine (ZANAFLEX) 2 MG tablet Take 1 tablet (2 mg total) by mouth every 6 (six) hours as needed for muscle spasms.  . traMADol (ULTRAM) 50 MG tablet TAKE 1 TABLET BY MOUTH TWICE DAILY AS NEEDED FOR PAIN  . venlafaxine XR (EFFEXOR-XR) 75 MG 24 hr capsule Take 1 capsule (75 mg total) by mouth daily with breakfast.  . vitamin C (ASCORBIC ACID) 500 MG tablet Take 500 mg by mouth daily.    Past Medical History:  Diagnosis Date  . 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 Arrhythmia  left bundle branch block  . Arthritis   . Blood in stool   . Chicken pox   . Generalized headaches   . History of blood transfusion   . Kidney disease, chronic, stage III (GFR 30-59 ml/min) (HCC)   . LBBB (left bundle branch block)   . Renal disorder   . Thyroid disease   . UTI (lower urinary tract infection)     Past Surgical History:  Procedure Laterality Date  . ABDOMINAL HYSTERECTOMY  1997  . APPENDECTOMY  1981  . BREAST  EXCISIONAL BIOPSY Bilateral 1976   neg  . BREAST SURGERY  1976  . CARDIAC CATHETERIZATION  2004   [facility]  . CARDIAC CATHETERIZATION  2006   [facility]  . cystic fibrosis tumor removal  1983  . THUMB AMPUTATION  1992   traumatic  . TONSILLECTOMY AND ADENOIDECTOMY  1964    Social History Social History   Tobacco Use  . Smoking status: Former Smoker    Packs/day: 0.25    Years: 34.00    Pack years: 8.50    Types: Cigarettes    Last attempt to quit: 04/10/2013    Years since quitting: 5.5  . Smokeless tobacco: Never Used  Substance Use Topics  . Alcohol use: Yes    Comment: occasional  . Drug use: No    Family History Family History  Problem Relation Age of Onset  . Heart disease Mother   . Arthritis Mother   . Cancer Mother        breast  . Hyperlipidemia Mother   . Hypertension Mother   . Heart attack Mother   . Breast cancer Mother [id number]  . Heart disease Father   . Cancer Father        hodgkins disease, prostate  . Diabetes Father   . Arthritis Father   . Hypertension Father   . Heart disease Sister   . Diabetes Sister   . Heart disease Brother [id number]       ami x 8,  4 vessel CABG   . Diabetes Brother   . Liver disease Brother   . Lung disease Brother   . Heart disease Maternal Grandmother   . Diabetes Maternal Grandmother   . Heart disease Maternal Grandfather   . Heart disease Paternal Grandmother   . Diabetes Paternal Grandmother   . Stroke Paternal Grandfather   . Heart disease Paternal Grandfather   . Diabetes Paternal Grandfather   . Diabetes Maternal Aunt     Allergies  Allergen Reactions  . Peanuts [Peanut Oil]   . Penicillins Other (See Comments)    Hives,rash,nausea,swelling,   . Sulfa Antibiotics Other (See Comments)    Hives,rash,nausea,swelling  . Influenza Vac Split [Flu Virus Vaccine]     Pleurisy  1996  . Mobic [Meloxicam] Nausea Only  . Nickel      REVIEW OF SYSTEMS (Negative unless checked)  Constitutional: [] Weight loss  [] Fever   [] Chills Cardiac: [] Chest pain   [] Chest pressure   [] Palpitations   [] Shortness of breath when laying flat   [] Shortness of breath with exertion. Vascular:  [] Pain in legs with walking   [] Pain in legs at rest  [] History of DVT   [] Phlebitis   [] Swelling in legs   [] Varicose veins   [] Non-healing ulcers Pulmonary:   [] Uses home oxygen   [] Productive cough   [] Hemoptysis   [] Wheeze  [] COPD   [] Asthma Neurologic:  [] Dizziness   [] Seizures   [] History of stroke   [] History of  TIA  [] Aphasia   [] Vissual changes   [] Weakness or numbness in arm   [] Weakness or numbness in leg Musculoskeletal:   [] Joint swelling   [] Joint pain   [] Low back pain Hematologic:  [] Easy bruising  [] Easy bleeding   [] Hypercoagulable state   [] Anemic Gastrointestinal:  [] Diarrhea   [] Vomiting  [] Gastroesophageal reflux/heartburn   [] Difficulty swallowing. Genitourinary:  [x] Chronic kidney disease   [] Difficult urination  [] Frequent urination   [] Blood in urine Skin:  [] Rashes   [] Ulcers  Psychological:  [] History of anxiety   []  History of major depression.  Physical Examination  Vitals:   11/03/18 0821  BP: 129/78  Pulse: 98  Resp: 16  Weight: 164 lb 12.8 oz (74.8 kg)  Height: 5\' 3"  (1.6 m)   Body mass index is 29.19 kg/m. Gen: WD/WN, NAD Head: [place]/AT, No temporalis wasting.  Ear/Nose/Throat: Hearing grossly intact, nares w/o erythema or drainage Eyes: PER, EOMI, sclera nonicteric.  Neck: Supple, no large masses.   Pulmonary:  Good air movement, no audible wheezing bilaterally, no use of accessory muscles.  Cardiac: RRR, no JVD Vascular: no abdominal bruits Vessel Right Left  Radial Palpable Palpable  PT Trace Palpable Trace Palpable  DP Not Palpable Not Palpable  Gastrointestinal: Non-distended. No guarding/no peritoneal signs.  Musculoskeletal: M/S 5/5 throughout.  No deformity or atrophy.  Neurologic: CN 2-12 intact. Symmetrical.  Speech is fluent. Motor exam as listed above. Psychiatric: Judgment  intact, Mood & affect appropriate for pt's clinical situation. Dermatologic: No rashes or ulcers noted.  No changes consistent with cellulitis. Lymph : No lichenification or skin changes of chronic lymphedema.  CBC Lab Results  Component Value Date   WBC 11.3 (H) 06/23/2018   HGB 9.1 (L) 06/23/2018   HCT 29.4 (L) 06/23/2018   MCV 71 (L) 06/23/2018   PLT 412 06/23/2018    BMET    Component Value Date/Time   NA 139 06/13/2018 0829   K 4.6 06/13/2018 0829   CL 102 06/13/2018 0829   CO2 23 06/13/2018 0829   GLUCOSE 80 06/13/2018 0829   GLUCOSE 95 12/13/2017 1336   BUN 12 06/13/2018 0829   CREATININE 0.90 06/13/2018 0829   CREATININE 0.95 09/01/2014 1659   CREATININE 0.80 08/15/2012 1609   CALCIUM 9.3 06/13/2018 0829   CALCIUM 9.7 09/01/2014 1659   GFRNONAA 69 06/13/2018 0829   GFRNONAA >60 09/01/2014 1659   GFRAA 79 06/13/2018 0829   GFRAA >60 09/01/2014 1659   CrCl cannot be calculated (Patient's most recent lab result is older than the maximum 21 days allowed.).  COAG Lab Results  Component Value Date   INR 1.0 09/01/2014    Radiology No results found.   Assessment/Plan 1. Splenic artery aneurysm (HCC) Recommend:  The patient has evidence of a small splenic artery aneurysm that is 1.20 cm.  It is unchanged compared to last year.  She denies left upper quadrent abdominal pain and N/V.    Patient should undergo annual surveillance.    The patient will follow up with me annually.  - VAS US MESENTERIC; Future  2. Renal artery stenosis (HCC) Given patient's arterial disease optimal control of the patient's hypertension is important. BP is acceptable today  The patient's vital signs and noninvasive studies support the renal artery stenosis is not significantly increased when compared to the previous study.  No invasive studies or intervention is indicated at this time.  The patient will continue the current antihypertensive medications, no changes at  this time.  The primary  medical service will continue aggressive antihypertensive therapy as per the AHA guidelines   3. Bilateral carotid artery stenosis Recommend:  Given the patient's asymptomatic subcritical stenosis no further invasive testing or surgery at this time.  Duplex ultrasound shows <50% stenosis bilaterally.  Continue antiplatelet therapy as prescribed Continue management of CAD, HTN and Hyperlipidemia Healthy heart diet,  encouraged exercise at least 4 times per week Follow up in 67months with duplex ultrasound and physical exam   4. Chronic renal impairment, stage 3 (moderate) (HCC) Avoid nephrotoxic medications and avoid dehydration  5. Mixed hyperlipidemia Continue statin as ordered and reviewed, no changes at this time    [name], MD  11/03/2018 8:42 AM

## 2018-11-07 ENCOUNTER — Ambulatory Visit (INDEPENDENT_AMBULATORY_CARE_PROVIDER_SITE_OTHER): Payer: BC Managed Care – PPO | Admitting: Internal Medicine

## 2018-11-07 ENCOUNTER — Encounter: Payer: Self-pay | Admitting: Internal Medicine

## 2018-11-07 ENCOUNTER — Other Ambulatory Visit: Payer: Self-pay

## 2018-11-07 DIAGNOSIS — N951 Menopausal and female climacteric states: Secondary | ICD-10-CM | POA: Diagnosis not present

## 2018-11-07 DIAGNOSIS — R5383 Other fatigue: Secondary | ICD-10-CM | POA: Diagnosis not present

## 2018-11-07 DIAGNOSIS — M19011 Primary osteoarthritis, right shoulder: Secondary | ICD-10-CM

## 2018-11-07 DIAGNOSIS — G8929 Other chronic pain: Secondary | ICD-10-CM

## 2018-11-07 DIAGNOSIS — G4733 Obstructive sleep apnea (adult) (pediatric): Secondary | ICD-10-CM | POA: Diagnosis not present

## 2018-11-07 DIAGNOSIS — M19012 Primary osteoarthritis, left shoulder: Secondary | ICD-10-CM

## 2018-11-07 DIAGNOSIS — M545 Low back pain, unspecified: Secondary | ICD-10-CM

## 2018-11-07 DIAGNOSIS — R232 Flushing: Secondary | ICD-10-CM | POA: Diagnosis not present

## 2018-11-07 DIAGNOSIS — Z7189 Other specified counseling: Secondary | ICD-10-CM

## 2018-11-07 DIAGNOSIS — G2581 Restless legs syndrome: Secondary | ICD-10-CM

## 2018-11-07 MED ORDER — HYDROCODONE-ACETAMINOPHEN 5-325 MG PO TABS: 1.0000 | ORAL_TABLET | Freq: Every day | ORAL | 0 refills | Status: DC | PRN

## 2018-11-07 NOTE — Patient Instructions (Signed)
I RECOMMEND SCHEDULING  A VISIT SO I CAN CHECK YOUR WALKING,  TREMOR AND BALANCE   REQUIP AND MIRAPEX ARE BOTH MEDS FOR RESTLESS LEGS

## 2018-11-07 NOTE — Progress Notes (Addendum)
Virtual Visit via [email or web address]  This visit type was conducted due to national recommendations for restrictions regarding the COVID-19 pandemic (e.g. social distancing).  This format is felt to be most appropriate for this patient at this time.  All issues noted in this document were discussed and addressed.  No physical exam was performed (except for noted visual exam findings with Video Visits).   I connected with@ on 11/07/18 at  2:00 PM EDT by a video enabled telemedicine application or telephone and verified that I am speaking with the correct person using two identifiers. Location patient: home Location provider: work or home office Persons participating in the virtual visit: patient, provider  I discussed the limitations, risks, security and privacy concerns of performing an evaluation and management service by telephone and the availability of in person appointments. I also discussed with the patient that there may be a patient responsible charge related to this service. The patient expressed understanding and agreed to proceed.  Reason for visit: medication refill   HPI:  The patient has no signs or symptoms of COVID 19 infection (fever, cough, sore throat  or shortness of breath beyond what is typical for patient).  Patient denies contact with other persons with the above mentioned symptoms or with anyone confirmed to have COVID 19.    She feels generally well , except that she has been having increased symptoms of restless legs which is keeping her up at night  She also notes an occasional tenor of both hands.  She denies having any trouble walking,  But occasionally loses her balance and "runs into the wall."  Denies symptoms of vertigo or dizziness.  Strong FH of Parkinson's Disease.     OSA: she is not USING CPAP , but prefers to have a mouth piee made and is WAITING TO SEE SANDRA FULLER  ROS: See pertinent positives and negatives per HPI.  Past Medical History:  Diagnosis Date  .  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 Arrhythmia    left bundle branch block  . Arthritis   . Blood in stool   . Chicken pox   . Generalized headaches   . History of blood transfusion   . Kidney disease, chronic, stage III (GFR 30-59 ml/min) (HCC)   . LBBB (left bundle branch block)   . Renal disorder   . Thyroid disease   . UTI (lower urinary tract infection)     Past Surgical History:  Procedure Laterality Date  . ABDOMINAL HYSTERECTOMY  1997  . APPENDECTOMY  1981  . BREAST EXCISIONAL BIOPSY Bilateral 1976   neg  . BREAST SURGERY  1976  . CARDIAC CATHETERIZATION  2004   [facility]  . CARDIAC CATHETERIZATION  2006   [facility]  . cystic fibrosis tumor removal  1983  . THUMB AMPUTATION  1992   traumatic  . TONSILLECTOMY AND ADENOIDECTOMY  1964    Family History  Problem Relation Age of Onset  . Heart disease Mother   . Arthritis Mother   . Cancer Mother        breast  . Hyperlipidemia Mother   . Hypertension Mother   . Heart attack Mother   . Breast cancer Mother 1544  . Heart disease Father   . Cancer Father        hodgkins disease, prostate  . Diabetes Father   . Arthritis Father   .  Hypertension Father   . Heart disease Sister   . Diabetes Sister   . Heart disease Brother [id number]       ami x 8,  4 vessel CABG   . Diabetes Brother   . Liver disease Brother   . Lung disease Brother   . Heart disease Maternal Grandmother   . Diabetes Maternal Grandmother   . Heart disease Maternal Grandfather   . Heart disease Paternal Grandmother   . Diabetes Paternal Grandmother   . Stroke Paternal Grandfather   . Heart disease Paternal Grandfather   . Diabetes Paternal Grandfather   . Diabetes Maternal Aunt     SOCIAL HX:  Social History   Tobacco Use  . Smoking status: Former Smoker    Packs/day: 0.25    Years: 34.00    Pack years: 8.50     Types: Cigarettes    Quit date: 04/10/2013    Years since quitting: 5.5  . Smokeless tobacco: Never Used  Substance Use Topics  . Alcohol use: Yes    Comment: occasional  . Drug use: No      Current Outpatient Medications:  .  Calcium Carbonate-Vit D-Min (CALCIUM 1200 PO), Take 1 tablet by mouth daily., Disp: , Rfl:  .  Cyanocobalamin (VITAMIN B 12 PO), Take by mouth., Disp: , Rfl:  .  cyclobenzaprine (FLEXERIL) 5 MG tablet, 1-2 TABLETS AT BEDTIME, CAN BE INCREASED TO TWICE A DAY AS NEEDED FOR MUSCLE SPASM, Disp: , Rfl: 2 .  Docusate Calcium (STOOL SOFTENER PO), Take by mouth., Disp: , Rfl:  .  EPINEPHrine (EPIPEN) 0.3 mg/0.3 mL DEVI, Inject 0.3 mLs (0.3 mg total) into the muscle once. (Patient taking differently: Inject 0.3 mg into the muscle as needed. ), Disp: 1 Device, Rfl: 5 .  gabapentin (NEURONTIN) 100 MG capsule, TAKE 2 CAPSULES BY MOUTH AT BEDTIME, Disp: 180 capsule, Rfl: 0 .  gabapentin (NEURONTIN) 300 MG capsule, TAKE 1 CAPSULE BY MOUTH  EVERY NIGHT AT BEDTIME, Disp: 90 capsule, Rfl: 1 .  [START ON 01/09/2019] HYDROcodone-acetaminophen (NORCO/VICODIN) 5-325 MG tablet, Take 1 tablet by mouth daily as needed for severe pain., Disp: 30 tablet, Rfl: 0 .  [START ON 12/10/2018] HYDROcodone-acetaminophen (NORCO/VICODIN) 5-325 MG tablet, Take 1 tablet by mouth daily as needed., Disp: 30 tablet, Rfl: 0 .  [START ON 11/10/2018] HYDROcodone-acetaminophen (NORCO/VICODIN) 5-325 MG tablet, Take 1 tablet by mouth daily as needed., Disp: 30 tablet, Rfl: 0 .  losartan (COZAAR) 50 MG tablet, Take 50 mg by mouth daily., Disp: , Rfl:  .  nitroGLYCERIN (NITROSTAT) 0.4 MG SL tablet, Place 1 tablet (0.4 mg total) under the tongue every 5 (five) minutes as needed for chest pain., Disp: 25 tablet, Rfl: 3 .  progesterone (PROMETRIUM) 200 MG capsule, Take 200 mg by mouth daily., Disp: , Rfl:  .  rosuvastatin (CRESTOR) 10 MG tablet, Take 1 tablet (10 mg total) by mouth daily., Disp: 90 tablet, Rfl: 11 .   tiZANidine (ZANAFLEX) 2 MG tablet, Take 1 tablet (2 mg total) by mouth every 6 (six) hours as needed for muscle spasms., Disp: 30 tablet, Rfl: 0 .  traMADol (ULTRAM) 50 MG tablet, TAKE 1 TABLET BY MOUTH TWICE DAILY AS NEEDED FOR PAIN, Disp: 60 tablet, Rfl: 3 .  venlafaxine XR (EFFEXOR-XR) 75 MG 24 hr capsule, Take 1 capsule (75 mg total) by mouth daily with breakfast., Disp: 90 capsule, Rfl: 1 .  vitamin C (ASCORBIC ACID) 500 MG tablet, Take 500 mg by mouth daily., Disp: , Rfl:  .  albuterol (PROVENTIL HFA;VENTOLIN HFA) 108 (90 Base) MCG/ACT inhaler, Inhale 2 puffs into the lungs every 6 (six) hours as needed for wheezing or shortness of breath. (Patient not taking: Reported on 11/07/2018), Disp: 1 Inhaler, Rfl: 0 .  hydrOXYzine (ATARAX/VISTARIL) 25 MG tablet, TAKE 1 TABLET (25 MG TOTAL) BY MOUTH 3 (THREE) TIMES DAILY AS NEEDED FOR ITCHING. (Patient not taking: Reported on 11/03/2018), Disp: 90 tablet, Rfl: 3  EXAM:  VITALS per patient if applicable:  GENERAL: alert, oriented, appears well and in no acute distress  HEENT: atraumatic, conjunttiva clear, no obvious abnormalities on inspection of external nose and ears  NECK: normal movements of the head and neck  LUNGS: on inspection no signs of respiratory distress, breathing rate appears normal, no obvious gross SOB, gasping or wheezing  CV: no obvious cyanosis  MS: moves all visible extremities without noticeable abnormality  PSYCH/NEURO: pleasant and cooperative, no obvious depression or anxiety, speech and thought processing grossly intact  ASSESSMENT AND PLAN:   Restless legs With concurrent Iron deficiency managed wit IV infusions by Hematology , last iron check was July 2019,  Has  thalassemia. Has a strong FH of Parkinson;'s Disease and requesting testing.  FTF visit needed   Lab Results  Component Value Date   IRON 80 12/13/2017   TIBC 299 12/13/2017   FERRITIN 176 12/13/2017     OSA (obstructive sleep apnea) Untreated  per patient preference.  Awaiting evaluation by Augustina Mood  for o/p mouth piece   Chronic lower back pain MRI of lumbar spine done in 2018 did not show spinal stenosis,   But her pain was not controlled with Celebrex and it raised her blood pressure.  Continue  use of vicodin once daily at bedtime only. Suspended  celebrex and continue tramadol for  daytime use.   Refill history confirmed via [place] Controlled Substance databas, accessed by me today.   Refills  Dated for  June , July and August, 2020  Educated About Covid-19 Virus Infection Educated patient on the newly broadened list of signs and symptoms of COVID-19 infection and ways to avoid the viral infection including washing hands frequently with soap and water,  using hand sanitizer if unable to wash, avoiding touching face,  staying at home and limiting visitors,  and avoiding contact with people coming in and out of home.  Discussed the potential ineffectiveness of hand sanitizer if left in environments > 110 degrees (ie , the car).  Reminded patient to call office with questions/concerns.  The importance of continued social distancing was discussed today . Patient was screened for the development of any unsafe behaviors or habits that may have developed as a result of the social impact of the virus , including alcohol abuse,  Domestic violence, tobacco abuse and overeating.       I discussed the assessment and treatment plan with the patient. The patient was provided an opportunity to ask questions and all were answered. The patient agreed with the plan and demonstrated an understanding of the instructions.   The patient was advised to call back or seek an in-person evaluation if the symptoms worsen or if the condition fails to improve as anticipated.  I provided 25 minutes of non-face-to-face time during this encounter.   Crecencio Mc, MD

## 2018-11-09 DIAGNOSIS — G2581 Restless legs syndrome: Secondary | ICD-10-CM | POA: Insufficient documentation

## 2018-11-09 DIAGNOSIS — Z7189 Other specified counseling: Secondary | ICD-10-CM | POA: Insufficient documentation

## 2018-11-09 NOTE — Assessment & Plan Note (Addendum)
With concurrent Iron deficiency managed wit IV infusions by Hematology , last iron check was July 2019,  Has  thalassemia. Has a strong FH of Parkinson;'s Disease and requesting testing.  FTF visit needed   Lab Results  Component Value Date   IRON 80 12/13/2017   TIBC 299 12/13/2017   FERRITIN 176 12/13/2017

## 2018-11-09 NOTE — Assessment & Plan Note (Signed)
MRI of lumbar spine done in 2018 did not show spinal stenosis,   But her pain was not controlled with Celebrex and it raised her blood pressure.  Continue  use of vicodin once daily at bedtime only. Suspended  celebrex and continue tramadol for  daytime use.   Refill history confirmed via [place] Controlled Substance databas, accessed by me today.   Refills  Dated for  June , July and August, 2020

## 2018-11-09 NOTE — Assessment & Plan Note (Signed)
Educated patient on the newly broadened list of signs and symptoms of COVID-19 infection and ways to avoid the viral infection including washing hands frequently with soap and water,  using hand sanitizer if unable to wash, avoiding touching face,  staying at home and limiting visitors,  and avoiding contact with people coming in and out of home.  Discussed the potential ineffectiveness of hand sanitizer if left in environments > 110 degrees (ie , the car).  Reminded patient to call office with questions/concerns.  The importance of continued social distancing was discussed today . Patient was screened for the development of any unsafe behaviors or habits that may have developed as a result of the social impact of the virus , including alcohol abuse,  Domestic violence, tobacco abuse and overeating.    

## 2018-11-09 NOTE — Assessment & Plan Note (Signed)
Untreated per patient preference.  Awaiting evaluation by Cheryl [name]  for o/p mouth piece

## 2018-12-09 ENCOUNTER — Other Ambulatory Visit: Payer: Self-pay | Admitting: Internal Medicine

## 2018-12-09 MED ORDER — ROPINIROLE HCL 0.25 MG PO TABS: 0.2500 mg | ORAL_TABLET | Freq: Every day | ORAL | 1 refills | Status: DC

## 2018-12-09 NOTE — Progress Notes (Signed)
.  uip

## 2018-12-10 ENCOUNTER — Other Ambulatory Visit: Payer: Self-pay | Admitting: Internal Medicine

## 2018-12-15 ENCOUNTER — Inpatient Hospital Stay: Payer: BC Managed Care – PPO | Attending: Internal Medicine

## 2018-12-19 ENCOUNTER — Telehealth: Payer: Self-pay | Admitting: *Deleted

## 2018-12-19 NOTE — Telephone Encounter (Signed)
rn spoke with md - [facility] orders completed and placed at check in desk in cancer ctr.

## 2018-12-19 NOTE — Telephone Encounter (Signed)
msg received from [facility] in cancer center. "pt forgot her lab and gets them at [facility] will pick up order if we can get it signed next Friday then will resch appt to following Friday"

## 2018-12-22 ENCOUNTER — Inpatient Hospital Stay: Payer: BC Managed Care – PPO | Admitting: Internal Medicine

## 2018-12-22 ENCOUNTER — Inpatient Hospital Stay: Payer: BC Managed Care – PPO

## 2018-12-25 ENCOUNTER — Other Ambulatory Visit: Payer: Self-pay

## 2018-12-26 ENCOUNTER — Other Ambulatory Visit: Payer: Self-pay | Admitting: *Deleted

## 2018-12-26 ENCOUNTER — Other Ambulatory Visit: Payer: Self-pay

## 2018-12-26 ENCOUNTER — Inpatient Hospital Stay: Payer: BC Managed Care – PPO | Attending: Internal Medicine

## 2018-12-26 DIAGNOSIS — D509 Iron deficiency anemia, unspecified: Secondary | ICD-10-CM

## 2018-12-26 LAB — COMPREHENSIVE METABOLIC PANEL
ALT: 14 U/L (ref 0–44)
AST: 17 U/L (ref 15–41)
Albumin: 4.6 g/dL (ref 3.5–5.0)
Alkaline Phosphatase: 80 U/L (ref 38–126)
Anion gap: 10 (ref 5–15)
BUN: 13 mg/dL (ref 8–23)
CO2: 22 mmol/L (ref 22–32)
Calcium: 9.2 mg/dL (ref 8.9–10.3)
Chloride: 106 mmol/L (ref 98–111)
Creatinine, Ser: 0.91 mg/dL (ref 0.44–1.00)
GFR calc Af Amer: >60 mL/min (ref >60)
GFR calc non Af Amer: >60 mL/min (ref >60)
Glucose, Bld: 91 mg/dL (ref 70–99)
Potassium: 4.5 mmol/L (ref 3.5–5.1)
Sodium: 138 mmol/L (ref 135–145)
Total Bilirubin: 1 mg/dL (ref 0.3–1.2)
Total Protein: 7.1 g/dL (ref 6.5–8.1)

## 2018-12-26 LAB — CBC WITH DIFFERENTIAL/PLATELET
Abs Immature Granulocytes: 0.06 10*3/uL (ref 0.00–0.07)
Basophils Absolute: 0.1 10*3/uL (ref 0.0–0.1)
Basophils Relative: 1 %
Eosinophils Absolute: 0.2 10*3/uL (ref 0.0–0.5)
Eosinophils Relative: 2 %
HCT: 32.3 % — ABNORMAL LOW (ref 36.0–46.0)
Hemoglobin: 10.1 g/dL — ABNORMAL LOW (ref 12.0–15.0)
Immature Granulocytes: 1 %
Lymphocytes Relative: 27 %
Lymphs Abs: 2.9 10*3/uL (ref 0.7–4.0)
MCH: 21.4 pg — ABNORMAL LOW (ref 26.0–34.0)
MCHC: 31.3 g/dL (ref 30.0–36.0)
MCV: 68.6 fL — ABNORMAL LOW (ref 80.0–100.0)
Monocytes Absolute: 0.9 10*3/uL (ref 0.1–1.0)
Monocytes Relative: 9 %
Neutro Abs: 6.5 10*3/uL (ref 1.7–7.7)
Neutrophils Relative %: 60 %
Platelets: 269 10*3/uL (ref 150–400)
RBC: 4.71 MIL/uL (ref 3.87–5.11)
RDW: 17.9 % — ABNORMAL HIGH (ref 11.5–15.5)
WBC: 10.6 10*3/uL — ABNORMAL HIGH (ref 4.0–10.5)
nRBC: 0.6 % — ABNORMAL HIGH (ref 0.0–0.2)

## 2018-12-26 LAB — IRON AND TIBC
Iron: 96 ug/dL (ref 28–170)
Saturation Ratios: 30 % (ref 10.4–31.8)
TIBC: 321 ug/dL (ref 250–450)
UIBC: 225 ug/dL

## 2018-12-26 LAB — VITAMIN B12: Vitamin B-12: 549 pg/mL (ref 180–914)

## 2018-12-26 LAB — FERRITIN: Ferritin: 229 ng/mL (ref 11–307)

## 2019-01-01 ENCOUNTER — Other Ambulatory Visit: Payer: Self-pay

## 2019-01-02 ENCOUNTER — Inpatient Hospital Stay: Payer: BC Managed Care – PPO

## 2019-01-02 ENCOUNTER — Other Ambulatory Visit: Payer: Self-pay

## 2019-01-02 ENCOUNTER — Encounter: Payer: Self-pay | Admitting: Internal Medicine

## 2019-01-02 ENCOUNTER — Inpatient Hospital Stay: Payer: BC Managed Care – PPO | Attending: Internal Medicine | Admitting: Internal Medicine

## 2019-01-02 DIAGNOSIS — I447 Left bundle-branch block, unspecified: Secondary | ICD-10-CM | POA: Insufficient documentation

## 2019-01-02 DIAGNOSIS — Z79899 Other long term (current) drug therapy: Secondary | ICD-10-CM | POA: Insufficient documentation

## 2019-01-02 DIAGNOSIS — R918 Other nonspecific abnormal finding of lung field: Secondary | ICD-10-CM | POA: Diagnosis not present

## 2019-01-02 DIAGNOSIS — Z87891 Personal history of nicotine dependence: Secondary | ICD-10-CM | POA: Insufficient documentation

## 2019-01-02 DIAGNOSIS — D509 Iron deficiency anemia, unspecified: Secondary | ICD-10-CM | POA: Diagnosis not present

## 2019-01-02 DIAGNOSIS — E079 Disorder of thyroid, unspecified: Secondary | ICD-10-CM | POA: Diagnosis not present

## 2019-01-02 DIAGNOSIS — E538 Deficiency of other specified B group vitamins: Secondary | ICD-10-CM | POA: Diagnosis not present

## 2019-01-02 DIAGNOSIS — Z801 Family history of malignant neoplasm of trachea, bronchus and lung: Secondary | ICD-10-CM | POA: Insufficient documentation

## 2019-01-02 DIAGNOSIS — D563 Thalassemia minor: Secondary | ICD-10-CM | POA: Insufficient documentation

## 2019-01-02 DIAGNOSIS — Z8673 Personal history of transient ischemic attack (TIA), and cerebral infarction without residual deficits: Secondary | ICD-10-CM | POA: Diagnosis not present

## 2019-01-02 DIAGNOSIS — R51 Headache: Secondary | ICD-10-CM | POA: Diagnosis not present

## 2019-01-02 DIAGNOSIS — N183 Chronic kidney disease, stage 3 (moderate): Secondary | ICD-10-CM | POA: Diagnosis not present

## 2019-01-02 DIAGNOSIS — Z803 Family history of malignant neoplasm of breast: Secondary | ICD-10-CM | POA: Insufficient documentation

## 2019-01-02 NOTE — Progress Notes (Signed)
Patient does not offer any problems today.  

## 2019-01-02 NOTE — Assessment & Plan Note (Signed)
#   Chronic microcytic anemia/history of beta thalassemia - out of proportion to her anemia. S/p IV venoferx4 in fall of 2017.  # Improved symptoms. Hb today 10.9;  Iron sat- 27%. Recommend continued dietary intervention.  No plans for IV iron at this time; STABLE.   # Lung nodules- incidental on scans in march 2017; March 2019- CT STABLE; benign.  # recheck labs in 12  months- 1 week prior; venofer IV possible. call with reuslts.   Cc; Dr.Tullo.

## 2019-01-02 NOTE — Progress Notes (Signed)
Cheryl [name], Cheryl [name], Cheryl [name], Cheryl PewJeffrey [name], Cheryl as Consulting Physician (General Surgery)  CHIEF COMPLAINTS/PURPOSE OF CONSULTATION:    HEMATOLOGY HISTORY  # CHRONIC MICROCYTIC ANEMIA- BETA THALASSEMIA MINOR [colo- 2016; Dr. Fanny SkatesByrnet] July 2017- IV trial of Venofer x4   # Lung nodules [CT- sub cm Jan 2017]-2019 CT scan stable.  Benign.  HISTORY OF PRESENTING ILLNESS:  Cheryl [name] 63 y.o.  female with a history of beta thalassemia minor; also iron deficiency anemia-is here for follow-up.  Patient denies any blood in stools or black or stools.  No nausea vomiting.  Her energy levels are adequate.  Review of Systems  Constitutional: Negative for chills, diaphoresis, fever, malaise/fatigue and weight loss.  HENT: Negative for nosebleeds and sore throat.   Eyes: Negative for double vision.  Respiratory: Negative for cough, hemoptysis, sputum production, shortness of breath and wheezing.   Cardiovascular: Negative for chest pain, palpitations, orthopnea and leg swelling.  Gastrointestinal: Negative for abdominal pain, blood in stool, constipation, diarrhea, heartburn, melena, nausea and vomiting.  Genitourinary: Negative for dysuria, frequency and urgency.  Musculoskeletal: Negative for back pain and joint pain.  Skin: Negative.  Negative for itching and rash.  Neurological: Negative for dizziness, tingling, focal weakness, weakness and headaches.  Endo/Heme/Allergies: Does not bruise/bleed easily.  Psychiatric/Behavioral: Negative for depression. The patient is not nervous/anxious and does not have insomnia.      MEDICAL HISTORY:  Past Medical History:  Diagnosis Date  . 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 Arrhythmia    left bundle branch block  .  Arthritis   . Blood in stool   . Chicken pox   . Generalized headaches   . History of blood transfusion   . Kidney disease, chronic, stage III (GFR 30-59 ml/min) (HCC)   . LBBB (left bundle branch block)   . Renal disorder   . Thyroid disease   . UTI (lower urinary tract infection)     SURGICAL HISTORY: Past Surgical History:  Procedure Laterality Date  . ABDOMINAL HYSTERECTOMY  1997  . APPENDECTOMY  1981  . BREAST EXCISIONAL BIOPSY Bilateral 1976   neg  . BREAST SURGERY  1976  . CARDIAC CATHETERIZATION  2004   [facility]  . CARDIAC CATHETERIZATION  2006   [facility]  . cystic fibrosis tumor removal  1983  . THUMB AMPUTATION  1992   traumatic  . TONSILLECTOMY AND ADENOIDECTOMY  1964    SOCIAL HISTORY: Social History   Socioeconomic History  . Marital status: Married    Spouse name: Not on file  . Number of children: Not on file  . Years of education: Not on file  . Highest education level: Not on file  Occupational History  . Not on file  Social Needs  . Financial resource strain: Not on file  . Food insecurity    Worry: Not on file    Inability: Not on file  . Transportation needs    Medical: Not on file    Non-medical: Not on file  Tobacco Use  . Smoking status: Former Smoker    Packs/day: 0.25    Years: 34.00    Pack years: 8.50    Types: Cigarettes    Quit date: 04/10/2013    Years since quitting: 5.7  . Smokeless tobacco: Never Used  Substance and Sexual Activity  . Alcohol use: Yes    Comment: occasional  . Drug use: No  . Sexual activity: Not on file  Lifestyle  . Physical activity    Days per week: Not on file    Minutes per session: Not on file  . Stress: Not on file  Relationships  . Social Musicianconnections    Talks on phone: Not on file    Gets together: Not on file    Attends religious service: Not on file    Active member of club or organization: Not on file    Attends meetings of clubs or organizations: Not on file    Relationship status: Not on  file  . Intimate partner violence    Fear of current or ex partner: Not on file    Emotionally abused: Not on file    Physically abused: Not on file    Forced sexual activity: Not on file  Other Topics Concern  . Not on file  Social History Narrative   Married to [employer]; Works for [employer], Engineer, structuraladministrative secretary. Quit smoking 2018; ocassional alcohol; [place]     FAMILY HISTORY: Family History  Problem Relation Age of Onset  . Heart disease Mother   . Arthritis Mother   . Cancer Mother        breast  . Hyperlipidemia Mother   . Hypertension Mother   . Heart attack Mother   . Breast cancer Mother 2544  . Heart disease Father   . Cancer Father        hodgkins disease, prostate  . Diabetes Father   . Arthritis Father   . Hypertension Father   . Heart disease Sister   . Diabetes Sister   . Heart disease Brother [phone]       ami x 8,  4 vessel CABG   . Diabetes Brother   . Liver disease Brother   . Lung disease Brother   . Heart disease Maternal Grandmother   . Diabetes Maternal Grandmother   . Heart disease Maternal Grandfather   . Heart disease Paternal Grandmother   . Diabetes Paternal Grandmother   . Stroke Paternal Grandfather   . Heart disease Paternal Grandfather   . Diabetes Paternal Grandfather   . Diabetes Maternal Aunt     ALLERGIES:  is allergic to peanuts [peanut oil]; penicillins; sulfa antibiotics; influenza vac split [flu virus vaccine]; mobic [meloxicam]; and nickel.  MEDICATIONS:  Current Outpatient Medications  Medication Sig Dispense Refill  . buPROPion (WELLBUTRIN SR) 100 MG 12 hr tablet 1 tablet 2 (two) times daily.    . Calcium Carbonate-Vit D-Min (CALCIUM 1200 PO) Take 1 tablet by mouth daily.    . Cyanocobalamin (VITAMIN B 12 PO) Take by mouth.    . cyclobenzaprine (FLEXERIL) 5 MG tablet 1-2 TABLETS AT BEDTIME, CAN BE INCREASED TO TWICE A DAY AS NEEDED FOR MUSCLE SPASM  2  . Docusate Calcium (STOOL SOFTENER PO) Take by mouth.     . EPINEPHrine (EPIPEN) 0.3 mg/0.3 mL DEVI Inject 0.3 mLs (0.3 mg total) into the muscle once. (Patient taking differently: Inject 0.3 mg into the muscle as needed. ) 1 Device 5  . gabapentin (NEURONTIN) 100 MG capsule TAKE 2 CAPSULES BY MOUTH AT BEDTIME 180 capsule 0  . gabapentin (NEURONTIN) 300 MG capsule TAKE 1 CAPSULE BY MOUTH  EVERY NIGHT AT BEDTIME 90 capsule 1  . [START ON 01/09/2019] HYDROcodone-acetaminophen (NORCO/VICODIN) 5-325 MG tablet Take 1 tablet by mouth  daily as needed for severe pain. 30 tablet 0  . losartan (COZAAR) 50 MG tablet Take 50 mg by mouth daily.    . nitroGLYCERIN (NITROSTAT) 0.4 MG SL tablet Place 1 tablet (0.4 mg total) under the tongue every 5 (five) minutes as needed for chest pain. 25 tablet 3  . progesterone (PROMETRIUM) 200 MG capsule Take 200 mg by mouth daily.    [name]. rOPINIRole (REQUIP) 0.25 MG tablet Take 1 tablet (0.25 mg total) by mouth at bedtime. 90 tablet 1  . rosuvastatin (CRESTOR) 10 MG tablet Take 1 tablet (10 mg total) by mouth daily. 90 tablet 11  . tiZANidine (ZANAFLEX) 2 MG tablet Take 1 tablet (2 mg total) by mouth every 6 (six) hours as needed for muscle spasms. 30 tablet 0  . traMADol (ULTRAM) 50 MG tablet TAKE 1 TABLET BY MOUTH TWICE DAILY AS NEEDED FOR PAIN 60 tablet 3  . venlafaxine XR (EFFEXOR-XR) 75 MG 24 hr capsule TAKE 1 CAPSULE(75 MG) BY MOUTH DAILY WITH BREAKFAST 90 capsule 3  . vitamin C (ASCORBIC ACID) 500 MG tablet Take 500 mg by mouth daily.    [name]. albuterol (PROVENTIL HFA;VENTOLIN HFA) 108 (90 Base) MCG/ACT inhaler Inhale 2 puffs into the lungs every 6 (six) hours as needed for wheezing or shortness of breath. (Patient not taking: Reported on 11/07/2018) 1 Inhaler 0  . HYDROcodone-acetaminophen (NORCO/VICODIN) 5-325 MG tablet Take 1 tablet by mouth daily as needed. (Patient not taking: Reported on 01/02/2019) 30 tablet 0  . HYDROcodone-acetaminophen (NORCO/VICODIN) 5-325 MG tablet Take 1 tablet by mouth daily as needed. (Patient not  taking: Reported on 01/02/2019) 30 tablet 0  . hydrOXYzine (ATARAX/VISTARIL) 25 MG tablet TAKE 1 TABLET (25 MG TOTAL) BY MOUTH 3 (THREE) TIMES DAILY AS NEEDED FOR ITCHING. (Patient not taking: Reported on 11/03/2018) 90 tablet 3   No current facility-administered medications for this visit.       [name].  PHYSICAL EXAMINATION:   Vitals:   01/02/19 1258  BP: 130/74  Pulse: 86  Resp: 18  Temp: (!) 97.3 F (36.3 C)   Filed Weights   01/02/19 1258  Weight: 162 lb 6.4 oz (73.7 kg)    GENERAL: Well-nourished well-developed; Alert, no distress and comfortable.  Alone. EYES: no pallor or icterus OROPHARYNX: no thrush or ulceration; NECK: supple; no lymph nodes felt. LYMPH:  no palpable lymphadenopathy in the axillary or inguinal regions LUNGS: Decreased breath sounds auscultation bilaterally. No wheeze or crackles HEART/CVS: regular rate & rhythm and no murmurs; No lower extremity edema ABDOMEN:abdomen soft, non-tender and normal bowel sounds. No hepatomegaly or splenomegaly.  Musculoskeletal:no cyanosis of digits and no clubbing  PSYCH: alert & oriented x 3 with fluent speech NEURO: no focal motor/sensory deficits SKIN:  no rashes or significant lesions  LABORATORY DATA:  I have reviewed the data as listed Lab Results  Component Value Date   WBC 10.6 (H) 12/26/2018   HGB 10.1 ([name]) 12/26/2018   HCT 32.3 ([name]) 12/26/2018   MCV 68.6 ([name]) 12/26/2018   PLT 269 12/26/2018   Recent Labs    06/13/18 0829 12/26/18 1146  NA 139 138  K 4.6 4.5  CL 102 106  CO2 23 22  GLUCOSE 80 91  BUN 12 13  CREATININE 0.90 0.91  CALCIUM 9.3 9.2  GFRNONAA 69 >60  GFRAA 79 >60  PROT 6.2 7.1  ALBUMIN 4.3 4.6  AST 15 17  ALT 11 14  ALKPHOS 83 80  BILITOT 0.9 1.0     No  results found.  ASSESSMENT & PLAN:   Microcytic anemia # Chronic microcytic anemia/history of beta thalassemia - out of proportion to her anemia. S/p IV venoferx4 in fall of 2017.  # Improved symptoms. Hb today 10.9;  Iron  sat- 27%. Recommend continued dietary intervention.  No plans for IV iron at this time; STABLE.   # Lung nodules- incidental on scans in march 2017; March 2019- CT STABLE; benign.  # recheck labs in 12  months- 1 week prior; venofer IV possible. call with reuslts.   Cc; Dr.Tullo.   Thalassemia minor # Chronic microcytic anemia/history of beta thalassemia - out of proportion to her anemia. S/p IV venoferx4 in fall of 2017.  # Improved symptoms. Hb today 10.1;  Iron sat- 30%. Recommend continued dietary intervention.  STABLE.  No Venofer.  # B12 deficiency - on PO B12.   # DISPOSITION: # NO Infusions today  # 12  months-Cheryl-labs-cbc/bmp/iron studies/ferritin/B12- 1 week prior; venofer IV possible.   Cc; Dr.Tullo.    Cammie Sickle, Cheryl 01/06/2019 2:46 PM

## 2019-01-02 NOTE — Assessment & Plan Note (Addendum)
#   Chronic microcytic anemia/history of beta thalassemia - out of proportion to her anemia. S/p IV venoferx4 in fall of 2017.  # Improved symptoms. Hb today 10.1;  Iron sat- 30%. Recommend continued dietary intervention.  STABLE.  No Venofer.  # B12 deficiency - on PO B12.   # DISPOSITION: # NO Infusions today  # 12  months-MD-labs-cbc/bmp/iron studies/ferritin/B12- 1 week prior; venofer IV possible.   Cc; Dr.Tullo.

## 2019-01-07 ENCOUNTER — Telehealth: Payer: Self-pay

## 2019-01-07 NOTE — Telephone Encounter (Signed)
Copied from [place] [phone]. Topic: General - Other >> Jan 07, 2019  2:22 PM Cheryl [name] A wrote: Reason for CRM: Patient called to say that she will be keeping her appointment on 01/12/2019 as per message she received.

## 2019-01-11 IMAGING — MR MR CERVICAL SPINE W/O CM
4 of 5 series · 24 of 48 positions shown · non-contrast
Comparison: Cervical spine radiographs 08/13/2017. MRI of the brain
06/06/2017

CLINICAL DATA: Cervical radiculopathy. Neck pain. Left upper
extremity weakness and numbness.

EXAM:
MRI CERVICAL SPINE WITHOUT CONTRAST
TECHNIQUE: Multiplanar, multisequence MR imaging of the cervical spine was
performed. No intravenous contrast was administered.

[Series 3: T2 post-contrast · sagittal · 3.3mm · 0.37mm/px · 6 of 13 slices shown]
[im 1/13]
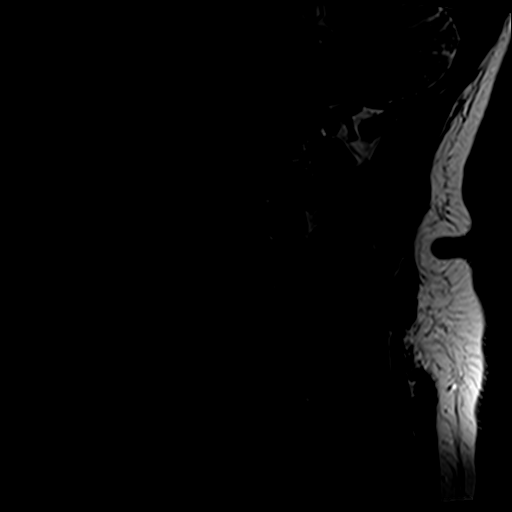
[im 3/13]
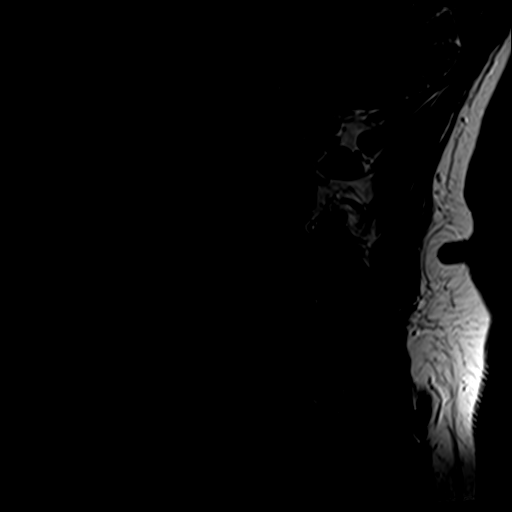
[im 5/13]
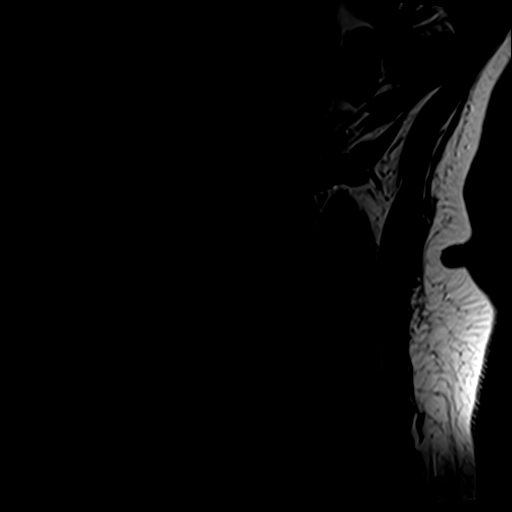
[im 8/13]
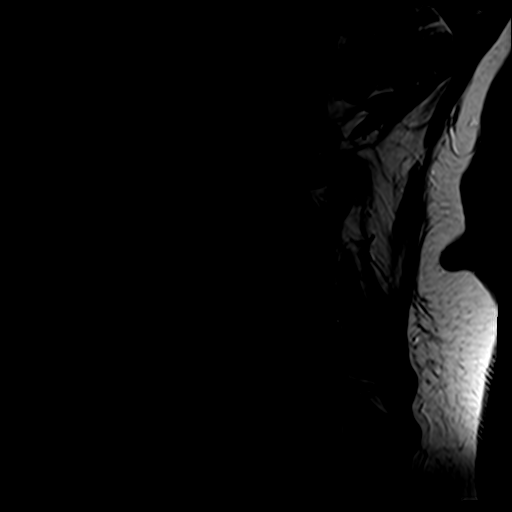
[im 10/13]
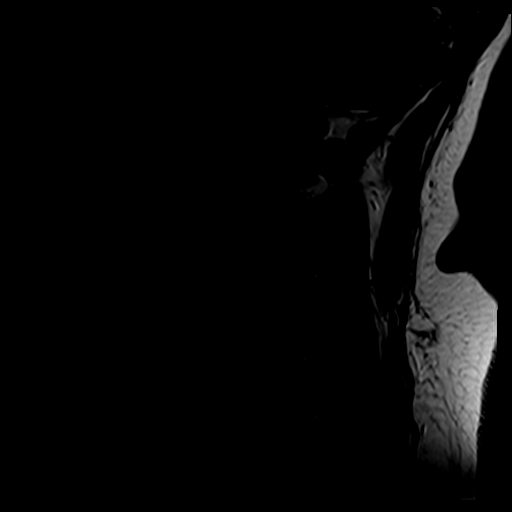
[im 13/13]
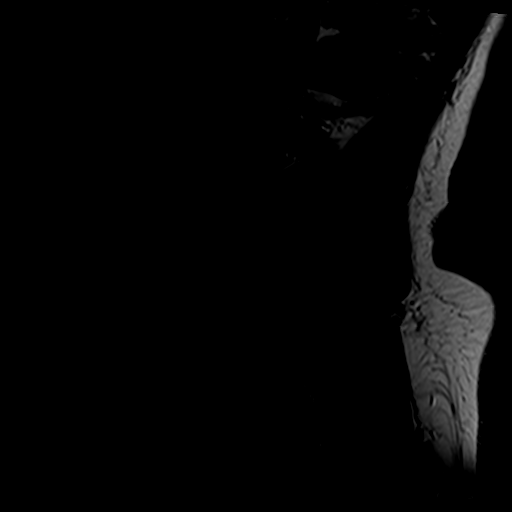

[Series 4: T1 · sagittal · 3.3mm · 0.37mm/px · 7 of 13 slices shown]
[im 1/13]
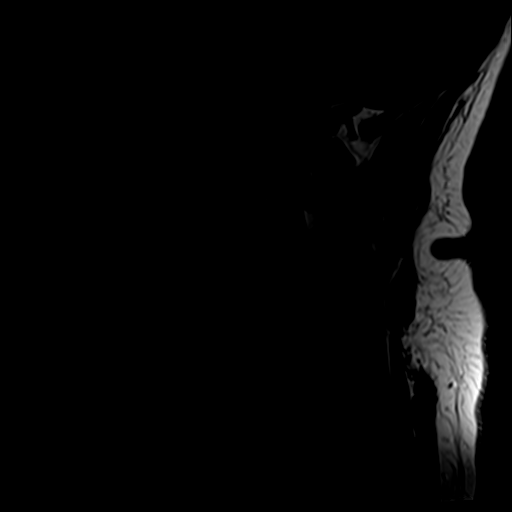
[im 3/13]
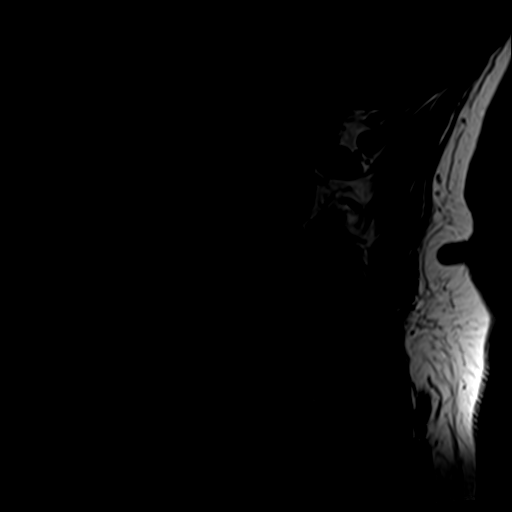
[im 5/13]
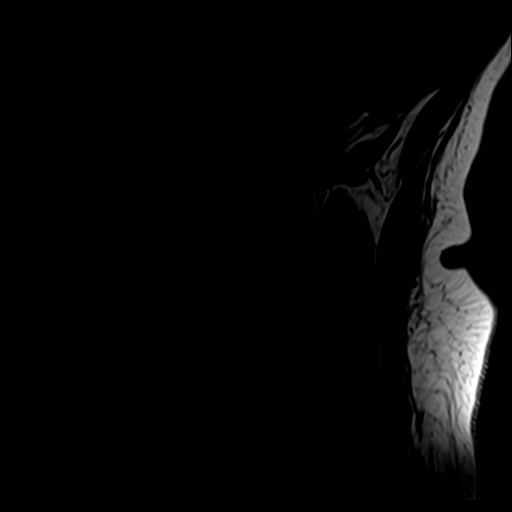
[im 7/13]
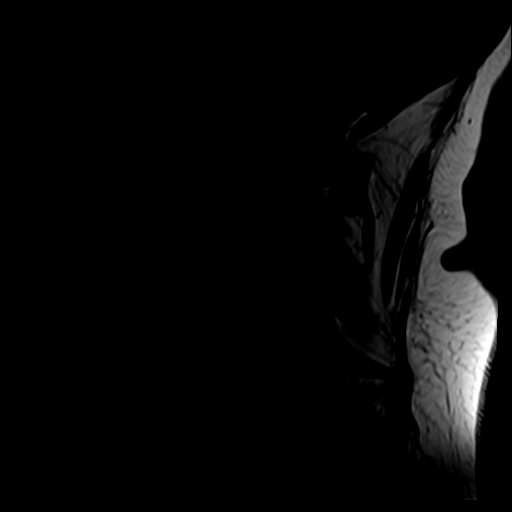
[im 9/13]
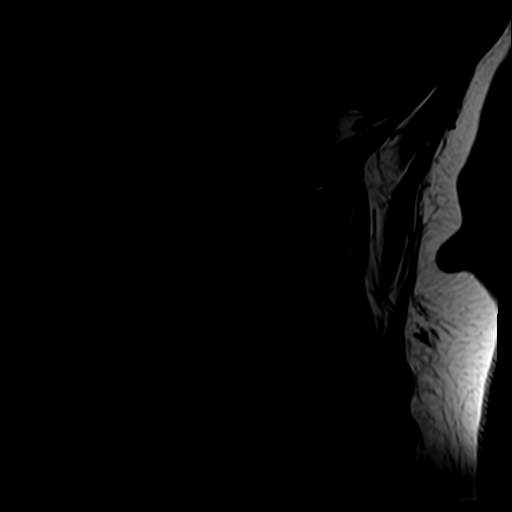
[im 11/13]
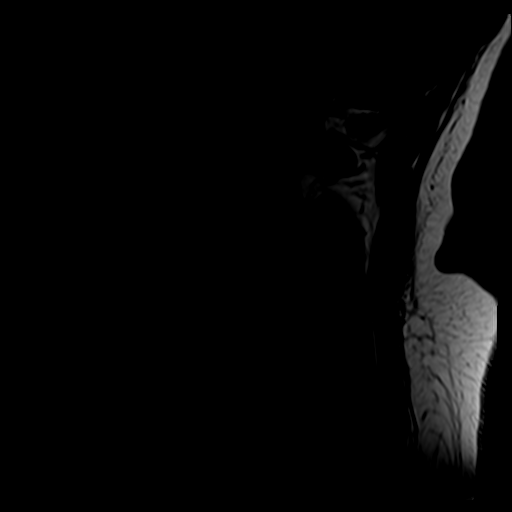
[im 13/13]
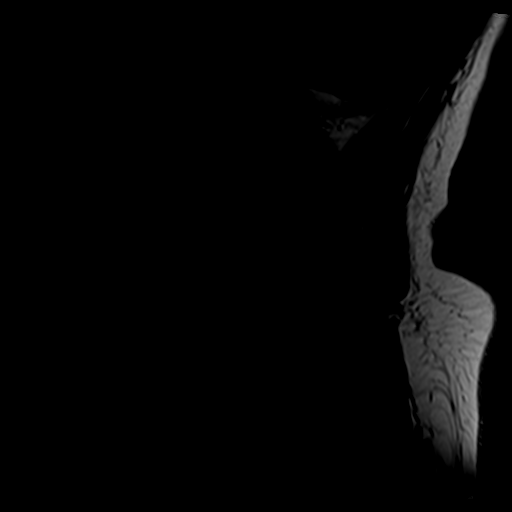

[Series 5: tir sag · sagittal · 3.3mm · 0.37mm/px · 3 of 13 slices shown]
[im 3/13]
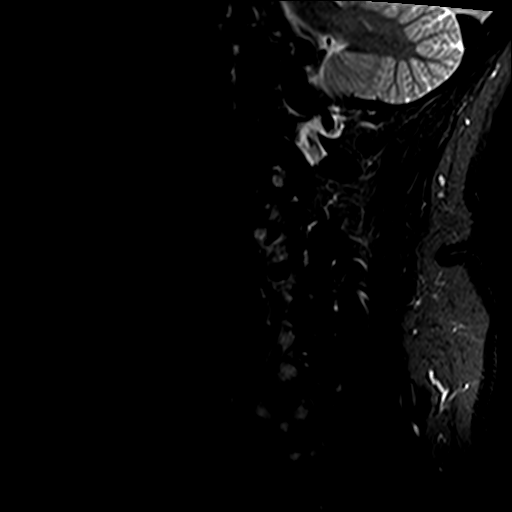
[im 7/13]
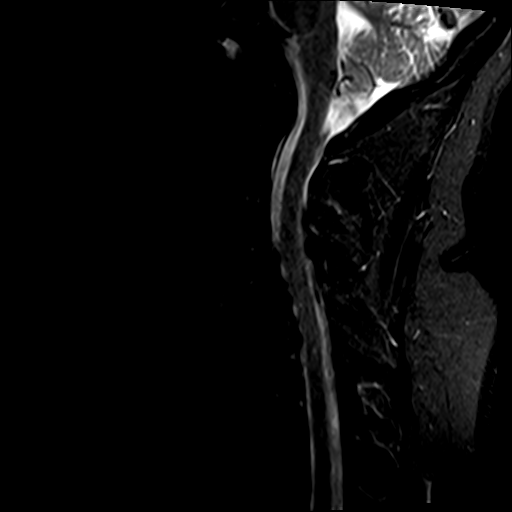
[im 11/13]
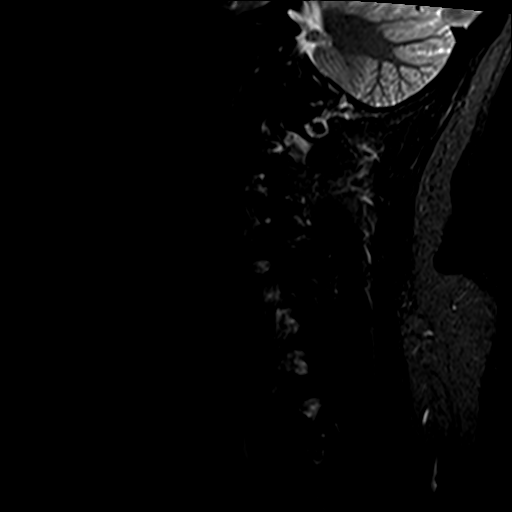

[Series 7: T2 · axial · 3.0mm · 0.70mm/px · z∈[-45,+46]mm · 8 of 26 slices shown]
[im 1/26]
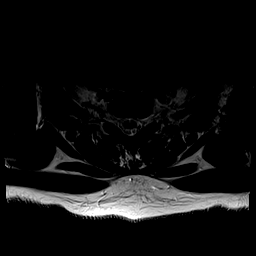
[im 4/26]
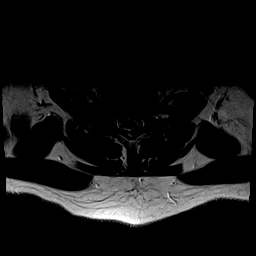
[im 8/26]
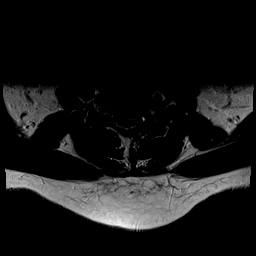
[im 12/26]
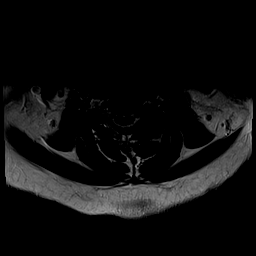
[im 14/26]
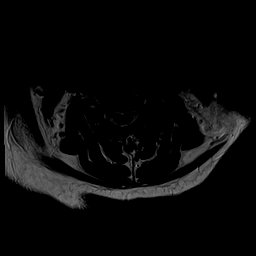
[im 18/26]
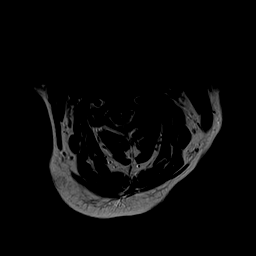
[im 22/26]
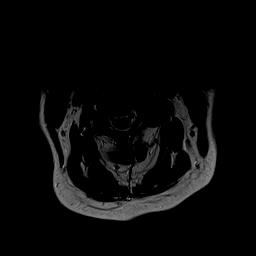
[im 26/26]
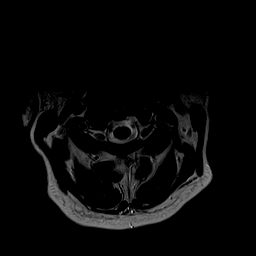

[24 of 48 positions shown; findings below may reference images not displayed]

FINDINGS: Alignment: Grade 1 anterolisthesis is present at C4-5 and C5-6. AP
alignment is otherwise anatomic. There is some straightening of the
normal cervical lordosis.

Vertebrae: Heterogeneous marrow signal is within normal limits for
age. No discrete lesions are present.

Cord: Normal signal is present in the cervical and upper thoracic
spinal cord to the lowest imaged level, T2-3.

Posterior Fossa, vertebral arteries, paraspinal tissues: The
craniocervical junction is normal. The visualized intracranial
contents are normal. There is some fluid in the right sphenoid
sinus.

Disc levels:

C1-2: Patent.

C2-3: Asymmetric left-sided facet spurring is present without
significant stenosis. There is some edema within the facet joints.

C3-4: Asymmetric left-sided facet spurring is present. There is some
edema within the facet joints as well. No significant stenosis is
present.

C4-5: Asymmetric left-sided facet hypertrophy is present. Mild left
foraminal narrowing is present. The central canal and right foramen
are patent.

C5-6: No significant focal disc protrusion or stenosis is present.

C6-7: The central canal and foramina are patent. Mild uncovertebral
spurring is present without stenosis.

C7-T1: Negative.
IMPRESSION: 1. Asymmetric left-sided facet arthropathy at C2-3 and C3-4 with
some edema in both joints suggesting acute on chronic inflammatory
change.
2. No associated foraminal narrowing at these levels.
3. Mild left foraminal narrowing due to uncovertebral and facet
hypertrophy at C4-5.
4. Mild uncovertebral spurring at C6-7 without significant stenosis.

## 2019-01-12 ENCOUNTER — Encounter: Payer: Self-pay | Admitting: Internal Medicine

## 2019-01-12 ENCOUNTER — Ambulatory Visit (INDEPENDENT_AMBULATORY_CARE_PROVIDER_SITE_OTHER): Payer: BC Managed Care – PPO | Admitting: Internal Medicine

## 2019-01-12 DIAGNOSIS — M545 Low back pain, unspecified: Secondary | ICD-10-CM

## 2019-01-12 DIAGNOSIS — D563 Thalassemia minor: Secondary | ICD-10-CM | POA: Diagnosis not present

## 2019-01-12 DIAGNOSIS — G8929 Other chronic pain: Secondary | ICD-10-CM

## 2019-01-12 DIAGNOSIS — G2581 Restless legs syndrome: Secondary | ICD-10-CM

## 2019-01-12 DIAGNOSIS — M19011 Primary osteoarthritis, right shoulder: Secondary | ICD-10-CM | POA: Diagnosis not present

## 2019-01-12 DIAGNOSIS — M19012 Primary osteoarthritis, left shoulder: Secondary | ICD-10-CM

## 2019-01-12 MED ORDER — TRAMADOL HCL 50 MG PO TABS: 50.0000 mg | ORAL_TABLET | Freq: Two times a day (BID) | ORAL | 3 refills | Status: DC | PRN

## 2019-01-12 MED ORDER — HYDROCODONE-ACETAMINOPHEN 5-325 MG PO TABS: 1.0000 | ORAL_TABLET | Freq: Every day | ORAL | 0 refills | Status: DC | PRN

## 2019-01-12 MED ORDER — ROPINIROLE HCL 0.25 MG PO TABS: ORAL_TABLET | ORAL | 1 refills | Status: DC

## 2019-01-12 NOTE — Progress Notes (Addendum)
Virtual Visit converted toTelephone Note  This visit type was conducted due to national recommendations for restrictions regarding the COVID-19 pandemic (e.g. social distancing).  This format is felt to be most appropriate for this patient at this time.  All issues noted in this document were discussed and addressed.  No physical exam was performed (except for noted visual exam findings with Video Visits).   I connected with@ on 01/12/19 at  4:30 PM EDT by a video enabled telemedicine applicationand verified that I am speaking with the correct person using two identifiers. Location patient: home Location provider: work or home office Persons participating in the virtual visit: patient, provider  Interactive audio and video telecommunications were attempted between this provider and patient, however failed, due to patient having technical difficulties OR patient did not have access to video capability.  We continued and completed visit with audio only.    I discussed the limitations, risks, security and privacy concerns of performing an evaluation and management service by telephone and the availability of in person appointments. I also discussed with the patient that there may be a patient responsible charge related to this service. The patient expressed understanding and agreed to proceed.  Reason for visit: follow up on RLS with trial of Requip   HPI:  Follow up on restless legs .  She was started on therapy with Requip at 0.25 mg daily at bedtime in mid June.  She states that the  medication is working well .  She has  occasionally had to take a second dose,  But has no symptoms during the day .  She has a paternal history of Parkinson's Disease  But denies any personal history of tremor ,  Gait disorder or impaired balance.    Chronic pain:  She continues to use hydrocodone once daily for management of non surgical back pain  Refill history confirmed via [place] Controlled Substance databas,  accessed by me today.  She has been working from home since the pandemic closed the [place] offices.  The patient has no signs or symptoms of COVID 19 infection (fever, cough, sore throat  or shortness of breath beyond what is typical for patient).  Patient denies contact with other persons with the above mentioned symptoms or with anyone confirmed to have COVID 19 .[name]  ROS: See pertinent positives and negatives per HPI.  Past Medical History:  Diagnosis Date  . 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 Arrhythmia    left bundle branch block  . Arthritis   . Blood in stool   . Chicken pox   . Generalized headaches   . History of blood transfusion   . Kidney disease, chronic, stage III (GFR 30-59 ml/min) (HCC)   . LBBB (left bundle branch block)   . Renal disorder   . Thyroid disease   . UTI (lower urinary tract infection)     Past Surgical History:  Procedure Laterality Date  . ABDOMINAL HYSTERECTOMY  1997  . APPENDECTOMY  1981  . BREAST EXCISIONAL BIOPSY Bilateral 1976   neg  . BREAST SURGERY  1976  . CARDIAC CATHETERIZATION  2004   [facility]  . CARDIAC CATHETERIZATION  2006   [facility]  . cystic fibrosis tumor removal  1983  . THUMB AMPUTATION  1992   traumatic  . TONSILLECTOMY AND ADENOIDECTOMY  1964    Family History  Problem Relation  Age of Onset  . Heart disease Mother   . Arthritis Mother   . Cancer Mother        breast  . Hyperlipidemia Mother   . Hypertension Mother   . Heart attack Mother   . Breast cancer Mother [id number]  . Heart disease Father   . Diabetes Father   . Arthritis Father   . Hypertension Father   . Parkinson's disease Father   . Hodgkin's lymphoma Father        hodgkins disease, prostate  . Heart disease Sister   . Diabetes Sister   . Heart disease Brother [id number]       ami x 8,  4 vessel CABG   . Diabetes Brother   .  Liver disease Brother   . Lung disease Brother   . Heart disease Maternal Grandmother   . Diabetes Maternal Grandmother   . Heart disease Maternal Grandfather   . Heart disease Paternal Grandmother   . Diabetes Paternal Grandmother   . Stroke Paternal Grandfather   . Heart disease Paternal Grandfather   . Diabetes Paternal Grandfather   . Diabetes Maternal Aunt     SOCIAL HX:  Social History   Socioeconomic History  . Marital status: Married    Spouse name: Not on file  . Number of children: Not on file  . Years of education: Not on file  . Highest education level: Not on file  Occupational History  . Not on file  Social Needs  . Financial resource strain: Not on file  . Food insecurity    Worry: Not on file    Inability: Not on file  . Transportation needs    Medical: Not on file    Non-medical: Not on file  Tobacco Use  . Smoking status: Former Smoker    Packs/day: 0.25    Years: 34.00    Pack years: 8.50    Types: Cigarettes    Quit date: 04/10/2013    Years since quitting: 5.7  . Smokeless tobacco: Never Used  Substance and Sexual Activity  . Alcohol use: Yes    Comment: occasional  . Drug use: No  . Sexual activity: Not on file  Lifestyle  . Physical activity    Days per week: Not on file    Minutes per session: Not on file  . Stress: Not on file  Relationships  . Social Musicianconnections    Talks on phone: Not on file    Gets together: Not on file    Attends religious service: Not on file    Active member of club or organization: Not on file    Attends meetings of clubs or organizations: Not on file    Relationship status: Not on file  . Intimate partner violence    Fear of current or ex partner: Not on file    Emotionally abused: Not on file    Physically abused: Not on file    Forced sexual activity: Not on file  Other Topics Concern  . Not on file  Social History Narrative   Married to [employer] [place]; Works for [employer], Engineer, structuraladministrative secretary. Quit  smoking 2018; ocassional alcohol; lives near to [place].       Current Outpatient Medications:  .  albuterol (PROVENTIL HFA;VENTOLIN HFA) 108 (90 Base) MCG/ACT inhaler, Inhale 2 puffs into the lungs every 6 (six) hours as needed for wheezing or shortness of breath., Disp: 1 Inhaler, Rfl: 0 .  buPROPion (WELLBUTRIN SR) 100 MG 12 hr  tablet, 1 tablet 2 (two) times daily., Disp: , Rfl:  .  Calcium Carbonate-Vit D-Min (CALCIUM 1200 PO), Take 1 tablet by mouth daily., Disp: , Rfl:  .  Cyanocobalamin (VITAMIN B 12 PO), Take by mouth., Disp: , Rfl:  .  cyclobenzaprine (FLEXERIL) 5 MG tablet, 1-2 TABLETS AT BEDTIME, CAN BE INCREASED TO TWICE A DAY AS NEEDED FOR MUSCLE SPASM, Disp: , Rfl: 2 .  Docusate Calcium (STOOL SOFTENER PO), Take by mouth., Disp: , Rfl:  .  EPINEPHrine (EPIPEN) 0.3 mg/0.3 mL DEVI, Inject 0.3 mLs (0.3 mg total) into the muscle once. (Patient taking differently: Inject 0.3 mg into the muscle as needed. ), Disp: 1 Device, Rfl: 5 .  gabapentin (NEURONTIN) 100 MG capsule, TAKE 2 CAPSULES BY MOUTH AT BEDTIME, Disp: 180 capsule, Rfl: 0 .  gabapentin (NEURONTIN) 300 MG capsule, TAKE 1 CAPSULE BY MOUTH  EVERY NIGHT AT BEDTIME, Disp: 90 capsule, Rfl: 1 .  HYDROcodone-acetaminophen (NORCO/VICODIN) 5-325 MG tablet, Take 1 tablet by mouth daily as needed for severe pain., Disp: 30 tablet, Rfl: 0 .  [START ON 03/10/2019] HYDROcodone-acetaminophen (NORCO/VICODIN) 5-325 MG tablet, Take 1 tablet by mouth daily as needed., Disp: 30 tablet, Rfl: 0 .  HYDROcodone-acetaminophen (NORCO/VICODIN) 5-325 MG tablet, Take 1 tablet by mouth daily as needed., Disp: 30 tablet, Rfl: 0 .  hydrOXYzine (ATARAX/VISTARIL) 25 MG tablet, TAKE 1 TABLET (25 MG TOTAL) BY MOUTH 3 (THREE) TIMES DAILY AS NEEDED FOR ITCHING., Disp: 90 tablet, Rfl: 3 .  losartan (COZAAR) 50 MG tablet, Take 50 mg by mouth daily., Disp: , Rfl:  .  nitroGLYCERIN (NITROSTAT) 0.4 MG SL tablet, Place 1 tablet (0.4 mg total) under the tongue every 5  (five) minutes as needed for chest pain., Disp: 25 tablet, Rfl: 3 .  progesterone (PROMETRIUM) 200 MG capsule, Take 200 mg by mouth daily., Disp: , Rfl:  .  rOPINIRole (REQUIP) 0.25 MG tablet, 1-2 tablets at bedtime for restless legs, Disp: 180 tablet, Rfl: 1 .  rosuvastatin (CRESTOR) 10 MG tablet, Take 1 tablet (10 mg total) by mouth daily., Disp: 90 tablet, Rfl: 11 .  tiZANidine (ZANAFLEX) 2 MG tablet, Take 1 tablet (2 mg total) by mouth every 6 (six) hours as needed for muscle spasms., Disp: 30 tablet, Rfl: 0 .  traMADol (ULTRAM) 50 MG tablet, Take 1 tablet (50 mg total) by mouth 2 (two) times daily as needed. for pain, Disp: 60 tablet, Rfl: 3 .  venlafaxine XR (EFFEXOR-XR) 75 MG 24 hr capsule, TAKE 1 CAPSULE(75 MG) BY MOUTH DAILY WITH BREAKFAST, Disp: 90 capsule, Rfl: 3 .  vitamin C (ASCORBIC ACID) 500 MG tablet, Take 500 mg by mouth daily., Disp: , Rfl:   EXAM: based on visual cues obtained prior to loss of visual properties and conversion to telephone visit   VITALS per patient if applicable:BP 130/74   Pulse 86   Temp 98.2 F (36.8 C)   Wt 162 lb (73.5 kg)   BMI 28.70 kg/m   GENERAL: alert, oriented, appears well and in no acute distress. Ambulating well   HEENT: atraumatic, conjunttiva clear, no obvious abnormalities on inspection of external nose and ears  NECK: normal movements of the head and neck  LUNGS: on inspection no signs of respiratory distress, breathing rate appears normal, no obvious gross SOB, gasping or wheezing  CV: no obvious cyanosis  MS: moves all visible extremities without noticeable abnormality  PSYCH/NEURO: pleasant and cooperative, no obvious depression or anxiety, speech and thought processing grossly intact  ASSESSMENT  AND PLAN:  Restless legs Symptoms occurring at night only.  Managed with requip,  Refilling at #60/month due to occasional need for double dose.   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  Continue  use of vicodin once daily at bedtime only. Suspended  celebrex and continue tramadol for  daytime use.   Refill history confirmed via [place] Controlled Substance databas, accessed by me today.   Refills  Dated for  [place], October and November , 2020    I discussed the assessment and treatment plan with the patient. The patient was provided an opportunity to ask questions and all were answered. The patient agreed with the plan and demonstrated an understanding of the instructions.   The patient was advised to call back or seek an in-person evaluation if the symptoms worsen or if the condition fails to improve as anticipated.  I provided 25 minutes of non-face-to-face time during this encounter.   Sherlene Shamseresa L Sinai Mahany, MD

## 2019-01-13 ENCOUNTER — Encounter: Payer: Self-pay | Admitting: Internal Medicine

## 2019-01-13 NOTE — Assessment & Plan Note (Signed)
MRI of lumbar spine done in 2018 did not show spinal stenosis,   But her pain was not controlled with Celebrex and it raised her blood pressure.  Continue  use of vicodin once daily at bedtime only. Suspended  celebrex and continue tramadol for  daytime use.   Refill history confirmed via [place] Controlled Substance databas, accessed by me today.   Refills  Dated for  [place], October and November , 2020

## 2019-01-13 NOTE — Assessment & Plan Note (Signed)
Symptoms occurring at night only.  Managed with requip,  Refilling at #60/month due to occasional need for double dose.

## 2019-01-13 NOTE — Assessment & Plan Note (Signed)
Managed by hematology  Lab Results  Component Value Date   WBC 10.6 (H) 12/26/2018   HGB 10.1 (L) 12/26/2018   HCT 32.3 (L) 12/26/2018   MCV 68.6 (L) 12/26/2018   PLT 269 12/26/2018

## 2019-01-20 ENCOUNTER — Other Ambulatory Visit: Payer: Self-pay | Admitting: Family Medicine

## 2019-01-20 ENCOUNTER — Other Ambulatory Visit: Payer: Self-pay | Admitting: Internal Medicine

## 2019-01-20 NOTE — Telephone Encounter (Signed)
Looks like this medication was last filled by a historical provider about 2 weeks ago.

## 2019-01-22 ENCOUNTER — Ambulatory Visit: Payer: BC Managed Care – PPO | Admitting: Family Medicine

## 2019-01-28 ENCOUNTER — Other Ambulatory Visit: Payer: Self-pay

## 2019-01-28 MED ORDER — ROSUVASTATIN CALCIUM 10 MG PO TABS: 10.0000 mg | ORAL_TABLET | Freq: Every day | ORAL | 3 refills | Status: DC

## 2019-01-28 MED ORDER — VENLAFAXINE HCL ER 75 MG PO CP24: ORAL_CAPSULE | ORAL | 3 refills | Status: DC

## 2019-01-28 MED ORDER — ROPINIROLE HCL 0.25 MG PO TABS: ORAL_TABLET | ORAL | 1 refills | Status: DC

## 2019-01-29 ENCOUNTER — Encounter: Payer: Self-pay | Admitting: Emergency Medicine

## 2019-01-29 ENCOUNTER — Observation Stay
Admission: EM | Admit: 2019-01-29 | Discharge: 2019-01-30 | Disposition: A | Discharge status: Home / Self Care | Payer: BC Managed Care – PPO | Attending: Internal Medicine | Admitting: Internal Medicine

## 2019-01-29 ENCOUNTER — Other Ambulatory Visit: Payer: Self-pay

## 2019-01-29 ENCOUNTER — Emergency Department: Payer: BC Managed Care – PPO

## 2019-01-29 DIAGNOSIS — Z880 Allergy status to penicillin: Secondary | ICD-10-CM | POA: Diagnosis not present

## 2019-01-29 DIAGNOSIS — E785 Hyperlipidemia, unspecified: Secondary | ICD-10-CM | POA: Insufficient documentation

## 2019-01-29 DIAGNOSIS — Z8249 Family history of ischemic heart disease and other diseases of the circulatory system: Secondary | ICD-10-CM | POA: Diagnosis not present

## 2019-01-29 DIAGNOSIS — Z888 Allergy status to other drugs, medicaments and biological substances status: Secondary | ICD-10-CM | POA: Diagnosis not present

## 2019-01-29 DIAGNOSIS — F79 Unspecified intellectual disabilities: Secondary | ICD-10-CM | POA: Insufficient documentation

## 2019-01-29 DIAGNOSIS — N183 Chronic kidney disease, stage 3 (moderate): Secondary | ICD-10-CM | POA: Insufficient documentation

## 2019-01-29 DIAGNOSIS — I447 Left bundle-branch block, unspecified: Secondary | ICD-10-CM | POA: Diagnosis not present

## 2019-01-29 DIAGNOSIS — R0602 Shortness of breath: Secondary | ICD-10-CM | POA: Diagnosis not present

## 2019-01-29 DIAGNOSIS — R002 Palpitations: Secondary | ICD-10-CM | POA: Diagnosis not present

## 2019-01-29 DIAGNOSIS — M19012 Primary osteoarthritis, left shoulder: Secondary | ICD-10-CM | POA: Diagnosis not present

## 2019-01-29 DIAGNOSIS — Z882 Allergy status to sulfonamides status: Secondary | ICD-10-CM | POA: Diagnosis not present

## 2019-01-29 DIAGNOSIS — I701 Atherosclerosis of renal artery: Secondary | ICD-10-CM | POA: Diagnosis not present

## 2019-01-29 DIAGNOSIS — Z79899 Other long term (current) drug therapy: Secondary | ICD-10-CM | POA: Diagnosis not present

## 2019-01-29 DIAGNOSIS — M19011 Primary osteoarthritis, right shoulder: Secondary | ICD-10-CM | POA: Diagnosis not present

## 2019-01-29 DIAGNOSIS — I2510 Atherosclerotic heart disease of native coronary artery without angina pectoris: Secondary | ICD-10-CM | POA: Diagnosis present

## 2019-01-29 DIAGNOSIS — Z720 Tobacco use: Secondary | ICD-10-CM | POA: Diagnosis not present

## 2019-01-29 DIAGNOSIS — Z20828 Contact with and (suspected) exposure to other viral communicable diseases: Secondary | ICD-10-CM | POA: Insufficient documentation

## 2019-01-29 DIAGNOSIS — K589 Irritable bowel syndrome without diarrhea: Secondary | ICD-10-CM | POA: Diagnosis not present

## 2019-01-29 DIAGNOSIS — R0789 Other chest pain: Secondary | ICD-10-CM | POA: Diagnosis not present

## 2019-01-29 DIAGNOSIS — D649 Anemia, unspecified: Secondary | ICD-10-CM | POA: Diagnosis not present

## 2019-01-29 DIAGNOSIS — G4733 Obstructive sleep apnea (adult) (pediatric): Secondary | ICD-10-CM | POA: Insufficient documentation

## 2019-01-29 DIAGNOSIS — Z716 Tobacco abuse counseling: Secondary | ICD-10-CM | POA: Insufficient documentation

## 2019-01-29 DIAGNOSIS — I739 Peripheral vascular disease, unspecified: Secondary | ICD-10-CM | POA: Diagnosis not present

## 2019-01-29 DIAGNOSIS — Z0184 Encounter for antibody response examination: Secondary | ICD-10-CM | POA: Insufficient documentation

## 2019-01-29 DIAGNOSIS — F17210 Nicotine dependence, cigarettes, uncomplicated: Secondary | ICD-10-CM | POA: Diagnosis not present

## 2019-01-29 DIAGNOSIS — D563 Thalassemia minor: Secondary | ICD-10-CM | POA: Insufficient documentation

## 2019-01-29 DIAGNOSIS — R079 Chest pain, unspecified: Secondary | ICD-10-CM | POA: Diagnosis not present

## 2019-01-29 DIAGNOSIS — E079 Disorder of thyroid, unspecified: Secondary | ICD-10-CM | POA: Insufficient documentation

## 2019-01-29 LAB — BASIC METABOLIC PANEL
Anion gap: 8 (ref 5–15)
BUN: 12 mg/dL (ref 8–23)
CO2: 24 mmol/L (ref 22–32)
Calcium: 9.1 mg/dL (ref 8.9–10.3)
Chloride: 107 mmol/L (ref 98–111)
Creatinine, Ser: 0.96 mg/dL (ref 0.44–1.00)
GFR calc Af Amer: >60 mL/min (ref >60)
GFR calc non Af Amer: >60 mL/min (ref >60)
Glucose, Bld: 95 mg/dL (ref 70–99)
Potassium: 4.1 mmol/L (ref 3.5–5.1)
Sodium: 139 mmol/L (ref 135–145)

## 2019-01-29 LAB — CBC
HCT: 32.2 % — ABNORMAL LOW (ref 36.0–46.0)
Hemoglobin: 9.9 g/dL — ABNORMAL LOW (ref 12.0–15.0)
MCH: 21.7 pg — ABNORMAL LOW (ref 26.0–34.0)
MCHC: 30.7 g/dL (ref 30.0–36.0)
MCV: 70.6 fL — ABNORMAL LOW (ref 80.0–100.0)
Platelets: 265 10*3/uL (ref 150–400)
RBC: 4.56 MIL/uL (ref 3.87–5.11)
RDW: 18.6 % — ABNORMAL HIGH (ref 11.5–15.5)
WBC: 10.4 10*3/uL (ref 4.0–10.5)
nRBC: 0.5 % — ABNORMAL HIGH (ref 0.0–0.2)

## 2019-01-29 LAB — TROPONIN I (HIGH SENSITIVITY)
Troponin I (High Sensitivity): 5 ng/L (ref <18)
Troponin I (High Sensitivity): 5 ng/L (ref <18)

## 2019-01-29 LAB — MAGNESIUM: Magnesium: 2.1 mg/dL (ref 1.7–2.4)

## 2019-01-29 LAB — TSH: TSH: 1.473 u[IU]/mL (ref 0.350–4.500)

## 2019-01-29 LAB — SARS CORONAVIRUS 2 BY RT PCR (HOSPITAL ORDER, PERFORMED IN ~~LOC~~ HOSPITAL LAB): SARS Coronavirus 2: NEGATIVE

## 2019-01-29 MED ORDER — SODIUM CHLORIDE 0.9% FLUSH
3.0000 mL | Freq: Once | INTRAVENOUS | Status: AC
  Administered 2019-01-29: 3 mL via INTRAVENOUS

## 2019-01-29 MED ORDER — ROPINIROLE HCL 1 MG PO TABS
0.5000 mg | ORAL_TABLET | Freq: Every day | ORAL | Status: DC
  Administered 2019-01-29: 0.5 mg via ORAL
  Filled 2019-01-29: qty 2

## 2019-01-29 MED ORDER — DOCUSATE SODIUM 100 MG PO CAPS: 100.0000 mg | ORAL_CAPSULE | Freq: Every day | ORAL | Status: DC | PRN

## 2019-01-29 MED ORDER — ASPIRIN 81 MG PO CHEW
324.0000 mg | CHEWABLE_TABLET | Freq: Once | ORAL | Status: AC
  Administered 2019-01-29: 13:00:00 324 mg via ORAL
  Filled 2019-01-29: qty 4

## 2019-01-29 MED ORDER — VENLAFAXINE HCL ER 75 MG PO CP24
75.0000 mg | ORAL_CAPSULE | Freq: Every day | ORAL | Status: DC
  Administered 2019-01-29 – 2019-01-30 (×2): 75 mg via ORAL
  Filled 2019-01-29 (×2): qty 1

## 2019-01-29 MED ORDER — ENOXAPARIN SODIUM 40 MG/0.4ML ~~LOC~~ SOLN
40.0000 mg | SUBCUTANEOUS | Status: DC
  Administered 2019-01-29: 40 mg via SUBCUTANEOUS
  Filled 2019-01-29: qty 0.4

## 2019-01-29 MED ORDER — ASPIRIN 325 MG PO TABS
325.0000 mg | ORAL_TABLET | Freq: Every day | ORAL | Status: DC
  Administered 2019-01-30: 325 mg via ORAL
  Filled 2019-01-29: qty 1

## 2019-01-29 MED ORDER — LOSARTAN POTASSIUM 50 MG PO TABS
50.0000 mg | ORAL_TABLET | Freq: Every day | ORAL | Status: DC
  Administered 2019-01-29 – 2019-01-30 (×2): 50 mg via ORAL
  Filled 2019-01-29 (×2): qty 1

## 2019-01-29 MED ORDER — TIZANIDINE HCL 2 MG PO TABS
2.0000 mg | ORAL_TABLET | Freq: Four times a day (QID) | ORAL | Status: DC | PRN
  Filled 2019-01-29: qty 1

## 2019-01-29 MED ORDER — ALBUTEROL SULFATE (2.5 MG/3ML) 0.083% IN NEBU: 2.5000 mg | INHALATION_SOLUTION | Freq: Four times a day (QID) | RESPIRATORY_TRACT | Status: DC | PRN

## 2019-01-29 MED ORDER — CALCIUM CARBONATE-VITAMIN D 500-200 MG-UNIT PO TABS
2.0000 | ORAL_TABLET | Freq: Every day | ORAL | Status: DC
  Administered 2019-01-30: 2 via ORAL
  Filled 2019-01-29: qty 2

## 2019-01-29 MED ORDER — VITAMIN C 500 MG PO TABS
500.0000 mg | ORAL_TABLET | Freq: Every day | ORAL | Status: DC
  Administered 2019-01-30: 500 mg via ORAL
  Filled 2019-01-29: qty 1

## 2019-01-29 MED ORDER — GABAPENTIN 300 MG PO CAPS
300.0000 mg | ORAL_CAPSULE | Freq: Every day | ORAL | Status: DC
  Administered 2019-01-29: 300 mg via ORAL
  Filled 2019-01-29: qty 1

## 2019-01-29 MED ORDER — BUPROPION HCL ER (SR) 100 MG PO TB12
100.0000 mg | ORAL_TABLET | Freq: Two times a day (BID) | ORAL | Status: DC
  Administered 2019-01-29 – 2019-01-30 (×3): 100 mg via ORAL
  Filled 2019-01-29 (×4): qty 1

## 2019-01-29 MED ORDER — TRAMADOL HCL 50 MG PO TABS: 50.0000 mg | ORAL_TABLET | Freq: Two times a day (BID) | ORAL | Status: DC | PRN

## 2019-01-29 MED ORDER — HYDROXYZINE HCL 25 MG PO TABS: 25.0000 mg | ORAL_TABLET | Freq: Three times a day (TID) | ORAL | Status: DC | PRN

## 2019-01-29 MED ORDER — ROSUVASTATIN CALCIUM 10 MG PO TABS
10.0000 mg | ORAL_TABLET | Freq: Every day | ORAL | Status: DC
  Administered 2019-01-29 – 2019-01-30 (×2): 10 mg via ORAL
  Filled 2019-01-29 (×2): qty 1

## 2019-01-29 MED ORDER — VITAMIN B-12 1000 MCG PO TABS
1000.0000 ug | ORAL_TABLET | Freq: Every day | ORAL | Status: DC
  Administered 2019-01-30: 1000 ug via ORAL
  Filled 2019-01-29: qty 1

## 2019-01-29 MED ORDER — PROGESTERONE MICRONIZED 100 MG PO CAPS
200.0000 mg | ORAL_CAPSULE | Freq: Every day | ORAL | Status: DC
  Administered 2019-01-29: 200 mg via ORAL
  Filled 2019-01-29 (×2): qty 2

## 2019-01-29 MED ORDER — NITROGLYCERIN 0.4 MG SL SUBL
0.4000 mg | SUBLINGUAL_TABLET | SUBLINGUAL | Status: DC | PRN
  Administered 2019-01-29 – 2019-01-30 (×2): 0.4 mg via SUBLINGUAL
  Filled 2019-01-29 (×2): qty 1

## 2019-01-29 NOTE — Plan of Care (Signed)
  Problem: Activity: Goal: Ability to tolerate increased activity will improve Outcome: Progressing   Problem: Cardiac: Goal: Ability to achieve and maintain adequate cardiovascular perfusion will improve Outcome: Progressing   

## 2019-01-29 NOTE — ED Triage Notes (Signed)
C/O intermittent "heart skipping" x 3 days.  States symptoms are intermittent.  C/O mid chest pain that radiates up into neck and left arm.  Also c/o SOB.

## 2019-01-29 NOTE — ED Provider Notes (Signed)
[facility] Emergency Department Provider Note  ____________________________________________   First MD Initiated Contact with Patient 01/29/19 1200     (approximate)  I have reviewed the triage vital signs and the nursing notes.  History  Chief Complaint Palpitations    HPI Cheryl [name] is a 63 y.o. female with a history of anemia, tobacco use, strong family history of CAD, HLD, who presents the emergency department for episodes of palpitations, associated with central chest pressure, shortness of breath, and nausea. She reports intermittent episodes of palpations for years, but the associated chest discomfort with arm radiation is new. She reports the symptoms have been present at baseline since Monday/Tuesday, but seem to intermittently worsen in severity.  They do worsen with exertion.  Last night, she developed radiation of her pressure to her left arm, which prompted her to seek care.  At present, symptoms are mild in severity.  She denies any fevers, cough, sick contacts.  No recent travel.  No history of DVT or PE. Follows with Dr. Mariah MillingGollan. Last stress test, echocardiogram in 2012.          Past Medical Hx Past Medical History:  Diagnosis Date  . 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 Arrhythmia    left bundle branch block  . Arthritis   . Blood in stool   . Chicken pox   . Generalized headaches   . History of blood transfusion   . Kidney disease, chronic, stage III (GFR 30-59 ml/min) (HCC)   . LBBB (left bundle branch block)   . Renal disorder   . Thyroid disease   . UTI (lower urinary tract infection)     Problem List Patient Active Problem List   Diagnosis Date Noted  . Restless legs 11/09/2018  . Educated About Covid-19 Virus Infection 11/09/2018  . Osteoarthritis of both shoulders 08/03/2018   . Cervical radiculopathy 12/30/2017  . Carotid artery stenosis 09/09/2017  . Right lateral epicondylitis 08/13/2017  . Nonallopathic lesion of cervical region 08/13/2017  . Nonallopathic lesion of thoracic region 08/13/2017  . Nonallopathic lesion of lumbosacral region 08/13/2017  . Chronic renal insufficiency 04/03/2017  . Renal hypertension 12/16/2016  . Diverticulosis of colon 09/02/2016  . PAD (peripheral artery disease) (HCC) 08/22/2016  . Renal artery stenosis (HCC) 08/22/2016  . Abnormal mammogram of right breast 06/03/2016  . Greater trochanteric bursitis of right hip 12/02/2015  . Iron deficiency anemia 11/25/2015  . Microcytic anemia 11/15/2015  . 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 ANA positive 06/21/2015  . Chronic neck pain 06/21/2015  . Arthralgia 05/19/2015  . Dyslipidemia (high LDL; low HDL) 04/16/2015  . Palpitations 03/28/2015  . Pancreatic cyst 10/24/2014  . Multiple lung nodules 10/24/2014  . Hyperlipidemia 10/20/2014  . Chronic lower back pain 09/18/2013  . Screening for osteoporosis 08/20/2013  . Weight gain 08/20/2013  . Routine general medical examination at a health care facility 10/26/2012  . Anxiety 08/22/2012  . Irritable bowel syndrome 08/17/2012  . History of tobacco abuse 08/17/2012  . Hearing loss d/t noise 08/17/2012  . Diverticulosis 08/17/2012  . OSA (obstructive sleep apnea) 08/17/2012  . Hematuria, microscopic 08/16/2012  . Thalassemia minor 08/16/2012    Past Surgical Hx Past Surgical History:  Procedure Laterality Date  . ABDOMINAL HYSTERECTOMY  1997  . APPENDECTOMY  1981  . BREAST EXCISIONAL BIOPSY Bilateral 1976   neg  .  BREAST SURGERY  1976  . CARDIAC CATHETERIZATION  2004   [facility]  . CARDIAC CATHETERIZATION  2006   [facility]  . cystic fibrosis tumor removal  1983  . THUMB AMPUTATION  1992   traumatic  . TONSILLECTOMY AND ADENOIDECTOMY  1964    Medications Prior to Admission medications   Medication Sig Start Date  End Date Taking? Authorizing Provider  albuterol (PROVENTIL HFA;VENTOLIN HFA) 108 (90 Base) MCG/ACT inhaler Inhale 2 puffs into the lungs every 6 (six) hours as needed for wheezing or shortness of breath. 06/06/18   Jodelle Green, FNP  buPROPion (WELLBUTRIN SR) 100 MG 12 hr tablet TAKE 1 TABLET BY MOUTH TWO  TIMES DAILY 01/20/19   Crecencio Mc, MD  Calcium Carbonate-Vit D-Min (CALCIUM 1200 PO) Take 1 tablet by mouth daily.    [provider]  Cyanocobalamin (VITAMIN B 12 PO) Take by mouth.    [provider]  cyclobenzaprine (FLEXERIL) 5 MG tablet 1-2 TABLETS AT BEDTIME, CAN BE INCREASED TO TWICE A DAY AS NEEDED FOR MUSCLE SPASM 01/16/16   [provider]  Docusate Calcium (STOOL SOFTENER PO) Take by mouth.    [provider]  EPINEPHrine (EPIPEN) 0.3 mg/0.3 mL DEVI Inject 0.3 mLs (0.3 mg total) into the muscle once. Patient taking differently: Inject 0.3 mg into the muscle as needed.  08/15/12   Crecencio Mc, MD  gabapentin (NEURONTIN) 100 MG capsule TAKE 2 CAPSULES BY MOUTH AT BEDTIME 10/15/18   Lyndal Pulley, DO  gabapentin (NEURONTIN) 300 MG capsule TAKE 1 CAPSULE BY MOUTH  EVERY NIGHT AT BEDTIME 10/15/18   Lyndal Pulley, DO  HYDROcodone-acetaminophen (NORCO/VICODIN) 5-325 MG tablet Take 1 tablet by mouth daily as needed for severe pain. 01/09/19   Crecencio Mc, MD  HYDROcodone-acetaminophen (NORCO/VICODIN) 5-325 MG tablet Take 1 tablet by mouth daily as needed. 03/10/19   Crecencio Mc, MD  HYDROcodone-acetaminophen (NORCO/VICODIN) 5-325 MG tablet Take 1 tablet by mouth daily as needed. 01/12/19   Crecencio Mc, MD  hydrOXYzine (ATARAX/VISTARIL) 25 MG tablet TAKE 1 TABLET (25 MG TOTAL) BY MOUTH 3 (THREE) TIMES DAILY AS NEEDED FOR ITCHING. 12/14/13   Crecencio Mc, MD  losartan (COZAAR) 50 MG tablet Take 50 mg by mouth daily.    [provider]  nitroGLYCERIN (NITROSTAT) 0.4 MG SL tablet Place 1 tablet (0.4 mg total) under the tongue every  5 (five) minutes as needed for chest pain. 03/28/15   Minna Merritts, MD  progesterone (PROMETRIUM) 200 MG capsule Take 200 mg by mouth daily.    [provider]  rOPINIRole (REQUIP) 0.25 MG tablet 1-2 tablets at bedtime for restless legs 01/28/19   Crecencio Mc, MD  rosuvastatin (CRESTOR) 10 MG tablet Take 1 tablet (10 mg total) by mouth daily. 01/28/19   Crecencio Mc, MD  tiZANidine (ZANAFLEX) 2 MG tablet Take 1 tablet (2 mg total) by mouth every 6 (six) hours as needed for muscle spasms. 08/01/18   Crecencio Mc, MD  traMADol (ULTRAM) 50 MG tablet Take 1 tablet (50 mg total) by mouth 2 (two) times daily as needed. for pain 01/12/19   Crecencio Mc, MD  venlafaxine XR (EFFEXOR-XR) 75 MG 24 hr capsule TAKE 1 CAPSULE(75 MG) BY MOUTH DAILY WITH BREAKFAST 01/28/19   Crecencio Mc, MD  vitamin C (ASCORBIC ACID) 500 MG tablet Take 500 mg by mouth daily.    [provider]    Allergies Peanuts [peanut oil], Penicillins,  Sulfa antibiotics, Influenza vac split [flu virus vaccine], Mobic [meloxicam], and Nickel  Family Hx Family History  Problem Relation Age of Onset  . Heart disease Mother   . Arthritis Mother   . Cancer Mother        breast  . Hyperlipidemia Mother   . Hypertension Mother   . Heart attack Mother   . Breast cancer Mother 3  . Heart disease Father   . Diabetes Father   . Arthritis Father   . Hypertension Father   . Parkinson's disease Father   . Hodgkin's lymphoma Father        hodgkins disease, prostate  . Heart disease Sister   . Diabetes Sister   . Heart disease Brother 92       ami x 8,  4 vessel CABG   . Diabetes Brother   . Liver disease Brother   . Lung disease Brother   . Heart disease Maternal Grandmother   . Diabetes Maternal Grandmother   . Heart disease Maternal Grandfather   . Heart disease Paternal Grandmother   . Diabetes Paternal Grandmother   . Stroke Paternal Grandfather   . Heart disease Paternal Grandfather   .  Diabetes Paternal Grandfather   . Diabetes Maternal Aunt     Social Hx Social History   Tobacco Use  . Smoking status: Former Smoker    Packs/day: 0.25    Years: 34.00    Pack years: 8.50    Types: Cigarettes    Quit date: 04/10/2013    Years since quitting: 5.8  . Smokeless tobacco: Never Used  Substance Use Topics  . Alcohol use: Yes    Comment: occasional  . Drug use: No     Review of Systems  Constitutional: Negative for fever. Negative for chills. Eyes: Negative for visual changes. ENT: Negative for sore throat. Cardiovascular: + for chest pain. Respiratory: Negative for shortness of breath. Gastrointestinal: Negative for abdominal pain. Negative for nausea. Negative for vomiting. Genitourinary: Negative for dysuria. Musculoskeletal: Negative for leg swelling. Skin: Negative for rash. Neurological: Negative for for headaches.   Physical Exam  Vital Signs: ED Triage Vitals  Enc Vitals Group     BP 01/29/19 1043 (!) 147/72     Pulse Rate 01/29/19 1157 81     Resp 01/29/19 1043 18     Temp 01/29/19 1043 98.2 F (36.8 C)     Temp Source 01/29/19 1043 Oral     SpO2 01/29/19 1043 97 %     Weight 01/29/19 1039 162 lb 0.6 oz (73.5 kg)     Height --      Head Circumference --      Peak Flow --      Pain Score 01/29/19 1039 8     Pain Loc --      Pain Edu? --      Excl. in GC? --     Constitutional: Alert and oriented.  Eyes: Conjunctivae clear. Sclera anicteric. Head: Normocephalic. Atraumatic. Nose: No congestion. No rhinorrhea. Mouth/Throat: Mucous membranes are moist.  Neck: No stridor.   Cardiovascular: Normal rate, regular rhythm. No murmurs. Extremities well perfused. Respiratory: Normal respiratory effort.  Lungs CTAB. Gastrointestinal: Soft and non-tender. No distention.  Musculoskeletal: No lower extremity edema. Neurologic:  Normal speech and language. No gross focal neurologic deficits are appreciated.  Skin: Skin is warm, dry and intact.  No rash noted. Psychiatric: Mood and affect are appropriate for situation.  EKG  Personally reviewed.   Rate: 79  Rhythm: sinus Axis: normal Intervals: QRS widen 2/2 bundle branch block LBBB Largely unchanged from prior. No STEMI    Radiology  XR: IMPRESSION: Normal exam.    Procedures  Procedure(s) performed (including critical care):  Procedures   Initial Impression / Assessment and Plan / ED Course  63 y.o. female who presents to the ED for palpitations, chest pressure, as described above.  Ddx: ACS, arrhythmia, bronchitis, amongst others.  Anemia near baseline. EKG reveals left bundle branch block, no acute ischemic changes.  HS troponin 5. HEART score moderate, 5 (history, EKG, age, risk factors).  Given her moderate risk heart score, discussed close outpatient cardiology follow-up versus observation admission.  The patient states she is unable to get an appointment with her cardiologist until October.  Given this, based on her story with strong family history, will plan for admission.  Patient is agreeable to this.  Discussed with hospitalist.    Final Clinical Impression(s) / ED Diagnosis  Final diagnoses:  Chest pressure  Palpitations       Note:  This document was prepared using Dragon voice recognition software and may include unintentional dictation errors.   Miguel AschoffMonks, Tomie Spizzirri L., MD 01/29/19 1315

## 2019-01-29 NOTE — ED Notes (Signed)
Pt ambulatory to toilet

## 2019-01-29 NOTE — H&P (Addendum)
[place] [facility] [place] NAME: Cheryl [name]    MR#:  [id number]  DATE OF BIRTH:  05-16-1956  DATE OF ADMISSION:  01/29/2019  PRIMARY CARE PHYSICIAN: Crecencio Mc, MD   REQUESTING/REFERRING PHYSICIAN: 01/29/2019  CHIEF COMPLAINT:   Chief Complaint  Patient presents with   Palpitations  Chest pain/pressure   HISTORY OF PRESENT ILLNESS:  Cheryl [name]  is a 63 y.o. female with a known history of thalassemia trait anemia, tobacco use, hyperlipidemia and strong family history of coronary artery disease who presented to the emergency room with complaints of chest pressure and palpitations.  Symptoms have been going on for the last 2 to 3 days and progressively got worse as such patient presented to the emergency room.  Patient reports history of palpitations over the years for which she takes propranolol.  Took the propranolol but symptoms persisted.  Chest pain described as feeling of pressure in the center of her chest.  Nonradiating.  No nausea or vomiting.  Patient has history of chronic left bundle branch block for which she follows up with Dr. Rockey Situ.  Patient evaluated in the emergency room on cardiac enzymes negative x2.  Twelve-lead EKG with normal sinus rhythm with old left bundle branch block. Medical service called to admit patient for further evaluation and management.  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 2017   Arrhythmia    left bundle branch block   Arthritis    Blood in stool    Chicken pox    Generalized headaches    History of blood transfusion    Kidney disease, chronic, stage III (GFR 30-59 ml/min) (HCC)    LBBB (left bundle branch block)    Renal disorder    Thyroid disease    UTI (lower urinary tract  infection)     PAST SURGICAL HISTORY:   Past Surgical History:  Procedure Laterality Date   ABDOMINAL HYSTERECTOMY  [place]   BREAST EXCISIONAL BIOPSY Bilateral 1976   neg   [place]  2004   [facility]   CARDIAC CATHETERIZATION  2006   [facility]   cystic fibrosis tumor removal  1983   THUMB AMPUTATION  1992   traumatic   TONSILLECTOMY AND ADENOIDECTOMY  1964    SOCIAL HISTORY:   Social History   Tobacco Use   Smoking status: Former Smoker    Packs/day: 0.25    Years: 34.00    Pack years: 8.50    Types: Cigarettes    Quit date: 04/10/2013    Years since quitting: 5.8   Smokeless tobacco: Never Used  Substance Use Topics   Alcohol use: Yes    Comment: occasional   Patient smokes less than half a pack of cigarettes per day.  Denies history of alcohol or IV drug use.  FAMILY HISTORY:   Family History  Problem Relation Age of Onset   Heart disease Mother    Arthritis Mother    Cancer Mother        breast   Hyperlipidemia Mother    Hypertension Mother    Heart attack Mother    Breast cancer Mother 75   Heart disease Father    Diabetes Father    Arthritis Father  Hypertension Father    Parkinson's disease Father    Hodgkin's lymphoma Father        hodgkins disease, prostate   Heart disease Sister    Diabetes Sister    Heart disease Brother 64       ami x 8,  4 vessel CABG    Diabetes Brother    Liver disease Brother    Lung disease Brother    Heart disease Maternal Grandmother    Diabetes Maternal Grandmother    Heart disease Maternal Grandfather    Heart disease Paternal Grandmother    Diabetes Paternal Grandmother    Stroke Paternal Grandfather    Heart disease Paternal Grandfather    Diabetes Paternal Grandfather    Diabetes Maternal Aunt     DRUG ALLERGIES:   Allergies  Allergen Reactions   Peanuts [Peanut Oil]    Penicillins Other (See Comments)     Hives,rash,nausea,swelling,    Sulfa Antibiotics Other (See Comments)    Hives,rash,nausea,swelling   Influenza Vac Split [Flu Virus Vaccine]     Pleurisy  1996   Mobic [Meloxicam] Nausea Only   Nickel     REVIEW OF SYSTEMS:   Review of Systems  Constitutional: Negative for chills and fever.  HENT: Negative for hearing loss and tinnitus.   Eyes: Negative for blurred vision and double vision.  Respiratory: Negative for cough and shortness of breath.   Cardiovascular: Positive for chest pain and palpitations.  Gastrointestinal: Negative for heartburn and nausea.  Genitourinary: Negative for dysuria and urgency.  Musculoskeletal: Negative for myalgias and neck pain.  Skin: Negative for itching and rash.  Neurological: Negative for dizziness and headaches.  Psychiatric/Behavioral: Negative for depression and hallucinations.    MEDICATIONS AT HOME:   Prior to Admission medications   Medication Sig Start Date End Date Taking? Authorizing Provider  albuterol (PROVENTIL HFA;VENTOLIN HFA) 108 (90 Base) MCG/ACT inhaler Inhale 2 puffs into the lungs every 6 (six) hours as needed for wheezing or shortness of breath. 06/06/18  Yes Guse, Janna Arch, FNP  buPROPion (WELLBUTRIN SR) 100 MG 12 hr tablet TAKE 1 TABLET BY MOUTH TWO  TIMES DAILY Patient taking differently: Take 100 mg by mouth 2 (two) times daily.  01/20/19  Yes Sherlene Shams, MD  Calcium Carbonate-Vit D-Min (CALCIUM 1200 PO) Take 1 tablet by mouth daily.   Yes [provider]  docusate sodium (COLACE) 100 MG capsule Take 100 mg by mouth daily as needed for mild constipation.   Yes [provider]  gabapentin (NEURONTIN) 300 MG capsule TAKE 1 CAPSULE BY MOUTH  EVERY NIGHT AT BEDTIME Patient taking differently: Take 300 mg by mouth 2 (two) times daily.  10/15/18  Yes Judi Saa, DO  hydrOXYzine (ATARAX/VISTARIL) 25 MG tablet TAKE 1 TABLET (25 MG TOTAL) BY MOUTH 3 (THREE) TIMES DAILY AS NEEDED FOR  ITCHING. Patient taking differently: Take 25 mg by mouth 3 (three) times daily as needed for itching.  12/14/13  Yes Sherlene Shams, MD  losartan (COZAAR) 50 MG tablet Take 50 mg by mouth daily.   Yes [provider]  nitroGLYCERIN (NITROSTAT) 0.4 MG SL tablet Place 1 tablet (0.4 mg total) under the tongue every 5 (five) minutes as needed for chest pain. 03/28/15  Yes Gollan, Tollie Pizza, MD  progesterone (PROMETRIUM) 200 MG capsule Take 200 mg by mouth at bedtime.    Yes [provider]  rOPINIRole (REQUIP) 0.25 MG tablet 1-2 tablets at bedtime for restless legs Patient taking differently:  Take 0.25-0.5 mg by mouth at bedtime.  01/28/19  Yes Sherlene Shamsullo, Teresa L, MD  rosuvastatin (CRESTOR) 10 MG tablet Take 1 tablet (10 mg total) by mouth daily. 01/28/19  Yes Sherlene Shamsullo, Teresa L, MD  tiZANidine (ZANAFLEX) 2 MG tablet Take 1 tablet (2 mg total) by mouth every 6 (six) hours as needed for muscle spasms. 08/01/18  Yes Sherlene Shamsullo, Teresa L, MD  traMADol (ULTRAM) 50 MG tablet Take 1 tablet (50 mg total) by mouth 2 (two) times daily as needed. for pain 01/12/19  Yes Sherlene Shamsullo, Teresa L, MD  venlafaxine XR (EFFEXOR-XR) 75 MG 24 hr capsule TAKE 1 CAPSULE(75 MG) BY MOUTH DAILY WITH BREAKFAST Patient taking differently: Take 75 mg by mouth daily with breakfast.  01/28/19  Yes Sherlene Shamsullo, Teresa L, MD  vitamin B-12 (CYANOCOBALAMIN) 1000 MCG tablet Take 1,000 mcg by mouth daily.   Yes [provider]  vitamin C (ASCORBIC ACID) 500 MG tablet Take 500 mg by mouth daily.   Yes [provider]      VITAL SIGNS:  Blood pressure 135/72, pulse 67, temperature 98.2 F (36.8 C), temperature source Oral, resp. rate 16, weight 73.5 kg, SpO2 98 %.  PHYSICAL EXAMINATION:  Physical Exam  GENERAL:  63 y.o.-year-old patient lying in the bed with no acute distress.  EYES: Pupils equal, round, reactive to light and accommodation. No scleral icterus. Extraocular muscles intact.  HEENT: Head atraumatic, normocephalic.  Oropharynx and nasopharynx clear.  NECK:  Supple, no jugular venous distention. No thyroid enlargement, no tenderness.  LUNGS: Normal breath sounds bilaterally, no wheezing, rales,rhonchi or crepitation. No use of accessory muscles of respiration.  CARDIOVASCULAR: S1, S2 normal. No murmurs, rubs, or gallops.  ABDOMEN: Soft, nontender, nondistended. Bowel sounds present. No organomegaly or mass.  EXTREMITIES: No pedal edema, cyanosis, or clubbing.  NEUROLOGIC: Cranial nerves II through XII are intact. Muscle strength 5/5 in all extremities. Sensation intact. Gait not checked.  PSYCHIATRIC: The patient is alert and oriented x 3.  SKIN: No obvious rash, lesion, or ulcer.   LABORATORY PANEL:   CBC Recent Labs  Lab 01/29/19 1047  WBC 10.4  HGB 9.9*  HCT 32.2*  PLT 265   ------------------------------------------------------------------------------------------------------------------  Chemistries  Recent Labs  Lab 01/29/19 1047 01/29/19 1306  NA 139  --   K 4.1  --   CL 107  --   CO2 24  --   GLUCOSE 95  --   BUN 12  --   CREATININE 0.96  --   CALCIUM 9.1  --   MG  --  2.1   ------------------------------------------------------------------------------------------------------------------  Cardiac Enzymes No results for input(s): TROPONINI in the last 168 hours. ------------------------------------------------------------------------------------------------------------------  RADIOLOGY:  Dg Chest 2 View  Result Date: 01/29/2019 CLINICAL DATA:  Chest pain.  Shortness of breath.  Palpitations. EXAM: CHEST - 2 VIEW COMPARISON:  06/23/2018 FINDINGS: The heart size and mediastinal contours are within normal limits. Both lungs are clear. The visualized skeletal structures are unremarkable. IMPRESSION: Normal exam. Electronically Signed   By: Francene BoyersJames  Maxwell M.D.   On: 01/29/2019 11:07      IMPRESSION AND PLAN:  Patient is a 63 year old female with history of thalassemia trait  anemia, hyperlipidemia and tobacco use who presented to the emergency room with complaints of chest pressure and palpitations.  1.  Chest pain Described as feeling of pressure in the center of the chest. Patient with strong family history of MI in multiple family members. So far no evidence of acute coronary syndrome.  Twelve-lead  EKG with normal sinus rhythm with old left bundle branch block. I discussed case with cardiologist; Dr. Windell HummingbirdGollan's PA.  Dr. Durwin GlazeGlover recommended ordering stress test for tomorrow.  They will read the stress test and if abnormal will see patient in consultation.  Keep n.p.o. after midnight for stress test  Placed on aspirin.  PRN sublingual nitroglycerin.  Lipid panel in a.m.  2.  History of thalassemia trait anemia Stable  3.  Tobacco use. Patient smokes less than half a pack of cigarettes per day.  Smoking cessation counseling done.  Declined nicotine patch.  4.  Hyperlipidemia Placed on statins.  Lipid panel in a.m.  DVT prophylaxis; Lovenox  All the records are reviewed and case discussed with ED provider. Management plans discussed with the patient, and she is in agreement.  CODE STATUS: Full code  TOTAL TIME TAKING CARE OF THIS PATIENT: 52 minutes.    Radek Carnero M.D on 01/29/2019 at 2:14 PM  Between 7am to 6pm - Pager - [phone]  After 6pm go to [email or web address] - Social research [email or web address], [email or web address] [email or web address]  [employer] [place]  Office  [phone]  CC: Primary care physician; Sherlene Shamsullo, Teresa L, MD   Note: This dictation was prepared with Dragon dictation along with smaller phrase technology. Any transcriptional errors that result from this process are unintentional.

## 2019-01-29 NOTE — ED Notes (Signed)
ED TO INPATIENT HANDOFF REPORT  ED Nurse Name and Phone #: Morrie Sheldon, RN, [phone]  S Name/Age/Gender Cheryl [name] 63 y.o. female Room/Bed: ED13A/ED13A  Code Status   Code Status: Not on file  Home/SNF/Other Home Patient oriented to: self, place, time and situation Is this baseline? Yes   Triage Complete: Triage complete  Chief Complaint heart skipping  Triage Note C/O intermittent "heart skipping" x 3 days.  States symptoms are intermittent.  C/O mid chest pain that radiates up into neck and left arm.  Also c/o SOB.   Allergies Allergies  Allergen Reactions  . Peanuts [Peanut Oil]   . Penicillins Other (See Comments)    Hives,rash,nausea,swelling,   . Sulfa Antibiotics Other (See Comments)    Hives,rash,nausea,swelling  . Influenza Vac Split [Flu Virus Vaccine]     Pleurisy  1996  . Mobic [Meloxicam] Nausea Only  . Nickel     Level of Care/Admitting Diagnosis ED Disposition    ED Disposition Condition Comment   Admit  Hospital Area: [facility] [[id number]]  Level of Care: Telemetry [5]  Covid Evaluation: Asymptomatic Screening Protocol (No Symptoms)  Diagnosis: Chest pain [683729]  Admitting Physician: Jama Flavors [[id number]]  Attending Physician: Jama Flavors [[id number]]  PT Class (Do Not Modify): Observation [104]  PT Acc Code (Do Not Modify): Observation [10022]       B Medical/Surgery History Past Medical History:  Diagnosis Date  . 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 Arrhythmia    left bundle branch block  . Arthritis   . Blood in stool   . Chicken pox   . Generalized headaches   . History of blood transfusion   . Kidney disease, chronic, stage III (GFR 30-59 ml/min) (HCC)   . LBBB (left bundle branch block)   . Renal disorder   . Thyroid disease   . UTI (lower urinary tract infection)    Past  Surgical History:  Procedure Laterality Date  . ABDOMINAL HYSTERECTOMY  1997  . APPENDECTOMY  1981  . BREAST EXCISIONAL BIOPSY Bilateral 1976   neg  . BREAST SURGERY  1976  . CARDIAC CATHETERIZATION  2004   [facility]  . CARDIAC CATHETERIZATION  2006   [facility]  . cystic fibrosis tumor removal  1983  . THUMB AMPUTATION  1992   traumatic  . TONSILLECTOMY AND ADENOIDECTOMY  1964     A IV Location/Drains/Wounds Patient Lines/Drains/Airways Status   Active Line/Drains/Airways    None          Intake/Output Last 24 hours No intake or output data in the 24 hours ending 01/29/19 1403  Labs/Imaging Results for orders placed or performed during the hospital encounter of 01/29/19 (from the past 48 hour(s))  Basic metabolic panel     Status: None   Collection Time: 01/29/19 10:47 AM  Result Value Ref Range   Sodium 139 135 - 145 mmol/L   Potassium 4.1 3.5 - 5.1 mmol/L   Chloride 107 98 - 111 mmol/L   CO2 24 22 - 32 mmol/L   Glucose, Bld 95 70 - 99 mg/dL   BUN 12 8 - 23 mg/dL   Creatinine, Ser 0.21 0.44 - 1.00 mg/dL   Calcium 9.1 8.9 - 11.5 mg/dL   GFR calc non Af Amer >60 >60 mL/min   GFR calc Af Amer >60 >60 mL/min   Anion gap 8 5 -  15    Comment: Performed at [facility], [address] [place], [place] [address]  CBC     Status: Abnormal   Collection Time: 01/29/19 10:47 AM  Result Value Ref Range   WBC 10.4 4.0 - 10.5 K/uL   RBC 4.56 3.87 - 5.11 MIL/uL   Hemoglobin 9.9 (L) 12.0 - 15.0 g/dL   HCT 32.2 (L) 36.0 - 46.0 %   MCV 70.6 (L) 80.0 - 100.0 fL   MCH 21.7 (L) 26.0 - 34.0 pg   MCHC 30.7 30.0 - 36.0 g/dL   RDW 18.6 (H) 11.5 - 15.5 %   Platelets 265 150 - 400 K/uL   nRBC 0.5 (H) 0.0 - 0.2 %    Comment: Performed at [facility], [place], [place] [address]  Troponin I (High Sensitivity)     Status: None   Collection Time: 01/29/19 10:47 AM  Result Value Ref Range   Troponin I (High Sensitivity) 5 <18 ng/L    Comment:  (NOTE) Elevated high sensitivity troponin I (hsTnI) values and significant  changes across serial measurements may suggest ACS but many other  chronic and acute conditions are known to elevate hsTnI results.  Refer to the "Links" section for chest pain algorithms and additional  guidance. Performed at [facility], [place], [place] [address]   Troponin I (High Sensitivity)     Status: None   Collection Time: 01/29/19  1:06 PM  Result Value Ref Range   Troponin I (High Sensitivity) 5 <18 ng/L    Comment: (NOTE) Elevated high sensitivity troponin I (hsTnI) values and significant  changes across serial measurements may suggest ACS but many other  chronic and acute conditions are known to elevate hsTnI results.  Refer to the "Links" section for chest pain algorithms and additional  guidance. Performed at [facility], [address] [place], [place] [address]   TSH     Status: None   Collection Time: 01/29/19  1:06 PM  Result Value Ref Range   TSH 1.473 0.350 - 4.500 uIU/mL    Comment: Performed by a 3rd Generation assay with a functional sensitivity of <=0.01 uIU/mL. Performed at [facility], [address] [place], [place] [address]   Magnesium     Status: None   Collection Time: 01/29/19  1:06 PM  Result Value Ref Range   Magnesium 2.1 1.7 - 2.4 mg/dL    Comment: Performed at [facility], [address] [place], [place] [address]   Dg Chest 2 View  Result Date: 01/29/2019 CLINICAL DATA:  Chest pain.  Shortness of breath.  Palpitations. EXAM: CHEST - 2 VIEW COMPARISON:  06/23/2018 FINDINGS: The heart size and mediastinal contours are within normal limits. Both lungs are clear. The visualized skeletal structures are unremarkable. IMPRESSION: Normal exam. Electronically Signed   By: Lorriane Shire M.D.   On: 01/29/2019 11:07    Pending Labs Unresulted Labs (From admission, onward)    Start     Ordered   01/29/19 1236   SARS Coronavirus 2 [facility] order, Performed in [facility] hospital lab) Nasopharyngeal Nasopharyngeal Swab  (Symptomatic/High Risk of Exposure/Tier 1 Patients Labs with Precautions)  ONCE - STAT,   STAT    Question Answer Comment  Is this test for diagnosis or screening Diagnosis of ill patient   Symptomatic for COVID-19 as defined by CDC Yes   Date of Symptom Onset 01/29/2019   Hospitalized for COVID-19 No  Admitted to ICU for COVID-19 No   Previously tested for COVID-19 No   Resident in a congregate (group) care setting No   Employed in healthcare setting No   Pregnant No      01/29/19 1235   Signed and Held  HIV antibody (Routine Testing)  Once,   R     Signed and Held          Vitals/Pain Today's Vitals   01/29/19 1039 01/29/19 1043 01/29/19 1157 01/29/19 1230  BP:  (!) 147/72 (!) 148/80 135/70  Pulse:   81 69  Resp:  18 18 (!) 25  Temp:  98.2 F (36.8 C)    TempSrc:  Oral    SpO2:  97% 100% 97%  Weight: 73.5 kg     PainSc: 8        Isolation Precautions No active isolations  Medications Medications  sodium chloride flush (NS) 0.9 % injection 3 mL (has no administration in time range)  aspirin chewable tablet 324 mg (324 mg Oral Given 01/29/19 1235)    Mobility walks Low fall risk   Focused Assessments Cardiac Assessment Handoff:  Cardiac Rhythm: Normal sinus rhythm No results found for: CKTOTAL, CKMB, CKMBINDEX, TROPONINI No results found for: DDIMER Does the Patient currently have chest pain? Yes      R Recommendations: See Admitting Provider Note  Report given to:   Additional Notes: N/A

## 2019-01-30 ENCOUNTER — Encounter: Payer: Self-pay | Admitting: Radiology

## 2019-01-30 ENCOUNTER — Observation Stay: Payer: BC Managed Care – PPO

## 2019-01-30 ENCOUNTER — Observation Stay (HOSPITAL_BASED_OUTPATIENT_CLINIC_OR_DEPARTMENT_OTHER): Payer: BC Managed Care – PPO

## 2019-01-30 DIAGNOSIS — E785 Hyperlipidemia, unspecified: Secondary | ICD-10-CM | POA: Diagnosis not present

## 2019-01-30 DIAGNOSIS — R0602 Shortness of breath: Secondary | ICD-10-CM | POA: Diagnosis not present

## 2019-01-30 DIAGNOSIS — D649 Anemia, unspecified: Secondary | ICD-10-CM | POA: Diagnosis not present

## 2019-01-30 DIAGNOSIS — R079 Chest pain, unspecified: Secondary | ICD-10-CM

## 2019-01-30 DIAGNOSIS — Z720 Tobacco use: Secondary | ICD-10-CM | POA: Diagnosis not present

## 2019-01-30 LAB — BASIC METABOLIC PANEL
Anion gap: 9 (ref 5–15)
BUN: 15 mg/dL (ref 8–23)
CO2: 22 mmol/L (ref 22–32)
Calcium: 8.9 mg/dL (ref 8.9–10.3)
Chloride: 108 mmol/L (ref 98–111)
Creatinine, Ser: 1.01 mg/dL — ABNORMAL HIGH (ref 0.44–1.00)
GFR calc Af Amer: >60 mL/min (ref >60)
GFR calc non Af Amer: 59 mL/min — ABNORMAL LOW (ref >60)
Glucose, Bld: 100 mg/dL — ABNORMAL HIGH (ref 70–99)
Potassium: 4.1 mmol/L (ref 3.5–5.1)
Sodium: 139 mmol/L (ref 135–145)

## 2019-01-30 LAB — NM MYOCAR MULTI W/SPECT W/WALL MOTION / EF
Estimated workload: 1 METS
Exercise duration (min): 0 min
Exercise duration (sec): 0 s
LV dias vol: 135 mL (ref 46–106)
LV sys vol: 69 mL
MPHR: 157 {beats}/min
Peak HR: 106 {beats}/min
Percent HR: 67 %
Rest HR: 72 {beats}/min
SDS: 0
SRS: 19
SSS: 2
TID: 0.93

## 2019-01-30 LAB — LIPID PANEL
Cholesterol: 107 mg/dL (ref 0–200)
HDL: 27 mg/dL — ABNORMAL LOW (ref >40)
LDL Cholesterol: 47 mg/dL (ref 0–99)
Total CHOL/HDL Ratio: 4 RATIO
Triglycerides: 164 mg/dL — ABNORMAL HIGH (ref <150)
VLDL: 33 mg/dL (ref 0–40)

## 2019-01-30 LAB — MAGNESIUM: Magnesium: 2.1 mg/dL (ref 1.7–2.4)

## 2019-01-30 MED ORDER — ASPIRIN 325 MG PO TABS: 325.0000 mg | ORAL_TABLET | Freq: Four times a day (QID) | ORAL | Status: DC | PRN

## 2019-01-30 MED ORDER — REGADENOSON 0.4 MG/5ML IV SOLN
0.4000 mg | Freq: Once | INTRAVENOUS | Status: AC
  Administered 2019-01-30: 0.4 mg via INTRAVENOUS

## 2019-01-30 MED ORDER — TECHNETIUM TC 99M TETROFOSMIN IV KIT
30.4900 | PACK | Freq: Once | INTRAVENOUS | Status: AC | PRN
  Administered 2019-01-30: 30.49 via INTRAVENOUS

## 2019-01-30 MED ORDER — IOHEXOL 350 MG/ML SOLN
75.0000 mL | Freq: Once | INTRAVENOUS | Status: AC | PRN
  Administered 2019-01-30: 75 mL via INTRAVENOUS

## 2019-01-30 MED ORDER — TECHNETIUM TC 99M TETROFOSMIN IV KIT
10.0000 | PACK | Freq: Once | INTRAVENOUS | Status: AC | PRN
  Administered 2019-01-30: 10.89 via INTRAVENOUS

## 2019-01-30 NOTE — Progress Notes (Signed)
Patient off floor for stress test. No complaints from patient at this time.

## 2019-01-30 NOTE — Discharge Summary (Signed)
[facility] -  at [facility]   PATIENT NAME: Cheryl [name]    MR#:  [id number]  DATE OF BIRTH:  06-14-1955  DATE OF ADMISSION:  01/29/2019 ADMITTING PHYSICIAN: Jama Flavors, MD  DATE OF DISCHARGE: 01/30/19  PRIMARY CARE PHYSICIAN: Sherlene Shams, MD    ADMISSION DIAGNOSIS:  Palpitations [R00.2] Chest pressure [R07.89]  DISCHARGE DIAGNOSIS:  Atypical chest pain  SECONDARY DIAGNOSIS:   Past Medical History:  Diagnosis Date  . 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 Arrhythmia    left bundle branch block  . Arthritis   . Blood in stool   . Chicken pox   . Generalized headaches   . History of blood transfusion   . Kidney disease, chronic, stage III (GFR 30-59 ml/min) (HCC)   . LBBB (left bundle branch block)   . Renal disorder   . Thyroid disease   . UTI (lower urinary tract infection)     HOSPITAL COURSE:   HPI  Cheryl [name]  is a 63 y.o. female with a known history of thalassemia trait anemia, tobacco use, hyperlipidemia and strong family history of coronary artery disease who presented to the emergency room with complaints of chest pressure and palpitations.  Symptoms have been going on for the last 2 to 3 days and progressively got worse as such patient presented to the emergency room.  Patient reports history of palpitations over the years for which she takes propranolol.  Took the propranolol but symptoms persisted.  Chest pain described as feeling of pressure in the center of her chest.  Nonradiating.  No nausea or vomiting.  Patient has history of chronic left bundle branch block for which she follows up with Dr. [name].  Patient evaluated in the emergency room on cardiac enzymes negative x2.  Twelve-lead EKG with normal sinus rhythm with old left bundle branch block. Medical service called to admit patient  for further evaluation and management.  .  Chest pain-atypical Described as feeling of pressure in the center of the chest at the time of admission Patient with strong family history of MI in multiple family members. So far no evidence of acute coronary syndrome.  Twelve-lead EKG with normal sinus rhythm with old left bundle branch block. I discussed case with cardiologist; Dr. Windell Hummingbird PA.  recommended ordering stress test .  Patient had Myoview stress test .unchanged from year 2012 CT angiogram the chest no pulmonary embolism  placed on aspirin.  Patient is doing fine otherwise will discharge patient patient is agreeable  2.  History of thalassemia trait anemia Stable  3.  Tobacco use. Patient smokes less than half a pack of cigarettes per day.  Smoking cessation counseling done.  Declined nicotine patch.  4.  Hyperlipidemia Placed on statins.  Lipid panel -LDL 47  DVT prophylaxis; Lovenox  DISCHARGE CONDITIONS:   Stable  CONSULTS OBTAINED:     PROCEDURES Myoview stress test-no acute findings  DRUG ALLERGIES:   Allergies  Allergen Reactions  . Peanuts [Peanut Oil]   . Penicillins Other (See Comments)    Hives,rash,nausea,swelling,   . Sulfa Antibiotics Other (See Comments)    Hives,rash,nausea,swelling  . Influenza Vac Split [Flu Virus Vaccine]     Pleurisy  1996  . Mobic [Meloxicam] Nausea Only  . Nickel     DISCHARGE MEDICATIONS:   Allergies as of 01/30/2019  Reactions   Peanuts [peanut Oil]    Penicillins Other (See Comments)   Hives,rash,nausea,swelling,    Sulfa Antibiotics Other (See Comments)   Hives,rash,nausea,swelling   Influenza Vac Split [flu Virus Vaccine]    Pleurisy  1996   Mobic [meloxicam] Nausea Only   Nickel       Medication List    TAKE these medications   albuterol 108 (90 Base) MCG/ACT inhaler Commonly known as: VENTOLIN HFA Inhale 2 puffs into the lungs every 6 (six) hours as needed for wheezing or shortness of breath.    aspirin 325 MG tablet Take 1 tablet (325 mg total) by mouth every 6 (six) hours as needed.   buPROPion 100 MG 12 hr tablet Commonly known as: WELLBUTRIN SR TAKE 1 TABLET BY MOUTH TWO  TIMES DAILY   CALCIUM 1200 PO Take 1 tablet by mouth daily.   docusate sodium 100 MG capsule Commonly known as: COLACE Take 100 mg by mouth daily as needed for mild constipation.   gabapentin 300 MG capsule Commonly known as: NEURONTIN TAKE 1 CAPSULE BY MOUTH  EVERY NIGHT AT BEDTIME What changed:   how much to take  how to take this  when to take this  additional instructions   hydrOXYzine 25 MG tablet Commonly known as: ATARAX/VISTARIL TAKE 1 TABLET (25 MG TOTAL) BY MOUTH 3 (THREE) TIMES DAILY AS NEEDED FOR ITCHING. What changed: See the new instructions.   losartan 50 MG tablet Commonly known as: COZAAR Take 50 mg by mouth daily.   nitroGLYCERIN 0.4 MG SL tablet Commonly known as: NITROSTAT Place 1 tablet (0.4 mg total) under the tongue every 5 (five) minutes as needed for chest pain.   progesterone 200 MG capsule Commonly known as: PROMETRIUM Take 200 mg by mouth at bedtime.   rOPINIRole 0.25 MG tablet Commonly known as: Requip 1-2 tablets at bedtime for restless legs What changed:   how much to take  how to take this  when to take this  additional instructions   rosuvastatin 10 MG tablet Commonly known as: CRESTOR Take 1 tablet (10 mg total) by mouth daily.   tiZANidine 2 MG tablet Commonly known as: ZANAFLEX Take 1 tablet (2 mg total) by mouth every 6 (six) hours as needed for muscle spasms.   traMADol 50 MG tablet Commonly known as: ULTRAM Take 1 tablet (50 mg total) by mouth 2 (two) times daily as needed. for pain   venlafaxine XR 75 MG 24 hr capsule Commonly known as: EFFEXOR-XR TAKE 1 CAPSULE(75 MG) BY MOUTH DAILY WITH BREAKFAST What changed:   how much to take  how to take this  when to take this  additional instructions   vitamin B-12 1000  MCG tablet Commonly known as: CYANOCOBALAMIN Take 1,000 mcg by mouth daily.   vitamin C 500 MG tablet Commonly known as: ASCORBIC ACID Take 500 mg by mouth daily.        DISCHARGE INSTRUCTIONS:   Follow-up with primary care physician in 3 to 4 days  DIET:  Cardiac diet  DISCHARGE CONDITION:  Fair  ACTIVITY:  Activity as tolerated  OXYGEN:  Home Oxygen: No.   Oxygen Delivery: room air  DISCHARGE LOCATION:  home   If you experience worsening of your admission symptoms, develop shortness of breath, life threatening emergency, suicidal or homicidal thoughts you must seek medical attention immediately by calling 911 or calling your MD immediately  if symptoms less severe.  You Must read complete instructions/literature along with all the possible  adverse reactions/side effects for all the Medicines you take and that have been prescribed to you. Take any new Medicines after you have completely understood and accpet all the possible adverse reactions/side effects.   Please note  You were cared for by a hospitalist during your hospital stay. If you have any questions about your discharge medications or the care you received while you were in the hospital after you are discharged, you can call the unit and asked to speak with the hospitalist on call if the hospitalist that took care of you is not available. Once you are discharged, your primary care physician will handle any further medical issues. Please note that NO REFILLS for any discharge medications will be authorized once you are discharged, as it is imperative that you return to your primary care physician (or establish a relationship with a primary care physician if you do not have one) for your aftercare needs so that they can reassess your need for medications and monitor your lab values.     Today  Chief Complaint  Patient presents with  . Palpitations   Patient is resting comfortably, lateral part of the chest  hurts when she is moving or taking deep breaths  ROS:  CONSTITUTIONAL: Denies fevers, chills. Denies any fatigue, weakness.  EYES: Denies blurry vision, double vision, eye pain. EARS, NOSE, THROAT: Denies tinnitus, ear pain, hearing loss. RESPIRATORY: Denies cough, wheeze, shortness of breath.  CARDIOVASCULAR:  chest pain with deep inspirations and movements, denies palpitations, edema.  GASTROINTESTINAL: Denies nausea, vomiting, diarrhea, abdominal pain. Denies bright red blood per rectum. GENITOURINARY: Denies dysuria, hematuria. ENDOCRINE: Denies nocturia or thyroid problems. HEMATOLOGIC AND LYMPHATIC: Denies easy bruising or bleeding. SKIN: Denies rash or lesion. MUSCULOSKELETAL: Denies pain in neck, back, shoulder, knees, hips or arthritic symptoms.  NEUROLOGIC: Denies paralysis, paresthesias.  PSYCHIATRIC: Denies anxiety or depressive symptoms.   VITAL SIGNS:  Blood pressure 121/82, pulse 81, temperature 98.4 F (36.9 C), temperature source Oral, resp. rate 16, height 5\' 3"  (1.6 m), weight 72.6 kg, SpO2 98 %.  I/O:    Intake/Output Summary (Last 24 hours) at 01/30/2019 1424 Last data filed at 01/30/2019 0351 Gross per 24 hour  Intake -  Output 550 ml  Net -550 ml    PHYSICAL EXAMINATION:  GENERAL:  63 y.o.-year-old patient lying in the bed with no acute distress.  EYES: Pupils equal, round, reactive to light and accommodation. No scleral icterus. Extraocular muscles intact.  HEENT: Head atraumatic, normocephalic. Oropharynx and nasopharynx clear.  NECK:  Supple, no jugular venous distention. No thyroid enlargement, no tenderness.  LUNGS: Normal breath sounds bilaterally, no wheezing, rales,rhonchi or crepitation. No use of accessory muscles of respiration.  CARDIOVASCULAR: S1, S2 normal. No murmurs, rubs, or gallops.  Reproducible chest pain on palpation the lateral part of the chest ABDOMEN: Soft, non-tender, non-distended. Bowel sounds present.  EXTREMITIES: No pedal  edema, cyanosis, or clubbing.  NEUROLOGIC: Cranial nerves II through XII are intact. Muscle strength 5/5 in all extremities. Sensation intact. Gait not checked.  PSYCHIATRIC: The patient is alert and oriented x 3.  SKIN: No obvious rash, lesion, or ulcer.   DATA REVIEW:   CBC Recent Labs  Lab 01/29/19 1047  WBC 10.4  HGB 9.9*  HCT 32.2*  PLT 265    Chemistries  Recent Labs  Lab 01/30/19 0429  NA 139  K 4.1  CL 108  CO2 22  GLUCOSE 100*  BUN 15  CREATININE 1.01*  CALCIUM 8.9  MG 2.1  Cardiac Enzymes No results for input(s): TROPONINI in the last 168 hours.  Microbiology Results  Results for orders placed or performed during the hospital encounter of 01/29/19  SARS Coronavirus 2 [facility] order, Performed in [facility] hospital lab) Nasopharyngeal Nasopharyngeal Swab     Status: None   Collection Time: 01/29/19  1:06 PM   Specimen: Nasopharyngeal Swab  Result Value Ref Range Status   SARS Coronavirus 2 NEGATIVE NEGATIVE Final    Comment: (NOTE) If result is NEGATIVE SARS-CoV-2 target nucleic acids are NOT DETECTED. The SARS-CoV-2 RNA is generally detectable in upper and lower  respiratory specimens during the acute phase of infection. The lowest  concentration of SARS-CoV-2 viral copies this assay can detect is 250  copies / mL. A negative result does not preclude SARS-CoV-2 infection  and should not be used as the sole basis for treatment or other  patient management decisions.  A negative result may occur with  improper specimen collection / handling, submission of specimen other  than nasopharyngeal swab, presence of viral mutation(s) within the  areas targeted by this assay, and inadequate number of viral copies  (<250 copies / mL). A negative result must be combined with clinical  observations, patient history, and epidemiological information. If result is POSITIVE SARS-CoV-2 target nucleic acids are DETECTED. The SARS-CoV-2 RNA is generally detectable  in upper and lower  respiratory specimens dur ing the acute phase of infection.  Positive  results are indicative of active infection with SARS-CoV-2.  Clinical  correlation with patient history and other diagnostic information is  necessary to determine patient infection status.  Positive results do  not rule out bacterial infection or co-infection with other viruses. If result is PRESUMPTIVE POSTIVE SARS-CoV-2 nucleic acids MAY BE PRESENT.   A presumptive positive result was obtained on the submitted specimen  and confirmed on repeat testing.  While 2019 novel coronavirus  (SARS-CoV-2) nucleic acids may be present in the submitted sample  additional confirmatory testing may be necessary for epidemiological  and / or clinical management purposes  to differentiate between  SARS-CoV-2 and other Sarbecovirus currently known to infect humans.  If clinically indicated additional testing with an alternate test  methodology [phone]) is advised. The SARS-CoV-2 RNA is generally  detectable in upper and lower respiratory sp ecimens during the acute  phase of infection. The expected result is Negative. Fact Sheet for Patients:  StrictlyIdeas.no Fact Sheet for Healthcare Providers: BankingDealers.co.za This test is not yet approved or cleared by the [place] FDA and has been authorized for detection and/or diagnosis of SARS-CoV-2 by FDA under an Emergency Use Authorization (EUA).  This EUA will remain in effect (meaning this test can be used) for the duration of the COVID-19 declaration under Section 564(b)(1) of the Act, 21 U.S.C. section 360bbb-3(b)(1), unless the authorization is terminated or revoked sooner. Performed at [facility], [address], [place], [place] [address]     RADIOLOGY:  Dg Chest 2 View  Result Date: 01/29/2019 CLINICAL DATA:  Chest pain.  Shortness of breath.  Palpitations. EXAM: CHEST - 2 VIEW  COMPARISON:  06/23/2018 FINDINGS: The heart size and mediastinal contours are within normal limits. Both lungs are clear. The visualized skeletal structures are unremarkable. IMPRESSION: Normal exam. Electronically Signed   By: Lorriane Shire M.D.   On: 01/29/2019 11:07   Ct Angio Chest Pe W Or Wo Contrast  Result Date: 01/30/2019 CLINICAL DATA:  Chest tightness and shortness of breath for 3 days. EXAM: CT ANGIOGRAPHY  CHEST WITH CONTRAST TECHNIQUE: Multidetector CT imaging of the chest was performed using the standard protocol during bolus administration of intravenous contrast. Multiplanar CT image reconstructions and MIPs were obtained to evaluate the vascular anatomy. CONTRAST:  75 mL OMNIPAQUE IOHEXOL 350 MG/ML SOLN COMPARISON:  PA and lateral chest earlier today. CT chest 01/29/2019. CT chest 06/01/2015. FINDINGS: Cardiovascular: Satisfactory opacification of the pulmonary arteries to the segmental level. No evidence of pulmonary embolism. Normal heart size. No pericardial effusion. Calcific aortic and coronary atherosclerosis noted. Mediastinum/Nodes: No enlarged mediastinal, hilar, or axillary lymph nodes. Thyroid gland, trachea, and esophagus demonstrate no significant findings. Lungs/Pleura: No pleural effusion. There is mild dependent atelectasis. 0.3 cm nodular opacity along the major fissure in the left upper on image 22. 0.2 cm nodular opacity right upper lobe on image 26 of series 5. Nodule is stable since 2017. Emphysematous change in the apices noted. Upper Abdomen: Negative. Musculoskeletal: Negative. Review of the MIP images confirms the above findings. IMPRESSION: Negative for pulmonary embolus. No acute disease or finding to explain the patient's symptoms. Aortic Atherosclerosis (ICD10-I70.0) and Emphysema (ICD10-J43.9). Electronically Signed   By: Drusilla Kannerhomas  Dalessio M.D.   On: 01/30/2019 13:51   Nm Myocar Multi W/spect W/wall Motion / Ef  Addendum Date: 01/30/2019   Pharmacological  myocardial perfusion imaging study with no significant  Ischemia Fixed defect in the anteroseptal, apical region, likely attenuation artifact/bundle branch block, though unable to exclude prior MI Anteroseptal wall hypokinesis, secondary to bundle branch block,  EF estimated at 40% No EKG changes concerning for ischemia at peak stress or in recovery. Baseline EKG with LBBB Low risk scan Study compared to prior study dated 10/2010, no significant change noted, prior defect detailed above secondary to LBBB was previously noted. Signed, Dossie Arbourim Gollan, MD, Ph.D [facility]   Result Date: 01/30/2019 Pharmacological myocardial perfusion imaging study with no significant  Ischemia Fixed defect in the anteroseptal, apical region, likely attenuation artifact/bundle branch block, though unable to exclude prior MI Anteroseptal wall hypokinesis, secondary to bundle branch block,  EF estimated at 40% No EKG changes concerning for ischemia at peak stress or in recovery. Baseline EKG with LBBB Low to moderate risk scan Signed, Dossie Arbourim Gollan, MD, Ph.D [facility]    EKG:   Orders placed or performed during the hospital encounter of 01/29/19  . EKG 12-Lead  . EKG 12-Lead  . ED EKG  . ED EKG      Management plans discussed with the patient, family and they are in agreement.  CODE STATUS:     Code Status Orders  (From admission, onward)         Start     Ordered   01/29/19 1419  Full code  Continuous     01/29/19 1418        Code Status History    This patient has a current code status but no historical code status.   Advance Care Planning Activity      TOTAL TIME TAKING CARE OF THIS PATIENT: 45 minutes.   Note: This dictation was prepared with Dragon dictation along with smaller phrase technology. Any transcriptional errors that result from this process are unintentional.   @[facility] @  on 01/30/2019 at 2:24 PM  Between 7am to 6pm - Pager - [phone]  After 6pm go to [email or web address] - password  [facility]  [facility]  Office  [phone]  CC: Primary care physician; Sherlene Shamsullo, Teresa L, MD

## 2019-01-30 NOTE — Progress Notes (Signed)
Patient discharged home. Education provided on instructions. Patient demonstrates understanding.

## 2019-01-30 NOTE — Discharge Instructions (Signed)
Follow-up with primary care physician in 3 to 4 days ° °

## 2019-01-31 LAB — HIV ANTIBODY (ROUTINE TESTING W REFLEX): HIV Screen 4th Generation wRfx: NONREACTIVE

## 2019-02-02 ENCOUNTER — Telehealth: Payer: Self-pay | Admitting: Internal Medicine

## 2019-02-03 ENCOUNTER — Telehealth: Payer: Self-pay | Admitting: Cardiovascular Disease

## 2019-02-06 NOTE — Telephone Encounter (Signed)
Virtual Visit Pre-Appointment Phone Call  "(Name), I am calling you today to discuss your upcoming appointment. We are currently trying to limit exposure to the virus that causes COVID-19 by seeing patients at home rather than in the office."  1. "What is the BEST phone number to call the day of the visit?" - include this in appointment notes  2. Do you have or have access to (through a family member/friend) a smartphone with video capability that we can use for your visit?" a. If yes - list this number in appt notes as cell (if different from BEST phone #) and list the appointment type as a VIDEO visit in appointment notes b. If no - list the appointment type as a PHONE visit in appointment notes  3. Confirm consent - "In the setting of the current Covid19 crisis, you are scheduled for a (phone or video) visit with your provider on (date) at (time).  Just as we do with many in-office visits, in order for you to participate in this visit, we must obtain consent.  If you'd like, I can send this to your mychart (if signed up) or email for you to review.  Otherwise, I can obtain your verbal consent now.  All virtual visits are billed to your insurance company just like a normal visit would be.  By agreeing to a virtual visit, we'd like you to understand that the technology does not allow for your provider to perform an examination, and thus may limit your provider's ability to fully assess your condition. If your provider identifies any concerns that need to be evaluated in person, we will make arrangements to do so.  Finally, though the technology is pretty good, we cannot assure that it will always work on either your or our end, and in the setting of a video visit, we may have to convert it to a phone-only visit.  In either situation, we cannot ensure that we have a secure connection.  Are you willing to proceed?" STAFF: Did the patient verbally acknowledge consent to telehealth visit? Document  YES/NO here: yes  4. Advise patient to be prepared - "Two hours prior to your appointment, go ahead and check your blood pressure, pulse, oxygen saturation, and your weight (if you have the equipment to check those) and write them all down. When your visit starts, your provider will ask you for this information. If you have an Apple Watch or Kardia device, please plan to have heart rate information ready on the day of your appointment. Please have a pen and paper handy nearby the day of the visit as well."  5. Give patient instructions for MyChart download to smartphone OR Doximity/Doxy.me as below if video visit (depending on what platform provider is using)  6. Inform patient they will receive a phone call 15 minutes prior to their appointment time (may be from unknown caller ID) so they should be prepared to answer    TELEPHONE CALL NOTE  Rashi Granier has been deemed a candidate for a follow-up tele-health visit to limit community exposure during the Covid-19 pandemic. I spoke with the patient via phone to ensure availability of phone/video source, confirm preferred email & phone number, and discuss instructions and expectations.  I reminded Ripley Lovecchio to be prepared with any vital sign and/or heart rhythm information that could potentially be obtained via home monitoring, at the time of her visit. I reminded Giuseppina Quinones to expect a phone call prior to  her visit.  Joline MaxcyJennifer B Harkins 02/06/2019 8:15 AM   INSTRUCTIONS FOR DOWNLOADING THE MYCHART APP TO SMARTPHONE  - The patient must first make sure to have activated MyChart and know their login information - If Apple, go to [employer] Store and type in MyChart in the search bar and download the app. If Android, ask patient to go to [employer] Play Store and type in [place] in the search bar and download the app. The app is free but as with any other app downloads, their phone may require them to verify saved payment information or  Apple/Android password.  - The patient will need to then log into the app with their MyChart username and password, and select [facility] as their healthcare provider to link the account. When it is time for your visit, go to the MyChart app, find appointments, and click Begin Video Visit. Be sure to Select Allow for your device to access the Microphone and Camera for your visit. You will then be connected, and your provider will be with you shortly.  **If they have any issues connecting, or need assistance please contact MyChart service desk [phone])**  **If using a computer, in order to ensure the best quality for their visit they will need to use either of the following Internet Browsers: [employer], or Google Chrome**  IF USING DOXIMITY or DOXY.ME - The patient will receive a link just prior to their visit by text.     FULL LENGTH CONSENT FOR TELE-HEALTH VISIT   I hereby voluntarily request, consent and authorize [facility] and its employed or contracted physicians, physician assistants, nurse practitioners or other licensed health care professionals (the Practitioner), to provide me with telemedicine health care services (the Services") as deemed necessary by the treating Practitioner. I acknowledge and consent to receive the Services by the Practitioner via telemedicine. I understand that the telemedicine visit will involve communicating with the Practitioner through live audiovisual communication technology and the disclosure of certain medical information by electronic transmission. I acknowledge that I have been given the opportunity to request an in-person assessment or other available alternative prior to the telemedicine visit and am voluntarily participating in the telemedicine visit.  I understand that I have the right to withhold or withdraw my consent to the use of telemedicine in the course of my care at any time, without affecting my right to future care  or treatment, and that the Practitioner or I may terminate the telemedicine visit at any time. I understand that I have the right to inspect all information obtained and/or recorded in the course of the telemedicine visit and may receive copies of available information for a reasonable fee.  I understand that some of the potential risks of receiving the Services via telemedicine include:   Delay or interruption in medical evaluation due to technological equipment failure or disruption;  Information transmitted may not be sufficient (e.g. poor resolution of images) to allow for appropriate medical decision making by the Practitioner; and/or   In rare instances, security protocols could fail, causing a breach of personal health information.  Furthermore, I acknowledge that it is my responsibility to provide information about my medical history, conditions and care that is complete and accurate to the best of my ability. I acknowledge that Practitioner's advice, recommendations, and/or decision may be based on factors not within their control, such as incomplete or inaccurate data provided by me or distortions of diagnostic images or specimens that may result from electronic transmissions. I  understand that the practice of medicine is not an exact science and that Practitioner makes no warranties or guarantees regarding treatment outcomes. I acknowledge that I will receive a copy of this consent concurrently upon execution via email to the email address I last provided but may also request a printed copy by calling the office of [facility].    I understand that my insurance will be billed for this visit.   I have read or had this consent read to me.  I understand the contents of this consent, which adequately explains the benefits and risks of the Services being provided via telemedicine.   I have been provided ample opportunity to ask questions regarding this consent and the Services and have had  my questions answered to my satisfaction.  I give my informed consent for the services to be provided through the use of telemedicine in my medical care  By participating in this telemedicine visit I agree to the above.

## 2019-02-13 ENCOUNTER — Ambulatory Visit (INDEPENDENT_AMBULATORY_CARE_PROVIDER_SITE_OTHER): Payer: BC Managed Care – PPO | Admitting: Internal Medicine

## 2019-02-13 ENCOUNTER — Other Ambulatory Visit: Payer: Self-pay

## 2019-02-13 ENCOUNTER — Encounter: Payer: Self-pay | Admitting: Internal Medicine

## 2019-02-13 ENCOUNTER — Ambulatory Visit: Payer: BC Managed Care – PPO | Admitting: Physician Assistant

## 2019-02-13 DIAGNOSIS — R232 Flushing: Secondary | ICD-10-CM | POA: Diagnosis not present

## 2019-02-13 DIAGNOSIS — M545 Low back pain, unspecified: Secondary | ICD-10-CM

## 2019-02-13 DIAGNOSIS — M19011 Primary osteoarthritis, right shoulder: Secondary | ICD-10-CM

## 2019-02-13 DIAGNOSIS — G8929 Other chronic pain: Secondary | ICD-10-CM

## 2019-02-13 DIAGNOSIS — Z09 Encounter for follow-up examination after completed treatment for conditions other than malignant neoplasm: Secondary | ICD-10-CM | POA: Diagnosis not present

## 2019-02-13 DIAGNOSIS — I739 Peripheral vascular disease, unspecified: Secondary | ICD-10-CM | POA: Diagnosis not present

## 2019-02-13 DIAGNOSIS — M19012 Primary osteoarthritis, left shoulder: Secondary | ICD-10-CM

## 2019-02-13 DIAGNOSIS — R0789 Other chest pain: Secondary | ICD-10-CM | POA: Diagnosis not present

## 2019-02-13 DIAGNOSIS — R5383 Other fatigue: Secondary | ICD-10-CM | POA: Diagnosis not present

## 2019-02-13 DIAGNOSIS — N951 Menopausal and female climacteric states: Secondary | ICD-10-CM | POA: Diagnosis not present

## 2019-02-13 MED ORDER — METOPROLOL SUCCINATE ER 25 MG PO TB24: 25.0000 mg | ORAL_TABLET | Freq: Every day | ORAL | 3 refills | Status: DC

## 2019-02-13 MED ORDER — HYDROCODONE-ACETAMINOPHEN 5-325 MG PO TABS: 1.0000 | ORAL_TABLET | Freq: Every day | ORAL | 0 refills | Status: DC | PRN

## 2019-02-13 NOTE — Progress Notes (Signed)
Virtual Visit via Doxy.me  This visit type was conducted due to national recommendations for restrictions regarding the COVID-19 pandemic (e.g. social distancing).  This format is felt to be most appropriate for this patient at this time.  All issues noted in this document were discussed and addressed.  No physical exam was performed (except for noted visual exam findings with Video Visits).   I connected with@ on 02/13/19 at  3:00 PM EDT by a video enabled telemedicine applicationand verified  that I am speaking with the correct person using two identifiers. Location patient: home Location provider: work or home office Persons participating in the virtual visit: patient, provider  I discussed the limitations, risks, security and privacy concerns of performing an evaluation and management service by telephone and the availability of in person appointments. I also discussed with the patient that there may be a patient responsible charge related to this service. The patient expressed understanding and agreed to proceed.  Reason for visit: hospital follow up  HPI:  63 yr old female with OSA, dyslipidemia, history of tobacco abuse and peripheral vascular disease (splenic artery aneurysm  [facility] and palpitations .  She was admitted on Sept 3 and discharged  To home on Sept 4 after she ruled out for AMI with ngeative cardiac enzymes and a nondiagnostic /negative myoview.  She has a chronic LBBB   [facility]  [facility] ,  Underwent a diagnostic Cardiac catheterization ([facility] and reviewed today: "EF 54%, normal RCA, L Main, L   Circumflex and LAD")  .    She has been Takes propranolol prn chest pain/palpitations and has been using ,  Up to 5-6 times daily since discharge.     ROS: See pertinent positives and negatives per HPI.  Past Medical History:  Diagnosis Date  . 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 Arrhythmia    left bundle branch block  . Arthritis   . Blood in stool   . Chicken pox   . Generalized headaches   . History of blood transfusion   . Kidney disease, chronic, stage III (GFR 30-59 ml/min) (HCC)   . LBBB (left bundle branch block)   . Renal disorder   . Thyroid disease   . UTI (lower urinary tract infection)     Past Surgical History:  Procedure Laterality Date  . ABDOMINAL HYSTERECTOMY  1997  . APPENDECTOMY  1981  . BREAST EXCISIONAL BIOPSY Bilateral 1976   neg  . BREAST SURGERY  1976  . CARDIAC CATHETERIZATION  2004   [facility]  . CARDIAC CATHETERIZATION  2006   [facility]  . cystic fibrosis tumor removal  1983  . THUMB AMPUTATION  1992   traumatic  . TONSILLECTOMY AND ADENOIDECTOMY  1964    Family History  Problem Relation Age of Onset  . Heart disease Mother   . Arthritis Mother   . Cancer Mother        breast  . Hyperlipidemia Mother   . Hypertension Mother   . Heart attack Mother   . Breast cancer Mother [id number]  . Heart disease Father   . Diabetes Father   . Arthritis Father   . Hypertension Father   . Parkinson's disease Father   . Hodgkin's lymphoma Father  hodgkins disease, prostate  . Heart disease Sister   . Diabetes Sister   . Heart disease Brother 4       ami x 8,  4 vessel CABG   . Diabetes Brother   . Liver disease Brother   . Lung disease Brother   . Heart disease Maternal Grandmother   . Diabetes Maternal Grandmother   . Heart disease Maternal Grandfather   . Heart disease Paternal Grandmother   . Diabetes Paternal Grandmother   . Stroke Paternal Grandfather   . Heart disease Paternal Grandfather   . Diabetes Paternal Grandfather   . Diabetes Maternal Aunt     SOCIAL HX:  reports that she quit smoking about 5 years ago. Her smoking use included cigarettes. She has a 8.50 pack-year smoking history.  She has never used smokeless tobacco. She reports current alcohol use. She reports that she does not use drugs.   Current Outpatient Medications:  .  albuterol (PROVENTIL HFA;VENTOLIN HFA) 108 (90 Base) MCG/ACT inhaler, Inhale 2 puffs into the lungs every 6 (six) hours as needed for wheezing or shortness of breath., Disp: 1 Inhaler, Rfl: 0 .  aspirin 325 MG tablet, Take 1 tablet (325 mg total) by mouth every 6 (six) hours as needed., Disp: , Rfl:  .  buPROPion (WELLBUTRIN SR) 100 MG 12 hr tablet, TAKE 1 TABLET BY MOUTH TWO  TIMES DAILY (Patient taking differently: Take 100 mg by mouth 2 (two) times daily. ), Disp: 180 tablet, Rfl: 3 .  Calcium Carbonate-Vit D-Min (CALCIUM 1200 PO), Take 1 tablet by mouth daily., Disp: , Rfl:  .  docusate sodium (COLACE) 100 MG capsule, Take 100 mg by mouth daily as needed for mild constipation., Disp: , Rfl:  .  gabapentin (NEURONTIN) 300 MG capsule, TAKE 1 CAPSULE BY MOUTH  EVERY NIGHT AT BEDTIME (Patient taking differently: Take 300 mg by mouth 2 (two) times daily. ), Disp: 90 capsule, Rfl: 1 .  hydrOXYzine (ATARAX/VISTARIL) 25 MG tablet, TAKE 1 TABLET (25 MG TOTAL) BY MOUTH 3 (THREE) TIMES DAILY AS NEEDED FOR ITCHING. (Patient taking differently: Take 25 mg by mouth 3 (three) times daily as needed for itching. ), Disp: 90 tablet, Rfl: 3 .  losartan (COZAAR) 50 MG tablet, Take 50 mg by mouth daily., Disp: , Rfl:  .  nitroGLYCERIN (NITROSTAT) 0.4 MG SL tablet, Place 1 tablet (0.4 mg total) under the tongue every 5 (five) minutes as needed for chest pain., Disp: 25 tablet, Rfl: 3 .  progesterone (PROMETRIUM) 200 MG capsule, Take 200 mg by mouth at bedtime. , Disp: , Rfl:  .  rOPINIRole (REQUIP) 0.25 MG tablet, 1-2 tablets at bedtime for restless legs (Patient taking differently: Take 0.25-0.5 mg by mouth at bedtime. ), Disp: 180 tablet, Rfl: 1 .  rosuvastatin (CRESTOR) 10 MG tablet, Take 1 tablet (10 mg total) by mouth daily., Disp: 90 tablet, Rfl: 3 .  tiZANidine  (ZANAFLEX) 2 MG tablet, Take 1 tablet (2 mg total) by mouth every 6 (six) hours as needed for muscle spasms., Disp: 30 tablet, Rfl: 0 .  traMADol (ULTRAM) 50 MG tablet, Take 1 tablet (50 mg total) by mouth 2 (two) times daily as needed. for pain, Disp: 60 tablet, Rfl: 3 .  venlafaxine XR (EFFEXOR-XR) 75 MG 24 hr capsule, TAKE 1 CAPSULE(75 MG) BY MOUTH DAILY WITH BREAKFAST (Patient taking differently: Take 75 mg by mouth daily with breakfast. ), Disp: 90 capsule, Rfl: 3 .  vitamin B-12 (CYANOCOBALAMIN) 1000 MCG tablet, Take 1,000  mcg by mouth daily., Disp: , Rfl:  .  vitamin C (ASCORBIC ACID) 500 MG tablet, Take 500 mg by mouth daily., Disp: , Rfl:  .  HYDROcodone-acetaminophen (NORCO/VICODIN) 5-325 MG tablet, Take 1 tablet by mouth daily as needed., Disp: 30 tablet, Rfl: 0 .  [START ON 03/15/2019] HYDROcodone-acetaminophen (NORCO/VICODIN) 5-325 MG tablet, Take 1 tablet by mouth daily as needed., Disp: 30 tablet, Rfl: 0 .  [START ON 04/14/2019] HYDROcodone-acetaminophen (NORCO/VICODIN) 5-325 MG tablet, Take 1 tablet by mouth daily as needed., Disp: 30 tablet, Rfl: 0 .  metoprolol succinate (TOPROL-XL) 25 MG 24 hr tablet, Take 1 tablet (25 mg total) by mouth daily., Disp: 90 tablet, Rfl: 3  EXAM:  VITALS per patient if applicable:  GENERAL: alert, oriented, appears well and in no acute distress  HEENT: atraumatic, conjunttiva clear, no obvious abnormalities on inspection of external nose and ears  NECK: normal movements of the head and neck  LUNGS: on inspection no signs of respiratory distress, breathing rate appears normal, no obvious gross SOB, gasping or wheezing  CV: no obvious cyanosis  MS: moves all visible extremities without noticeable abnormality  PSYCH/NEURO: pleasant and cooperative, no obvious depression or anxiety, speech and thought processing grossly intact  ASSESSMENT AND PLAN:  Discussed the following assessment and plan:  Osteoarthritis of both shoulders - Plan:  HYDROcodone-acetaminophen (NORCO/VICODIN) 5-325 MG tablet, HYDROcodone-acetaminophen (NORCO/VICODIN) 5-325 MG tablet, HYDROcodone-acetaminophen (NORCO/VICODIN) 5-325 MG tablet  PAD (peripheral artery disease) (HCC)  Other chest pain  Hospital discharge follow-up  Chronic low back pain without sciatica, unspecified back pain laterality  PAD (peripheral artery disease) ([place]) Continue aggressive management of blood pressure and cholesterol with losartan,  Toprol XL (prescribed today in lieu of frequent propranolol dosing) and Rosuvastatin  Lab Results  Component Value Date   CHOL 107 01/30/2019   HDL 27 (L) 01/30/2019   LDLCALC 47 01/30/2019   LDLDIRECT 104 (H) 08/15/2012   TRIG 164 (H) 01/30/2019   CHOLHDL 4.0 01/30/2019     Chest pain Noncardiac by recent evaluation.  Diagnostic cath from 2005 reviewed.  Normal coronaries. Consider adding PPI for empiric treatment of GERD if chest pain continues   Chronic lower back pain MRI of lumbar spine done in 2018 did not show spinal stenosis,   But her pain was not controlled with Celebrex and it raised her blood pressure.  Continue  use of vicodin once daily at bedtime only. Suspended  celebrex and continue tramadol for  daytime use.   Refill history confirmed via [place] Controlled Substance databas, accessed by me today.   Refills  Dated for  September, October and November , 2020    I discussed the assessment and treatment plan with the patient. The patient was provided an opportunity to ask questions and all were answered. The patient agreed with the plan and demonstrated an understanding of the instructions.   The patient was advised to call back or seek an in-person evaluation if the symptoms worsen or if the condition fails to improve as anticipated.  I provided  25 minutes of non-face-to-face time during this encounter reviewing patient's current problems and past surgeries and procedures .  Providing counseling on the above mentioned  problems , and coordination  of care .   Crecencio Mc, MD

## 2019-02-15 DIAGNOSIS — Z09 Encounter for follow-up examination after completed treatment for conditions other than malignant neoplasm: Secondary | ICD-10-CM | POA: Insufficient documentation

## 2019-02-15 NOTE — Assessment & Plan Note (Signed)
Noncardiac by recent evaluation.  Diagnostic cath from 2005 reviewed.  Normal coronaries. Consider adding PPI for empiric treatment of GERD if chest pain continues

## 2019-02-15 NOTE — Assessment & Plan Note (Signed)
MRI of lumbar spine done in 2018 did not show spinal stenosis,   But her pain was not controlled with Celebrex and it raised her blood pressure.  Continue  use of vicodin once daily at bedtime only. Suspended  celebrex and continue tramadol for  daytime use.   Refill history confirmed via [place] Controlled Substance databas, accessed by me today.   Refills  Dated for  September, October and November , 2020

## 2019-02-15 NOTE — Assessment & Plan Note (Signed)
Continue aggressive management of blood pressure and cholesterol with losartan,  Toprol XL (prescribed today in lieu of frequent propranolol dosing) and Rosuvastatin  Lab Results  Component Value Date   CHOL 107 01/30/2019   HDL 27 (L) 01/30/2019   LDLCALC 47 01/30/2019   LDLDIRECT 104 (H) 08/15/2012   TRIG 164 (H) 01/30/2019   CHOLHDL 4.0 01/30/2019

## 2019-02-17 NOTE — Progress Notes (Signed)
Virtual Visit via Video Note   This visit type was conducted due to national recommendations for restrictions regarding the COVID-19 Pandemic (e.g. social distancing) in an effort to limit this patient's exposure and mitigate transmission in our community.  Due to her co-morbid illnesses, this patient is at least at moderate risk for complications without adequate follow up.  This format is felt to be most appropriate for this patient at this time.  All issues noted in this document were discussed and addressed.  A limited physical exam was performed with this format.  Please refer to the patient's chart for her consent to telehealth for [facility].   I connected with  Cheryl [name] on 02/18/19 by a video enabled telemedicine application and verified that I am speaking with the correct person using two identifiers. I discussed the limitations of evaluation and management by telemedicine. The patient expressed understanding and agreed to proceed.   Evaluation Performed:  Follow-up visit  Date:  02/18/2019   ID:  Cheryl [name], Cheryl [name] 1955/06/08, MRN [id number]  Patient Location:  [place] [address]   Provider location:   [facility], [place] office  PCP:  Crecencio Mc, MD  Cardiologist:  Patsy Baltimore   Chief Complaint:  Chest pain , palpittions   History of Present Illness:    Cheryl [name] is a 63 y.o. female who presents via audio/video conferencing for a telehealth visit today.   The patient does not symptoms concerning for COVID-19 infection (fever, chills, cough, or new SHORTNESS OF BREATH).   Patient has a past medical history of left bundle branch block,  41 year smoking history,  sleep apnea, has chronic fatigue, compliant with her CPAP Previous stress testing, cardiac catheterization at [facility] with reportedly no significant CAD at that time in 2003, additional episodes of palpitations and chest pain,  Prior episodes  of chest pain,workup in June 2012 with stress testing/Myoview showing no ischemia  Moderate aortic atherosclerosis  Rare episodes of tachycardia, once per month. Prior chest pain symptoms felt to be atypical in nature. Strong family history of CAD who presents for follow-up of her palpitations, PAD.  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 Baseline EKG with LBBB Low risk scan Study compared to prior study dated 10/2010, no significant change noted, prior defect detailed above secondary to LBBB was previously noted.  Just started on metoprolol succinate 25  labs reviewed Total chol 107, LD 47 CR 1.01  Still smoking No desire to quit at this time  Working outside in yard    chest CT scan earlier in 2019 reviewed with her showing three-vessel coronary calcification, mild aortic arch atherosclerosis  Denies any tachycardia, has not been taking propranolol No recent lipid panel, possibly one at work Thinks it is good but does not know the details Prior LDL 98 Does not really want a cholesterol medication  No regular exercise program, weight has been trending upwards   ultrasound  March 2018 by Dr. 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  Stress test 11/10/2010 showing no ischemia. Myoview study Outside echocardiogram 11/08/2010 showing normal ejection fraction 60%   Prior CV studies:    The following studies were reviewed today:    Past Medical History:  Diagnosis Date  . 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 Arrhythmia    left bundle branch block  . Arthritis   . Blood in stool   . Chicken pox   . Generalized headaches   . History of blood transfusion   . Kidney disease, chronic, stage III (GFR 30-59 ml/min) (HCC)   . LBBB (left bundle branch block)   . Renal disorder   . Thyroid disease   . UTI (lower urinary tract infection)    Past Surgical History:  Procedure Laterality Date  . ABDOMINAL HYSTERECTOMY  1997  . APPENDECTOMY  1981  . BREAST EXCISIONAL BIOPSY Bilateral 1976   neg  . BREAST SURGERY  1976  . CARDIAC CATHETERIZATION  2004   [facility]  . CARDIAC CATHETERIZATION  2006   [facility]  . cystic fibrosis tumor removal  1983  . THUMB AMPUTATION  1992   traumatic  . TONSILLECTOMY AND ADENOIDECTOMY  1964      Allergies:   Peanuts [peanut oil], Penicillins, Sulfa antibiotics, Influenza vac split [flu virus vaccine], Mobic [meloxicam], and Nickel   Social History   Tobacco Use  . Smoking status: Former Smoker    Packs/day: 0.25    Years: 34.00    Pack years: 8.50    Types: Cigarettes    Quit date: 04/10/2013    Years since quitting: 5.8  . Smokeless tobacco: Never Used  Substance Use Topics  . Alcohol use: Yes    Comment: occasional  . Drug use: No     Current Outpatient Medications on File Prior to Visit  Medication Sig Dispense Refill  . albuterol (PROVENTIL HFA;VENTOLIN HFA) 108 (90 Base) MCG/ACT inhaler Inhale 2 puffs into the lungs every 6 (six) hours as needed for wheezing or shortness of breath. 1 Inhaler 0  . aspirin 325 MG tablet Take 1 tablet (325 mg total) by mouth every 6 (six) hours as needed.    [name] buPROPion (WELLBUTRIN SR) 100 MG 12 hr tablet TAKE 1 TABLET BY MOUTH TWO  TIMES DAILY (Patient taking  differently: Take 100 mg by mouth 2 (two) times daily. ) 180 tablet 3  . Calcium Carbonate-Vit D-Min (CALCIUM 1200 PO) Take 1 tablet by mouth daily.    [name] docusate sodium (COLACE) 100 MG capsule Take 100 mg by mouth daily as needed for mild constipation.    . gabapentin (NEURONTIN) 300 MG capsule TAKE 1 CAPSULE BY MOUTH  EVERY NIGHT AT BEDTIME (Patient taking differently: Take 300 mg by mouth 2 (two) times daily. ) 90 capsule 1  . [START ON 04/14/2019] HYDROcodone-acetaminophen (NORCO/VICODIN) 5-325 MG tablet Take 1 tablet by mouth daily as needed. 30 tablet 0  . hydrOXYzine (ATARAX/VISTARIL) 25 MG tablet TAKE 1 TABLET (25 MG TOTAL) BY MOUTH 3 (THREE) TIMES DAILY AS NEEDED FOR ITCHING. (Patient taking differently: Take 25 mg by mouth 3 (three) times daily as needed for itching. ) 90 tablet 3  . losartan (COZAAR) 50 MG tablet Take 50 mg by mouth daily.    . metoprolol succinate (TOPROL-XL) 25 MG 24 hr tablet Take 1 tablet (25 mg total)  by mouth daily. 90 tablet 3  . nitroGLYCERIN (NITROSTAT) 0.4 MG SL tablet Place 1 tablet (0.4 mg total) under the tongue every 5 (five) minutes as needed for chest pain. 25 tablet 3  . progesterone (PROMETRIUM) 200 MG capsule Take 200 mg by mouth at bedtime.     . propranolol (INDERAL) 10 MG tablet Take 10 mg by mouth 3 (three) times daily as needed.    [name]. rOPINIRole (REQUIP) 0.25 MG tablet 1-2 tablets at bedtime for restless legs (Patient taking differently: Take 0.25-0.5 mg by mouth at bedtime. ) 180 tablet 1  . rosuvastatin (CRESTOR) 10 MG tablet Take 1 tablet (10 mg total) by mouth daily. 90 tablet 3  . tiZANidine (ZANAFLEX) 2 MG tablet Take 1 tablet (2 mg total) by mouth every 6 (six) hours as needed for muscle spasms. 30 tablet 0  . traMADol (ULTRAM) 50 MG tablet Take 1 tablet (50 mg total) by mouth 2 (two) times daily as needed. for pain 60 tablet 3  . venlafaxine XR (EFFEXOR-XR) 75 MG 24 hr capsule TAKE 1 CAPSULE(75 MG) BY MOUTH DAILY WITH BREAKFAST (Patient taking  differently: Take 75 mg by mouth daily with breakfast. ) 90 capsule 3  . vitamin B-12 (CYANOCOBALAMIN) 1000 MCG tablet Take 1,000 mcg by mouth daily.    . vitamin C (ASCORBIC ACID) 500 MG tablet Take 500 mg by mouth daily.    [name]. HYDROcodone-acetaminophen (NORCO/VICODIN) 5-325 MG tablet Take 1 tablet by mouth daily as needed. (Patient not taking: Reported on 02/18/2019) 30 tablet 0  . [START ON 03/15/2019] HYDROcodone-acetaminophen (NORCO/VICODIN) 5-325 MG tablet Take 1 tablet by mouth daily as needed. (Patient not taking: Reported on 02/18/2019) 30 tablet 0   No current facility-administered medications on file prior to visit.      Family Hx: The patient's family history includes Arthritis in her father and mother; Breast cancer (age of onset: 3544) in her mother; Cancer in her mother; Diabetes in her brother, father, maternal aunt, maternal grandmother, paternal grandfather, paternal grandmother, and sister; Heart attack in her mother; Heart disease in her father, maternal grandfather, maternal grandmother, mother, paternal grandfather, paternal grandmother, and sister; Heart disease (age of onset: 8359) in her brother; Hodgkin's lymphoma in her father; Hyperlipidemia in her mother; Hypertension in her father and mother; Liver disease in her brother; Lung disease in her brother; Parkinson's disease in her father; Stroke in her paternal grandfather.  ROS:   Please see the history of present illness.    Review of Systems  Constitutional: Positive for weight loss.  HENT: Negative.   Respiratory: Negative.   Cardiovascular: Positive for palpitations.  Gastrointestinal: Negative.   Musculoskeletal: Negative.   Neurological: Negative.   Psychiatric/Behavioral: Negative.   All other systems reviewed and are negative.     Labs/Other Tests and Data Reviewed:    Recent Labs: 12/26/2018: ALT 14 01/29/2019: Hemoglobin 9.9; Platelets 265; TSH 1.473 01/30/2019: BUN 15; Creatinine, Ser 1.01; Magnesium 2.1;  Potassium 4.1; Sodium 139   Recent Lipid Panel Lab Results  Component Value Date/Time   CHOL 107 01/30/2019 04:29 AM   CHOL 99 (L) 06/13/2018 08:29 AM   TRIG 164 (H) 01/30/2019 04:29 AM   HDL 27 (L) 01/30/2019 04:29 AM   HDL 34 (L) 06/13/2018 08:29 AM   CHOLHDL 4.0 01/30/2019 04:29 AM   LDLCALC 47 01/30/2019 04:29 AM   LDLCALC 38 06/13/2018 08:29 AM   LDLDIRECT 104 (H) 08/15/2012 04:09 PM    Wt Readings from Last 3 Encounters:  02/18/19  160 lb (72.6 kg)  02/13/19 160 lb (72.6 kg)  01/30/19 160 lb (72.6 kg)     Exam:    Vital Signs: Vital signs may also be detailed in the HPI BP 118/72   Pulse 78   Ht 5\' 3"  (1.6 m)   Wt 160 lb (72.6 kg)   BMI 28.34 kg/m   Wt Readings from Last 3 Encounters:  02/18/19 160 lb (72.6 kg)  02/13/19 160 lb (72.6 kg)  01/30/19 160 lb (72.6 kg)   Temp Readings from Last 3 Encounters:  01/30/19 98.4 F (36.9 C) (Oral)  01/13/19 98.2 F (36.8 C)  01/02/19 (!) 97.3 F (36.3 C)   BP Readings from Last 3 Encounters:  02/18/19 118/72  02/13/19 118/70  01/30/19 121/82   Pulse Readings from Last 3 Encounters:  02/18/19 78  02/13/19 78  01/30/19 81      Well nourished, well developed female in no acute distress. Constitutional:  oriented to person, place, and time. No distress.  Head: Normocephalic and atraumatic.  Eyes:  no discharge. No scleral icterus.  Neck: Normal range of motion. Neck supple.  Pulmonary/Chest: No audible wheezing, no distress, appears comfortable Musculoskeletal: Normal range of motion.  no  tenderness or deformity.  Neurological:   Coordination normal. Full exam not performed Skin:  No rash Psychiatric:  normal mood and affect. behavior is normal. Thought content normal.    ASSESSMENT & PLAN:    Problem List Items Addressed This Visit      Cardiology Problems   PAD (peripheral artery disease) (HCC)   Relevant Medications   propranolol (INDERAL) 10 MG tablet   Hyperlipidemia   Relevant Medications    propranolol (INDERAL) 10 MG tablet     Other   History of tobacco abuse    Other Visit Diagnoses    Aortic atherosclerosis (HCC)    -  Primary   Relevant Medications   propranolol (INDERAL) 10 MG tablet   Celiac artery stenosis (HCC)       Relevant Medications   propranolol (INDERAL) 10 MG tablet     Mixed hyperlipidemia Stay on crestor Cholesterol is at goal on the current lipid regimen. No changes to the medications were made.  History of tobacco abuse We have encouraged her to continue to work on weaning her cigarettes and smoking cessation. She will continue to work on this and does not want any assistance with chantix.  Sleep apnea, obstructive Compliant with her CPAP  Aortic atherosclerosis (HCC) She has coronary calcification, aortic atherosclerosis, images pulled up and discussed with her Nondiabetic but she is smoking Discussed with her  Celiac artery stenosis (HCC)  On CT scan,Ostium of the celiac artery and SMA does not have significant calcification and appears patent   Palpitations Started on metoprolol succinate 25 mg daily propranolol as needed Talked about ZIo monitor, will wait for now  COVID-19 Education: The signs and symptoms of COVID-19 were discussed with the patient and how to seek care for testing (follow up with PCP or arrange E-visit).  The importance of social distancing was discussed today.  Patient Risk:   After full review of this patients clinical status, I feel that they are at least moderate risk at this time.  Time:   Today, I have spent 25 minutes with the patient with telehealth technology discussing the cardiac and medical problems/diagnoses detailed above   Additional 10 min spent reviewing the chart prior to patient visit today   Medication Adjustments/Labs and Tests Ordered: Current  medicines are reviewed at length with the patient today.  Concerns regarding medicines are outlined above.   Tests Ordered: No tests  ordered   Medication Changes: No changes made   Disposition: Follow-up in 12 months   Signed, Julien Nordmann, MD  [facility] [address], [place], [place] [address]

## 2019-02-18 ENCOUNTER — Encounter: Payer: Self-pay | Admitting: Cardiovascular Disease

## 2019-02-18 ENCOUNTER — Other Ambulatory Visit: Payer: Self-pay

## 2019-02-18 ENCOUNTER — Telehealth (INDEPENDENT_AMBULATORY_CARE_PROVIDER_SITE_OTHER): Payer: BC Managed Care – PPO | Admitting: Cardiovascular Disease

## 2019-02-18 VITALS — BP 118/72 | HR 78 | Ht 63.0 in | Wt 160.0 lb

## 2019-02-18 DIAGNOSIS — I700 Atherosclerosis of aorta: Secondary | ICD-10-CM | POA: Diagnosis not present

## 2019-02-18 DIAGNOSIS — Z87891 Personal history of nicotine dependence: Secondary | ICD-10-CM | POA: Diagnosis not present

## 2019-02-18 DIAGNOSIS — I739 Peripheral vascular disease, unspecified: Secondary | ICD-10-CM

## 2019-02-18 DIAGNOSIS — I774 Celiac artery compression syndrome: Secondary | ICD-10-CM | POA: Diagnosis not present

## 2019-02-18 DIAGNOSIS — E782 Mixed hyperlipidemia: Secondary | ICD-10-CM

## 2019-02-18 DIAGNOSIS — I771 Stricture of artery: Secondary | ICD-10-CM

## 2019-02-18 MED ORDER — ASPIRIN EC 81 MG PO TBEC: 81.0000 mg | DELAYED_RELEASE_TABLET | Freq: Every day | ORAL | Status: DC

## 2019-02-18 MED ORDER — NITROGLYCERIN 0.4 MG SL SUBL: 0.4000 mg | SUBLINGUAL_TABLET | SUBLINGUAL | 3 refills | Status: DC | PRN

## 2019-02-18 MED ORDER — PROPRANOLOL HCL 10 MG PO TABS: 10.0000 mg | ORAL_TABLET | Freq: Three times a day (TID) | ORAL | 1 refills | Status: DC | PRN

## 2019-02-18 NOTE — Patient Instructions (Signed)
Medication Instructions:  Taking asa 81 mg daily Need to update list   NTG SL 0.4 mg PRN for chest pain Refill on propranopol 10 mg TID PRN palpitations  If you need a refill on your cardiac medications before your next appointment, please call your pharmacy.    Lab work: No new labs needed   If you have labs (blood work) drawn today and your tests are completely normal, you will receive your results only by: [name] MyChart Message (if you have MyChart) OR . A paper copy in the mail If you have any lab test that is abnormal or we need to change your treatment, we will call you to review the results.   Testing/Procedures: No new testing needed   Follow-Up: At [facility], you and your health needs are our priority.  As part of our continuing mission to provide you with exceptional heart care, we have created designated Provider Care Teams.  These Care Teams include your primary Cardiologist (physician) and Advanced Practice Providers (APPs -  Physician Assistants and Nurse Practitioners) who all work together to provide you with the care you need, when you need it.  . You will need a follow up appointment in 12 months .   Please call our office 2 months in advance to schedule this appointment.    . Providers on your designated Care Team:   . Murray Hodgkins, NP . Christell Faith, PA-C . Marrianne Mood, PA-C  Any Other Special Instructions Will Be Listed Below (If Applicable).  For educational health videos Log in to : [email or web address] Or : [email or web address], password : [email or web address]

## 2019-02-26 ENCOUNTER — Other Ambulatory Visit: Payer: BC Managed Care – PPO

## 2019-02-26 ENCOUNTER — Ambulatory Visit: Payer: BC Managed Care – PPO

## 2019-04-10 ENCOUNTER — Ambulatory Visit: Payer: BC Managed Care – PPO | Admitting: Family Medicine

## 2019-04-20 NOTE — Progress Notes (Signed)
[facility], [place] [address] Phone: [phone] Subjective:   Cheryl [name], am serving as a scribe for Dr. Hulan Saas.   CC: Neck and shoulder pain  [id number]   12/30/2017 Known arthritic changes, radicular symptoms in the left arm.  Mild weakness noted as well which is worsening.  New findings.  Deep tendon reflexes is even lower as well.  Discussed icing regimen, home exercises, which activities to do which wants to avoid.  I believe the patient would be a candidate for possible epidurals.  Follow-up again in 4 weeks after the epidural  Epidural 03/03/2018  Update 04/21/2019 Cheryl [name] is a 63 y.o. female coming in with complaint of neck and shoulder pain. Patient states that she continues to have pain throughout the day. Would like refill on gabapentin 300mg . States that she did not have any relief from the epidural.      Past Medical History:  Diagnosis Date  . 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 Arrhythmia    left bundle branch block  . Arthritis   . Blood in stool   . Chicken pox   . Generalized headaches   . History of blood transfusion   . Kidney disease, chronic, stage III (GFR 30-59 ml/min) (HCC)   . LBBB (left bundle branch block)   . Renal disorder   . Thyroid disease   . UTI (lower urinary tract infection)    Past Surgical History:  Procedure Laterality Date  . ABDOMINAL HYSTERECTOMY  1997  . APPENDECTOMY  1981  . BREAST EXCISIONAL BIOPSY Bilateral 1976   neg  . BREAST SURGERY  1976  . CARDIAC CATHETERIZATION  2004   [facility]  . CARDIAC CATHETERIZATION  2006   [facility]  . cystic fibrosis tumor removal  1983  . THUMB AMPUTATION  1992   traumatic  . TONSILLECTOMY AND ADENOIDECTOMY  1964   Social History   Socioeconomic History  . Marital status: Married     Spouse name: Not on file  . Number of children: Not on file  . Years of education: Not on file  . Highest education level: Not on file  Occupational History  . Not on file  Social Needs  . Financial resource strain: Not on file  . Food insecurity    Worry: Not on file    Inability: Not on file  . Transportation needs    Medical: Not on file    Non-medical: Not on file  Tobacco Use  . Smoking status: Former Smoker    Packs/day: 0.25    Years: 34.00    Pack years: 8.50    Types: Cigarettes    Quit date: 04/10/2013    Years since quitting: 6.0  . Smokeless tobacco: Never Used  Substance and Sexual Activity  . Alcohol use: Yes    Comment: occasional  . Drug use: [name]  . Sexual activity: Not on file  Lifestyle  . Physical activity    Days per week: Not on file    Minutes per session: Not on file  . Stress: Not on file  Relationships  . Social Herbalist on phone: Not on file    Gets together: Not on file    Attends religious service: Not on file    Active member of club or organization: Not on  file    Attends meetings of clubs or organizations: Not on file    Relationship status: Not on file  Other Topics Concern  . Not on file  Social History Narrative   Married to [employer]; Works for [employer], Engineer, structural. Quit smoking 2018; ocassional alcohol; lives near to [place].    Allergies  Allergen Reactions  . Peanuts [Peanut Oil]   . Penicillins Other (See Comments)    Hives,rash,nausea,swelling,   . Sulfa Antibiotics Other (See Comments)    Hives,rash,nausea,swelling  . Influenza Vac Split [Flu Virus Vaccine]     Pleurisy  1996  . Mobic [Meloxicam] Nausea Only  . Nickel    Family History  Problem Relation Age of Onset  . Heart disease Mother   . Arthritis Mother   . Cancer Mother        breast  . Hyperlipidemia Mother   . Hypertension Mother   . Heart attack Mother   . Breast cancer Mother 9  . Heart disease Father   . Diabetes  Father   . Arthritis Father   . Hypertension Father   . Parkinson's disease Father   . Hodgkin's lymphoma Father        hodgkins disease, prostate  . Heart disease Sister   . Diabetes Sister   . Heart disease Brother 39       ami x 8,  4 vessel CABG   . Diabetes Brother   . Liver disease Brother   . Lung disease Brother   . Heart disease Maternal Grandmother   . Diabetes Maternal Grandmother   . Heart disease Maternal Grandfather   . Heart disease Paternal Grandmother   . Diabetes Paternal Grandmother   . Stroke Paternal Grandfather   . Heart disease Paternal Grandfather   . Diabetes Paternal Grandfather   . Diabetes Maternal Aunt     Current Outpatient Medications (Endocrine & Metabolic):  .  progesterone (PROMETRIUM) 200 MG capsule, Take 200 mg by mouth at bedtime.   Current Outpatient Medications (Cardiovascular):  .  losartan (COZAAR) 50 MG tablet, Take 50 mg by mouth daily. .  metoprolol succinate (TOPROL-XL) 25 MG 24 hr tablet, Take 1 tablet (25 mg total) by mouth daily. .  nitroGLYCERIN (NITROSTAT) 0.4 MG SL tablet, Place 1 tablet (0.4 mg total) under the tongue every 5 (five) minutes as needed for chest pain. [name]  propranolol (INDERAL) 10 MG tablet, Take 1 tablet (10 mg total) by mouth 3 (three) times daily as needed. .  rosuvastatin (CRESTOR) 10 MG tablet, Take 1 tablet (10 mg total) by mouth daily.  Current Outpatient Medications (Respiratory):  .  albuterol (PROVENTIL HFA;VENTOLIN HFA) 108 (90 Base) MCG/ACT inhaler, Inhale 2 puffs into the lungs every 6 (six) hours as needed for wheezing or shortness of breath.  Current Outpatient Medications (Analgesics):  .  aspirin EC 81 MG tablet, Take 1 tablet (81 mg total) by mouth daily. [name]  HYDROcodone-acetaminophen (NORCO/VICODIN) 5-325 MG tablet, Take 1 tablet by mouth daily as needed. (Patient not taking: Reported on 02/18/2019) .  HYDROcodone-acetaminophen (NORCO/VICODIN) 5-325 MG tablet, Take 1 tablet by mouth daily as  needed. (Patient not taking: Reported on 02/18/2019) .  HYDROcodone-acetaminophen (NORCO/VICODIN) 5-325 MG tablet, Take 1 tablet by mouth daily as needed. .  traMADol (ULTRAM) 50 MG tablet, Take 1 tablet (50 mg total) by mouth 2 (two) times daily as needed. for pain  Current Outpatient Medications (Hematological):  .  vitamin B-12 (CYANOCOBALAMIN) 1000 MCG tablet, Take 1,000 mcg by  mouth daily.  Current Outpatient Medications (Other):  [name].  buPROPion (WELLBUTRIN SR) 100 MG 12 hr tablet, TAKE 1 TABLET BY MOUTH TWO  TIMES DAILY (Patient taking differently: Take 100 mg by mouth 2 (two) times daily. ) .  Calcium Carbonate-Vit D-Min (CALCIUM 1200 PO), Take 1 tablet by mouth daily. [name].  docusate sodium (COLACE) 100 MG capsule, Take 100 mg by mouth daily as needed for mild constipation. .  gabapentin (NEURONTIN) 300 MG capsule, TAKE 1 CAPSULE BY MOUTH  EVERY NIGHT AT BEDTIME (Patient taking differently: Take 300 mg by mouth 2 (two) times daily. ) .  hydrOXYzine (ATARAX/VISTARIL) 25 MG tablet, TAKE 1 TABLET (25 MG TOTAL) BY MOUTH 3 (THREE) TIMES DAILY AS NEEDED FOR ITCHING. (Patient taking differently: Take 25 mg by mouth 3 (three) times daily as needed for itching. ) .  rOPINIRole (REQUIP) 0.25 MG tablet, 1-2 tablets at bedtime for restless legs (Patient taking differently: Take 0.25-0.5 mg by mouth at bedtime. ) .  tiZANidine (ZANAFLEX) 2 MG tablet, Take 1 tablet (2 mg total) by mouth every 6 (six) hours as needed for muscle spasms. [name].  venlafaxine XR (EFFEXOR-XR) 75 MG 24 hr capsule, TAKE 1 CAPSULE(75 MG) BY MOUTH DAILY WITH BREAKFAST (Patient taking differently: Take 75 mg by mouth daily with breakfast. ) .  vitamin C (ASCORBIC ACID) 500 MG tablet, Take 500 mg by mouth daily.    Past medical history, social, surgical and family history all reviewed in electronic medical record.  [name] pertanent information unless stated regarding to the chief complaint.   Review of Systems:  [name] headache, visual changes,  nausea, vomiting, diarrhea, constipation, dizziness, abdominal pain, skin rash, fevers, chills, night sweats, weight loss, swollen lymph nodes, body aches, joint swelling, muscle aches, chest pain, shortness of breath, mood changes.   Objective  There were [name] vitals taken for this visit. Systems examined below as of    General: [name] apparent distress alert and oriented x3 mood and affect normal, dressed appropriately.  HEENT: Pupils equal, extraocular movements intact  Respiratory: Patient's speak in full sentences and does not appear short of breath  Cardiovascular: [name] lower extremity edema, non tender, [name] erythema  Skin: Warm dry intact with [name] signs of infection or rash on extremities or on axial skeleton.  Abdomen: Soft nontender  Neuro: Cranial nerves II through XII are intact, neurovascularly intact in all extremities with 2+ DTRs and 2+ pulses.  Lymph: [name] lymphadenopathy of posterior or anterior cervical chain or axillae bilaterally.  Gait normal with good balance and coordination.  MSK:  Non tender with full range of motion and good stability and symmetric strength and tone of  elbows, wrist, hip, knee and ankles bilaterally.  Neck: Inspection loss of lordosis. [name] palpable stepoffs. Mild positive Spurling's maneuver. Limited range of motion in all planes lacking last 5 to 10 degrees sidebending and extension. Grip strength and sensation normal in bilateral hands Strength good C4 to T1 distribution [name] sensory change to C4 to T1 Negative Hoffman sign bilaterally Reflexes normal Tightness in the parascapular region  Osteopathic findings  C7 flexed rotated and side bent left T4 extended rotated and side bent left L1 flexed rotated and side bent right Sacrum right on right       Impression and Recommendations:     This case required medical decision making of moderate complexity. The above documentation has been reviewed and is accurate and complete Judi SaaZachary M Smith, DO        Note: This dictation  was prepared with Dragon dictation along with smaller phrase technology. Any transcriptional errors that result from this process are unintentional.

## 2019-04-21 ENCOUNTER — Encounter: Payer: Self-pay | Admitting: Family Medicine

## 2019-04-21 ENCOUNTER — Other Ambulatory Visit: Payer: Self-pay

## 2019-04-21 ENCOUNTER — Ambulatory Visit: Payer: BC Managed Care – PPO | Admitting: Family Medicine

## 2019-04-21 VITALS — BP 130/78 | HR 89 | Ht 63.0 in | Wt 160.0 lb

## 2019-04-21 DIAGNOSIS — M999 Biomechanical lesion, unspecified: Secondary | ICD-10-CM | POA: Diagnosis not present

## 2019-04-21 DIAGNOSIS — M5412 Radiculopathy, cervical region: Secondary | ICD-10-CM

## 2019-04-21 MED ORDER — GABAPENTIN 300 MG PO CAPS: 300.0000 mg | ORAL_CAPSULE | Freq: Three times a day (TID) | ORAL | 0 refills | Status: DC

## 2019-04-21 NOTE — Assessment & Plan Note (Signed)
Right-sided cervical radiculopathy.  We discussed with patient in great length about icing regimen, home exercises, which activities to do which wants to avoid.  Patient is to increase activity slowly over the course of next several days.  Patient responded fairly well to osteopathic manipulation.  Patient declined any more epidurals of the neck at the moment.  Did not feel like they were beneficial.  Patient will follow up with me again in 6 to 8 weeks.

## 2019-04-21 NOTE — Assessment & Plan Note (Signed)
Decision today to treat with OMT was based on Physical Exam  After verbal consent patient was treated with HVLA, ME, FPR techniques in cervical, thoracic, lumbar and sacral areas  Patient tolerated the procedure well with improvement in symptoms  Patient given exercises, stretches and lifestyle modifications  See medications in patient instructions if given  Patient will follow up in 6-8 weeks 

## 2019-04-21 NOTE — Patient Instructions (Addendum)
  Gabapentin 300mg  3x a day  See me again in 8 weeks

## 2019-05-05 ENCOUNTER — Encounter: Payer: Self-pay | Admitting: Internal Medicine

## 2019-05-05 ENCOUNTER — Other Ambulatory Visit: Payer: Self-pay

## 2019-05-05 ENCOUNTER — Ambulatory Visit (INDEPENDENT_AMBULATORY_CARE_PROVIDER_SITE_OTHER): Payer: BC Managed Care – PPO | Admitting: Internal Medicine

## 2019-05-05 DIAGNOSIS — M5412 Radiculopathy, cervical region: Secondary | ICD-10-CM

## 2019-05-05 DIAGNOSIS — G2581 Restless legs syndrome: Secondary | ICD-10-CM | POA: Diagnosis not present

## 2019-05-05 DIAGNOSIS — M19011 Primary osteoarthritis, right shoulder: Secondary | ICD-10-CM

## 2019-05-05 DIAGNOSIS — G8929 Other chronic pain: Secondary | ICD-10-CM

## 2019-05-05 DIAGNOSIS — M19012 Primary osteoarthritis, left shoulder: Secondary | ICD-10-CM

## 2019-05-05 DIAGNOSIS — M545 Low back pain, unspecified: Secondary | ICD-10-CM

## 2019-05-05 MED ORDER — HYDROCODONE-ACETAMINOPHEN 5-325 MG PO TABS: 1.0000 | ORAL_TABLET | Freq: Every day | ORAL | 0 refills | Status: DC | PRN

## 2019-05-05 MED ORDER — ROPINIROLE HCL 0.5 MG PO TABS: ORAL_TABLET | ORAL | 2 refills | Status: DC

## 2019-05-05 NOTE — Assessment & Plan Note (Signed)
Symptoms occurring at night only.  Managed with requip,  Refilling at 0.5 mg dose

## 2019-05-05 NOTE — Assessment & Plan Note (Signed)
MRI of lumbar spine done in 2018 did not show spinal stenosis,   But her pain was not controlled with Celebrex and it raised her blood pressure.  Continue  use of vicodin once daily at bedtime only. Suspended  celebrex and continue tramadol for  daytime use.   Refill history confirmed via [place] Controlled Substance databas, accessed by me today.   Refills  December,  January and February

## 2019-05-05 NOTE — Progress Notes (Signed)
Telephone Note  This visit type was conducted due to national recommendations for restrictions regarding the COVID-19 pandemic (e.g. social distancing).  This format is felt to be most appropriate for this patient at this time.  All issues noted in this document were discussed and addressed.  No physical exam was performed (except for noted visual exam findings with Video Visits).   I attempted to connect  with@ on 05/05/19 at  2:30 PM EST by a video enabled telemedicine application . Interactive audio and video telecommunications were initially established beteen this provider and patient, however ultimately failed, due to patient having technical difficulties. We continued and completed visit with audio only  and verified that I am speaking with the correct person using two identifiers Location patient: home Location provider: work or home office Persons participating in the virtual visit: patient, provider  I discussed the limitations, risks, security and privacy concerns of performing an evaluation and management service by telephone and the availability of in person appointments. I also discussed with the patient that there may be a patient responsible charge related to this service. The patient expressed understanding and agreed to proceed. Reason for visit:med refill   HPI:  Right shoulder pain , seeing Katrinka BlazingSmith for treatment Since she has  [facility]  Manipulation has helped somewhat.  Still typing,  Baking etc  RLS:  requip helping aher sleep at night current dose 0.5 mg   Chronic back pain: .Refill history confirmed via [place] Controlled Substance databas, accessed by me today..  The patient has no signs or symptoms of COVID 19 infection (fever, cough, sore throat  or shortness of breath beyond what is typical for patient).  Patient denies contact with other persons with the above mentioned symptoms or with anyone confirmed to have COVID 19     ROS: See pertinent positives and negatives per HPI.  Past Medical History:  Diagnosis Date  . 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 Arrhythmia    left bundle branch block  . Arthritis   . Blood in stool   . Chicken pox   . Generalized headaches   . History of blood transfusion   . Kidney disease, chronic, stage III (GFR 30-59 ml/min)   . LBBB (left bundle branch block)   . Renal disorder   . Thyroid disease   . UTI (lower urinary tract infection)     Past Surgical History:  Procedure Laterality Date  . ABDOMINAL HYSTERECTOMY  1997  . APPENDECTOMY  1981  . BREAST EXCISIONAL BIOPSY Bilateral 1976   neg  . BREAST SURGERY  1976  . CARDIAC CATHETERIZATION  2004   [facility]  . CARDIAC CATHETERIZATION  2006   [facility]  . cystic fibrosis tumor removal  1983  . THUMB AMPUTATION  1992   traumatic  . TONSILLECTOMY AND ADENOIDECTOMY  1964    Family History  Problem Relation Age of Onset  . Heart disease Mother   . Arthritis Mother   . Cancer Mother        breast  . Hyperlipidemia Mother   . Hypertension Mother   . Heart attack Mother   . Breast cancer Mother [id number]  . Heart disease Father   . Diabetes Father   . Arthritis Father   . Hypertension Father   . Parkinson's disease Father   . Hodgkin's lymphoma Father  hodgkins disease, prostate  . Heart disease Sister   . Diabetes Sister   . Heart disease Brother 44       ami x 8,  4 vessel CABG   . Diabetes Brother   . Liver disease Brother   . Lung disease Brother   . Heart disease Maternal Grandmother   . Diabetes Maternal Grandmother   . Heart disease Maternal Grandfather   . Heart disease Paternal Grandmother   . Diabetes Paternal Grandmother   . Stroke Paternal Grandfather   . Heart disease Paternal Grandfather   . Diabetes Paternal Grandfather   . Diabetes Maternal Aunt      SOCIAL HX:  reports that she quit smoking about 6 years ago. Her smoking use included cigarettes. She has a 8.50 pack-year smoking history. She has never used smokeless tobacco. She reports current alcohol use. She reports that she does not use drugs.   Current Outpatient Medications:  .  albuterol (PROVENTIL HFA;VENTOLIN HFA) 108 (90 Base) MCG/ACT inhaler, Inhale 2 puffs into the lungs every 6 (six) hours as needed for wheezing or shortness of breath., Disp: 1 Inhaler, Rfl: 0 .  aspirin EC 81 MG tablet, Take 1 tablet (81 mg total) by mouth daily., Disp: , Rfl:  .  buPROPion (WELLBUTRIN SR) 100 MG 12 hr tablet, TAKE 1 TABLET BY MOUTH TWO  TIMES DAILY (Patient taking differently: Take 100 mg by mouth 2 (two) times daily. ), Disp: 180 tablet, Rfl: 3 .  Calcium Carbonate-Vit D-Min (CALCIUM 1200 PO), Take 1 tablet by mouth daily., Disp: , Rfl:  .  docusate sodium (COLACE) 100 MG capsule, Take 100 mg by mouth daily as needed for mild constipation., Disp: , Rfl:  .  gabapentin (NEURONTIN) 300 MG capsule, Take 1 capsule (300 mg total) by mouth 3 (three) times daily., Disp: 90 capsule, Rfl: 0 .  HYDROcodone-acetaminophen (NORCO/VICODIN) 5-325 MG tablet, Take 1 tablet by mouth daily as needed., Disp: 30 tablet, Rfl: 0 .  HYDROcodone-acetaminophen (NORCO/VICODIN) 5-325 MG tablet, Take 1 tablet by mouth daily as needed., Disp: 30 tablet, Rfl: 0 .  HYDROcodone-acetaminophen (NORCO/VICODIN) 5-325 MG tablet, Take 1 tablet by mouth daily as needed., Disp: 30 tablet, Rfl: 0 .  losartan (COZAAR) 50 MG tablet, Take 50 mg by mouth daily., Disp: , Rfl:  .  metoprolol succinate (TOPROL-XL) 25 MG 24 hr tablet, Take 1 tablet (25 mg total) by mouth daily., Disp: 90 tablet, Rfl: 3 .  nitroGLYCERIN (NITROSTAT) 0.4 MG SL tablet, Place 1 tablet (0.4 mg total) under the tongue every 5 (five) minutes as needed for chest pain., Disp: 25 tablet, Rfl: 3 .  progesterone (PROMETRIUM) 200 MG capsule, Take 200 mg by mouth at  bedtime. , Disp: , Rfl:  .  propranolol (INDERAL) 10 MG tablet, Take 1 tablet (10 mg total) by mouth 3 (three) times daily as needed., Disp: 90 tablet, Rfl: 1 .  rOPINIRole (REQUIP) 0.5 MG tablet, 1  tablets at bedtime for restless legs, Disp: 90 tablet, Rfl: 2 .  rosuvastatin (CRESTOR) 10 MG tablet, Take 1 tablet (10 mg total) by mouth daily., Disp: 90 tablet, Rfl: 3 .  traMADol (ULTRAM) 50 MG tablet, Take 1 tablet (50 mg total) by mouth 2 (two) times daily as needed. for pain, Disp: 60 tablet, Rfl: 3 .  venlafaxine XR (EFFEXOR-XR) 75 MG 24 hr capsule, TAKE 1 CAPSULE(75 MG) BY MOUTH DAILY WITH BREAKFAST (Patient taking differently: Take 75 mg by mouth daily with breakfast. ), Disp: 90 capsule, Rfl:  3 .  vitamin B-12 (CYANOCOBALAMIN) 1000 MCG tablet, Take 1,000 mcg by mouth daily., Disp: , Rfl:  .  vitamin C (ASCORBIC ACID) 500 MG tablet, Take 500 mg by mouth daily., Disp: , Rfl:  .  hydrOXYzine (ATARAX/VISTARIL) 25 MG tablet, TAKE 1 TABLET (25 MG TOTAL) BY MOUTH 3 (THREE) TIMES DAILY AS NEEDED FOR ITCHING. (Patient not taking: No sig reported), Disp: 90 tablet, Rfl: 3 .  tiZANidine (ZANAFLEX) 2 MG tablet, Take 1 tablet (2 mg total) by mouth every 6 (six) hours as needed for muscle spasms. (Patient not taking: Reported on 05/05/2019), Disp: 30 tablet, Rfl: 0  EXAM:   General impression: alert, cooperative and articulate.  No signs of being in distress  Lungs: speech is fluent sentence length suggests that patient is not short of breath and not punctuated by cough, sneezing or sniffing. [name]   Psych: affect normal.  speech is articulate and non pressured .  Denies suicidal thoughts   ASSESSMENT AND PLAN:  Discussed the following assessment and plan:  Cervical radiculopathy  Chronic low back pain without sciatica, unspecified back pain laterality  Restless legs  Cervical radiculopathy She has persistent pain managed with gabapentin and vicodin . Refill history confirmed via [place] Controlled  Substance databas, accessed by me today..  Chronic lower back pain MRI of lumbar spine done in 2018 did not show spinal stenosis,   But her pain was not controlled with Celebrex and it raised her blood pressure.  Continue  use of vicodin once daily at bedtime only. Suspended  celebrex and continue tramadol for  daytime use.   Refill history confirmed via [place] Controlled Substance databas, accessed by me today.   Refills  December,  January and February   Restless legs Symptoms occurring at night only.  Managed with requip,  Refilling at 0.5 mg dose     I discussed the assessment and treatment plan with the patient. The patient was provided an opportunity to ask questions and all were answered. The patient agreed with the plan and demonstrated an understanding of the instructions.   The patient was advised to call back or seek an in-person evaluation if the symptoms worsen or if the condition fails to improve as anticipated.  I provided  15 minutes of non-face-to-face time during this encounter reviewing patient's current problems and past procedures/imaging studies, providing counseling on the above mentioned problems , and coordination  of care .  Crecencio Mc, MD

## 2019-05-05 NOTE — Assessment & Plan Note (Signed)
She has persistent pain managed with gabapentin and vicodin . Refill history confirmed via [place] Controlled Substance databas, accessed by me today.[name]

## 2019-05-08 DIAGNOSIS — J029 Acute pharyngitis, unspecified: Secondary | ICD-10-CM | POA: Diagnosis not present

## 2019-05-08 DIAGNOSIS — R05 Cough: Secondary | ICD-10-CM | POA: Diagnosis not present

## 2019-05-09 LAB — CBC WITH DIFFERENTIAL/PLATELET
Basophils Absolute: 0.1 10*3/uL (ref 0.0–0.2)
Basos: 1 %
EOS (ABSOLUTE): 0.2 10*3/uL (ref 0.0–0.4)
Eos: 2 %
Hematocrit: 33.4 % — ABNORMAL LOW (ref 34.0–46.6)
Hemoglobin: 10.1 g/dL — ABNORMAL LOW (ref 11.1–15.9)
Immature Grans (Abs): 0.1 10*3/uL (ref 0.0–0.1)
Immature Granulocytes: 1 %
Lymphocytes Absolute: 3.9 10*3/uL — ABNORMAL HIGH (ref 0.7–3.1)
Lymphs: 35 %
MCH: 21.6 pg — ABNORMAL LOW (ref 26.6–33.0)
MCHC: 30.2 g/dL — ABNORMAL LOW (ref 31.5–35.7)
MCV: 71 fL — ABNORMAL LOW (ref 79–97)
Monocytes Absolute: 0.9 10*3/uL (ref 0.1–0.9)
Monocytes: 8 %
NRBC: 1 % — ABNORMAL HIGH (ref 0–0)
Neutrophils Absolute: 6.1 10*3/uL (ref 1.4–7.0)
Neutrophils: 53 %
Platelets: 346 10*3/uL (ref 150–450)
RBC: 4.68 x10E6/uL (ref 3.77–5.28)
RDW: 17.4 % — ABNORMAL HIGH (ref 11.7–15.4)
WBC: 11.3 10*3/uL — ABNORMAL HIGH (ref 3.4–10.8)

## 2019-05-14 DIAGNOSIS — N951 Menopausal and female climacteric states: Secondary | ICD-10-CM | POA: Diagnosis not present

## 2019-05-14 DIAGNOSIS — R5383 Other fatigue: Secondary | ICD-10-CM | POA: Diagnosis not present

## 2019-05-14 DIAGNOSIS — R232 Flushing: Secondary | ICD-10-CM | POA: Diagnosis not present

## 2019-05-15 ENCOUNTER — Telehealth: Payer: Self-pay | Admitting: Internal Medicine

## 2019-05-15 DIAGNOSIS — M19011 Primary osteoarthritis, right shoulder: Secondary | ICD-10-CM

## 2019-05-15 MED ORDER — HYDROCODONE-ACETAMINOPHEN 5-325 MG PO TABS: 1.0000 | ORAL_TABLET | Freq: Every day | ORAL | 0 refills | Status: DC | PRN

## 2019-05-15 NOTE — Telephone Encounter (Signed)
Looks like her hydrocodone rxs went to the mail order as well.

## 2019-05-15 NOTE — Telephone Encounter (Signed)
Correct,,   3 month of refills have been resent to  to [facility]

## 2019-05-15 NOTE — Telephone Encounter (Signed)
Pt's husband said they called in refill request for hydrocodone two weeks ago and it hasn't been sent to [facility] in [place].

## 2019-05-18 NOTE — Telephone Encounter (Signed)
Spoke with pt to let her know that the medication has been resent to [facility]. Pt gave a verbal understanding.

## 2019-05-18 NOTE — Telephone Encounter (Signed)
LMTCB

## 2019-05-20 DIAGNOSIS — N951 Menopausal and female climacteric states: Secondary | ICD-10-CM | POA: Diagnosis not present

## 2019-05-20 DIAGNOSIS — M2550 Pain in unspecified joint: Secondary | ICD-10-CM | POA: Diagnosis not present

## 2019-05-20 DIAGNOSIS — R232 Flushing: Secondary | ICD-10-CM | POA: Diagnosis not present

## 2019-06-12 ENCOUNTER — Other Ambulatory Visit: Payer: Self-pay | Admitting: Family Medicine

## 2019-06-19 ENCOUNTER — Telehealth: Payer: Self-pay | Admitting: Internal Medicine

## 2019-06-19 ENCOUNTER — Other Ambulatory Visit: Payer: Self-pay | Admitting: Lab

## 2019-06-19 MED ORDER — TRAMADOL HCL 50 MG PO TABS: 50.0000 mg | ORAL_TABLET | Freq: Two times a day (BID) | ORAL | 3 refills | Status: DC | PRN

## 2019-06-19 NOTE — Telephone Encounter (Signed)
Pt called about needing a refill for traMADol (ULTRAM) 50 MG tablet Please advise and Thank you!  Pharmacy is [id number] - [place], [place] - [address] [place] [address] [place]  Call pt @ [phone].

## 2019-06-19 NOTE — Telephone Encounter (Signed)
Rx sent to Dr Darrick Huntsman for refill

## 2019-06-22 NOTE — Telephone Encounter (Signed)
Medication has been refilled.

## 2019-06-23 ENCOUNTER — Ambulatory Visit: Payer: BC Managed Care – PPO | Admitting: Family Medicine

## 2019-07-03 ENCOUNTER — Ambulatory Visit: Payer: BC Managed Care – PPO | Admitting: Family Medicine

## 2019-07-31 ENCOUNTER — Ambulatory Visit (INDEPENDENT_AMBULATORY_CARE_PROVIDER_SITE_OTHER): Payer: BC Managed Care – PPO | Admitting: Family Medicine

## 2019-07-31 ENCOUNTER — Encounter: Payer: Self-pay | Admitting: Family Medicine

## 2019-07-31 DIAGNOSIS — G8929 Other chronic pain: Secondary | ICD-10-CM

## 2019-07-31 DIAGNOSIS — M5412 Radiculopathy, cervical region: Secondary | ICD-10-CM | POA: Diagnosis not present

## 2019-07-31 DIAGNOSIS — M542 Cervicalgia: Secondary | ICD-10-CM | POA: Diagnosis not present

## 2019-07-31 MED ORDER — PREDNISONE 50 MG PO TABS: 50.0000 mg | ORAL_TABLET | Freq: Every day | ORAL | 0 refills | Status: DC

## 2019-07-31 NOTE — Progress Notes (Signed)
Virtual Visit via Video Note  I connected with [name] on 07/31/19 at  3:00 PM EST by a video enabled telemedicine application and verified that I am speaking with the correct person using two identifiers.  Location: Patient: Patient is at home with her husband Provider: In office visit   I discussed the limitations of evaluation and management by telemedicine and the availability of in person appointments. The patient expressed understanding and agreed to proceed.  History of Present Illness: Patient is a 64 year old female with known degenerative disc disease of the cervical spine with radiculopathy that has responded to epidurals previously complaining of bilateral shoulder pain.  Feels that it could be secondary to her positioning at work.  Rates the severity of pain is 8 out of 10.  Seems to do better when she sleeps at night and then worse throughout the day.  Patient is going to be making some transitions and how she sits at work to see if that makes an improvement.  Has tried ibuprofen with mild improvement.   Observations/Objective: Alert, difficulty with visual thought for today.  Oriented x3.   Assessment and Plan: 64 year old female with history of degenerative disc disease and cervical radiculopathy with potential for a shoulder bursitis.  My chart exercises, prednisone for 5 days given, discussed icing regimen and topical anti-inflammatories.  This is a chronic problem with exacerbation.  Follow Up Instructions:Follow-up again in 4 to 8 weeks.     I discussed the assessment and treatment plan with the patient. The patient was provided an opportunity to ask questions and all were answered. The patient agreed with the plan and demonstrated an understanding of the instructions.   The patient was advised to call back or seek an in-person evaluation if the symptoms worsen or if the condition fails to improve as anticipated.  I provided 15 minutes of face-to-face time  during this encounter.   Judi Saa, DO

## 2019-08-05 ENCOUNTER — Encounter: Payer: Self-pay | Admitting: Internal Medicine

## 2019-08-05 ENCOUNTER — Telehealth (INDEPENDENT_AMBULATORY_CARE_PROVIDER_SITE_OTHER): Payer: BC Managed Care – PPO | Admitting: Internal Medicine

## 2019-08-05 VITALS — BP 123/72 | Ht 63.0 in | Wt 160.0 lb

## 2019-08-05 DIAGNOSIS — M19011 Primary osteoarthritis, right shoulder: Secondary | ICD-10-CM | POA: Diagnosis not present

## 2019-08-05 DIAGNOSIS — M19012 Primary osteoarthritis, left shoulder: Secondary | ICD-10-CM

## 2019-08-05 DIAGNOSIS — M5412 Radiculopathy, cervical region: Secondary | ICD-10-CM | POA: Diagnosis not present

## 2019-08-05 DIAGNOSIS — G2581 Restless legs syndrome: Secondary | ICD-10-CM

## 2019-08-05 DIAGNOSIS — I129 Hypertensive chronic kidney disease with stage 1 through stage 4 chronic kidney disease, or unspecified chronic kidney disease: Secondary | ICD-10-CM

## 2019-08-05 DIAGNOSIS — Z7189 Other specified counseling: Secondary | ICD-10-CM

## 2019-08-05 MED ORDER — HYDROCODONE-ACETAMINOPHEN 5-325 MG PO TABS: 2.0000 | ORAL_TABLET | Freq: Every day | ORAL | 0 refills | Status: DC | PRN

## 2019-08-05 MED ORDER — METOPROLOL SUCCINATE ER 25 MG PO TB24: 25.0000 mg | ORAL_TABLET | Freq: Every day | ORAL | 3 refills | Status: DC

## 2019-08-05 NOTE — Progress Notes (Signed)
Virtual Visit via caregility  This visit type was conducted due to national recommendations for restrictions regarding the COVID-19 pandemic (e.g. social distancing).  This format is felt to be most appropriate for this patient at this time.  All issues noted in this document were discussed and addressed.  No physical exam was performed (except for noted visual exam findings with Video Visits).   I connected with@ on 08/05/19 at  2:30 PM EST by a video enabled telemedicine application  and verified that I am speaking with the correct person using two identifiers. Location patient: home Location provider: work or home office Persons participating in the virtual visit: patient, provider  I discussed the limitations, risks, security and privacy concerns of performing an evaluation and management service by telephone and the availability of in person appointments. I also discussed with the patient that there may be a patient responsible charge related to this service. The patient expressed understanding and agreed to proceed.   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  WAS REFERRED FOR ESI IN [place],R  DID NOT HELP.  TAKING GABAPENTIN 300 MG TID,  AND AWAITING PREDNISONE RX THAT WAS PRESCRIBED ON MARCH 5  50 MG QD X 5  BUT SENT TO MAIL ORDER Pain is still severe,  Not sleeping well . Using once vicodin daily   Hypertension: patient checks blood pressure twice weekly at home.  Readings have been for the most part > 140/80 at rest . Patient is following a reduce salt diet most days and is taking medications as prescribed  rls :  USING REQUIP AND GABAPENTIN .  STILL NOT SLEEPING WELL BUT ATTRIBUTES IT TO SHOULDER PAIN    ROS: See pertinent positives and negatives per HPI.  Past Medical History:  Diagnosis Date  . 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 Arrhythmia    left bundle branch block  . Arthritis   . Blood in stool   . Chicken pox   . Generalized headaches   . History of blood transfusion   . Kidney disease, chronic, stage III (GFR 30-59 ml/min)   . LBBB (left bundle branch block)   . Renal disorder   . Thyroid disease   . UTI (lower urinary tract infection)     Past Surgical History:  Procedure Laterality Date  . ABDOMINAL HYSTERECTOMY  1997  . APPENDECTOMY  1981  . BREAST EXCISIONAL BIOPSY Bilateral 1976   neg  . BREAST SURGERY  1976  . CARDIAC CATHETERIZATION  2004   [facility]  . CARDIAC CATHETERIZATION  2006   [facility]  . cystic fibrosis tumor removal  1983  . THUMB AMPUTATION  1992   traumatic  . TONSILLECTOMY AND ADENOIDECTOMY  1964    Family History  Problem Relation Age of Onset  . Heart disease Mother   . Arthritis Mother   . Cancer Mother        breast  . Hyperlipidemia Mother   . Hypertension Mother   . Heart attack Mother   . Breast cancer Mother 80  . Heart disease Father   . Diabetes Father   . Arthritis Father   . Hypertension Father   . Parkinson's disease Father   . Hodgkin's lymphoma Father  hodgkins disease, prostate  . Heart disease Sister   . Diabetes Sister   . Heart disease Brother 65       ami x 8,  4 vessel CABG   . Diabetes Brother   . Liver disease Brother   . Lung disease Brother   . Heart disease Maternal Grandmother   . Diabetes Maternal Grandmother   . Heart disease Maternal Grandfather   . Heart disease Paternal Grandmother   . Diabetes Paternal Grandmother   . Stroke Paternal Grandfather   . Heart disease Paternal Grandfather   . Diabetes Paternal Grandfather   . Diabetes Maternal Aunt     SOCIAL HX:  reports that she quit smoking about 6 years ago. Her smoking use included cigarettes. She has a 8.50 pack-year smoking  history. She has never used smokeless tobacco. She reports current alcohol use. She reports that she does not use drugs.   Current Outpatient Medications:  .  albuterol (PROVENTIL HFA;VENTOLIN HFA) 108 (90 Base) MCG/ACT inhaler, Inhale 2 puffs into the lungs every 6 (six) hours as needed for wheezing or shortness of breath., Disp: 1 Inhaler, Rfl: 0 .  aspirin EC 81 MG tablet, Take 1 tablet (81 mg total) by mouth daily., Disp: , Rfl:  .  buPROPion (WELLBUTRIN SR) 100 MG 12 hr tablet, TAKE 1 TABLET BY MOUTH TWO  TIMES DAILY (Patient taking differently: Take 100 mg by mouth 2 (two) times daily. ), Disp: 180 tablet, Rfl: 3 .  Calcium Carbonate-Vit D-Min (CALCIUM 1200 PO), Take 1 tablet by mouth daily., Disp: , Rfl:  .  docusate sodium (COLACE) 100 MG capsule, Take 100 mg by mouth daily as needed for mild constipation., Disp: , Rfl:  .  gabapentin (NEURONTIN) 300 MG capsule, TAKE 1 CAPSULE BY MOUTH 3  TIMES DAILY, Disp: 90 capsule, Rfl: 11 .  HYDROcodone-acetaminophen (NORCO/VICODIN) 5-325 MG tablet, Take 1 tablet by mouth daily as needed., Disp: 30 tablet, Rfl: 0 .  HYDROcodone-acetaminophen (NORCO/VICODIN) 5-325 MG tablet, Take 1 tablet by mouth daily as needed., Disp: 30 tablet, Rfl: 0 .  HYDROcodone-acetaminophen (NORCO/VICODIN) 5-325 MG tablet, Take 2 tablets by mouth daily as needed., Disp: 60 tablet, Rfl: 0 .  losartan (COZAAR) 50 MG tablet, Take 50 mg by mouth daily., Disp: , Rfl:  .  metoprolol succinate (TOPROL-XL) 25 MG 24 hr tablet, Take 1 tablet (25 mg total) by mouth daily., Disp: 90 tablet, Rfl: 3 .  nitroGLYCERIN (NITROSTAT) 0.4 MG SL tablet, Place 1 tablet (0.4 mg total) under the tongue every 5 (five) minutes as needed for chest pain., Disp: 25 tablet, Rfl: 3 .  predniSONE (DELTASONE) 50 MG tablet, Take 1 tablet (50 mg total) by mouth daily., Disp: 5 tablet, Rfl: 0 .  progesterone (PROMETRIUM) 200 MG capsule, Take 200 mg by mouth at bedtime. , Disp: , Rfl:  .  propranolol (INDERAL) 10  MG tablet, Take 1 tablet (10 mg total) by mouth 3 (three) times daily as needed., Disp: 90 tablet, Rfl: 1 .  rOPINIRole (REQUIP) 0.5 MG tablet, 1  tablets at bedtime for restless legs, Disp: 90 tablet, Rfl: 2 .  rosuvastatin (CRESTOR) 10 MG tablet, Take 1 tablet (10 mg total) by mouth daily., Disp: 90 tablet, Rfl: 3 .  tiZANidine (ZANAFLEX) 2 MG tablet, Take 1 tablet (2 mg total) by mouth every 6 (six) hours as needed for muscle spasms., Disp: 30 tablet, Rfl: 0 .  traMADol (ULTRAM) 50 MG tablet, Take 1 tablet (50 mg total) by mouth 2 (  two) times daily as needed. for pain, Disp: 60 tablet, Rfl: 3 .  venlafaxine XR (EFFEXOR-XR) 75 MG 24 hr capsule, TAKE 1 CAPSULE(75 MG) BY MOUTH DAILY WITH BREAKFAST (Patient taking differently: Take 75 mg by mouth daily with breakfast. ), Disp: 90 capsule, Rfl: 3 .  vitamin B-12 (CYANOCOBALAMIN) 1000 MCG tablet, Take 1,000 mcg by mouth daily., Disp: , Rfl:  .  vitamin C (ASCORBIC ACID) 500 MG tablet, Take 500 mg by mouth daily., Disp: , Rfl:  .  hydrOXYzine (ATARAX/VISTARIL) 25 MG tablet, TAKE 1 TABLET (25 MG TOTAL) BY MOUTH 3 (THREE) TIMES DAILY AS NEEDED FOR ITCHING. (Patient not taking: No sig reported), Disp: 90 tablet, Rfl: 3  EXAM:  VITALS per patient if applicable:  GENERAL: alert, oriented, appears well and in no acute distress  HEENT: atraumatic, conjunttiva clear, no obvious abnormalities on inspection of external nose and ears  NECK: normal movements of the head and neck  LUNGS: on inspection no signs of respiratory distress, breathing rate appears normal, no obvious gross SOB, gasping or wheezing  CV: no obvious cyanosis  MS: moves all visible extremities without noticeable abnormality  PSYCH/NEURO: pleasant and cooperative, no obvious depression or anxiety, speech and thought processing grossly intact  ASSESSMENT AND PLAN:  Discussed the following assessment and plan:  Renal hypertension - Plan: Comprehensive metabolic  panel  Osteoarthritis of both shoulders - Plan: HYDROcodone-acetaminophen (NORCO/VICODIN) 5-325 MG tablet  Cervical radiculopathy  Restless legs  Educated about COVID-19 virus infection  Cervical radiculopathy Reviewed workup thus far.  Referred to [name] , who referred her to [facility] for ESI.  Had No response to first ESI.  Advised to follow up with Chasnis for one more ESI.  Increase vicodin to bid dosing given current pain level interfering with sleep .  Prednisone taper was sent correctly by Zach to local pharmacy , patient advise to call   Renal hypertension Improved with initiation of losartan . Cr and lytes unchanged and need repeating   Lab Results  Component Value Date   CREATININE 1.01 (H) 01/30/2019   Lab Results  Component Value Date   NA 139 01/30/2019   K 4.1 01/30/2019   CL 108 01/30/2019   CO2 22 01/30/2019     Restless legs Symptoms occurring at night only.  Managed with requip,   at 0.5 mg dose   Educated About Covid-19 Virus Infection She has no interest in getting the vaccine     I discussed the assessment and treatment plan with the patient. The patient was provided an opportunity to ask questions and all were answered. The patient agreed with the plan and demonstrated an understanding of the instructions.   The patient was advised to call back or seek an in-person evaluation if the symptoms worsen or if the condition fails to improve as anticipated.  I provided  30 minutes of non-face-to-face time during this encounter reviewing patient's current problems and past surgeries, labs and imaging studies, providing counseling on the above mentioned problems , and coordination  of care .  Sherlene Shams, MD

## 2019-08-07 DIAGNOSIS — R232 Flushing: Secondary | ICD-10-CM | POA: Diagnosis not present

## 2019-08-07 DIAGNOSIS — N951 Menopausal and female climacteric states: Secondary | ICD-10-CM | POA: Diagnosis not present

## 2019-08-07 DIAGNOSIS — M2550 Pain in unspecified joint: Secondary | ICD-10-CM | POA: Diagnosis not present

## 2019-08-07 NOTE — Assessment & Plan Note (Signed)
She has no interest in getting the vaccine

## 2019-08-07 NOTE — Assessment & Plan Note (Signed)
Improved with initiation of losartan . Cr and lytes unchanged and need repeating   Lab Results  Component Value Date   CREATININE 1.01 (H) 01/30/2019   Lab Results  Component Value Date   NA 139 01/30/2019   K 4.1 01/30/2019   CL 108 01/30/2019   CO2 22 01/30/2019

## 2019-08-07 NOTE — Assessment & Plan Note (Signed)
Symptoms occurring at night only.  Managed with requip,   at 0.5 mg dose

## 2019-08-07 NOTE — Assessment & Plan Note (Addendum)
Reviewed workup thus far.  Referred to [name] , who referred her to [facility] for ESI.  Had No response to first ESI.  Advised to follow up with Chasnis for one more ESI.  Increase vicodin to bid dosing given current pain level interfering with sleep .  Prednisone taper was sent correctly by Ian Malkin to local pharmacy , patient advise to call

## 2019-08-21 DIAGNOSIS — R232 Flushing: Secondary | ICD-10-CM | POA: Diagnosis not present

## 2019-08-21 DIAGNOSIS — N951 Menopausal and female climacteric states: Secondary | ICD-10-CM | POA: Diagnosis not present

## 2019-08-21 DIAGNOSIS — R5383 Other fatigue: Secondary | ICD-10-CM | POA: Diagnosis not present

## 2019-08-31 ENCOUNTER — Telehealth: Payer: Self-pay | Admitting: Internal Medicine

## 2019-08-31 NOTE — Telephone Encounter (Signed)
Lab orders faxed to [facility] as requested and changed in chart patient notified.

## 2019-08-31 NOTE — Telephone Encounter (Signed)
Pt Needs lab orders sent to [facility]

## 2019-08-31 NOTE — Addendum Note (Signed)
Addended by: Dennie Bible on: 08/31/2019 09:30 AM   Modules accepted: Orders

## 2019-09-08 ENCOUNTER — Other Ambulatory Visit: Payer: Self-pay | Admitting: Internal Medicine

## 2019-09-08 DIAGNOSIS — M19012 Primary osteoarthritis, left shoulder: Secondary | ICD-10-CM

## 2019-09-08 DIAGNOSIS — M19011 Primary osteoarthritis, right shoulder: Secondary | ICD-10-CM

## 2019-09-08 MED ORDER — HYDROCODONE-ACETAMINOPHEN 5-325 MG PO TABS: 2.0000 | ORAL_TABLET | Freq: Every day | ORAL | 0 refills | Status: DC | PRN

## 2019-09-08 MED ORDER — HYDROCODONE-ACETAMINOPHEN 5-325 MG PO TABS: 1.0000 | ORAL_TABLET | Freq: Every day | ORAL | 0 refills | Status: DC | PRN

## 2019-09-08 NOTE — Telephone Encounter (Signed)
Pt needs a refill on HYDROcodone-acetaminophen (NORCO/VICODIN) 5-325 MG tablet sent to [facility] Pt is out of medication and would like it sent in for a 3 month supply

## 2019-09-08 NOTE — Telephone Encounter (Signed)
I will refill one more time at #60/month.  After that I will reduce to #30 per month bc she is supposed to be Seeing Chesnis for another [facility]

## 2019-09-08 NOTE — Addendum Note (Signed)
Addended by: Sherlene Shams on: 09/08/2019 09:46 AM   Modules accepted: Orders

## 2019-09-08 NOTE — Telephone Encounter (Signed)
Refill request for norco, last seen 08-05-19, last filled [phone].  Please advise.

## 2019-10-16 DIAGNOSIS — M9901 Segmental and somatic dysfunction of cervical region: Secondary | ICD-10-CM | POA: Diagnosis not present

## 2019-10-16 DIAGNOSIS — M9902 Segmental and somatic dysfunction of thoracic region: Secondary | ICD-10-CM | POA: Diagnosis not present

## 2019-10-16 DIAGNOSIS — M9903 Segmental and somatic dysfunction of lumbar region: Secondary | ICD-10-CM | POA: Diagnosis not present

## 2019-10-19 DIAGNOSIS — M9901 Segmental and somatic dysfunction of cervical region: Secondary | ICD-10-CM | POA: Diagnosis not present

## 2019-10-19 DIAGNOSIS — M9903 Segmental and somatic dysfunction of lumbar region: Secondary | ICD-10-CM | POA: Diagnosis not present

## 2019-10-19 DIAGNOSIS — M9902 Segmental and somatic dysfunction of thoracic region: Secondary | ICD-10-CM | POA: Diagnosis not present

## 2019-10-20 DIAGNOSIS — M9902 Segmental and somatic dysfunction of thoracic region: Secondary | ICD-10-CM | POA: Diagnosis not present

## 2019-10-20 DIAGNOSIS — M9903 Segmental and somatic dysfunction of lumbar region: Secondary | ICD-10-CM | POA: Diagnosis not present

## 2019-10-20 DIAGNOSIS — M9901 Segmental and somatic dysfunction of cervical region: Secondary | ICD-10-CM | POA: Diagnosis not present

## 2019-10-21 DIAGNOSIS — M9901 Segmental and somatic dysfunction of cervical region: Secondary | ICD-10-CM | POA: Diagnosis not present

## 2019-10-21 DIAGNOSIS — M9902 Segmental and somatic dysfunction of thoracic region: Secondary | ICD-10-CM | POA: Diagnosis not present

## 2019-10-21 DIAGNOSIS — M9903 Segmental and somatic dysfunction of lumbar region: Secondary | ICD-10-CM | POA: Diagnosis not present

## 2019-10-22 ENCOUNTER — Telehealth: Payer: Self-pay

## 2019-10-22 NOTE — Telephone Encounter (Signed)
Pt is going to have labs drawn tomorrow at [facility] in [place] but the only thing that I see ordered is a CMP. Is there anything else that needs to be ordered?

## 2019-10-22 NOTE — Telephone Encounter (Signed)
CMET is fine   She needs full panel for  annual labs in September

## 2019-10-23 DIAGNOSIS — M9903 Segmental and somatic dysfunction of lumbar region: Secondary | ICD-10-CM | POA: Diagnosis not present

## 2019-10-23 DIAGNOSIS — M9901 Segmental and somatic dysfunction of cervical region: Secondary | ICD-10-CM | POA: Diagnosis not present

## 2019-10-23 DIAGNOSIS — I129 Hypertensive chronic kidney disease with stage 1 through stage 4 chronic kidney disease, or unspecified chronic kidney disease: Secondary | ICD-10-CM | POA: Diagnosis not present

## 2019-10-23 DIAGNOSIS — M9902 Segmental and somatic dysfunction of thoracic region: Secondary | ICD-10-CM | POA: Diagnosis not present

## 2019-10-23 NOTE — Telephone Encounter (Signed)
Noted. Pt had labs done this morning.

## 2019-10-24 LAB — COMPREHENSIVE METABOLIC PANEL
ALT: 9 IU/L (ref 0–32)
AST: 12 IU/L (ref 0–40)
Albumin/Globulin Ratio: 2.3 — ABNORMAL HIGH (ref 1.2–2.2)
Albumin: 4.4 g/dL (ref 3.8–4.8)
Alkaline Phosphatase: 76 IU/L (ref 48–121)
BUN/Creatinine Ratio: 12 (ref 12–28)
BUN: 12 mg/dL (ref 8–27)
Bilirubin Total: 0.6 mg/dL (ref 0.0–1.2)
CO2: 20 mmol/L (ref 20–29)
Calcium: 9.2 mg/dL (ref 8.7–10.3)
Chloride: 106 mmol/L (ref 96–106)
Creatinine, Ser: 1.04 mg/dL — ABNORMAL HIGH (ref 0.57–1.00)
GFR calc Af Amer: 66 mL/min/{1.73_m2} (ref >59)
GFR calc non Af Amer: 57 mL/min/{1.73_m2} — ABNORMAL LOW (ref >59)
Globulin, Total: 1.9 g/dL (ref 1.5–4.5)
Glucose: 94 mg/dL (ref 65–99)
Potassium: 4.6 mmol/L (ref 3.5–5.2)
Sodium: 139 mmol/L (ref 134–144)
Total Protein: 6.3 g/dL (ref 6.0–8.5)

## 2019-10-27 DIAGNOSIS — M9903 Segmental and somatic dysfunction of lumbar region: Secondary | ICD-10-CM | POA: Diagnosis not present

## 2019-10-27 DIAGNOSIS — M9901 Segmental and somatic dysfunction of cervical region: Secondary | ICD-10-CM | POA: Diagnosis not present

## 2019-10-27 DIAGNOSIS — M9902 Segmental and somatic dysfunction of thoracic region: Secondary | ICD-10-CM | POA: Diagnosis not present

## 2019-10-28 DIAGNOSIS — M9902 Segmental and somatic dysfunction of thoracic region: Secondary | ICD-10-CM | POA: Diagnosis not present

## 2019-10-28 DIAGNOSIS — M9901 Segmental and somatic dysfunction of cervical region: Secondary | ICD-10-CM | POA: Diagnosis not present

## 2019-10-28 DIAGNOSIS — M9903 Segmental and somatic dysfunction of lumbar region: Secondary | ICD-10-CM | POA: Diagnosis not present

## 2019-10-30 DIAGNOSIS — M9902 Segmental and somatic dysfunction of thoracic region: Secondary | ICD-10-CM | POA: Diagnosis not present

## 2019-10-30 DIAGNOSIS — M9903 Segmental and somatic dysfunction of lumbar region: Secondary | ICD-10-CM | POA: Diagnosis not present

## 2019-10-30 DIAGNOSIS — M9901 Segmental and somatic dysfunction of cervical region: Secondary | ICD-10-CM | POA: Diagnosis not present

## 2019-11-02 DIAGNOSIS — M9903 Segmental and somatic dysfunction of lumbar region: Secondary | ICD-10-CM | POA: Diagnosis not present

## 2019-11-02 DIAGNOSIS — M9901 Segmental and somatic dysfunction of cervical region: Secondary | ICD-10-CM | POA: Diagnosis not present

## 2019-11-02 DIAGNOSIS — M9902 Segmental and somatic dysfunction of thoracic region: Secondary | ICD-10-CM | POA: Diagnosis not present

## 2019-11-04 ENCOUNTER — Ambulatory Visit: Payer: BC Managed Care – PPO | Admitting: Internal Medicine

## 2019-11-04 ENCOUNTER — Encounter: Payer: Self-pay | Admitting: Internal Medicine

## 2019-11-04 ENCOUNTER — Other Ambulatory Visit: Payer: Self-pay

## 2019-11-04 VITALS — BP 128/64 | HR 68 | Temp 97.9°F | Resp 15 | Ht 63.0 in | Wt 163.6 lb

## 2019-11-04 DIAGNOSIS — M542 Cervicalgia: Secondary | ICD-10-CM | POA: Diagnosis not present

## 2019-11-04 DIAGNOSIS — M19011 Primary osteoarthritis, right shoulder: Secondary | ICD-10-CM | POA: Diagnosis not present

## 2019-11-04 DIAGNOSIS — G4733 Obstructive sleep apnea (adult) (pediatric): Secondary | ICD-10-CM | POA: Diagnosis not present

## 2019-11-04 DIAGNOSIS — M19012 Primary osteoarthritis, left shoulder: Secondary | ICD-10-CM

## 2019-11-04 DIAGNOSIS — R918 Other nonspecific abnormal finding of lung field: Secondary | ICD-10-CM

## 2019-11-04 DIAGNOSIS — G8929 Other chronic pain: Secondary | ICD-10-CM

## 2019-11-04 DIAGNOSIS — E782 Mixed hyperlipidemia: Secondary | ICD-10-CM

## 2019-11-04 DIAGNOSIS — Z1231 Encounter for screening mammogram for malignant neoplasm of breast: Secondary | ICD-10-CM

## 2019-11-04 MED ORDER — HYDROCODONE-ACETAMINOPHEN 5-325 MG PO TABS: 2.0000 | ORAL_TABLET | Freq: Every day | ORAL | 0 refills | Status: DC | PRN

## 2019-11-04 MED ORDER — HYDROCODONE-ACETAMINOPHEN 5-325 MG PO TABS: 1.0000 | ORAL_TABLET | Freq: Two times a day (BID) | ORAL | 0 refills | Status: DC | PRN

## 2019-11-04 NOTE — Progress Notes (Signed)
Subjective:  Patient ID: Cheryl [name], female    DOB: 1955/06/06  Age: 64 y.o. MRN: [id number]  CC: The primary encounter diagnosis was Encounter for screening mammogram for malignant neoplasm of breast. Diagnoses of Osteoarthritis of both shoulders, OSA (obstructive sleep apnea), Chronic neck pain, Multiple lung nodules, and Mixed hyperlipidemia were also pertinent to this visit.  HPI Cheryl [name] presents for follow up on chronic back pain managed with hydrocodone and tramadol   This visit occurred during the SARS-CoV-2 public health emergency.  Safety protocols were in place, including screening questions prior to the visit, additional usage of staff PPE, and extensive cleaning of exam room while observing appropriate contact time as indicated for disinfecting solutions.    Patient has received NO  doses of the available COVID 19 vaccines due to fear of its effect on her chronic condition of  thalassemia. .Patient continues to mask when outside of the home except when walking in yard or at safe distances from others .  Patient denies any change in mood or development of unhealthy behaviors resuting from the pandemic's restriction of activities and socialization.   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  Relieving the occipital headaches,  Sitting with shoulders back  elbows at side and back arched. Using a cervical pillow .  Still using 2 vicodin daily,  Alternates with tramadol.   Doing some gardening pulling weeds but no other exercises that qualify as aerobic   Alternating between tramadol and vicodin every 6 hours for pain management. She has not had any ER visits  And has not requested any early refills.  Her Refill history was confirmed via [place] Controlled Substance database by me today during her visit and there have  been no prescriptions of controlled substances filled from any providers other than me. .   Outpatient Medications Prior to Visit  Medication Sig Dispense Refill  . albuterol (PROVENTIL HFA;VENTOLIN HFA) 108 (90 Base) MCG/ACT inhaler Inhale 2 puffs into the lungs every 6 (six) hours as needed for wheezing or shortness of breath. 1 Inhaler 0  . aspirin EC 81 MG tablet Take 1 tablet (81 mg total) by mouth daily.    [name] buPROPion (WELLBUTRIN SR) 100 MG 12 hr tablet TAKE 1 TABLET BY MOUTH TWO  TIMES DAILY (Patient taking differently: Take 100 mg by mouth 2 (two) times daily. ) 180 tablet 3  . Calcium Carbonate-Vit D-Min (CALCIUM 1200 PO) Take 1 tablet by mouth daily.    [name] docusate sodium (COLACE) 100 MG capsule Take 100 mg by mouth daily as needed for mild constipation.    . gabapentin (NEURONTIN) 300 MG capsule TAKE 1 CAPSULE BY MOUTH 3  TIMES DAILY 90 capsule 11  . hydrOXYzine (ATARAX/VISTARIL) 25 MG tablet TAKE 1 TABLET (25 MG TOTAL) BY MOUTH 3 (THREE) TIMES DAILY AS NEEDED FOR ITCHING. 90 tablet 3  . losartan (COZAAR) 50 MG tablet Take 50 mg by mouth daily.    . metoprolol succinate (TOPROL-XL) 25 MG 24 hr tablet Take 1 tablet (25 mg total) by mouth daily. 90 tablet 3  . nitroGLYCERIN (NITROSTAT) 0.4 MG SL tablet Place 1 tablet (0.4 mg total) under the tongue every 5 (five) minutes as needed for chest pain. 25 tablet 3  . predniSONE (DELTASONE) 50 MG tablet Take 1 tablet (50 mg total) by  mouth daily. 5 tablet 0  . progesterone (PROMETRIUM) 200 MG capsule Take 200 mg by mouth at bedtime.     . propranolol (INDERAL) 10 MG tablet Take 1 tablet (10 mg total) by mouth 3 (three) times daily as needed. 90 tablet 1  . rOPINIRole (REQUIP) 0.5 MG tablet 1  tablets at bedtime for restless legs 90 tablet 2  . rosuvastatin (CRESTOR) 10 MG tablet Take 1 tablet (10 mg total) by mouth daily. 90 tablet 3  . tiZANidine (ZANAFLEX) 2 MG tablet Take 1 tablet (2 mg total) by mouth every 6 (six) hours as needed for muscle  spasms. 30 tablet 0  . traMADol (ULTRAM) 50 MG tablet Take 1 tablet (50 mg total) by mouth 2 (two) times daily as needed. for pain 60 tablet 3  . venlafaxine XR (EFFEXOR-XR) 75 MG 24 hr capsule TAKE 1 CAPSULE(75 MG) BY MOUTH DAILY WITH BREAKFAST (Patient taking differently: Take 75 mg by mouth daily with breakfast. ) 90 capsule 3  . vitamin B-12 (CYANOCOBALAMIN) 1000 MCG tablet Take 1,000 mcg by mouth daily.    . vitamin C (ASCORBIC ACID) 500 MG tablet Take 500 mg by mouth daily.    [name] HYDROcodone-acetaminophen (NORCO/VICODIN) 5-325 MG tablet Take 1 tablet by mouth daily as needed. 30 tablet 0  . HYDROcodone-acetaminophen (NORCO/VICODIN) 5-325 MG tablet Take 2 tablets by mouth daily as needed. 60 tablet 0  . HYDROcodone-acetaminophen (NORCO/VICODIN) 5-325 MG tablet Take 1 tablet by mouth daily as needed. 30 tablet 0   No facility-administered medications prior to visit.    Review of Systems;  Patient denies headache, fevers, malaise, unintentional weight loss, skin rash, eye pain, sinus congestion and sinus pain, sore throat, dysphagia,  hemoptysis , cough, dyspnea, wheezing, chest pain, palpitations, orthopnea, edema, abdominal pain, nausea, melena, diarrhea, constipation, flank pain, dysuria, hematuria, urinary  Frequency, nocturia, numbness, tingling, seizures,  Focal weakness, Loss of consciousness,  Tremor, insomnia, depression, anxiety, and suicidal ideation.      Objective:  BP 128/64 (BP Location: Left Arm, Patient Position: Sitting, Cuff Size: Normal)   Pulse 68   Temp 97.9 F (36.6 C) (Temporal)   Resp 15   Ht 5\' 3"  (1.6 m)   Wt 163 lb 9.6 oz (74.2 kg)   SpO2 95%   BMI 28.98 kg/m   BP Readings from Last 3 Encounters:  11/04/19 128/64  08/05/19 123/72  05/05/19 133/68    Wt Readings from Last 3 Encounters:  11/04/19 163 lb 9.6 oz (74.2 kg)  08/05/19 160 lb (72.6 kg)  05/05/19 160 lb (72.6 kg)    General appearance: alert, cooperative and appears stated age Ears:  normal TM's and external ear canals both ears Throat: lips, mucosa, and tongue normal; teeth and gums normal Neck: no adenopathy, no carotid bruit, supple, symmetrical, trachea midline and thyroid not enlarged, symmetric, no tenderness/mass/nodules Back: symmetric, no curvature. ROM normal. No CVA tenderness. Lungs: clear to auscultation bilaterally Heart: regular rate and rhythm, S1, S2 normal, no murmur, click, rub or gallop Abdomen: soft, non-tender; bowel sounds normal; no masses,  no organomegaly Pulses: 2+ and symmetric Skin: Skin color, texture, turgor normal. No rashes or lesions Lymph nodes: Cervical, supraclavicular, and axillary nodes normal.  Lab Results  Component Value Date   HGBA1C 5.1 10/26/2015   HGBA1C 5.4 11/25/2013    Lab Results  Component Value Date   CREATININE 1.04 (H) 10/23/2019   CREATININE 1.01 (H) 01/30/2019   CREATININE 0.96 01/29/2019    Lab Results  Component  Value Date   WBC 11.3 (H) 05/08/2019   HGB 10.1 (L) 05/08/2019   HCT 33.4 (L) 05/08/2019   PLT 346 05/08/2019   GLUCOSE 94 10/23/2019   CHOL 107 01/30/2019   TRIG 164 (H) 01/30/2019   HDL 27 (L) 01/30/2019   LDLDIRECT 104 (H) 08/15/2012   LDLCALC 47 01/30/2019   ALT 9 10/23/2019   AST 12 10/23/2019   NA 139 10/23/2019   K 4.6 10/23/2019   CL 106 10/23/2019   CREATININE 1.04 (H) 10/23/2019   BUN 12 10/23/2019   CO2 20 10/23/2019   TSH 1.473 01/29/2019   INR 1.0 09/01/2014   HGBA1C 5.1 10/26/2015    DG Chest 2 View  Result Date: 01/29/2019 CLINICAL DATA:  Chest pain.  Shortness of breath.  Palpitations. EXAM: CHEST - 2 VIEW COMPARISON:  06/23/2018 FINDINGS: The heart size and mediastinal contours are within normal limits. Both lungs are clear. The visualized skeletal structures are unremarkable. IMPRESSION: Normal exam. Electronically Signed   By: Francene Boyers M.D.   On: 01/29/2019 11:07       Assessment & Plan:   Problem List Items Addressed This Visit      Unprioritized     Chronic neck pain    Improving with use of chiropractic manipulation and exercises. Had been referred to [name] , who referred her to [facility] for ESI.  Had No response to first ESI Using vicodin and tramadol (2 each daily) and improving posture using exercises by chiropractor. Continue current regimen       Relevant Medications   HYDROcodone-acetaminophen (NORCO/VICODIN) 5-325 MG tablet (Start on 01/06/2020)   HYDROcodone-acetaminophen (NORCO/VICODIN) 5-325 MG tablet (Start on 12/07/2019)   HYDROcodone-acetaminophen (NORCO/VICODIN) 5-325 MG tablet (Start on 11/07/2019)   Hyperlipidemia    Continue rosuvastatin for goal LDL of  < 70 given presence of coronary and aortic atherosclerosis on prior imaging ,  And low to moderate risk myoview by previous cardiology evaluation for dyspnea  Lab Results  Component Value Date   CHOL 107 01/30/2019   HDL 27 (L) 01/30/2019   LDLCALC 47 01/30/2019   LDLDIRECT 104 (H) 08/15/2012   TRIG 164 (H) 01/30/2019   CHOLHDL 4.0 01/30/2019         Multiple lung nodules    Stable/unchanged by last CT in Feb 2019. No further workup or imaging required       OSA (obstructive sleep apnea)    Untreated due to mask intolerance.   Discussed the long term history of OSA , the risks of long term damage to heart and the signs and symptoms attributable to OSA.  Advised patient to consider  ENT referrral for management of  OSA and use of CPAP.       Osteoarthritis of both shoulders   Relevant Medications   HYDROcodone-acetaminophen (NORCO/VICODIN) 5-325 MG tablet (Start on 01/06/2020)   HYDROcodone-acetaminophen (NORCO/VICODIN) 5-325 MG tablet (Start on 12/07/2019)   HYDROcodone-acetaminophen (NORCO/VICODIN) 5-325 MG tablet (Start on 11/07/2019)    Other Visit Diagnoses    Encounter for screening mammogram for malignant neoplasm of breast    -  Primary   Relevant Orders   MM 3D SCREEN BREAST BILATERAL      I provided  30 minutes of  face-to-face time during  this encounter reviewing patient's current problems and past surgeries, labs and imaging studies, providing counseling on the above mentioned problems , and coordination  of care . I have changed Samara Deist A. Lerch "Kathy"'s HYDROcodone-acetaminophen and HYDROcodone-acetaminophen. I am  also having her maintain her Calcium Carbonate-Vit D-Min (CALCIUM 1200 PO), vitamin C, progesterone, hydrOXYzine, losartan, albuterol, tiZANidine, buPROPion, rosuvastatin, venlafaxine XR, vitamin B-12, docusate sodium, aspirin EC, nitroGLYCERIN, propranolol, rOPINIRole, gabapentin, traMADol, predniSONE, metoprolol succinate, and HYDROcodone-acetaminophen.  Meds ordered this encounter  Medications  . HYDROcodone-acetaminophen (NORCO/VICODIN) 5-325 MG tablet    Sig: Take 1 tablet by mouth 2 (two) times daily as needed.    Dispense:  60 tablet    Refill:  0  . HYDROcodone-acetaminophen (NORCO/VICODIN) 5-325 MG tablet    Sig: Take 2 tablets by mouth daily as needed.    Dispense:  60 tablet    Refill:  0  . HYDROcodone-acetaminophen (NORCO/VICODIN) 5-325 MG tablet    Sig: Take 2 tablets by mouth daily as needed.    Dispense:  60 tablet    Refill:  0    Medications Discontinued During This Encounter  Medication Reason  . HYDROcodone-acetaminophen (NORCO/VICODIN) 5-325 MG tablet Reorder  . HYDROcodone-acetaminophen (NORCO/VICODIN) 5-325 MG tablet Reorder  . HYDROcodone-acetaminophen (NORCO/VICODIN) 5-325 MG tablet Reorder    Follow-up: Return in about 3 months (around 02/04/2020).   Sherlene Shams, MD

## 2019-11-04 NOTE — Assessment & Plan Note (Addendum)
Untreated due to mask intolerance.   Discussed the long term history of OSA , the risks of long term damage to heart and the signs and symptoms attributable to OSA.  Advised patient to consider  ENT referrral for management of  OSA and use of CPAP.

## 2019-11-04 NOTE — Patient Instructions (Addendum)
I have refilled the hydrocodone for max use 2 daily   Please consider seeing ENT to address your untreated sleep apnea

## 2019-11-05 ENCOUNTER — Ambulatory Visit (INDEPENDENT_AMBULATORY_CARE_PROVIDER_SITE_OTHER): Payer: BLUE CROSS/BLUE SHIELD | Admitting: Vascular Surgery

## 2019-11-05 ENCOUNTER — Encounter (INDEPENDENT_AMBULATORY_CARE_PROVIDER_SITE_OTHER): Payer: BLUE CROSS/BLUE SHIELD

## 2019-11-06 NOTE — Assessment & Plan Note (Addendum)
Stable/unchanged by last CT in Feb 2019. No further workup or imaging required

## 2019-11-06 NOTE — Assessment & Plan Note (Signed)
Continue rosuvastatin for goal LDL of  < 70 given presence of coronary and aortic atherosclerosis on prior imaging ,  And low to moderate risk myoview by previous cardiology evaluation for dyspnea  Lab Results  Component Value Date   CHOL 107 01/30/2019   HDL 27 (L) 01/30/2019   LDLCALC 47 01/30/2019   LDLDIRECT 104 (H) 08/15/2012   TRIG 164 (H) 01/30/2019   CHOLHDL 4.0 01/30/2019

## 2019-11-06 NOTE — Assessment & Plan Note (Signed)
Improving with use of chiropractic manipulation and exercises. Had been referred to [name] , who referred her to [facility] for ESI.  Had No response to first ESI Using vicodin and tramadol (2 each daily) and improving posture using exercises by chiropractor. Continue current regimen

## 2019-11-09 DIAGNOSIS — M9903 Segmental and somatic dysfunction of lumbar region: Secondary | ICD-10-CM | POA: Diagnosis not present

## 2019-11-09 DIAGNOSIS — M9902 Segmental and somatic dysfunction of thoracic region: Secondary | ICD-10-CM | POA: Diagnosis not present

## 2019-11-09 DIAGNOSIS — M9901 Segmental and somatic dysfunction of cervical region: Secondary | ICD-10-CM | POA: Diagnosis not present

## 2019-11-11 DIAGNOSIS — M9901 Segmental and somatic dysfunction of cervical region: Secondary | ICD-10-CM | POA: Diagnosis not present

## 2019-11-11 DIAGNOSIS — M9902 Segmental and somatic dysfunction of thoracic region: Secondary | ICD-10-CM | POA: Diagnosis not present

## 2019-11-11 DIAGNOSIS — M9903 Segmental and somatic dysfunction of lumbar region: Secondary | ICD-10-CM | POA: Diagnosis not present

## 2019-11-13 DIAGNOSIS — N951 Menopausal and female climacteric states: Secondary | ICD-10-CM | POA: Diagnosis not present

## 2019-11-13 DIAGNOSIS — R232 Flushing: Secondary | ICD-10-CM | POA: Diagnosis not present

## 2019-11-13 DIAGNOSIS — R5383 Other fatigue: Secondary | ICD-10-CM | POA: Diagnosis not present

## 2019-11-16 DIAGNOSIS — M9902 Segmental and somatic dysfunction of thoracic region: Secondary | ICD-10-CM | POA: Diagnosis not present

## 2019-11-16 DIAGNOSIS — M9901 Segmental and somatic dysfunction of cervical region: Secondary | ICD-10-CM | POA: Diagnosis not present

## 2019-11-16 DIAGNOSIS — M9903 Segmental and somatic dysfunction of lumbar region: Secondary | ICD-10-CM | POA: Diagnosis not present

## 2019-11-18 DIAGNOSIS — M9903 Segmental and somatic dysfunction of lumbar region: Secondary | ICD-10-CM | POA: Diagnosis not present

## 2019-11-18 DIAGNOSIS — M9902 Segmental and somatic dysfunction of thoracic region: Secondary | ICD-10-CM | POA: Diagnosis not present

## 2019-11-18 DIAGNOSIS — M9901 Segmental and somatic dysfunction of cervical region: Secondary | ICD-10-CM | POA: Diagnosis not present

## 2019-11-20 DIAGNOSIS — N951 Menopausal and female climacteric states: Secondary | ICD-10-CM | POA: Diagnosis not present

## 2019-11-20 DIAGNOSIS — R232 Flushing: Secondary | ICD-10-CM | POA: Diagnosis not present

## 2019-11-23 ENCOUNTER — Telehealth: Payer: Self-pay | Admitting: Internal Medicine

## 2019-11-23 ENCOUNTER — Other Ambulatory Visit: Payer: Self-pay | Admitting: Internal Medicine

## 2019-11-23 NOTE — Telephone Encounter (Signed)
Pt called in and stated that her b/p 86/45, weekend she not understanding why her bp is keep dropping

## 2019-11-23 NOTE — Telephone Encounter (Signed)
Please ask her to :  Suspend the losartan.  Continue the metoprolol  Needs a CBC and BMET when she comes for the RN visit

## 2019-11-23 NOTE — Telephone Encounter (Signed)
Spoke with pt and she stated that she has been feeling dizzy for the last week to week and a half. Saturday pt decided to check bp and it was 86/45 but then she stated that after that she could not get her machine to work so she wasn't sure if it was an error or not. Pt stated that she has continued to check her bp and this morning it was low but as the day went on her bp went up and her heart rate. 123/73 HR 113. She stated that she is taking all medication the way it is prescribed. Pt is scheduled for an appt on Wednesday to come in a have orthostatic vitals checked because she gets really dizzy when bending over.

## 2019-11-24 DIAGNOSIS — M9903 Segmental and somatic dysfunction of lumbar region: Secondary | ICD-10-CM | POA: Diagnosis not present

## 2019-11-24 DIAGNOSIS — M9901 Segmental and somatic dysfunction of cervical region: Secondary | ICD-10-CM | POA: Diagnosis not present

## 2019-11-24 DIAGNOSIS — M9902 Segmental and somatic dysfunction of thoracic region: Secondary | ICD-10-CM | POA: Diagnosis not present

## 2019-11-24 NOTE — Telephone Encounter (Signed)
Spoke with pt and advised her to stop taking the losartan but to continue taking the metoprolol. Pt stated that she did not take it today and has not had an dizzy spells.

## 2019-11-24 NOTE — Telephone Encounter (Signed)
Refill request for tramadol, last seen 11-04-19, last filled 06-19-19.  Please advise.

## 2019-11-25 ENCOUNTER — Ambulatory Visit: Payer: BC Managed Care – PPO | Admitting: Internal Medicine

## 2019-11-25 ENCOUNTER — Encounter: Payer: Self-pay | Admitting: Internal Medicine

## 2019-11-25 ENCOUNTER — Other Ambulatory Visit: Payer: Self-pay

## 2019-11-25 VITALS — BP 122/72 | HR 66 | Temp 97.9°F | Ht 62.99 in | Wt 158.8 lb

## 2019-11-25 DIAGNOSIS — G2581 Restless legs syndrome: Secondary | ICD-10-CM

## 2019-11-25 DIAGNOSIS — D508 Other iron deficiency anemias: Secondary | ICD-10-CM

## 2019-11-25 DIAGNOSIS — I959 Hypotension, unspecified: Secondary | ICD-10-CM | POA: Diagnosis not present

## 2019-11-25 DIAGNOSIS — I951 Orthostatic hypotension: Secondary | ICD-10-CM

## 2019-11-25 MED ORDER — ROPINIROLE HCL 0.5 MG PO TABS: 0.5000 mg | ORAL_TABLET | Freq: Three times a day (TID) | ORAL | 2 refills | Status: DC

## 2019-11-25 NOTE — Assessment & Plan Note (Signed)
Recurrent ,  Continue to suspend losartan,  Reduce metoprolol to 12.5 mg to avoid rebound tachycardia .  CBC BMET, iron cortisol (random) all pending.  Increase salt intake

## 2019-11-25 NOTE — Progress Notes (Signed)
Subjective:  Patient ID: Cheryl [name], female    DOB: 1955/10/12  Age: 64 y.o. MRN: [id number]  CC: The primary encounter diagnosis was Orthostatic hypotension. Diagnoses of Other iron deficiency anemia and Restless legs were also pertinent to this visit.  HPI Cheryl [name] presents for evaluation of dizziness and hypotension.   This visit occurred during the SARS-CoV-2 public health emergency.  Safety protocols were in place, including screening questions prior to the visit, additional usage of staff PPE, and extensive cleaning of exam room while observing appropriate contact time as indicated for disinfecting solutions.   64 yr old female with history of hypertension, treated with losartan and metoprolol, and IDA/thalassemia, managed by Hematology.  Was advised to stop losartan 2 days ago but continue the metoprolol.  She reports continued symptoms and a low BP of 86/45 yesterday with presyncopal symptoms.  BP was 109/66 at home,  Several hours later 114/72,  Then at 4:00 124/72  With a pulse of 103. Takes toprol XL 25 mg in the afternoon  First bad day was Saturday.  Had been standing outside , bent over and felt dizzy when she stood back up.  Also occurring whenever he stands up suddenly from the couch. . Had worked in the yard on Saturday ,  Got overheated.  Has not been eating as much.  Appetite has been "off" because husband has not been cooking since his teeth were pulled.      Legs more restless than ever despite using requip  Sciatica worse despite going to chiropractor.    Outpatient Medications Prior to Visit  Medication Sig Dispense Refill  . albuterol (PROVENTIL HFA;VENTOLIN HFA) 108 (90 Base) MCG/ACT inhaler Inhale 2 puffs into the lungs every 6 (six) hours as needed for wheezing or shortness of breath. 1 Inhaler 0  . aspirin EC 81 MG tablet Take 1 tablet (81 mg total) by mouth daily.    [name] buPROPion (WELLBUTRIN SR) 100 MG 12 hr tablet TAKE 1 TABLET BY MOUTH TWO   TIMES DAILY (Patient taking differently: Take 100 mg by mouth 2 (two) times daily. ) 180 tablet 3  . Calcium Carbonate-Vit D-Min (CALCIUM 1200 PO) Take 1 tablet by mouth daily.    [name] docusate sodium (COLACE) 100 MG capsule Take 100 mg by mouth daily as needed for mild constipation.    . gabapentin (NEURONTIN) 300 MG capsule TAKE 1 CAPSULE BY MOUTH 3  TIMES DAILY 90 capsule 11  . [START ON 01/06/2020] HYDROcodone-acetaminophen (NORCO/VICODIN) 5-325 MG tablet Take 1 tablet by mouth 2 (two) times daily as needed. 60 tablet 0  . [START ON 12/07/2019] HYDROcodone-acetaminophen (NORCO/VICODIN) 5-325 MG tablet Take 2 tablets by mouth daily as needed. 60 tablet 0  . HYDROcodone-acetaminophen (NORCO/VICODIN) 5-325 MG tablet Take 2 tablets by mouth daily as needed. 60 tablet 0  . hydrOXYzine (ATARAX/VISTARIL) 25 MG tablet TAKE 1 TABLET (25 MG TOTAL) BY MOUTH 3 (THREE) TIMES DAILY AS NEEDED FOR ITCHING. 90 tablet 3  . losartan (COZAAR) 50 MG tablet Take 50 mg by mouth daily.    . metoprolol succinate (TOPROL-XL) 25 MG 24 hr tablet Take 1 tablet (25 mg total) by mouth daily. 90 tablet 3  . nitroGLYCERIN (NITROSTAT) 0.4 MG SL tablet Place 1 tablet (0.4 mg total) under the tongue every 5 (five) minutes as needed for chest pain. 25 tablet 3  . predniSONE (DELTASONE) 50 MG tablet Take 1 tablet (50 mg total) by mouth daily. 5 tablet 0  . progesterone (PROMETRIUM)  200 MG capsule Take 200 mg by mouth at bedtime.     . propranolol (INDERAL) 10 MG tablet Take 1 tablet (10 mg total) by mouth 3 (three) times daily as needed. 90 tablet 1  . rosuvastatin (CRESTOR) 10 MG tablet Take 1 tablet (10 mg total) by mouth daily. 90 tablet 3  . tiZANidine (ZANAFLEX) 2 MG tablet Take 1 tablet (2 mg total) by mouth every 6 (six) hours as needed for muscle spasms. 30 tablet 0  . traMADol (ULTRAM) 50 MG tablet TAKE 1 TABLET BY MOUTH  TWICE DAILY AS NEEDED FOR  PAIN 60 tablet 5  . venlafaxine XR (EFFEXOR-XR) 75 MG 24 hr capsule TAKE 1  CAPSULE(75 MG) BY MOUTH DAILY WITH BREAKFAST (Patient taking differently: Take 75 mg by mouth daily with breakfast. ) 90 capsule 3  . vitamin C (ASCORBIC ACID) 500 MG tablet Take 500 mg by mouth daily.    [name] rOPINIRole (REQUIP) 0.5 MG tablet 1  tablets at bedtime for restless legs 90 tablet 2  . vitamin B-12 (CYANOCOBALAMIN) 1000 MCG tablet Take 1,000 mcg by mouth daily. (Patient not taking: Reported on 11/25/2019)     No facility-administered medications prior to visit.    Review of Systems;  Patient denies headache, fevers, malaise, unintentional weight loss, skin rash, eye pain, sinus congestion and sinus pain, sore throat, dysphagia,  hemoptysis , cough, dyspnea, wheezing, chest pain, palpitations, orthopnea, edema, abdominal pain, nausea, melena, diarrhea, constipation, flank pain, dysuria, hematuria, urinary  Frequency, nocturia, numbness, tingling, seizures,  Focal weakness, Loss of consciousness,  Tremor, insomnia, depression, anxiety, and suicidal ideation.      Objective:  BP 122/72 (BP Location: Left Arm, Patient Position: Sitting)   Pulse 66   Temp 97.9 F (36.6 C)   Ht 5' 2.99" (1.6 m)   Wt 158 lb 12.8 oz (72 kg)   SpO2 98%   BMI 28.14 kg/m   BP Readings from Last 3 Encounters:  11/25/19 122/72  11/04/19 128/64  08/05/19 123/72    Wt Readings from Last 3 Encounters:  11/25/19 158 lb 12.8 oz (72 kg)  11/04/19 163 lb 9.6 oz (74.2 kg)  08/05/19 160 lb (72.6 kg)    General appearance: alert, cooperative and appears stated age Ears: normal TM's and external ear canals both ears Throat: lips, mucosa, and tongue normal; teeth and gums normal Neck: no adenopathy, no carotid bruit, supple, symmetrical, trachea midline and thyroid not enlarged, symmetric, no tenderness/mass/nodules Back: symmetric, no curvature. ROM normal. No CVA tenderness. Lungs: clear to auscultation bilaterally Heart: regular rate and rhythm, S1, S2 normal, no murmur, click, rub or gallop Abdomen:  soft, non-tender; bowel sounds normal; no masses,  no organomegaly Pulses: 2+ and symmetric Skin: Skin color, texture, turgor normal. No rashes or lesions Lymph nodes: Cervical, supraclavicular, and axillary nodes normal.  Lab Results  Component Value Date   HGBA1C 5.1 10/26/2015   HGBA1C 5.4 11/25/2013    Lab Results  Component Value Date   CREATININE 1.04 (H) 10/23/2019   CREATININE 1.01 (H) 01/30/2019   CREATININE 0.96 01/29/2019    Lab Results  Component Value Date   WBC 11.3 (H) 05/08/2019   HGB 10.1 (L) 05/08/2019   HCT 33.4 (L) 05/08/2019   PLT 346 05/08/2019   GLUCOSE 94 10/23/2019   CHOL 107 01/30/2019   TRIG 164 (H) 01/30/2019   HDL 27 (L) 01/30/2019   LDLDIRECT 104 (H) 08/15/2012   LDLCALC 47 01/30/2019   ALT 9 10/23/2019   AST  12 10/23/2019   NA 139 10/23/2019   K 4.6 10/23/2019   CL 106 10/23/2019   CREATININE 1.04 (H) 10/23/2019   BUN 12 10/23/2019   CO2 20 10/23/2019   TSH 1.473 01/29/2019   INR 1.0 09/01/2014   HGBA1C 5.1 10/26/2015    DG Chest 2 View  Result Date: 01/29/2019 CLINICAL DATA:  Chest pain.  Shortness of breath.  Palpitations. EXAM: CHEST - 2 VIEW COMPARISON:  06/23/2018 FINDINGS: The heart size and mediastinal contours are within normal limits. Both lungs are clear. The visualized skeletal structures are unremarkable. IMPRESSION: Normal exam. Electronically Signed   By: Francene BoyersJames  Maxwell M.D.   On: 01/29/2019 11:07   CT ANGIO CHEST PE W OR WO CONTRAST  Result Date: 01/30/2019 CLINICAL DATA:  Chest tightness and shortness of breath for 3 days. EXAM: CT ANGIOGRAPHY CHEST WITH CONTRAST TECHNIQUE: Multidetector CT imaging of the chest was performed using the standard protocol during bolus administration of intravenous contrast. Multiplanar CT image reconstructions and MIPs were obtained to evaluate the vascular anatomy. CONTRAST:  75 mL OMNIPAQUE IOHEXOL 350 MG/ML SOLN COMPARISON:  PA and lateral chest earlier today. CT chest 01/29/2019. CT chest  06/01/2015. FINDINGS: Cardiovascular: Satisfactory opacification of the pulmonary arteries to the segmental level. No evidence of pulmonary embolism. Normal heart size. No pericardial effusion. Calcific aortic and coronary atherosclerosis noted. Mediastinum/Nodes: No enlarged mediastinal, hilar, or axillary lymph nodes. Thyroid gland, trachea, and esophagus demonstrate no significant findings. Lungs/Pleura: No pleural effusion. There is mild dependent atelectasis. 0.3 cm nodular opacity along the major fissure in the left upper on image 22. 0.2 cm nodular opacity right upper lobe on image 26 of series 5. Nodule is stable since 2017. Emphysematous change in the apices noted. Upper Abdomen: Negative. Musculoskeletal: Negative. Review of the MIP images confirms the above findings. IMPRESSION: Negative for pulmonary embolus. No acute disease or finding to explain the patient's symptoms. Aortic Atherosclerosis (ICD10-I70.0) and Emphysema (ICD10-J43.9). Electronically Signed   By: Drusilla Kannerhomas  Dalessio M.D.   On: 01/30/2019 13:51   NM Myocar Multi W/Spect W/Wall Motion / EF  Addendum Date: 01/30/2019   Pharmacological myocardial perfusion imaging study with no significant  Ischemia Fixed defect in the anteroseptal, apical region, likely attenuation artifact/bundle branch block, though unable to exclude prior MI Anteroseptal wall hypokinesis, secondary to bundle branch block,  EF estimated at 40% No EKG changes concerning for ischemia at peak stress or in recovery. Baseline EKG with LBBB Low risk scan Study compared to prior study dated 10/2010, no significant change noted, prior defect detailed above secondary to LBBB was previously noted. Signed, Dossie Arbourim Gollan, MD, Ph.D [facility]   Result Date: 01/30/2019 Pharmacological myocardial perfusion imaging study with no significant  Ischemia Fixed defect in the anteroseptal, apical region, likely attenuation artifact/bundle branch block, though unable to exclude prior MI  Anteroseptal wall hypokinesis, secondary to bundle branch block,  EF estimated at 40% No EKG changes concerning for ischemia at peak stress or in recovery. Baseline EKG with LBBB Low to moderate risk scan Signed, Dossie Arbourim Gollan, MD, Ph.D [facility]    Assessment & Plan:   Problem List Items Addressed This Visit      Unprioritized   Restless legs    Symptoms have worsened and now occurring in the daytime.  Checking iron stores again,  Increase requip to bid.       Orthostatic hypotension - Primary    Recurrent ,  Continue to suspend losartan,  Reduce metoprolol to 12.5 mg to  avoid rebound tachycardia .  CBC BMET, iron cortisol (random) all pending.  Increase salt intake       Relevant Orders   CBC with Differential/Platelet   Comprehensive metabolic panel   Cortisol   Iron deficiency anemia   Relevant Orders   Iron, TIBC and Ferritin Panel      I have discontinued Samara Deist A. Darnold "Kathy"'s vitamin B-12. I have also changed her rOPINIRole. Additionally, I am having her maintain her Calcium Carbonate-Vit D-Min (CALCIUM 1200 PO), vitamin C, progesterone, hydrOXYzine, losartan, albuterol, tiZANidine, buPROPion, rosuvastatin, venlafaxine XR, docusate sodium, aspirin EC, nitroGLYCERIN, propranolol, gabapentin, predniSONE, metoprolol succinate, HYDROcodone-acetaminophen, HYDROcodone-acetaminophen, HYDROcodone-acetaminophen, and traMADol.  Meds ordered this encounter  Medications  . rOPINIRole (REQUIP) 0.5 MG tablet    Sig: Take 1 tablet (0.5 mg total) by mouth 3 (three) times daily. 1  tablets at bedtime for restless legs    Dispense:  90 tablet    Refill:  2    Medications Discontinued During This Encounter  Medication Reason  . vitamin B-12 (CYANOCOBALAMIN) 1000 MCG tablet Completed Course  . rOPINIRole (REQUIP) 0.5 MG tablet     Follow-up: No follow-ups on file.   Sherlene Shams, MD

## 2019-11-25 NOTE — Patient Instructions (Addendum)
Reduce the metoprolol the 1/2 tablet daily  SALT LOAD!!   Increase the Requip to two times daily for your restless legs    Orthostatic Hypotension Blood pressure is a measurement of how strongly, or weakly, your blood is pressing against the walls of your arteries. Orthostatic hypotension is a sudden drop in blood pressure that happens when you quickly change positions, such as when you get up from sitting or lying down. Arteries are blood vessels that carry blood from your heart throughout your body. When blood pressure is too low, you may not get enough blood to your brain or to the rest of your organs. This can cause weakness, light-headedness, rapid heartbeat, and fainting. This can last for just a few seconds or for up to a few minutes. Orthostatic hypotension is usually not a serious problem. However, if it happens frequently or gets worse, it may be a sign of something more serious. What are the causes? This condition may be caused by:  Sudden changes in posture, such as standing up quickly after you have been sitting or lying down.  Blood loss.  Loss of body fluids (dehydration).  Heart problems.  Hormone (endocrine) problems.  Pregnancy.  Severe infection.  Lack of certain nutrients.  Severe allergic reactions (anaphylaxis).  Certain medicines, such as blood pressure medicine or medicines that make the body lose excess fluids (diuretics). Sometimes, this condition can be caused by not taking medicine as directed, such as taking too much of a certain medicine. What increases the risk? The following factors may make you more likely to develop this condition:  Age. Risk increases as you get older.  Conditions that affect the heart or the central nervous system.  Taking certain medicines, such as blood pressure medicine or diuretics.  Being pregnant. What are the signs or symptoms? Symptoms of this condition may  include:  Weakness.  Light-headedness.  Dizziness.  Blurred vision.  Fatigue.  Rapid heartbeat.  Fainting, in severe cases. How is this diagnosed? This condition is diagnosed based on:  Your medical history.  Your symptoms.  Your blood pressure measurement. Your health care provider will check your blood pressure when you are: ? Lying down. ? Sitting. ? Standing. A blood pressure reading is recorded as two numbers, such as "120 over 80" (or 120/80). The first ("top") number is called the systolic pressure. It is a measure of the pressure in your arteries as your heart beats. The second ("bottom") number is called the diastolic pressure. It is a measure of the pressure in your arteries when your heart relaxes between beats. Blood pressure is measured in a unit called mm Hg. Healthy blood pressure for most adults is 120/80. If your blood pressure is below 90/60, you may be diagnosed with hypotension. Other information or tests that may be used to diagnose orthostatic hypotension include:  Your other vital signs, such as your heart rate and temperature.  Blood tests.  Tilt table test. For this test, you will be safely secured to a table that moves you from a lying position to an upright position. Your heart rhythm and blood pressure will be monitored during the test. How is this treated? This condition may be treated by:  Changing your diet. This may involve eating more salt (sodium) or drinking more water.  Taking medicines to raise your blood pressure.  Changing the dosage of certain medicines you are taking that might be lowering your blood pressure.  Wearing compression stockings. These stockings help to prevent blood  clots and reduce swelling in your legs. In some cases, you may need to go to the hospital for:  Fluid replacement. This means you will receive fluids through an IV.  Blood replacement. This means you will receive donated blood through an IV  (transfusion).  Treating an infection or heart problems, if this applies.  Monitoring. You may need to be monitored while medicines that you are taking wear off. Follow these instructions at home: Eating and drinking   Drink enough fluid to keep your urine pale yellow.  Eat a healthy diet, and follow instructions from your health care provider about eating or drinking restrictions. A healthy diet includes: ? Fresh fruits and vegetables. ? Whole grains. ? Lean meats. ? Low-fat dairy products.  Eat extra salt only as directed. Do not add extra salt to your diet unless your health care provider told you to do that.  Eat frequent, small meals.  Avoid standing up suddenly after eating. Medicines  Take over-the-counter and prescription medicines only as told by your health care provider. ? Follow instructions from your health care provider about changing the dosage of your current medicines, if this applies. ? Do not stop or adjust any of your medicines on your own. General instructions   Wear compression stockings as told by your health care provider.  Get up slowly from lying down or sitting positions. This gives your blood pressure a chance to adjust.  Avoid hot showers and excessive heat as directed by your health care provider.  Return to your normal activities as told by your health care provider. Ask your health care provider what activities are safe for you.  Do not use any products that contain nicotine or tobacco, such as cigarettes, e-cigarettes, and chewing tobacco. If you need help quitting, ask your health care provider.  Keep all follow-up visits as told by your health care provider. This is important. Contact a health care provider if you:  Vomit.  Have diarrhea.  Have a fever for more than 2-3 days.  Feel more thirsty than usual.  Feel weak and tired. Get help right away if you:  Have chest pain.  Have a fast or irregular heartbeat.  Develop  numbness in any part of your body.  Cannot move your arms or your legs.  Have trouble speaking.  Become sweaty or feel light-headed.  Faint.  Feel short of breath.  Have trouble staying awake.  Feel confused. Summary  Orthostatic hypotension is a sudden drop in blood pressure that happens when you quickly change positions.  Orthostatic hypotension is usually not a serious problem.  It is diagnosed by having your blood pressure taken lying down, sitting, and then standing.  It may be treated by changing your diet or adjusting your medicines. This information is not intended to replace advice given to you by your health care provider. Make sure you discuss any questions you have with your health care provider. Document Revised: 11/07/2017 Document Reviewed: 11/07/2017 Elsevier Patient Education  2020 [employer].

## 2019-11-25 NOTE — Assessment & Plan Note (Signed)
Symptoms have worsened and now occurring in the daytime.  Checking iron stores again,  Increase requip to bid.

## 2019-11-27 LAB — CBC WITH DIFFERENTIAL/PLATELET
Basophils Absolute: 0.1 10*3/uL (ref 0.0–0.2)
Basos: 1 %
EOS (ABSOLUTE): 0.3 10*3/uL (ref 0.0–0.4)
Eos: 2 %
Hematocrit: 31.7 % — ABNORMAL LOW (ref 34.0–46.6)
Hemoglobin: 9.9 g/dL — ABNORMAL LOW (ref 11.1–15.9)
Immature Grans (Abs): 0 10*3/uL (ref 0.0–0.1)
Immature Granulocytes: 0 %
Lymphocytes Absolute: 3.3 10*3/uL — ABNORMAL HIGH (ref 0.7–3.1)
Lymphs: 29 %
MCH: 21.8 pg — ABNORMAL LOW (ref 26.6–33.0)
MCHC: 31.2 g/dL — ABNORMAL LOW (ref 31.5–35.7)
MCV: 70 fL — ABNORMAL LOW (ref 79–97)
Monocytes Absolute: 0.9 10*3/uL (ref 0.1–0.9)
Monocytes: 8 %
NRBC: 1 % — ABNORMAL HIGH (ref 0–0)
Neutrophils Absolute: 6.7 10*3/uL (ref 1.4–7.0)
Neutrophils: 60 %
Platelets: 291 10*3/uL (ref 150–450)
RBC: 4.54 x10E6/uL (ref 3.77–5.28)
RDW: 18.1 % — ABNORMAL HIGH (ref 11.7–15.4)
WBC: 11.2 10*3/uL — ABNORMAL HIGH (ref 3.4–10.8)

## 2019-11-27 LAB — IRON,TIBC AND FERRITIN PANEL
Ferritin: 339 ng/mL — ABNORMAL HIGH (ref 15–150)
Iron Saturation: 31 % (ref 15–55)
Iron: 85 ug/dL (ref 27–139)
Total Iron Binding Capacity: 270 ug/dL (ref 250–450)
UIBC: 185 ug/dL (ref 118–369)

## 2019-11-27 LAB — CORTISOL: Cortisol: 6.4 ug/dL

## 2019-11-27 LAB — COMPREHENSIVE METABOLIC PANEL
ALT: 12 IU/L (ref 0–32)
AST: 15 IU/L (ref 0–40)
Albumin/Globulin Ratio: 2.6 — ABNORMAL HIGH (ref 1.2–2.2)
Albumin: 4.5 g/dL (ref 3.8–4.8)
Alkaline Phosphatase: 92 IU/L (ref 48–121)
BUN/Creatinine Ratio: 13 (ref 12–28)
BUN: 14 mg/dL (ref 8–27)
Bilirubin Total: 0.5 mg/dL (ref 0.0–1.2)
CO2: 22 mmol/L (ref 20–29)
Calcium: 9.2 mg/dL (ref 8.7–10.3)
Chloride: 106 mmol/L (ref 96–106)
Creatinine, Ser: 1.07 mg/dL — ABNORMAL HIGH (ref 0.57–1.00)
GFR calc Af Amer: 63 mL/min/{1.73_m2} (ref >59)
GFR calc non Af Amer: 55 mL/min/{1.73_m2} — ABNORMAL LOW (ref >59)
Globulin, Total: 1.7 g/dL (ref 1.5–4.5)
Glucose: 94 mg/dL (ref 65–99)
Potassium: 4.8 mmol/L (ref 3.5–5.2)
Sodium: 140 mmol/L (ref 134–144)
Total Protein: 6.2 g/dL (ref 6.0–8.5)

## 2019-12-01 DIAGNOSIS — M9901 Segmental and somatic dysfunction of cervical region: Secondary | ICD-10-CM | POA: Diagnosis not present

## 2019-12-01 DIAGNOSIS — M9902 Segmental and somatic dysfunction of thoracic region: Secondary | ICD-10-CM | POA: Diagnosis not present

## 2019-12-01 DIAGNOSIS — M9903 Segmental and somatic dysfunction of lumbar region: Secondary | ICD-10-CM | POA: Diagnosis not present

## 2019-12-03 ENCOUNTER — Telehealth: Payer: Self-pay | Admitting: Internal Medicine

## 2019-12-03 MED ORDER — ROPINIROLE HCL 0.5 MG PO TABS: 0.5000 mg | ORAL_TABLET | Freq: Three times a day (TID) | ORAL | 2 refills | Status: DC

## 2019-12-03 NOTE — Telephone Encounter (Signed)
[facility] called want to know about how she is to take medication not sure how many a day and at night, [facility] [place] [phone] please

## 2019-12-03 NOTE — Telephone Encounter (Signed)
Pharmacy is needing clarification on the ropinirole directions. There is two directions on current rx.

## 2019-12-04 ENCOUNTER — Other Ambulatory Visit: Payer: Self-pay | Admitting: Internal Medicine

## 2019-12-10 ENCOUNTER — Telehealth: Payer: Self-pay | Admitting: Internal Medicine

## 2019-12-10 ENCOUNTER — Ambulatory Visit (INDEPENDENT_AMBULATORY_CARE_PROVIDER_SITE_OTHER): Payer: BC Managed Care – PPO | Admitting: Vascular Surgery

## 2019-12-10 ENCOUNTER — Encounter (INDEPENDENT_AMBULATORY_CARE_PROVIDER_SITE_OTHER): Payer: Self-pay | Admitting: Vascular Surgery

## 2019-12-10 ENCOUNTER — Other Ambulatory Visit: Payer: Self-pay

## 2019-12-10 ENCOUNTER — Ambulatory Visit (INDEPENDENT_AMBULATORY_CARE_PROVIDER_SITE_OTHER): Payer: BC Managed Care – PPO

## 2019-12-10 VITALS — BP 143/80 | HR 63 | Resp 16 | Ht 63.0 in | Wt 161.0 lb

## 2019-12-10 DIAGNOSIS — I728 Aneurysm of other specified arteries: Secondary | ICD-10-CM

## 2019-12-10 DIAGNOSIS — I701 Atherosclerosis of renal artery: Secondary | ICD-10-CM | POA: Diagnosis not present

## 2019-12-10 DIAGNOSIS — I771 Stricture of artery: Secondary | ICD-10-CM | POA: Insufficient documentation

## 2019-12-10 DIAGNOSIS — I6523 Occlusion and stenosis of bilateral carotid arteries: Secondary | ICD-10-CM | POA: Diagnosis not present

## 2019-12-10 DIAGNOSIS — I774 Celiac artery compression syndrome: Secondary | ICD-10-CM | POA: Insufficient documentation

## 2019-12-10 DIAGNOSIS — M19011 Primary osteoarthritis, right shoulder: Secondary | ICD-10-CM

## 2019-12-10 DIAGNOSIS — I739 Peripheral vascular disease, unspecified: Secondary | ICD-10-CM

## 2019-12-10 MED ORDER — HYDROCODONE-ACETAMINOPHEN 5-325 MG PO TABS: 1.0000 | ORAL_TABLET | Freq: Two times a day (BID) | ORAL | 0 refills | Status: DC | PRN

## 2019-12-10 MED ORDER — HYDROCODONE-ACETAMINOPHEN 5-325 MG PO TABS: 2.0000 | ORAL_TABLET | Freq: Every day | ORAL | 0 refills | Status: DC | PRN

## 2019-12-10 NOTE — Telephone Encounter (Signed)
July and August refills have been resent to [facility]

## 2019-12-10 NOTE — Telephone Encounter (Signed)
Pt states that pharmacy does not have rx for HYDROcodone-acetaminophen (NORCO/VICODIN) 5-325 MG tablet for this month. Please advise

## 2019-12-10 NOTE — Addendum Note (Signed)
Addended by: Sherlene Shams on: 12/10/2019 08:48 AM   Modules accepted: Orders

## 2019-12-10 NOTE — Progress Notes (Signed)
MRN : [id number]  Cheryl [name] is a 64 y.o. (04-26-1956) female who presents with chief complaint of  Chief Complaint  Patient presents with  . Follow-up    1 yr ultrasound  .  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 The patient denies abdominal pain or postprandial symptoms. The patient denies weight loss as well as nausea. The patient does not substantiate food fear, particular foods do not seem to aggravate or alleviate the symptoms. The patient denies bloody bowel movements or diarrhea. No history of peptic ulcer disease.   Recently she has stopped taking Losartan because of hypotension and dizziness  No prior peripheral angiograms or vascular interventions.  The patient denies amaurosis fugax or recent TIA symptoms. There are no recent neurological changes noted. The patient denies worsening claudication symptoms No rest pain symptoms.  The patient denies history of DVT, PE or superficial thrombophlebitis. The patient denies recent episodes of angina  Duplex ultrasound shows mild to moderate plaque at the origin of the celiac and minimal plaque/stenosis of the SMA.  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 albuterol (PROVENTIL HFA;VENTOLIN HFA) 108 (90 Base) MCG/ACT inhaler Inhale 2 puffs into the lungs every 6 (six) hours as needed for wheezing or shortness of breath.  [name] aspirin EC 81 MG tablet Take 1 tablet (81 mg total) by mouth daily.  [name] buPROPion (WELLBUTRIN SR) 100 MG 12 hr tablet TAKE 1 TABLET BY MOUTH TWO  TIMES DAILY (Patient taking differently: Take 100 mg by mouth 2 (two) times daily. )  . Calcium Carbonate-Vit D-Min (CALCIUM 1200 PO) Take 1 tablet by mouth daily.  [name] docusate sodium (COLACE) 100 MG capsule Take 100 mg by mouth daily as  needed for mild constipation.  . gabapentin (NEURONTIN) 300 MG capsule TAKE 1 CAPSULE BY MOUTH 3  TIMES DAILY  . HYDROcodone-acetaminophen (NORCO/VICODIN) 5-325 MG tablet Take 2 tablets by mouth daily as needed.  [name] HYDROcodone-acetaminophen (NORCO/VICODIN) 5-325 MG tablet Take 2 tablets by mouth daily as needed.  Melene Muller ON 01/06/2020] HYDROcodone-acetaminophen (NORCO/VICODIN) 5-325 MG tablet Take 1 tablet by mouth 2 (two) times daily as needed.  . hydrOXYzine (ATARAX/VISTARIL) 25 MG tablet TAKE 1 TABLET (25 MG TOTAL) BY MOUTH 3 (THREE) TIMES DAILY AS NEEDED FOR ITCHING.  . metoprolol succinate (TOPROL-XL) 25 MG 24 hr tablet Take 1 tablet (25 mg total) by mouth daily.  . nitroGLYCERIN (NITROSTAT) 0.4 MG SL tablet Place 1 tablet (0.4 mg total) under the tongue every 5 (five) minutes as needed for chest pain.  . predniSONE (DELTASONE) 50 MG tablet Take 1 tablet (50 mg total) by mouth daily.  . progesterone (PROMETRIUM) 200 MG capsule Take 200 mg by mouth at bedtime.   . propranolol (INDERAL) 10 MG tablet Take 1 tablet (10 mg total) by mouth 3 (three) times daily as needed.  [name] rOPINIRole (REQUIP) 0.5 MG tablet Take 1 tablet (0.5 mg total) by mouth 3 (three) times daily.  . rosuvastatin (CRESTOR) 10 MG tablet TAKE 1 TABLET BY MOUTH  DAILY  . tiZANidine (ZANAFLEX) 2 MG tablet Take 1 tablet (2 mg total) by mouth every 6 (six) hours as needed for muscle spasms.  . traMADol (ULTRAM) 50 MG tablet TAKE 1 TABLET BY MOUTH  TWICE DAILY AS NEEDED FOR  PAIN  .  venlafaxine XR (EFFEXOR-XR) 75 MG 24 hr capsule TAKE 1 CAPSULE(75 MG) BY MOUTH DAILY WITH BREAKFAST (Patient taking differently: Take 75 mg by mouth daily with breakfast. )  . vitamin C (ASCORBIC ACID) 500 MG tablet Take 500 mg by mouth daily.    Past Medical History:  Diagnosis Date  . 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 Arrhythmia    left bundle branch block  . Arthritis   . Blood in stool   . Chicken pox   . Generalized headaches   . History of blood transfusion   . Kidney disease, chronic, stage III (GFR 30-59 ml/min)   . LBBB (left bundle branch block)   . Renal disorder   . Thyroid disease   . UTI (lower urinary tract infection)     Past Surgical History:  Procedure Laterality Date  . ABDOMINAL HYSTERECTOMY  1997  . APPENDECTOMY  1981  . BREAST EXCISIONAL BIOPSY Bilateral 1976   neg  . BREAST SURGERY  1976  . CARDIAC CATHETERIZATION  2004   [facility]  . CARDIAC CATHETERIZATION  2006   [facility]  . cystic fibrosis tumor removal  1983  . THUMB AMPUTATION  1992   traumatic  . TONSILLECTOMY AND ADENOIDECTOMY  1964    Social History Social History   Tobacco Use  . Smoking status: Current Every Day Smoker    Packs/day: 0.25    Years: 34.00    Pack years: 8.50    Types: Cigarettes    Last attempt to quit: 04/10/2013    Years since quitting: 6.6  . Smokeless tobacco: Never Used  Substance Use Topics  . Alcohol use: Yes    Comment: occasional  . Drug use: No    Family History Family History  Problem Relation Age of Onset  . Heart disease Mother   . Arthritis Mother   . Cancer Mother        breast  . Hyperlipidemia Mother   . Hypertension Mother   . Heart attack Mother   . Breast cancer Mother [id number]  . Heart disease Father   . Diabetes Father   . Arthritis Father   . Hypertension Father   . Parkinson's disease Father   . Hodgkin's lymphoma Father        hodgkins disease, prostate  . Heart disease Sister   . Diabetes Sister   . Heart disease Brother [id number]       ami x 8,  4 vessel CABG   . Diabetes Brother   . Liver disease Brother   . Lung disease Brother   . Heart disease Maternal Grandmother   . Diabetes Maternal Grandmother   . Heart disease Maternal Grandfather   . Heart disease Paternal Grandmother   . Diabetes Paternal Grandmother   . Stroke Paternal Grandfather    . Heart disease Paternal Grandfather   . Diabetes Paternal Grandfather   . Diabetes Maternal Aunt     Allergies  Allergen Reactions  . Peanuts [Peanut Oil]   . Penicillins Other (See Comments)    Hives,rash,nausea,swelling,   . Sulfa Antibiotics Other (See Comments)    Hives,rash,nausea,swelling  . Influenza Vac Split [Flu Virus Vaccine]     Pleurisy  1996  . Mobic [Meloxicam] Nausea Only  . Nickel      REVIEW OF SYSTEMS (Negative unless checked)  Constitutional: [] Weight loss  [] Fever  [] Chills Cardiac: []   Chest pain   [] Chest pressure   [] Palpitations   [] Shortness of breath when laying flat   [] Shortness of breath with exertion. Vascular:  [] Pain in legs with walking   [] Pain in legs at rest  [] History of DVT   [] Phlebitis   [] Swelling in legs   [] Varicose veins   [] Non-healing ulcers Pulmonary:   [] Uses home oxygen   [] Productive cough   [] Hemoptysis   [] Wheeze  [] COPD   [] Asthma Neurologic:  [] Dizziness   [] Seizures   [] History of stroke   [] History of TIA  [] Aphasia   [] Vissual changes   [] Weakness or numbness in arm   [] Weakness or numbness in leg Musculoskeletal:   [] Joint swelling   [] Joint pain   [] Low back pain Hematologic:  [] Easy bruising  [] Easy bleeding   [] Hypercoagulable state   [] Anemic Gastrointestinal:  [] Diarrhea   [] Vomiting  [] Gastroesophageal reflux/heartburn   [] Difficulty swallowing. Genitourinary:  [] Chronic kidney disease   [] Difficult urination  [] Frequent urination   [] Blood in urine Skin:  [] Rashes   [] Ulcers  Psychological:  [] History of anxiety   []  History of major depression.  Physical Examination  Vitals:   12/10/19 1021  BP: (!) 143/80  Pulse: 63  Resp: 16  Weight: 161 lb (73 kg)  Height: 5\' 3"  (1.6 m)   Body mass index is 28.52 kg/m. Gen: WD/WN, NAD Head: /AT, No temporalis wasting.  Ear/Nose/Throat: Hearing grossly intact, nares w/o erythema or drainage Eyes: PER, EOMI, sclera nonicteric.  Neck: Supple, no large masses.    Pulmonary:  Good air movement, no audible wheezing bilaterally, no use of accessory muscles.  Cardiac: RRR, no JVD Vascular:  Bilateral carotid bruits Vessel Right Left  Radial Palpable Palpable  Gastrointestinal: Non-distended. No guarding/no peritoneal signs.  Musculoskeletal: M/S 5/5 throughout.  No deformity or atrophy.  Neurologic: CN 2-12 intact. Symmetrical.  Speech is fluent. Motor exam as listed above. Psychiatric: Judgment intact, Mood & affect appropriate for pt's clinical situation. Dermatologic: No rashes or ulcers noted.  No changes consistent with cellulitis. Lymph : No lichenification or skin changes of chronic lymphedema.  CBC Lab Results  Component Value Date   WBC 11.2 (H) 11/25/2019   HGB 9.9 (L) 11/25/2019   HCT 31.7 (L) 11/25/2019   MCV 70 (L) 11/25/2019   PLT 291 11/25/2019    BMET    Component Value Date/Time   NA 140 11/25/2019 1232   K 4.8 11/25/2019 1232   CL 106 11/25/2019 1232   CO2 22 11/25/2019 1232   GLUCOSE 94 11/25/2019 1232   GLUCOSE 100 (H) 01/30/2019 0429   BUN 14 11/25/2019 1232   CREATININE 1.07 (H) 11/25/2019 1232   CREATININE 0.95 09/01/2014 1659   CREATININE 0.80 08/15/2012 1609   CALCIUM 9.2 11/25/2019 1232   CALCIUM 9.7 09/01/2014 1659   GFRNONAA 55 (L) 11/25/2019 1232   GFRNONAA >60 09/01/2014 1659   GFRAA 63 11/25/2019 1232   GFRAA >60 09/01/2014 1659   Estimated Creatinine Clearance: 50.8 mL/min (A) (by C-G formula based on SCr of 1.07 mg/dL (H)).  COAG Lab Results  Component Value Date   INR 1.0 09/01/2014    Radiology No results found.    Assessment/Plan 1. Celiac artery stenosis (HCC) Recommend:  The patient has evidence of chronic asymptomatic mesenteric atherosclerosis.  The patient denies lifestyle limiting changes at this point in time.  Given the lack of symptoms no intervention is warranted at this time.   No further invasive studies, angiography or surgery at this time  The patient should  continue walking and begin a more formal exercise program.   The patient should continue antiplatelet therapy and aggressive treatment of the lipid abnormalities  Patient should undergo noninvasive studies as ordered. The patient will follow up with me after the studies.   - VAS [place] MESENTERIC; Future  2. Bilateral carotid artery stenosis Recommend:  Given the patient's asymptomatic subcritical stenosis no further invasive testing or surgery at this time.  Duplex ultrasound shows <50% stenosis bilaterally.  Continue antiplatelet therapy as prescribed Continue management of CAD, HTN and Hyperlipidemia Healthy heart diet,  encouraged exercise at least 4 times per week Follow up in 72months with duplex ultrasound and physical exam  - VAS US CAROTID; Future  3. Splenic artery aneurysm (HCC) Recommend:  The patient has evidence ofa small splenic artery aneurysm that is 1.20 cm. It is unchanged compared to last year. She denies left upper quadrentabdominal pain and N/V.   Patient should undergoannual surveillance.   The patient will follow up with meannually.  4. Renal artery stenosis (HCC) Given patient's arterial disease optimal control of the patient's hypertension is important.  BP is acceptable today and she has actually decreased her medications  The patient's vital signs and noninvasive studies support the renal artery stenosis is not significantly increased when compared to the previous study.  No invasive studies or intervention is indicated at this time.  The patient will continue the current antihypertensive medications, no changes at this time.  The primary medical service will continue aggressive antihypertensive therapy as per the AHA guidelines   5. PAD (peripheral artery disease) (HCC)  Recommend:  The patient has evidence of atherosclerosis of the lower extremities with claudication.  The patient does not voice lifestyle limiting changes at this  point in time.  Noninvasive studies do not suggest clinically significant change.  No invasive studies, angiography or surgery at this time The patient should continue walking and begin a more formal exercise program.  The patient should continue antiplatelet therapy and aggressive treatment of the lipid abnormalities  No changes in the patient's medications at this time  The patient should continue wearing graduated compression socks 10-15 mmHg strength to control the mild edema.     Levora Dredge, MD  12/10/2019 10:30 AM

## 2019-12-10 NOTE — Telephone Encounter (Signed)
Refill request for norco, last seen 11-25-19, last filled 12-07-19.  Please advise.

## 2019-12-11 ENCOUNTER — Other Ambulatory Visit: Payer: Self-pay | Admitting: Internal Medicine

## 2019-12-18 DIAGNOSIS — M9903 Segmental and somatic dysfunction of lumbar region: Secondary | ICD-10-CM | POA: Diagnosis not present

## 2019-12-18 DIAGNOSIS — M9901 Segmental and somatic dysfunction of cervical region: Secondary | ICD-10-CM | POA: Diagnosis not present

## 2019-12-18 DIAGNOSIS — M9902 Segmental and somatic dysfunction of thoracic region: Secondary | ICD-10-CM | POA: Diagnosis not present

## 2019-12-28 ENCOUNTER — Other Ambulatory Visit: Payer: BC Managed Care – PPO

## 2020-01-01 ENCOUNTER — Other Ambulatory Visit: Payer: Self-pay | Admitting: Internal Medicine

## 2020-01-04 ENCOUNTER — Ambulatory Visit: Payer: BC Managed Care – PPO | Admitting: Internal Medicine

## 2020-01-04 ENCOUNTER — Ambulatory Visit: Payer: BC Managed Care – PPO

## 2020-01-11 ENCOUNTER — Inpatient Hospital Stay: Payer: BC Managed Care – PPO | Attending: Internal Medicine

## 2020-01-11 ENCOUNTER — Other Ambulatory Visit: Payer: Self-pay

## 2020-01-11 DIAGNOSIS — Z79899 Other long term (current) drug therapy: Secondary | ICD-10-CM | POA: Insufficient documentation

## 2020-01-11 DIAGNOSIS — F17210 Nicotine dependence, cigarettes, uncomplicated: Secondary | ICD-10-CM | POA: Insufficient documentation

## 2020-01-11 DIAGNOSIS — Z803 Family history of malignant neoplasm of breast: Secondary | ICD-10-CM | POA: Insufficient documentation

## 2020-01-11 DIAGNOSIS — M129 Arthropathy, unspecified: Secondary | ICD-10-CM | POA: Diagnosis not present

## 2020-01-11 DIAGNOSIS — D563 Thalassemia minor: Secondary | ICD-10-CM | POA: Insufficient documentation

## 2020-01-11 DIAGNOSIS — Z8673 Personal history of transient ischemic attack (TIA), and cerebral infarction without residual deficits: Secondary | ICD-10-CM | POA: Diagnosis not present

## 2020-01-11 DIAGNOSIS — Z7982 Long term (current) use of aspirin: Secondary | ICD-10-CM | POA: Insufficient documentation

## 2020-01-11 DIAGNOSIS — F79 Unspecified intellectual disabilities: Secondary | ICD-10-CM | POA: Diagnosis not present

## 2020-01-11 DIAGNOSIS — N183 Chronic kidney disease, stage 3 unspecified: Secondary | ICD-10-CM | POA: Insufficient documentation

## 2020-01-11 DIAGNOSIS — I447 Left bundle-branch block, unspecified: Secondary | ICD-10-CM | POA: Diagnosis not present

## 2020-01-11 DIAGNOSIS — R5383 Other fatigue: Secondary | ICD-10-CM | POA: Insufficient documentation

## 2020-01-11 DIAGNOSIS — Z87440 Personal history of urinary (tract) infections: Secondary | ICD-10-CM | POA: Insufficient documentation

## 2020-01-11 DIAGNOSIS — E538 Deficiency of other specified B group vitamins: Secondary | ICD-10-CM | POA: Diagnosis not present

## 2020-01-11 DIAGNOSIS — D509 Iron deficiency anemia, unspecified: Secondary | ICD-10-CM | POA: Diagnosis not present

## 2020-01-11 DIAGNOSIS — R918 Other nonspecific abnormal finding of lung field: Secondary | ICD-10-CM | POA: Insufficient documentation

## 2020-01-11 LAB — CBC WITH DIFFERENTIAL/PLATELET
Abs Immature Granulocytes: 0.07 10*3/uL (ref 0.00–0.07)
Basophils Absolute: 0.1 10*3/uL (ref 0.0–0.1)
Basophils Relative: 1 %
Eosinophils Absolute: 0.3 10*3/uL (ref 0.0–0.5)
Eosinophils Relative: 3 %
HCT: 30.5 % — ABNORMAL LOW (ref 36.0–46.0)
Hemoglobin: 9.7 g/dL — ABNORMAL LOW (ref 12.0–15.0)
Immature Granulocytes: 1 %
Lymphocytes Relative: 28 %
Lymphs Abs: 2.7 10*3/uL (ref 0.7–4.0)
MCH: 22 pg — ABNORMAL LOW (ref 26.0–34.0)
MCHC: 31.8 g/dL (ref 30.0–36.0)
MCV: 69.3 fL — ABNORMAL LOW (ref 80.0–100.0)
Monocytes Absolute: 0.8 10*3/uL (ref 0.1–1.0)
Monocytes Relative: 9 %
Neutro Abs: 5.8 10*3/uL (ref 1.7–7.7)
Neutrophils Relative %: 58 %
Platelets: 260 10*3/uL (ref 150–400)
RBC: 4.4 MIL/uL (ref 3.87–5.11)
RDW: 18.6 % — ABNORMAL HIGH (ref 11.5–15.5)
WBC: 9.8 10*3/uL (ref 4.0–10.5)
nRBC: 0.3 % — ABNORMAL HIGH (ref 0.0–0.2)

## 2020-01-11 LAB — IRON AND TIBC
Iron: 85 ug/dL (ref 28–170)
Saturation Ratios: 28 % (ref 10.4–31.8)
TIBC: 302 ug/dL (ref 250–450)
UIBC: 217 ug/dL

## 2020-01-11 LAB — BASIC METABOLIC PANEL
Anion gap: 5 (ref 5–15)
BUN: 12 mg/dL (ref 8–23)
CO2: 24 mmol/L (ref 22–32)
Calcium: 8.6 mg/dL — ABNORMAL LOW (ref 8.9–10.3)
Chloride: 111 mmol/L (ref 98–111)
Creatinine, Ser: 0.98 mg/dL (ref 0.44–1.00)
GFR calc Af Amer: >60 mL/min (ref >60)
GFR calc non Af Amer: >60 mL/min (ref >60)
Glucose, Bld: 106 mg/dL — ABNORMAL HIGH (ref 70–99)
Potassium: 3.9 mmol/L (ref 3.5–5.1)
Sodium: 140 mmol/L (ref 135–145)

## 2020-01-11 LAB — FERRITIN: Ferritin: 155 ng/mL (ref 11–307)

## 2020-01-11 LAB — VITAMIN B12: Vitamin B-12: 474 pg/mL (ref 180–914)

## 2020-01-12 ENCOUNTER — Other Ambulatory Visit: Payer: Self-pay

## 2020-01-12 ENCOUNTER — Telehealth: Payer: Self-pay | Admitting: Internal Medicine

## 2020-01-12 DIAGNOSIS — D509 Iron deficiency anemia, unspecified: Secondary | ICD-10-CM

## 2020-01-12 NOTE — Telephone Encounter (Signed)
Dr. Leonard Schwartz - will you review her labs- the labs were drawn yesterday.

## 2020-01-12 NOTE — Telephone Encounter (Signed)
Left vm for patient. Pt needs to keep her apts as sch. Tomorrow. She needs IV iron per Dr. Donneta Romberg.   mychart msg also sent to patient.

## 2020-01-12 NOTE — Telephone Encounter (Signed)
Pt called requesting her appt for tomorrow be converted to a Mychart visit. I explained to the patient that she has a possible treatment attached, but she stated she never gets it anyway. I told her I had to notify the team of her request and someone would contact her if it was okay to modify the appt. Please contact with decision.

## 2020-01-13 ENCOUNTER — Other Ambulatory Visit: Payer: Self-pay

## 2020-01-13 ENCOUNTER — Inpatient Hospital Stay: Payer: BC Managed Care – PPO

## 2020-01-13 ENCOUNTER — Inpatient Hospital Stay (HOSPITAL_BASED_OUTPATIENT_CLINIC_OR_DEPARTMENT_OTHER): Payer: BC Managed Care – PPO | Admitting: Internal Medicine

## 2020-01-13 VITALS — BP 137/64 | HR 76 | Temp 98.3°F | Wt 161.8 lb

## 2020-01-13 DIAGNOSIS — N183 Chronic kidney disease, stage 3 unspecified: Secondary | ICD-10-CM | POA: Diagnosis not present

## 2020-01-13 DIAGNOSIS — Z79899 Other long term (current) drug therapy: Secondary | ICD-10-CM | POA: Diagnosis not present

## 2020-01-13 DIAGNOSIS — D563 Thalassemia minor: Secondary | ICD-10-CM | POA: Diagnosis not present

## 2020-01-13 DIAGNOSIS — I447 Left bundle-branch block, unspecified: Secondary | ICD-10-CM | POA: Diagnosis not present

## 2020-01-13 DIAGNOSIS — E538 Deficiency of other specified B group vitamins: Secondary | ICD-10-CM | POA: Diagnosis not present

## 2020-01-13 DIAGNOSIS — Z7982 Long term (current) use of aspirin: Secondary | ICD-10-CM | POA: Diagnosis not present

## 2020-01-13 DIAGNOSIS — R918 Other nonspecific abnormal finding of lung field: Secondary | ICD-10-CM | POA: Diagnosis not present

## 2020-01-13 DIAGNOSIS — F79 Unspecified intellectual disabilities: Secondary | ICD-10-CM | POA: Diagnosis not present

## 2020-01-13 DIAGNOSIS — F17210 Nicotine dependence, cigarettes, uncomplicated: Secondary | ICD-10-CM | POA: Diagnosis not present

## 2020-01-13 DIAGNOSIS — D508 Other iron deficiency anemias: Secondary | ICD-10-CM

## 2020-01-13 DIAGNOSIS — Z87440 Personal history of urinary (tract) infections: Secondary | ICD-10-CM | POA: Diagnosis not present

## 2020-01-13 DIAGNOSIS — M129 Arthropathy, unspecified: Secondary | ICD-10-CM | POA: Diagnosis not present

## 2020-01-13 DIAGNOSIS — R5383 Other fatigue: Secondary | ICD-10-CM | POA: Diagnosis not present

## 2020-01-13 DIAGNOSIS — D509 Iron deficiency anemia, unspecified: Secondary | ICD-10-CM | POA: Diagnosis not present

## 2020-01-13 DIAGNOSIS — Z803 Family history of malignant neoplasm of breast: Secondary | ICD-10-CM | POA: Diagnosis not present

## 2020-01-13 DIAGNOSIS — Z8673 Personal history of transient ischemic attack (TIA), and cerebral infarction without residual deficits: Secondary | ICD-10-CM | POA: Diagnosis not present

## 2020-01-13 MED ORDER — IRON SUCROSE 20 MG/ML IV SOLN
200.0000 mg | Freq: Once | INTRAVENOUS | Status: AC
  Administered 2020-01-13: 200 mg via INTRAVENOUS
  Filled 2020-01-13: qty 10

## 2020-01-13 MED ORDER — SODIUM CHLORIDE 0.9 % IV SOLN
Freq: Once | INTRAVENOUS | Status: AC
  Filled 2020-01-13: qty 250

## 2020-01-13 NOTE — Patient Instructions (Signed)
#   take vit D3 1000 IU/day.

## 2020-01-13 NOTE — Progress Notes (Signed)
[facility] CONSULT NOTE  Patient Care Team: Sherlene Shams, MD as PCP - General (Internal Medicine) Lemar Livings Merrily Pew, MD as Consulting Physician (General Surgery)  CHIEF COMPLAINTS/PURPOSE OF CONSULTATION: Anemia.  HEMATOLOGY HISTORY  # CHRONIC MICROCYTIC ANEMIA- BETA THALASSEMIA MINOR [colo- 2016; Dr. Fanny Skates July 2017- IV trial of Venofer x4   # Lung nodules [CT- sub cm Jan 2017]-2019 CT scan stable.  Benign.  HISTORY OF PRESENTING ILLNESS:  Cheryl [name] 64 y.o.  female with a history of beta thalassemia minor; also iron deficiency anemia-is here for follow-up.  Patient continues to complain of ongoing fatigue. Denies any blood in stools or black or stools. No nausea no vomiting no abdominal pain.   Review of Systems  Constitutional: Positive for malaise/fatigue. Negative for chills, diaphoresis, fever and weight loss.  HENT: Negative for nosebleeds and sore throat.   Eyes: Negative for double vision.  Respiratory: Positive for shortness of breath. Negative for cough, hemoptysis, sputum production and wheezing.   Cardiovascular: Negative for chest pain, palpitations, orthopnea and leg swelling.  Gastrointestinal: Negative for abdominal pain, blood in stool, constipation, diarrhea, heartburn, melena, nausea and vomiting.  Genitourinary: Negative for dysuria, frequency and urgency.  Musculoskeletal: Negative for back pain and joint pain.  Skin: Negative.  Negative for itching and rash.  Neurological: Negative for dizziness, tingling, focal weakness, weakness and headaches.  Endo/Heme/Allergies: Does not bruise/bleed easily.  Psychiatric/Behavioral: Negative for depression. The patient is not nervous/anxious and does not have insomnia.      MEDICAL HISTORY:  Past Medical History:  Diagnosis Date  . 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 Aneurysm of splenic artery  (HCC) Jan. 2017  . Arrhythmia    left bundle branch block  . Arthritis   . Blood in stool   . Chicken pox   . Generalized headaches   . History of blood transfusion   . Kidney disease, chronic, stage III (GFR 30-59 ml/min)   . LBBB (left bundle branch block)   . Renal disorder   . Thyroid disease   . UTI (lower urinary tract infection)     SURGICAL HISTORY: Past Surgical History:  Procedure Laterality Date  . ABDOMINAL HYSTERECTOMY  1997  . APPENDECTOMY  1981  . BREAST EXCISIONAL BIOPSY Bilateral 1976   neg  . BREAST SURGERY  1976  . CARDIAC CATHETERIZATION  2004   [facility]  . CARDIAC CATHETERIZATION  2006   [facility]  . cystic fibrosis tumor removal  1983  . THUMB AMPUTATION  1992   traumatic  . TONSILLECTOMY AND ADENOIDECTOMY  1964    SOCIAL HISTORY: Social History   Socioeconomic History  . Marital status: Married    Spouse name: Not on file  . Number of children: Not on file  . Years of education: Not on file  . Highest education level: Not on file  Occupational History  . Not on file  Tobacco Use  . Smoking status: Current Every Day Smoker    Packs/day: 0.25    Years: 34.00    Pack years: 8.50    Types: Cigarettes    Last attempt to quit: 04/10/2013    Years since quitting: 6.7  . Smokeless tobacco: Never Used  Substance and Sexual Activity  . Alcohol use: Yes    Comment: occasional  . Drug use: No  . Sexual activity: Not on file  Other Topics Concern  . Not on file  Social History Narrative   Married to [employer]; Works for [employer], Engineer, structural. Quit smoking 2018; ocassional alcohol; lives near to [place].    Social Determinants of Health   Financial Resource Strain:   . Difficulty of Paying Living Expenses:   Food Insecurity:   . Worried About Programme researcher, broadcasting/film/video in the Last Year:   . Barista in the Last Year:   Transportation Needs:   . Freight forwarder (Medical):   [name] Lack of Transportation (Non-Medical):   Physical  Activity:   . Days of Exercise per Week:   . Minutes of Exercise per Session:   Stress:   . Feeling of Stress :   Social Connections:   . Frequency of Communication with Friends and Family:   . Frequency of Social Gatherings with Friends and Family:   . Attends Religious Services:   . Active Member of Clubs or Organizations:   . Attends Banker Meetings:   [name] Marital Status:   Intimate Partner Violence:   . Fear of Current or Ex-Partner:   . Emotionally Abused:   [name] Physically Abused:   . Sexually Abused:     FAMILY HISTORY: Family History  Problem Relation Age of Onset  . Heart disease Mother   . Arthritis Mother   . Cancer Mother        breast  . Hyperlipidemia Mother   . Hypertension Mother   . Heart attack Mother   . Breast cancer Mother 57  . Heart disease Father   . Diabetes Father   . Arthritis Father   . Hypertension Father   . Parkinson's disease Father   . Hodgkin's lymphoma Father        hodgkins disease, prostate  . Heart disease Sister   . Diabetes Sister   . Heart disease Brother 78       ami x 8,  4 vessel CABG   . Diabetes Brother   . Liver disease Brother   . Lung disease Brother   . Heart disease Maternal Grandmother   . Diabetes Maternal Grandmother   . Heart disease Maternal Grandfather   . Heart disease Paternal Grandmother   . Diabetes Paternal Grandmother   . Stroke Paternal Grandfather   . Heart disease Paternal Grandfather   . Diabetes Paternal Grandfather   . Diabetes Maternal Aunt     ALLERGIES:  is allergic to peanuts [peanut oil], penicillins, sulfa antibiotics, influenza vac split [flu virus vaccine], mobic [meloxicam], and nickel.  MEDICATIONS:  Current Outpatient Medications  Medication Sig Dispense Refill  . albuterol (PROVENTIL HFA;VENTOLIN HFA) 108 (90 Base) MCG/ACT inhaler Inhale 2 puffs into the lungs every 6 (six) hours as needed for wheezing or shortness of breath. 1 Inhaler 0  . aspirin EC 81 MG tablet  Take 1 tablet (81 mg total) by mouth daily.    [name] buPROPion (WELLBUTRIN SR) 100 MG 12 hr tablet TAKE 1 TABLET BY MOUTH TWO  TIMES DAILY (Patient taking differently: Take 100 mg by mouth 2 (two) times daily. ) 180 tablet 3  . Calcium Carbonate-Vit D-Min (CALCIUM 1200 PO) Take 1 tablet by mouth daily.    [name] docusate sodium (COLACE) 100 MG capsule Take 100 mg by mouth daily as needed for mild constipation.    . gabapentin (NEURONTIN) 300 MG capsule TAKE 1 CAPSULE BY MOUTH 3  TIMES DAILY 90 capsule 11  . HYDROcodone-acetaminophen (NORCO/VICODIN) 5-325 MG tablet Take 2 tablets by mouth daily as needed.  60 tablet 0  . HYDROcodone-acetaminophen (NORCO/VICODIN) 5-325 MG tablet Take 2 tablets by mouth daily as needed. 60 tablet 0  . HYDROcodone-acetaminophen (NORCO/VICODIN) 5-325 MG tablet Take 1 tablet by mouth 2 (two) times daily as needed. 60 tablet 0  . hydrOXYzine (ATARAX/VISTARIL) 25 MG tablet TAKE 1 TABLET (25 MG TOTAL) BY MOUTH 3 (THREE) TIMES DAILY AS NEEDED FOR ITCHING. 90 tablet 3  . metoprolol succinate (TOPROL-XL) 25 MG 24 hr tablet Take 1 tablet (25 mg total) by mouth daily. 90 tablet 3  . nitroGLYCERIN (NITROSTAT) 0.4 MG SL tablet Place 1 tablet (0.4 mg total) under the tongue every 5 (five) minutes as needed for chest pain. 25 tablet 3  . progesterone (PROMETRIUM) 200 MG capsule Take 200 mg by mouth at bedtime.     . progesterone (PROMETRIUM) 200 MG capsule Take 200 mg by mouth at bedtime.    . propranolol (INDERAL) 10 MG tablet Take 1 tablet (10 mg total) by mouth 3 (three) times daily as needed. 90 tablet 1  . rOPINIRole (REQUIP) 0.5 MG tablet TAKE 1 TABLET BY MOUTH AT  BEDTIME FOR RESTLESS LEGS 90 tablet 3  . rosuvastatin (CRESTOR) 10 MG tablet TAKE 1 TABLET BY MOUTH  DAILY 90 tablet 3  . tiZANidine (ZANAFLEX) 2 MG tablet Take 1 tablet (2 mg total) by mouth every 6 (six) hours as needed for muscle spasms. 30 tablet 0  . traMADol (ULTRAM) 50 MG tablet TAKE 1 TABLET BY MOUTH  TWICE DAILY AS  NEEDED FOR  PAIN 60 tablet 5  . venlafaxine XR (EFFEXOR-XR) 75 MG 24 hr capsule TAKE 1 CAPSULE BY MOUTH  DAILY WITH BREAKFAST 90 capsule 3  . vitamin C (ASCORBIC ACID) 500 MG tablet Take 500 mg by mouth daily.    [name] losartan (COZAAR) 50 MG tablet Take 50 mg by mouth daily. (Patient not taking: Reported on 12/10/2019)    . predniSONE (DELTASONE) 50 MG tablet Take 1 tablet (50 mg total) by mouth daily. (Patient not taking: Reported on 01/13/2020) 5 tablet 0   No current facility-administered medications for this visit.      [name]  PHYSICAL EXAMINATION:   Vitals:   01/13/20 1314  BP: 137/64  Pulse: 76  Temp: 98.3 F (36.8 C)  SpO2: 99%   Filed Weights   01/13/20 1314  Weight: 161 lb 12.8 oz (73.4 kg)   Physical Exam HENT:     Head: Normocephalic and atraumatic.     Mouth/Throat:     Pharynx: No oropharyngeal exudate.  Eyes:     Pupils: Pupils are equal, round, and reactive to light.  Cardiovascular:     Rate and Rhythm: Normal rate and regular rhythm.  Pulmonary:     Effort: Pulmonary effort is normal. No respiratory distress.     Breath sounds: Normal breath sounds. No wheezing.  Abdominal:     General: Bowel sounds are normal. There is no distension.     Palpations: Abdomen is soft. There is no mass.     Tenderness: There is no abdominal tenderness. There is no guarding or rebound.  Musculoskeletal:        General: No tenderness. Normal range of motion.     Cervical back: Normal range of motion and neck supple.  Skin:    General: Skin is warm.  Neurological:     Mental Status: She is alert and oriented to person, place, and time.  Psychiatric:        Mood and Affect: Affect normal.  LABORATORY DATA:  I have reviewed the data as listed Lab Results  Component Value Date   WBC 9.8 01/11/2020   HGB 9.7 (L) 01/11/2020   HCT 30.5 (L) 01/11/2020   MCV 69.3 (L) 01/11/2020   PLT 260 01/11/2020   Recent Labs    10/23/19 1011 11/25/19 1232 01/11/20 1407  NA 139  140 140  K 4.6 4.8 3.9  CL 106 106 111  CO2 20 22 24   GLUCOSE 94 94 106*  BUN 12 14 12   CREATININE 1.04* 1.07* 0.98  CALCIUM 9.2 9.2 8.6*  GFRNONAA 57* 55* >60  GFRAA 66 63 >60  PROT 6.3 6.2  --   ALBUMIN 4.4 4.5  --   AST 12 15  --   ALT 9 12  --   ALKPHOS 76 92  --   BILITOT 0.6 0.5  --      No results found.  ASSESSMENT & PLAN:   Thalassemia minor # Chronic microcytic anemia/history of beta thalassemia - out of proportion to her anemia. S/p IV venoferx4 in fall of 2017.  #Today hemoglobin is 9.7; saturation 28% ferritin 155 [slightly lower compared to previous]-as patient is symptomatic proceed with Venofer today.  # B12 deficiency - on PO B12.   # Low calcium- 8.5; recommend vit D 1000 units a day.   # DISPOSITION: # Venofer today # follow up 6 months-MD-labs-cbc/bmp/iron studies/ferritin/B12- 1 week prior; venofer IV possible.   Cc; Dr.Tullo.    Earna CoderGovinda R Courage Biglow, MD 01/13/2020 1:46 PM

## 2020-01-13 NOTE — Assessment & Plan Note (Addendum)
#   Chronic microcytic anemia/history of beta thalassemia - out of proportion to her anemia. S/p IV venoferx4 in fall of 2017.  #Today hemoglobin is 9.7; saturation 28% ferritin 155 [slightly lower compared to previous]-as patient is symptomatic proceed with Venofer today.  # B12 deficiency - on PO B12.   # Low calcium- 8.5; recommend vit D 1000 units a day.   # DISPOSITION: # Venofer today # follow up 6 months-MD-labs-cbc/bmp/iron studies/ferritin/B12- 1 week prior; venofer IV possible.   Cc; Dr.Tullo.

## 2020-01-19 ENCOUNTER — Other Ambulatory Visit: Payer: Self-pay | Admitting: Internal Medicine

## 2020-02-01 ENCOUNTER — Other Ambulatory Visit: Payer: Self-pay | Admitting: Cardiovascular Disease

## 2020-02-01 ENCOUNTER — Other Ambulatory Visit: Payer: Self-pay | Admitting: Family Medicine

## 2020-02-02 NOTE — Telephone Encounter (Signed)
Please schedule 12 month F/U appointment. Thank you! 

## 2020-02-04 ENCOUNTER — Ambulatory Visit: Payer: BC Managed Care – PPO | Admitting: Family

## 2020-02-10 ENCOUNTER — Ambulatory Visit: Payer: BC Managed Care – PPO | Admitting: Family

## 2020-02-10 ENCOUNTER — Other Ambulatory Visit: Payer: Self-pay

## 2020-02-10 ENCOUNTER — Telehealth (INDEPENDENT_AMBULATORY_CARE_PROVIDER_SITE_OTHER): Payer: BC Managed Care – PPO | Admitting: Internal Medicine

## 2020-02-10 ENCOUNTER — Encounter: Payer: Self-pay | Admitting: Internal Medicine

## 2020-02-10 DIAGNOSIS — M19012 Primary osteoarthritis, left shoulder: Secondary | ICD-10-CM

## 2020-02-10 DIAGNOSIS — D563 Thalassemia minor: Secondary | ICD-10-CM | POA: Diagnosis not present

## 2020-02-10 DIAGNOSIS — M19011 Primary osteoarthritis, right shoulder: Secondary | ICD-10-CM | POA: Diagnosis not present

## 2020-02-10 DIAGNOSIS — M545 Low back pain, unspecified: Secondary | ICD-10-CM

## 2020-02-10 DIAGNOSIS — G8929 Other chronic pain: Secondary | ICD-10-CM

## 2020-02-10 MED ORDER — HYDROCODONE-ACETAMINOPHEN 5-325 MG PO TABS: 1.0000 | ORAL_TABLET | Freq: Two times a day (BID) | ORAL | 0 refills | Status: DC | PRN

## 2020-02-10 MED ORDER — PREDNISONE 10 MG PO TABS: ORAL_TABLET | ORAL | 0 refills | Status: DC

## 2020-02-10 MED ORDER — ROPINIROLE HCL 0.5 MG PO TABS
0.5000 mg | ORAL_TABLET | Freq: Two times a day (BID) | ORAL | 3 refills | Status: DC
Start: 2020-02-10 — End: 2020-03-10

## 2020-02-10 MED ORDER — HYDROCODONE-ACETAMINOPHEN 5-325 MG PO TABS: 2.0000 | ORAL_TABLET | Freq: Every day | ORAL | 0 refills | Status: DC | PRN

## 2020-02-10 NOTE — Assessment & Plan Note (Addendum)
With current acute exacerbation that has not responded to chiropractic maniopulation   MRI of lumbar spine done in 2018 did not show spinal stenosis,   But her pain was not controlled with Celebrex and it raised her blood pressure.  Continue use of vicodin TWO TIMES DAILY . Suspended  celebrex and continue tramadol for  daytime use.   Refill history confirmed via [place] Controlled Substance databas, accessed by me today.  Refills  GIVEN FOR September, October and November .  Marland KitchenBack extension exercises demonstrated and prednisone taper refilled

## 2020-02-10 NOTE — Assessment & Plan Note (Signed)
She received an iron infusion last month by hematology

## 2020-02-10 NOTE — Patient Instructions (Signed)
I have refilled the prednisone taper and your Vicodin for 3 months .     Exercise: (supported back extension, standing)  Stand against a counter or sofa with your buttocks resting on the edge and your hands on the edge as well on either side of your back  Slowly lean backwards,  Bending from the waist, until you feel slight discomfort. Restore yourself to vertical position (up straight) Repeat the back extension 10 times , each time extending a little farther).  What should you expect? The pain should recede from the calf/thigh/buttocks but may localize to the lower back  If it does not,  Or if it makes the leg pain worse, STOP doing it.   If it results in improvement,  Repeat the exercise every 2-3 hours while awake and STOP THE OTHER EXERCISES that do the opposite motion (back flexion)

## 2020-02-10 NOTE — Progress Notes (Signed)
Virtual Visit via [facility]  This visit type was conducted due to national recommendations for restrictions regarding the COVID-19 pandemic (e.g. social distancing).  This format is felt to be most appropriate for this patient at this time.  All issues noted in this document were discussed and addressed.  No physical exam was performed (except for noted visual exam findings with Video Visits).   I connected with@ on 02/10/20 at  3:30 PM EDT by a video enabled telemedicine application or telephone and verified that I am speaking with the correct person using two identifiers. Location patient: home Location provider: work or home office Persons participating in the virtual visit: patient, provider  I discussed the limitations, risks, security and privacy concerns of performing an evaluation and management service by telephone and the availability of in person appointments. I also discussed with the patient that there may be a patient responsible charge related to this service. The patient expressed understanding and agreed to proceed.  Reason for visit: medication refill   HPI:  64 yr old female with chronic  Low back pain managed with  opioids due to CKD , thalassemia,  Iron deficiency,  RLS and hypertension presents for follow up  1) has been having left sided buttock pain , did not respond to chiropractic manipulation.  Has been constant   Exercises reviewed:  All involve extreme flexion .  None with extension of back .  2) received iron transfusion last month..  Still tired   3) Hypertension:  Hypotension resolved with dc losartan .  Taking toprol xl 12.5 mg bid for management of palpitations with supine position.  Discussed taking 25 mg at dinnertime . Home readings of BP have been 110 to 125 systolic / 70-80 diastolic pulse 95 , always higher in the afternoons and evenings.   4) RLS  Improved symptoms post iron transfusion and using requip bid       ROS: See pertinent positives and  negatives per HPI.  Past Medical History:  Diagnosis Date  . 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 Arrhythmia    left bundle branch block  . Arthritis   . Blood in stool   . Chicken pox   . Generalized headaches   . History of blood transfusion   . Kidney disease, chronic, stage III (GFR 30-59 ml/min)   . LBBB (left bundle branch block)   . Renal disorder   . Thyroid disease   . UTI (lower urinary tract infection)     Past Surgical History:  Procedure Laterality Date  . ABDOMINAL HYSTERECTOMY  1997  . APPENDECTOMY  1981  . BREAST EXCISIONAL BIOPSY Bilateral 1976   neg  . BREAST SURGERY  1976  . CARDIAC CATHETERIZATION  2004   [facility]  . CARDIAC CATHETERIZATION  2006   [facility]  . cystic fibrosis tumor removal  1983  . THUMB AMPUTATION  1992   traumatic  . TONSILLECTOMY AND ADENOIDECTOMY  1964    Family History  Problem Relation Age of Onset  . Heart disease Mother   . Arthritis Mother   . Cancer Mother        breast  . Hyperlipidemia Mother   . Hypertension Mother   . Heart attack Mother   . Breast cancer Mother 36  . Heart disease Father   . Diabetes Father   . Arthritis Father   . Hypertension  Father   . Parkinson's disease Father   . Hodgkin's lymphoma Father        hodgkins disease, prostate  . Heart disease Sister   . Diabetes Sister   . Heart disease Brother 84       ami x 8,  4 vessel CABG   . Diabetes Brother   . Liver disease Brother   . Lung disease Brother   . Heart disease Maternal Grandmother   . Diabetes Maternal Grandmother   . Heart disease Maternal Grandfather   . Heart disease Paternal Grandmother   . Diabetes Paternal Grandmother   . Stroke Paternal Grandfather   . Heart disease Paternal Grandfather   . Diabetes Paternal Grandfather   . Diabetes Maternal Aunt     SOCIAL HX:  reports that she has been  smoking cigarettes. She has a 8.50 pack-year smoking history. She has never used smokeless tobacco. She reports current alcohol use. She reports that she does not use drugs.   Current Outpatient Medications:  .  albuterol (PROVENTIL HFA;VENTOLIN HFA) 108 (90 Base) MCG/ACT inhaler, Inhale 2 puffs into the lungs every 6 (six) hours as needed for wheezing or shortness of breath., Disp: 1 Inhaler, Rfl: 0 .  aspirin EC 81 MG tablet, Take 1 tablet (81 mg total) by mouth daily., Disp: , Rfl:  .  buPROPion (WELLBUTRIN SR) 100 MG 12 hr tablet, TAKE 1 TABLET BY MOUTH  TWICE DAILY, Disp: 180 tablet, Rfl: 3 .  Calcium Carbonate-Vit D-Min (CALCIUM 1200 PO), Take 1 tablet by mouth daily., Disp: , Rfl:  .  docusate sodium (COLACE) 100 MG capsule, Take 100 mg by mouth daily as needed for mild constipation., Disp: , Rfl:  .  gabapentin (NEURONTIN) 300 MG capsule, TAKE 1 CAPSULE BY MOUTH 3  TIMES DAILY, Disp: 270 capsule, Rfl: 3 .  [START ON 04/11/2020] HYDROcodone-acetaminophen (NORCO/VICODIN) 5-325 MG tablet, Take 2 tablets by mouth daily as needed., Disp: 60 tablet, Rfl: 0 .  [START ON 03/12/2020] HYDROcodone-acetaminophen (NORCO/VICODIN) 5-325 MG tablet, Take 2 tablets by mouth daily as needed., Disp: 60 tablet, Rfl: 0 .  [START ON 02/11/2020] HYDROcodone-acetaminophen (NORCO/VICODIN) 5-325 MG tablet, Take 1 tablet by mouth 2 (two) times daily as needed., Disp: 60 tablet, Rfl: 0 .  hydrOXYzine (ATARAX/VISTARIL) 25 MG tablet, TAKE 1 TABLET (25 MG TOTAL) BY MOUTH 3 (THREE) TIMES DAILY AS NEEDED FOR ITCHING., Disp: 90 tablet, Rfl: 3 .  metoprolol succinate (TOPROL-XL) 25 MG 24 hr tablet, Take 1 tablet (25 mg total) by mouth daily., Disp: 90 tablet, Rfl: 3 .  nitroGLYCERIN (NITROSTAT) 0.4 MG SL tablet, DISSOLVE 1 TABLET UNDER THE TONGUE EVERY 5 MINUTES AS  NEEDED FOR CHEST PAIN. MAX  OF 3 TABLETS IN 15 MINUTES. CALL 911 IF PAIN PERSISTS., Disp: 100 tablet, Rfl: 3 .  progesterone (PROMETRIUM) 200 MG capsule, Take 200 mg  by mouth at bedtime. , Disp: , Rfl:  .  progesterone (PROMETRIUM) 200 MG capsule, Take 200 mg by mouth at bedtime., Disp: , Rfl:  .  propranolol (INDERAL) 10 MG tablet, TAKE 1 TABLET BY MOUTH 3  TIMES DAILY AS NEEDED, Disp: 180 tablet, Rfl: 5 .  rOPINIRole (REQUIP) 0.5 MG tablet, Take 1 tablet (0.5 mg total) by mouth in the morning and at bedtime., Disp: 180 tablet, Rfl: 3 .  rosuvastatin (CRESTOR) 10 MG tablet, TAKE 1 TABLET BY MOUTH  DAILY, Disp: 90 tablet, Rfl: 3 .  tiZANidine (ZANAFLEX) 2 MG tablet, Take 1 tablet (2 mg total) by mouth  every 6 (six) hours as needed for muscle spasms., Disp: 30 tablet, Rfl: 0 .  traMADol (ULTRAM) 50 MG tablet, TAKE 1 TABLET BY MOUTH  TWICE DAILY AS NEEDED FOR  PAIN, Disp: 60 tablet, Rfl: 5 .  venlafaxine XR (EFFEXOR-XR) 75 MG 24 hr capsule, TAKE 1 CAPSULE BY MOUTH  DAILY WITH BREAKFAST, Disp: 90 capsule, Rfl: 3 .  vitamin C (ASCORBIC ACID) 500 MG tablet, Take 500 mg by mouth daily., Disp: , Rfl:  .  predniSONE (DELTASONE) 10 MG tablet, 6 tablets on Day 1 , then reduce by 1 tablet daily until gone, Disp: 21 tablet, Rfl: 0  EXAM:  VITALS per patient if applicable:  GENERAL: alert, oriented, appears well and in no acute distress  HEENT: atraumatic, conjunttiva clear, no obvious abnormalities on inspection of external nose and ears  NECK: normal movements of the head and neck  LUNGS: on inspection no signs of respiratory distress, breathing rate appears normal, no obvious gross SOB, gasping or wheezing  CV: no obvious cyanosis  MS: moves all visible extremities without noticeable abnormality  PSYCH/NEURO: pleasant and cooperative, no obvious depression or anxiety, speech and thought processing grossly intact  ASSESSMENT AND PLAN:  Discussed the following assessment and plan:  Osteoarthritis of both shoulders - Plan: HYDROcodone-acetaminophen (NORCO/VICODIN) 5-325 MG tablet, HYDROcodone-acetaminophen (NORCO/VICODIN) 5-325 MG tablet,  HYDROcodone-acetaminophen (NORCO/VICODIN) 5-325 MG tablet  Thalassemia minor  Chronic low back pain without sciatica, unspecified back pain laterality  Thalassemia minor She received an iron infusion last month by hematology   Chronic lower back pain With current acute exacerbation that has not responded to chiropractic maniopulation   MRI of lumbar spine done in 2018 did not show spinal stenosis,   But her pain was not controlled with Celebrex and it raised her blood pressure.  Continue use of vicodin TWO TIMES DAILY . Suspended  celebrex and continue tramadol for  daytime use.   Refill history confirmed via [place] Controlled Substance databas, accessed by me today.  Refills  GIVEN FOR September, October and November .  Marland KitchenBack extension exercises demonstrated and prednisone taper refilled      I discussed the assessment and treatment plan with the patient. The patient was provided an opportunity to ask questions and all were answered. The patient agreed with the plan and demonstrated an understanding of the instructions.   The patient was advised to call back or seek an in-person evaluation if the symptoms worsen or if the condition fails to improve as anticipated.  I provided  30 minutes of face-to-face time during this encounter.   Sherlene Shams, MD

## 2020-02-12 DIAGNOSIS — N951 Menopausal and female climacteric states: Secondary | ICD-10-CM | POA: Diagnosis not present

## 2020-02-12 DIAGNOSIS — R232 Flushing: Secondary | ICD-10-CM | POA: Diagnosis not present

## 2020-02-19 DIAGNOSIS — Z6828 Body mass index (BMI) 28.0-28.9, adult: Secondary | ICD-10-CM | POA: Diagnosis not present

## 2020-02-19 DIAGNOSIS — M2550 Pain in unspecified joint: Secondary | ICD-10-CM | POA: Diagnosis not present

## 2020-02-19 DIAGNOSIS — R232 Flushing: Secondary | ICD-10-CM | POA: Diagnosis not present

## 2020-02-19 DIAGNOSIS — N951 Menopausal and female climacteric states: Secondary | ICD-10-CM | POA: Diagnosis not present

## 2020-02-21 ENCOUNTER — Other Ambulatory Visit: Payer: Self-pay | Admitting: Internal Medicine

## 2020-03-10 ENCOUNTER — Other Ambulatory Visit: Payer: Self-pay | Admitting: Internal Medicine

## 2020-03-14 ENCOUNTER — Telehealth: Payer: Self-pay

## 2020-03-14 ENCOUNTER — Other Ambulatory Visit: Payer: Self-pay | Admitting: Internal Medicine

## 2020-03-14 DIAGNOSIS — M19011 Primary osteoarthritis, right shoulder: Secondary | ICD-10-CM

## 2020-03-14 DIAGNOSIS — M19012 Primary osteoarthritis, left shoulder: Secondary | ICD-10-CM

## 2020-03-14 MED ORDER — HYDROCODONE-ACETAMINOPHEN 5-325 MG PO TABS: 2.0000 | ORAL_TABLET | Freq: Every day | ORAL | 0 refills | Status: DC | PRN

## 2020-03-14 NOTE — Telephone Encounter (Signed)
Hydrocodone sent in again.  Dr [name] referred her to Dr Shirlyn Goltz per last visit,  But I see no record of a visit with him.  So that's what I recommend she do

## 2020-03-14 NOTE — Telephone Encounter (Signed)
Looks like the last rx's that were sent in did not go electronically for the Hydrocodone.

## 2020-03-14 NOTE — Telephone Encounter (Signed)
Pt states that the pharmacy is stating that they do not have refill for HYDROcodone-acetaminophen (NORCO/VICODIN) 5-325 MG tablet. She also states that her nerve pain in her back is not any better. Please advise

## 2020-03-15 ENCOUNTER — Telehealth: Payer: Self-pay | Admitting: Family Medicine

## 2020-03-15 ENCOUNTER — Other Ambulatory Visit: Payer: Self-pay

## 2020-03-15 DIAGNOSIS — M5412 Radiculopathy, cervical region: Secondary | ICD-10-CM

## 2020-03-15 NOTE — Telephone Encounter (Signed)
Spoke with pt and she stated that she was unaware of the referral that was placed. Pt was given the number to call over to Dr. Margaretann Loveless office to see about scheduling an appt.

## 2020-03-15 NOTE — Telephone Encounter (Signed)
Per patient, she called Dr. Melina Schools office today about her nerve pain. Dr. Darrick Huntsman informed patient that per Dr. Michaelle Copas last OV, we were to refer pt to Dr. Yves Dill at [place]. I do not see this referral but patient is interested in going.

## 2020-03-17 NOTE — Telephone Encounter (Signed)
Left VM for pt advising referral sent to [facility] @ [place].

## 2020-03-18 ENCOUNTER — Other Ambulatory Visit (HOSPITAL_COMMUNITY): Payer: Self-pay | Admitting: Physical Medicine & Rehabilitation

## 2020-03-18 ENCOUNTER — Other Ambulatory Visit: Payer: Self-pay | Admitting: Physical Medicine & Rehabilitation

## 2020-03-18 DIAGNOSIS — M5442 Lumbago with sciatica, left side: Secondary | ICD-10-CM | POA: Diagnosis not present

## 2020-03-18 DIAGNOSIS — M5416 Radiculopathy, lumbar region: Secondary | ICD-10-CM

## 2020-03-23 ENCOUNTER — Other Ambulatory Visit: Payer: Self-pay

## 2020-03-23 ENCOUNTER — Ambulatory Visit
Admission: RE | Admit: 2020-03-23 | Discharge: 2020-03-23 | Disposition: A | Discharge status: Home / Self Care | Payer: BC Managed Care – PPO | Source: Ambulatory Visit | Attending: Physical Medicine & Rehabilitation | Admitting: Physical Medicine & Rehabilitation

## 2020-03-23 DIAGNOSIS — M5416 Radiculopathy, lumbar region: Secondary | ICD-10-CM | POA: Insufficient documentation

## 2020-03-23 DIAGNOSIS — M545 Low back pain, unspecified: Secondary | ICD-10-CM | POA: Diagnosis not present

## 2020-03-24 DIAGNOSIS — M5442 Lumbago with sciatica, left side: Secondary | ICD-10-CM | POA: Diagnosis not present

## 2020-03-24 DIAGNOSIS — M48061 Spinal stenosis, lumbar region without neurogenic claudication: Secondary | ICD-10-CM | POA: Insufficient documentation

## 2020-03-28 ENCOUNTER — Ambulatory Visit
Admission: RE | Admit: 2020-03-28 | Discharge: 2020-03-28 | Disposition: A | Discharge status: Home / Self Care | Payer: BC Managed Care – PPO | Source: Ambulatory Visit | Attending: Internal Medicine | Admitting: Internal Medicine

## 2020-03-28 ENCOUNTER — Other Ambulatory Visit: Payer: Self-pay

## 2020-03-28 DIAGNOSIS — Z1231 Encounter for screening mammogram for malignant neoplasm of breast: Secondary | ICD-10-CM | POA: Diagnosis not present

## 2020-03-31 DIAGNOSIS — M48061 Spinal stenosis, lumbar region without neurogenic claudication: Secondary | ICD-10-CM | POA: Diagnosis not present

## 2020-03-31 DIAGNOSIS — M5442 Lumbago with sciatica, left side: Secondary | ICD-10-CM | POA: Diagnosis not present

## 2020-04-14 ENCOUNTER — Telehealth: Payer: Self-pay | Admitting: Internal Medicine

## 2020-04-14 DIAGNOSIS — M19011 Primary osteoarthritis, right shoulder: Secondary | ICD-10-CM

## 2020-04-14 DIAGNOSIS — M19012 Primary osteoarthritis, left shoulder: Secondary | ICD-10-CM

## 2020-04-14 MED ORDER — HYDROCODONE-ACETAMINOPHEN 5-325 MG PO TABS: 1.0000 | ORAL_TABLET | Freq: Two times a day (BID) | ORAL | 0 refills | Status: DC | PRN

## 2020-04-14 MED ORDER — HYDROCODONE-ACETAMINOPHEN 5-325 MG PO TABS: 2.0000 | ORAL_TABLET | Freq: Every day | ORAL | 0 refills | Status: DC | PRN

## 2020-04-14 NOTE — Addendum Note (Signed)
Addended by: Sherlene Shams on: 04/14/2020 11:36 AM   Modules accepted: Orders

## 2020-04-14 NOTE — Telephone Encounter (Signed)
They went to mail order.  Resent to [facility]

## 2020-04-14 NOTE — Telephone Encounter (Signed)
Pt called and said that [facility] told her that they don't have an rx for  HYDROcodone-acetaminophen (NORCO/VICODIN) 5-325 MG tablet for this mouth

## 2020-04-25 DIAGNOSIS — M48061 Spinal stenosis, lumbar region without neurogenic claudication: Secondary | ICD-10-CM | POA: Diagnosis not present

## 2020-04-25 DIAGNOSIS — M5442 Lumbago with sciatica, left side: Secondary | ICD-10-CM | POA: Diagnosis not present

## 2020-05-09 DIAGNOSIS — N951 Menopausal and female climacteric states: Secondary | ICD-10-CM | POA: Diagnosis not present

## 2020-05-09 DIAGNOSIS — M5442 Lumbago with sciatica, left side: Secondary | ICD-10-CM | POA: Diagnosis not present

## 2020-05-09 DIAGNOSIS — M48061 Spinal stenosis, lumbar region without neurogenic claudication: Secondary | ICD-10-CM | POA: Diagnosis not present

## 2020-05-12 DIAGNOSIS — M5442 Lumbago with sciatica, left side: Secondary | ICD-10-CM | POA: Diagnosis not present

## 2020-05-12 DIAGNOSIS — M48061 Spinal stenosis, lumbar region without neurogenic claudication: Secondary | ICD-10-CM | POA: Diagnosis not present

## 2020-05-13 DIAGNOSIS — R5383 Other fatigue: Secondary | ICD-10-CM | POA: Diagnosis not present

## 2020-05-13 DIAGNOSIS — R232 Flushing: Secondary | ICD-10-CM | POA: Diagnosis not present

## 2020-05-13 DIAGNOSIS — N951 Menopausal and female climacteric states: Secondary | ICD-10-CM | POA: Diagnosis not present

## 2020-05-17 ENCOUNTER — Other Ambulatory Visit: Payer: Self-pay | Admitting: Internal Medicine

## 2020-05-19 DIAGNOSIS — M79605 Pain in left leg: Secondary | ICD-10-CM | POA: Diagnosis not present

## 2020-05-23 ENCOUNTER — Other Ambulatory Visit: Payer: Self-pay

## 2020-05-23 ENCOUNTER — Encounter: Payer: Self-pay | Admitting: Internal Medicine

## 2020-05-23 ENCOUNTER — Ambulatory Visit: Payer: BC Managed Care – PPO | Admitting: Internal Medicine

## 2020-05-23 VITALS — BP 138/64 | HR 84 | Temp 98.1°F | Resp 14 | Ht 63.0 in | Wt 163.8 lb

## 2020-05-23 DIAGNOSIS — R2689 Other abnormalities of gait and mobility: Secondary | ICD-10-CM

## 2020-05-23 DIAGNOSIS — D561 Beta thalassemia: Secondary | ICD-10-CM

## 2020-05-23 DIAGNOSIS — Z79899 Other long term (current) drug therapy: Secondary | ICD-10-CM

## 2020-05-23 DIAGNOSIS — M545 Low back pain, unspecified: Secondary | ICD-10-CM

## 2020-05-23 DIAGNOSIS — R29898 Other symptoms and signs involving the musculoskeletal system: Secondary | ICD-10-CM

## 2020-05-23 DIAGNOSIS — G8929 Other chronic pain: Secondary | ICD-10-CM | POA: Diagnosis not present

## 2020-05-23 NOTE — Patient Instructions (Signed)
Ordering physical therapy for the leg weakness/balance issues  If no weakness is identified.  Will recommend neurology re evaluation

## 2020-05-23 NOTE — Progress Notes (Signed)
Subjective:  Patient ID: Cheryl [name], female    DOB: May 17, 1956  Age: 64 y.o. MRN: [id number]  CC: The primary encounter diagnosis was Chronic low back pain without sciatica, unspecified back pain laterality. Diagnoses of Balance problem, Long-term use of high-risk medication, Beta thalassemia (HCC), and Proximal leg weakness were also pertinent to this visit.  HPI Cheryl [name] presents for follow up on low back pain and poor balance,  MEDICATION REFILL  Chronic low back pain with radiation to left buttock.  Seen by NeurOsurgery of initial consultation Dec 3.  MRI Oct 2021 reviewed:  S1 nerve root contact with disk but surgeon disagreed,  'NO SURGICAL INDICATION" CURRENTLY DUE to  NORMAL DTS AND STRENGTH.  HE ORDERED EMG/[facility]  STUDIES .    Anniversary grief:  mother died Jun 06, 2023 10 years ago,  Tough time for her. enjoys her grandchildren .  No conflict with daughter who is vaccinated despite her refusal to be  Be vaccinated    Husband was seen last week and raised concern about patient having symptoms of Parkinson's (loss of balance) .  Had a fall at home while walking outside,  Did not trip on a root.  Left leg starts to give out when climbing stairs.  Says she is Doing exercises at home , works from home.   Outpatient Medications Prior to Visit  Medication Sig Dispense Refill  . albuterol (PROVENTIL HFA;VENTOLIN HFA) 108 (90 Base) MCG/ACT inhaler Inhale 2 puffs into the lungs every 6 (six) hours as needed for wheezing or shortness of breath. 1 Inhaler 0  . aspirin EC 81 MG tablet Take 1 tablet (81 mg total) by mouth daily.    [name] buPROPion (WELLBUTRIN SR) 100 MG 12 hr tablet TAKE 1 TABLET BY MOUTH  TWICE DAILY 180 tablet 3  . Calcium Carbonate-Vit D-Min (CALCIUM 1200 PO) Take 1 tablet by mouth daily.    [name] docusate sodium (COLACE) 100 MG capsule Take 100 mg by mouth daily as needed for mild constipation.    . gabapentin (NEURONTIN) 300 MG capsule TAKE 1 CAPSULE BY MOUTH 3  TIMES  DAILY 270 capsule 3  . HYDROcodone-acetaminophen (NORCO/VICODIN) 5-325 MG tablet Take 2 tablets by mouth daily as needed. 60 tablet 0  . HYDROcodone-acetaminophen (NORCO/VICODIN) 5-325 MG tablet Take 1 tablet by mouth 2 (two) times daily as needed. 60 tablet 0  . HYDROcodone-acetaminophen (NORCO/VICODIN) 5-325 MG tablet Take 2 tablets by mouth daily as needed. 60 tablet 0  . hydrOXYzine (ATARAX/VISTARIL) 25 MG tablet TAKE 1 TABLET (25 MG TOTAL) BY MOUTH 3 (THREE) TIMES DAILY AS NEEDED FOR ITCHING. 90 tablet 3  . metoprolol succinate (TOPROL-XL) 25 MG 24 hr tablet TAKE 1 TABLET BY MOUTH  DAILY 90 tablet 3  . nitroGLYCERIN (NITROSTAT) 0.4 MG SL tablet DISSOLVE 1 TABLET UNDER THE TONGUE EVERY 5 MINUTES AS  NEEDED FOR CHEST PAIN. MAX  OF 3 TABLETS IN 15 MINUTES. CALL 911 IF PAIN PERSISTS. 100 tablet 3  . progesterone (PROMETRIUM) 200 MG capsule Take 200 mg by mouth at bedtime.     . progesterone (PROMETRIUM) 200 MG capsule Take 200 mg by mouth at bedtime.    . propranolol (INDERAL) 10 MG tablet TAKE 1 TABLET BY MOUTH 3  TIMES DAILY AS NEEDED 180 tablet 5  . rOPINIRole (REQUIP) 0.5 MG tablet TAKE 1 TABLET(0.5 MG) BY MOUTH THREE TIMES DAILY 90 tablet 1  . rosuvastatin (CRESTOR) 10 MG tablet TAKE 1 TABLET BY MOUTH  DAILY 90 tablet 3  .  tiZANidine (ZANAFLEX) 2 MG tablet Take 1 tablet (2 mg total) by mouth every 6 (six) hours as needed for muscle spasms. 30 tablet 0  . traMADol (ULTRAM) 50 MG tablet TAKE 1 TABLET BY MOUTH  TWICE DAILY AS NEEDED FOR  PAIN 60 tablet 5  . venlafaxine XR (EFFEXOR-XR) 75 MG 24 hr capsule TAKE 1 CAPSULE(75 MG) BY MOUTH DAILY WITH BREAKFAST 90 capsule 3  . vitamin C (ASCORBIC ACID) 500 MG tablet Take 500 mg by mouth daily.    . predniSONE (DELTASONE) 10 MG tablet 6 tablets on Day 1 , then reduce by 1 tablet daily until gone 21 tablet 0   No facility-administered medications prior to visit.    Review of Systems;  Patient denies headache, fevers, malaise, unintentional weight  loss, skin rash, eye pain, sinus congestion and sinus pain, sore throat, dysphagia,  hemoptysis , cough, dyspnea, wheezing, chest pain, palpitations, orthopnea, edema, abdominal pain, nausea, melena, diarrhea, constipation, flank pain, dysuria, hematuria, urinary  Frequency, nocturia, numbness, tingling, seizures,  Focal weakness, Loss of consciousness,  Tremor, insomnia, depression, anxiety, and suicidal ideation.      Objective:  BP 138/64 (BP Location: Left Arm, Patient Position: Sitting, Cuff Size: Normal)   Pulse 84   Temp 98.1 F (36.7 C) (Oral)   Resp 14   Ht 5\' 3"  (1.6 m)   Wt 163 lb 12.8 oz (74.3 kg)   SpO2 99%   BMI 29.02 kg/m   BP Readings from Last 3 Encounters:  05/23/20 138/64  02/10/20 127/72  01/13/20 137/64    Wt Readings from Last 3 Encounters:  05/23/20 163 lb 12.8 oz (74.3 kg)  02/10/20 160 lb (72.6 kg)  01/13/20 161 lb 12.8 oz (73.4 kg)    General appearance: alert, cooperative and appears stated age Ears: normal TM's and external ear canals both ears Throat: lips, mucosa, and tongue normal; teeth and gums normal Neck: no adenopathy, no carotid bruit, supple, symmetrical, trachea midline and thyroid not enlarged, symmetric, no tenderness/mass/nodules Back: symmetric, no curvature. ROM normal. No CVA tenderness. Lungs: clear to auscultation bilaterally Heart: regular rate and rhythm, S1, S2 normal, no murmur, click, rub or gallop Abdomen: soft, non-tender; bowel sounds normal; no masses,  no organomegaly Pulses: 2+ and symmetric Skin: Skin color, texture, turgor normal. No rashes or lesions Lymph nodes: Cervical, supraclavicular, and axillary nodes normal.  Lab Results  Component Value Date   HGBA1C 5.1 10/26/2015   HGBA1C 5.4 11/25/2013    Lab Results  Component Value Date   CREATININE 1.02 (H) 05/23/2020   CREATININE 0.98 01/11/2020   CREATININE 1.07 (H) 11/25/2019    Lab Results  Component Value Date   WBC 9.8 01/11/2020   HGB 9.7 (L)  01/11/2020   HCT 30.5 (L) 01/11/2020   PLT 260 01/11/2020   GLUCOSE 89 05/23/2020   CHOL 107 01/30/2019   TRIG 164 (H) 01/30/2019   HDL 27 (L) 01/30/2019   LDLDIRECT 104 (H) 08/15/2012   LDLCALC 47 01/30/2019   ALT 13 05/23/2020   AST 12 05/23/2020   NA 139 05/23/2020   K 5.0 05/23/2020   CL 105 05/23/2020   CREATININE 1.02 (H) 05/23/2020   BUN 11 05/23/2020   CO2 20 05/23/2020   TSH 1.473 01/29/2019   INR 1.0 09/01/2014   HGBA1C 5.1 10/26/2015    MM 3D SCREEN BREAST BILATERAL  Result Date: 03/29/2020 CLINICAL DATA:  Screening. EXAM: DIGITAL SCREENING BILATERAL MAMMOGRAM WITH TOMO AND CAD COMPARISON:  Previous exam(s). ACR Breast  Density Category c: The breast tissue is heterogeneously dense, which may obscure small masses. FINDINGS: There are no findings suspicious for malignancy. Images were processed with CAD. IMPRESSION: No mammographic evidence of malignancy. A result letter of this screening mammogram will be mailed directly to the patient. RECOMMENDATION: Screening mammogram in one year. (Code:SM-B-01Y) BI-RADS CATEGORY  1: Negative. Electronically Signed   By: Gerome Sam III M.D   On: 03/29/2020 13:01    Assessment & Plan:   Problem List Items Addressed This Visit      Unprioritized   Beta thalassemia (HCC)   Chronic lower back pain - Primary    pain was not controlled with Celebrex and it raised her blood pressure.  Continue use of vicodin TWO TIMES DAILY . Suspended  celebrex and continue tramadol for  daytime use.   Refill history confirmed via [place] Controlled Substance databas, accessed by me today.  Refills  GIVEN FOR 3 months .  Follow up with Neurosurgery as directed.  Encouraged to lose weight        Relevant Orders   Ambulatory referral to Physical Therapy   Proximal leg weakness    Secondary to deconditioning due to chronic low back pain.  Neurologic exam is normal.   She has no difficulty rising from chairs.  PT referral made for strengthening legs.         Other Visit Diagnoses    Balance problem       Relevant Orders   Ambulatory referral to Physical Therapy   Long-term use of high-risk medication       Relevant Orders   Comprehensive metabolic panel (Completed)     I provided  30 minutes of  face-to-face time during this encounter reviewing patient's current problems and past surgeries, labs and imaging studies, providing counseling on the above mentioned problems , and coordination  of care .  I have discontinued Cheryl [name] "Kathy"'s predniSONE. I am also having her maintain her Calcium Carbonate-Vit D-Min (CALCIUM 1200 PO), vitamin C, progesterone, hydrOXYzine, albuterol, tiZANidine, docusate sodium, aspirin EC, traMADol, rosuvastatin, progesterone, buPROPion, nitroGLYCERIN, gabapentin, propranolol, venlafaxine XR, rOPINIRole, HYDROcodone-acetaminophen, HYDROcodone-acetaminophen, HYDROcodone-acetaminophen, and metoprolol succinate.  No orders of the defined types were placed in this encounter.   Medications Discontinued During This Encounter  Medication Reason  . predniSONE (DELTASONE) 10 MG tablet     Follow-up: No follow-ups on file.   Sherlene Shams, MD

## 2020-05-24 DIAGNOSIS — D561 Beta thalassemia: Secondary | ICD-10-CM | POA: Insufficient documentation

## 2020-05-24 DIAGNOSIS — R29898 Other symptoms and signs involving the musculoskeletal system: Secondary | ICD-10-CM | POA: Insufficient documentation

## 2020-05-24 LAB — COMPREHENSIVE METABOLIC PANEL WITH GFR
ALT: 13 IU/L (ref 0–32)
AST: 12 IU/L (ref 0–40)
Albumin/Globulin Ratio: 2 (ref 1.2–2.2)
Albumin: 4.3 g/dL (ref 3.8–4.8)
Alkaline Phosphatase: 81 IU/L (ref 44–121)
BUN/Creatinine Ratio: 11 — ABNORMAL LOW (ref 12–28)
BUN: 11 mg/dL (ref 8–27)
Bilirubin Total: 0.6 mg/dL (ref 0.0–1.2)
CO2: 20 mmol/L (ref 20–29)
Calcium: 9 mg/dL (ref 8.7–10.3)
Chloride: 105 mmol/L (ref 96–106)
Creatinine, Ser: 1.02 mg/dL — ABNORMAL HIGH (ref 0.57–1.00)
GFR calc Af Amer: 67 mL/min/1.73 (ref >59)
GFR calc non Af Amer: 58 mL/min/1.73 — ABNORMAL LOW (ref >59)
Globulin, Total: 2.2 g/dL (ref 1.5–4.5)
Glucose: 89 mg/dL (ref 65–99)
Potassium: 5 mmol/L (ref 3.5–5.2)
Sodium: 139 mmol/L (ref 134–144)
Total Protein: 6.5 g/dL (ref 6.0–8.5)

## 2020-05-24 NOTE — Assessment & Plan Note (Addendum)
pain was not controlled with Celebrex and it raised her blood pressure.  Continue use of vicodin TWO TIMES DAILY . Suspended  celebrex and continue tramadol for  daytime use.   Refill history confirmed via [place] Controlled Substance databas, accessed by me today.  Refills  GIVEN FOR 3 months .  Follow up with Neurosurgery as directed.  Encouraged to lose weight

## 2020-05-24 NOTE — Assessment & Plan Note (Signed)
Secondary to deconditioning due to chronic low back pain.  Neurologic exam is normal.   She has no difficulty rising from chairs.  PT referral made for strengthening legs.

## 2020-05-24 NOTE — Progress Notes (Signed)
Your liver and kidney function are normal.  Please continue your current medications and plan to repeat non fasting labs in 6 months.   Regards,  Dr. Darrick Huntsman

## 2020-05-30 DIAGNOSIS — M76892 Other specified enthesopathies of left lower limb, excluding foot: Secondary | ICD-10-CM | POA: Diagnosis not present

## 2020-05-30 DIAGNOSIS — M25552 Pain in left hip: Secondary | ICD-10-CM | POA: Diagnosis not present

## 2020-05-30 DIAGNOSIS — M7918 Myalgia, other site: Secondary | ICD-10-CM | POA: Diagnosis not present

## 2020-06-01 ENCOUNTER — Other Ambulatory Visit: Payer: Self-pay | Admitting: Internal Medicine

## 2020-06-01 NOTE — Telephone Encounter (Signed)
RX Refill:tramadol Last Seen:05-23-20 Last ordered:11-24-19

## 2020-06-02 MED ORDER — TRAMADOL HCL 50 MG PO TABS: 50.0000 mg | ORAL_TABLET | Freq: Two times a day (BID) | ORAL | 5 refills | Status: DC | PRN

## 2020-06-02 NOTE — Telephone Encounter (Signed)
We should not be sending tramadol to mail order.  I Will refill locally only

## 2020-06-06 ENCOUNTER — Ambulatory Visit: Payer: BC Managed Care – PPO | Attending: Sports Medicine | Admitting: Physical Therapy

## 2020-06-06 ENCOUNTER — Other Ambulatory Visit: Payer: Self-pay

## 2020-06-06 ENCOUNTER — Encounter: Payer: Self-pay | Admitting: Physical Therapy

## 2020-06-06 DIAGNOSIS — R293 Abnormal posture: Secondary | ICD-10-CM | POA: Insufficient documentation

## 2020-06-06 DIAGNOSIS — G8929 Other chronic pain: Secondary | ICD-10-CM | POA: Diagnosis not present

## 2020-06-06 DIAGNOSIS — M25552 Pain in left hip: Secondary | ICD-10-CM | POA: Insufficient documentation

## 2020-06-06 DIAGNOSIS — M545 Low back pain, unspecified: Secondary | ICD-10-CM | POA: Diagnosis not present

## 2020-06-06 NOTE — Therapy (Signed)
[facility] [address] [place], [place], [address] Phone: [phone]   Fax:  [phone]  Physical Therapy Evaluation  Patient Details  Name: Cheryl LaineKathryn Ann Engert MRN: [id number] Date of Birth: 07/05/1955 Referring Provider (PT): Landry MellowKubinski, Mervyn SkeetersA   Encounter Date: 06/06/2020   PT End of Session - 06/06/20 1557    Visit Number 1    Number of Visits 6    Date for PT Re-Evaluation 07/18/20    PT Start Time 1600    PT Stop Time 1655    PT Time Calculation (min) 55 min    Activity Tolerance Patient tolerated treatment well    Behavior During Therapy [facility] for tasks assessed/performed           Past Medical History:  Diagnosis Date  . 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 Arrhythmia    left bundle branch block  . Arthritis   . Blood in stool   . Chicken pox   . Generalized headaches   . History of blood transfusion   . Kidney disease, chronic, stage III (GFR 30-59 ml/min) (HCC)   . LBBB (left bundle branch block)   . Renal disorder   . Thyroid disease   . UTI (lower urinary tract infection)     Past Surgical History:  Procedure Laterality Date  . ABDOMINAL HYSTERECTOMY  1997  . APPENDECTOMY  1981  . BREAST EXCISIONAL BIOPSY Bilateral 1976   neg  . BREAST SURGERY  1976  . CARDIAC CATHETERIZATION  2004   [facility]  . CARDIAC CATHETERIZATION  2006   [facility]  . cystic fibrosis tumor removal  1983  . THUMB AMPUTATION  1992   traumatic  . TONSILLECTOMY AND ADENOIDECTOMY  1964    There were no vitals filed for this visit.        [facility] PT Assessment - 06/06/20 0001      Assessment   Medical Diagnosis Left buttock pain    Referring Provider (PT) Landry MellowKubinski, A    Onset Date/Surgical Date 10/27/19    Hand Dominance Right;Left    Next MD Visit Follow up PRN    Prior Therapy Yes; didn't work and  no faith in it working      Balance Screen   Has the patient fallen in the past 6 months No    Has the patient had a decrease in activity level because of a fear of falling?  Yes    Is the patient reluctant to leave their home because of a fear of falling?  No          SUBJECTIVE Chief complaint:  Patient reports pain in left buttock which has been ongoing since June 2021. She states that she continues to function through the pain, but that the pain is really wearing on her at this point. She has tried multiple different approaches to management including: injections, PT, chiropractic medicine, and consult with neurosurgery. She has not been able to participate in regular fitness activities as a result of the pain. Patient reports that she also has occasional groin pain and lower abdominal pain. She adds that her abdominal scar is hard and like a shelf which causes her discomfort. Additionally, she notes her hysterectomy was performed as part of endometriosis management and she had endometrial adhesions throughout her pelvis and abdomen including bowels. She expresses some curiosity as to  whether some of her continued pain may be related to scar restriction/tissue.  Per MD note: "Her pain began gradually in June 2021 with no acute trauma or injury. She denies any previous accident, injury, trauma to her low back or hip. The pain is located on her left buttock and radiates posteriorly down the posterior left leg. She denies any radiation of symptoms into her feet. She describes her pain as sharp and stabbing. It is aggravated by sitting, driving. She currently rates pain severity as a 8/10. She reports associated limping, weakness, difficulty walking, instability, and pain at night. She denies associated bruising or skin color change, clicking, locking, catching, nausea, night sweats, fever/chills, bowel or bladder changes, saddle anesthesia. She has tried gabapentin, cyclobenzaprine, prednisone, NSAIDs,  Tylenol, Narcotics, Chiropractor with home exercise, multiple left-sided epidural steroid injections."  Pain location: L buttock pain Pain: Present  6/10, Best 2-3/10, Worst 8/10 Pain quality: pain quality: sharp and stabbing Radiating pain: Yes to the foot on LLE Numbness/Tingling: No 24 hour pain behavior: activity dependent Aggravating factors: bending, sitting, lifting, stairs Easing factors: heating pad, massage, spanking How long can you sit: 3-4 hours How long can you stand: 3-4 hours How long can you walk: unlimited  Imaging: Yes: "Lumbar spine MRI without contrast performed 03/23/2020 at [facility]  IMPRESSION:  1. Central L5-S1 protrusion abutting the descending S1 nerve roots  with left predominance. Left predominant mild bilateral neural  foraminal narrowing is progressed since prior exam.  2. L4-5 mild spinal canal and neural foraminal narrowing, unchanged."  Occupational demands: sitting at desk Hobbies: gardening, sewing, crocheting, baking, chickens, 2 dogs, 2 cats, lifting weights/cycling Engineer, materials)  Goals: "Figure out what this problem is and get it straight."  Red flags (bowel/bladder changes, saddle paresthesia, personal history of cancer, chills/fever, night sweats, unrelenting pain, first onset of insidious LBP <20 y/o) Negative    OBJECTIVE  Mental Status Patient is oriented to person, place and time.  Recent memory is intact.  Remote memory is intact.  Attention span and concentration are intact.  Expressive speech is intact.  Patient's fund of knowledge is within normal limits for educational level.   MUSCULOSKELETAL: Tremor: None Bulk: Normal Tone: Normal  Posture Lumbar lordosis: mildly increased Iliac crest height: seemingly equal bilaterally Lumbar lateral shift: negative  Gait Grossly WFL   Strength (out of 5) Deferred 2/2 to extensive history taking; no gross deficits apparent on observation   AROM (degrees) Deferred 2/2 to  extensive history taking  Repeated Movements Repeated lumbar flexion increases pain at ischial tuberosity.   Muscle Length Deferred 2/2 to extensive history taking   Passive Accessory Intervertebral Motion (PAIVM) Deferred 2/2 to extensive history taking  Passive Physiological Intervertebral Motion (PPIVM) Deferred 2/2 to extensive history taking   SPECIAL TESTS Deferred 2/2 to extensive history taking   ASSESSMENT Patient is a 65 year old presenting to clinic with chief complaints of L buttock pain. Upon evaluation, patient demonstrates deficits in LLE function, LLE pain, scar mobility, posture as evidenced by difficulty with climbing stairs, 8/10 worst pain at L ischial tuberosity with occasional radiating symptoms to the L foot, increased scar tissue with apparent tissue restriction at lower abdomen, and mildly increased lumbar lordosis with decreased WB through L in sitting posture. Due to patient's previous unsuccessful course of care, today's evaluation was limited to observation and history taking in order to best understand patient perspective and preferences, as well as initiate a productive therapeutic alliance. Patient's responses on FOTO outcome measure (51)  indicates moderate functional limitations/disability. Patient's progress may be limited due to occupational demands and highly active lifestyle; however, patient's motivation and current health behaviors are advantageous. Patient was able to achieve basic understanding of pelvic anatomy during today's evaluation and responded positively to educational interventions. Patient will benefit from continued skilled therapeutic intervention to address deficits in LLE function, LLE pain, scar mobility, posture in order to return to previous fitness activities, increase function, and improve overall QOL.   Patient educated on prognosis, POC, and provided with HEP including: scar massage. Patient articulated understanding and returned  demonstration. Patient will benefit from further education in order to maximize compliance and understanding for long-term therapeutic gains.   TREATMENT  Neuromuscular Re-education: Patient education on pelvic anatomy and nerve anatomy for improved understanding of pain and posture. Patient education on sitting modifications for decreased ischial tuberosity irritation and improved body mechanics.    Objective measurements completed on examination: See above findings.                    PT Long Term Goals - 06/06/20 1750      PT LONG TERM GOAL #1   Title Patient will demonstrate independence with HEP in order to maximize therapeutic gains and improve carryover from physical therapy sessions to ADLs in the home and community.    Baseline IE: not demonstrated    Time 6    Period Weeks    Status New    Target Date 07/18/20      PT LONG TERM GOAL #2   Title Patient will decrease worst pain as reported on NPRS by at least 2 points to demonstrate clinically significant reduction in pain in order to restore/improve function and overall QOL.    Baseline IE: 8/10    Time 6    Period Weeks    Status New    Target Date 07/18/20      PT LONG TERM GOAL #3   Title Patient will report being able to return to activities including, but not limited to: gym-based fitness program and gardening without pain or limitation to indicate complete resolution of the chief complaint and return to prior level of participation at home and in the community.    Baseline IE: unable    Time 6    Period Weeks    Status New    Target Date 07/18/20      PT LONG TERM GOAL #4   Title Patient will demonstrate improved function as evidenced by a score of 61 on FOTO measure for full participation in activities at home and in the community.    Baseline IE: 51    Time 6    Period Weeks    Status New    Target Date 07/18/20                  Plan - 06/06/20 1558    Clinical Impression  Statement Patient is a 65 year old presenting to clinic with chief complaints of L buttock pain. Upon evaluation, patient demonstrates deficits in LLE function, LLE pain, scar mobility, posture as evidenced by difficulty with climbing stairs, 8/10 worst pain at L ischial tuberosity with occasional radiating symptoms to the L foot, increased scar tissue with apparent tissue restriction at lower abdomen, and mildly increased lumbar lordosis with decreased WB through L in sitting posture. Due to patient's previous unsuccessful course of care, today's evaluation was limited to observation and history taking in order to best understand patient perspective  and preferences, as well as initiate a productive therapeutic alliance. Patient's responses on FOTO outcome measure (51) indicates moderate functional limitations/disability. Patient's progress may be limited due to occupational demands and highly active lifestyle; however, patient's motivation and current health behaviors are advantageous. Patient was able to achieve basic understanding of pelvic anatomy during today's evaluation and responded positively to educational interventions. Patient will benefit from continued skilled therapeutic intervention to address deficits in LLE function, LLE pain, scar mobility, posture in order to return to previous fitness activities, increase function, and improve overall QOL.    Personal Factors and Comorbidities Age;Behavior Pattern;Comorbidity 3+;Past/Current Experience;Time since onset of injury/illness/exacerbation;Profession    Comorbidities ANA positive with polyarthralgia, HTN, CKD, hx of abdominal hysterectomy, thyroid disease, IBS, OSA, anxiety, diverticulosis, dyslipidemia, PAD,    Examination-Activity Limitations Sit;Lift;Stairs    Examination-Participation Restrictions Occupation;Driving    Stability/Clinical Decision Making Evolving/Moderate complexity    Clinical Decision Making Moderate    Rehab Potential Fair     PT Frequency 1x / week    PT Duration 6 weeks    PT Treatment/Interventions Aquatic Therapy;ADLs/Self Care Home Management;Electrical Stimulation;Moist Heat;Cryotherapy;Iontophoresis 4mg /ml Dexamethasone;Dry needling;Taping;Scar mobilization;Joint Manipulations;Spinal Manipulations;Manual techniques;Patient/family education;Neuromuscular re-education;Balance training;Therapeutic exercise;Therapeutic activities;Gait training;DME Instruction    PT Next Visit Plan scar massage; physical assessment    PT Home Exercise Plan scar massage    Consulted and Agree with Plan of Care Patient           Patient will benefit from skilled therapeutic intervention in order to improve the following deficits and impairments:  Abnormal gait,Decreased balance,Pain,Postural dysfunction,Improper body mechanics,Impaired flexibility,Decreased strength,Decreased coordination  Visit Diagnosis: Pain in left hip  Chronic left-sided low back pain, unspecified whether sciatica present  Abnormal posture     Problem List Patient Active Problem List   Diagnosis Date Noted  . Beta thalassemia (HCC) 05/24/2020  . Proximal leg weakness 05/24/2020  . 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 Orthostatic hypotension 11/25/2019  . Hospital discharge follow-up 02/15/2019  . Chest pain 01/29/2019  . Restless legs 11/09/2018  . Educated about COVID-19 virus infection 11/09/2018  . Osteoarthritis of both shoulders 08/03/2018  . Cervical radiculopathy 12/30/2017  . Carotid artery stenosis 09/09/2017  . Nonallopathic lesion of cervical region 08/13/2017  . Nonallopathic lesion of thoracic region 08/13/2017  . Nonallopathic lesion of lumbosacral region 08/13/2017  . Chronic renal insufficiency 04/03/2017  . Renal hypertension 12/16/2016  . Diverticulosis of colon 09/02/2016  . PAD (peripheral artery disease) (HCC) 08/22/2016  . Renal artery stenosis (HCC) 08/22/2016  . Iron deficiency anemia 11/25/2015  .  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 ANA positive 06/21/2015  . Chronic neck pain 06/21/2015  . Arthralgia 05/19/2015  . Dyslipidemia (high LDL; low HDL) 04/16/2015  . Palpitations 03/28/2015  . Pancreatic cyst 10/24/2014  . Multiple lung nodules 10/24/2014  . Hyperlipidemia 10/20/2014  . Chronic lower back pain 09/18/2013  . Screening for osteoporosis 08/20/2013  . Routine general medical examination at a health care facility 10/26/2012  . Anxiety 08/22/2012  . Irritable bowel syndrome 08/17/2012  . History of tobacco abuse 08/17/2012  . Hearing loss d/t noise 08/17/2012  . Diverticulosis 08/17/2012  . OSA (obstructive sleep apnea) 08/17/2012  . Hematuria, microscopic 08/16/2012  . Thalassemia minor 08/16/2012   08/18/2012 PT, DPT [phone]  06/06/2020, 6:04 PM   [facility] [address] [place], [place], [place] Phone: [phone]   Fax:  [phone]  Name: Jesenia Spera MRN: Cheryl [name] Date of  Birth: 1955/10/17

## 2020-06-07 ENCOUNTER — Ambulatory Visit: Payer: BC Managed Care – PPO

## 2020-06-10 ENCOUNTER — Ambulatory Visit: Payer: BC Managed Care – PPO

## 2020-06-13 ENCOUNTER — Ambulatory Visit: Payer: BC Managed Care – PPO | Admitting: Physical Therapy

## 2020-06-14 ENCOUNTER — Ambulatory Visit: Payer: BC Managed Care – PPO

## 2020-06-16 ENCOUNTER — Ambulatory Visit: Payer: BC Managed Care – PPO

## 2020-06-20 ENCOUNTER — Encounter: Payer: Self-pay | Admitting: Physical Therapy

## 2020-06-20 ENCOUNTER — Ambulatory Visit: Payer: BC Managed Care – PPO | Admitting: Physical Therapy

## 2020-06-20 ENCOUNTER — Other Ambulatory Visit: Payer: Self-pay

## 2020-06-20 DIAGNOSIS — G8929 Other chronic pain: Secondary | ICD-10-CM

## 2020-06-20 DIAGNOSIS — M545 Low back pain, unspecified: Secondary | ICD-10-CM | POA: Diagnosis not present

## 2020-06-20 DIAGNOSIS — M25552 Pain in left hip: Secondary | ICD-10-CM | POA: Diagnosis not present

## 2020-06-20 DIAGNOSIS — R293 Abnormal posture: Secondary | ICD-10-CM

## 2020-06-20 NOTE — Therapy (Signed)
[facility] [address] [place], [place], [address] Phone: [phone]   Fax:  [phone]  Physical Therapy Treatment  Patient Details  Name: Cheryl [name] MRN: [id number] Date of Birth: [birth year] Referring Provider (PT): Landry Mellow, Mervyn Skeeters   Encounter Date: 06/20/2020   PT End of Session - 06/20/20 1607    Visit Number 2    Number of Visits 6    Date for PT Re-Evaluation 07/18/20    PT Start Time 1600    PT Stop Time 1655    PT Time Calculation (min) 55 min    Activity Tolerance Patient tolerated treatment well    Behavior During Therapy [facility] for tasks assessed/performed           Past Medical History:  Diagnosis Date  . 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 Arrhythmia    left bundle branch block  . Arthritis   . Blood in stool   . Chicken pox   . Generalized headaches   . History of blood transfusion   . Kidney disease, chronic, stage III (GFR 30-59 ml/min) (HCC)   . LBBB (left bundle branch block)   . Renal disorder   . Thyroid disease   . UTI (lower urinary tract infection)     Past Surgical History:  Procedure Laterality Date  . ABDOMINAL HYSTERECTOMY  1997  . APPENDECTOMY  1981  . BREAST EXCISIONAL BIOPSY Bilateral 1976   neg  . BREAST SURGERY  1976  . CARDIAC CATHETERIZATION  2004   [facility]  . CARDIAC CATHETERIZATION  2006   [facility]  . cystic fibrosis tumor removal  1983  . THUMB AMPUTATION  1992   traumatic  . TONSILLECTOMY AND ADENOIDECTOMY  1964    There were no vitals filed for this visit.   Subjective Assessment - 06/20/20 1603    Subjective Patient notes that pain is worse today than on evaluation. Patient states that she feels the cold impacts her neck and her bum. Patient notes that she is also having increased pain in antero-lateral R hip pain.    Currently in  Pain? Yes    Pain Score 8     Pain Location Buttocks    Pain Orientation Left           TREATMENT  Pre-treatment assessment: Leg length discrepancy, L 80.5 cm, R 82 cm. L anterior pelvic obliquity. Strength 5/5 throughout B.  Manual Therapy: STM and TPR performed to B glute complexes, L>R to allow for decreased tension and pain and improved posture and function L innominate mobilizations for decreased spasm and improved mobility, grade II/III L sacrotuberous and sacroiliac ligament STM  Neuromuscular Re-education: Glute sets for improved relaxation of gluteal m and decreased tension  HEP for pain modulation: Supine figure 4 stretch, supine single knee to chest, supine double knee to chest Gait with heel lift in L shoe for improved posture and decreased tensile forces on LLE during stance Patient education on body mechanics/posture in sitting and use of modifications such as pillow to off load ischial tuberosity compression.   Patient educated throughout session on appropriate technique and form using multi-modal cueing, HEP, and activity modification. Patient articulated understanding and returned demonstration.  Patient Response to interventions: 5/10 pain  ASSESSMENT Patient presents to clinic with excellent motivation to participate in therapy. Patient demonstrates deficits in LLE function, LLE pain,  scar mobility, posture. Patient able to achieve significant pain reduction with manual interventions during today's session and responded positively to educational interventions. Patient will benefit from continued skilled therapeutic intervention to address remaining deficits in LLE function, LLE pain, scar mobility, posture in order to increase function and improve overall QOL.     PT Long Term Goals - 06/06/20 1750      PT LONG TERM GOAL #1   Title Patient will demonstrate independence with HEP in order to maximize therapeutic gains and improve carryover from physical therapy  sessions to ADLs in the home and community.    Baseline IE: not demonstrated    Time 6    Period Weeks    Status New    Target Date 07/18/20      PT LONG TERM GOAL #2   Title Patient will decrease worst pain as reported on NPRS by at least 2 points to demonstrate clinically significant reduction in pain in order to restore/improve function and overall QOL.    Baseline IE: 8/10    Time 6    Period Weeks    Status New    Target Date 07/18/20      PT LONG TERM GOAL #3   Title Patient will report being able to return to activities including, but not limited to: gym-based fitness program and gardening without pain or limitation to indicate complete resolution of the chief complaint and return to prior level of participation at home and in the community.    Baseline IE: unable    Time 6    Period Weeks    Status New    Target Date 07/18/20      PT LONG TERM GOAL #4   Title Patient will demonstrate improved function as evidenced by a score of 61 on FOTO measure for full participation in activities at home and in the community.    Baseline IE: 51    Time 6    Period Weeks    Status New    Target Date 07/18/20                 Plan - 06/20/20 1607    Clinical Impression Statement Patient presents to clinic with excellent motivation to participate in therapy. Patient demonstrates deficits in LLE function, LLE pain, scar mobility, posture. Patient able to achieve significant pain reduction with manual interventions during today's session and responded positively to educational interventions. Patient will benefit from continued skilled therapeutic intervention to address remaining deficits in LLE function, LLE pain, scar mobility, posture in order to increase function and improve overall QOL.    Personal Factors and Comorbidities Age;Behavior Pattern;Comorbidity 3+;Past/Current Experience;Time since onset of injury/illness/exacerbation;Profession    Comorbidities ANA positive with  polyarthralgia, HTN, CKD, hx of abdominal hysterectomy, thyroid disease, IBS, OSA, anxiety, diverticulosis, dyslipidemia, PAD,    Examination-Activity Limitations Sit;Lift;Stairs    Examination-Participation Restrictions Occupation;Driving    Stability/Clinical Decision Making Evolving/Moderate complexity    Rehab Potential Fair    PT Frequency 1x / week    PT Duration 6 weeks    PT Treatment/Interventions Aquatic Therapy;ADLs/Self Care Home Management;Electrical Stimulation;Moist Heat;Cryotherapy;Iontophoresis 4mg /ml Dexamethasone;Dry needling;Taping;Scar mobilization;Joint Manipulations;Spinal Manipulations;Manual techniques;Patient/family education;Neuromuscular re-education;Balance training;Therapeutic exercise;Therapeutic activities;Gait training;DME Instruction    PT Next Visit Plan manual; scar massage; postural re-ed    PT Home Exercise Plan scar massage    Consulted and Agree with Plan of Care Patient           Patient will benefit from skilled therapeutic intervention in  order to improve the following deficits and impairments:  Abnormal gait,Decreased balance,Pain,Postural dysfunction,Improper body mechanics,Impaired flexibility,Decreased strength,Decreased coordination  Visit Diagnosis: Pain in left hip  Chronic left-sided low back pain, unspecified whether sciatica present  Abnormal posture     Problem List Patient Active Problem List   Diagnosis Date Noted  . Beta thalassemia (HCC) 05/24/2020  . Proximal leg weakness 05/24/2020  . 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 Orthostatic hypotension 11/25/2019  . Hospital discharge follow-up 02/15/2019  . Chest pain 01/29/2019  . Restless legs 11/09/2018  . Educated about COVID-19 virus infection 11/09/2018  . Osteoarthritis of both shoulders 08/03/2018  . Cervical radiculopathy 12/30/2017  . Carotid artery stenosis 09/09/2017  . Nonallopathic lesion of cervical region 08/13/2017  . Nonallopathic lesion of  thoracic region 08/13/2017  . Nonallopathic lesion of lumbosacral region 08/13/2017  . Chronic renal insufficiency 04/03/2017  . Renal hypertension 12/16/2016  . Diverticulosis of colon 09/02/2016  . PAD (peripheral artery disease) (HCC) 08/22/2016  . Renal artery stenosis (HCC) 08/22/2016  . Iron deficiency anemia 11/25/2015  . 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 ANA positive 06/21/2015  . Chronic neck pain 06/21/2015  . Arthralgia 05/19/2015  . Dyslipidemia (high LDL; low HDL) 04/16/2015  . Palpitations 03/28/2015  . Pancreatic cyst 10/24/2014  . Multiple lung nodules 10/24/2014  . Hyperlipidemia 10/20/2014  . Chronic lower back pain 09/18/2013  . Screening for osteoporosis 08/20/2013  . Routine general medical examination at a health care facility 10/26/2012  . Anxiety 08/22/2012  . Irritable bowel syndrome 08/17/2012  . History of tobacco abuse 08/17/2012  . Hearing loss d/t noise 08/17/2012  . Diverticulosis 08/17/2012  . OSA (obstructive sleep apnea) 08/17/2012  . Hematuria, microscopic 08/16/2012  . Thalassemia minor 08/16/2012   Sheria Lang PT, DPT [phone]   06/20/2020, 5:31 PM  [place] [facility] [address] [place], [place], [address] Phone: [phone]   Fax:  [phone]  Name: Kilie Rund MRN: [id number] Date of Birth: 03-31-1956

## 2020-06-21 ENCOUNTER — Ambulatory Visit: Payer: BC Managed Care – PPO

## 2020-06-23 ENCOUNTER — Ambulatory Visit: Payer: BC Managed Care – PPO

## 2020-06-27 ENCOUNTER — Other Ambulatory Visit: Payer: Self-pay

## 2020-06-27 ENCOUNTER — Ambulatory Visit: Payer: BC Managed Care – PPO | Admitting: Physical Therapy

## 2020-06-27 ENCOUNTER — Encounter: Payer: Self-pay | Admitting: Physical Therapy

## 2020-06-27 DIAGNOSIS — M545 Low back pain, unspecified: Secondary | ICD-10-CM | POA: Diagnosis not present

## 2020-06-27 DIAGNOSIS — R293 Abnormal posture: Secondary | ICD-10-CM | POA: Diagnosis not present

## 2020-06-27 DIAGNOSIS — M25552 Pain in left hip: Secondary | ICD-10-CM

## 2020-06-27 DIAGNOSIS — G8929 Other chronic pain: Secondary | ICD-10-CM | POA: Diagnosis not present

## 2020-06-27 NOTE — Therapy (Signed)
[facility] [address] [place], [place], [address] Phone: [phone]   Fax:  [phone]  Physical Therapy Treatment  Patient Details  Name: Cheryl [name] MRN: [id number] Date of Birth: 04-02-1956 Referring Provider (PT): Richard Miu   Encounter Date: 06/27/2020   PT End of Session - 06/27/20 1612    Visit Number 3    Number of Visits 6    Date for PT Re-Evaluation 07/18/20    PT Start Time 1600    PT Stop Time 1655    PT Time Calculation (min) 55 min    Activity Tolerance Patient tolerated treatment well    Behavior During Therapy [facility] for tasks assessed/performed           Past Medical History:  Diagnosis Date  . 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 Arrhythmia    left bundle branch block  . Arthritis   . Blood in stool   . Chicken pox   . Generalized headaches   . History of blood transfusion   . Kidney disease, chronic, stage III (GFR 30-59 ml/min) (HCC)   . LBBB (left bundle branch block)   . Renal disorder   . Thyroid disease   . UTI (lower urinary tract infection)     Past Surgical History:  Procedure Laterality Date  . ABDOMINAL HYSTERECTOMY  1997  . APPENDECTOMY  1981  . BREAST EXCISIONAL BIOPSY Bilateral 1976   neg  . BREAST SURGERY  1976  . CARDIAC CATHETERIZATION  2004   [facility]  . CARDIAC CATHETERIZATION  2006   [facility]  . cystic fibrosis tumor removal  1983  . THUMB AMPUTATION  1992   traumatic  . TONSILLECTOMY AND ADENOIDECTOMY  1964    There were no vitals filed for this visit.   Subjective Assessment - 06/27/20 1602    Subjective Patient notes that she is improved from last visit. Patient states that she has been able to tolerate the stretches and HEP. Patient notes that yesterday was "really bad" and states that it "must've been the way I was sleeping."  Patient notes L buttock pain has been well managed with seat modifications and describes it as "sore" not painful.    Currently in Pain? Yes    Pain Score 4     Pain Location Buttocks    Pain Orientation Left    Pain Descriptors / Indicators Sore            TREATMENT  Manual Therapy: STM and TPR performed to B glute complexes, L>R to allow for decreased tension and pain and improved posture and function L innominate mobilizations for decreased spasm and improved mobility, grade II/III L sacrotuberous and sacroiliac ligament STM  Neuromuscular Re-education: Reviewed HEP.  Patient education on impact of footwear on gluteal mm position and tension for improved carryover of tension release. Prone diaphragmatic breathing for nervous system downtraining. Prone hip extension stretch (PT assisted) for improved pelvic posture and relaxation of B hip mm Prone hip IR, B, for improved length in the gluteal mm and decreased pain/tension   Patient educated throughout session on appropriate technique and form using multi-modal cueing, HEP, and activity modification. Patient articulated understanding and returned demonstration.  Patient Response to interventions: Comfortable to continue working with current HEP  ASSESSMENT Patient presents to clinic with excellent motivation to participate in therapy. Patient demonstrates  deficits in LLE function, LLE pain, scar mobility, posture. Patient able to have decrease resting tone in B gluteal mm with prone hip IR exercise during today's session and responded positively to educational interventions. Patient will benefit from continued skilled therapeutic intervention to address remaining deficits in LLE function, LLE pain, scar mobility, posture in order to increase function and improve overall QOL.     PT Long Term Goals - 06/06/20 1750      PT LONG TERM GOAL #1   Title Patient will demonstrate independence with HEP in order to maximize therapeutic  gains and improve carryover from physical therapy sessions to ADLs in the home and community.    Baseline IE: not demonstrated    Time 6    Period Weeks    Status New    Target Date 07/18/20      PT LONG TERM GOAL #2   Title Patient will decrease worst pain as reported on NPRS by at least 2 points to demonstrate clinically significant reduction in pain in order to restore/improve function and overall QOL.    Baseline IE: 8/10    Time 6    Period Weeks    Status New    Target Date 07/18/20      PT LONG TERM GOAL #3   Title Patient will report being able to return to activities including, but not limited to: gym-based fitness program and gardening without pain or limitation to indicate complete resolution of the chief complaint and return to prior level of participation at home and in the community.    Baseline IE: unable    Time 6    Period Weeks    Status New    Target Date 07/18/20      PT LONG TERM GOAL #4   Title Patient will demonstrate improved function as evidenced by a score of 61 on FOTO measure for full participation in activities at home and in the community.    Baseline IE: 51    Time 6    Period Weeks    Status New    Target Date 07/18/20                 Plan - 06/27/20 1613    Clinical Impression Statement Patient presents to clinic with excellent motivation to participate in therapy. Patient demonstrates deficits in LLE function, LLE pain, scar mobility, posture. Patient able to have decrease resting tone in B gluteal mm with prone hip IR exercise during today's session and responded positively to educational interventions. Patient will benefit from continued skilled therapeutic intervention to address remaining deficits in LLE function, LLE pain, scar mobility, posture in order to increase function and improve overall QOL.    Personal Factors and Comorbidities Age;Behavior Pattern;Comorbidity 3+;Past/Current Experience;Time since onset of  injury/illness/exacerbation;Profession    Comorbidities ANA positive with polyarthralgia, HTN, CKD, hx of abdominal hysterectomy, thyroid disease, IBS, OSA, anxiety, diverticulosis, dyslipidemia, PAD,    Examination-Activity Limitations Sit;Lift;Stairs    Examination-Participation Restrictions Occupation;Driving    Stability/Clinical Decision Making Evolving/Moderate complexity    Rehab Potential Fair    PT Frequency 1x / week    PT Duration 6 weeks    PT Treatment/Interventions Aquatic Therapy;ADLs/Self Care Home Management;Electrical Stimulation;Moist Heat;Cryotherapy;Iontophoresis 4mg /ml Dexamethasone;Dry needling;Taping;Scar mobilization;Joint Manipulations;Spinal Manipulations;Manual techniques;Patient/family education;Neuromuscular re-education;Balance training;Therapeutic exercise;Therapeutic activities;Gait training;DME Instruction    PT Next Visit Plan manual; scar massage; postural re-ed    PT Home Exercise Plan scar massage    Consulted and Agree with Plan of Care Patient  Patient will benefit from skilled therapeutic intervention in order to improve the following deficits and impairments:  Abnormal gait,Decreased balance,Pain,Postural dysfunction,Improper body mechanics,Impaired flexibility,Decreased strength,Decreased coordination  Visit Diagnosis: Pain in left hip  Chronic left-sided low back pain, unspecified whether sciatica present  Abnormal posture     Problem List Patient Active Problem List   Diagnosis Date Noted  . Beta thalassemia (HCC) 05/24/2020  . Proximal leg weakness 05/24/2020  . 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 Orthostatic hypotension 11/25/2019  . Hospital discharge follow-up 02/15/2019  . Chest pain 01/29/2019  . Restless legs 11/09/2018  . Educated about COVID-19 virus infection 11/09/2018  . Osteoarthritis of both shoulders 08/03/2018  . Cervical radiculopathy 12/30/2017  . Carotid artery stenosis 09/09/2017  .  Nonallopathic lesion of cervical region 08/13/2017  . Nonallopathic lesion of thoracic region 08/13/2017  . Nonallopathic lesion of lumbosacral region 08/13/2017  . Chronic renal insufficiency 04/03/2017  . Renal hypertension 12/16/2016  . Diverticulosis of colon 09/02/2016  . PAD (peripheral artery disease) (HCC) 08/22/2016  . Renal artery stenosis (HCC) 08/22/2016  . Iron deficiency anemia 11/25/2015  . 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 ANA positive 06/21/2015  . Chronic neck pain 06/21/2015  . Arthralgia 05/19/2015  . Dyslipidemia (high LDL; low HDL) 04/16/2015  . Palpitations 03/28/2015  . Pancreatic cyst 10/24/2014  . Multiple lung nodules 10/24/2014  . Hyperlipidemia 10/20/2014  . Chronic lower back pain 09/18/2013  . Screening for osteoporosis 08/20/2013  . Routine general medical examination at a health care facility 10/26/2012  . Anxiety 08/22/2012  . Irritable bowel syndrome 08/17/2012  . History of tobacco abuse 08/17/2012  . Hearing loss d/t noise 08/17/2012  . Diverticulosis 08/17/2012  . OSA (obstructive sleep apnea) 08/17/2012  . Hematuria, microscopic 08/16/2012  . Thalassemia minor 08/16/2012   Sheria Lang PT, DPT [phone]  06/27/2020, 6:24 PM  [facility] [address] [place], [place], [address] Phone: [phone]   Fax:  [phone]  Name: Cheryl [name] MRN: [id number] Date of Birth: July 23, 1955

## 2020-07-04 ENCOUNTER — Other Ambulatory Visit: Payer: Self-pay

## 2020-07-04 ENCOUNTER — Ambulatory Visit: Payer: BC Managed Care – PPO | Attending: Sports Medicine | Admitting: Physical Therapy

## 2020-07-04 ENCOUNTER — Encounter: Payer: Self-pay | Admitting: Physical Therapy

## 2020-07-04 DIAGNOSIS — G8929 Other chronic pain: Secondary | ICD-10-CM | POA: Insufficient documentation

## 2020-07-04 DIAGNOSIS — M25552 Pain in left hip: Secondary | ICD-10-CM | POA: Diagnosis not present

## 2020-07-04 DIAGNOSIS — R293 Abnormal posture: Secondary | ICD-10-CM | POA: Insufficient documentation

## 2020-07-04 DIAGNOSIS — M545 Low back pain, unspecified: Secondary | ICD-10-CM | POA: Diagnosis not present

## 2020-07-04 NOTE — Therapy (Signed)
[facility] [address] [place], [place], [address] Phone: [phone]   Fax:  [phone]  Physical Therapy Treatment  Patient Details  Name: Cheryl [name] MRN: [id number] Date of Birth: December 15, 1955 Referring Provider (PT): Richard Miu   Encounter Date: 07/04/2020   PT End of Session - 07/04/20 1618    Visit Number 4    Number of Visits 6    Date for PT Re-Evaluation 07/18/20    PT Start Time 1607    PT Stop Time 1700    PT Time Calculation (min) 53 min    Activity Tolerance Patient tolerated treatment well    Behavior During Therapy [facility] for tasks assessed/performed           Past Medical History:  Diagnosis Date  . 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 Arrhythmia    left bundle branch block  . Arthritis   . Blood in stool   . Chicken pox   . Generalized headaches   . History of blood transfusion   . Kidney disease, chronic, stage III (GFR 30-59 ml/min) (HCC)   . LBBB (left bundle branch block)   . Renal disorder   . Thyroid disease   . UTI (lower urinary tract infection)     Past Surgical History:  Procedure Laterality Date  . ABDOMINAL HYSTERECTOMY  1997  . APPENDECTOMY  1981  . BREAST EXCISIONAL BIOPSY Bilateral 1976   neg  . BREAST SURGERY  1976  . CARDIAC CATHETERIZATION  2004   [facility]  . CARDIAC CATHETERIZATION  2006   [facility]  . cystic fibrosis tumor removal  1983  . THUMB AMPUTATION  1992   traumatic  . TONSILLECTOMY AND ADENOIDECTOMY  1964    There were no vitals filed for this visit.   Subjective Assessment - 07/04/20 1611    Subjective Patient arrives to clinic late 2/2 to work conflict. Patient notes her pain is well controlled and she has not had anythign but a little soreness. Patient notes that she does still have some pain when sitting on a hard surface  for prolonged periods of time > 2 hours.    Currently in Pain? Yes    Pain Score 2     Pain Location Buttocks    Pain Orientation Left    Pain Descriptors / Indicators Sore           TREATMENT  Manual Therapy: Scar mobilization at lower abdomen to allow for improved mobility and function  Neuromuscular Re-education: Reviewed HEP.  Supine hooklying diaphragmatic breathing with VCs and TCs for downregulation of the nervous system and improved management of IAP    Patient educated throughout session on appropriate technique and form using multi-modal cueing, HEP, and activity modification. Patient articulated understanding and returned demonstration.  Patient Response to interventions: Comfortable to continue working with current HEP  ASSESSMENT Patient presents to clinic with excellent motivation to participate in therapy. Patient demonstrates deficits in LLE function, LLE pain, scar mobility, posture. Patient able to tolerate moderately aggressive scar massage during today's session and responded positively to manual interventions. Patient will benefit from continued skilled therapeutic intervention to address remaining deficits in LLE function, LLE pain, scar mobility, posture in order to increase function and improve overall QOL.     PT Long Term Goals - 06/06/20 1750      PT  LONG TERM GOAL #1   Title Patient will demonstrate independence with HEP in order to maximize therapeutic gains and improve carryover from physical therapy sessions to ADLs in the home and community.    Baseline IE: not demonstrated    Time 6    Period Weeks    Status New    Target Date 07/18/20      PT LONG TERM GOAL #2   Title Patient will decrease worst pain as reported on NPRS by at least 2 points to demonstrate clinically significant reduction in pain in order to restore/improve function and overall QOL.    Baseline IE: 8/10    Time 6    Period Weeks    Status New    Target Date 07/18/20       PT LONG TERM GOAL #3   Title Patient will report being able to return to activities including, but not limited to: gym-based fitness program and gardening without pain or limitation to indicate complete resolution of the chief complaint and return to prior level of participation at home and in the community.    Baseline IE: unable    Time 6    Period Weeks    Status New    Target Date 07/18/20      PT LONG TERM GOAL #4   Title Patient will demonstrate improved function as evidenced by a score of 61 on FOTO measure for full participation in activities at home and in the community.    Baseline IE: 51    Time 6    Period Weeks    Status New    Target Date 07/18/20                 Plan - 07/04/20 1618    Clinical Impression Statement Patient presents to clinic with excellent motivation to participate in therapy. Patient demonstrates deficits in LLE function, LLE pain, scar mobility, posture. Patient able to tolerate moderately aggressive scar massage during today's session and responded positively to manual interventions. Patient will benefit from continued skilled therapeutic intervention to address remaining deficits in LLE function, LLE pain, scar mobility, posture in order to increase function and improve overall QOL.    Personal Factors and Comorbidities Age;Behavior Pattern;Comorbidity 3+;Past/Current Experience;Time since onset of injury/illness/exacerbation;Profession    Comorbidities ANA positive with polyarthralgia, HTN, CKD, hx of abdominal hysterectomy, thyroid disease, IBS, OSA, anxiety, diverticulosis, dyslipidemia, PAD,    Examination-Activity Limitations Sit;Lift;Stairs    Examination-Participation Restrictions Occupation;Driving    Stability/Clinical Decision Making Evolving/Moderate complexity    Rehab Potential Fair    PT Frequency 1x / week    PT Duration 6 weeks    PT Treatment/Interventions Aquatic Therapy;ADLs/Self Care Home Management;Electrical  Stimulation;Moist Heat;Cryotherapy;Iontophoresis 4mg /ml Dexamethasone;Dry needling;Taping;Scar mobilization;Joint Manipulations;Spinal Manipulations;Manual techniques;Patient/family education;Neuromuscular re-education;Balance training;Therapeutic exercise;Therapeutic activities;Gait training;DME Instruction    PT Next Visit Plan manual; scar massage; postural re-ed    PT Home Exercise Plan scar massage    Consulted and Agree with Plan of Care Patient           Patient will benefit from skilled therapeutic intervention in order to improve the following deficits and impairments:  Abnormal gait,Decreased balance,Pain,Postural dysfunction,Improper body mechanics,Impaired flexibility,Decreased strength,Decreased coordination  Visit Diagnosis: Pain in left hip  Chronic left-sided low back pain, unspecified whether sciatica present  Abnormal posture     Problem List Patient Active Problem List   Diagnosis Date Noted  . Beta thalassemia (HCC) 05/24/2020  . Proximal leg weakness 05/24/2020  . Celiac artery stenosis (  HCC) 12/10/2019  . Orthostatic hypotension 11/25/2019  . Hospital discharge follow-up 02/15/2019  . Chest pain 01/29/2019  . Restless legs 11/09/2018  . Educated about COVID-19 virus infection 11/09/2018  . Osteoarthritis of both shoulders 08/03/2018  . Cervical radiculopathy 12/30/2017  . Carotid artery stenosis 09/09/2017  . Nonallopathic lesion of cervical region 08/13/2017  . Nonallopathic lesion of thoracic region 08/13/2017  . Nonallopathic lesion of lumbosacral region 08/13/2017  . Chronic renal insufficiency 04/03/2017  . Renal hypertension 12/16/2016  . Diverticulosis of colon 09/02/2016  . PAD (peripheral artery disease) (HCC) 08/22/2016  . Renal artery stenosis (HCC) 08/22/2016  . Iron deficiency anemia 11/25/2015  . 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 ANA positive 06/21/2015  . Chronic neck pain 06/21/2015  . Arthralgia 05/19/2015  .  Dyslipidemia (high LDL; low HDL) 04/16/2015  . Palpitations 03/28/2015  . Pancreatic cyst 10/24/2014  . Multiple lung nodules 10/24/2014  . Hyperlipidemia 10/20/2014  . Chronic lower back pain 09/18/2013  . Screening for osteoporosis 08/20/2013  . Routine general medical examination at a health care facility 10/26/2012  . Anxiety 08/22/2012  . Irritable bowel syndrome 08/17/2012  . History of tobacco abuse 08/17/2012  . Hearing loss d/t noise 08/17/2012  . Diverticulosis 08/17/2012  . OSA (obstructive sleep apnea) 08/17/2012  . Hematuria, microscopic 08/16/2012  . Thalassemia minor 08/16/2012   Sheria Lang PT, DPT [phone]  07/04/2020, 6:39 PM  [facility] [address] [place], [place], [address] Phone: [phone]   Fax:  [phone]  Name: Cheryl [name] MRN: [id number] Date of Birth: 1955-07-20

## 2020-07-08 ENCOUNTER — Inpatient Hospital Stay: Payer: BC Managed Care – PPO | Attending: Internal Medicine

## 2020-07-08 ENCOUNTER — Other Ambulatory Visit: Payer: Self-pay

## 2020-07-08 DIAGNOSIS — M129 Arthropathy, unspecified: Secondary | ICD-10-CM | POA: Diagnosis not present

## 2020-07-08 DIAGNOSIS — N183 Chronic kidney disease, stage 3 unspecified: Secondary | ICD-10-CM | POA: Insufficient documentation

## 2020-07-08 DIAGNOSIS — Z803 Family history of malignant neoplasm of breast: Secondary | ICD-10-CM | POA: Diagnosis not present

## 2020-07-08 DIAGNOSIS — F17210 Nicotine dependence, cigarettes, uncomplicated: Secondary | ICD-10-CM | POA: Diagnosis not present

## 2020-07-08 DIAGNOSIS — D509 Iron deficiency anemia, unspecified: Secondary | ICD-10-CM

## 2020-07-08 DIAGNOSIS — R5383 Other fatigue: Secondary | ICD-10-CM | POA: Diagnosis not present

## 2020-07-08 DIAGNOSIS — Z7982 Long term (current) use of aspirin: Secondary | ICD-10-CM | POA: Insufficient documentation

## 2020-07-08 DIAGNOSIS — E538 Deficiency of other specified B group vitamins: Secondary | ICD-10-CM | POA: Insufficient documentation

## 2020-07-08 DIAGNOSIS — D561 Beta thalassemia: Secondary | ICD-10-CM | POA: Insufficient documentation

## 2020-07-08 DIAGNOSIS — R531 Weakness: Secondary | ICD-10-CM | POA: Insufficient documentation

## 2020-07-08 DIAGNOSIS — E079 Disorder of thyroid, unspecified: Secondary | ICD-10-CM | POA: Diagnosis not present

## 2020-07-08 DIAGNOSIS — Z87442 Personal history of urinary calculi: Secondary | ICD-10-CM | POA: Insufficient documentation

## 2020-07-08 DIAGNOSIS — I447 Left bundle-branch block, unspecified: Secondary | ICD-10-CM | POA: Insufficient documentation

## 2020-07-08 DIAGNOSIS — Z79899 Other long term (current) drug therapy: Secondary | ICD-10-CM | POA: Insufficient documentation

## 2020-07-08 LAB — CBC WITH DIFFERENTIAL/PLATELET
Abs Immature Granulocytes: 0.06 10*3/uL (ref 0.00–0.07)
Basophils Absolute: 0.1 10*3/uL (ref 0.0–0.1)
Basophils Relative: 1 %
Eosinophils Absolute: 0.3 10*3/uL (ref 0.0–0.5)
Eosinophils Relative: 2 %
HCT: 32 % — ABNORMAL LOW (ref 36.0–46.0)
Hemoglobin: 10.4 g/dL — ABNORMAL LOW (ref 12.0–15.0)
Immature Granulocytes: 1 %
Lymphocytes Relative: 31 %
Lymphs Abs: 3.5 10*3/uL (ref 0.7–4.0)
MCH: 22.2 pg — ABNORMAL LOW (ref 26.0–34.0)
MCHC: 32.5 g/dL (ref 30.0–36.0)
MCV: 68.4 fL — ABNORMAL LOW (ref 80.0–100.0)
Monocytes Absolute: 1 10*3/uL (ref 0.1–1.0)
Monocytes Relative: 9 %
Neutro Abs: 6.4 10*3/uL (ref 1.7–7.7)
Neutrophils Relative %: 56 %
Platelets: 264 10*3/uL (ref 150–400)
RBC: 4.68 MIL/uL (ref 3.87–5.11)
RDW: 17.7 % — ABNORMAL HIGH (ref 11.5–15.5)
WBC: 11.3 10*3/uL — ABNORMAL HIGH (ref 4.0–10.5)
nRBC: 0.9 % — ABNORMAL HIGH (ref 0.0–0.2)

## 2020-07-08 LAB — COMPREHENSIVE METABOLIC PANEL
ALT: 15 U/L (ref 0–44)
AST: 16 U/L (ref 15–41)
Albumin: 4.4 g/dL (ref 3.5–5.0)
Alkaline Phosphatase: 64 U/L (ref 38–126)
Anion gap: 8 (ref 5–15)
BUN: 15 mg/dL (ref 8–23)
CO2: 22 mmol/L (ref 22–32)
Calcium: 9 mg/dL (ref 8.9–10.3)
Chloride: 106 mmol/L (ref 98–111)
Creatinine, Ser: 0.9 mg/dL (ref 0.44–1.00)
GFR, Estimated: >60 mL/min (ref >60)
Glucose, Bld: 100 mg/dL — ABNORMAL HIGH (ref 70–99)
Potassium: 4.1 mmol/L (ref 3.5–5.1)
Sodium: 136 mmol/L (ref 135–145)
Total Bilirubin: 0.8 mg/dL (ref 0.3–1.2)
Total Protein: 6.8 g/dL (ref 6.5–8.1)

## 2020-07-08 LAB — IRON AND TIBC
Iron: 87 ug/dL (ref 28–170)
Saturation Ratios: 27 % (ref 10.4–31.8)
TIBC: 325 ug/dL (ref 250–450)
UIBC: 238 ug/dL

## 2020-07-08 LAB — FERRITIN: Ferritin: 205 ng/mL (ref 11–307)

## 2020-07-08 LAB — VITAMIN B12: Vitamin B-12: 374 pg/mL (ref 180–914)

## 2020-07-11 ENCOUNTER — Ambulatory Visit: Payer: BC Managed Care – PPO | Admitting: Physical Therapy

## 2020-07-15 ENCOUNTER — Inpatient Hospital Stay (HOSPITAL_BASED_OUTPATIENT_CLINIC_OR_DEPARTMENT_OTHER): Payer: BC Managed Care – PPO | Admitting: Internal Medicine

## 2020-07-15 ENCOUNTER — Other Ambulatory Visit: Payer: Self-pay

## 2020-07-15 ENCOUNTER — Inpatient Hospital Stay: Payer: BC Managed Care – PPO

## 2020-07-15 ENCOUNTER — Encounter: Payer: Self-pay | Admitting: Internal Medicine

## 2020-07-15 DIAGNOSIS — R5383 Other fatigue: Secondary | ICD-10-CM | POA: Diagnosis not present

## 2020-07-15 DIAGNOSIS — Z79899 Other long term (current) drug therapy: Secondary | ICD-10-CM | POA: Diagnosis not present

## 2020-07-15 DIAGNOSIS — F17210 Nicotine dependence, cigarettes, uncomplicated: Secondary | ICD-10-CM | POA: Diagnosis not present

## 2020-07-15 DIAGNOSIS — E538 Deficiency of other specified B group vitamins: Secondary | ICD-10-CM | POA: Diagnosis not present

## 2020-07-15 DIAGNOSIS — I447 Left bundle-branch block, unspecified: Secondary | ICD-10-CM | POA: Diagnosis not present

## 2020-07-15 DIAGNOSIS — Z7982 Long term (current) use of aspirin: Secondary | ICD-10-CM | POA: Diagnosis not present

## 2020-07-15 DIAGNOSIS — M129 Arthropathy, unspecified: Secondary | ICD-10-CM | POA: Diagnosis not present

## 2020-07-15 DIAGNOSIS — D563 Thalassemia minor: Secondary | ICD-10-CM

## 2020-07-15 DIAGNOSIS — D561 Beta thalassemia: Secondary | ICD-10-CM | POA: Diagnosis not present

## 2020-07-15 DIAGNOSIS — R531 Weakness: Secondary | ICD-10-CM | POA: Diagnosis not present

## 2020-07-15 DIAGNOSIS — Z87442 Personal history of urinary calculi: Secondary | ICD-10-CM | POA: Diagnosis not present

## 2020-07-15 DIAGNOSIS — N183 Chronic kidney disease, stage 3 unspecified: Secondary | ICD-10-CM | POA: Diagnosis not present

## 2020-07-15 DIAGNOSIS — E079 Disorder of thyroid, unspecified: Secondary | ICD-10-CM | POA: Diagnosis not present

## 2020-07-15 DIAGNOSIS — Z803 Family history of malignant neoplasm of breast: Secondary | ICD-10-CM | POA: Diagnosis not present

## 2020-07-15 NOTE — Assessment & Plan Note (Signed)
#  Chronic microcytic anemia/history of beta thalassemia - out of proportion to her anemia. S/p IV venoferx4 in fall of 2017.  #Today hemoglobin is 10.4 saturation 27% ferritin 230 [slightly lower compared to previous]; HOLD Venofer. Continue dietary iron.   # B12 deficiency - on PO B12.   *ret # DISPOSITION: # HOLD Venofer today # follow up 6 months-MD-labs-cbc/bmp/iron studies/ferritin/B12- 1 week prior; venofer IV possible- Dr.B.   Cc; Dr.Tullo.

## 2020-07-15 NOTE — Progress Notes (Signed)
[facility] CONSULT NOTE  Patient Care Team: Crecencio Mc, MD as PCP - General (Internal Medicine) Bary Castilla Forest Gleason, MD as Consulting Physician (General Surgery)  CHIEF COMPLAINTS/PURPOSE OF CONSULTATION: Anemia.  HEMATOLOGY HISTORY  # CHRONIC MICROCYTIC ANEMIA- BETA THALASSEMIA MINOR [colo- 2016; Dr. Dondra Prader July 2017- IV trial of Venofer x4   # Lung nodules [CT- sub cm Jan 2017]-2019 CT scan stable.  Benign.  HISTORY OF PRESENTING ILLNESS:  Cheryl [name] 65 y.o.  female with a history of beta thalassemia minor; also iron deficiency anemia-is here for follow-up.  Patient denies any blood in stools or black or stools. Energy levels are good. She is looking forward to retire.    Review of Systems  Constitutional: Positive for malaise/fatigue. Negative for chills, diaphoresis, fever and weight loss.  HENT: Negative for nosebleeds and sore throat.   Eyes: Negative for double vision.  Respiratory: Positive for shortness of breath. Negative for cough, hemoptysis, sputum production and wheezing.   Cardiovascular: Negative for chest pain, palpitations, orthopnea and leg swelling.  Gastrointestinal: Negative for abdominal pain, blood in stool, constipation, diarrhea, heartburn, melena, nausea and vomiting.  Genitourinary: Negative for dysuria, frequency and urgency.  Musculoskeletal: Negative for back pain and joint pain.  Skin: Negative.  Negative for itching and rash.  Neurological: Negative for dizziness, tingling, focal weakness, weakness and headaches.  Endo/Heme/Allergies: Does not bruise/bleed easily.  Psychiatric/Behavioral: Negative for depression. The patient is not nervous/anxious and does not have insomnia.      MEDICAL HISTORY:  Past Medical History:  Diagnosis Date  . 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  Arrhythmia    left bundle branch block  . Arthritis   . Blood in stool   . Chicken pox   . Generalized headaches   . History of blood transfusion   . Kidney disease, chronic, stage III (GFR 30-59 ml/min) (HCC)   . LBBB (left bundle branch block)   . Renal disorder   . Thyroid disease   . UTI (lower urinary tract infection)     SURGICAL HISTORY: Past Surgical History:  Procedure Laterality Date  . ABDOMINAL HYSTERECTOMY  1997  . APPENDECTOMY  1981  . BREAST EXCISIONAL BIOPSY Bilateral 1976   neg  . BREAST SURGERY  1976  . CARDIAC CATHETERIZATION  2004   [facility]  . CARDIAC CATHETERIZATION  2006   [facility]  . cystic fibrosis tumor removal  1983  . THUMB AMPUTATION  1992   traumatic  . TONSILLECTOMY AND ADENOIDECTOMY  1964    SOCIAL HISTORY: Social History   Socioeconomic History  . Marital status: Married    Spouse name: Not on file  . Number of children: Not on file  . Years of education: Not on file  . Highest education level: Not on file  Occupational History  . Not on file  Tobacco Use  . Smoking status: Current Every Day Smoker    Packs/day: 0.25    Years: 34.00    Pack years: 8.50    Types: Cigarettes    Last attempt to quit: 04/10/2013    Years since quitting: 7.2  . Smokeless tobacco: Never Used  Substance and Sexual Activity  . Alcohol use: Yes    Comment: occasional  . Drug use: No  . Sexual activity: Not on file  Other Topics Concern  . Not on file  Social  History Narrative   Married to [employer]; Works for [employer], Agricultural engineer. Quit smoking 2018; ocassional alcohol; lives near to [place].    Social Determinants of Health   Financial Resource Strain: Not on file  Food Insecurity: Not on file  Transportation Needs: Not on file  Physical Activity: Not on file  Stress: Not on file  Social Connections: Not on file  Intimate Partner Violence: Not on file    FAMILY HISTORY: Family History  Problem Relation Age of Onset  . Heart  disease Mother   . Arthritis Mother   . Cancer Mother        breast  . Hyperlipidemia Mother   . Hypertension Mother   . Heart attack Mother   . Breast cancer Mother 41  . Heart disease Father   . Diabetes Father   . Arthritis Father   . Hypertension Father   . Parkinson's disease Father   . Hodgkin's lymphoma Father        hodgkins disease, prostate  . Heart disease Sister   . Diabetes Sister   . Heart disease Brother 57       ami x 8,  4 vessel CABG   . Diabetes Brother   . Liver disease Brother   . Lung disease Brother   . Heart disease Maternal Grandmother   . Diabetes Maternal Grandmother   . Heart disease Maternal Grandfather   . Heart disease Paternal Grandmother   . Diabetes Paternal Grandmother   . Stroke Paternal Grandfather   . Heart disease Paternal Grandfather   . Diabetes Paternal Grandfather   . Diabetes Maternal Aunt     ALLERGIES:  is allergic to peanuts [peanut oil], penicillins, sulfa antibiotics, influenza vac split [influenza virus vaccine], mobic [meloxicam], and nickel.  MEDICATIONS:  Current Outpatient Medications  Medication Sig Dispense Refill  . albuterol (PROVENTIL HFA;VENTOLIN HFA) 108 (90 Base) MCG/ACT inhaler Inhale 2 puffs into the lungs every 6 (six) hours as needed for wheezing or shortness of breath. 1 Inhaler 0  . aspirin EC 81 MG tablet Take 1 tablet (81 mg total) by mouth daily.    [name] buPROPion (WELLBUTRIN SR) 100 MG 12 hr tablet TAKE 1 TABLET BY MOUTH  TWICE DAILY 180 tablet 3  . Calcium Carbonate-Vit D-Min (CALCIUM 1200 PO) Take 1 tablet by mouth daily.    [name] docusate sodium (COLACE) 100 MG capsule Take 100 mg by mouth daily as needed for mild constipation.    . gabapentin (NEURONTIN) 300 MG capsule TAKE 1 CAPSULE BY MOUTH 3  TIMES DAILY 270 capsule 3  . HYDROcodone-acetaminophen (NORCO/VICODIN) 5-325 MG tablet Take 2 tablets by mouth daily as needed. 60 tablet 0  . HYDROcodone-acetaminophen (NORCO/VICODIN) 5-325 MG tablet Take 1  tablet by mouth 2 (two) times daily as needed. 60 tablet 0  . HYDROcodone-acetaminophen (NORCO/VICODIN) 5-325 MG tablet Take 2 tablets by mouth daily as needed. 60 tablet 0  . metoprolol succinate (TOPROL-XL) 25 MG 24 hr tablet TAKE 1 TABLET BY MOUTH  DAILY 90 tablet 3  . nitroGLYCERIN (NITROSTAT) 0.4 MG SL tablet DISSOLVE 1 TABLET UNDER THE TONGUE EVERY 5 MINUTES AS  NEEDED FOR CHEST PAIN. MAX  OF 3 TABLETS IN 15 MINUTES. CALL 911 IF PAIN PERSISTS. 100 tablet 3  . progesterone (PROMETRIUM) 200 MG capsule Take 200 mg by mouth at bedtime.     . progesterone (PROMETRIUM) 200 MG capsule Take 200 mg by mouth at bedtime.    . propranolol (INDERAL) 10 MG tablet TAKE  1 TABLET BY MOUTH 3  TIMES DAILY AS NEEDED 180 tablet 5  . rOPINIRole (REQUIP) 0.5 MG tablet TAKE 1 TABLET(0.5 MG) BY MOUTH THREE TIMES DAILY 90 tablet 1  . rosuvastatin (CRESTOR) 10 MG tablet TAKE 1 TABLET BY MOUTH  DAILY 90 tablet 3  . traMADol (ULTRAM) 50 MG tablet Take 1 tablet (50 mg total) by mouth 2 (two) times daily as needed. for pain 60 tablet 5  . venlafaxine XR (EFFEXOR-XR) 75 MG 24 hr capsule TAKE 1 CAPSULE(75 MG) BY MOUTH DAILY WITH BREAKFAST 90 capsule 3  . vitamin C (ASCORBIC ACID) 500 MG tablet Take 500 mg by mouth daily.     No current facility-administered medications for this visit.      [name]  PHYSICAL EXAMINATION:   Vitals:   07/15/20 1259  BP: 124/76  Pulse: 83  Resp: 16  Temp: 97.7 F (36.5 C)  SpO2: 98%   Filed Weights   07/15/20 1259  Weight: 166 lb 3.2 oz (75.4 kg)   Physical Exam HENT:     Head: Normocephalic and atraumatic.     Mouth/Throat:     Pharynx: No oropharyngeal exudate.  Eyes:     Pupils: Pupils are equal, round, and reactive to light.  Cardiovascular:     Rate and Rhythm: Normal rate and regular rhythm.  Pulmonary:     Effort: Pulmonary effort is normal. No respiratory distress.     Breath sounds: Normal breath sounds. No wheezing.  Abdominal:     General: Bowel sounds are  normal. There is no distension.     Palpations: Abdomen is soft. There is no mass.     Tenderness: There is no abdominal tenderness. There is no guarding or rebound.  Musculoskeletal:        General: No tenderness. Normal range of motion.     Cervical back: Normal range of motion and neck supple.  Skin:    General: Skin is warm.  Neurological:     Mental Status: She is alert and oriented to person, place, and time.  Psychiatric:        Mood and Affect: Affect normal.      LABORATORY DATA:  I have reviewed the data as listed Lab Results  Component Value Date   WBC 11.3 (H) 07/08/2020   HGB 10.4 (L) 07/08/2020   HCT 32.0 (L) 07/08/2020   MCV 68.4 (L) 07/08/2020   PLT 264 07/08/2020   Recent Labs    11/25/19 1232 01/11/20 1407 05/23/20 1147 07/08/20 1415  NA 140 140 139 136  K 4.8 3.9 5.0 4.1  CL 106 111 105 106  CO2 '22 24 20 22  ' GLUCOSE 94 106* 89 100*  BUN '14 12 11 15  ' CREATININE 1.07* 0.98 1.02* 0.90  CALCIUM 9.2 8.6* 9.0 9.0  GFRNONAA 55* >60 58* >60  GFRAA 63 >60 67  --   PROT 6.2  --  6.5 6.8  ALBUMIN 4.5  --  4.3 4.4  AST 15  --  12 16  ALT 12  --  13 15  ALKPHOS 92  --  81 64  BILITOT 0.5  --  0.6 0.8     No results found.  ASSESSMENT & PLAN:   Thalassemia minor # Chronic microcytic anemia/history of beta thalassemia - out of proportion to her anemia. S/p IV venoferx4 in fall of 2017.  #Today hemoglobin is 10.4 saturation 27% ferritin 230 [slightly lower compared to previous]; HOLD Venofer. Continue dietary iron.   #  B12 deficiency - on PO B12.   *ret # DISPOSITION: # HOLD Venofer today # follow up 6 months-MD-labs-cbc/bmp/iron studies/ferritin/B12- 1 week prior; venofer IV possible- Dr.B.   Cc; Dr.Tullo.    Cammie Sickle, MD 07/15/2020 1:25 PM

## 2020-07-18 ENCOUNTER — Other Ambulatory Visit: Payer: Self-pay

## 2020-07-18 ENCOUNTER — Ambulatory Visit: Payer: BC Managed Care – PPO | Admitting: Physical Therapy

## 2020-07-18 ENCOUNTER — Encounter: Payer: Self-pay | Admitting: Physical Therapy

## 2020-07-18 DIAGNOSIS — M25552 Pain in left hip: Secondary | ICD-10-CM

## 2020-07-18 DIAGNOSIS — R293 Abnormal posture: Secondary | ICD-10-CM | POA: Diagnosis not present

## 2020-07-18 DIAGNOSIS — M545 Low back pain, unspecified: Secondary | ICD-10-CM | POA: Diagnosis not present

## 2020-07-18 DIAGNOSIS — G8929 Other chronic pain: Secondary | ICD-10-CM | POA: Diagnosis not present

## 2020-07-18 NOTE — Therapy (Signed)
[name] [address]. [place], [place], [address] Phone: [phone]   Fax:  [phone]  Physical Therapy Treatment  Patient Details  Name: Cheryl [name] MRN: [id number] Date of Birth: 1956-03-21 Referring Provider (PT): Landry Mellow, Mervyn Skeeters   Encounter Date: 07/18/2020   PT End of Session - 07/18/20 1613    Visit Number 5    Number of Visits 6    Date for PT Re-Evaluation 07/18/20    PT Start Time 1600    PT Stop Time 1655    PT Time Calculation (min) 55 min    Activity Tolerance Patient tolerated treatment well    Behavior During Therapy [facility] for tasks assessed/performed           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   Arrhythmia    left bundle branch block   Arthritis    Blood in stool    Chicken pox    Generalized headaches    History of blood transfusion    Kidney disease, chronic, stage III (GFR 30-59 ml/min) (HCC)    LBBB (left bundle branch block)    Renal disorder    Thyroid disease    UTI (lower urinary tract infection)     Past Surgical History:  Procedure Laterality Date   ABDOMINAL HYSTERECTOMY  1997   APPENDECTOMY  1981   BREAST EXCISIONAL BIOPSY Bilateral 1976   neg   BREAST SURGERY  1976   CARDIAC CATHETERIZATION  2004   [facility]   CARDIAC CATHETERIZATION  2006   [facility]   cystic fibrosis tumor removal  1983   THUMB AMPUTATION  1992   traumatic   TONSILLECTOMY AND ADENOIDECTOMY  1964    There were no vitals filed for this visit.   Subjective Assessment - 07/18/20 1602    Subjective Patient arrives to clinic with reports of increased back pain. Patient notes that she had 7/10 pain at cancer center from riding in the car; she notes when she rides in her car (as opposed as to truck) she feels increased  pressure around the hips. Patient was able to do yard work and weedeat yesterday without any increased buttock pain but did have some increased back pain.    Currently in Pain? Yes    Pain Score 2     Pain Location Buttocks    Pain Orientation Left          TREATMENT  Manual Therapy: STM and TPR performed to B glute complexes and B lumbar paraspinals to allow for decreased tension and pain and improved posture and function L sacrotuberous and sacroiliac ligament STM  Neuromuscular Re-education: Reviewed HEP.  Prone diaphragmatic breathing for nervous system downtraining. Prone hip extension/multifidi motor control, 2x5 each leg with TCs for improved relaxation of mm between reps  Prone heel squeezes for improved x20 for improved stabilization of SIJ as well as upper glute activation Prone prop sustained 2x 1 min for improved lumbar extension and decreased pain Child's pose knees wide for improved postural awareness and relaxation   Patient educated throughout session on appropriate technique and form using multi-modal cueing, HEP, and activity modification. Patient articulated understanding and returned demonstration.  Patient Response to interventions: Comfortable to follow up in 1 week regarding return to gym program  ASSESSMENT Patient presents to clinic with excellent motivation to  participate in therapy. Patient demonstrates deficits in LLE function, LLE pain, scar mobility, posture. Patient able to tolerate increased lumbar extension and paraspinal activation during today's session and responded positively to manual interventions. Patient will benefit from continued skilled therapeutic intervention to address remaining deficits in LLE function, LLE pain, scar mobility, posture in order to increase function and improve overall QOL.     PT Long Term Goals - 06/06/20 1750      PT LONG TERM GOAL #1   Title Patient will demonstrate independence with HEP in order to maximize  therapeutic [name] and improve carryover from physical therapy sessions to ADLs in the home and community.    Baseline IE: not demonstrated    Time 6    Period Weeks    Status New    Target Date 07/18/20      PT LONG TERM GOAL #2   Title Patient will decrease worst pain as reported on NPRS by at least 2 points to demonstrate clinically significant reduction in pain in order to restore/improve function and overall QOL.    Baseline IE: 8/10    Time 6    Period Weeks    Status New    Target Date 07/18/20      PT LONG TERM GOAL #3   Title Patient will report being able to return to activities including, but not limited to: gym-based fitness program and gardening without pain or limitation to indicate complete resolution of the chief complaint and return to prior level of participation at home and in the community.    Baseline IE: unable    Time 6    Period Weeks    Status New    Target Date 07/18/20      PT LONG TERM GOAL #4   Title Patient will demonstrate improved function as evidenced by a score of 61 on FOTO measure for full participation in activities at home and in the community.    Baseline IE: 51    Time 6    Period Weeks    Status New    Target Date 07/18/20                 Plan - 07/18/20 1615    Clinical Impression Statement Patient presents to clinic with excellent motivation to participate in therapy. Patient demonstrates deficits in LLE function, LLE pain, scar mobility, posture. Patient able to tolerate increased lumbar extension and paraspinal activation during today's session and responded positively to manual interventions. Patient will benefit from continued skilled therapeutic intervention to address remaining deficits in LLE function, LLE pain, scar mobility, posture in order to increase function and improve overall QOL.    Personal Factors and Comorbidities Age;Behavior Pattern;Comorbidity 3+;Past/Current Experience;Time since onset of  injury/illness/exacerbation;Profession    Comorbidities ANA positive with polyarthralgia, HTN, CKD, hx of abdominal hysterectomy, thyroid disease, IBS, OSA, anxiety, diverticulosis, dyslipidemia, PAD,    Examination-Activity Limitations Sit;Lift;Stairs    Examination-Participation Restrictions Occupation;Driving    Stability/Clinical Decision Making Evolving/Moderate complexity    Rehab Potential Fair    PT Frequency 1x / week    PT Duration 6 weeks    PT Treatment/Interventions Aquatic Therapy;ADLs/Self Care Home Management;Electrical Stimulation;Moist Heat;Cryotherapy;Iontophoresis 4mg /ml Dexamethasone;Dry needling;Taping;Scar mobilization;Joint Manipulations;Spinal Manipulations;Manual techniques;Patient/family education;Neuromuscular re-education;Balance training;Therapeutic exercise;Therapeutic activities;Gait training;DME Instruction    PT Next Visit Plan gym program    PT Home Exercise Plan scar massage    Consulted and Agree with Plan of Care Patient  Patient will benefit from skilled therapeutic intervention in order to improve the following deficits and impairments:  Abnormal gait,Decreased balance,Pain,Postural dysfunction,Improper body mechanics,Impaired flexibility,Decreased strength,Decreased coordination  Visit Diagnosis: Pain in left hip  Chronic left-sided low back pain, unspecified whether sciatica present  Abnormal posture     Problem List Patient Active Problem List   Diagnosis Date Noted   Beta thalassemia (HCC) 05/24/2020   Proximal leg weakness 05/24/2020   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   Orthostatic hypotension 11/25/2019   Hospital discharge follow-up 02/15/2019   Chest pain 01/29/2019   Restless legs 11/09/2018   Educated about COVID-19 virus infection 11/09/2018   Osteoarthritis of both shoulders 08/03/2018   Cervical radiculopathy 12/30/2017   Carotid artery stenosis 09/09/2017   Nonallopathic lesion of cervical  region 08/13/2017   Nonallopathic lesion of thoracic region 08/13/2017   Nonallopathic lesion of lumbosacral region 08/13/2017   Chronic renal insufficiency 04/03/2017   Renal hypertension 12/16/2016   Diverticulosis of colon 09/02/2016   PAD (peripheral artery disease) (HCC) 08/22/2016   Renal artery stenosis (HCC) 08/22/2016   Iron deficiency anemia 11/25/2015   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   ANA positive 06/21/2015   Chronic neck pain 06/21/2015   Arthralgia 05/19/2015   Dyslipidemia (high LDL; low HDL) 04/16/2015   Palpitations 03/28/2015   Pancreatic cyst 10/24/2014   Multiple lung nodules 10/24/2014   Hyperlipidemia 10/20/2014   Chronic lower back pain 09/18/2013   Screening for osteoporosis 08/20/2013   Routine general medical examination at a health care facility 10/26/2012   Anxiety 08/22/2012   Irritable bowel syndrome 08/17/2012   History of tobacco abuse 08/17/2012   Hearing loss d/t noise 08/17/2012   Diverticulosis 08/17/2012   OSA (obstructive sleep apnea) 08/17/2012   Hematuria, microscopic 08/16/2012   Thalassemia minor 08/16/2012    Sheria Lang PT, DPT [phone] 07/18/2020, 6:03 PM  [facility] [address]. [place], [place], [address] Phone: [phone]   Fax:  [phone]  Name: Cheryl [name] MRN: [id number] Date of Birth: 05/01/1956

## 2020-07-25 ENCOUNTER — Ambulatory Visit: Payer: BC Managed Care – PPO | Admitting: Physical Therapy

## 2020-07-25 ENCOUNTER — Other Ambulatory Visit: Payer: Self-pay

## 2020-07-25 ENCOUNTER — Encounter: Payer: Self-pay | Admitting: Physical Therapy

## 2020-07-25 DIAGNOSIS — M25552 Pain in left hip: Secondary | ICD-10-CM | POA: Diagnosis not present

## 2020-07-25 DIAGNOSIS — M545 Low back pain, unspecified: Secondary | ICD-10-CM | POA: Diagnosis not present

## 2020-07-25 DIAGNOSIS — R293 Abnormal posture: Secondary | ICD-10-CM

## 2020-07-25 DIAGNOSIS — G8929 Other chronic pain: Secondary | ICD-10-CM | POA: Diagnosis not present

## 2020-07-25 NOTE — Therapy (Signed)
[facility] [address] [place], [place], [address] Phone: [phone]   Fax:  [phone]  Physical Therapy Treatment  Patient Details  Name: Cheryl [name] MRN: [id number] Date of Birth: 1956-02-18 Referring Provider (PT): Richard Miu   Encounter Date: 07/25/2020   PT End of Session - 07/25/20 1627    Visit Number 6    Number of Visits 6    Date for PT Re-Evaluation 07/25/20    PT Start Time 1600    PT Stop Time 1655    PT Time Calculation (min) 55 min    Activity Tolerance Patient tolerated treatment well    Behavior During Therapy [facility] for tasks assessed/performed           Past Medical History:  Diagnosis Date  . 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 Arrhythmia    left bundle branch block  . Arthritis   . Blood in stool   . Chicken pox   . Generalized headaches   . History of blood transfusion   . Kidney disease, chronic, stage III (GFR 30-59 ml/min) (HCC)   . LBBB (left bundle branch block)   . Renal disorder   . Thyroid disease   . UTI (lower urinary tract infection)     Past Surgical History:  Procedure Laterality Date  . ABDOMINAL HYSTERECTOMY  1997  . APPENDECTOMY  1981  . BREAST EXCISIONAL BIOPSY Bilateral 1976   neg  . BREAST SURGERY  1976  . CARDIAC CATHETERIZATION  2004   [facility]  . CARDIAC CATHETERIZATION  2006   [facility]  . cystic fibrosis tumor removal  1983  . THUMB AMPUTATION  1992   traumatic  . TONSILLECTOMY AND ADENOIDECTOMY  1964    There were no vitals filed for this visit.   Subjective Assessment - 07/25/20 1608    Subjective Patient presents to clinic with continued improvement. Patient notes that she is ready for a gym based program.    Currently in Pain? No/denies           TREATMENT Neuromuscular Re-education: Reassessed goals; see  below.  Reviewed gym based program:  Knee Extension with Weight Machine - 4 sets - 8 reps  Hamstring Curl with Weight Machine - 4 sets - 8 reps  Full Leg Press - 4 sets - 8 reps  Shoulder Press with Weight Machine - 4 sets - 8 reps  Pec Fly Machine - 4 sets - 8 reps  Standing Cable Lifts - 4 sets - 8 reps  Standing Cable Chops - 4 sets - 8 reps  Patient educated throughout session on appropriate technique and form using multi-modal cueing, HEP, and activity modification. Patient articulated understanding and returned demonstration.  Patient Response to interventions: Happy to discharge  ASSESSMENT Patient presents to clinic with excellent motivation to participate in therapy. Patient demonstrates deficits in LLE function, LLE pain, scar mobility, posture. Patient indicating achievement of all goals set forth in therapy and is appropriate to discharge to self-management of LLE function, LLE pain, scar mobility, posture.    PT Long Term Goals - 07/25/20 1628      PT LONG TERM GOAL #1   Title Patient will demonstrate independence with HEP in order to maximize therapeutic gains and improve carryover from physical therapy sessions to ADLs in the home and community.    Baseline IE: not  demonstrated; 2/28: IND    Time 6    Period Weeks    Status Achieved      PT LONG TERM GOAL #2   Title Patient will decrease worst pain as reported on NPRS by at least 2 points to demonstrate clinically significant reduction in pain in order to restore/improve function and overall QOL.    Baseline IE: 8/10; 2/28: 1/10    Time 6    Period Weeks    Status Achieved      PT LONG TERM GOAL #3   Title Patient will report being able to return to activities including, but not limited to: gym-based fitness program and gardening without pain or limitation to indicate complete resolution of the chief complaint and return to prior level of participation at home and in the community.    Baseline IE: unable; 2/28: no  reservations about returning with graded re-entry    Time 6    Period Weeks    Status Achieved      PT LONG TERM GOAL #4   Title Patient will demonstrate improved function as evidenced by a score of 61 on FOTO measure for full participation in activities at home and in the community.    Baseline IE: 51; 2/7: 67; 2/28: 94    Time 6    Period Weeks    Status Achieved                 Plan - 07/25/20 1628    Clinical Impression Statement Patient presents to clinic with excellent motivation to participate in therapy. Patient demonstrates deficits in LLE function, LLE pain, scar mobility, posture. Patient indicating achievement of all goals set forth in therapy and is appropriate to discharge to self-management of LLE function, LLE pain, scar mobility, posture.    Personal Factors and Comorbidities Age;Behavior Pattern;Comorbidity 3+;Past/Current Experience;Time since onset of injury/illness/exacerbation;Profession    Comorbidities ANA positive with polyarthralgia, HTN, CKD, hx of abdominal hysterectomy, thyroid disease, IBS, OSA, anxiety, diverticulosis, dyslipidemia, PAD,    Examination-Activity Limitations Sit;Lift;Stairs    Examination-Participation Restrictions Occupation;Driving    Stability/Clinical Decision Making Evolving/Moderate complexity    Rehab Potential Fair    PT Frequency One time visit    PT Duration --    PT Treatment/Interventions Aquatic Therapy;ADLs/Self Care Home Management;Electrical Stimulation;Moist Heat;Cryotherapy;Iontophoresis 4mg /ml Dexamethasone;Dry needling;Taping;Scar mobilization;Joint Manipulations;Spinal Manipulations;Manual techniques;Patient/family education;Neuromuscular re-education;Balance training;Therapeutic exercise;Therapeutic activities;Gait training;DME Instruction    PT Next Visit Plan --    PT Home Exercise Plan --    Consulted and Agree with Plan of Care Patient           Patient will benefit from skilled therapeutic intervention in  order to improve the following deficits and impairments:  Abnormal gait,Decreased balance,Pain,Postural dysfunction,Improper body mechanics,Impaired flexibility,Decreased strength,Decreased coordination  Visit Diagnosis: Pain in left hip  Chronic left-sided low back pain, unspecified whether sciatica present  Abnormal posture     Problem List Patient Active Problem List   Diagnosis Date Noted  . Beta thalassemia (HCC) 05/24/2020  . Proximal leg weakness 05/24/2020  . 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 Orthostatic hypotension 11/25/2019  . Hospital discharge follow-up 02/15/2019  . Chest pain 01/29/2019  . Restless legs 11/09/2018  . Educated about COVID-19 virus infection 11/09/2018  . Osteoarthritis of both shoulders 08/03/2018  . Cervical radiculopathy 12/30/2017  . Carotid artery stenosis 09/09/2017  . Nonallopathic lesion of cervical region 08/13/2017  . Nonallopathic lesion of thoracic region 08/13/2017  . Nonallopathic lesion of lumbosacral region 08/13/2017  .  Chronic renal insufficiency 04/03/2017  . Renal hypertension 12/16/2016  . Diverticulosis of colon 09/02/2016  . PAD (peripheral artery disease) (HCC) 08/22/2016  . Renal artery stenosis (HCC) 08/22/2016  . Iron deficiency anemia 11/25/2015  . 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 ANA positive 06/21/2015  . Chronic neck pain 06/21/2015  . Arthralgia 05/19/2015  . Dyslipidemia (high LDL; low HDL) 04/16/2015  . Palpitations 03/28/2015  . Pancreatic cyst 10/24/2014  . Multiple lung nodules 10/24/2014  . Hyperlipidemia 10/20/2014  . Chronic lower back pain 09/18/2013  . Screening for osteoporosis 08/20/2013  . Routine general medical examination at a health care facility 10/26/2012  . Anxiety 08/22/2012  . Irritable bowel syndrome 08/17/2012  . History of tobacco abuse 08/17/2012  . Hearing loss d/t noise 08/17/2012  . Diverticulosis 08/17/2012  . OSA (obstructive sleep apnea) 08/17/2012  .  Hematuria, microscopic 08/16/2012  . Thalassemia minor 08/16/2012   Sheria Lang PT, DPT [phone]  07/25/2020, 6:22 PM  [facility] [address] [place], [place], [address] Phone: [phone]   Fax:  [phone]  Name: Kenyette Gundy MRN: [id number] Date of Birth: [birth year]

## 2020-07-26 ENCOUNTER — Encounter: Payer: Self-pay | Admitting: Internal Medicine

## 2020-07-26 ENCOUNTER — Ambulatory Visit: Payer: BC Managed Care – PPO | Admitting: Internal Medicine

## 2020-07-26 ENCOUNTER — Other Ambulatory Visit: Payer: Self-pay

## 2020-07-26 VITALS — BP 130/68 | HR 89 | Temp 98.1°F | Ht 63.0 in | Wt 166.5 lb

## 2020-07-26 DIAGNOSIS — M545 Low back pain, unspecified: Secondary | ICD-10-CM | POA: Diagnosis not present

## 2020-07-26 DIAGNOSIS — M19012 Primary osteoarthritis, left shoulder: Secondary | ICD-10-CM

## 2020-07-26 DIAGNOSIS — Z720 Tobacco use: Secondary | ICD-10-CM

## 2020-07-26 DIAGNOSIS — M19011 Primary osteoarthritis, right shoulder: Secondary | ICD-10-CM

## 2020-07-26 DIAGNOSIS — I129 Hypertensive chronic kidney disease with stage 1 through stage 4 chronic kidney disease, or unspecified chronic kidney disease: Secondary | ICD-10-CM

## 2020-07-26 DIAGNOSIS — G8929 Other chronic pain: Secondary | ICD-10-CM

## 2020-07-26 DIAGNOSIS — Z23 Encounter for immunization: Secondary | ICD-10-CM

## 2020-07-26 MED ORDER — HYDROCODONE-ACETAMINOPHEN 5-325 MG PO TABS: 1.0000 | ORAL_TABLET | Freq: Two times a day (BID) | ORAL | 0 refills | Status: DC | PRN

## 2020-07-26 NOTE — Assessment & Plan Note (Addendum)
Well continued with  losartan . Cr and lytes unchanged  Lab Results  Component Value Date   CREATININE 0.90 07/08/2020   Lab Results  Component Value Date   NA 136 07/08/2020   K 4.1 07/08/2020   CL 106 07/08/2020   CO2 22 07/08/2020

## 2020-07-26 NOTE — Assessment & Plan Note (Signed)
pain was not controlled with Celebrex and it raised her blood pressure.  Continue use of vicodin TWO TIMES DAILY  and continue tramadol for  daytime use.   Refill history confirmed via [place] Controlled Substance databas, accessed by me today.  Refills  GIVEN FOR 2 months .

## 2020-07-26 NOTE — Progress Notes (Signed)
Subjective:  Patient ID: Cheryl [name], female    DOB: [birth year]  Age: 65 y.o. MRN: [id number]  CC: The primary encounter diagnosis was Need for Tdap vaccination. Diagnoses of Osteoarthritis of both shoulders, Tobacco abuse, Chronic low back pain without sciatica, unspecified back pain laterality, and Renal hypertension were also pertinent to this visit.  HPI Cheryl [name] presents for follow up o chronic low back pain  This visit occurred during the SARS-CoV-2 public health emergency.  Safety protocols were in place, including screening questions prior to the visit, additional usage of staff PPE, and extensive cleaning of exam room while observing appropriate contact time as indicated for disinfecting solutions.    Completed physical therapy; feels that the therapy helped significantly because she worked with Luther Parody, because she listened to patient who described the pain as a  Pulled buttock muscle, not a nerve. Patient was instructed to continue exercises and has been doing the at [employer] from [place] on Friday, planning a 6 to 8  Week vacation  in [place] to spend time with  children   Reviewed tobacco use,  No real plans to quit,  But thinks she will  Want fewer once she has retired because of the hobbies she will be doing full time   Chronic back pain:  Managed with hydrocodone Refill history confirmed via [place] Controlled Substance databas, accessed by me today..   Outpatient Medications Prior to Visit  Medication Sig Dispense Refill  . albuterol (PROVENTIL HFA;VENTOLIN HFA) 108 (90 Base) MCG/ACT inhaler Inhale 2 puffs into the lungs every 6 (six) hours as needed for wheezing or shortness of breath. 1 Inhaler 0  . aspirin EC 81 MG tablet Take 1 tablet (81 mg total) by mouth daily.    [name] buPROPion (WELLBUTRIN SR) 100 MG 12 hr tablet TAKE 1 TABLET BY MOUTH  TWICE DAILY 180 tablet 3  . Calcium Carbonate-Vit D-Min (CALCIUM 1200 PO) Take 1 tablet by mouth daily.     [name] docusate sodium (COLACE) 100 MG capsule Take 100 mg by mouth daily as needed for mild constipation.    . gabapentin (NEURONTIN) 300 MG capsule TAKE 1 CAPSULE BY MOUTH 3  TIMES DAILY 270 capsule 3  . metoprolol succinate (TOPROL-XL) 25 MG 24 hr tablet TAKE 1 TABLET BY MOUTH  DAILY 90 tablet 3  . nitroGLYCERIN (NITROSTAT) 0.4 MG SL tablet DISSOLVE 1 TABLET UNDER THE TONGUE EVERY 5 MINUTES AS  NEEDED FOR CHEST PAIN. MAX  OF 3 TABLETS IN 15 MINUTES. CALL 911 IF PAIN PERSISTS. 100 tablet 3  . progesterone (PROMETRIUM) 200 MG capsule Take 200 mg by mouth at bedtime.     . progesterone (PROMETRIUM) 200 MG capsule Take 200 mg by mouth at bedtime.    . propranolol (INDERAL) 10 MG tablet TAKE 1 TABLET BY MOUTH 3  TIMES DAILY AS NEEDED 180 tablet 5  . rOPINIRole (REQUIP) 0.5 MG tablet TAKE 1 TABLET(0.5 MG) BY MOUTH THREE TIMES DAILY 90 tablet 1  . rosuvastatin (CRESTOR) 10 MG tablet TAKE 1 TABLET BY MOUTH  DAILY 90 tablet 3  . traMADol (ULTRAM) 50 MG tablet Take 1 tablet (50 mg total) by mouth 2 (two) times daily as needed. for pain 60 tablet 5  . venlafaxine XR (EFFEXOR-XR) 75 MG 24 hr capsule TAKE 1 CAPSULE(75 MG) BY MOUTH DAILY WITH BREAKFAST 90 capsule 3  . vitamin C (ASCORBIC ACID) 500 MG tablet Take 500 mg by mouth daily.    [name] HYDROcodone-acetaminophen (  NORCO/VICODIN) 5-325 MG tablet Take 2 tablets by mouth daily as needed. 60 tablet 0  . HYDROcodone-acetaminophen (NORCO/VICODIN) 5-325 MG tablet Take 1 tablet by mouth 2 (two) times daily as needed. 60 tablet 0  . HYDROcodone-acetaminophen (NORCO/VICODIN) 5-325 MG tablet Take 2 tablets by mouth daily as needed. 60 tablet 0   No facility-administered medications prior to visit.    Review of Systems;  Patient denies headache, fevers, malaise, unintentional weight loss, skin rash, eye pain, sinus congestion and sinus pain, sore throat, dysphagia,  hemoptysis , cough, dyspnea, wheezing, chest pain, palpitations, orthopnea, edema, abdominal pain,  nausea, melena, diarrhea, constipation, flank pain, dysuria, hematuria, urinary  Frequency, nocturia, numbness, tingling, seizures,  Focal weakness, Loss of consciousness,  Tremor, insomnia, depression, anxiety, and suicidal ideation.      Objective:  BP 130/68   Pulse 89   Temp 98.1 F (36.7 C)   Ht 5\' 3"  (1.6 m)   Wt 166 lb 8 oz (75.5 kg)   SpO2 96%   BMI 29.49 kg/m   BP Readings from Last 3 Encounters:  07/26/20 130/68  07/15/20 124/76  05/23/20 138/64    Wt Readings from Last 3 Encounters:  07/26/20 166 lb 8 oz (75.5 kg)  07/15/20 166 lb 3.2 oz (75.4 kg)  05/23/20 163 lb 12.8 oz (74.3 kg)    General appearance: alert, cooperative and appears stated age Ears: normal TM's and external ear canals both ears Throat: lips, mucosa, and tongue normal; teeth and gums normal Neck: no adenopathy, no carotid bruit, supple, symmetrical, trachea midline and thyroid not enlarged, symmetric, no tenderness/mass/nodules Back: symmetric, no curvature. ROM normal. No CVA tenderness. Lungs: clear to auscultation bilaterally Heart: regular rate and rhythm, S1, S2 normal, no murmur, click, rub or gallop Abdomen: soft, non-tender; bowel sounds normal; no masses,  no organomegaly Pulses: 2+ and symmetric Skin: Skin color, texture, turgor normal. No rashes or lesions Lymph nodes: Cervical, supraclavicular, and axillary nodes normal.  Lab Results  Component Value Date   HGBA1C 5.1 10/26/2015   HGBA1C 5.4 11/25/2013    Lab Results  Component Value Date   CREATININE 0.90 07/08/2020   CREATININE 1.02 (H) 05/23/2020   CREATININE 0.98 01/11/2020    Lab Results  Component Value Date   WBC 11.3 (H) 07/08/2020   HGB 10.4 (L) 07/08/2020   HCT 32.0 (L) 07/08/2020   PLT 264 07/08/2020   GLUCOSE 100 (H) 07/08/2020   CHOL 107 01/30/2019   TRIG 164 (H) 01/30/2019   HDL 27 (L) 01/30/2019   LDLDIRECT 104 (H) 08/15/2012   LDLCALC 47 01/30/2019   ALT 15 07/08/2020   AST 16 07/08/2020   NA  136 07/08/2020   K 4.1 07/08/2020   CL 106 07/08/2020   CREATININE 0.90 07/08/2020   BUN 15 07/08/2020   CO2 22 07/08/2020   TSH 1.473 01/29/2019   INR 1.0 09/01/2014   HGBA1C 5.1 10/26/2015    MM 3D SCREEN BREAST BILATERAL  Result Date: 03/29/2020 CLINICAL DATA:  Screening. EXAM: DIGITAL SCREENING BILATERAL MAMMOGRAM WITH TOMO AND CAD COMPARISON:  Previous exam(s). ACR Breast Density Category c: The breast tissue is heterogeneously dense, which may obscure small masses. FINDINGS: There are no findings suspicious for malignancy. Images were processed with CAD. IMPRESSION: No mammographic evidence of malignancy. A result letter of this screening mammogram will be mailed directly to the patient. RECOMMENDATION: Screening mammogram in one year. (Code:SM-B-01Y) BI-RADS CATEGORY  1: Negative. Electronically Signed   By: 13/06/2019 III M.D  On: 03/29/2020 13:01    Assessment & Plan:   Problem List Items Addressed This Visit      Unprioritized   Tobacco abuse    Still smoking .  5 minutes was spent in face to face time discussing patient's current tobacco abuse,  Prior attempts to quit, the current and future hazards to patient's  health,  And assessing and discussing patient's  level of motivation to quit. Patient was encouraged to consider pharmacotherapy.        Chronic lower back pain    pain was not controlled with Celebrex and it raised her blood pressure.  Continue use of vicodin TWO TIMES DAILY  and continue tramadol for  daytime use.   Refill history confirmed via  Controlled Substance databas, accessed by me today.  Refills  GIVEN FOR 2 months .       Relevant Medications   HYDROcodone-acetaminophen (NORCO/VICODIN) 5-325 MG tablet   HYDROcodone-acetaminophen (NORCO/VICODIN) 5-325 MG tablet (Start on 08/25/2020)   Renal hypertension    Well continued with  losartan . Cr and lytes unchanged  Lab Results  Component Value Date   CREATININE 0.90 07/08/2020   Lab Results   Component Value Date   NA 136 07/08/2020   K 4.1 07/08/2020   CL 106 07/08/2020   CO2 22 07/08/2020         Osteoarthritis of both shoulders   Relevant Medications   HYDROcodone-acetaminophen (NORCO/VICODIN) 5-325 MG tablet   HYDROcodone-acetaminophen (NORCO/VICODIN) 5-325 MG tablet (Start on 08/25/2020)    Other Visit Diagnoses    Need for Tdap vaccination    -  Primary   Relevant Orders   Tdap vaccine greater than or equal to 7yo IM (Completed)      I am having Cheryl Deist A. Rack "[name]" maintain her Calcium Carbonate-Vit D-Min (CALCIUM 1200 PO), vitamin C, progesterone, albuterol, docusate sodium, aspirin EC, rosuvastatin, progesterone, buPROPion, nitroGLYCERIN, gabapentin, propranolol, venlafaxine XR, rOPINIRole, metoprolol succinate, traMADol, HYDROcodone-acetaminophen, and HYDROcodone-acetaminophen.  Meds ordered this encounter  Medications  . HYDROcodone-acetaminophen (NORCO/VICODIN) 5-325 MG tablet    Sig: Take 1 tablet by mouth 2 (two) times daily as needed.    Dispense:  60 tablet    Refill:  0  . HYDROcodone-acetaminophen (NORCO/VICODIN) 5-325 MG tablet    Sig: Take 1 tablet by mouth 2 (two) times daily as needed.    Dispense:  60 tablet    Refill:  0    Medications Discontinued During This Encounter  Medication Reason  . HYDROcodone-acetaminophen (NORCO/VICODIN) 5-325 MG tablet   . HYDROcodone-acetaminophen (NORCO/VICODIN) 5-325 MG tablet   . HYDROcodone-acetaminophen (NORCO/VICODIN) 5-325 MG tablet Reorder    Follow-up: No follow-ups on file.   Sherlene Shams, MD

## 2020-07-26 NOTE — Patient Instructions (Signed)
Tdap vaccine given today  Your March and April refills will be sent to your local pharmacy  Your May refill will be sent to whatever pharmacy in [place] you designate

## 2020-07-26 NOTE — Assessment & Plan Note (Signed)
?  Still smoking .  5 minutes was spent in face to face time discussing patient's current tobacco abuse,  Prior attempts to quit, the current and future hazards to patient's  health,  And assessing and discussing patient's  level of motivation to quit. Patient was encouraged to consider pharmacotherapy.  ?

## 2020-07-29 DIAGNOSIS — N951 Menopausal and female climacteric states: Secondary | ICD-10-CM | POA: Diagnosis not present

## 2020-08-10 ENCOUNTER — Other Ambulatory Visit: Payer: Self-pay

## 2020-08-10 ENCOUNTER — Encounter: Payer: Self-pay | Admitting: Cardiovascular Disease

## 2020-08-10 ENCOUNTER — Ambulatory Visit: Payer: BC Managed Care – PPO | Admitting: Cardiovascular Disease

## 2020-08-10 VITALS — BP 138/70 | HR 73 | Ht 63.0 in | Wt 166.0 lb

## 2020-08-10 DIAGNOSIS — I774 Celiac artery compression syndrome: Secondary | ICD-10-CM | POA: Diagnosis not present

## 2020-08-10 DIAGNOSIS — Z87891 Personal history of nicotine dependence: Secondary | ICD-10-CM | POA: Diagnosis not present

## 2020-08-10 DIAGNOSIS — E782 Mixed hyperlipidemia: Secondary | ICD-10-CM

## 2020-08-10 DIAGNOSIS — I771 Stricture of artery: Secondary | ICD-10-CM

## 2020-08-10 DIAGNOSIS — I739 Peripheral vascular disease, unspecified: Secondary | ICD-10-CM | POA: Diagnosis not present

## 2020-08-10 DIAGNOSIS — I700 Atherosclerosis of aorta: Secondary | ICD-10-CM

## 2020-08-10 NOTE — Progress Notes (Signed)
Date:  08/10/2020   ID:  [name], DOB [birth year], MRN [id number]  Patient Location:  [address] [place] [address]   Provider location:   [facility], [place] office  PCP:  Sherlene Shams, MD  Cardiologist:  Hubbard Robinson [facility]  Chief Complaint  Patient presents with  . Follow-up    Annual follow up. Medications verbally reviewed with patient.      History of Present Illness:    Cheryl [name] is a 65 y.o. female  past medical history of left bundle branch block,  41 year smoking history,  sleep apnea, has chronic fatigue, compliant with her CPAP Previous stress testing, cardiac catheterization at [facility] with reportedly no significant CAD at that time in 2003, additional episodes of palpitations and chest pain,  Prior episodes of chest pain,workup in June 2012 with stress testing/Myoview showing no ischemia  Moderate aortic atherosclerosis  Rare episodes of tachycardia, once per month. Prior chest pain symptoms felt to be atypical in nature. Strong family history of CAD who presents for follow-up of her palpitations, PAD.  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% Denies any GI issues  Labs: HGB 10.4 Followed by hematology Normal BMP  Has herniated disk, trouble sitting, Getting better  EKG personally reviewed by myself on todays visit NSR rate 73 bpm lbbb  Other past medical hx reviewed  myoview 01/30/2019 Pharmacological myocardial perfusion imaging study with no significant  Ischemia Fixed defect in the anteroseptal, apical region, likely attenuation artifact/bundle branch block, though unable  to exclude prior MI Anteroseptal wall hypokinesis, secondary to bundle branch block,  EF estimated at 40% No EKG changes concerning for ischemia at peak stress or in recovery. Baseline EKG with LBBB Low risk scan Study compared to prior study dated 10/2010, no significant change noted, prior defect detailed above secondary to LBBB was previously noted.  Just started on metoprolol succinate 25  labs reviewed Total chol 107, LD 47 CR 1.01  Still smoking No desire to quit at this time  Working outside in yard  U/s Mesenteric:  Normal [place] Mesenteric artery findings. 70 to 99% stenosis in the  celiac  artery.  Known dilatation of the splenic artery with a maximum diameter of 0.99cm,  distally.    No significant increase in the splenic artery when compared to the  previous exams. Mild increase in the celiac artery when compared to the exam on 11/03/18,  however, it does more closely correlate with studies on 09/09/17 and  08/13/16.   chest CT scan earlier in 2019 reviewed with her showing three-vessel coronary calcification, mild aortic arch atherosclerosis  Denies any tachycardia, has not been taking propranolol No recent lipid panel, possibly one at work Thinks it is good but does not know the details Prior LDL 98 Does not really want a cholesterol medication  No regular exercise program, weight has been trending upwards   ultrasound  March 2018 by Dr. Lorretta Harp  documenting greater than 70% celiac artery disease Aortic atherosclerosis  CT scan March 2017 abdomen  moderate aortic atherosclerosis  Previous shoulder discomfort and was  taking meloxicam.  Stress test 11/10/2010 showing no ischemia. Myoview study Outside echocardiogram 11/08/2010 showing normal ejection fraction 60%   Prior CV studies:   The following studies were reviewed today:    Past Medical History:  Diagnosis Date  . 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 Arrhythmia    left bundle branch block  . Arthritis   . Blood in stool   . Chicken pox   . Generalized headaches   . History of blood transfusion   . Kidney disease, chronic, stage III (GFR 30-59 ml/min) (HCC)   . LBBB (left bundle branch block)   . Renal disorder   . Thyroid disease   . UTI (lower urinary tract infection)    Past Surgical History:  Procedure Laterality Date  . ABDOMINAL HYSTERECTOMY  1997  . APPENDECTOMY  1981  . BREAST EXCISIONAL BIOPSY Bilateral 1976   neg  . BREAST SURGERY  1976  . CARDIAC CATHETERIZATION  2004   [facility]  . CARDIAC CATHETERIZATION  2006   [facility]  . cystic fibrosis tumor removal  1983  . THUMB AMPUTATION  1992   traumatic  . TONSILLECTOMY AND ADENOIDECTOMY  1964      Allergies:   Peanuts [peanut oil], Penicillins, Sulfa antibiotics, Influenza vac split [influenza virus vaccine], Mobic [meloxicam], and Nickel   Social History   Tobacco Use  . Smoking status: Current Every Day Smoker    Packs/day: 0.25    Years: 34.00    Pack years: 8.50    Types: Cigarettes    Last attempt to quit: 04/10/2013    Years since quitting: 7.3  . Smokeless tobacco: Never Used  Substance Use Topics  . Alcohol use: Yes    Comment: occasional  . Drug use: No     Current Outpatient Medications on File Prior to Visit  Medication Sig Dispense Refill  . aspirin EC 81 MG tablet Take 1 tablet (81 mg total) by mouth daily.    [name]. buPROPion (WELLBUTRIN SR) 100 MG 12 hr tablet TAKE 1 TABLET BY MOUTH  TWICE DAILY 180 tablet 3  . Calcium Carbonate-Vit D-Min (CALCIUM 1200 PO) Take 1 tablet by mouth daily.    [name]. gabapentin (NEURONTIN) 300 MG capsule TAKE 1 CAPSULE BY MOUTH 3  TIMES DAILY 270 capsule 3  . HYDROcodone-acetaminophen (NORCO/VICODIN) 5-325 MG tablet Take 1 tablet by mouth 2 (two) times daily as needed. 60 tablet 0  . nitroGLYCERIN (NITROSTAT) 0.4 MG SL  tablet DISSOLVE 1 TABLET UNDER THE TONGUE EVERY 5 MINUTES AS  NEEDED FOR CHEST PAIN. MAX  OF 3 TABLETS IN 15 MINUTES. CALL 911 IF PAIN PERSISTS. 100 tablet 3  . progesterone (PROMETRIUM) 200 MG capsule Take 200 mg by mouth at bedtime.    . propranolol (INDERAL) 10 MG tablet TAKE 1 TABLET BY MOUTH 3  TIMES DAILY AS NEEDED 180 tablet 5  . rOPINIRole (REQUIP) 0.5 MG tablet TAKE 1 TABLET(0.5 MG) BY MOUTH THREE TIMES DAILY 90 tablet 1  . rosuvastatin (CRESTOR) 10 MG tablet TAKE 1 TABLET BY MOUTH  DAILY 90 tablet 3  . traMADol (ULTRAM) 50 MG tablet Take 1 tablet (50 mg total) by mouth 2 (two) times daily as needed. for pain 60 tablet 5  . venlafaxine XR (EFFEXOR-XR) 75 MG 24 hr capsule TAKE 1 CAPSULE(75 MG) BY MOUTH DAILY WITH BREAKFAST 90 capsule 3  . vitamin C (  ASCORBIC ACID) 500 MG tablet Take 500 mg by mouth daily.     No current facility-administered medications on file prior to visit.     Family Hx: The patient's family history includes Arthritis in her father and mother; Breast cancer (age of onset: 6) in her mother; Cancer in her mother; Diabetes in her brother, father, maternal aunt, maternal grandmother, paternal grandfather, paternal grandmother, and sister; Heart attack in her mother; Heart disease in her father, maternal grandfather, maternal grandmother, mother, paternal grandfather, paternal grandmother, and sister; Heart disease (age of onset: 25) in her brother; Hodgkin's lymphoma in her father; Hyperlipidemia in her mother; Hypertension in her father and mother; Liver disease in her brother; Lung disease in her brother; Parkinson's disease in her father; Stroke in her paternal grandfather.  ROS:   Please see the history of present illness.    Review of Systems  Constitutional: Positive for weight loss.  HENT: Negative.   Respiratory: Negative.   Cardiovascular: Positive for palpitations.  Gastrointestinal: Negative.   Musculoskeletal: Negative.   Neurological: Negative.    Psychiatric/Behavioral: Negative.   All other systems reviewed and are negative.     Labs/Other Tests and Data Reviewed:    Recent Labs: 07/08/2020: ALT 15; BUN 15; Creatinine, Ser 0.90; Hemoglobin 10.4; Platelets 264; Potassium 4.1; Sodium 136   Recent Lipid Panel Lab Results  Component Value Date/Time   CHOL 107 01/30/2019 04:29 AM   CHOL 99 (L) 06/13/2018 08:29 AM   TRIG 164 (H) 01/30/2019 04:29 AM   HDL 27 (L) 01/30/2019 04:29 AM   HDL 34 (L) 06/13/2018 08:29 AM   CHOLHDL 4.0 01/30/2019 04:29 AM   LDLCALC 47 01/30/2019 04:29 AM   LDLCALC 38 06/13/2018 08:29 AM   LDLDIRECT 104 (H) 08/15/2012 04:09 PM    Wt Readings from Last 3 Encounters:  08/10/20 166 lb (75.3 kg)  07/26/20 166 lb 8 oz (75.5 kg)  07/15/20 166 lb 3.2 oz (75.4 kg)     Exam:    Vital Signs: Vital signs may also be detailed in the HPI BP 138/70 (BP Location: Left Arm, Patient Position: Sitting, Cuff Size: Normal)   Pulse 73   Ht 5\' 3"  (1.6 m)   Wt 166 lb (75.3 kg)   SpO2 97%   BMI 29.41 kg/m   Constitutional:  oriented to person, place, and time. No distress.  HENT:  Head: Grossly normal Eyes:  no discharge. No scleral icterus.  Neck: No JVD, no carotid bruits  Cardiovascular: Regular rate and rhythm, no murmurs appreciated Pulmonary/Chest: Clear to auscultation bilaterally, no wheezes or rails Abdominal: Soft.  no distension.  no tenderness.  Musculoskeletal: Normal range of motion Neurological:  normal muscle tone. Coordination normal. No atrophy Skin: Skin warm and dry Psychiatric: normal affect, pleasant   ASSESSMENT & PLAN:    Problem List Items Addressed This Visit      Cardiology Problems   Celiac artery stenosis (HCC)   Hyperlipidemia   PAD (peripheral artery disease) (HCC) - Primary    Other Visit Diagnoses    Aortic atherosclerosis (HCC)       History of tobacco abuse         Mixed hyperlipidemia Cholesterol is at goal on the current lipid regimen. No changes to the  medications were made. Reports that she will have lab work done with primary care  History of tobacco abuse We have encouraged her to continue to work on weaning her cigarettes and smoking cessation. She will continue to work on  this and does not want any assistance with chantix.   Aortic atherosclerosis (HCC) She has coronary calcification, aortic atherosclerosis, Smoking cessation recommended  Celiac artery stenosis (HCC) Noted on ultrasound, denies symptoms Recommended smoking cessation, continue aggressive lipid management Followed by vascular   Palpitations Started on metoprolol succinate 25 mg daily propranolol as needed Talked about ZIo monitor, will wait for now   Total encounter time more than 25 minutes Greater than 50% was spent in counseling and coordination of care with the patient    Signed, Julien Nordmann, MD  [facility] [address], [place], [place] [address]

## 2020-08-10 NOTE — Patient Instructions (Signed)
Medication Instructions:  No changes  If you need a refill on your cardiac medications before your next appointment, please call your pharmacy.    Lab work: No new labs needed   If you have labs (blood work) drawn today and your tests are completely normal, you will receive your results only by: . MyChart Message (if you have MyChart) OR . A paper copy in the mail If you have any lab test that is abnormal or we need to change your treatment, we will call you to review the results.   Testing/Procedures: No new testing needed   Follow-Up: At [facility], you and your health needs are our priority.  As part of our continuing mission to provide you with exceptional heart care, we have created designated Provider Care Teams.  These Care Teams include your primary Cardiologist (physician) and Advanced Practice Providers (APPs -  Physician Assistants and Nurse Practitioners) who all work together to provide you with the care you need, when you need it.  . You will need a follow up appointment in 12 months  . Providers on your designated Care Team:   . Christopher Berge, NP . Ryan Dunn, PA-C . Jacquelyn Visser, PA-C  Any Other Special Instructions Will Be Listed Below (If Applicable).  COVID-19 Vaccine Information can be found at: [email or web address] For questions related to vaccine distribution or appointments, please email [email or web address] or call [phone].     

## 2020-08-12 DIAGNOSIS — R5383 Other fatigue: Secondary | ICD-10-CM | POA: Diagnosis not present

## 2020-08-12 DIAGNOSIS — R232 Flushing: Secondary | ICD-10-CM | POA: Diagnosis not present

## 2020-08-12 DIAGNOSIS — N951 Menopausal and female climacteric states: Secondary | ICD-10-CM | POA: Diagnosis not present

## 2020-09-02 ENCOUNTER — Telehealth: Payer: Self-pay

## 2020-09-02 MED ORDER — ROSUVASTATIN CALCIUM 10 MG PO TABS: 10.0000 mg | ORAL_TABLET | Freq: Every day | ORAL | 3 refills | Status: DC

## 2020-09-02 MED ORDER — BUPROPION HCL ER (SR) 100 MG PO TB12: 100.0000 mg | ORAL_TABLET | Freq: Two times a day (BID) | ORAL | 3 refills | Status: DC

## 2020-09-02 NOTE — Telephone Encounter (Signed)
Pt called back and states that she is moving most of her maintenance meds to [facility] because she now has [employer] advantage through medicare. She asked you to keep [facility] [place] [facility] her chart as well for when she needs things quickly. FYI

## 2020-09-02 NOTE — Telephone Encounter (Signed)
LMTCB. Need to find out if pt has changed her mail order pharmacy to [facility].

## 2020-09-02 NOTE — Telephone Encounter (Signed)
Pharmacy has been changed and and medications have been sent in to [facility].

## 2020-09-02 NOTE — Addendum Note (Signed)
Addended by: Sandy Salaam on: 09/02/2020 03:34 PM   Modules accepted: Orders

## 2020-09-22 ENCOUNTER — Other Ambulatory Visit: Payer: Self-pay

## 2020-09-22 ENCOUNTER — Other Ambulatory Visit: Payer: Self-pay | Admitting: Internal Medicine

## 2020-09-22 DIAGNOSIS — M19011 Primary osteoarthritis, right shoulder: Secondary | ICD-10-CM

## 2020-09-22 DIAGNOSIS — M19012 Primary osteoarthritis, left shoulder: Secondary | ICD-10-CM

## 2020-09-22 DIAGNOSIS — M545 Low back pain, unspecified: Secondary | ICD-10-CM

## 2020-09-22 MED ORDER — HYDROCODONE-ACETAMINOPHEN 5-325 MG PO TABS: 1.0000 | ORAL_TABLET | Freq: Two times a day (BID) | ORAL | 0 refills | Status: DC | PRN

## 2020-09-22 NOTE — Telephone Encounter (Signed)
Last Refilled: 07/26/2020 Last OV: 07/26/2020 Next OV: 10/26/2020   Pt is in [place] and [facility] [place]. Pharmacy has been added to chart.

## 2020-09-22 NOTE — Telephone Encounter (Signed)
[facility] [place] [facility] showing [facility] in chart

## 2020-09-23 MED ORDER — ACETAMINOPHEN-CODEINE #4 300-60 MG PO TABS: 1.0000 | ORAL_TABLET | Freq: Four times a day (QID) | ORAL | 0 refills | Status: DC | PRN

## 2020-09-23 NOTE — Telephone Encounter (Signed)
[facility]one refill [facility] [place] due to DEA laws prohibiting me from prescribing in [place] .  Sending Tylenol #4 as this is the highest dose of controlled substance allowed   

## 2020-09-23 NOTE — Assessment & Plan Note (Signed)
[facility]one ref[facility] rejected [facility] [place] due to [facility] laws prohibiting me from prescribing in [place] .  Sending Tylenol #4 as this is the highest dose of controlled substance allowed

## 2020-10-15 ENCOUNTER — Other Ambulatory Visit: Payer: Self-pay | Admitting: Internal Medicine

## 2020-10-19 ENCOUNTER — Encounter: Payer: Self-pay | Admitting: Internal Medicine

## 2020-10-26 ENCOUNTER — Ambulatory Visit (INDEPENDENT_AMBULATORY_CARE_PROVIDER_SITE_OTHER): Payer: PPO | Admitting: Internal Medicine

## 2020-10-26 ENCOUNTER — Encounter: Payer: Self-pay | Admitting: Internal Medicine

## 2020-10-26 ENCOUNTER — Other Ambulatory Visit: Payer: Self-pay

## 2020-10-26 DIAGNOSIS — I700 Atherosclerosis of aorta: Secondary | ICD-10-CM

## 2020-10-26 DIAGNOSIS — M19011 Primary osteoarthritis, right shoulder: Secondary | ICD-10-CM

## 2020-10-26 DIAGNOSIS — M19012 Primary osteoarthritis, left shoulder: Secondary | ICD-10-CM | POA: Diagnosis not present

## 2020-10-26 DIAGNOSIS — G8929 Other chronic pain: Secondary | ICD-10-CM | POA: Diagnosis not present

## 2020-10-26 DIAGNOSIS — M545 Low back pain, unspecified: Secondary | ICD-10-CM

## 2020-10-26 MED ORDER — HYDROCODONE-ACETAMINOPHEN 5-325 MG PO TABS: 1.0000 | ORAL_TABLET | Freq: Two times a day (BID) | ORAL | 0 refills | Status: DC | PRN

## 2020-10-26 NOTE — Progress Notes (Signed)
Subjective:  Patient ID: Cheryl [name], female    DOB: [birth year]  Age: 65 y.o. MRN: [id number]  CC: Diagnoses of Osteoarthritis of both shoulders, Aortic atherosclerosis (HCC), and Chronic low back pain without sciatica, unspecified back pain laterality were pertinent to this visit.  HPI Cheryl [name] presents for follow up on chronic back pain managed with hydrocodone  This visit occurred during the SARS-CoV-2 public health emergency.  Safety protocols were in place, including screening questions prior to the visit, additional usage of staff PPE, and extensive cleaning of exam room while observing appropriate contact time as indicated for disinfecting solutions.   Pain was managed wit Tylenol with codeine during recent trip to [place]  To see their son due to interstate laws regarding controlled substances .  Returned 2 weeks ago  . 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  [place]  Still hurting from the physical exertion both there and athome   Reviewed findings of prior CT scan today AND THE PRESENCE OF AORTIC AND CORONARY ATHEROSCLEROSIS .[name]  Patient is tolerating ROSUVASTATIN   Outpatient Medications Prior to Visit  Medication Sig Dispense Refill  . aspirin EC 81 MG tablet Take 1 tablet (81 mg total) by mouth daily.    [name] buPROPion (WELLBUTRIN SR) 100 MG 12 hr tablet Take 1 tablet (100 mg total) by mouth 2 (two) times daily. 180 tablet 3  . Calcium Carbonate-Vit D-Min (CALCIUM 1200 PO) Take 1 tablet by mouth daily.    [name] gabapentin (NEURONTIN) 300 MG capsule TAKE 1 CAPSULE BY MOUTH 3  TIMES DAILY 270 capsule 3  . metoprolol succinate (TOPROL-XL) 25 MG 24 hr tablet Take 1 tablet by mouth daily.    . nitroGLYCERIN (NITROSTAT) 0.4 MG SL tablet DISSOLVE 1 TABLET UNDER THE TONGUE EVERY 5 MINUTES AS  NEEDED FOR CHEST PAIN. MAX  OF 3 TABLETS IN 15 MINUTES. CALL 911 IF PAIN PERSISTS. 100 tablet 3  . progesterone (PROMETRIUM) 200 MG capsule Take 200 mg by mouth at bedtime.     . propranolol (INDERAL) 10 MG tablet TAKE 1 TABLET BY MOUTH 3  TIMES DAILY AS NEEDED 180 tablet 5  . rOPINIRole (REQUIP) 0.5 MG tablet TAKE 1 TABLET(0.5 MG) BY MOUTH THREE TIMES DAILY 90 tablet 1  . rosuvastatin (CRESTOR) 10 MG tablet TAKE 1 TABLET BY MOUTH  DAILY 90 tablet 3  . traMADol (ULTRAM) 50 MG tablet Take 1 tablet (50 mg total) by mouth 2 (two) times daily as needed. for pain 60 tablet 5  . venlafaxine XR (EFFEXOR-XR) 75 MG 24 hr capsule TAKE 1 CAPSULE(75 MG) BY MOUTH DAILY WITH BREAKFAST 90 capsule 3  . vitamin C (ASCORBIC ACID) 500 MG tablet Take 500 mg by mouth daily.    [name] acetaminophen-codeine (TYLENOL #4) 300-60 MG tablet Take 1 tablet by mouth every 6 (six) hours as needed for moderate pain. (Patient not taking: Reported on 10/26/2020) 120 tablet 0   No facility-administered medications prior to visit.    Review of Systems;  Patient denies headache, fevers, malaise, unintentional weight loss, skin rash, eye pain, sinus congestion and sinus pain, sore throat, dysphagia,  hemoptysis , cough, dyspnea, wheezing, chest pain, palpitations, orthopnea, edema, abdominal pain, nausea, melena, diarrhea, constipation, flank pain, dysuria, hematuria, urinary  Frequency, nocturia, numbness, tingling, seizures,  Focal weakness, Loss of consciousness,  Tremor, insomnia, depression, anxiety, and suicidal ideation.      Objective:  BP 128/68 (BP Location: Left Arm, Patient Position: Sitting, Cuff Size:  Normal)   Pulse 66   Temp (!) 96.6 F (35.9 C) (Temporal)   Resp 15   Ht 5\' 3"  (1.6 m)   Wt 157 lb 3.2 oz (71.3 kg)   SpO2 98%   BMI 27.85 kg/m   BP Readings from Last 3 Encounters:  10/26/20 128/68  08/10/20 138/70  07/26/20 130/68    Wt Readings from Last 3 Encounters:  10/26/20 157 lb 3.2 oz (71.3 kg)  08/10/20 166 lb (75.3 kg)  07/26/20 166 lb 8 oz (75.5 kg)    General appearance: alert, cooperative and appears stated age Ears: normal TM's and external ear canals both  ears Throat: lips, mucosa, and tongue normal; teeth and gums normal Neck: no adenopathy, no carotid bruit, supple, symmetrical, trachea midline and thyroid not enlarged, symmetric, no tenderness/mass/nodules Back: symmetric, no curvature. ROM normal. No CVA tenderness. Lungs: clear to auscultation bilaterally Heart: regular rate and rhythm, S1, S2 normal, no murmur, click, rub or gallop Abdomen: soft, non-tender; bowel sounds normal; no masses,  no organomegaly Pulses: 2+ and symmetric Skin: Skin color, texture, turgor normal. No rashes or lesions Lymph nodes: Cervical, supraclavicular, and axillary nodes normal.  Lab Results  Component Value Date   HGBA1C 5.1 10/26/2015   HGBA1C 5.4 11/25/2013    Lab Results  Component Value Date   CREATININE 0.90 07/08/2020   CREATININE 1.02 (H) 05/23/2020   CREATININE 0.98 01/11/2020    Lab Results  Component Value Date   WBC 11.3 (H) 07/08/2020   HGB 10.4 (L) 07/08/2020   HCT 32.0 (L) 07/08/2020   PLT 264 07/08/2020   GLUCOSE 100 (H) 07/08/2020   CHOL 107 01/30/2019   TRIG 164 (H) 01/30/2019   HDL 27 (L) 01/30/2019   LDLDIRECT 104 (H) 08/15/2012   LDLCALC 47 01/30/2019   ALT 15 07/08/2020   AST 16 07/08/2020   NA 136 07/08/2020   K 4.1 07/08/2020   CL 106 07/08/2020   CREATININE 0.90 07/08/2020   BUN 15 07/08/2020   CO2 22 07/08/2020   TSH 1.473 01/29/2019   INR 1.0 09/01/2014   HGBA1C 5.1 10/26/2015    MM 3D SCREEN BREAST BILATERAL  Result Date: 03/29/2020 CLINICAL DATA:  Screening. EXAM: DIGITAL SCREENING BILATERAL MAMMOGRAM WITH TOMO AND CAD COMPARISON:  Previous exam(s). ACR Breast Density Category c: The breast tissue is heterogeneously dense, which may obscure small masses. FINDINGS: There are no findings suspicious for malignancy. Images were processed with CAD. IMPRESSION: No mammographic evidence of malignancy. A result letter of this screening mammogram will be mailed directly to the patient. RECOMMENDATION: Screening  mammogram in one year. (Code:SM-B-01Y) BI-RADS CATEGORY  1: Negative. Electronically Signed   By: 13/06/2019 III M.D   On: 03/29/2020 13:01    Assessment & Plan:   Problem List Items Addressed This Visit      Unprioritized   Aortic atherosclerosis [facility])    Reviewed findings of prior CT scan today..  Patient is tolerating high potency statin therapy       Relevant Medications   metoprolol succinate (TOPROL-XL) 25 MG 24 hr tablet   Chronic lower back pain    pain was not controlled with Celebrex and it raised her blood pressure.  [place] her pain  .  She will resume  use of vicodin TWO TIMES DAILY  and continue tramadol for  daytime use.   Refill history confirmed via [place] Controlled Substance databas, accessed by me today.  Refills  GIVEN FOR 2 months .       Relevant Medications   HYDROcodone-acetaminophen (NORCO/VICODIN) 5-325 MG tablet   Osteoarthritis of both shoulders   Relevant Medications   HYDROcodone-acetaminophen (NORCO/VICODIN) 5-325 MG tablet     I provided  30 minutes of  face-to-face time during this encounter reviewing patient's atherosclerosis and pain management , labs and imaging studies, providing counseling on the above mentioned problems , and coordination  of care .  I have discontinued Cheryl Deist A. Estell "Kathy"'s acetaminophen-codeine. I am also having her maintain her Calcium Carbonate-Vit D-Min (CALCIUM 1200 PO), vitamin C, aspirin EC, progesterone, nitroGLYCERIN, gabapentin, propranolol, venlafaxine XR, rOPINIRole, traMADol, buPROPion, rosuvastatin, metoprolol succinate, and HYDROcodone-acetaminophen.  Meds ordered this encounter  Medications  . HYDROcodone-acetaminophen (NORCO/VICODIN) 5-325 MG tablet    Sig: Take 1 tablet by mouth 2 (two) times daily as needed.    Dispense:  60 tablet    Refill:  0    Medications Discontinued During This Encounter  Medication Reason  . acetaminophen-codeine  (TYLENOL #4) 300-60 MG tablet Completed Course    Follow-up: No follow-ups on file.   Sherlene Shams, MD

## 2020-10-27 DIAGNOSIS — I700 Atherosclerosis of aorta: Secondary | ICD-10-CM | POA: Insufficient documentation

## 2020-10-27 NOTE — Assessment & Plan Note (Signed)
pain was not controlled with Celebrex and it raised her blood pressure.  [place] did not manage her pain  .  She will resume  use of vicodin TWO TIMES DAILY  and continue tramadol for  daytime use.   Refill history confirmed via [place] Controlled Substance databas, accessed by me today.  Refills  GIVEN FOR 2 months .

## 2020-10-27 NOTE — Assessment & Plan Note (Signed)
Reviewed findings of prior CT scan today..  Patient is tolerating high potency statin therapy  

## 2020-11-08 ENCOUNTER — Telehealth: Payer: Self-pay | Admitting: Internal Medicine

## 2020-11-08 ENCOUNTER — Other Ambulatory Visit: Payer: Self-pay

## 2020-11-08 NOTE — Telephone Encounter (Signed)
See Medication refill.

## 2020-11-08 NOTE — Telephone Encounter (Signed)
Pt needs a refill on traMADol (ULTRAM) 50 MG [facility] [place]

## 2020-11-08 NOTE — Telephone Encounter (Signed)
Patient requests a refill of Tramadol. Last seen 10/26/2020 Last Refilled 06/02/2020

## 2020-11-10 MED ORDER — TRAMADOL HCL 50 MG PO TABS: 50.0000 mg | ORAL_TABLET | Freq: Two times a day (BID) | ORAL | 5 refills | Status: DC | PRN

## 2020-11-11 DIAGNOSIS — N951 Menopausal and female climacteric states: Secondary | ICD-10-CM | POA: Diagnosis not present

## 2020-11-18 ENCOUNTER — Other Ambulatory Visit: Payer: Self-pay

## 2020-11-18 ENCOUNTER — Telehealth: Payer: Self-pay | Admitting: Internal Medicine

## 2020-11-18 DIAGNOSIS — R232 Flushing: Secondary | ICD-10-CM | POA: Diagnosis not present

## 2020-11-18 DIAGNOSIS — R5383 Other fatigue: Secondary | ICD-10-CM | POA: Diagnosis not present

## 2020-11-18 DIAGNOSIS — N951 Menopausal and female climacteric states: Secondary | ICD-10-CM | POA: Diagnosis not present

## 2020-11-18 DIAGNOSIS — Z6827 Body mass index (BMI) 27.0-27.9, adult: Secondary | ICD-10-CM | POA: Diagnosis not present

## 2020-11-18 MED ORDER — ROPINIROLE HCL 0.5 MG PO TABS: ORAL_TABLET | ORAL | 1 refills | Status: DC

## 2020-11-18 NOTE — Progress Notes (Signed)
[facility] [place] Phone: [phone] Subjective:   [name], am serving as a scribe for Dr. Hulan Saas.  This visit occurred during the SARS-CoV-2 public health emergency.  Safety protocols were in place, including screening questions prior to the visit, additional usage of staff PPE, and extensive cleaning of exam room while observing appropriate contact time as indicated for disinfecting solutions.    I'm seeing this patient by the request  of:  Crecencio Mc, MD  CC: Right shoulder, right elbow and back pain follow-up  DUK:[id number]  Cheryl [name] is a 65 y.o. female coming in with complaint of LBP. Seen for cervical spine pain, March 2021. Patient states that she has herniated disc at L5 level. Feels pressure with a lot of sitting and has hard time walking due to pain. Pain in R groin more than lower back pain.   Also complaining of R elbow pain since March. Pain is over lateral epicondyle with extension. Pain into ulna.   Patient had to pull a wagon with 300# and is also now having anterior shoulder pain.  Feels the pain more on the anterior aspect of the shoulder.  Certain range of motion gives her more discomfort.  MRI Lumbar 2018 IMPRESSION: 1. Diffusely heterogeneous bone marrow signal intensity. This may be seen in the setting of chronic anemia or in smokers; however, the appearance is also compatible with marrow replacement disorders, including multiple myeloma. Recommend correlation with laboratory studies. 2. Mild lower lumbar degenerative disc disease with mild left foraminal narrowing at L4-L5. 3. No findings to explain the reported right lower extremity symptoms.       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    Arrhythmia    left bundle branch block   Arthritis    Blood in stool    Chicken pox    Generalized headaches    History of blood transfusion    Kidney disease, chronic, stage III (GFR 30-59 ml/min) ([facility])    LBBB (left bundle branch block)    Renal disorder    Thyroid disease    UTI (lower urinary tract infection)    Past Surgical History:  Procedure Laterality Date   [place]   BREAST EXCISIONAL BIOPSY Bilateral 1976   neg   [facility]   CARDIAC CATHETERIZATION  2004   [facility]   CARDIAC CATHETERIZATION  2006   [facility]   cystic fibrosis tumor removal  1983   THUMB AMPUTATION  1992   traumatic   TONSILLECTOMY AND ADENOIDECTOMY  1964   Social History   Socioeconomic History   Marital status: Married    Spouse name: Not on file   Number of children: Not on file   Years of education: Not on file   Highest education level: Not on file  Occupational History   Not on file  Tobacco Use   Smoking status: Every Day    Packs/day: 0.25    Years: 34.00    Pack years: 8.50    Types: Cigarettes    Last attempt to quit: 04/10/2013    Years since quitting: 7.6   Smokeless tobacco: Never  Substance and Sexual Activity   Alcohol use: Yes    Comment: occasional   Drug  use: No   Sexual activity: Not on file  Other Topics Concern   Not on file  Social History Narrative   Married to [employer]; Works for [employer], Agricultural engineer. Quit smoking 2018; ocassional alcohol; lives near to [place].    Social Determinants of Health   Financial Resource Strain: Not on file  Food Insecurity: Not on file  Transportation Needs: Not on file  Physical Activity: Not on file  Stress: Not on file  Social Connections: Not on file   Allergies  Allergen Reactions   Peanuts [Peanut Oil]    Penicillins Other (See Comments)    Hives,rash,nausea,swelling,    Sulfa Antibiotics Other (See Comments)    Hives,rash,nausea,swelling   Influenza  Vac Split [Influenza Virus Vaccine]     Pleurisy  1996   Mobic [Meloxicam] Nausea Only   Nick[place]    Family History  Problem Relation Age of Onset   Heart disease Mother    Arthritis Mother    Cancer Mother        breast   Hyperlipidemia Mother    Hypertension Mother    Heart attack Mother    Breast cancer Mother 25   Heart disease Father    Diabetes Father    Arthritis Father    Hypertension Father    Parkinson's disease Father    Hodgkin's lymphoma Father        hodgkins disease, prostate   Heart disease Sister    Diabetes Sister    Heart disease Brother 59       ami x 8,  4 vessel CABG    Diabetes Brother    Liver disease Brother    Lung disease Brother    Heart disease Maternal Grandmother    Diabetes Maternal Grandmother    Heart disease Maternal Grandfather    Heart disease Paternal Grandmother    Diabetes Paternal Grandmother    Stroke Paternal Grandfather    Heart disease Paternal Grandfather    Diabetes Paternal Grandfather    Diabetes Maternal Aunt     Current Outpatient Medications (Endocrine & Metabolic):    progesterone (PROMETRIUM) 200 MG capsule, Take 200 mg by mouth at bedtime.  Current Outpatient Medications (Cardiovascular):    metoprolol succinate (TOPROL-XL) 25 MG 24 hr tablet, Take 1 tablet by mouth daily.   nitroGLYCERIN (NITROSTAT) 0.4 MG SL tablet, DISSOLVE 1 TABLET UNDER THE TONGUE EVERY 5 MINUTES AS  NEEDED FOR CHEST PAIN. MAX  OF 3 TABLETS IN 15 MINUTES. CALL 911 IF PAIN PERSISTS.   propranolol (INDERAL) 10 MG tablet, TAKE 1 TABLET BY MOUTH 3  TIMES DAILY AS NEEDED   rosuvastatin (CRESTOR) 10 MG tablet, TAKE 1 TABLET BY MOUTH  DAILY   Current Outpatient Medications (Analgesics):    aspirin EC 81 MG tablet, Take 1 tablet (81 mg total) by mouth daily.   HYDROcodone-acetaminophen (NORCO/VICODIN) 5-325 MG tablet, Take 1 tablet by mouth 2 (two) times daily as needed.   traMADol (ULTRAM) 50 MG tablet, Take 1 tablet (50 mg total) by mouth 2 (two)  times daily as needed. for pain   Current Outpatient Medications (Other):    buPROPion (WELLBUTRIN SR) 100 MG 12 hr tablet, Take 1 tablet (100 mg total) by mouth 2 (two) times daily.   Calcium Carbonate-Vit D-Min (CALCIUM 1200 PO), Take 1 tablet by mouth daily.   gabapentin (NEURONTIN) 300 MG capsule, TAKE 1 CAPSULE BY MOUTH 3  TIMES DAILY   rOPINIRole (REQUIP) 0.5 MG tablet, TAKE 1 TABLET(0.5 MG) BY MOUTH  THREE TIMES DAILY   venlafaxine XR (EFFEXOR-XR) 75 MG 24 hr capsule, TAKE 1 CAPSULE(75 MG) BY MOUTH DAILY WITH BREAKFAST   vitamin C (ASCORBIC ACID) 500 MG tablet, Take 500 mg by mouth daily.   Reviewed prior external information including notes and imaging from  primary care provider As well as notes that were available from care everywhere and other healthcare systems.  Past medical history, social, surgical and family history all reviewed in electronic medical record.  No pertanent information unless stated regarding to the chief complaint.   Review of Systems:  No headache, visual changes, nausea, vomiting, diarrhea, constipation, dizziness, abdominal pain, skin rash, fevers, chills, night sweats, weight loss, swollen lymph nodes, joint swelling, chest pain, shortness of breath, mood changes. POSITIVE muscle aches, body aches  Objective  Blood pressure 124/74, pulse 81, height '5\' 3"'  (1.6 m), weight 153 lb (69.4 kg), SpO2 97 %.   General: No apparent distress alert and oriented x3 mood and affect normal, dressed appropriately.  HEENT: Pupils equal, extraocular movements intact  Respiratory: Patient's speak in full sentences and does not appear short of breath  Cardiovascular: No lower extremity edema, non tender, no erythema  Gait very mild antalgic noted. Right shoulder exam shows a mild positive impingement noted with Neer's Hawkins.  Rotator cuff strength though does appear to be intact.  Positive O'Brien's noted. Right elbow tender to palpation of the lateral epicondylar  region.  Patient has pain with resisted extension of the wrist.  Good grip strength are noted. Low back exam does have some mild loss of lordosis.  Some tenderness to palpation of the paraspinal musculature of the lumbar spine right greater than left.  Negative straight leg test.  Limited musculoskeletal ultrasound was performed and interpreted by Lyndal Pulley  Limited ultrasound of patient's right shoulder shows the patient does have calcific changes noted of the posterior tibialis.  Some mild degenerative changes noted of the supraspinatus but no true acute tear appreciated.  Regarding patient's elbow no significant hypoechoic changes noted.  No cortical irregularity of the lateral epicondylar region. Impression: Questionable labral pathology of the shoulder very minimal signs of lateral epicondylitis of the right elbow.  Procedure: Real-time Ultrasound Guided Injection of right glenohumeral joint Device: GE Logiq Q7  Ultrasound guided injection is preferred based studies that show increased duration, increased effect, greater accuracy, decreased procedural pain, increased response rate with ultrasound guided versus blind injection.  Verbal informed consent obtained.  Time-out conducted.  Noted no overlying erythema, induration, or other signs of local infection.  Skin prepped in a sterile fashion.  Local anesthesia: Topical Ethyl chloride.  With sterile technique and under real time ultrasound guidance:  Joint visualized.  23g 1  inch needle inserted posterior approach. Pictures taken for needle placement. Patient did have injection of 2 cc of 1% lidocaine, 2 cc of 0.5% Marcaine, and 1.0 cc of Kenalog 40 mg/dL. Completed without difficulty  Pain immediately resolved suggesting accurate placement of the medication.  Advised to call if fevers/chills, erythema, induration, drainage, or persistent bleeding.  Impression: Technically successful ultrasound guided injection.    Impression and  Recommendations:     The above documentation has been reviewed and is accurate and complete Lyndal Pulley, DO

## 2020-11-18 NOTE — Telephone Encounter (Signed)
Patient is requesting the follow refill; rOPINIRole (REQUIP) 0.5 MG tablet  Pharmacy is [facility], [place]

## 2020-11-21 ENCOUNTER — Ambulatory Visit: Payer: PPO | Admitting: Family Medicine

## 2020-11-21 ENCOUNTER — Other Ambulatory Visit: Payer: Self-pay

## 2020-11-21 ENCOUNTER — Encounter: Payer: Self-pay | Admitting: Family Medicine

## 2020-11-21 ENCOUNTER — Ambulatory Visit: Payer: Self-pay

## 2020-11-21 ENCOUNTER — Ambulatory Visit (INDEPENDENT_AMBULATORY_CARE_PROVIDER_SITE_OTHER): Payer: PPO

## 2020-11-21 VITALS — BP 124/74 | HR 81 | Ht 63.0 in | Wt 153.0 lb

## 2020-11-21 DIAGNOSIS — G8929 Other chronic pain: Secondary | ICD-10-CM

## 2020-11-21 DIAGNOSIS — M25521 Pain in right elbow: Secondary | ICD-10-CM

## 2020-11-21 DIAGNOSIS — M7711 Lateral epicondylitis, right elbow: Secondary | ICD-10-CM | POA: Diagnosis not present

## 2020-11-21 DIAGNOSIS — M545 Low back pain, unspecified: Secondary | ICD-10-CM

## 2020-11-21 DIAGNOSIS — M25511 Pain in right shoulder: Secondary | ICD-10-CM

## 2020-11-21 DIAGNOSIS — M25551 Pain in right hip: Secondary | ICD-10-CM | POA: Diagnosis not present

## 2020-11-21 DIAGNOSIS — M47816 Spondylosis without myelopathy or radiculopathy, lumbar region: Secondary | ICD-10-CM | POA: Diagnosis not present

## 2020-11-21 NOTE — Patient Instructions (Signed)
Xray today Wrist brace Day and night for 2 weeks then at night for 2 weeks MRA if shoulder pain continues See me in 5-6 weeks

## 2020-11-21 NOTE — Assessment & Plan Note (Signed)
Chronic pain overall.  Discussed the potential for the osteopathic manipulation at some point again but will hold at the moment.  We will get x-rays.  We will have patient feel for more of the shoulder pain at the moment.  Patient wants to focus on this and then we will have her follow-up again in 6 to 8 weeks

## 2020-11-21 NOTE — Assessment & Plan Note (Signed)
Elbow anatomy was reviewed, and tendinopathy was explained.  Pt. given a home rehab program. Start with isometrics and ROM, then a series of concentric and eccentric exercises should be done starting with no weight, work up to 1 lb, hammer, etc.  Use counterforce strap if working or using hands.  Formal PT would be beneficial. Emphasized stretching an cross-friction massage Emphasized proper palms up lifting biomechanics to unload ECRB Follow-up again in 6 weeks

## 2020-11-21 NOTE — Assessment & Plan Note (Addendum)
Injection given November 21, 2020 Patient given injection today and tolerated the procedure well.  Patient does have some labral pathology noted and if patient does not make significant improvement do feel that possibility of advanced imaging is warranted.  Hoping the patient now does do relatively well overall.  Discussed icing regimen and home exercises.  Discussed topical anti-inflammatories.  Follow-up again in 6 weeks

## 2020-11-25 ENCOUNTER — Telehealth: Payer: Self-pay | Admitting: Internal Medicine

## 2020-11-25 DIAGNOSIS — M19012 Primary osteoarthritis, left shoulder: Secondary | ICD-10-CM

## 2020-11-25 MED ORDER — HYDROCODONE-ACETAMINOPHEN 5-325 MG PO TABS: 1.0000 | ORAL_TABLET | Freq: Two times a day (BID) | ORAL | 0 refills | Status: DC | PRN

## 2020-11-25 NOTE — Telephone Encounter (Signed)
RX Refill:norco Last Seen:10-26-20 Last ordered:10-26-20

## 2020-11-25 NOTE — Telephone Encounter (Signed)
Patient needs a refill on her HYDROcodone-acetaminophen (NORCO/VICODIN) 5-325 MG tablet, pharmacy is [facility] [place]

## 2020-12-05 ENCOUNTER — Encounter: Payer: Self-pay | Admitting: Internal Medicine

## 2020-12-06 ENCOUNTER — Encounter: Payer: Self-pay | Admitting: Internal Medicine

## 2020-12-09 ENCOUNTER — Other Ambulatory Visit (INDEPENDENT_AMBULATORY_CARE_PROVIDER_SITE_OTHER): Payer: Self-pay | Admitting: Vascular Surgery

## 2020-12-09 DIAGNOSIS — I728 Aneurysm of other specified arteries: Secondary | ICD-10-CM

## 2020-12-09 DIAGNOSIS — I6523 Occlusion and stenosis of bilateral carotid arteries: Secondary | ICD-10-CM

## 2020-12-11 ENCOUNTER — Other Ambulatory Visit: Payer: Self-pay | Admitting: Internal Medicine

## 2020-12-12 ENCOUNTER — Ambulatory Visit (INDEPENDENT_AMBULATORY_CARE_PROVIDER_SITE_OTHER): Payer: PPO

## 2020-12-12 ENCOUNTER — Encounter (INDEPENDENT_AMBULATORY_CARE_PROVIDER_SITE_OTHER): Payer: Self-pay | Admitting: Vascular Surgery

## 2020-12-12 ENCOUNTER — Other Ambulatory Visit: Payer: Self-pay

## 2020-12-12 ENCOUNTER — Ambulatory Visit (INDEPENDENT_AMBULATORY_CARE_PROVIDER_SITE_OTHER): Payer: PPO | Admitting: Vascular Surgery

## 2020-12-12 VITALS — BP 146/71 | HR 77 | Resp 16 | Ht 63.0 in | Wt 152.0 lb

## 2020-12-12 DIAGNOSIS — I6523 Occlusion and stenosis of bilateral carotid arteries: Secondary | ICD-10-CM

## 2020-12-12 DIAGNOSIS — I701 Atherosclerosis of renal artery: Secondary | ICD-10-CM | POA: Diagnosis not present

## 2020-12-12 DIAGNOSIS — I728 Aneurysm of other specified arteries: Secondary | ICD-10-CM | POA: Diagnosis not present

## 2020-12-12 DIAGNOSIS — I739 Peripheral vascular disease, unspecified: Secondary | ICD-10-CM | POA: Diagnosis not present

## 2020-12-12 DIAGNOSIS — I771 Stricture of artery: Secondary | ICD-10-CM

## 2020-12-12 DIAGNOSIS — I774 Celiac artery compression syndrome: Secondary | ICD-10-CM

## 2020-12-12 NOTE — Progress Notes (Signed)
MRN : [id number]  Cheryl LaineKathryn Ann [name] is a [age] y.o. (06/01/1955) female who presents with chief complaint of No chief complaint on file. [name].  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  The patient denies abdominal pain or postprandial symptoms.  The patient denies weight loss as well as nausea.  The patient does not substantiate food fear, particular foods do not seem to aggravate or alleviate the symptoms.  The patient denies bloody bowel movements or diarrhea.  No history of peptic ulcer disease.    Recently she has stopped taking Losartan because of hypotension and dizziness   No prior peripheral angiograms or vascular interventions.   The patient denies amaurosis fugax or recent TIA symptoms. There are no recent neurological changes noted. The patient denies worsening claudication symptoms No rest pain symptoms.   The patient denies history of DVT, PE or superficial thrombophlebitis. The patient denies recent episodes of angina    Duplex ultrasound shows mild to moderate plaque at the origin of the celiac and minimal plaque/stenosis of the SMA.  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  No significant change compared to last study  Duplex ultrasound of the carotid arteries shows RICA 1-39% and LICA 40-59%  No outpatient medications have been marked as taking for the 12/12/20 encounter (Appointment) with Gilda CreaseSchnier, Latina CraverGregory G, MD.    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   Arrhythmia    left bundle branch block   Arthritis    Blood in stool    Chicken pox    Generalized headaches    History of blood transfusion    Kidney disease, chronic, stage III (GFR 30-59 ml/min) (HCC)     LBBB (left bundle branch block)    Renal disorder    Thyroid disease    UTI (lower urinary tract infection)     Past Surgical History:  Procedure Laterality Date   ABDOMINAL HYSTERECTOMY  1997   APPENDECTOMY  1981   BREAST EXCISIONAL BIOPSY Bilateral 1976   neg   BREAST SURGERY  1976   CARDIAC CATHETERIZATION  2004   [facility]   CARDIAC CATHETERIZATION  2006   [facility]   cystic fibrosis tumor removal  1983   THUMB AMPUTATION  1992   traumatic   TONSILLECTOMY AND ADENOIDECTOMY  1964    Social History Social History   Tobacco Use   Smoking status: Every Day    Packs/day: 0.25    Years: 34.00    Pack years: 8.50    Types: Cigarettes    Last attempt to quit: 04/10/2013    Years since quitting: 7.6   Smokeless tobacco: Never  Substance Use Topics   Alcohol use: Yes    Comment: occasional   Drug use: No    Family History Family History  Problem Relation Age of Onset   Heart disease Mother    Arthritis Mother    Cancer Mother        breast   Hyperlipidemia Mother    Hypertension Mother    Heart attack Mother    Breast cancer Mother [id number]   Heart disease Father    Diabetes Father    Arthritis Father    Hypertension Father    Parkinson's  disease Father    Hodgkin's lymphoma Father        hodgkins disease, prostate   Heart disease Sister    Diabetes Sister    Heart disease Brother 26       ami x 8,  4 vessel CABG    Diabetes Brother    Liver disease Brother    Lung disease Brother    Heart disease Maternal Grandmother    Diabetes Maternal Grandmother    Heart disease Maternal Grandfather    Heart disease Paternal Grandmother    Diabetes Paternal Grandmother    Stroke Paternal Grandfather    Heart disease Paternal Grandfather    Diabetes Paternal Grandfather    Diabetes Maternal Aunt     Allergies  Allergen Reactions   Peanuts [Peanut Oil]    Penicillins Other (See Comments)    Hives,rash,nausea,swelling,    Sulfa Antibiotics Other (See Comments)     Hives,rash,nausea,swelling   Influenza Vac Split [Influenza Virus Vaccine]     Pleurisy  1996   Mobic [Meloxicam] Nausea Only   Nickel      REVIEW OF SYSTEMS (Negative unless checked)  Constitutional: [] Weight loss  [] Fever  [] Chills Cardiac: [] Chest pain   [] Chest pressure   [] Palpitations   [] Shortness of breath when laying flat   [] Shortness of breath with exertion. Vascular:  [] Pain in legs with walking   [] Pain in legs at rest  [] History of DVT   [] Phlebitis   [] Swelling in legs   [] Varicose veins   [] Non-healing ulcers Pulmonary:   [] Uses home oxygen   [] Productive cough   [] Hemoptysis   [] Wheeze  [] COPD   [] Asthma Neurologic:  [] Dizziness   [] Seizures   [] History of stroke   [] History of TIA  [] Aphasia   [] Vissual changes   [] Weakness or numbness in arm   [] Weakness or numbness in leg Musculoskeletal:   [] Joint swelling   [] Joint pain   [] Low back pain Hematologic:  [] Easy bruising  [] Easy bleeding   [] Hypercoagulable state   [] Anemic Gastrointestinal:  [] Diarrhea   [] Vomiting  [] Gastroesophageal reflux/heartburn   [] Difficulty swallowing. Genitourinary:  [] Chronic kidney disease   [] Difficult urination  [] Frequent urination   [] Blood in urine Skin:  [] Rashes   [] Ulcers  Psychological:  [] History of anxiety   []  History of major depression.  Physical Examination  There were no vitals filed for this visit. There is no height or weight on file to calculate BMI. Gen: WD/WN, NAD Head: [place]/AT, No temporalis wasting.  Ear/Nose/Throat: Hearing grossly intact, nares w/o erythema or drainage Eyes: PER, EOMI, sclera nonicteric.  Neck: Supple, no large masses.   Pulmonary:  Good air movement, no audible wheezing bilaterally, no use of accessory muscles.  Cardiac: RRR, no JVD Vascular:  left carotid bruit Vessel Right Left  Radial Palpable Palpable  PT Palpable Palpable  DP Palpable Palpable  Gastrointestinal: Non-distended. No guarding/no peritoneal signs.  Musculoskeletal: M/S 5/5  throughout.  No deformity or atrophy.  Neurologic: CN 2-12 intact. Symmetrical.  Speech is fluent. Motor exam as listed above. Psychiatric: Judgment intact, Mood & affect appropriate for pt's clinical situation. Dermatologic: No rashes or ulcers noted.  No changes consistent with cellulitis. Lymph : No lichenification or skin changes of chronic lymphedema.  CBC Lab Results  Component Value Date   WBC 11.3 (H) 07/08/2020   HGB 10.4 (L) 07/08/2020   HCT 32.0 (L) 07/08/2020   MCV 68.4 (L) 07/08/2020   PLT 264 07/08/2020    BMET    Component  Value Date/Time   NA 136 07/08/2020 1415   NA 139 05/23/2020 1147   K 4.1 07/08/2020 1415   CL 106 07/08/2020 1415   CO2 22 07/08/2020 1415   GLUCOSE 100 (H) 07/08/2020 1415   BUN 15 07/08/2020 1415   BUN 11 05/23/2020 1147   CREATININE 0.90 07/08/2020 1415   CREATININE 0.95 09/01/2014 1659   CREATININE 0.80 08/15/2012 1609   CALCIUM 9.0 07/08/2020 1415   CALCIUM 9.7 09/01/2014 1659   GFRNONAA >60 07/08/2020 1415   GFRNONAA >60 09/01/2014 1659   GFRAA 67 05/23/2020 1147   GFRAA >60 09/01/2014 1659   CrCl cannot be calculated (Patient's most recent lab result is older than the maximum 21 days allowed.).  COAG Lab Results  Component Value Date   INR 1.0 09/01/2014    Radiology DG Lumbar Spine 2-3 Views  Result Date: 11/21/2020 CLINICAL DATA:  Back pain EXAM: LUMBAR SPINE - 2-3 VIEW COMPARISON:  07/21/2015, MRI 03/23/2020 FINDINGS: Lumbar alignment within normal limits. Vertebral body heights are maintained. Mild degenerative osteophyte at L5-S1. Vertebral body heights are maintained. Aortic atherosclerosis. IMPRESSION: Mild degenerative changes. Electronically Signed   By: Jasmine Pang M.D.   On: 11/21/2020 22:54   DG Shoulder Right  Result Date: 11/21/2020 CLINICAL DATA:  Shoulder pain EXAM: RIGHT SHOULDER - 2+ VIEW COMPARISON:  None. FINDINGS: There is no evidence of fracture or dislocation. There is no evidence of arthropathy or  other focal bone abnormality. Soft tissues are unremarkable. IMPRESSION: Negative. Electronically Signed   By: Jasmine Pang M.D.   On: 11/21/2020 22:52   DG ELBOW COMPLETE RIGHT (3+VIEW)  Result Date: 11/21/2020 CLINICAL DATA:  Elbow pain EXAM: RIGHT ELBOW - COMPLETE 3+ VIEW COMPARISON:  None. FINDINGS: There is no evidence of fracture, dislocation, or joint effusion. There is no evidence of arthropathy or other focal bone abnormality. Soft tissues are unremarkable. IMPRESSION: Negative. Electronically Signed   By: Jasmine Pang M.D.   On: 11/21/2020 22:53   [place] LIMITED JOINT SPACE STRUCTURES UP RIGHT(NO LINKED CHARGES)  Result Date: 11/25/2020 Formatting of this result is different from the original. Limited musculoskeletal ultrasound was performed and interpreted by Judi Saa Limited ultrasound of patient's right shoulder shows the patient does have calcific changes noted of the posterior tibialis.  Some mild degenerative changes noted of the supraspinatus but no true acute tear appreciated.   Regarding patient's elbow no significant hypoechoic changes noted.  No cortical irregularity of the lateral epicondylar region. Impression: Questionable labral pathology of the shoulder very minimal signs of lateral epicondylitis of the right elbow.   Procedure: Real-time Ultrasound Guided Injection of right glenohumeral joint Device: GE Logiq Q7 Ultrasound guided injection is preferred based studies that show increased duration, increased effect, greater accuracy, decreased procedural pain, increased response rate with ultrasound guided versus blind injection. Verbal informed consent obtained. Time-out conducted. Noted no overlying erythema, induration, or other signs of local infection. Skin prepped in a sterile fashion. Local anesthesia: Topical Ethyl chloride. With sterile technique and under real time ultrasound guidance:  Joint visualized.  23g 1  inch needle inserted posterior approach. Pictures taken  for needle placement. Patient did have injection of 2 cc of 1% lidocaine, 2 cc of 0.5% Marcaine, and 1.0 cc of Kenalog 40 mg/dL. Completed without difficulty Pain immediately resolved suggesting accurate placement of the medication. Advised to call if fevers/chills, erythema, induration, drainage, or persistent bleeding. Impression: Technically successful ultrasound guided injection.  DG HIP UNILAT WITH PELVIS 2-3 VIEWS RIGHT  Result Date: 11/21/2020 CLINICAL DATA:  Right hip pain EXAM: DG HIP (WITH OR WITHOUT PELVIS) 2-3V RIGHT COMPARISON:  None. FINDINGS: There is no evidence of hip fracture or dislocation. There is no evidence of arthropathy or other focal bone abnormality. IMPRESSION: Negative. Electronically Signed   By: Jasmine Pang M.D.   On: 11/21/2020 22:52     Assessment/Plan 1. Celiac artery stenosis (HCC) Recommend:   The patient has evidence of chronic asymptomatic mesenteric atherosclerosis.  The patient denies lifestyle limiting changes at this point in time.  Given the lack of symptoms no intervention is warranted at this time.   No further invasive studies, angiography or surgery at this time   The patient should continue walking and begin a more formal exercise program.   The patient should continue antiplatelet therapy and aggressive treatment of the lipid abnormalities   Patient should undergo noninvasive studies as ordered. The patient will follow up with me after the studies.  - VAS [place] MESENTERIC; Future  2. Bilateral carotid artery stenosis Recommend:  Given the patient's asymptomatic subcritical stenosis no further invasive testing or surgery at this time.  Duplex ultrasound of the carotid arteries shows RICA 1-39% and LICA 40-59%.  Continue antiplatelet therapy as prescribed Continue management of CAD, HTN and Hyperlipidemia Healthy heart diet,  encouraged exercise at least 4 times per week Follow up in 12 months with duplex ultrasound and physical exam.     - VAS US CAROTID; Future  3. Splenic artery aneurysm (HCC) The splenic artery remains small and does not need repair at this time.  4. Renal artery stenosis (HCC)  Given patient's arterial disease optimal control of the patient's hypertension is important.   BP is acceptable today and she has actually decreased her medications   The patient's vital signs and noninvasive studies support the renal artery stenosis is not significantly increased when compared to the previous study.   No invasive studies or intervention is indicated at this time.   The patient will continue the current antihypertensive medications, no changes at this time.   The primary medical service will continue aggressive antihypertensive therapy as per the AHA guidelines   5. PAD (peripheral artery disease) (HCC) Recommend:   The patient has evidence of atherosclerosis of the lower extremities with claudication.  The patient does not voice lifestyle limiting changes at this point in time.   Noninvasive studies do not suggest clinically significant change.   No invasive studies, angiography or surgery at this time The patient should continue walking and begin a more formal exercise program. The patient should continue antiplatelet therapy and aggressive treatment of the lipid abnormalities   No changes in the patient's medications at this time   The patient should continue wearing graduated compression socks 10-15 mmHg strength to control the mild edema.      Levora Dredge, MD  12/12/2020 8:24 AM

## 2020-12-13 ENCOUNTER — Encounter (INDEPENDENT_AMBULATORY_CARE_PROVIDER_SITE_OTHER): Payer: Self-pay | Admitting: Vascular Surgery

## 2020-12-27 ENCOUNTER — Telehealth: Payer: Self-pay | Admitting: Internal Medicine

## 2020-12-27 DIAGNOSIS — M19011 Primary osteoarthritis, right shoulder: Secondary | ICD-10-CM

## 2020-12-27 DIAGNOSIS — M19012 Primary osteoarthritis, left shoulder: Secondary | ICD-10-CM

## 2020-12-27 NOTE — Telephone Encounter (Signed)
RX Refill:norco Last Seen:10-26-20 Last ordered:11-25-20

## 2020-12-27 NOTE — Telephone Encounter (Signed)
Pt needs a refill on HYDROcodone-acetaminophen (NORCO/VICODIN) 5-325 MG tablet sent to [facility]

## 2020-12-28 MED ORDER — HYDROCODONE-ACETAMINOPHEN 5-325 MG PO TABS: 1.0000 | ORAL_TABLET | Freq: Two times a day (BID) | ORAL | 0 refills | Status: DC | PRN

## 2020-12-28 NOTE — Progress Notes (Signed)
[name] [address] [place] [address] Phone: [phone] Subjective:    I'm seeing this patient by the request  of:  Sherlene Shams, MD  CC: Back and neck pain follow-up  [id number]  11/21/2020 Elbow anatomy was reviewed, and tendinopathy was explained.   Pt. given a home rehab program. Start with isometrics and ROM, then a series of concentric and eccentric exercises should be done starting with no weight, work up to 1 lb, hammer, etc.   Use counterforce strap if working or using hands.   Formal PT would be beneficial. Emphasized stretching an cross-friction massage Emphasized proper palms up lifting biomechanics to unload ECRB Follow-up again in 6 weeks  Injection given November 21, 2020 Patient given injection today and tolerated the procedure well.  Patient does have some labral pathology noted and if patient does not make significant improvement do feel that possibility of advanced imaging is warranted.  Hoping the patient now does do relatively well overall.  Discussed icing regimen and home exercises.  Discussed topical anti-inflammatories.  Follow-up again in 6 weeks   Update 12/29/2020 Cheryl [name] is a 65 y.o. female coming in with complaint of LBP. states that she is doing well, started Water aerobics and has been helping a lot. Patient cannot do the arm workout as well because the right shoulder starts to bother her. Patient states that she hurt her right elbow last night when reaching for the blankets that fell on the ground.   Xray R hip 11/21/2020 IMPRESSION: Negative.  Xray R elbow 11/21/2020 IMPRESSION: Negative.  Xray R shoulder 11/21/2020 IMPRESSION: Negative.  Xray Lumbar 11/21/2020 IMPRESSION: Mild degenerative changes.         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   Arrhythmia    left bundle branch block   Arthritis    Blood in stool    Chicken pox    Generalized headaches    History of blood transfusion    Kidney disease, chronic, stage III (GFR 30-59 ml/min) (HCC)    LBBB (left bundle branch block)    Renal disorder    Thyroid disease    UTI (lower urinary tract infection)    Past Surgical History:  Procedure Laterality Date   ABDOMINAL HYSTERECTOMY  1997   APPENDECTOMY  1981   BREAST EXCISIONAL BIOPSY Bilateral 1976   neg   BREAST SURGERY  1976   CARDIAC CATHETERIZATION  2004   [facility]   CARDIAC CATHETERIZATION  2006   [facility]   cystic fibrosis tumor removal  1983   THUMB AMPUTATION  1992   traumatic   TONSILLECTOMY AND ADENOIDECTOMY  1964   Social History   Socioeconomic History   Marital status: Married    Spouse name: Not on file   Number of children: Not on file   Years of education: Not on file   Highest education level: Not on file  Occupational History   Not on file  Tobacco Use   Smoking status: Every Day    Packs/day: 0.25    Years: 34.00    Pack years: 8.50    Types: Cigarettes    Last attempt to quit: 04/10/2013    Years since quitting: 7.7   Smokeless tobacco: Never  Substance and Sexual Activity   Alcohol use: Yes  Comment: occasional   Drug use: No   Sexual activity: Not on file  Other Topics Concern   Not on file  Social History Narrative   Married to [employer]; Works for [employer], Engineer, structural. Quit smoking 2018; ocassional alcohol; lives near to [place].    Social Determinants of Health   Financial Resource Strain: Not on file  Food Insecurity: Not on file  Transportation Needs: Not on file  Physical Activity: Not on file  Stress: Not on file  Social Connections: Not on file   Allergies  Allergen Reactions   Peanuts [Peanut Oil]    Penicillins Other (See Comments)    Hives,rash,nausea,swelling,    Sulfa Antibiotics Other (See Comments)     Hives,rash,nausea,swelling   Influenza Vac Split [Influenza Virus Vaccine]     Pleurisy  1996   Mobic [Meloxicam] Nausea Only   Nickel    Family History  Problem Relation Age of Onset   Heart disease Mother    Arthritis Mother    Cancer Mother        breast   Hyperlipidemia Mother    Hypertension Mother    Heart attack Mother    Breast cancer Mother 50   Heart disease Father    Diabetes Father    Arthritis Father    Hypertension Father    Parkinson's disease Father    Hodgkin's lymphoma Father        hodgkins disease, prostate   Heart disease Sister    Diabetes Sister    Heart disease Brother 43       ami x 8,  4 vessel CABG    Diabetes Brother    Liver disease Brother    Lung disease Brother    Heart disease Maternal Grandmother    Diabetes Maternal Grandmother    Heart disease Maternal Grandfather    Heart disease Paternal Grandmother    Diabetes Paternal Grandmother    Stroke Paternal Grandfather    Heart disease Paternal Grandfather    Diabetes Paternal Grandfather    Diabetes Maternal Aunt     Current Outpatient Medications (Endocrine & Metabolic):    progesterone (PROMETRIUM) 200 MG capsule, Take 200 mg by mouth at bedtime.  Current Outpatient Medications (Cardiovascular):    metoprolol succinate (TOPROL-XL) 25 MG 24 hr tablet, Take 1 tablet by mouth daily.   nitroGLYCERIN (NITROSTAT) 0.4 MG SL tablet, DISSOLVE 1 TABLET UNDER THE TONGUE EVERY 5 MINUTES AS  NEEDED FOR CHEST PAIN. MAX  OF 3 TABLETS IN 15 MINUTES. CALL 911 IF PAIN PERSISTS.   propranolol (INDERAL) 10 MG tablet, TAKE 1 TABLET BY MOUTH 3  TIMES DAILY AS NEEDED   rosuvastatin (CRESTOR) 10 MG tablet, TAKE 1 TABLET BY MOUTH  DAILY   Current Outpatient Medications (Analgesics):    aspirin EC 81 MG tablet, Take 1 tablet (81 mg total) by mouth daily.   HYDROcodone-acetaminophen (NORCO/VICODIN) 5-325 MG tablet, Take 1 tablet by mouth 2 (two) times daily as needed.   [START ON 01/27/2021]  HYDROcodone-acetaminophen (NORCO/VICODIN) 5-325 MG tablet, Take 1 tablet by mouth 2 (two) times daily as needed.   traMADol (ULTRAM) 50 MG tablet, Take 1 tablet (50 mg total) by mouth 2 (two) times daily as needed. for pain   Current Outpatient Medications (Other):    buPROPion (WELLBUTRIN SR) 100 MG 12 hr tablet, Take 1 tablet (100 mg total) by mouth 2 (two) times daily.   Calcium Carbonate-Vit D-Min (CALCIUM 1200 PO), Take 1 tablet by mouth daily.   gabapentin (  NEURONTIN) 300 MG capsule, TAKE 1 CAPSULE BY MOUTH 3  TIMES DAILY   rOPINIRole (REQUIP) 0.5 MG tablet, TAKE 1 TABLET(0.5 MG) BY MOUTH THREE TIMES DAILY   venlafaxine XR (EFFEXOR-XR) 75 MG 24 hr capsule, TAKE 1 CAPSULE(75 MG) BY MOUTH DAILY WITH BREAKFAST   vitamin C (ASCORBIC ACID) 500 MG tablet, Take 500 mg by mouth daily.   Reviewed prior external information including notes and imaging from  primary care provider As well as notes that were available from care everywhere and other healthcare systems.  Past medical history, social, surgical and family history all reviewed in electronic medical record.  No pertanent information unless stated regarding to the chief complaint.   Review of Systems:  No headache, visual changes, nausea, vomiting, diarrhea, constipation, dizziness, abdominal pain, skin rash, fevers, chills, night sweats, weight loss, swollen lymph nodes, body aches, joint swelling, chest pain, shortness of breath, mood changes. POSITIVE muscle aches  Objective  Blood pressure 120/72, pulse 73, height 5\' 3"  (1.6 m), weight 152 lb (68.9 kg), SpO2 97 %.   General: No apparent distress alert and oriented x3 mood and affect normal, dressed appropriately.  HEENT: Pupils equal, extraocular movements intact  Respiratory: Patient's speak in full sentences and does not appear short of breath  Cardiovascular: No lower extremity edema, non tender, no erythema  Gait normal with good balance and coordination.  MSK: Neck does have  tightness noted in the paraspinal musculature of the cervical spine.  Negative Spurling's noted. Patient does have tightness in the parascapular region as well.  Mild pain in the lumbar spine.  Specific tightness.  Crepitus.  Negative straight leg test.  Osteopathic findings C2 flexed rotated and side bent right C5 flexed rotated and side bent left T5 extended rotated and side bent right with inhaled rib L3 flexed rotated and side bent right Sacrum right on right   Impression and Recommendations:     The above documentation has been reviewed and is accurate and complete , DO

## 2020-12-28 NOTE — Addendum Note (Signed)
Addended by: Sherlene Shams on: 12/28/2020 10:26 AM   Modules accepted: Orders

## 2020-12-29 ENCOUNTER — Ambulatory Visit: Payer: PPO | Admitting: Family Medicine

## 2020-12-29 ENCOUNTER — Encounter: Payer: Self-pay | Admitting: Family Medicine

## 2020-12-29 ENCOUNTER — Other Ambulatory Visit: Payer: Self-pay

## 2020-12-29 VITALS — BP 120/72 | HR 73 | Ht 63.0 in | Wt 152.0 lb

## 2020-12-29 DIAGNOSIS — M999 Biomechanical lesion, unspecified: Secondary | ICD-10-CM

## 2020-12-29 DIAGNOSIS — M9902 Segmental and somatic dysfunction of thoracic region: Secondary | ICD-10-CM | POA: Diagnosis not present

## 2020-12-29 DIAGNOSIS — M9903 Segmental and somatic dysfunction of lumbar region: Secondary | ICD-10-CM | POA: Diagnosis not present

## 2020-12-29 DIAGNOSIS — M9908 Segmental and somatic dysfunction of rib cage: Secondary | ICD-10-CM

## 2020-12-29 DIAGNOSIS — M5442 Lumbago with sciatica, left side: Secondary | ICD-10-CM

## 2020-12-29 DIAGNOSIS — M9904 Segmental and somatic dysfunction of sacral region: Secondary | ICD-10-CM | POA: Diagnosis not present

## 2020-12-29 DIAGNOSIS — M9901 Segmental and somatic dysfunction of cervical region: Secondary | ICD-10-CM

## 2020-12-29 NOTE — Assessment & Plan Note (Signed)

## 2020-12-29 NOTE — Assessment & Plan Note (Signed)
Stable at the moment.  No significant radicular symptoms.  Discussed icing regimen and home exercises.  Increase activity slowly.  Discussed which activities to doing which wants to avoid.  Patient is doing very well with aquatic therapy.  Follow-up again in 6 to 8 weeks

## 2020-12-29 NOTE — Patient Instructions (Addendum)
Good to see you  You are doing great Started Manipulation again today See you again in 2 months

## 2021-01-04 ENCOUNTER — Inpatient Hospital Stay: Payer: PPO | Attending: Internal Medicine

## 2021-01-04 DIAGNOSIS — D561 Beta thalassemia: Secondary | ICD-10-CM | POA: Insufficient documentation

## 2021-01-04 DIAGNOSIS — E538 Deficiency of other specified B group vitamins: Secondary | ICD-10-CM | POA: Insufficient documentation

## 2021-01-04 DIAGNOSIS — M129 Arthropathy, unspecified: Secondary | ICD-10-CM | POA: Diagnosis not present

## 2021-01-04 DIAGNOSIS — E079 Disorder of thyroid, unspecified: Secondary | ICD-10-CM | POA: Insufficient documentation

## 2021-01-04 DIAGNOSIS — D509 Iron deficiency anemia, unspecified: Secondary | ICD-10-CM | POA: Diagnosis not present

## 2021-01-04 DIAGNOSIS — Z87442 Personal history of urinary calculi: Secondary | ICD-10-CM | POA: Diagnosis not present

## 2021-01-04 DIAGNOSIS — N183 Chronic kidney disease, stage 3 unspecified: Secondary | ICD-10-CM | POA: Insufficient documentation

## 2021-01-04 DIAGNOSIS — Z79899 Other long term (current) drug therapy: Secondary | ICD-10-CM | POA: Diagnosis not present

## 2021-01-04 DIAGNOSIS — F17210 Nicotine dependence, cigarettes, uncomplicated: Secondary | ICD-10-CM | POA: Diagnosis not present

## 2021-01-04 DIAGNOSIS — Z7982 Long term (current) use of aspirin: Secondary | ICD-10-CM | POA: Insufficient documentation

## 2021-01-04 LAB — CBC WITH DIFFERENTIAL/PLATELET
Abs Immature Granulocytes: 0.04 10*3/uL (ref 0.00–0.07)
Basophils Absolute: 0.1 10*3/uL (ref 0.0–0.1)
Basophils Relative: 1 %
Eosinophils Absolute: 0.2 10*3/uL (ref 0.0–0.5)
Eosinophils Relative: 2 %
HCT: 33.2 % — ABNORMAL LOW (ref 36.0–46.0)
Hemoglobin: 10.4 g/dL — ABNORMAL LOW (ref 12.0–15.0)
Immature Granulocytes: 0 %
Lymphocytes Relative: 26 %
Lymphs Abs: 2.5 10*3/uL (ref 0.7–4.0)
MCH: 22 pg — ABNORMAL LOW (ref 26.0–34.0)
MCHC: 31.3 g/dL (ref 30.0–36.0)
MCV: 70.3 fL — ABNORMAL LOW (ref 80.0–100.0)
Monocytes Absolute: 0.8 10*3/uL (ref 0.1–1.0)
Monocytes Relative: 8 %
Neutro Abs: 6.2 10*3/uL (ref 1.7–7.7)
Neutrophils Relative %: 63 %
Platelets: 231 10*3/uL (ref 150–400)
RBC: 4.72 MIL/uL (ref 3.87–5.11)
RDW: 17.9 % — ABNORMAL HIGH (ref 11.5–15.5)
WBC: 9.8 10*3/uL (ref 4.0–10.5)
nRBC: 0.3 % — ABNORMAL HIGH (ref 0.0–0.2)

## 2021-01-04 LAB — BASIC METABOLIC PANEL
Anion gap: 6 (ref 5–15)
BUN: 15 mg/dL (ref 8–23)
CO2: 24 mmol/L (ref 22–32)
Calcium: 8.9 mg/dL (ref 8.9–10.3)
Chloride: 104 mmol/L (ref 98–111)
Creatinine, Ser: 0.74 mg/dL (ref 0.44–1.00)
GFR, Estimated: >60 mL/min (ref >60)
Glucose, Bld: 96 mg/dL (ref 70–99)
Potassium: 4.3 mmol/L (ref 3.5–5.1)
Sodium: 134 mmol/L — ABNORMAL LOW (ref 135–145)

## 2021-01-04 LAB — IRON AND TIBC
Iron: 81 ug/dL (ref 28–170)
Saturation Ratios: 24 % (ref 10.4–31.8)
TIBC: 333 ug/dL (ref 250–450)
UIBC: 252 ug/dL

## 2021-01-04 LAB — VITAMIN B12: Vitamin B-12: 351 pg/mL (ref 180–914)

## 2021-01-04 LAB — FERRITIN: Ferritin: 225 ng/mL (ref 11–307)

## 2021-01-09 ENCOUNTER — Other Ambulatory Visit: Payer: Self-pay

## 2021-01-09 MED ORDER — METOPROLOL SUCCINATE ER 25 MG PO TB24: 25.0000 mg | ORAL_TABLET | Freq: Every day | ORAL | 1 refills | Status: DC

## 2021-01-11 ENCOUNTER — Encounter: Payer: Self-pay | Admitting: Internal Medicine

## 2021-01-11 ENCOUNTER — Inpatient Hospital Stay: Payer: PPO

## 2021-01-11 ENCOUNTER — Other Ambulatory Visit: Payer: Self-pay

## 2021-01-11 ENCOUNTER — Inpatient Hospital Stay (HOSPITAL_BASED_OUTPATIENT_CLINIC_OR_DEPARTMENT_OTHER): Payer: PPO | Admitting: Internal Medicine

## 2021-01-11 ENCOUNTER — Telehealth: Payer: Self-pay | Admitting: Family Medicine

## 2021-01-11 DIAGNOSIS — D563 Thalassemia minor: Secondary | ICD-10-CM | POA: Diagnosis not present

## 2021-01-11 DIAGNOSIS — D561 Beta thalassemia: Secondary | ICD-10-CM | POA: Diagnosis not present

## 2021-01-11 MED ORDER — GABAPENTIN 300 MG PO CAPS
300.0000 mg | ORAL_CAPSULE | Freq: Three times a day (TID) | ORAL | 3 refills | Status: DC
Start: 2021-01-11 — End: 2021-10-17

## 2021-01-11 NOTE — Telephone Encounter (Signed)
Pt requesting refill of Gabapentin to [facility] order pharmacy.

## 2021-01-11 NOTE — Telephone Encounter (Signed)
Rx filled. Patient notified.  

## 2021-01-11 NOTE — Assessment & Plan Note (Signed)
#   Chronic microcytic anemia/history of beta thalassemia - out of proportion to her anemia. S/p IV venoferx4 in fall of 2017.  #Today hemoglobin is 10.4 saturation 27% ferritin 230 [slightly lower compared to previous]; HOLD Venofer. Continue dietary iron.   # B12 deficiency - on PO B12.   # DISPOSITION: # HOLD Venofer today # follow up 12 months-MD-labs-cbc/bmp/iron studies/ferritin/B12- 1 week prior; venofer IV possible- Dr.B.   Cc; Dr.Tullo.

## 2021-01-22 ENCOUNTER — Encounter: Payer: Self-pay | Admitting: Internal Medicine

## 2021-01-22 NOTE — Progress Notes (Signed)
[facility] CONSULT NOTE  Patient Care Team: Sherlene Shams, MD as PCP - General (Internal Medicine) Lemar Livings Merrily Pew, MD as Consulting Physician (General Surgery)  CHIEF COMPLAINTS/PURPOSE OF CONSULTATION: Anemia.  HEMATOLOGY HISTORY  # CHRONIC MICROCYTIC ANEMIA- BETA THALASSEMIA MINOR [colo- 2016; Dr. Fanny Skates July 2017- IV trial of Venofer x4   # Lung nodules [CT- sub cm Jan 2017]-2019 CT scan stable.  Benign.  HISTORY OF PRESENTING ILLNESS:  Cheryl [name] 65 y.o.  female with a history of beta thalassemia minor; also iron deficiency anemia/B12 deficiency is here for follow-up.  Patient is currently retired.  Denies any shortness of breath or cough.  Review of Systems  Constitutional:  Negative for chills, diaphoresis, fever and weight loss.  HENT:  Negative for nosebleeds and sore throat.   Eyes:  Negative for double vision.  Respiratory:  Negative for cough, hemoptysis, sputum production and wheezing.   Cardiovascular:  Negative for chest pain, palpitations, orthopnea and leg swelling.  Gastrointestinal:  Negative for abdominal pain, blood in stool, constipation, diarrhea, heartburn, melena, nausea and vomiting.  Genitourinary:  Negative for dysuria, frequency and urgency.  Musculoskeletal:  Negative for back pain and joint pain.  Skin: Negative.  Negative for itching and rash.  Neurological:  Negative for dizziness, tingling, focal weakness, weakness and headaches.  Endo/Heme/Allergies:  Does not bruise/bleed easily.  Psychiatric/Behavioral:  Negative for depression. The patient is not nervous/anxious and does not have insomnia.     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   Arrhythmia    left bundle branch block   Arthritis    Blood in stool    Chicken pox     Generalized headaches    History of blood transfusion    Kidney disease, chronic, stage III (GFR 30-59 ml/min) (HCC)    LBBB (left bundle branch block)    Renal disorder    Thyroid disease    UTI (lower urinary tract infection)     SURGICAL HISTORY: Past Surgical History:  Procedure Laterality Date   ABDOMINAL HYSTERECTOMY  1997   APPENDECTOMY  1981   BREAST EXCISIONAL BIOPSY Bilateral 1976   neg   BREAST SURGERY  1976   CARDIAC CATHETERIZATION  2004   [facility]   CARDIAC CATHETERIZATION  2006   [facility]   cystic fibrosis tumor removal  1983   THUMB AMPUTATION  1992   traumatic   TONSILLECTOMY AND ADENOIDECTOMY  1964    SOCIAL HISTORY: Social History   Socioeconomic History   Marital status: Married    Spouse name: Not on file   Number of children: Not on file   Years of education: Not on file   Highest education level: Not on file  Occupational History   Not on file  Tobacco Use   Smoking status: Every Day    Packs/day: 0.25    Years: 34.00    Pack years: 8.50    Types: Cigarettes    Last attempt to quit: 04/10/2013    Years since quitting: 7.7   Smokeless tobacco: Never  Substance and Sexual Activity   Alcohol use: Yes    Comment: occasional   Drug use: No   Sexual activity: Not on file  Other Topics Concern   Not on file  Social History Narrative   Married to [employer]; Works  for [employer], Engineer, structural. Quit smoking 2018; ocassional alcohol; lives near to [place].    Social Determinants of Health   Financial Resource Strain: Not on file  Food Insecurity: Not on file  Transportation Needs: Not on file  Physical Activity: Not on file  Stress: Not on file  Social Connections: Not on file  Intimate Partner Violence: Not on file    FAMILY HISTORY: Family History  Problem Relation Age of Onset   Heart disease Mother    Arthritis Mother    Cancer Mother        breast   Hyperlipidemia Mother    Hypertension Mother    Heart attack Mother     Breast cancer Mother 29   Heart disease Father    Diabetes Father    Arthritis Father    Hypertension Father    Parkinson's disease Father    Hodgkin's lymphoma Father        hodgkins disease, prostate   Heart disease Sister    Diabetes Sister    Heart disease Brother 63       ami x 8,  4 vessel CABG    Diabetes Brother    Liver disease Brother    Lung disease Brother    Heart disease Maternal Grandmother    Diabetes Maternal Grandmother    Heart disease Maternal Grandfather    Heart disease Paternal Grandmother    Diabetes Paternal Grandmother    Stroke Paternal Grandfather    Heart disease Paternal Grandfather    Diabetes Paternal Grandfather    Diabetes Maternal Aunt     ALLERGIES:  is allergic to peanuts [peanut oil], penicillins, sulfa antibiotics, influenza vac split [influenza virus vaccine], mobic [meloxicam], and nickel.  MEDICATIONS:  Current Outpatient Medications  Medication Sig Dispense Refill   aspirin EC 81 MG tablet Take 1 tablet (81 mg total) by mouth daily.     buPROPion (WELLBUTRIN SR) 100 MG 12 hr tablet Take 1 tablet (100 mg total) by mouth 2 (two) times daily. 180 tablet 3   Calcium Carbonate-Vit D-Min (CALCIUM 1200 PO) Take 1 tablet by mouth daily.     HYDROcodone-acetaminophen (NORCO/VICODIN) 5-325 MG tablet Take 1 tablet by mouth 2 (two) times daily as needed. 60 tablet 0   [START ON 01/27/2021] HYDROcodone-acetaminophen (NORCO/VICODIN) 5-325 MG tablet Take 1 tablet by mouth 2 (two) times daily as needed. 60 tablet 0   nitroGLYCERIN (NITROSTAT) 0.4 MG SL tablet DISSOLVE 1 TABLET UNDER THE TONGUE EVERY 5 MINUTES AS  NEEDED FOR CHEST PAIN. MAX  OF 3 TABLETS IN 15 MINUTES. CALL 911 IF PAIN PERSISTS. 100 tablet 3   progesterone (PROMETRIUM) 200 MG capsule Take 200 mg by mouth at bedtime.     propranolol (INDERAL) 10 MG tablet TAKE 1 TABLET BY MOUTH 3  TIMES DAILY AS NEEDED 180 tablet 5   rOPINIRole (REQUIP) 0.5 MG tablet TAKE 1 TABLET(0.5 MG) BY MOUTH THREE  TIMES DAILY 90 tablet 1   rosuvastatin (CRESTOR) 10 MG tablet TAKE 1 TABLET BY MOUTH  DAILY 90 tablet 3   traMADol (ULTRAM) 50 MG tablet Take 1 tablet (50 mg total) by mouth 2 (two) times daily as needed. for pain 60 tablet 5   venlafaxine XR (EFFEXOR-XR) 75 MG 24 hr capsule TAKE 1 CAPSULE(75 MG) BY MOUTH DAILY WITH BREAKFAST 90 capsule 3   vitamin C (ASCORBIC ACID) 500 MG tablet Take 500 mg by mouth daily.     gabapentin (NEURONTIN) 300 MG capsule Take 1 capsule (300 mg  total) by mouth 3 (three) times daily. 270 capsule 3   metoprolol succinate (TOPROL-XL) 25 MG 24 hr tablet Take 1 tablet (25 mg total) by mouth daily. (Patient not taking: Reported on 01/11/2021) 90 tablet 1   No current facility-administered medications for this visit.      [name]  PHYSICAL EXAMINATION:   Vitals:   01/11/21 1303  BP: 122/86  Pulse: 69  Resp: 16  Temp: 98.3 F (36.8 C)  SpO2: 100%   Filed Weights   01/11/21 1303  Weight: 152 lb 9.6 oz (69.2 kg)   Physical Exam HENT:     Head: Normocephalic and atraumatic.     Mouth/Throat:     Pharynx: No oropharyngeal exudate.  Eyes:     Pupils: Pupils are equal, round, and reactive to light.  Cardiovascular:     Rate and Rhythm: Normal rate and regular rhythm.  Pulmonary:     Effort: Pulmonary effort is normal. No respiratory distress.     Breath sounds: Normal breath sounds. No wheezing.  Abdominal:     General: Bowel sounds are normal. There is no distension.     Palpations: Abdomen is soft. There is no mass.     Tenderness: There is no abdominal tenderness. There is no guarding or rebound.  Musculoskeletal:        General: No tenderness. Normal range of motion.     Cervical back: Normal range of motion and neck supple.  Skin:    General: Skin is warm.  Neurological:     Mental Status: She is alert and oriented to person, place, and time.  Psychiatric:        Mood and Affect: Affect normal.     LABORATORY DATA:  I have reviewed the data as  listed Lab Results  Component Value Date   WBC 9.8 01/04/2021   HGB 10.4 (L) 01/04/2021   HCT 33.2 (L) 01/04/2021   MCV 70.3 (L) 01/04/2021   PLT 231 01/04/2021   Recent Labs    05/23/20 1147 07/08/20 1415 01/04/21 1436  NA 139 136 134*  K 5.0 4.1 4.3  CL 105 106 104  CO2 20 22 24   GLUCOSE 89 100* 96  BUN 11 15 15   CREATININE 1.02* 0.90 0.74  CALCIUM 9.0 9.0 8.9  GFRNONAA 58* >60 >60  GFRAA 67  --   --   PROT 6.5 6.8  --   ALBUMIN 4.3 4.4  --   AST 12 16  --   ALT 13 15  --   ALKPHOS 81 64  --   BILITOT 0.6 0.8  --      No results found.  ASSESSMENT & PLAN:   Thalassemia minor # Chronic microcytic anemia/history of beta thalassemia - out of proportion to her anemia. S/p IV venoferx4 in fall of 2017.  #Today hemoglobin is 10.4 saturation 27% ferritin 230 [slightly lower compared to previous]; HOLD Venofer. Continue dietary iron.   # B12 deficiency - on PO B12.   # DISPOSITION: # HOLD Venofer today # follow up 12 months-MD-labs-cbc/bmp/iron studies/ferritin/B12- 1 week prior; venofer IV possible- Dr.B.   Cc; Dr.Tullo.    , MD 01/22/2021 9:58 PM

## 2021-02-01 ENCOUNTER — Encounter: Payer: Self-pay | Admitting: Internal Medicine

## 2021-02-01 ENCOUNTER — Ambulatory Visit (INDEPENDENT_AMBULATORY_CARE_PROVIDER_SITE_OTHER): Payer: PPO | Admitting: Internal Medicine

## 2021-02-01 ENCOUNTER — Other Ambulatory Visit: Payer: Self-pay

## 2021-02-01 VITALS — BP 132/72 | HR 81 | Temp 96.7°F | Ht 63.0 in | Wt 152.2 lb

## 2021-02-01 DIAGNOSIS — M545 Low back pain, unspecified: Secondary | ICD-10-CM | POA: Diagnosis not present

## 2021-02-01 DIAGNOSIS — M19012 Primary osteoarthritis, left shoulder: Secondary | ICD-10-CM | POA: Diagnosis not present

## 2021-02-01 DIAGNOSIS — G8929 Other chronic pain: Secondary | ICD-10-CM | POA: Diagnosis not present

## 2021-02-01 DIAGNOSIS — N182 Chronic kidney disease, stage 2 (mild): Secondary | ICD-10-CM | POA: Diagnosis not present

## 2021-02-01 DIAGNOSIS — M19011 Primary osteoarthritis, right shoulder: Secondary | ICD-10-CM

## 2021-02-01 DIAGNOSIS — I728 Aneurysm of other specified arteries: Secondary | ICD-10-CM | POA: Diagnosis not present

## 2021-02-01 DIAGNOSIS — J439 Emphysema, unspecified: Secondary | ICD-10-CM | POA: Diagnosis not present

## 2021-02-01 DIAGNOSIS — I774 Celiac artery compression syndrome: Secondary | ICD-10-CM | POA: Diagnosis not present

## 2021-02-01 DIAGNOSIS — E782 Mixed hyperlipidemia: Secondary | ICD-10-CM | POA: Diagnosis not present

## 2021-02-01 DIAGNOSIS — I771 Stricture of artery: Secondary | ICD-10-CM

## 2021-02-01 MED ORDER — ALBUTEROL SULFATE HFA 108 (90 BASE) MCG/ACT IN AERS: 1.0000 | INHALATION_SPRAY | Freq: Four times a day (QID) | RESPIRATORY_TRACT | 2 refills | Status: DC | PRN

## 2021-02-01 NOTE — Progress Notes (Signed)
Subjective:  Patient ID: Cheryl [name], female    DOB: [birth year]  Age: 65 y.o. MRN: [id number]  CC: The primary encounter diagnosis was Mixed hyperlipidemia. Diagnoses of Chronic low back pain without sciatica, unspecified back pain laterality, Chronic renal impairment, stage 2 (mild), Pulmonary emphysema, unspecified emphysema type (HCC), Celiac artery stenosis (HCC), and Splenic artery aneurysm [facility]) were also pertinent to this visit.  HPI Cheryl [name] presents for follow up on chronic back pain managed with opioid , tobacco abuse and other issues.  Chief Complaint  Patient presents with   Follow-up    Medication refill   This visit occurred during the SARS-CoV-2 public health emergency.  Safety protocols were in place, including screening questions prior to the visit, additional usage of staff PPE, and extensive cleaning of exam room while observing appropriate contact time as indicated for disinfecting solutions.    1) Emphysematous  changes  in bilateral lung apices noted on Sept 2020 CT chest  done in ER to rule out PE.  Stable lung nodule also noted.   Tobacco use reviewed ; using 6 to 7 daily.  Denies dyspnea, but has a chronic moist cough which she attributes to sinus drainage. Has not used albuterol MD in years.  Current MDI Expired august 2021. Reviewed CT changes today   Refill history confirmed via [place] Controlled Substance database, accessed by me today..   Urinates 2 times daily  does not drink enough water   Has not refilled the Sept 2 hydrocodone refill on file at [facility] . Has not run out of hydrocodone.  Currently having recurrent occipital headaches on the right  left slightly higher.  Occurring 3-4 times per week .  Has tried heating pad and massage.  Swimming helps.   Had self described " diverticulitis" episode after eating chinese food: pain,  dizziness,  sweats,  suprapubic pain that resolved after having large volume diarrhea.  First attack in over 20 years.      Outpatient Medications Prior to Visit  Medication Sig Dispense Refill   aspirin EC 81 MG tablet Take 1 tablet (81 mg total) by mouth daily.     buPROPion (WELLBUTRIN SR) 100 MG 12 hr tablet Take 1 tablet (100 mg total) by mouth 2 (two) times daily. 180 tablet 3   Calcium Carbonate-Vit D-Min (CALCIUM 1200 PO) Take 1 tablet by mouth daily.     gabapentin (NEURONTIN) 300 MG capsule Take 1 capsule (300 mg total) by mouth 3 (three) times daily. 270 capsule 3   HYDROcodone-acetaminophen (NORCO/VICODIN) 5-325 MG tablet Take 1 tablet by mouth 2 (two) times daily as needed. 60 tablet 0   HYDROcodone-acetaminophen (NORCO/VICODIN) 5-325 MG tablet Take 1 tablet by mouth 2 (two) times daily as needed. 60 tablet 0   metoprolol succinate (TOPROL-XL) 25 MG 24 hr tablet Take 1 tablet (25 mg total) by mouth daily. 90 tablet 1   nitroGLYCERIN (NITROSTAT) 0.4 MG SL tablet DISSOLVE 1 TABLET UNDER THE TONGUE EVERY 5 MINUTES AS  NEEDED FOR CHEST PAIN. MAX  OF 3 TABLETS IN 15 MINUTES. CALL 911 IF PAIN PERSISTS. 100 tablet 3   progesterone (PROMETRIUM) 200 MG capsule Take 200 mg by mouth at bedtime.     propranolol (INDERAL) 10 MG tablet TAKE 1 TABLET BY MOUTH 3  TIMES DAILY AS NEEDED 180 tablet 5   rOPINIRole (REQUIP) 0.5 MG tablet TAKE 1 TABLET(0.5 MG) BY MOUTH THREE TIMES DAILY 90 tablet 1   rosuvastatin (CRESTOR) 10 MG tablet TAKE 1  TABLET BY MOUTH  DAILY 90 tablet 3   traMADol (ULTRAM) 50 MG tablet Take 1 tablet (50 mg total) by mouth 2 (two) times daily as needed. for pain 60 tablet 5   venlafaxine XR (EFFEXOR-XR) 75 MG 24 hr capsule TAKE 1 CAPSULE(75 MG) BY MOUTH DAILY WITH BREAKFAST 90 capsule 3   vitamin C (ASCORBIC ACID) 500 MG tablet Take 500 mg by mouth daily.     No facility-administered medications prior to visit.    Review of Systems;  Patient denies headache, fevers, malaise, unintentional weight loss, skin rash, eye pain, sinus congestion and sinus pain, sore throat, dysphagia,  hemoptysis ,  cough, dyspnea, wheezing, chest pain, palpitations, orthopnea, edema, abdominal pain, nausea, melena, diarrhea, constipation, flank pain, dysuria, hematuria, urinary  Frequency, nocturia, numbness, tingling, seizures,  Focal weakness, Loss of consciousness,  Tremor, insomnia, depression, anxiety, and suicidal ideation.      Objective:  BP 132/72 (BP Location: Left Arm, Patient Position: Sitting, Cuff Size: Normal)   Pulse 81   Temp (!) 96.7 F (35.9 C) (Temporal)   Ht 5\' 3"  (1.6 m)   Wt 152 lb 3.2 oz (69 kg)   SpO2 97%   BMI 26.96 kg/m   BP Readings from Last 3 Encounters:  02/01/21 132/72  01/11/21 122/86  12/29/20 120/72    Wt Readings from Last 3 Encounters:  02/01/21 152 lb 3.2 oz (69 kg)  01/11/21 152 lb 9.6 oz (69.2 kg)  12/29/20 152 lb (68.9 kg)    General appearance: alert, cooperative and appears stated age Ears: normal TM's and external ear canals both ears Throat: lips, mucosa, and tongue normal; teeth and gums normal Neck: no adenopathy, no carotid bruit, supple, symmetrical, trachea midline and thyroid not enlarged, symmetric, no tenderness/mass/nodules Back: symmetric, no curvature. ROM normal. No CVA tenderness. Lungs: clear to auscultation bilaterally Heart: regular rate and rhythm, S1, S2 normal, no murmur, click, rub or gallop Abdomen: soft, non-tender; bowel sounds normal; no masses,  no organomegaly Pulses: 2+ and symmetric Skin: Skin color, texture, turgor normal. No rashes or lesions Lymph nodes: Cervical, supraclavicular, and axillary nodes normal.  Lab Results  Component Value Date   HGBA1C 5.1 10/26/2015   HGBA1C 5.4 11/25/2013    Lab Results  Component Value Date   CREATININE 0.74 01/04/2021   CREATININE 0.90 07/08/2020   CREATININE 1.02 (H) 05/23/2020    Lab Results  Component Value Date   WBC 9.8 01/04/2021   HGB 10.4 (L) 01/04/2021   HCT 33.2 (L) 01/04/2021   PLT 231 01/04/2021   GLUCOSE 96 01/04/2021   CHOL 114 02/01/2021    TRIG 122.0 02/01/2021   HDL 30.70 (L) 02/01/2021   LDLDIRECT 104 (H) 08/15/2012   LDLCALC 59 02/01/2021   ALT 15 07/08/2020   AST 16 07/08/2020   NA 134 (L) 01/04/2021   K 4.3 01/04/2021   CL 104 01/04/2021   CREATININE 0.74 01/04/2021   BUN 15 01/04/2021   CO2 24 01/04/2021   TSH 1.473 01/29/2019   INR 1.0 09/01/2014   HGBA1C 5.1 10/26/2015    MM 3D SCREEN BREAST BILATERAL  Result Date: 03/29/2020 CLINICAL DATA:  Screening. EXAM: DIGITAL SCREENING BILATERAL MAMMOGRAM WITH TOMO AND CAD COMPARISON:  Previous exam(s). ACR Breast Density Category c: The breast tissue is heterogeneously dense, which may obscure small masses. FINDINGS: There are no findings suspicious for malignancy. Images were processed with CAD. IMPRESSION: No mammographic evidence of malignancy. A result letter of this screening mammogram will be mailed  directly to the patient. RECOMMENDATION: Screening mammogram in one year. (Code:SM-B-01Y) BI-RADS CATEGORY  1: Negative. Electronically Signed   By: Gerome Sam III M.D   On: 03/29/2020 13:01    Assessment & Plan:   Problem List Items Addressed This Visit       Unprioritized   Chronic lower back pain    pain was not controlled with Celebrex and it raised her blood pressure.  Tylenol #3 was tolerated while I n [place] but did not manage her pain  .  She will resume  use of vicodin TWO TIMES DAILY  and continue tramadol for  daytime use.   Refill history confirmed via [place] Controlled Substance databas, accessed by me today.  Refills authorized  FOR 3 months .       Hyperlipidemia - Primary   Relevant Orders   Lipid panel (Completed)   Splenic artery aneurysm (HCC)    Unchanged by serial u/s dopplers done annually by vascular surgery.  She was taking losartan until recently for hypertension;  Medication was discontinued due to symptomatic hypotension..  Advised to monitor BP at home frequently with plans to resume losartan for sbp>130/80      Chronic renal  insufficiency    GFR has finally normalized with suspension of celebrex  Lab Results  Component Value Date   CREATININE 0.74 01/04/2021         Celiac artery stenosis (HCC)    Asymptomatic despite critical stenosis and diagnosis of chronic mesenteric ischemia. Continue statin. Losartan stopped by patient  due to hypotension.  Advised to quit smoking. Annual follow up with vascular surgeon .      Emphysema of lung (HCC)    Involving bilateral lung apices by prior CT.  She is asymptomatic but continues to smoke.  Advice given.  MDI refilled      Relevant Medications   albuterol (PROAIR HFA) 108 (90 Base) MCG/ACT inhaler    I spent 30 minutes dedicated to the care of this patient on the date of this encounter to include pre-visit review of his medical history,  Face-to-face time with the patient , and post visit ordering of testing and therapeutics.   Meds ordered this encounter  Medications   albuterol (PROAIR HFA) 108 (90 Base) MCG/ACT inhaler    Sig: Inhale 1-2 puffs into the lungs every 6 (six) hours as needed for wheezing or shortness of breath.    Dispense:  8 g    Refill:  2    There are no discontinued medications.  Follow-up: Return in about 3 months (around 05/03/2021).   Sherlene Shams, MD

## 2021-02-01 NOTE — Patient Instructions (Addendum)
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

## 2021-02-02 LAB — LIPID PANEL
Cholesterol: 114 mg/dL (ref 0–200)
HDL: 30.7 mg/dL — ABNORMAL LOW (ref >39.00)
LDL Cholesterol: 59 mg/dL (ref 0–99)
NonHDL: 83.17
Total CHOL/HDL Ratio: 4
Triglycerides: 122 mg/dL (ref 0.0–149.0)
VLDL: 24.4 mg/dL (ref 0.0–40.0)

## 2021-02-04 DIAGNOSIS — J439 Emphysema, unspecified: Secondary | ICD-10-CM | POA: Insufficient documentation

## 2021-02-04 MED ORDER — HYDROCODONE-ACETAMINOPHEN 5-325 MG PO TABS
1.0000 | ORAL_TABLET | Freq: Two times a day (BID) | ORAL | 0 refills | Status: DC | PRN
Start: 2021-03-28 — End: 2021-05-03

## 2021-02-04 MED ORDER — HYDROCODONE-ACETAMINOPHEN 5-325 MG PO TABS: 1.0000 | ORAL_TABLET | Freq: Two times a day (BID) | ORAL | 0 refills | Status: DC | PRN

## 2021-02-04 NOTE — Assessment & Plan Note (Signed)
pain was not controlled with Celebrex and it raised her blood pressure.  Tylenol #3 was tolerated while I n [place] but did not manage her pain  .  She will resume  use of vicodin TWO TIMES DAILY  and continue tramadol for  daytime use.   Refill history confirmed via [place] Controlled Substance databas, accessed by me today.  Refills authorized  FOR 3 months .

## 2021-02-04 NOTE — Assessment & Plan Note (Signed)
Involving bilateral lung apices by prior CT.  She is asymptomatic but continues to smoke.  Advice given.  MDI refilled

## 2021-02-04 NOTE — Assessment & Plan Note (Addendum)
Asymptomatic despite critical stenosis and diagnosis of chronic mesenteric ischemia. Continue statin. Losartan stopped by patient  due to hypotension.  Advised to quit smoking. Annual follow up with vascular surgeon . ?

## 2021-02-04 NOTE — Assessment & Plan Note (Addendum)
GFR has finally normalized with suspension of celebrex  Lab Results  Component Value Date   CREATININE 0.74 01/04/2021

## 2021-02-04 NOTE — Assessment & Plan Note (Signed)
Unchanged by serial u/s dopplers done annually by vascular surgery.  She was taking losartan until recently for hypertension;  Medication was discontinued due to symptomatic hypotension..  Advised to monitor BP at home frequently with plans to resume losartan for sbp>130/80

## 2021-02-19 ENCOUNTER — Other Ambulatory Visit: Payer: Self-pay | Admitting: Internal Medicine

## 2021-03-06 DIAGNOSIS — N951 Menopausal and female climacteric states: Secondary | ICD-10-CM | POA: Diagnosis not present

## 2021-03-08 ENCOUNTER — Ambulatory Visit: Payer: PPO | Admitting: Family Medicine

## 2021-03-14 DIAGNOSIS — R232 Flushing: Secondary | ICD-10-CM | POA: Diagnosis not present

## 2021-03-14 DIAGNOSIS — N951 Menopausal and female climacteric states: Secondary | ICD-10-CM | POA: Diagnosis not present

## 2021-03-14 DIAGNOSIS — M2550 Pain in unspecified joint: Secondary | ICD-10-CM | POA: Diagnosis not present

## 2021-03-14 DIAGNOSIS — Z6826 Body mass index (BMI) 26.0-26.9, adult: Secondary | ICD-10-CM | POA: Diagnosis not present

## 2021-03-23 ENCOUNTER — Other Ambulatory Visit: Payer: Self-pay | Admitting: Internal Medicine

## 2021-04-14 ENCOUNTER — Ambulatory Visit: Payer: PPO | Admitting: Family Medicine

## 2021-04-20 ENCOUNTER — Other Ambulatory Visit: Payer: Self-pay | Admitting: Internal Medicine

## 2021-05-03 ENCOUNTER — Other Ambulatory Visit: Payer: Self-pay

## 2021-05-03 ENCOUNTER — Ambulatory Visit (INDEPENDENT_AMBULATORY_CARE_PROVIDER_SITE_OTHER): Payer: PPO | Admitting: Internal Medicine

## 2021-05-03 ENCOUNTER — Encounter: Payer: Self-pay | Admitting: Internal Medicine

## 2021-05-03 VITALS — BP 120/80 | HR 78 | Temp 96.7°F | Ht 63.0 in | Wt 157.4 lb

## 2021-05-03 DIAGNOSIS — D561 Beta thalassemia: Secondary | ICD-10-CM | POA: Diagnosis not present

## 2021-05-03 DIAGNOSIS — Z1231 Encounter for screening mammogram for malignant neoplasm of breast: Secondary | ICD-10-CM

## 2021-05-03 DIAGNOSIS — I129 Hypertensive chronic kidney disease with stage 1 through stage 4 chronic kidney disease, or unspecified chronic kidney disease: Secondary | ICD-10-CM | POA: Diagnosis not present

## 2021-05-03 DIAGNOSIS — M48061 Spinal stenosis, lumbar region without neurogenic claudication: Secondary | ICD-10-CM

## 2021-05-03 DIAGNOSIS — R002 Palpitations: Secondary | ICD-10-CM

## 2021-05-03 DIAGNOSIS — M19011 Primary osteoarthritis, right shoulder: Secondary | ICD-10-CM | POA: Diagnosis not present

## 2021-05-03 DIAGNOSIS — M25511 Pain in right shoulder: Secondary | ICD-10-CM

## 2021-05-03 DIAGNOSIS — K862 Cyst of pancreas: Secondary | ICD-10-CM

## 2021-05-03 DIAGNOSIS — M19012 Primary osteoarthritis, left shoulder: Secondary | ICD-10-CM

## 2021-05-03 MED ORDER — HYDROCODONE-ACETAMINOPHEN 5-325 MG PO TABS: 1.0000 | ORAL_TABLET | Freq: Two times a day (BID) | ORAL | 0 refills | Status: DC | PRN

## 2021-05-03 MED ORDER — TRAMADOL HCL 50 MG PO TABS: 50.0000 mg | ORAL_TABLET | Freq: Two times a day (BID) | ORAL | 5 refills | Status: DC | PRN

## 2021-05-03 MED ORDER — PREDNISONE 10 MG PO TABS: ORAL_TABLET | ORAL | 0 refills | Status: DC

## 2021-05-03 NOTE — Assessment & Plan Note (Signed)
Not seen on march 2017 CT angiogram

## 2021-05-03 NOTE — Assessment & Plan Note (Signed)
Now with bursitis secondary to overuse.  prednisone taper prescribed

## 2021-05-03 NOTE — Assessment & Plan Note (Signed)
Reminded her of need to modify activities to avoid aggravation of condition  Given use of chronic opiate therapy

## 2021-05-03 NOTE — Patient Instructions (Addendum)
I have prescribed a Prednisone taper for the bursitis in  your right shoulder  Please schedule your "[place] to [employer] visit " before your next birthday and have labs done PRIOR TO VISIT so hey can be reviewed at visit   REfills on tramadol and hydrocodone have been sent to local pharmacy for Dec, jan and February

## 2021-05-03 NOTE — Progress Notes (Signed)
Subjective:  Patient ID: Cheryl [name], female    DOB: 05-12-1956  Age: 65 y.o. MRN: [id number]  CC: The primary encounter diagnosis was Breast cancer screening by mammogram. Diagnoses of Pancreatic cyst, Beta thalassemia ([place]), Osteoarthritis of both shoulders, Renal hypertension, Palpitations, Acute pain of right shoulder, and Lumbar stenosis without neurogenic claudication were also pertinent to this visit.  HPI Allaura Cothran presents for  Chief Complaint  Patient presents with   Follow-up    Medication refill   This visit occurred during the SARS-CoV-2 public health emergency.  Safety protocols were in place, including screening questions prior to the visit, additional usage of staff PPE, and extensive cleaning of exam room while observing appropriate contact time as indicated for disinfecting solutions.   Hypertension: patient checks blood pressure twice weekly at home.  Readings have been for the most part  < 140/80 at rest . Patient is following a reduce salt diet most days and is taking medications as prescribed   Lumbar spinal stenosis :  managed with tramadol and hydrocodone.  Recnetly aggravated her back pain and shoulder pain by working in the yard too much digging up a small tree and planting 300 bulbs.  .  Right  Shoulder  and trapezius muscle has been hurting a lot , causing headaches.  Last  hydrocodone refill nov 10 and   Retired now,  quilting a lot with 2 organizations.       Outpatient Medications Prior to Visit  Medication Sig Dispense Refill   albuterol (PROAIR HFA) 108 (90 Base) MCG/ACT inhaler Inhale 1-2 puffs into the lungs every 6 (six) hours as needed for wheezing or shortness of breath. 8 g 2   aspirin EC 81 MG tablet Take 1 tablet (81 mg total) by mouth daily.     buPROPion (WELLBUTRIN SR) 100 MG 12 hr tablet Take 1 tablet (100 mg total) by mouth 2 (two) times daily. 180 tablet 3   Calcium Carbonate-Vit D-Min (CALCIUM 1200 PO) Take 1 tablet by  mouth daily.     gabapentin (NEURONTIN) 300 MG capsule Take 1 capsule (300 mg total) by mouth 3 (three) times daily. 270 capsule 3   HYDROcodone-acetaminophen (NORCO/VICODIN) 5-325 MG tablet Take 1 tablet by mouth 2 (two) times daily as needed. 60 tablet 0   metoprolol succinate (TOPROL-XL) 25 MG 24 hr tablet Take 1 tablet (25 mg total) by mouth daily. 90 tablet 1   nitroGLYCERIN (NITROSTAT) 0.4 MG SL tablet DISSOLVE 1 TABLET UNDER THE TONGUE EVERY 5 MINUTES AS  NEEDED FOR CHEST PAIN. MAX  OF 3 TABLETS IN 15 MINUTES. CALL 911 IF PAIN PERSISTS. 100 tablet 3   progesterone (PROMETRIUM) 200 MG capsule Take 200 mg by mouth at bedtime.     propranolol (INDERAL) 10 MG tablet TAKE 1 TABLET BY MOUTH 3  TIMES DAILY AS NEEDED 180 tablet 5   rOPINIRole (REQUIP) 0.5 MG tablet TAKE 1 TABLET(0.5 MG) BY MOUTH THREE TIMES DAILY 90 tablet 1   rosuvastatin (CRESTOR) 10 MG tablet TAKE 1 TABLET BY MOUTH  DAILY 90 tablet 3   venlafaxine XR (EFFEXOR-XR) 75 MG 24 hr capsule TAKE 1 CAPSULE(75 MG) BY MOUTH DAILY WITH BREAKFAST 90 capsule 3   vitamin C (ASCORBIC ACID) 500 MG tablet Take 500 mg by mouth daily.     HYDROcodone-acetaminophen (NORCO/VICODIN) 5-325 MG tablet Take 1 tablet by mouth 2 (two) times daily as needed. 60 tablet 0   traMADol (ULTRAM) 50 MG tablet Take 1 tablet (50 mg  total) by mouth 2 (two) times daily as needed. for pain 60 tablet 5   No facility-administered medications prior to visit.    Review of Systems;  Patient denies headache, fevers, malaise, unintentional weight loss, skin rash, eye pain, sinus congestion and sinus pain, sore throat, dysphagia,  hemoptysis , cough, dyspnea, wheezing, chest pain, palpitations, orthopnea, edema, abdominal pain, nausea, melena, diarrhea, constipation, flank pain, dysuria, hematuria, urinary  Frequency, nocturia, numbness, tingling, seizures,  Focal weakness, Loss of consciousness,  Tremor, insomnia, depression, anxiety, and suicidal ideation.      Objective:   BP 120/80 (BP Location: Left Arm, Patient Position: Sitting, Cuff Size: Normal)   Pulse 78   Temp (!) 96.7 F (35.9 C) (Temporal)   Ht 5\' 3"  (1.6 m)   Wt 157 lb 6.4 oz (71.4 kg)   SpO2 97%   BMI 27.88 kg/m   BP Readings from Last 3 Encounters:  05/03/21 120/80  02/01/21 132/72  01/11/21 122/86    Wt Readings from Last 3 Encounters:  05/03/21 157 lb 6.4 oz (71.4 kg)  02/01/21 152 lb 3.2 oz (69 kg)  01/11/21 152 lb 9.6 oz (69.2 kg)    General appearance: alert, cooperative and appears stated age Ears: normal TM's and external ear canals both ears Throat: lips, mucosa, and tongue normal; teeth and gums normal Neck: no adenopathy, no carotid bruit, supple, symmetrical, trachea midline and thyroid not enlarged, symmetric, no tenderness/mass/nodules Back: symmetric, no curvature. ROM normal. No CVA tenderness. Lungs: clear to auscultation bilaterally Heart: regular rate and rhythm, S1, S2 normal, no murmur, click, rub or gallop Abdomen: soft, non-tender; bowel sounds normal; no masses,  no organomegaly Pulses: 2+ and symmetric Skin: Skin color, texture, turgor normal. No rashes or lesions Lymph nodes: Cervical, supraclavicular, and axillary nodes normal.  Lab Results  Component Value Date   HGBA1C 5.1 10/26/2015   HGBA1C 5.4 11/25/2013    Lab Results  Component Value Date   CREATININE 0.74 01/04/2021   CREATININE 0.90 07/08/2020   CREATININE 1.02 (H) 05/23/2020    Lab Results  Component Value Date   WBC 9.8 01/04/2021   HGB 10.4 (L) 01/04/2021   HCT 33.2 (L) 01/04/2021   PLT 231 01/04/2021   GLUCOSE 96 01/04/2021   CHOL 114 02/01/2021   TRIG 122.0 02/01/2021   HDL 30.70 (L) 02/01/2021   LDLDIRECT 104 (H) 08/15/2012   LDLCALC 59 02/01/2021   ALT 15 07/08/2020   AST 16 07/08/2020   NA 134 (L) 01/04/2021   K 4.3 01/04/2021   CL 104 01/04/2021   CREATININE 0.74 01/04/2021   BUN 15 01/04/2021   CO2 24 01/04/2021   TSH 1.473 01/29/2019   INR 1.0 09/01/2014    HGBA1C 5.1 10/26/2015    MM 3D SCREEN BREAST BILATERAL  Result Date: 03/29/2020 CLINICAL DATA:  Screening. EXAM: DIGITAL SCREENING BILATERAL MAMMOGRAM WITH TOMO AND CAD COMPARISON:  Previous exam(s). ACR Breast Density Category c: The breast tissue is heterogeneously dense, which may obscure small masses. FINDINGS: There are no findings suspicious for malignancy. Images were processed with CAD. IMPRESSION: No mammographic evidence of malignancy. A result letter of this screening mammogram will be mailed directly to the patient. RECOMMENDATION: Screening mammogram in one year. (Code:SM-B-01Y) BI-RADS CATEGORY  1: Negative. Electronically Signed   By: Dorise Bullion III M.D   On: 03/29/2020 13:01    Assessment & Plan:   Problem List Items Addressed This Visit     Right shoulder pain    Now with  bursitis secondary to overuse.  prednisone taper prescribed       Renal hypertension    Well continued with  losartan . Cr and lytes unchanged  Lab Results  Component Value Date   CREATININE 0.74 01/04/2021   Lab Results  Component Value Date   NA 134 (L) 01/04/2021   K 4.3 01/04/2021   CL 104 01/04/2021   CO2 24 01/04/2021         Relevant Orders   Lipid panel   Comprehensive metabolic panel   Pancreatic cyst    Not seen on march 2017 CT angiogram       Palpitations   Relevant Orders   TSH   Osteoarthritis of both shoulders   Relevant Medications   predniSONE (DELTASONE) 10 MG tablet   traMADol (ULTRAM) 50 MG tablet   HYDROcodone-acetaminophen (NORCO/VICODIN) 5-325 MG tablet   HYDROcodone-acetaminophen (NORCO/VICODIN) 5-325 MG tablet   HYDROcodone-acetaminophen (NORCO/VICODIN) 5-325 MG tablet   Lumbar stenosis without neurogenic claudication    Reminded her of need to modify activities to avoid aggravation of condition  Given use of chronic opiate therapy      Relevant Medications   traMADol (ULTRAM) 50 MG tablet   HYDROcodone-acetaminophen (NORCO/VICODIN) 5-325 MG  tablet   HYDROcodone-acetaminophen (NORCO/VICODIN) 5-325 MG tablet   HYDROcodone-acetaminophen (NORCO/VICODIN) 5-325 MG tablet   Beta thalassemia (HCC)   Other Visit Diagnoses     Breast cancer screening by mammogram    -  Primary   Relevant Orders   MM 3D SCREEN BREAST BILATERAL       I am having Samara Deist A. Zappone "[name]" start on predniSONE. I am also having her maintain her Calcium Carbonate-Vit D-Min (CALCIUM 1200 PO), vitamin C, aspirin EC, progesterone, nitroGLYCERIN, propranolol, venlafaxine XR, buPROPion ER, rosuvastatin, metoprolol succinate, gabapentin, albuterol, HYDROcodone-acetaminophen, rOPINIRole, traMADol, HYDROcodone-acetaminophen, HYDROcodone-acetaminophen, and HYDROcodone-acetaminophen.  Meds ordered this encounter  Medications   predniSONE (DELTASONE) 10 MG tablet    Sig: 6 tablets on Day 1 , then reduce by 1 tablet daily until gone    Dispense:  21 tablet    Refill:  0   traMADol (ULTRAM) 50 MG tablet    Sig: Take 1 tablet (50 mg total) by mouth 2 (two) times daily as needed. for pain    Dispense:  60 tablet    Refill:  5   HYDROcodone-acetaminophen (NORCO/VICODIN) 5-325 MG tablet    Sig: Take 1 tablet by mouth 2 (two) times daily as needed.    Dispense:  60 tablet    Refill:  0    Do not refill less than 30 days from prior refill   HYDROcodone-acetaminophen (NORCO/VICODIN) 5-325 MG tablet    Sig: Take 1 tablet by mouth 2 (two) times daily as needed.    Dispense:  60 tablet    Refill:  0    Do not refill less than 30 days from prior refill   HYDROcodone-acetaminophen (NORCO/VICODIN) 5-325 MG tablet    Sig: Take 1 tablet by mouth 2 (two) times daily as needed.    Dispense:  60 tablet    Refill:  0    Do not refill less than 30 days from prior refill     I provided  30 minutes of  face-to-face time during this encounter reviewing patient's current problems and past surgeries, labs and imaging studies, providing counseling on the above mentioned problems  , and coordination  of care .   Follow-up: No follow-ups on file.   Sherlene Shams, MD

## 2021-05-03 NOTE — Assessment & Plan Note (Signed)
Well continued with  losartan . Cr and lytes unchanged  Lab Results  Component Value Date   CREATININE 0.74 01/04/2021   Lab Results  Component Value Date   NA 134 (L) 01/04/2021   K 4.3 01/04/2021   CL 104 01/04/2021   CO2 24 01/04/2021

## 2021-05-11 ENCOUNTER — Ambulatory Visit: Payer: PPO

## 2021-05-16 NOTE — Progress Notes (Deleted)
Cheryl [name] [address] [place] Phone: [phone] Subjective:    I'm seeing this patient by the request  of:  Crecencio Mc, MD  CC:   [id number]  Cheryl [name] is a 64 y.o. female coming in with complaint of back and neck pain. OMT on 12/29/2020. Patient states   Medications patient has been prescribed: Gabapentin  Taking:         Reviewed prior external information including notes and imaging from previsou exam, outside providers and external EMR if available.   As well as notes that were available from care everywhere and other healthcare systems.  Past medical history, social, surgical and family history all reviewed in electronic medical record.  No pertanent information unless stated regarding to the chief complaint.   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   Arrhythmia    left bundle branch block   Arthritis    Blood in stool    Chicken pox    Generalized headaches    History of blood transfusion    Kidney disease, chronic, stage III (GFR 30-59 ml/min) (HCC)    LBBB (left bundle branch block)    Renal disorder    Thyroid disease    UTI (lower urinary tract infection)     Allergies  Allergen Reactions   Peanuts [Peanut Oil]    Penicillins Other (See Comments)    Hives,rash,nausea,swelling,    Sulfa Antibiotics Other (See Comments)    Hives,rash,nausea,swelling   Influenza Vac Split [Influenza Virus Vaccine]     Pleurisy  1996   Mobic [Meloxicam] Nausea Only    Renal insufficiency   Nickel    Nsaids Other (See Comments)    Decreased gfr    Prednisone Rash     Review of Systems:  No headache, visual changes, nausea, vomiting, diarrhea, constipation, dizziness, abdominal pain, skin rash, fevers, chills, night sweats,  weight loss, swollen lymph nodes, body aches, joint swelling, chest pain, shortness of breath, mood changes. POSITIVE muscle aches  Objective  There were no vitals taken for this visit.   General: No apparent distress alert and oriented x3 mood and affect normal, dressed appropriately.  HEENT: Pupils equal, extraocular movements intact  Respiratory: Patient's speak in full sentences and does not appear short of breath  Cardiovascular: No lower extremity edema, non tender, no erythema  Neuro: Cranial nerves II through XII are intact, neurovascularly intact in all extremities with 2+ DTRs and 2+ pulses.  Gait normal with good balance and coordination.  MSK:  Non tender with full range of motion and good stability and symmetric strength and tone of shoulders, elbows, wrist, hip, knee and ankles bilaterally.  Back - Normal skin, Spine with normal alignment and no deformity.  No tenderness to vertebral process palpation.  Paraspinous muscles are not tender and without spasm.   Range of motion is full at neck and lumbar sacral regions  Osteopathic findings  C2 flexed rotated and side bent right C6 flexed rotated and side bent left T3 extended rotated and side bent right inhaled rib T9 extended rotated and side bent left L2 flexed rotated and side bent right Sacrum right on right       Assessment and Plan:    Nonallopathic problems  Decision today to treat with OMT was based on Physical Exam  After verbal consent patient was treated with HVLA, ME, FPR techniques in cervical, rib, thoracic, lumbar, and sacral  areas  Patient tolerated the procedure well with improvement in symptoms  Patient given exercises, stretches and lifestyle modifications  See medications in patient instructions if given  Patient will follow up in 4-8 weeks      The above documentation has been reviewed and is accurate and complete Doristine Bosworth       Note: This dictation was prepared with Nurse, children's  dictation along with smaller Lobbyist. Any transcriptional errors that result from this process are unintentional.

## 2021-05-17 ENCOUNTER — Ambulatory Visit: Payer: PPO | Admitting: Family Medicine

## 2021-05-17 ENCOUNTER — Ambulatory Visit: Payer: PPO

## 2021-05-18 ENCOUNTER — Encounter: Payer: Self-pay | Admitting: Internal Medicine

## 2021-05-19 ENCOUNTER — Other Ambulatory Visit: Payer: Self-pay | Admitting: Internal Medicine

## 2021-05-19 DIAGNOSIS — Z780 Asymptomatic menopausal state: Secondary | ICD-10-CM

## 2021-05-24 ENCOUNTER — Other Ambulatory Visit: Payer: Self-pay

## 2021-05-24 MED ORDER — METOPROLOL SUCCINATE ER 25 MG PO TB24: 25.0000 mg | ORAL_TABLET | Freq: Every day | ORAL | 1 refills | Status: DC

## 2021-05-26 ENCOUNTER — Other Ambulatory Visit: Payer: Self-pay | Admitting: Internal Medicine

## 2021-06-02 DIAGNOSIS — F17210 Nicotine dependence, cigarettes, uncomplicated: Secondary | ICD-10-CM | POA: Diagnosis not present

## 2021-06-02 DIAGNOSIS — N183 Chronic kidney disease, stage 3 unspecified: Secondary | ICD-10-CM | POA: Diagnosis not present

## 2021-06-02 DIAGNOSIS — I739 Peripheral vascular disease, unspecified: Secondary | ICD-10-CM | POA: Diagnosis not present

## 2021-06-02 DIAGNOSIS — E663 Overweight: Secondary | ICD-10-CM | POA: Diagnosis not present

## 2021-06-02 DIAGNOSIS — I774 Celiac artery compression syndrome: Secondary | ICD-10-CM | POA: Diagnosis not present

## 2021-06-02 DIAGNOSIS — E785 Hyperlipidemia, unspecified: Secondary | ICD-10-CM | POA: Diagnosis not present

## 2021-06-02 DIAGNOSIS — G2581 Restless legs syndrome: Secondary | ICD-10-CM | POA: Diagnosis not present

## 2021-06-02 DIAGNOSIS — J439 Emphysema, unspecified: Secondary | ICD-10-CM | POA: Diagnosis not present

## 2021-06-02 DIAGNOSIS — G8929 Other chronic pain: Secondary | ICD-10-CM | POA: Diagnosis not present

## 2021-06-02 DIAGNOSIS — D561 Beta thalassemia: Secondary | ICD-10-CM | POA: Diagnosis not present

## 2021-06-02 DIAGNOSIS — F3341 Major depressive disorder, recurrent, in partial remission: Secondary | ICD-10-CM | POA: Diagnosis not present

## 2021-06-02 DIAGNOSIS — G4733 Obstructive sleep apnea (adult) (pediatric): Secondary | ICD-10-CM | POA: Diagnosis not present

## 2021-06-07 NOTE — Progress Notes (Signed)
Cheryl [name] Phone: [phone] Subjective:   [name], am serving as a scribe for Dr. Hulan Saas. This visit occurred during the SARS-CoV-2 public health emergency.  Safety protocols were in place, including screening questions prior to the visit, additional usage of staff PPE, and extensive cleaning of exam room while observing appropriate contact time as indicated for disinfecting solutions.  I'm seeing this patient by the request  of:  Crecencio Mc, MD  CC: Neck and shoulder pain  [id number]  Korinna Bumgardner is a 66 y.o. female coming in with complaint of back and neck pain. OMT 12/29/2020. Patient states that her neck pain is giving her headaches. Pain in R side of neck that radiates into head. Feels a pulling in pec muscle. Pain with flexion and horizontal abduction. Has been working out 3x a week at [employer]. States that after Thanksgiving she was pulling out a large root from the ground and has had more pain since.   Medications patient has been prescribed: Gabapentin  Taking: Yes         Reviewed prior external information including notes and imaging from previsou exam, outside providers and external EMR if available.   As well as notes that were available from care everywhere and other healthcare systems.  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  Patient is to continue iron and B12 supplementation.  Past medical history, social, surgical and family history all reviewed in electronic medical record.  No pertanent information unless stated regarding to the chief complaint.   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   Arrhythmia    left bundle branch block   Arthritis    Blood in stool    Chicken  pox    Generalized headaches    History of blood transfusion    Kidney disease, chronic, stage III (GFR 30-59 ml/min) (HCC)    LBBB (left bundle branch block)    Renal disorder    Thyroid disease    UTI (lower urinary tract infection)     Allergies  Allergen Reactions   Peanuts [Peanut Oil]    Penicillins Other (See Comments)    Hives,rash,nausea,swelling,    Sulfa Antibiotics Other (See Comments)    Hives,rash,nausea,swelling   Influenza Vac Split [Influenza Virus Vaccine]     Pleurisy  1996   Mobic [Meloxicam] Nausea Only    Renal insufficiency   Nickel    Nsaids Other (See Comments)    Decreased gfr    Prednisone Rash     Review of Systems:  No headache, visual changes, nausea, vomiting, diarrhea, constipation, dizziness, abdominal pain, skin rash, fevers, chills, night sweats, weight loss, swollen lymph nodes, body aches, joint swelling, chest pain, shortness of breath, mood changes. POSITIVE muscle aches  Objective  Blood pressure 116/70, pulse 77, height 5\' 3"  (1.6 m), weight 153 lb (69.4 kg), SpO2 97 %.   General: No apparent distress alert and oriented x3 mood and affect normal, dressed appropriately.  HEENT: Pupils equal, extraocular movements intact  Respiratory: Patient's speak in full sentences and does not appear short of breath  Cardiovascular: No lower extremity edema, non tender, no erythema  Neck exam does have some mild tightness noted.  More tightness on the parascapular region right greater than left.  Multiple trigger points noted in the trapezius area.  5-5 strength of the upper extremity.  Very mild impingement noted of the right shoulder.  Osteopathic findings  C2 flexed rotated and side bent right C6 flexed rotated and side bent left T3 extended rotated and side bent right inhaled rib significant slipped rib noted T9 extended rotated and side bent left L2 flexed rotated and side bent right Sacrum right on right       Assessment and  Plan:  Cervical radiculopathy Patient is a tightness on the right side.  Seems to be more trapezius.  Seems to have more of a scapular dyskinesis as well.  Patient continues to stay active.  Discussed posture and ergonomics.  Given some exercises that I think will be helpful.  X-rays of the neck for further ordered.  Patient has had an MRI previously that was independently visualized by me and did show left-sided nerve root impingements but never on the right.  We will see if anything is changed over the course of the 3 years.  Follow-up with me again in 6 to 8 weeks.    Nonallopathic problems  Decision today to treat with OMT was based on Physical Exam  After verbal consent patient was treated with HVLA, ME, FPR techniques in cervical, rib, thoracic, lumbar, and sacral  areas  Patient tolerated the procedure well with improvement in symptoms  Patient given exercises, stretches and lifestyle modifications  See medications in patient instructions if given  Patient will follow up in 4-8 weeks      [employer]        Note: This dictation was prepared with Dragon dictation along with smaller phrase technology. Any transcriptional errors that result from this process are unintentional.

## 2021-06-08 ENCOUNTER — Other Ambulatory Visit: Payer: Self-pay

## 2021-06-08 ENCOUNTER — Encounter: Payer: Self-pay | Admitting: Family Medicine

## 2021-06-08 ENCOUNTER — Ambulatory Visit: Payer: PPO | Admitting: Family Medicine

## 2021-06-08 ENCOUNTER — Ambulatory Visit (INDEPENDENT_AMBULATORY_CARE_PROVIDER_SITE_OTHER): Payer: PPO

## 2021-06-08 VITALS — BP 116/70 | HR 77 | Ht 63.0 in | Wt 153.0 lb

## 2021-06-08 DIAGNOSIS — M5412 Radiculopathy, cervical region: Secondary | ICD-10-CM | POA: Diagnosis not present

## 2021-06-08 DIAGNOSIS — M542 Cervicalgia: Secondary | ICD-10-CM

## 2021-06-08 DIAGNOSIS — M9901 Segmental and somatic dysfunction of cervical region: Secondary | ICD-10-CM

## 2021-06-08 DIAGNOSIS — M9903 Segmental and somatic dysfunction of lumbar region: Secondary | ICD-10-CM

## 2021-06-08 DIAGNOSIS — M9904 Segmental and somatic dysfunction of sacral region: Secondary | ICD-10-CM

## 2021-06-08 DIAGNOSIS — M9902 Segmental and somatic dysfunction of thoracic region: Secondary | ICD-10-CM

## 2021-06-08 DIAGNOSIS — M9908 Segmental and somatic dysfunction of rib cage: Secondary | ICD-10-CM | POA: Diagnosis not present

## 2021-06-08 NOTE — Patient Instructions (Addendum)
Xray neck  Exercises See me again in 5-6 weeks

## 2021-06-08 NOTE — Assessment & Plan Note (Signed)
Patient is a tightness on the right side.  Seems to be more trapezius.  Seems to have more of a scapular dyskinesis as well.  Patient continues to stay active.  Discussed posture and ergonomics.  Given some exercises that I think will be helpful.  X-rays of the neck for further ordered.  Patient has had an MRI previously that was independently visualized by me and did show left-sided nerve root impingements but never on the right.  We will see if anything is changed over the course of the 3 years.  Follow-up with me again in 6 to 8 weeks.

## 2021-06-19 DIAGNOSIS — N951 Menopausal and female climacteric states: Secondary | ICD-10-CM | POA: Diagnosis not present

## 2021-06-21 ENCOUNTER — Other Ambulatory Visit: Payer: Self-pay | Admitting: Internal Medicine

## 2021-06-26 DIAGNOSIS — N951 Menopausal and female climacteric states: Secondary | ICD-10-CM | POA: Diagnosis not present

## 2021-06-26 DIAGNOSIS — Z6827 Body mass index (BMI) 27.0-27.9, adult: Secondary | ICD-10-CM | POA: Diagnosis not present

## 2021-06-26 DIAGNOSIS — R232 Flushing: Secondary | ICD-10-CM | POA: Diagnosis not present

## 2021-06-26 DIAGNOSIS — R5383 Other fatigue: Secondary | ICD-10-CM | POA: Diagnosis not present

## 2021-06-27 ENCOUNTER — Other Ambulatory Visit: Payer: Self-pay

## 2021-06-27 MED ORDER — ROSUVASTATIN CALCIUM 10 MG PO TABS: 10.0000 mg | ORAL_TABLET | Freq: Every day | ORAL | 3 refills | Status: DC

## 2021-06-30 ENCOUNTER — Ambulatory Visit: Payer: PPO

## 2021-07-04 ENCOUNTER — Other Ambulatory Visit: Payer: Self-pay | Admitting: Internal Medicine

## 2021-07-04 ENCOUNTER — Ambulatory Visit (INDEPENDENT_AMBULATORY_CARE_PROVIDER_SITE_OTHER): Payer: PPO

## 2021-07-04 VITALS — Ht 63.0 in | Wt 153.0 lb

## 2021-07-04 DIAGNOSIS — M19011 Primary osteoarthritis, right shoulder: Secondary | ICD-10-CM

## 2021-07-04 DIAGNOSIS — Z0000 Encounter for general adult medical examination without abnormal findings: Secondary | ICD-10-CM

## 2021-07-04 DIAGNOSIS — M19012 Primary osteoarthritis, left shoulder: Secondary | ICD-10-CM

## 2021-07-04 MED ORDER — HYDROCODONE-ACETAMINOPHEN 5-325 MG PO TABS: 1.0000 | ORAL_TABLET | Freq: Two times a day (BID) | ORAL | 0 refills | Status: DC | PRN

## 2021-07-04 NOTE — Patient Instructions (Addendum)
Cheryl [name] , Thank you for taking time to come for your Medicare Wellness Visit. I appreciate your ongoing commitment to your health goals. Please review the following plan we discussed and let me know if I can assist you in the future.   These are the goals we discussed:  Goals      Quit Smoking     Decrease amount of tobacco use until able to stop        This is a list of the screening recommended for you and due dates:  Health Maintenance  Topic Date Due   Mammogram  03/28/2021   Flu Shot  08/25/2021*   Hepatitis C Screening: USPSTF Recommendation to screen - Ages 18-79 yo.  07/04/2022*   Colon Cancer Screening  11/20/2022   Tetanus Vaccine  07/27/2030   DEXA scan (bone density measurement)  Completed   HPV Vaccine  Aged Out   Pneumonia Vaccine  Discontinued   COVID-19 Vaccine  Discontinued   Zoster (Shingles) Vaccine  Discontinued  *Topic was postponed. The date shown is not the original due date.   Advanced directives: End of life planning; Advance aging; Advanced directives discussed.  Copy of current HCPOA/Living Will requested.    Conditions/risks identified: none new  Follow up in one year for your annual wellness visit    Preventive Care 65 Years and Older, Female Preventive care refers to lifestyle choices and visits with your health care provider that can promote health and wellness. What does preventive care include? A yearly physical exam. This is also called an annual well check. Dental exams once or twice a year. Routine eye exams. Ask your health care provider how often you should have your eyes checked. Personal lifestyle choices, including: Daily care of your teeth and gums. Regular physical activity. Eating a healthy diet. Avoiding tobacco and drug use. Limiting alcohol use. Practicing safe sex. Taking low-dose aspirin every day. Taking vitamin and mineral supplements as recommended by your health care provider. What happens during an annual  well check? The services and screenings done by your health care provider during your annual well check will depend on your age, overall health, lifestyle risk factors, and family history of disease. Counseling  Your health care provider may ask you questions about your: Alcohol use. Tobacco use. Drug use. Emotional well-being. Home and relationship well-being. Sexual activity. Eating habits. History of falls. Memory and ability to understand (cognition). Work and work Astronomer. Reproductive health. Screening  You may have the following tests or measurements: Height, weight, and BMI. Blood pressure. Lipid and cholesterol levels. These may be checked every 5 years, or more frequently if you are over 92 years old. Skin check. Lung cancer screening. You may have this screening every year starting at age 10 if you have a 30-pack-year history of smoking and currently smoke or have quit within the past 15 years. Fecal occult blood test (FOBT) of the stool. You may have this test every year starting at age 78. Flexible sigmoidoscopy or colonoscopy. You may have a sigmoidoscopy every 5 years or a colonoscopy every 10 years starting at age 57. Hepatitis C blood test. Hepatitis B blood test. Sexually transmitted disease (STD) testing. Diabetes screening. This is done by checking your blood sugar (glucose) after you have not eaten for a while (fasting). You may have this done every 1-3 years. Bone density scan. This is done to screen for osteoporosis. You may have this done starting at age 44. Mammogram. This may be done every  1-2 years. Talk to your health care provider about how often you should have regular mammograms. Talk with your health care provider about your test results, treatment options, and if necessary, the need for more tests. Vaccines  Your health care provider may recommend certain vaccines, such as: Influenza vaccine. This is recommended every year. Tetanus, diphtheria,  and acellular pertussis (Tdap, Td) vaccine. You may need a Td booster every 10 years. Zoster vaccine. You may need this after age 46. Pneumococcal 13-valent conjugate (PCV13) vaccine. One dose is recommended after age 78. Pneumococcal polysaccharide (PPSV23) vaccine. One dose is recommended after age 43. Talk to your health care provider about which screenings and vaccines you need and how often you need them. This information is not intended to replace advice given to you by your health care provider. Make sure you discuss any questions you have with your health care provider. Document Released: 06/10/2015 Document Revised: 02/01/2016 Document Reviewed: 03/15/2015 Elsevier Interactive Patient Education  2017 [employer].  Fall Prevention in the Home Falls can cause injuries. They can happen to people of all ages. There are many things you can do to make your home safe and to help prevent falls. What can I do on the outside of my home? Regularly fix the edges of walkways and driveways and fix any cracks. Remove anything that might make you trip as you walk through a door, such as a raised step or threshold. Trim any bushes or trees on the path to your home. Use bright outdoor lighting. Clear any walking paths of anything that might make someone trip, such as rocks or tools. Regularly check to see if handrails are loose or broken. Make sure that both sides of any steps have handrails. Any raised decks and porches should have guardrails on the edges. Have any leaves, snow, or ice cleared regularly. Use sand or salt on walking paths during winter. Clean up any spills in your garage right away. This includes oil or grease spills. What can I do in the bathroom? Use night lights. Install grab bars by the toilet and in the tub and shower. Do not use towel bars as grab bars. Use non-skid mats or decals in the tub or shower. If you need to sit down in the shower, use a plastic, non-slip  stool. Keep the floor dry. Clean up any water that spills on the floor as soon as it happens. Remove soap buildup in the tub or shower regularly. Attach bath mats securely with double-sided non-slip rug tape. Do not have throw rugs and other things on the floor that can make you trip. What can I do in the bedroom? Use night lights. Make sure that you have a light by your bed that is easy to reach. Do not use any sheets or blankets that are too big for your bed. They should not hang down onto the floor. Have a firm chair that has side arms. You can use this for support while you get dressed. Do not have throw rugs and other things on the floor that can make you trip. What can I do in the kitchen? Clean up any spills right away. Avoid walking on wet floors. Keep items that you use a lot in easy-to-reach places. If you need to reach something above you, use a strong step stool that has a grab bar. Keep electrical cords out of the way. Do not use floor polish or wax that makes floors slippery. If you must use wax, use non-skid  floor wax. Do not have throw rugs and other things on the floor that can make you trip. What can I do with my stairs? Do not leave any items on the stairs. Make sure that there are handrails on both sides of the stairs and use them. Fix handrails that are broken or loose. Make sure that handrails are as long as the stairways. Check any carpeting to make sure that it is firmly attached to the stairs. Fix any carpet that is loose or worn. Avoid having throw rugs at the top or bottom of the stairs. If you do have throw rugs, attach them to the floor with carpet tape. Make sure that you have a light switch at the top of the stairs and the bottom of the stairs. If you do not have them, ask someone to add them for you. What else can I do to help prevent falls? Wear shoes that: Do not have high heels. Have rubber bottoms. Are comfortable and fit you well. Are closed at the  toe. Do not wear sandals. If you use a stepladder: Make sure that it is fully opened. Do not climb a closed stepladder. Make sure that both sides of the stepladder are locked into place. Ask someone to hold it for you, if possible. Clearly mark and make sure that you can see: Any grab bars or handrails. First and last steps. Where the edge of each step is. Use tools that help you move around (mobility aids) if they are needed. These include: Canes. Walkers. Scooters. Crutches. Turn on the lights when you go into a dark area. Replace any light bulbs as soon as they burn out. Set up your furniture so you have a clear path. Avoid moving your furniture around. If any of your floors are uneven, fix them. If there are any pets around you, be aware of where they are. Review your medicines with your doctor. Some medicines can make you feel dizzy. This can increase your chance of falling. Ask your doctor what other things that you can do to help prevent falls. This information is not intended to replace advice given to you by your health care provider. Make sure you discuss any questions you have with your health care provider. Document Released: 03/10/2009 Document Revised: 10/20/2015 Document Reviewed: 06/18/2014 Elsevier Interactive Patient Education  2017 [employer]  Opioid Pain Medicine Management Opioids are powerful medicines that are used to treat moderate to severe pain. When used for short periods of time, they can help you to: Sleep better. Do better in physical or occupational therapy. Feel better in the first few days after an injury. Recover from surgery. Opioids should be taken with the supervision of a trained health care provider. They should be taken for the shortest period of time possible. This is because opioids can be addictive, and the longer you take opioids, the greater your risk of addiction. This addiction can also be called opioid use disorder. What are the  risks? Using opioid pain medicines for longer than 3 days increases your risk of side effects. Side effects include: Constipation. Nausea and vomiting. Breathing difficulties (respiratory depression). Drowsiness. Confusion. Opioid use disorder. Itching. Taking opioid pain medicine for a long period of time can affect your ability to do daily tasks. It also puts you at risk for: Motor vehicle crashes. Depression. Suicide. Heart attack. Overdose, which can be life-threatening. What is a pain treatment plan? A pain treatment plan is an agreement between you and your health care  provider. Pain is unique to each person, and treatments vary depending on your condition. To manage your pain, you and your health care provider need to work together. To help you do this: Discuss the goals of your treatment, including how much pain you might expect to have and how you will manage the pain. Review the risks and benefits of taking opioid medicines. Remember that a good treatment plan uses more than one approach and minimizes the chance of side effects. Be honest about the amount of medicines you take and about any drug or alcohol use. Get pain medicine prescriptions from only one health care provider. Pain can be managed with many types of alternative treatments. Ask your health care provider to refer you to one or more specialists who can help you manage pain through: Physical or occupational therapy. Counseling (cognitive behavioral therapy). Good nutrition. Biofeedback. Massage. Meditation. Non-opioid medicine. Following a gentle exercise program. How to use opioid pain medicine Taking medicine Take your pain medicine exactly as told by your health care provider. Take it only when you need it. If your pain gets less severe, you may take less than your prescribed dose if your health care provider approves. If you are not having pain, do nottake pain medicine unless your health care provider  tells you to take it. If your pain is severe, do nottry to treat it yourself by taking more pills than instructed on your prescription. Contact your health care provider for help. Write down the times when you take your pain medicine. It is easy to become confused while on pain medicine. Writing the time can help you avoid overdose. Take other over-the-counter or prescription medicines only as told by your health care provider. Keeping yourself and others safe  While you are taking opioid pain medicine: Do not drive, use machinery, or power tools. Do not sign legal documents. Do not drink alcohol. Do not take sleeping pills. Do not supervise children by yourself. Do not do activities that require climbing or being in high places. Do not go to a lake, river, ocean, spa, or swimming pool. Do not share your pain medicine with anyone. Keep pain medicine in a locked cabinet or in a secure area where pets and children cannot reach it. Stopping your use of opioids If you have been taking opioid medicine for more than a few weeks, you may need to slowly decrease (taper) how much you take until you stop completely. Tapering your use of opioids can decrease your risk of symptoms of withdrawal, such as: Pain and cramping in the abdomen. Nausea. Sweating. Sleepiness. Restlessness. Uncontrollable shaking (tremors). Cravings for the medicine. Do not attempt to taper your use of opioids on your own. Talk with your health care provider about how to do this. Your health care provider may prescribe a step-down schedule based on how much medicine you are taking and how long you have been taking it. Getting rid of leftover pills Do not save any leftover pills. Get rid of leftover pills safely by: Taking the medicine to a prescription take-back program. This is usually offered by the county or law enforcement. Bringing them to a pharmacy that has a drug disposal container. Flushing them down the toilet.  Check the label or package insert of your medicine to see whether this is safe to do. Throwing them out in the trash. Check the label or package insert of your medicine to see whether this is safe to do. If it is safe to throw it  out, remove the medicine from the original container, put it into a sealable bag or container, and mix it with used coffee grounds, food scraps, dirt, or cat litter before putting it in the trash. Follow these instructions at home: Activity Do exercises as told by your health care provider. Avoid activities that make your pain worse. Return to your normal activities as told by your health care provider. Ask your health care provider what activities are safe for you. General instructions You may need to take these actions to prevent or treat constipation: Drink enough fluid to keep your urine pale yellow. Take over-the-counter or prescription medicines. Eat foods that are high in fiber, such as beans, whole grains, and fresh fruits and vegetables. Limit foods that are high in fat and processed sugars, such as fried or sweet foods. Keep all follow-up visits. This is important. Where to find support If you have been taking opioids for a long time, you may benefit from receiving support for quitting from a local support group or counselor. Ask your health care provider for a referral to these resources in your area. Where to find more information Centers for Disease Control and Prevention (CDC): FootballExhibition.com.brwww.cdc.gov [place] Food and Drug Administration (FDA): PumpkinSearch.com.eewww.fda.gov Get help right away if: You may have taken too much of an opioid (overdosed). Common symptoms of an overdose: Your breathing is slower or more shallow than normal. You have a very slow heartbeat (pulse). You have slurred speech. You have nausea and vomiting. Your pupils become very small. You have other potential symptoms: You are very confused. You faint or feel like you will faint. You have cold, clammy  skin. You have blue lips or fingernails. You have thoughts of harming yourself or harming others. These symptoms may represent a serious problem that is an emergency. Do not wait to see if the symptoms will go away. Get medical help right away. Call your local emergency services (911 in the [place] Do not drive yourself to the hospital.  If you ever feel like you may hurt yourself or others, or have thoughts about taking your own life, get help right away. Go to your nearest emergency department or: Call your local emergency services (911 in the [place] Call the [facility]National Poison Control Center (36734935951-6032209065 in the [place] Call a suicide crisis helpline, such as the National Suicide Prevention Lifeline at 843-118-85811-9205529161 or 988 in the [place] This is open 24 hours a day in the [place] Text the Crisis Text Line at (629) 122-1544741741 (in the [place] Summary Opioid medicines can help you manage moderate to severe pain for a short period of time. A pain treatment plan is an agreement between you and your health care provider. Discuss the goals of your treatment, including how much pain you might expect to have and how you will manage the pain. If you think that you or someone else may have taken too much of an opioid, get medical help right away. This information is not intended to replace advice given to you by your health care provider. Make sure you discuss any questions you have with your health care provider. Document Revised: 12/07/2020 Document Reviewed: 08/24/2020 Elsevier Patient Education  2022 [employer]

## 2021-07-04 NOTE — Progress Notes (Addendum)
Subjective:   Cheryl [name] is a 66 y.o. female who presents for an Initial Medicare Annual Wellness Visit.  Review of Systems    No ROS.  Medicare Wellness Virtual Visit.  Visual/audio telehealth visit, UTA vital signs.   See social history for additional risk factors.   Cardiac Risk Factors include: advanced age (>2men, >28 women)     Objective:    Today's Vitals   07/04/21 1318  Weight: 153 lb (69.4 kg)  Height: 5\' 3"  (1.6 m)   Body mass index is 27.1 kg/m.  Advanced Directives 07/04/2021 01/11/2021 01/29/2019 05/31/2018 12/20/2017 06/21/2017 12/07/2016  Does Patient Have a Medical Advance Directive? Yes No No No Yes Yes No;Yes  Type of Paramedic of [place];Living will - - - - Living will;Healthcare Power of Attorney Living will;Healthcare Power of Attorney  Does patient want to make changes to medical advance directive? No - Patient declined No - Patient declined - - - No - Patient declined -  Copy of [place] in Chart? No - copy requested - - - - No - copy requested -  Would patient like information on creating a medical advance directive? - No - Patient declined No - Patient declined - - - -    Current Medications (verified) Outpatient Encounter Medications as of 07/04/2021  Medication Sig   albuterol (PROAIR HFA) 108 (90 Base) MCG/ACT inhaler Inhale 1-2 puffs into the lungs every 6 (six) hours as needed for wheezing or shortness of breath.   aspirin EC 81 MG tablet Take 1 tablet (81 mg total) by mouth daily.   buPROPion (WELLBUTRIN SR) 100 MG 12 hr tablet Take 1 tablet (100 mg total) by mouth 2 (two) times daily.   Calcium Carbonate-Vit D-Min (CALCIUM 1200 PO) Take 1 tablet by mouth daily.   gabapentin (NEURONTIN) 300 MG capsule Take 1 capsule (300 mg total) by mouth 3 (three) times daily.   HYDROcodone-acetaminophen (NORCO/VICODIN) 5-325 MG tablet Take 1 tablet by mouth 2 (two) times daily as needed.    HYDROcodone-acetaminophen (NORCO/VICODIN) 5-325 MG tablet Take 1 tablet by mouth 2 (two) times daily as needed.   HYDROcodone-acetaminophen (NORCO/VICODIN) 5-325 MG tablet Take 1 tablet by mouth 2 (two) times daily as needed.   HYDROcodone-acetaminophen (NORCO/VICODIN) 5-325 MG tablet Take 1 tablet by mouth 2 (two) times daily as needed.   metoprolol succinate (TOPROL-XL) 25 MG 24 hr tablet Take 1 tablet (25 mg total) by mouth daily.   nitroGLYCERIN (NITROSTAT) 0.4 MG SL tablet DISSOLVE 1 TABLET UNDER THE TONGUE EVERY 5 MINUTES AS  NEEDED FOR CHEST PAIN. MAX  OF 3 TABLETS IN 15 MINUTES. CALL 911 IF PAIN PERSISTS.   predniSONE (DELTASONE) 10 MG tablet 6 tablets on Day 1 , then reduce by 1 tablet daily until gone   progesterone (PROMETRIUM) 200 MG capsule Take 200 mg by mouth at bedtime.   propranolol (INDERAL) 10 MG tablet TAKE 1 TABLET BY MOUTH 3  TIMES DAILY AS NEEDED   rOPINIRole (REQUIP) 0.5 MG tablet TAKE 1 TABLET(0.5 MG) BY MOUTH THREE TIMES DAILY   rosuvastatin (CRESTOR) 10 MG tablet Take 1 tablet (10 mg total) by mouth daily.   traMADol (ULTRAM) 50 MG tablet Take 1 tablet (50 mg total) by mouth 2 (two) times daily as needed. for pain   venlafaxine XR (EFFEXOR-XR) 75 MG 24 hr capsule TAKE 1 CAPSULE(75 MG) BY MOUTH DAILY WITH BREAKFAST   vitamin C (ASCORBIC ACID) 500 MG tablet Take 500 mg by  mouth daily.   No facility-administered encounter medications on file as of 07/04/2021.    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   Arrhythmia    left bundle branch block   Arthritis    Blood in stool    Chicken pox    Generalized headaches     History of blood transfusion    Kidney disease, chronic, stage III (GFR 30-59 ml/min) (HCC)    LBBB (left bundle branch block)    Renal disorder    Thyroid disease    UTI (lower urinary tract infection)    Past Surgical History:  Procedure Laterality Date   [place]   BREAST EXCISIONAL BIOPSY Bilateral 1976   neg   [place]  2004   [facility]   CARDIAC CATHETERIZATION  2006   [facility]   cystic fibrosis tumor removal  1983   THUMB AMPUTATION  1992   traumatic   TONSILLECTOMY AND ADENOIDECTOMY  1964   Family History  Problem Relation Age of Onset   Heart disease Mother    Arthritis Mother    Cancer Mother        breast   Hyperlipidemia Mother    Hypertension Mother    Heart attack Mother    Breast cancer Mother 42   Heart disease Father    Diabetes Father    Arthritis Father    Hypertension Father    Parkinson's disease Father    Hodgkin's lymphoma Father        hodgkins disease, prostate   Heart disease Sister    Diabetes Sister    Heart disease Brother 30       ami x 8,  4 vessel CABG    Diabetes Brother    Liver disease Brother    Lung disease Brother    Heart disease Maternal Grandmother    Diabetes Maternal Grandmother    Heart disease Maternal Grandfather    Heart disease Paternal Grandmother    Diabetes Paternal Grandmother    Stroke Paternal Grandfather    Heart disease Paternal Grandfather    Diabetes Paternal Grandfather    Diabetes Maternal Aunt    Social History   Socioeconomic History   Marital status: Married    Spouse name: Not on file   Number of children: Not on file   Years of education: Not on file   Highest education level: Not on file  Occupational History   Not on file  Tobacco Use   Smoking status: Every Day    Packs/day: 0.25    Years: 34.00    Pack years: 8.50    Types: Cigarettes    Last attempt to quit: 04/10/2013    Years since quitting: 8.2    Smokeless tobacco: Never  Substance and Sexual Activity   Alcohol use: Yes    Comment: occasional   Drug use: No   Sexual activity: Not on file  Other Topics Concern   Not on file  Social History Narrative   Married to [employer]; Works for [employer], Agricultural engineer. Quit smoking 2018; ocassional alcohol; lives near to [place].    Social Determinants of Health  Financial Resource Strain: Low Risk    Difficulty of Paying Living Expenses: Not hard at all  Food Insecurity: No Food Insecurity   Worried About Charity fundraiser in the Last Year: Never true   Ran Out of Food in the Last Year: Never true  Transportation Needs: No Transportation Needs   Lack of Transportation (Medical): No   Lack of Transportation (Non-Medical): No  Physical Activity: Sufficiently Active   Days of Exercise per Week: 3 days   Minutes of Exercise per Session: 60 min  Stress: No Stress Concern Present   Feeling of Stress : Not at all  Social Connections: Unknown   Frequency of Communication with Friends and Family: Not on file   Frequency of Social Gatherings with Friends and Family: Not on file   Attends Religious Services: Not on Electrical engineer or Organizations: Not on file   Attends Archivist Meetings: Not on file   Marital Status: Married    Tobacco Counseling Ready to quit: Not Answered Counseling given: Not Answered   Clinical Intake:  Pre-visit preparation completed: Yes        Diabetes: No  How often do you need to have someone help you when you read instructions, pamphlets, or other written materials from your doctor or pharmacy?: 1 - Never   Interpreter Needed?: No      Activities of Daily Living In your present state of health, do you have any difficulty performing the following activities: 07/04/2021  Hearing? N  Vision? N  Difficulty concentrating or making decisions? N  Walking or climbing stairs? N  Dressing or bathing? N  Doing  errands, shopping? N  Preparing Food and eating ? N  Using the Toilet? N  In the past six months, have you accidently leaked urine? N  Do you have problems with loss of bowel control? N  Managing your Medications? N  Managing your Finances? N  Housekeeping or managing your Housekeeping? N  Some recent data might be hidden    Patient Care Team: Crecencio Mc, MD as PCP - General (Internal Medicine) Bary Castilla Forest Gleason, MD as Consulting Physician (General Surgery)  Indicate any recent Medical Services you may have received from other than [facility] providers in the past year (date may be approximate).     Assessment:   This is a routine wellness examination for Cheryl [name].   Virtual Visit via Telephone Note  I connected with  Cheryl [name] on 07/04/21 at  1:15 PM EST by telephone and verified that I am speaking with the correct person using two identifiers.  Persons participating in the virtual visit: patient/Nurse Health Advisor   I discussed the limitations, risks, security and privacy concerns of performing an evaluation and management service by telephone and the availability of in person appointments. The patient expressed understanding and agreed to proceed.  Interactive audio and video telecommunications were attempted between this nurse and patient, however failed, due to patient having technical difficulties OR patient did not have access to video capability.  We continued and completed visit with audio only.  Some vital signs may be absent or patient reported.   Hearing/Vision screen Hearing Screening - Comments:: Patient is able to hear conversational tones without difficulty.  No issues reported. Vision Screening - Comments:: Wears corrective lenses They have seen their ophthalmologist in the last 12 months.    Dietary issues and exercise activities discussed: Current Exercise Habits: Home exercise routine, Type of exercise: walking (  Water aerobics 3x weekly. Walking  often.), Time (Minutes): 60, Frequency (Times/Week): 5, Weekly Exercise (Minutes/Week): 300, Intensity: Moderate   Goals Addressed             This Visit's Progress    Quit Smoking       Decrease amount of tobacco use until able to stop       Depression Screen PHQ 2/9 Scores 07/04/2021 05/03/2021 10/26/2020 11/25/2019 11/04/2019 02/13/2019 10/11/2017  PHQ - 2 Score 0 0 0 0 0 0 0  PHQ- 9 Score - - 3 - 0 - 3    Fall Risk Fall Risk  07/04/2021 05/03/2021 02/01/2021 10/26/2020 05/23/2020  Falls in the past year? 0 0 0 0 1  Number falls in past yr: 0 - - - 0  Injury with Fall? - - - - 0  Risk for fall due to : - No Fall Risks - - -  Follow up Falls evaluation completed Falls evaluation completed Falls evaluation completed Falls evaluation completed Falls evaluation completed    [place]: Home free of loose throw rugs in walkways, pet beds, electrical cords, etc? Yes  Adequate lighting in your home to reduce risk of falls? Yes   ASSISTIVE DEVICES UTILIZED TO PREVENT FALLS: Life alert? No  Use of a cane, walker or w/c? No   TIMED UP AND GO: Was the test performed? No .   Cognitive Function:  Patient is alert and oriented x3.   Enjoys multiple ways to enhance brain health stimulation.     Immunizations Immunization History  Administered Date(s) Administered   Pneumococcal Polysaccharide-23 07/27/2014   Tdap 03/19/2011, 07/26/2020   TDAP status: Due, Education has been provided regarding the importance of this vaccine. Advised may receive this vaccine at local pharmacy or Health Dept. Aware to provide a copy of the vaccination record if obtained from local pharmacy or Health Dept. Verbalized acceptance and understanding. Discontinued per patient.   Pneumococcal vaccine status: Declined,  Education has been provided regarding the importance of this vaccine but patient still declined. Advised may receive this vaccine at local pharmacy or Health Dept. Aware  to provide a copy of the vaccination record if obtained from local pharmacy or Health Dept. Verbalized acceptance and understanding.  Discontinued per patient.   Covid-19 vaccine status: Declined, Education has been provided regarding the importance of this vaccine but patient still declined. Advised may receive this vaccine at local pharmacy or Health Dept.or vaccine clinic. Aware to provide a copy of the vaccination record if obtained from local pharmacy or Health Dept. Verbalized acceptance and understanding. Discontinued per patient.   Screening Tests Health Maintenance  Topic Date Due   MAMMOGRAM  03/28/2021   INFLUENZA VACCINE  08/25/2021 (Originally 12/26/2020)   Hepatitis C Screening  07/04/2022 (Originally 07/01/1973)   COLONOSCOPY (Pts 45-44yrs Insurance coverage will need to be confirmed)  11/20/2022   TETANUS/TDAP  07/27/2030   DEXA SCAN  Completed   HPV VACCINES  Aged Out   Pneumonia Vaccine 58+ Years old  Discontinued   COVID-19 Vaccine  Discontinued   Zoster Vaccines- Shingrix  Discontinued   Health Maintenance Health Maintenance Due  Topic Date Due   MAMMOGRAM  03/28/2021   Mammogram-  scheduled 08/07/21  Bone Density- scheduled 08/07/21  Hepatitis C Screening: deferred.  Vision Screening: Recommended annual ophthalmology exams for early detection of glaucoma and other disorders of the eye.  Dental Screening: Recommended annual dental exams for proper oral hygiene  [employer]  Referral / Chronic Care Management: CRR required this visit?  No   CCM required this visit?  No      Plan:   Keep all routine maintenance appointments.   I have personally reviewed and noted the following in the patients chart:   Medical and social history Use of alcohol, tobacco or illicit drugs  Current medications and supplements including opioid prescriptions. Patient is currently taking opioid prescriptions. Information provided to patient regarding non-opioid alternatives.  Patient advised to discuss non-opioid treatment plan with their provider. Followed by PCP. Functional ability and status Nutritional status Physical activity Advanced directives List of other physicians Hospitalizations, surgeries, and ER visits in previous 12 months Vitals Screenings to include cognitive, depression, and falls Referrals and appointments  In addition, I have reviewed and discussed with patient certain preventive protocols, quality metrics, and best practice recommendations. A written personalized care plan for preventive services as well as general preventive health recommendations were provided to patient via mychart.     OBrien-Blaney, Ediberto Sens L, LPN   [id number]      I have reviewed the above information and agree with above.   Deborra Medina, MD

## 2021-07-04 NOTE — Telephone Encounter (Signed)
Refilled: 05/03/2021 Last OV: 05/03/2021 Next OV: 08/01/2021

## 2021-07-04 NOTE — Telephone Encounter (Signed)
Patient called in need a refill for Hydrcodone 5-325 MG [facility] stated can not get [facility], please send to [facility] in [facility]  Pharmacy Phone #  [phone]

## 2021-07-06 ENCOUNTER — Encounter: Payer: Self-pay | Admitting: Internal Medicine

## 2021-07-10 MED ORDER — VENLAFAXINE HCL ER 75 MG PO CP24: ORAL_CAPSULE | ORAL | 3 refills | Status: DC

## 2021-07-14 ENCOUNTER — Other Ambulatory Visit: Payer: Self-pay | Admitting: Internal Medicine

## 2021-07-19 NOTE — Progress Notes (Signed)
[place] Phone: [phone] Subjective:   [name], am serving as a scribe for Dr. Hulan Saas.  This visit occurred during the SARS-CoV-2 public health emergency.  Safety protocols were in place, including screening questions prior to the visit, additional usage of staff PPE, and extensive cleaning of exam room while observing appropriate contact time as indicated for disinfecting solutions.    I'm seeing this patient by the request  of:  Cheryl Mc, MD  CC: Back and neck pain  [id number]  Cheryl [name] is a 66 y.o. female coming in with complaint of back and neck pain. OMT 06/08/2021. Patient states that she continues to have R shoulder pain into the pec and into the R scapula. Also getting headaches from R sided neck pain. Pain with cervical rotation to L.   Medications patient has been prescribed: None  Taking:  Xray 06/08/2021 Cervical IMPRESSION: Reversal of cervical lordosis with trace stepwise anterolisthesis C3 through C6. Moderate degenerative changes at C6-C7.       Reviewed prior external information including notes and imaging from previsou exam, outside providers and external EMR if available.   As well as notes that were available from care everywhere and other healthcare systems.  Past medical history, social, surgical and family history all reviewed in electronic medical record.  No pertanent information unless stated regarding to the chief complaint.   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   Arrhythmia    left bundle branch block   Arthritis    Blood in stool    Chicken pox    Generalized headaches    History of blood transfusion    Kidney disease, chronic, stage III (GFR 30-59 ml/min)  (HCC)    LBBB (left bundle branch block)    Renal disorder    Thyroid disease    UTI (lower urinary tract infection)     Allergies  Allergen Reactions   Peanuts [Peanut Oil]    Penicillins Other (See Comments)    Hives,rash,nausea,swelling,    Sulfa Antibiotics Other (See Comments)    Hives,rash,nausea,swelling   Influenza Vac Split [Influenza Virus Vaccine]     Pleurisy  1996   Mobic [Meloxicam] Nausea Only    Renal insufficiency   Nickel    Nsaids Other (See Comments)    Decreased gfr    Prednisone Rash     Review of Systems:  No headache, visual changes, nausea, vomiting, diarrhea, constipation, dizziness, abdominal pain, skin rash, fevers, chills, night sweats, weight loss, swollen lymph nodes, body aches, joint swelling, chest pain, shortness of breath, mood changes. POSITIVE muscle aches  Objective  Blood pressure 130/62, pulse 85, height 5\' 3"  (1.6 m), weight 153 lb (69.4 kg), SpO2 92 %.   General: No apparent distress alert and oriented x3 mood and affect normal, dressed appropriately.  HEENT: Pupils equal, extraocular movements intact  Respiratory: Patient's speak in full sentences and does not appear short of breath  Cardiovascular: No lower extremity edema, non tender, no erythema  Patient on exam does have significant loss of lordosis. Has very minimal right-sided rotation and sidebending 25 degrees.  Patient does have maybe mild positive Spurling's noted on the right with 4 out of 5 strength of the C6 distribution on the right side.  Deep tendon  reflexes are intact  Osteopathic findings  C2 flexed rotated and side bent right C7 flexed rotated and side bent right  T3 extended rotated and side bent right inhaled rib T6 extended rotated and side bent left L2 flexed rotated and side bent right Sacrum right on right     Assessment and Plan:  Cervical radiculopathy Believe the patient's neck is secondary to cervical radiculopathy, worsening pain at this  moment.  Affecting daily activities.  He does have some radicular symptoms even today with a Spurling's test.  Do feel advanced imaging is warranted with patient failing all conservative therapy including physical therapy greater than 12 weeks, gabapentin immediate potential weakness noted in the C6 distribution on the right side.  Patient will get the MRI depending on his could be candidate for possible epidural.  Follow-up with me in after imaging to discuss further.    Nonallopathic problems  Decision today to treat with OMT was based on Physical Exam  After verbal consent patient was treated with HVLA, ME, FPR techniques in cervical, rib, thoracic, lumbar, and sacral  areas avoided HVLA on the neck ended only muscle energy  Patient tolerated the procedure well with improvement in symptoms  Patient given exercises, stretches and lifestyle modifications  See medications in patient instructions if given  Patient will follow up in 4-8 weeks      The above documentation has been reviewed and is accurate and complete Lyndal Pulley, DO        Note: This dictation was prepared with Dragon dictation along with smaller phrase technology. Any transcriptional errors that result from this process are unintentional.

## 2021-07-20 ENCOUNTER — Other Ambulatory Visit: Payer: Self-pay

## 2021-07-20 ENCOUNTER — Ambulatory Visit: Payer: PPO | Admitting: Family Medicine

## 2021-07-20 VITALS — BP 130/62 | HR 85 | Ht 63.0 in | Wt 153.0 lb

## 2021-07-20 DIAGNOSIS — M542 Cervicalgia: Secondary | ICD-10-CM | POA: Diagnosis not present

## 2021-07-20 DIAGNOSIS — M9901 Segmental and somatic dysfunction of cervical region: Secondary | ICD-10-CM

## 2021-07-20 DIAGNOSIS — M9904 Segmental and somatic dysfunction of sacral region: Secondary | ICD-10-CM | POA: Diagnosis not present

## 2021-07-20 DIAGNOSIS — M9902 Segmental and somatic dysfunction of thoracic region: Secondary | ICD-10-CM | POA: Diagnosis not present

## 2021-07-20 DIAGNOSIS — M9903 Segmental and somatic dysfunction of lumbar region: Secondary | ICD-10-CM

## 2021-07-20 DIAGNOSIS — M5412 Radiculopathy, cervical region: Secondary | ICD-10-CM | POA: Diagnosis not present

## 2021-07-20 DIAGNOSIS — M9908 Segmental and somatic dysfunction of rib cage: Secondary | ICD-10-CM

## 2021-07-20 NOTE — Assessment & Plan Note (Signed)
Believe the patient's neck is secondary to cervical radiculopathy, worsening pain at this moment.  Affecting daily activities.  He does have some radicular symptoms even today with a Spurling's test.  Do feel advanced imaging is warranted with patient failing all conservative therapy including physical therapy greater than 12 weeks, gabapentin immediate potential weakness noted in the C6 distribution on the right side.  Patient will get the MRI depending on his could be candidate for possible epidural.  Follow-up with me in after imaging to discuss further.

## 2021-07-20 NOTE — Patient Instructions (Addendum)
MRI Cervical Spine [phone] See me in 6 weeks

## 2021-07-25 ENCOUNTER — Ambulatory Visit
Admission: RE | Admit: 2021-07-25 | Discharge: 2021-07-25 | Disposition: A | Discharge status: Home / Self Care | Payer: PPO | Source: Ambulatory Visit | Attending: Family Medicine | Admitting: Family Medicine

## 2021-07-25 ENCOUNTER — Encounter: Payer: Self-pay | Admitting: Internal Medicine

## 2021-07-25 DIAGNOSIS — M542 Cervicalgia: Secondary | ICD-10-CM

## 2021-07-25 DIAGNOSIS — M4802 Spinal stenosis, cervical region: Secondary | ICD-10-CM | POA: Diagnosis not present

## 2021-07-26 ENCOUNTER — Ambulatory Visit: Payer: PPO | Admitting: Internal Medicine

## 2021-08-01 ENCOUNTER — Other Ambulatory Visit: Payer: Self-pay

## 2021-08-01 ENCOUNTER — Encounter: Payer: Self-pay | Admitting: Internal Medicine

## 2021-08-01 ENCOUNTER — Ambulatory Visit (INDEPENDENT_AMBULATORY_CARE_PROVIDER_SITE_OTHER): Payer: PPO | Admitting: Internal Medicine

## 2021-08-01 VITALS — BP 140/76 | HR 75 | Temp 97.9°F | Ht 63.0 in | Wt 153.0 lb

## 2021-08-01 DIAGNOSIS — M545 Low back pain, unspecified: Secondary | ICD-10-CM | POA: Diagnosis not present

## 2021-08-01 DIAGNOSIS — N182 Chronic kidney disease, stage 2 (mild): Secondary | ICD-10-CM

## 2021-08-01 DIAGNOSIS — D561 Beta thalassemia: Secondary | ICD-10-CM

## 2021-08-01 DIAGNOSIS — I728 Aneurysm of other specified arteries: Secondary | ICD-10-CM

## 2021-08-01 DIAGNOSIS — D508 Other iron deficiency anemias: Secondary | ICD-10-CM | POA: Diagnosis not present

## 2021-08-01 DIAGNOSIS — J439 Emphysema, unspecified: Secondary | ICD-10-CM

## 2021-08-01 DIAGNOSIS — E782 Mixed hyperlipidemia: Secondary | ICD-10-CM

## 2021-08-01 DIAGNOSIS — I771 Stricture of artery: Secondary | ICD-10-CM

## 2021-08-01 DIAGNOSIS — G2581 Restless legs syndrome: Secondary | ICD-10-CM

## 2021-08-01 DIAGNOSIS — G8929 Other chronic pain: Secondary | ICD-10-CM

## 2021-08-01 MED ORDER — ROPINIROLE HCL 1 MG PO TABS: ORAL_TABLET | ORAL | 1 refills | Status: DC

## 2021-08-01 MED ORDER — BUPROPION HCL 75 MG PO TABS: 75.0000 mg | ORAL_TABLET | Freq: Two times a day (BID) | ORAL | 1 refills | Status: DC

## 2021-08-01 NOTE — Assessment & Plan Note (Signed)
Secondary to mild spinal stenosis and foraminal stenosis at L5-S1 level. Reminded her of need to modify activities to avoid aggravation of condition  Given use of chronic opiate therapy. Refill history confirmed via [place] Controlled Substance databas, accessed by me today.[name] ?

## 2021-08-01 NOTE — Assessment & Plan Note (Signed)
Involving bilateral lung apices by prior CT.  She is asymptomatic but continues to smoke.  Advice given  necouarged to continue reducing her tobacco consumption. [name]  MDI refilled ?

## 2021-08-01 NOTE — Patient Instructions (Signed)
Increase your dose of  Ropinirole to 1 mg using the 0.5 mg tablets you still have ? ?I have reduced the wellbutrin dose to 75 mg twice daily  ? ? ?

## 2021-08-01 NOTE — Assessment & Plan Note (Signed)
Asymptomatic despite critical stenosis and diagnosis of chronic mesenteric ischemia. Continue statin. Losartan stopped by patient  due to hypotension.  Advised to quit smoking. Annual follow up with vascular surgeon . ?

## 2021-08-01 NOTE — Progress Notes (Signed)
Subjective:  Patient ID: Cheryl [name], female    DOB: 1955/09/21  Age: 66 y.o. MRN: [id number]  CC: The primary encounter diagnosis was Beta thalassemia ([place]). Diagnoses of Chronic renal impairment, stage 2 (mild), Mixed hyperlipidemia, Other iron deficiency anemia, Restless legs, Chronic low back pain without sciatica, unspecified back pain laterality, Pulmonary emphysema, unspecified emphysema type [facility]), Splenic artery aneurysm ([place]), and Celiac artery stenosis ([place]) were also pertinent to this visit.   This visit occurred during the SARS-CoV-2 public health emergency.  Safety protocols were in place, including screening questions prior to the visit, additional usage of staff PPE, and extensive cleaning of exam room while observing appropriate contact time as indicated for disinfecting solutions.    HPI Cheryl [name] presents for  medication refill  Chief Complaint  Patient presents with   Follow-up    3 month follow up for medication refills   1) Chronic low back pain due to spinal stenosis . Managed with Vicodin  and tramadol  Refill history confirmed via [place] Controlled Substance databas, accessed by me today.. still able to garden , which she loves.     2) Emphysema: noted on imaging study .  Addressed her ongoing tobacco abuse.  Remains asymptomatic.     3) Tobacco abuse  she  Has reduced her  tobacco abuse from 2 cartons /month to 1 carton/month.  Has difficulty reducing father due to increased stress:  states that she Stressed out by the 66 yr old ladies in her [place] group who are not following the plan she has outlined.   4) hyperlipidemia:  taking crestor   5) Restless ness getting worse   taking requip 0.5 mg tid and wellbutrin 100 mg bid  for depression.     Outpatient Medications Prior to Visit  Medication Sig Dispense Refill   albuterol (PROAIR HFA) 108 (90 Base) MCG/ACT inhaler Inhale 1-2 puffs into the lungs every 6 (six) hours as needed for wheezing or  shortness of breath. 8 g 2   aspirin EC 81 MG tablet Take 1 tablet (81 mg total) by mouth daily.     Calcium Carbonate-Vit D-Min (CALCIUM 1200 PO) Take 1 tablet by mouth daily.     gabapentin (NEURONTIN) 300 MG capsule Take 1 capsule (300 mg total) by mouth 3 (three) times daily. 270 capsule 3   HYDROcodone-acetaminophen (NORCO/VICODIN) 5-325 MG tablet Take 1 tablet by mouth 2 (two) times daily as needed. 60 tablet 0   HYDROcodone-acetaminophen (NORCO/VICODIN) 5-325 MG tablet Take 1 tablet by mouth 2 (two) times daily as needed. 60 tablet 0   HYDROcodone-acetaminophen (NORCO/VICODIN) 5-325 MG tablet Take 1 tablet by mouth 2 (two) times daily as needed. 60 tablet 0   HYDROcodone-acetaminophen (NORCO/VICODIN) 5-325 MG tablet Take 1 tablet by mouth 2 (two) times daily as needed. 60 tablet 0   metoprolol succinate (TOPROL-XL) 25 MG 24 hr tablet Take 1 tablet (25 mg total) by mouth daily. 90 tablet 1   nitroGLYCERIN (NITROSTAT) 0.4 MG SL tablet DISSOLVE 1 TABLET UNDER THE TONGUE EVERY 5 MINUTES AS  NEEDED FOR CHEST PAIN. MAX  OF 3 TABLETS IN 15 MINUTES. CALL 911 IF PAIN PERSISTS. 100 tablet 3   progesterone (PROMETRIUM) 200 MG capsule Take 200 mg by mouth at bedtime.     propranolol (INDERAL) 10 MG tablet TAKE 1 TABLET BY MOUTH 3  TIMES DAILY AS NEEDED 180 tablet 5   rosuvastatin (CRESTOR) 10 MG tablet Take 1 tablet (10 mg total) by mouth daily. [place]  tablet 3   traMADol (ULTRAM) 50 MG tablet Take 1 tablet (50 mg total) by mouth 2 (two) times daily as needed. for pain 60 tablet 5   venlafaxine XR (EFFEXOR-XR) 75 MG 24 hr capsule TAKE 1 CAPSULE(75 MG) BY MOUTH DAILY WITH BREAKFAST 90 capsule 3   vitamin C (ASCORBIC ACID) 500 MG tablet Take 500 mg by mouth daily.     buPROPion (WELLBUTRIN SR) 100 MG 12 hr tablet Take 1 tablet (100 mg total) by mouth 2 (two) times daily. 180 tablet 3   rOPINIRole (REQUIP) 0.5 MG tablet TAKE 1 TABLET(0.5 MG) BY MOUTH THREE TIMES DAILY 90 tablet 1   predniSONE (DELTASONE) 10 MG  tablet 6 tablets on Day 1 , then reduce by 1 tablet daily until gone (Patient not taking: Reported on 08/01/2021) 21 tablet 0   No facility-administered medications prior to visit.    Review of Systems;  Patient denies headache, fevers, malaise, unintentional weight loss, skin rash, eye pain, sinus congestion and sinus pain, sore throat, dysphagia,  hemoptysis , cough, dyspnea, wheezing, chest pain, palpitations, orthopnea, edema, abdominal pain, nausea, melena, diarrhea, constipation, flank pain, dysuria, hematuria, urinary  Frequency, nocturia, numbness, tingling, seizures,  Focal weakness, Loss of consciousness,  Tremor, insomnia, depression, anxiety, and suicidal ideation.      Objective:  BP 140/76 (BP Location: Left Arm, Patient Position: Sitting, Cuff Size: Normal)    Pulse 75    Temp 97.9 F (36.6 C) (Oral)    Ht 5\' 3"  (1.6 m)    Wt 153 lb (69.4 kg)    SpO2 97%    BMI 27.10 kg/m   BP Readings from Last 3 Encounters:  08/01/21 140/76  07/20/21 130/62  06/08/21 116/70    Wt Readings from Last 3 Encounters:  08/01/21 153 lb (69.4 kg)  07/20/21 153 lb (69.4 kg)  07/04/21 153 lb (69.4 kg)    General appearance: alert, cooperative and appears stated age Ears: normal TM's and external ear canals both ears Throat: lips, mucosa, and tongue normal; teeth and gums normal Neck: no adenopathy, no carotid bruit, supple, symmetrical, trachea midline and thyroid not enlarged, symmetric, no tenderness/mass/nodules Back: symmetric, no curvature. ROM normal. No CVA tenderness. Lungs: clear to auscultation bilaterally Heart: regular rate and rhythm, S1, S2 normal, no murmur, click, rub or gallop Abdomen: soft, non-tender; bowel sounds normal; no masses,  no organomegaly Pulses: 2+ and symmetric Skin: Skin color, texture, turgor normal. No rashes or lesions Lymph nodes: Cervical, supraclavicular, and axillary nodes normal.  Lab Results  Component Value Date   HGBA1C 5.1 10/26/2015    HGBA1C 5.4 11/25/2013    Lab Results  Component Value Date   CREATININE 0.74 01/04/2021   CREATININE 0.90 07/08/2020   CREATININE 1.02 (H) 05/23/2020    Lab Results  Component Value Date   WBC 9.8 01/04/2021   HGB 10.4 (L) 01/04/2021   HCT 33.2 (L) 01/04/2021   PLT 231 01/04/2021   GLUCOSE 96 01/04/2021   CHOL 114 02/01/2021   TRIG 122.0 02/01/2021   HDL 30.70 (L) 02/01/2021   LDLDIRECT 104 (H) 08/15/2012   LDLCALC 59 02/01/2021   ALT 15 07/08/2020   AST 16 07/08/2020   NA 134 (L) 01/04/2021   K 4.3 01/04/2021   CL 104 01/04/2021   CREATININE 0.74 01/04/2021   BUN 15 01/04/2021   CO2 24 01/04/2021   TSH 1.473 01/29/2019   INR 1.0 09/01/2014   HGBA1C 5.1 10/26/2015    MR CERVICAL SPINE WO  CONTRAST  Result Date: 07/26/2021 CLINICAL DATA:  Neck pain. Spondyloarthropathy suspected. Neck pain with right shoulder pain EXAM: MRI CERVICAL SPINE WITHOUT CONTRAST TECHNIQUE: Multiplanar, multisequence MR imaging of the cervical spine was performed. No intravenous contrast was administered. COMPARISON:  Cervical spine radiographs 06/08/2021. Cervical MRI 01/15/2018 FINDINGS: Alignment: Mild anterolisthesis at C5-6. Remaining alignment normal. Vertebrae: Negative for fracture or mass Cord: Cord evaluation limited by motion. Allowing for this, no cord signal abnormality identified. Posterior Fossa, vertebral arteries, paraspinal tissues: Negative Disc levels: C2-3: Negative C3-4: Asymmetric facet degeneration on the left. Mild left foraminal narrowing. Right foramen patent C4-5: Asymmetric facet degeneration on the left. Mild left foraminal narrowing. Right foramen patent C5-6: Disc degeneration with diffuse uncinate spurring. Small central disc protrusion. Mild spinal stenosis. Mild left foraminal narrowing. C6-7: Disc degeneration and diffuse uncinate spurring. Mild facet degeneration. Mild spinal stenosis and mild foraminal stenosis bilaterally C7-T1: Negative for stenosis. IMPRESSION: Mild  left foraminal narrowing C3-4 and C4-5 due to spurring Mild spinal stenosis C5-6 with mild left foraminal narrowing. Mild spinal stenosis C6-7 with mild foraminal narrowing bilaterally due to spurring. Electronically Signed   By: Franchot Gallo M.D.   On: 07/26/2021 15:23    Assessment & Plan:   Problem List Items Addressed This Visit     Beta thalassemia ([place]) - Primary   Relevant Orders   CBC with Differential/Platelet   Celiac artery stenosis (HCC)    Asymptomatic despite critical stenosis and diagnosis of chronic mesenteric ischemia. Continue statin. Losartan stopped by patient  due to hypotension.  Advised to quit smoking. Annual follow up with vascular surgeon .      Chronic lower back pain    Secondary to mild spinal stenosis and foraminal stenosis at L5-S1 level. Reminded her of need to modify activities to avoid aggravation of condition  Given use of chronic opiate therapy. Refill history confirmed via [place] Controlled Substance databas, accessed by me today..      Relevant Medications   buPROPion (WELLBUTRIN) 75 MG tablet   Chronic renal insufficiency   Relevant Orders   Comprehensive metabolic panel   Emphysema of lung ([place])    Involving bilateral lung apices by prior CT.  She is asymptomatic but continues to smoke.  Advice given  necouarged to continue reducing her tobacco consumption. [name]  MDI refilled      Hyperlipidemia   Relevant Orders   TSH   Lipid panel   Iron deficiency anemia   Relevant Orders   IBC + Ferritin   Restless legs    Checking iron stores .  Increase requip dose to 1 mg tid.  Reduce wellbutrin to 75 mg bid       Splenic artery aneurysm (HCC)    I spent 30 minutes dedicated to the care of this patient on the date of this encounter to include pre-visit review of patient's medical history,  most recent imaging studies, Face-to-face time with the patient , and post visit ordering of testing and therapeutics.    Follow-up: No follow-ups on  file.   Crecencio Mc, MD

## 2021-08-01 NOTE — Assessment & Plan Note (Signed)
Checking iron stores .  Increase requip dose to 1 mg tid.  Reduce wellbutrin to 75 mg bid  ?

## 2021-08-02 LAB — CBC WITH DIFFERENTIAL/PLATELET
Basophils Absolute: 0.1 10*3/uL (ref 0.0–0.1)
Basophils Relative: 0.7 % (ref 0.0–3.0)
Eosinophils Absolute: 0.2 10*3/uL (ref 0.0–0.7)
Eosinophils Relative: 1.5 % (ref 0.0–5.0)
HCT: 33 % — ABNORMAL LOW (ref 36.0–46.0)
Hemoglobin: 10.3 g/dL — ABNORMAL LOW (ref 12.0–15.0)
Lymphocytes Relative: 28.8 % (ref 12.0–46.0)
Lymphs Abs: 2.9 10*3/uL (ref 0.7–4.0)
MCHC: 31.4 g/dL (ref 30.0–36.0)
MCV: 69.4 fl — ABNORMAL LOW (ref 78.0–100.0)
Monocytes Absolute: 0.9 10*3/uL (ref 0.1–1.0)
Monocytes Relative: 8.5 % (ref 3.0–12.0)
Neutro Abs: 6.2 10*3/uL (ref 1.4–7.7)
Neutrophils Relative %: 60.5 % (ref 43.0–77.0)
Platelets: 249 10*3/uL (ref 150.0–400.0)
RBC: 4.75 Mil/uL (ref 3.87–5.11)
RDW: 17.7 % — ABNORMAL HIGH (ref 11.5–15.5)
WBC: 10.2 10*3/uL (ref 4.0–10.5)

## 2021-08-02 LAB — COMPREHENSIVE METABOLIC PANEL
ALT: 10 U/L (ref 0–35)
AST: 16 U/L (ref 0–37)
Albumin: 4.6 g/dL (ref 3.5–5.2)
Alkaline Phosphatase: 61 U/L (ref 39–117)
BUN: 15 mg/dL (ref 6–23)
CO2: 23 mEq/L (ref 19–32)
Calcium: 9.1 mg/dL (ref 8.4–10.5)
Chloride: 105 mEq/L (ref 96–112)
Creatinine, Ser: 0.92 mg/dL (ref 0.40–1.20)
GFR: 65.08 mL/min (ref >60.00)
Glucose, Bld: 75 mg/dL (ref 70–99)
Potassium: 4.2 mEq/L (ref 3.5–5.1)
Sodium: 138 mEq/L (ref 135–145)
Total Bilirubin: 1.1 mg/dL (ref 0.2–1.2)
Total Protein: 6.3 g/dL (ref 6.0–8.3)

## 2021-08-02 LAB — LIPID PANEL
Cholesterol: 103 mg/dL (ref 0–200)
HDL: 30.1 mg/dL — ABNORMAL LOW (ref >39.00)
LDL Cholesterol: 44 mg/dL (ref 0–99)
NonHDL: 72.88
Total CHOL/HDL Ratio: 3
Triglycerides: 142 mg/dL (ref 0.0–149.0)
VLDL: 28.4 mg/dL (ref 0.0–40.0)

## 2021-08-02 LAB — TSH: TSH: 1.39 u[IU]/mL (ref 0.35–5.50)

## 2021-08-02 LAB — IBC + FERRITIN
Ferritin: 200.3 ng/mL (ref 10.0–291.0)
Iron: 99 ug/dL (ref 42–145)
Saturation Ratios: 32.1 % (ref 20.0–50.0)
TIBC: 308 ug/dL (ref 250.0–450.0)
Transferrin: 220 mg/dL (ref 212.0–360.0)

## 2021-08-03 ENCOUNTER — Other Ambulatory Visit: Payer: Self-pay | Admitting: Internal Medicine

## 2021-08-03 DIAGNOSIS — M48061 Spinal stenosis, lumbar region without neurogenic claudication: Secondary | ICD-10-CM

## 2021-08-03 DIAGNOSIS — M19011 Primary osteoarthritis, right shoulder: Secondary | ICD-10-CM

## 2021-08-03 MED ORDER — HYDROCODONE-ACETAMINOPHEN 5-325 MG PO TABS: 1.0000 | ORAL_TABLET | Freq: Two times a day (BID) | ORAL | 0 refills | Status: DC | PRN

## 2021-08-03 NOTE — Telephone Encounter (Signed)
Patient requesting refill on hydrocodone last OV 08/01/2021,  Patient last fill was 07/04/2021. ?

## 2021-08-03 NOTE — Telephone Encounter (Signed)
Pt need refill on hydrocodone sent to [facility] in [place]  ?

## 2021-08-07 ENCOUNTER — Ambulatory Visit
Admission: RE | Admit: 2021-08-07 | Discharge: 2021-08-07 | Disposition: A | Discharge status: Home / Self Care | Payer: PPO | Source: Ambulatory Visit | Attending: Internal Medicine | Admitting: Internal Medicine

## 2021-08-07 ENCOUNTER — Other Ambulatory Visit: Payer: Self-pay

## 2021-08-07 DIAGNOSIS — Z780 Asymptomatic menopausal state: Secondary | ICD-10-CM | POA: Diagnosis not present

## 2021-08-07 DIAGNOSIS — Z13820 Encounter for screening for osteoporosis: Secondary | ICD-10-CM | POA: Diagnosis not present

## 2021-08-07 DIAGNOSIS — Z1231 Encounter for screening mammogram for malignant neoplasm of breast: Secondary | ICD-10-CM | POA: Insufficient documentation

## 2021-08-07 DIAGNOSIS — J439 Emphysema, unspecified: Secondary | ICD-10-CM | POA: Insufficient documentation

## 2021-08-11 ENCOUNTER — Other Ambulatory Visit: Payer: Self-pay | Admitting: Internal Medicine

## 2021-08-29 NOTE — Progress Notes (Signed)
?Charlann Boxer D.O. ?[facility] ?[place] ?Phone: [phone] ?Subjective:   ?I, Jacqualin Combes, am serving as a scribe for Dr. Hulan Saas. ? ?This visit occurred during the SARS-CoV-2 public health emergency.  Safety protocols were in place, including screening questions prior to the visit, additional usage of staff PPE, and extensive cleaning of exam room while observing appropriate contact time as indicated for disinfecting solutions.  ?I'm seeing this patient by the request  of:  Crecencio Mc, MD ? ?CC: back and ne ck pain  ? ?[id number]  ?Jenelly Hubbartt is a 66 y.o. female coming in with complaint of back and neck pain. OMT 07/20/2021. Patient states that she is the same as last visit. Has been doing yardwork and her pain is worse afterwards. Pain with cervical rotation.  ? ?Medications patient has been prescribed: None ? ? ? ?  ? ? ? ? ?Reviewed prior external information including notes and imaging from previsou exam, outside providers and external EMR if available.  ? ?As well as notes that were available from care everywhere and other healthcare systems. ? ?Past medical history, social, surgical and family history all reviewed in electronic medical record.  No pertanent information unless stated regarding to the chief complaint.  ? ?Past Medical History:  ?Diagnosis Date  ? 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 Aneurysm of splenic artery [facility]) Jan. 2017  ? Arrhythmia   ? left bundle branch block  ? Arthritis   ? Blood in stool   ? Chicken pox   ? Generalized headaches   ? History of blood transfusion   ? Kidney disease, chronic, stage III (GFR 30-59 ml/min) (HCC)   ? LBBB (left bundle branch block)   ? Renal disorder   ? Thyroid disease   ? UTI (lower urinary tract infection)   ?  ?Allergies  ?Allergen Reactions  ? Peanuts [Peanut Oil]   ? Penicillins Other (See  Comments)  ?  Hives,rash,nausea,swelling,   ? Sulfa Antibiotics Other (See Comments)  ?  Hives,rash,nausea,swelling  ? Influenza Vac Split [Influenza Virus Vaccine]   ?  Pleurisy  1996  ? Mobic [Meloxicam] Nausea Only  ?  Renal insufficiency  ? Nickel   ? Nsaids Other (See Comments)  ?  Decreased gfr ?  ? Prednisone Rash  ? ? ? ?Review of Systems: ? No headache, visual changes, nausea, vomiting, diarrhea, constipation, dizziness, abdominal pain, skin rash, fevers, chills, night sweats, weight loss, swollen lymph nodes, body aches, joint swelling, chest pain, shortness of breath, mood changes. POSITIVE muscle aches ? ?Objective  ?Blood pressure 130/72, pulse 83, height 5\' 3"  (1.6 m), weight 153 lb (69.4 kg), SpO2 95 %. ?  ?General: No apparent distress alert and oriented x3 mood and affect normal, dressed appropriately.  ?HEENT: Pupils equal, extraocular movements intact  ?Respiratory: Patient's speak in full sentences and does not appear short of breath  ?Neck exam does have some loss of lordosis.  Still has some difficulty with sidebending bilaterally.  Significant tightness in the trapezius bilaterally.  Negative Spurling's noted though today. ?Osteopathic findings ? ?C2 flexed rotated and side bent right ?C5 flexed rotated and side bent left ?T3 extended rotated and side bent right inhaled rib ?T6 extended rotated and side bent left ?L2 flexed rotated and side bent right ?Sacrum right on right ? ? ? ? ?  ?Assessment and Plan: ? ?Cervical radiculopathy ?  Discussed HEP  ?Discussed which activities to do  ?Which activities to do  ?Responding to OMT  ?RTC in 6 weeks   ? ?Nonallopathic problems ? ?Decision today to treat with OMT was based on Physical Exam ? ?After verbal consent patient was treated with HVLA, ME, FPR techniques in cervical, rib, thoracic, lumbar, and sacral  areas ? ?Patient tolerated the procedure well with improvement in symptoms ? ?Patient given exercises, stretches and lifestyle  modifications ? ?See medications in patient instructions if given ? ?Patient will follow up in 6-8 weeks ? ?  ? ? ?The above documentation has been reviewed and is accurate and complete Lyndal Pulley, DO ? ? ? ?  ? ? Note: This dictation was prepared with Dragon dictation along with smaller phrase technology. Any transcriptional errors that result from this process are unintentional.    ?  ?  ? ? ?

## 2021-08-30 ENCOUNTER — Encounter: Payer: Self-pay | Admitting: Family Medicine

## 2021-08-30 ENCOUNTER — Ambulatory Visit: Payer: PPO | Admitting: Family Medicine

## 2021-08-30 VITALS — BP 130/72 | HR 83 | Ht 63.0 in | Wt 153.0 lb

## 2021-08-30 DIAGNOSIS — M5412 Radiculopathy, cervical region: Secondary | ICD-10-CM | POA: Diagnosis not present

## 2021-08-30 DIAGNOSIS — M9901 Segmental and somatic dysfunction of cervical region: Secondary | ICD-10-CM

## 2021-08-30 DIAGNOSIS — M9904 Segmental and somatic dysfunction of sacral region: Secondary | ICD-10-CM | POA: Diagnosis not present

## 2021-08-30 DIAGNOSIS — M9902 Segmental and somatic dysfunction of thoracic region: Secondary | ICD-10-CM | POA: Diagnosis not present

## 2021-08-30 DIAGNOSIS — M9908 Segmental and somatic dysfunction of rib cage: Secondary | ICD-10-CM

## 2021-08-30 DIAGNOSIS — M9903 Segmental and somatic dysfunction of lumbar region: Secondary | ICD-10-CM | POA: Diagnosis not present

## 2021-08-30 NOTE — Patient Instructions (Signed)
Good to see you ?Keep working on exercises ?See me in 6-8 weeks ?

## 2021-08-30 NOTE — Assessment & Plan Note (Signed)
Discussed HEP  ?Discussed which activities to do  ?Which activities to do  ?Responding to OMT  ?RTC in 6 weeks  ?

## 2021-09-19 DIAGNOSIS — N951 Menopausal and female climacteric states: Secondary | ICD-10-CM | POA: Diagnosis not present

## 2021-09-19 DIAGNOSIS — R5383 Other fatigue: Secondary | ICD-10-CM | POA: Diagnosis not present

## 2021-09-22 DIAGNOSIS — N951 Menopausal and female climacteric states: Secondary | ICD-10-CM | POA: Diagnosis not present

## 2021-09-22 DIAGNOSIS — R232 Flushing: Secondary | ICD-10-CM | POA: Diagnosis not present

## 2021-09-22 DIAGNOSIS — Z6826 Body mass index (BMI) 26.0-26.9, adult: Secondary | ICD-10-CM | POA: Diagnosis not present

## 2021-09-22 DIAGNOSIS — M2550 Pain in unspecified joint: Secondary | ICD-10-CM | POA: Diagnosis not present

## 2021-10-16 NOTE — Progress Notes (Unsigned)
[place] Phone: [phone] Subjective:   Cheryl [name], am serving as a scribe for Dr. Hulan Saas.  This visit occurred during the SARS-CoV-2 public health emergency.  Safety protocols were in place, including screening questions prior to the visit, additional usage of staff PPE, and extensive cleaning of exam room while observing appropriate contact time as indicated for disinfecting solutions.   I'm seeing this patient by the request  of:  Crecencio Mc, MD  CC: Low back and neck pain follow-up  [id number]  Cheryl [name] is a 66 y.o. female coming in with complaint of back and neck pain. OMT 08/30/2021. Patient states that she continues to have problems with neck and L shoulder. Patient states that she has throbbing pain in L forearm. Believes she irritated arm after picking up weeds. Having hard time picking up anything with significant weight.   Medications patient has been prescribed: None  Taking:         Reviewed prior external information including notes and imaging from previsou exam, outside providers and external EMR if available.   As well as notes that were available from care everywhere and other healthcare systems.  Past medical history, social, surgical and family history all reviewed in electronic medical record.  [name] pertanent information unless stated regarding to the chief complaint.   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 2017   Arrhythmia    left bundle branch block   Arthritis    Blood in stool    Chicken pox    Generalized headaches    History of blood transfusion    Kidney disease, chronic, stage III (GFR 30-59 ml/min) (HCC)    LBBB (left bundle branch block)    Renal disorder    Thyroid disease     UTI (lower urinary tract infection)     Allergies  Allergen Reactions   Peanuts [Peanut Oil]    Penicillins Other (See Comments)    Hives,rash,nausea,swelling,    Sulfa Antibiotics Other (See Comments)    Hives,rash,nausea,swelling   Influenza Vac Split [Influenza Virus Vaccine]     Pleurisy  1996   Mobic [Meloxicam] Nausea Only    Renal insufficiency   Nickel    Nsaids Other (See Comments)    Decreased gfr    Prednisone Rash     Review of Systems:  [name] headache, visual changes, nausea, vomiting, diarrhea, constipation, dizziness, abdominal pain, skin rash, fevers, chills, night sweats, weight loss, swollen lymph nodes, body aches, joint swelling, chest pain, shortness of breath, mood changes. POSITIVE muscle aches  Objective  Blood pressure 122/72, pulse 72, height 5\' 3"  (1.6 m), weight 150 lb (68 kg), SpO2 97 %.   General: [name] apparent distress alert and oriented x3 mood and affect normal, dressed appropriately.  HEENT: Pupils equal, extraocular movements intact  Respiratory: Patient's speak in full sentences and does not appear short of breath  Cardiovascular: [name] lower extremity edema, non tender, [name] erythema  Back exam does have some mild loss of lordosis.  Some tenderness to palpation on the neck.  With some limited sidebending bilaterally.  Likely [name] significant radicular symptoms.  Left forearm shows the patient does have pain with extension of the wrist against resistance.  Good grip strength are noted.  Full range of motion otherwise  noted.  Osteopathic findings  C2 flexed rotated and side bent left C6 flexed rotated and side bent left T3 extended rotated and side bent left inhaled rib T9 extended rotated and side bent left L2 flexed rotated and side bent right L5 flexed rotated and side bent right Sacrum right on right       Assessment and Plan:  Cervical radiculopathy Patient still has tightness noted of the neck.  Discussed icing regimen and home  exercises, discussed posture.  Patient does have pain medication from another provider.  We discussed other treatment options but overall think patient will do relatively well.  Increase activity slowly.  Follow-up again in 6 to 8 weeks  Lateral epicondylitis, left elbow Elbow anatomy was reviewed, and tendinopathy was explained.  Pt. given a home rehab program. Start with isometrics and ROM, then a series of concentric and eccentric exercises should be done starting with [name] weight, work up to 1 lb, hammer, etc.  Use counterforce strap if working or using hands.  Formal PT would be beneficial. Emphasized stretching an cross-friction massage Emphasized proper palms up lifting biomechanics to unload ECRB Follow-up again in 6 weeks and if worsening pain consider injection   Nonallopathic problems  Decision today to treat with OMT was based on Physical Exam  After verbal consent patient was treated with HVLA, ME, FPR techniques in cervical, rib, thoracic, lumbar, and sacral  areas  Patient tolerated the procedure well with improvement in symptoms  Patient given exercises, stretches and lifestyle modifications  See medications in patient instructions if given  Patient will follow up in 4-8 weeks      The above documentation has been reviewed and is accurate and complete Lyndal Pulley, DO       Note: This dictation was prepared with Dragon dictation along with smaller phrase technology. Any transcriptional errors that result from this process are unintentional.

## 2021-10-17 ENCOUNTER — Ambulatory Visit: Payer: PPO | Admitting: Family Medicine

## 2021-10-17 VITALS — BP 122/72 | HR 72 | Ht 63.0 in | Wt 150.0 lb

## 2021-10-17 DIAGNOSIS — M9904 Segmental and somatic dysfunction of sacral region: Secondary | ICD-10-CM

## 2021-10-17 DIAGNOSIS — M9908 Segmental and somatic dysfunction of rib cage: Secondary | ICD-10-CM

## 2021-10-17 DIAGNOSIS — M5412 Radiculopathy, cervical region: Secondary | ICD-10-CM | POA: Diagnosis not present

## 2021-10-17 DIAGNOSIS — M9901 Segmental and somatic dysfunction of cervical region: Secondary | ICD-10-CM | POA: Diagnosis not present

## 2021-10-17 DIAGNOSIS — M7712 Lateral epicondylitis, left elbow: Secondary | ICD-10-CM | POA: Diagnosis not present

## 2021-10-17 DIAGNOSIS — M9903 Segmental and somatic dysfunction of lumbar region: Secondary | ICD-10-CM | POA: Diagnosis not present

## 2021-10-17 DIAGNOSIS — M9902 Segmental and somatic dysfunction of thoracic region: Secondary | ICD-10-CM

## 2021-10-17 MED ORDER — GABAPENTIN 300 MG PO CAPS: 300.0000 mg | ORAL_CAPSULE | Freq: Three times a day (TID) | ORAL | 3 refills | Status: DC

## 2021-10-17 NOTE — Assessment & Plan Note (Signed)
Patient still has tightness noted of the neck.  Discussed icing regimen and home exercises, discussed posture.  Patient does have pain medication from another provider.  We discussed other treatment options but overall think patient will do relatively well.  Increase activity slowly.  Follow-up again in 6 to 8 weeks

## 2021-10-17 NOTE — Assessment & Plan Note (Signed)
Elbow anatomy was reviewed, and tendinopathy was explained.  Pt. given a home rehab program. Start with isometrics and ROM, then a series of concentric and eccentric exercises should be done starting with no weight, work up to 1 lb, hammer, etc.  Use counterforce strap if working or using hands.  Formal PT would be beneficial. Emphasized stretching an cross-friction massage Emphasized proper palms up lifting biomechanics to unload ECRB Follow-up again in 6 weeks and if worsening pain consider injection

## 2021-10-17 NOTE — Patient Instructions (Addendum)
Gabapentin refilled No overhand lifting See you again in 6-8 weeks

## 2021-10-31 ENCOUNTER — Encounter: Payer: Self-pay | Admitting: Internal Medicine

## 2021-10-31 ENCOUNTER — Ambulatory Visit (INDEPENDENT_AMBULATORY_CARE_PROVIDER_SITE_OTHER): Payer: PPO | Admitting: Internal Medicine

## 2021-10-31 DIAGNOSIS — H8110 Benign paroxysmal vertigo, unspecified ear: Secondary | ICD-10-CM | POA: Diagnosis not present

## 2021-10-31 DIAGNOSIS — I700 Atherosclerosis of aorta: Secondary | ICD-10-CM | POA: Diagnosis not present

## 2021-10-31 DIAGNOSIS — I129 Hypertensive chronic kidney disease with stage 1 through stage 4 chronic kidney disease, or unspecified chronic kidney disease: Secondary | ICD-10-CM

## 2021-10-31 DIAGNOSIS — G4733 Obstructive sleep apnea (adult) (pediatric): Secondary | ICD-10-CM

## 2021-10-31 DIAGNOSIS — M48061 Spinal stenosis, lumbar region without neurogenic claudication: Secondary | ICD-10-CM

## 2021-10-31 DIAGNOSIS — D508 Other iron deficiency anemias: Secondary | ICD-10-CM | POA: Diagnosis not present

## 2021-10-31 DIAGNOSIS — E782 Mixed hyperlipidemia: Secondary | ICD-10-CM | POA: Diagnosis not present

## 2021-10-31 MED ORDER — HYDROCODONE-ACETAMINOPHEN 5-325 MG PO TABS: 1.0000 | ORAL_TABLET | Freq: Two times a day (BID) | ORAL | 0 refills | Status: DC | PRN

## 2021-10-31 MED ORDER — TRAMADOL HCL 50 MG PO TABS: 50.0000 mg | ORAL_TABLET | Freq: Two times a day (BID) | ORAL | 5 refills | Status: DC | PRN

## 2021-10-31 NOTE — Assessment & Plan Note (Signed)
Well continued with  losartan . Cr and lytes unchanged  Lab Results  Component Value Date   CREATININE 0.92 08/01/2021   Lab Results  Component Value Date   NA 138 08/01/2021   K 4.2 08/01/2021   CL 105 08/01/2021   CO2 23 08/01/2021

## 2021-10-31 NOTE — Assessment & Plan Note (Signed)
Identified concurrent with thalassemia minor. Treated to resolution with Venofer in the past.  For follow up with Dr Donneta Romberg in August

## 2021-10-31 NOTE — Assessment & Plan Note (Signed)
Reviewed findings of prior CT scan today..  Patient is tolerating high potency statin therapy and follows up with Vascular surgery in July

## 2021-10-31 NOTE — Assessment & Plan Note (Signed)
Diagnosed in 2014,  But inconsistently treated due to intolerance of full face mask (needed due to mouth breathing habits but complicated by claustrophobia) .  Advised to consider referral for repeat study with alternative mask (nasal pillows and chin strap)

## 2021-10-31 NOTE — Assessment & Plan Note (Signed)
New problem    Symptoms occurring with position changes and sTOP after a few seconds.  Home bp readings have not been below [id number] systolic

## 2021-10-31 NOTE — Assessment & Plan Note (Signed)
Reminded her of need to modify activities to avoid aggravation of condition  Given use of chronic opiate therapy.  Refill history confirmed via [place] Controlled Substance databas, accessed by me today... refills given

## 2021-10-31 NOTE — Progress Notes (Signed)
Subjective:  Patient ID: Cheryl [name], female    DOB: 07/01/1955  Age: 66 y.o. MRN: [id number]  CC: Diagnoses of Lumbar stenosis without neurogenic claudication, Other iron deficiency anemia, Mixed hyperlipidemia, OSA (obstructive sleep apnea), Benign paroxysmal positional vertigo, unspecified laterality, Renal hypertension, and Aortic atherosclerosis (HCC) were pertinent to this visit.   HPI Cheryl [name] presents for follow up on chronic conditions Chief Complaint  Patient presents with   Follow-up    3 month follow up for medication refills   1) Chronic back pain: she has lumbar stenosis managed with analgesics.  and cervical radiculopathy managed by DO Terrilee FilesZach Smith with tramadol and hydrocodone , alternates every 6 hours .   Refill history confirmed via [place] Cont[place] Sub[place] database, accessed by me today.[name].    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  Reviewed her labs and the requirements for blood transfusions (Patient inquired since her sister receives blood ) .  Her hgb has been > 9.5 for  the  past  years. Has received Venofer in the past    needs 1 yr follow up in August with hematology   3) left arm strain trying to help husband up after his last fall .  Ed has had 2 falls in the last month.   The last fall resulted in an injury to his neck  and shoulder   and has an MRI ordered by Terrilee FilesZach Smith.  He is Using a lift chair and a rollinator. Seems to be frozen for a few seconds after standing up.  She has also noted a tremor of the left hand. His center of balance seems to be off .    4) GAD:  worried about husband's health .  Insomnia  taking 20 mg melatonin one hor before bedtime .  Reports that she  lies awake processing thoughts,  and can't turn brain off at night.  No appetite  5) OSA;  untreated consistently due to claustrophobia and mouth breathing .  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  Elevated due to stress  [place]  7) cc:  recurrent dizziness (mentioned after  30 minutes of visit ) positional.  Resolves after a few minutes. .    Outpatient Medications Prior to Visit  Medication Sig Dispense Refill   albuterol (PROAIR HFA) 108 (90 Base) MCG/ACT inhaler Inhale 1-2 puffs into the lungs every 6 (six) hours as needed for wheezing or shortness of breath. 8 g 2   aspirin EC 81 MG tablet Take 1 tablet (81 mg total) by mouth daily.     buPROPion (WELLBUTRIN) 75 MG tablet Take 1 tablet (75 mg total) by mouth 2 (two) times daily. 180 tablet 1   Calcium Carbonate-Vit D-Min (CALCIUM 1200 PO) Take 1 tablet by mouth daily.     gabapentin (NEURONTIN) 300 MG capsule Take 1 capsule (300 mg total) by mouth 3 (three) times daily. 270 capsule 3   metoprolol succinate (TOPROL-XL) 25 MG 24 hr tablet Take 1 tablet (25 mg total) by mouth daily. 90 tablet 1   nitroGLYCERIN (NITROSTAT) 0.4 MG SL tablet DISSOLVE 1 TABLET UNDER THE TONGUE EVERY 5 MINUTES AS  NEEDED FOR CHEST PAIN. MAX  OF 3 TABLETS IN 15 MINUTES. CALL 911 IF PAIN PERSISTS. 100 tablet 3   progesterone (PROMETRIUM) 200 MG capsule Take 200 mg by mouth at bedtime.     propranolol (INDERAL) 10 MG tablet TAKE  1 TABLET BY MOUTH 3  TIMES DAILY AS NEEDED 180 tablet 5   rOPINIRole (REQUIP) 1 MG tablet TAKE 1 TABLET(0.5 MG) BY MOUTH THREE TIMES DAILY 90 tablet 1   rosuvastatin (CRESTOR) 10 MG tablet Take 1 tablet (10 mg total) by mouth daily. 90 tablet 3   venlafaxine XR (EFFEXOR-XR) 75 MG 24 hr capsule TAKE 1 CAPSULE(75 MG) BY MOUTH DAILY WITH BREAKFAST 90 capsule 3   vitamin C (ASCORBIC ACID) 500 MG tablet Take 500 mg by mouth daily.     HYDROcodone-acetaminophen (NORCO/VICODIN) 5-325 MG tablet Take 1 tablet by mouth 2 (two) times daily as needed. 60 tablet 0   HYDROcodone-acetaminophen (NORCO/VICODIN) 5-325 MG tablet Take 1 tablet by mouth 2 (two) times daily as needed. 60 tablet 0   HYDROcodone-acetaminophen (NORCO/VICODIN) 5-325 MG tablet Take  1 tablet by mouth 2 (two) times daily as needed. 60 tablet 0   traMADol (ULTRAM) 50 MG tablet Take 1 tablet (50 mg total) by mouth 2 (two) times daily as needed. for pain 60 tablet 5   No facility-administered medications prior to visit.    Review of Systems;  Patient denies headache, fevers, malaise, unintentional weight loss, skin rash, eye pain, sinus congestion and sinus pain, sore throat, dysphagia,  hemoptysis , cough, dyspnea, wheezing, chest pain, palpitations, orthopnea, edema, abdominal pain, nausea, melena, diarrhea, constipation, flank pain, dysuria, hematuria, urinary  Frequency, nocturia, numbness, tingling, seizures,  Focal weakness, Loss of consciousness,  Tremor, insomnia, depression, anxiety, and suicidal ideation.      Objective:  BP (!) 142/78 (BP Location: Left Arm, Patient Position: Sitting, Cuff Size: Normal)   Pulse 95   Temp 97.8 F (36.6 C) (Oral)   Ht  (1.6 m)   Wt 152 lb 9.6 oz (69.2 kg)   SpO2 96%   BMI 27.03 kg/m   BP Readings from Last 3 Encounters:  10/31/21 (!) 142/78  10/17/21 122/72  08/30/21 130/72    Wt Readings from Last 3 Encounters:  10/31/21 152 lb 9.6 oz (69.2 kg)  10/17/21 150 lb (68 kg)  08/30/21 153 lb (69.4 kg)    General appearance: alert, cooperative and appears stated age Ears: normal TM's and external ear canals both ears Throat: lips, mucosa, and tongue normal; teeth and gums normal Neck: no adenopathy, no carotid bruit, supple, symmetrical, trachea midline and thyroid not enlarged, symmetric, no tenderness/mass/nodules Back: symmetric, no curvature. ROM normal. No CVA tenderness. Lungs: clear to auscultation bilaterally Heart: regular rate and rhythm, S1, S2 normal, no murmur, click, rub or gallop Abdomen: soft, non-tender; bowel sounds normal; no masses,  no organomegaly Pulses: 2+ and symmetric Skin: Skin color, texture, turgor normal. No rashes or lesions Lymph nodes: Cervical, supraclavicular, and axillary nodes  normal.  Lab Results  Component Value Date   HGBA1C 5.1 10/26/2015   HGBA1C 5.4 11/25/2013    Lab Results  Component Value Date   CREATININE 0.92 08/01/2021   CREATININE 0.74 01/04/2021   CREATININE 0.90 07/08/2020    Lab Results  Component Value Date   WBC 10.2 08/01/2021   HGB 10.3 (L) 08/01/2021   HCT 33.0 (L) 08/01/2021   PLT 249.0 08/01/2021   GLUCOSE 75 08/01/2021   CHOL 103 08/01/2021   TRIG 142.0 08/01/2021   HDL 30.10 (L) 08/01/2021   LDLDIRECT 104 (H) 08/15/2012   LDLCALC 44 08/01/2021   ALT 10 08/01/2021   AST 16 08/01/2021   NA 138 08/01/2021   K 4.2 08/01/2021   CL 105 08/01/2021  CREATININE 0.92 08/01/2021   BUN 15 08/01/2021   CO2 23 08/01/2021   TSH 1.39 08/01/2021   INR 1.0 09/01/2014   HGBA1C 5.1 10/26/2015    DG Bone Density  Result Date: 08/07/2021 EXAM: DUAL X-RAY ABSORPTIOMETRY (DXA) FOR BONE MINERAL DENSITY IMPRESSION: crr Your patient Fizza Scales completed a BMD test on 08/07/2021 using the [employer] Advance DXA System (analysis version: 14.10) manufactured by [employer]. The following summarizes the results of our evaluation. PATIENT BIOGRAPHICAL: Name: Cheryl [name], Cheryl [name] Patient ID: [id number] Birth Date: 06/16/1955 Height: 63.5 in. Gender: Female Exam Date: 08/07/2021 Weight: 151.4 lbs. Indications: Advanced Age, Bilateral Ovariectomy, Caucasian, Early Menopause, emphysema, Hysterectomy, kidney disease, Osteoarthritis, POSTmenopausal, Tobacco User Fractures: Treatments: 81 MG ASPIRIN, Calcium, GABAPENTIN, multivitamin, progesterone, Vitamin D ASSESSMENT: The BMD measured at Femur Neck Right is 1.025 g/cm2 with a T-score of -0.1. This patient is considered normal according to World Health Organization [facility]) criteria. L-4 was excluded due to degenerative changes.The scan quality is limited by exclusion of L-4. Site Region Measured Measured WHO Young Adult BMD Date       Age      Classification T-score AP Spine L1-L3 08/07/[age] 66.1 Normal  0.3 1.206 g/cm2 DualFemur Neck Right 08/07/2021 66.1 Normal -0.1 1.025 g/cm2 World Health Organization [facility]) criteria for post-menopausal, Caucasian Women: Normal:       T-score at or above -1 SD Osteopenia:   T-score between -1 and -2.5 SD Osteoporosis: T-score at or below -2.5 SD RECOMMENDATIONS: 1. All patients should optimize calcium and vitamin D intake. 2. Consider FDA-approved medical therapies in postmenopausal women and men aged 71 years and older, based on the following: a. A hip or vertebral (clinical or morphometric) fracture b. T-score < -2.5 at the femoral neck or spine after appropriate evaluation to exclude secondary causes c. Low bone mass (T-score between -1.0 and -2.5 at the femoral neck or spine) and a 10-year probability of a hip fracture > 3% or a 10-year probability of a major osteoporosis-related fracture > 20% based on the [place]-adapted WHO algorithm d. Clinician judgment and/or patient preferences may indicate treatment for people with 10-year fracture probabilities above or below these levels FOLLOW-UP: People with diagnosed cases of osteoporosis or at high risk for fracture should have regular bone mineral density tests. For patients eligible for Medicare, routine testing is allowed once every 2 years. The testing frequency can be increased to one year for patients who have rapidly progressing disease, those who are receiving or discontinuing medical therapy to restore bone mass, or have additional risk factors. I have reviewed this report, and agree with the above findings. [facility] Radiology Electronically Signed   By: Ernie Avena M.D.   On: 08/07/2021 14:43   MM 3D SCREEN BREAST BILATERAL  Result Date: 08/07/2021 CLINICAL DATA:  Screening. EXAM: DIGITAL SCREENING BILATERAL MAMMOGRAM WITH TOMOSYNTHESIS AND CAD TECHNIQUE: Bilateral screening digital craniocaudal and mediolateral oblique mammograms were obtained. Bilateral screening digital breast tomosynthesis was performed.  The images were evaluated with computer-aided detection. COMPARISON:  Previous exam(s). ACR Breast Density Category c: The breast tissue is heterogeneously dense, which may obscure small masses. FINDINGS: There are no findings suspicious for malignancy. IMPRESSION: No mammographic evidence of malignancy. A result letter of this screening mammogram will be mailed directly to the patient. RECOMMENDATION: Screening mammogram in one year. (Code:SM-B-01Y) BI-RADS CATEGORY  1: Negative. Electronically Signed   By: Emmaline Kluver M.D.   On: 08/07/2021 16:04    Assessment & Plan:   Problem List Items  Addressed This Visit     Aortic atherosclerosis [facility])    Reviewed findings of prior CT scan today..  Patient is tolerating high potency statin therapy and follows up with Vascular surgery in July        Benign positional vertigo    New problem    Symptoms occurring with position changes and sTOP after a few seconds.  Home bp readings have not been below 120 systolic       Hyperlipidemia    Managed with rosuvastatin;  Last LDL was 44 in March   Lab Results  Component Value Date   CHOL 103 08/01/2021   HDL 30.10 (L) 08/01/2021   LDLCALC 44 08/01/2021   LDLDIRECT 104 (H) 08/15/2012   TRIG 142.0 08/01/2021   CHOLHDL 3 08/01/2021         Iron deficiency anemia    Identified concurrent with thalassemia minor. Treated to resolution with Venofer in the past.  For follow up with Dr Donneta Romberg in August        Lumbar stenosis without neurogenic claudication    Reminded her of need to modify activities to avoid aggravation of condition  Given use of chronic opiate therapy.  Refill history confirmed via [place] Controlled Substance databas, accessed by me today... refills given       Relevant Medications   HYDROcodone-acetaminophen (NORCO/VICODIN) 5-325 MG tablet   HYDROcodone-acetaminophen (NORCO/VICODIN) 5-325 MG tablet   HYDROcodone-acetaminophen (NORCO/VICODIN) 5-325 MG tablet   traMADol  (ULTRAM) 50 MG tablet   OSA (obstructive sleep apnea)    Diagnosed in 2014,  But inconsistently treated due to intolerance of full face mask (needed due to mouth breathing habits but complicated by claustrophobia) .  Advised to consider referral for repeat study with alternative mask (nasal pillows and chin strap)       Renal hypertension    Well continued with  losartan . Cr and lytes unchanged  Lab Results  Component Value Date   CREATININE 0.92 08/01/2021   Lab Results  Component Value Date   NA 138 08/01/2021   K 4.2 08/01/2021   CL 105 08/01/2021   CO2 23 08/01/2021          I spent a total of  42 minutes with this patient in a face to face visit on the date of this encounter  reviewing prior external information including notes and imaging from previous exam, outside providers and external EMR if available, as well as notes that were available from care everywhere and other healthcare systems,  and post visit ordering of testing and therapeutics.    Follow-up: Return in about 3 months (around 01/31/2022).   Sherlene Shams, MD

## 2021-10-31 NOTE — Assessment & Plan Note (Signed)
Managed with rosuvastatin;  Last LDL was 44 in March   Lab Results  Component Value Date   CHOL 103 08/01/2021   HDL 30.10 (L) 08/01/2021   LDLCALC 44 08/01/2021   LDLDIRECT 104 (H) 08/15/2012   TRIG 142.0 08/01/2021   CHOLHDL 3 08/01/2021

## 2021-11-03 ENCOUNTER — Ambulatory Visit: Payer: PPO | Admitting: Internal Medicine

## 2021-12-08 DIAGNOSIS — N951 Menopausal and female climacteric states: Secondary | ICD-10-CM | POA: Diagnosis not present

## 2021-12-10 NOTE — Progress Notes (Unsigned)
MRN : [id number]  Cheryl [name] is a 66 y.o. (1955/07/01) female who presents with chief complaint of check circulation.  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  The patient denies abdominal pain or postprandial symptoms.  The patient denies weight loss as well as nausea.  The patient does not substantiate food fear, particular foods do not seem to aggravate or alleviate the symptoms.  The patient denies bloody bowel movements or diarrhea.  No history of peptic ulcer disease.    She is still having occasional dizziness but not a major issue   No prior peripheral angiograms or vascular interventions.   The patient denies amaurosis fugax or recent TIA symptoms. There are no recent neurological changes noted. The patient denies worsening claudication symptoms No rest pain symptoms.   The patient denies history of DVT, PE or superficial thrombophlebitis. The patient denies recent episodes of angina    Duplex ultrasound shows mild to moderate plaque at the origin of the celiac and minimal plaque/stenosis of the SMA.  The splenic artery aneurysm is not visualized today previous measurement 0.75-1.11 cm.  No significant change compared to last study   Duplex ultrasound of the carotid arteries shows RICA 123456 and LICA 123456  No outpatient medications have been marked as taking for the 12/11/21 encounter (Appointment) with Delana Meyer, Dolores Lory, MD.    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   Arrhythmia    left bundle branch block   Arthritis    Blood in stool    Chicken pox    Generalized headaches    History of blood transfusion    Kidney disease, chronic, stage III (GFR  30-59 ml/min) (HCC)    LBBB (left bundle branch block)    Renal disorder    Thyroid disease    UTI (lower urinary tract infection)     Past Surgical History:  Procedure Laterality Date   [place]   BREAST EXCISIONAL BIOPSY Bilateral 1976   neg   [place]  2004   [facility]   CARDIAC CATHETERIZATION  2006   [facility]   cystic fibrosis tumor removal  1983   THUMB AMPUTATION  1992   traumatic   TONSILLECTOMY AND ADENOIDECTOMY  1964    Social History Social History   Tobacco Use   Smoking status: Every Day    Packs/day: 0.25    Years: 34.00    Total pack years: 8.50    Types: Cigarettes    Last attempt to quit: 04/10/2013    Years since quitting: 8.6   Smokeless tobacco: Never  Substance Use Topics   Alcohol use: Yes    Comment: occasional   Drug use: No    Family History Family History  Problem Relation Age of Onset   Heart disease Mother    Arthritis Mother    Cancer Mother        breast   Hyperlipidemia Mother    Hypertension Mother    Heart attack Mother    Breast cancer Mother 26   Heart disease Father    Diabetes Father  Arthritis Father    Hypertension Father    Parkinson's disease Father    Hodgkin's lymphoma Father        hodgkins disease, prostate   Heart disease Sister    Diabetes Sister    Diabetes Maternal Aunt    Heart disease Maternal Grandmother    Diabetes Maternal Grandmother    Heart disease Maternal Grandfather    Heart disease Paternal Grandmother    Diabetes Paternal Grandmother    Stroke Paternal Grandfather    Heart disease Paternal Grandfather    Diabetes Paternal Grandfather    Breast cancer Cousin        2 pat cousins   Heart disease Brother 47       ami x 8,  4 vessel CABG    Diabetes Brother    Liver disease Brother    Lung disease Brother     Allergies  Allergen Reactions   Peanuts [Peanut Oil]    Penicillins Other (See Comments)     Hives,rash,nausea,swelling,    Sulfa Antibiotics Other (See Comments)    Hives,rash,nausea,swelling   Influenza Vac Split [Influenza Virus Vaccine]     Pleurisy  1996   Mobic [Meloxicam] Nausea Only    Renal insufficiency   Nickel    Nsaids Other (See Comments)    Decreased gfr    Prednisone Rash     REVIEW OF SYSTEMS (Negative unless checked)  Constitutional: [] Weight loss  [] Fever  [] Chills Cardiac: [] Chest pain   [] Chest pressure   [] Palpitations   [] Shortness of breath when laying flat   [] Shortness of breath with exertion. Vascular:  [x] Pain in legs with walking   [] Pain in legs at rest  [] History of DVT   [] Phlebitis   [] Swelling in legs   [] Varicose veins   [] Non-healing ulcers Pulmonary:   [] Uses home oxygen   [] Productive cough   [] Hemoptysis   [] Wheeze  [] COPD   [] Asthma Neurologic:  [] Dizziness   [] Seizures   [] History of stroke   [] History of TIA  [] Aphasia   [] Vissual changes   [] Weakness or numbness in arm   [] Weakness or numbness in leg Musculoskeletal:   [] Joint swelling   [] Joint pain   [] Low back pain Hematologic:  [] Easy bruising  [] Easy bleeding   [] Hypercoagulable state   [] Anemic Gastrointestinal:  [] Diarrhea   [] Vomiting  [] Gastroesophageal reflux/heartburn   [] Difficulty swallowing. Genitourinary:  [] Chronic kidney disease   [] Difficult urination  [] Frequent urination   [] Blood in urine Skin:  [] Rashes   [] Ulcers  Psychological:  [] History of anxiety   []  History of major depression.  Physical Examination  There were no vitals filed for this visit. There is no height or weight on file to calculate BMI. Gen: WD/WN, NAD Head: [place]/AT, No temporalis wasting.  Ear/Nose/Throat: Hearing grossly intact, nares w/o erythema or drainage Eyes: PER, EOMI, sclera nonicteric.  Neck: Supple, no masses.  No bruit or JVD.  Pulmonary:  Good air movement, no audible wheezing, no use of accessory muscles.  Cardiac: RRR, normal S1, S2, no Murmurs. Vascular:   Vessel Right  Left  Radial Palpable Palpable  Carotid Palpable Palpable  Gastrointestinal: soft, non-distended. No guarding/no peritoneal signs.  Musculoskeletal: M/S 5/5 throughout.  No visible deformity.  Neurologic: CN 2-12 intact. Pain and light touch intact in extremities.  Symmetrical.  Speech is fluent. Motor exam as listed above. Psychiatric: Judgment intact, Mood & affect appropriate for pt's clinical situation. Dermatologic: No rashes or ulcers noted.  No changes consistent with cellulitis.  CBC Lab Results  Component Value Date   WBC 10.2 08/01/2021   HGB 10.3 (L) 08/01/2021   HCT 33.0 (L) 08/01/2021   MCV 69.4 Repeated and verified X2. (L) 08/01/2021   PLT 249.0 08/01/2021    BMET    Component Value Date/Time   NA 138 08/01/2021 1422   NA 139 05/23/2020 1147   K 4.2 08/01/2021 1422   CL 105 08/01/2021 1422   CO2 23 08/01/2021 1422   GLUCOSE 75 08/01/2021 1422   BUN 15 08/01/2021 1422   BUN 11 05/23/2020 1147   CREATININE 0.92 08/01/2021 1422   CREATININE 0.95 09/01/2014 1659   CREATININE 0.80 08/15/2012 1609   CALCIUM 9.1 08/01/2021 1422   CALCIUM 9.7 09/01/2014 1659   GFRNONAA >60 01/04/2021 1436   GFRNONAA >60 09/01/2014 1659   GFRAA 67 05/23/2020 1147   GFRAA >60 09/01/2014 1659   CrCl cannot be calculated (Patient's most recent lab result is older than the maximum 21 days allowed.).  COAG Lab Results  Component Value Date   INR 1.0 09/01/2014    Radiology No results found.   Assessment/Plan 1. Bilateral carotid artery stenosis Recommend:   Given the patient's asymptomatic subcritical stenosis no further invasive testing or surgery at this time.   Duplex ultrasound of the carotid arteries shows RICA 1-39% and LICA 40-59%.   Continue antiplatelet therapy as prescribed Continue management of CAD, HTN and Hyperlipidemia Healthy heart diet,  encouraged exercise at least 4 times per week Follow up in 24 months with duplex ultrasound and physical exam.      2. Splenic artery aneurysm (HCC) Recommend:   The patient has evidence of chronic asymptomatic mesenteric atherosclerosis.  The patient denies lifestyle limiting changes at this point in time.  Given the lack of symptoms no intervention is warranted at this time.  Also the splenic artery aneurysm does not appear to be enlarging.   No further invasive studies, angiography or surgery at this time   The patient should continue walking and begin a more formal exercise program.   The patient should continue antiplatelet therapy and aggressive treatment of the lipid abnormalities   Patient should undergo noninvasive studies as ordered. The patient will follow up with me after the studies.  - VAS [place] MESENTERIC; Future  3. Aortic atherosclerosis (HCC) Recommend:   The patient has evidence of atherosclerosis of the lower extremities with claudication.  The patient does not voice lifestyle limiting changes at this point in time.   Noninvasive studies do not suggest clinically significant change.   No invasive studies, angiography or surgery at this time The patient should continue walking and begin a more formal exercise program. The patient should continue antiplatelet therapy and aggressive treatment of the lipid abnormalities   No changes in the patient's medications at this time  4. Pulmonary emphysema, unspecified emphysema type (HCC) Continue pulmonary medications and aerosols as already ordered, these medications have been reviewed and there are no changes at this time.    5. Mixed hyperlipidemia Continue statin as ordered and reviewed, no changes at this time     Levora Dredge, MD  12/10/2021 3:23 PM

## 2021-12-11 ENCOUNTER — Ambulatory Visit (INDEPENDENT_AMBULATORY_CARE_PROVIDER_SITE_OTHER): Payer: PPO

## 2021-12-11 ENCOUNTER — Encounter (INDEPENDENT_AMBULATORY_CARE_PROVIDER_SITE_OTHER): Payer: Self-pay | Admitting: Vascular Surgery

## 2021-12-11 ENCOUNTER — Ambulatory Visit (INDEPENDENT_AMBULATORY_CARE_PROVIDER_SITE_OTHER): Payer: PPO | Admitting: Vascular Surgery

## 2021-12-11 VITALS — BP 131/75 | HR 81 | Resp 16 | Wt 151.8 lb

## 2021-12-11 DIAGNOSIS — I6523 Occlusion and stenosis of bilateral carotid arteries: Secondary | ICD-10-CM | POA: Diagnosis not present

## 2021-12-11 DIAGNOSIS — E782 Mixed hyperlipidemia: Secondary | ICD-10-CM | POA: Diagnosis not present

## 2021-12-11 DIAGNOSIS — I771 Stricture of artery: Secondary | ICD-10-CM

## 2021-12-11 DIAGNOSIS — J439 Emphysema, unspecified: Secondary | ICD-10-CM | POA: Diagnosis not present

## 2021-12-11 DIAGNOSIS — I728 Aneurysm of other specified arteries: Secondary | ICD-10-CM

## 2021-12-11 DIAGNOSIS — I700 Atherosclerosis of aorta: Secondary | ICD-10-CM

## 2021-12-11 NOTE — Progress Notes (Unsigned)
[name] [address] [place] [address] Phone: [phone] Subjective:   Bruce Donath, am serving as a scribe for Dr. Antoine Primas.  I'm seeing this patient by the request  of:  Sherlene Shams, MD  CC:   [id number]  Cheryl [name] is a 66 y.o. female coming in with complaint of back and neck pain. OMT 10/17/2021. Patient states that she is the same as last visit. More trouble with her lower back with weather changes recently. Also c/o numbness in R hip and hip wants to give out when she starts walking.   Medications patient has been prescribed: Gabapentin  Taking: Yes         Reviewed prior external information including notes and imaging from previsou exam, outside providers and external EMR if available.   As well as notes that were available from care everywhere and other healthcare systems.  Past medical history, social, surgical and family history all reviewed in electronic medical record.  No pertanent information unless stated regarding to the chief complaint.   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   Arrhythmia    left bundle branch block   Arthritis    Blood in stool    Chicken pox    Generalized headaches    History of blood transfusion    Kidney disease, chronic, stage III (GFR 30-59 ml/min) (HCC)    LBBB (left bundle branch block)    Renal disorder    Thyroid disease    UTI (lower urinary tract infection)     Allergies  Allergen Reactions   Peanuts [Peanut Oil]    Penicillins Other (See Comments)    Hives,rash,nausea,swelling,    Sulfa Antibiotics Other (See Comments)    Hives,rash,nausea,swelling   Influenza Vac Split [Influenza Virus Vaccine]     Pleurisy  1996   Mobic [Meloxicam] Nausea Only    Renal insufficiency    Nickel    Nsaids Other (See Comments)    Decreased gfr    Prednisone Rash     Review of Systems:  No headache, visual changes, nausea, vomiting, diarrhea, constipation, dizziness, abdominal pain, skin rash, fevers, chills, night sweats, weight loss, swollen lymph nodes, body aches, joint swelling, chest pain, shortness of breath, mood changes. POSITIVE muscle aches  Objective  Blood pressure 132/76, pulse 69, height 5\' 3"  (1.6 m), weight 151 lb (68.5 kg), SpO2 96 %.   General: No apparent distress alert and oriented x3 mood and affect normal, dressed appropriately.  HEENT: Pupils equal, extraocular movements intact  Respiratory: Patient's speak in full sentences and does not appear short of breath  Cardiovascular: No lower extremity edema, non tender, no erythema  MSK:  Back loss of lordosis. Tightness noted of the hip   Osteopathic findings  C2 flexed rotated and side bent right C7 flexed rotated and side bent left T3 extended rotated and side bent right inhaled rib T8 extended rotated and side bent left L2 flexed rotated and side bent right Sacrum right on right    Assessment and Plan:  Lumbar stenosis without neurogenic claudication Known lumbar spinal stenosis.  We discussed that this is likely contributing to more of the hip pain.  Discussed which activities to do and which ones to avoid.  Patient does respond well to manipulation though otherwise.  Follow-up  again in 6 to 8 weeks    Nonallopathic problems  Decision today to treat with OMT was based on Physical Exam  After verbal consent patient was treated with HVLA, ME, FPR techniques in cervical, rib, thoracic, lumbar, and sacral  areas  Patient tolerated the procedure well with improvement in symptoms  Patient given exercises, stretches and lifestyle modifications  See medications in patient instructions if given  Patient will follow up in 4-8 weeks    The above documentation has been reviewed and is  accurate and complete Judi Saa, DO          Note: This dictation was prepared with Dragon dictation along with smaller phrase technology. Any transcriptional errors that result from this process are unintentional.

## 2021-12-12 ENCOUNTER — Ambulatory Visit: Payer: PPO | Admitting: Family Medicine

## 2021-12-12 VITALS — BP 132/76 | HR 69 | Ht 63.0 in | Wt 151.0 lb

## 2021-12-12 DIAGNOSIS — M9902 Segmental and somatic dysfunction of thoracic region: Secondary | ICD-10-CM

## 2021-12-12 DIAGNOSIS — N951 Menopausal and female climacteric states: Secondary | ICD-10-CM | POA: Diagnosis not present

## 2021-12-12 DIAGNOSIS — M9904 Segmental and somatic dysfunction of sacral region: Secondary | ICD-10-CM | POA: Diagnosis not present

## 2021-12-12 DIAGNOSIS — M48061 Spinal stenosis, lumbar region without neurogenic claudication: Secondary | ICD-10-CM | POA: Diagnosis not present

## 2021-12-12 DIAGNOSIS — M9908 Segmental and somatic dysfunction of rib cage: Secondary | ICD-10-CM

## 2021-12-12 DIAGNOSIS — R232 Flushing: Secondary | ICD-10-CM | POA: Diagnosis not present

## 2021-12-12 DIAGNOSIS — M9901 Segmental and somatic dysfunction of cervical region: Secondary | ICD-10-CM

## 2021-12-12 DIAGNOSIS — Z6826 Body mass index (BMI) 26.0-26.9, adult: Secondary | ICD-10-CM | POA: Diagnosis not present

## 2021-12-12 DIAGNOSIS — M9903 Segmental and somatic dysfunction of lumbar region: Secondary | ICD-10-CM | POA: Diagnosis not present

## 2021-12-12 NOTE — Assessment & Plan Note (Signed)
Known lumbar spinal stenosis.  We discussed that this is likely contributing to more of the hip pain.  Discussed which activities to do and which ones to avoid.  Patient does respond well to manipulation though otherwise.  Follow-up again in 6 to 8 weeks

## 2021-12-12 NOTE — Patient Instructions (Signed)
See me in 6-8 weeks 

## 2021-12-18 ENCOUNTER — Telehealth: Payer: Self-pay | Admitting: Internal Medicine

## 2021-12-18 MED ORDER — ROPINIROLE HCL 1 MG PO TABS: ORAL_TABLET | ORAL | 1 refills | Status: DC

## 2021-12-18 NOTE — Telephone Encounter (Signed)
Pt need refill on rOPINIRole sent to [facility]

## 2021-12-18 NOTE — Telephone Encounter (Signed)
Medication has been refilled.

## 2022-01-01 ENCOUNTER — Other Ambulatory Visit: Payer: Self-pay | Admitting: Internal Medicine

## 2022-01-01 DIAGNOSIS — M48061 Spinal stenosis, lumbar region without neurogenic claudication: Secondary | ICD-10-CM

## 2022-01-01 MED ORDER — HYDROCODONE-ACETAMINOPHEN 5-325 MG PO TABS: 1.0000 | ORAL_TABLET | Freq: Two times a day (BID) | ORAL | 0 refills | Status: DC | PRN

## 2022-01-01 NOTE — Telephone Encounter (Signed)
Patient is calling requesting a medication refill for - HYDROcodone-acetaminophen (NORCO/VICODIN) 5-325 MG tablet.

## 2022-01-01 NOTE — Telephone Encounter (Signed)
Refilled: 10/31/2021 Last OV: 10/31/2021 Next OV: 01/31/2022

## 2022-01-03 ENCOUNTER — Other Ambulatory Visit: Payer: Self-pay

## 2022-01-03 DIAGNOSIS — D508 Other iron deficiency anemias: Secondary | ICD-10-CM

## 2022-01-04 ENCOUNTER — Inpatient Hospital Stay: Payer: PPO | Attending: Internal Medicine

## 2022-01-04 DIAGNOSIS — D509 Iron deficiency anemia, unspecified: Secondary | ICD-10-CM | POA: Diagnosis not present

## 2022-01-04 DIAGNOSIS — F17210 Nicotine dependence, cigarettes, uncomplicated: Secondary | ICD-10-CM | POA: Diagnosis not present

## 2022-01-04 DIAGNOSIS — Z87440 Personal history of urinary (tract) infections: Secondary | ICD-10-CM | POA: Insufficient documentation

## 2022-01-04 DIAGNOSIS — Z79899 Other long term (current) drug therapy: Secondary | ICD-10-CM | POA: Insufficient documentation

## 2022-01-04 DIAGNOSIS — F79 Unspecified intellectual disabilities: Secondary | ICD-10-CM | POA: Diagnosis not present

## 2022-01-04 DIAGNOSIS — Z7982 Long term (current) use of aspirin: Secondary | ICD-10-CM | POA: Diagnosis not present

## 2022-01-04 DIAGNOSIS — E538 Deficiency of other specified B group vitamins: Secondary | ICD-10-CM | POA: Diagnosis not present

## 2022-01-04 DIAGNOSIS — Z803 Family history of malignant neoplasm of breast: Secondary | ICD-10-CM | POA: Insufficient documentation

## 2022-01-04 DIAGNOSIS — R918 Other nonspecific abnormal finding of lung field: Secondary | ICD-10-CM | POA: Diagnosis not present

## 2022-01-04 DIAGNOSIS — E079 Disorder of thyroid, unspecified: Secondary | ICD-10-CM | POA: Insufficient documentation

## 2022-01-04 DIAGNOSIS — N183 Chronic kidney disease, stage 3 unspecified: Secondary | ICD-10-CM | POA: Insufficient documentation

## 2022-01-04 DIAGNOSIS — D561 Beta thalassemia: Secondary | ICD-10-CM | POA: Diagnosis not present

## 2022-01-04 DIAGNOSIS — D508 Other iron deficiency anemias: Secondary | ICD-10-CM

## 2022-01-04 LAB — IRON AND TIBC
Iron: 129 ug/dL (ref 28–170)
Saturation Ratios: 39 % — ABNORMAL HIGH (ref 10.4–31.8)
TIBC: 328 ug/dL (ref 250–450)
UIBC: 199 ug/dL

## 2022-01-04 LAB — CBC WITH DIFFERENTIAL/PLATELET
Abs Immature Granulocytes: 0.06 10*3/uL (ref 0.00–0.07)
Basophils Absolute: 0.1 10*3/uL (ref 0.0–0.1)
Basophils Relative: 1 %
Eosinophils Absolute: 0.2 10*3/uL (ref 0.0–0.5)
Eosinophils Relative: 2 %
HCT: 32.7 % — ABNORMAL LOW (ref 36.0–46.0)
Hemoglobin: 10.2 g/dL — ABNORMAL LOW (ref 12.0–15.0)
Immature Granulocytes: 1 %
Lymphocytes Relative: 22 %
Lymphs Abs: 2.1 10*3/uL (ref 0.7–4.0)
MCH: 22.6 pg — ABNORMAL LOW (ref 26.0–34.0)
MCHC: 31.2 g/dL (ref 30.0–36.0)
MCV: 72.5 fL — ABNORMAL LOW (ref 80.0–100.0)
Monocytes Absolute: 0.7 10*3/uL (ref 0.1–1.0)
Monocytes Relative: 7 %
Neutro Abs: 6.6 10*3/uL (ref 1.7–7.7)
Neutrophils Relative %: 67 %
Platelets: 224 10*3/uL (ref 150–400)
RBC: 4.51 MIL/uL (ref 3.87–5.11)
RDW: 20.1 % — ABNORMAL HIGH (ref 11.5–15.5)
WBC: 9.7 10*3/uL (ref 4.0–10.5)
nRBC: 0.9 % — ABNORMAL HIGH (ref 0.0–0.2)

## 2022-01-04 LAB — BASIC METABOLIC PANEL
Anion gap: 6 (ref 5–15)
BUN: 14 mg/dL (ref 8–23)
CO2: 22 mmol/L (ref 22–32)
Calcium: 8.9 mg/dL (ref 8.9–10.3)
Chloride: 109 mmol/L (ref 98–111)
Creatinine, Ser: 0.89 mg/dL (ref 0.44–1.00)
GFR, Estimated: >60 mL/min (ref >60)
Glucose, Bld: 119 mg/dL — ABNORMAL HIGH (ref 70–99)
Potassium: 4.2 mmol/L (ref 3.5–5.1)
Sodium: 137 mmol/L (ref 135–145)

## 2022-01-04 LAB — FERRITIN: Ferritin: 188 ng/mL (ref 11–307)

## 2022-01-04 LAB — VITAMIN B12: Vitamin B-12: 611 pg/mL (ref 180–914)

## 2022-01-11 ENCOUNTER — Inpatient Hospital Stay (HOSPITAL_BASED_OUTPATIENT_CLINIC_OR_DEPARTMENT_OTHER): Payer: PPO | Admitting: Internal Medicine

## 2022-01-11 ENCOUNTER — Encounter: Payer: Self-pay | Admitting: Internal Medicine

## 2022-01-11 ENCOUNTER — Inpatient Hospital Stay: Payer: PPO

## 2022-01-11 DIAGNOSIS — D563 Thalassemia minor: Secondary | ICD-10-CM

## 2022-01-11 DIAGNOSIS — D509 Iron deficiency anemia, unspecified: Secondary | ICD-10-CM | POA: Diagnosis not present

## 2022-01-11 NOTE — Assessment & Plan Note (Addendum)
#   Chronic microcytic anemia/history of beta thalassemia - out of proportion to her anemia. S/p IV venoferx4 in fall of 2017.  # Today hemoglobin is 10.4 saturation 39% ferritin 188 [slightly lower compared to previous]; HOLD Venofer. Continue dietary iron.   # B12 deficiency - on PO B12.   # DISPOSITION: # HOLD Venofer today # follow up 12 months-MD-labs-cbc/bmp/iron studies/ferritin/B12- 1 week prior; venofer IV possible- Dr.B.   Cc; Dr.Tullo.

## 2022-01-11 NOTE — Progress Notes (Signed)
[facility] CONSULT NOTE  Patient Care Team: Sherlene Shams, MD as PCP - General (Internal Medicine) Lemar Livings Merrily Pew, MD as Consulting Physician (General Surgery)  CHIEF COMPLAINTS/PURPOSE OF CONSULTATION: Anemia.  HEMATOLOGY HISTORY  # CHRONIC MICROCYTIC ANEMIA- BETA THALASSEMIA MINOR [colo- 2016; Dr. Fanny Skates July 2017- IV trial of Venofer x4   # Lung nodules [CT- sub cm Jan 2017]-2019 CT scan stable.  Benign.  HISTORY OF PRESENTING ILLNESS: Alone. Walking independently.   Cheryl [name] 66 y.o.  female with a history of beta thalassemia minor; also iron deficiency anemia/B12 deficiency is here for follow-up.  Patient is currently retired.  Denies any shortness of breath or cough.  She denies any blood in stools or black or stools.  Appetite is good.  No weight loss.  Review of Systems  Constitutional:  Negative for chills, diaphoresis, fever and weight loss.  HENT:  Negative for nosebleeds and sore throat.   Eyes:  Negative for double vision.  Respiratory:  Negative for cough, hemoptysis, sputum production and wheezing.   Cardiovascular:  Negative for chest pain, palpitations, orthopnea and leg swelling.  Gastrointestinal:  Negative for abdominal pain, blood in stool, constipation, diarrhea, heartburn, melena, nausea and vomiting.  Genitourinary:  Negative for dysuria, frequency and urgency.  Musculoskeletal:  Negative for back pain and joint pain.  Skin: Negative.  Negative for itching and rash.  Neurological:  Negative for dizziness, tingling, focal weakness, weakness and headaches.  Endo/Heme/Allergies:  Does not bruise/bleed easily.  Psychiatric/Behavioral:  Negative for depression. The patient is not nervous/anxious and does not have insomnia.      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 2017   Arrhythmia    left bundle branch block   Arthritis    Blood in stool    Chicken pox    Generalized headaches    History of blood transfusion    Kidney disease, chronic, stage III (GFR 30-59 ml/min) (HCC)    LBBB (left bundle branch block)    Renal disorder    Thyroid disease    UTI (lower urinary tract infection)     SURGICAL HISTORY: Past Surgical History:  Procedure Laterality Date   ABDOMINAL HYSTERECTOMY  1997   APPENDECTOMY  1981   BREAST EXCISIONAL BIOPSY Bilateral 1976   neg   BREAST SURGERY  1976   CARDIAC CATHETERIZATION  2004   [facility]   CARDIAC CATHETERIZATION  2006   [facility]   cystic fibrosis tumor removal  1983   THUMB AMPUTATION  1992   traumatic   TONSILLECTOMY AND ADENOIDECTOMY  1964    SOCIAL HISTORY: Social History   Socioeconomic History   Marital status: Married    Spouse name: Not on file   Number of children: Not on file   Years of education: Not on file   Highest education level: Not on file  Occupational History   Not on file  Tobacco Use   Smoking status: Every Day    Packs/day: 0.25    Years: 34.00    Total pack years: 8.50    Types: Cigarettes    Last attempt to quit: 04/10/2013    Years since quitting: 8.7   Smokeless tobacco: Never  Substance and Sexual Activity   Alcohol use: Yes    Comment: occasional   Drug use: No  Sexual activity: Not on file  Other Topics Concern   Not on file  Social History Narrative   Married to [employer]; Works for [employer], Agricultural engineer. Quit smoking 2018; ocassional alcohol; lives near to [place].    Social Determinants of Health   Financial Resource Strain: Low Risk  (07/04/2021)   Overall Financial Resource Strain (CARDIA)    Difficulty of Paying Living Expenses: Not hard at all  Food Insecurity: No Food Insecurity (07/04/2021)   Hunger Vital Sign    Worried About Running Out of Food in the Last Year: Never true    Ran Out of Food in the Last Year: Never  true  Transportation Needs: No Transportation Needs (07/04/2021)   PRAPARE - Hydrologist (Medical): No    Lack of Transportation (Non-Medical): No  Physical Activity: Sufficiently Active (07/04/2021)   Exercise Vital Sign    Days of Exercise per Week: 3 days    Minutes of Exercise per Session: 60 min  Stress: No Stress Concern Present (07/04/2021)   [place] of Stress : Not at all  Social Connections: Unknown (07/04/2021)   Social Connection and Isolation Panel [NHANES]    Frequency of Communication with Friends and Family: Not on file    Frequency of Social Gatherings with Friends and Family: Not on file    Attends Religious Services: Not on file    Active Member of Clubs or Organizations: Not on file    Attends Archivist Meetings: Not on file    Marital Status: Married  Intimate Partner Violence: Not At Risk (07/04/2021)   Humiliation, Afraid, Rape, and Kick questionnaire    Fear of Current or Ex-Partner: No    Emotionally Abused: No    Physically Abused: No    Sexually Abused: No    FAMILY HISTORY: Family History  Problem Relation Age of Onset   Heart disease Mother    Arthritis Mother    Cancer Mother        breast   Hyperlipidemia Mother    Hypertension Mother    Heart attack Mother    Breast cancer Mother 46   Heart disease Father    Diabetes Father    Arthritis Father    Hypertension Father    Parkinson's disease Father    Hodgkin's lymphoma Father        hodgkins disease, prostate   Heart disease Sister    Diabetes Sister    Diabetes Maternal Aunt    Heart disease Maternal Grandmother    Diabetes Maternal Grandmother    Heart disease Maternal Grandfather    Heart disease Paternal Grandmother    Diabetes Paternal Grandmother    Stroke Paternal Grandfather    Heart disease Paternal Grandfather    Diabetes Paternal Grandfather    Breast cancer  Cousin        2 pat cousins   Heart disease Brother 66       ami x 8,  4 vessel CABG    Diabetes Brother    Liver disease Brother    Lung disease Brother     ALLERGIES:  is allergic to peanuts [peanut oil], penicillins, sulfa antibiotics, influenza vac split [influenza virus vaccine], mobic [meloxicam], nickel, nsaids, and prednisone.  MEDICATIONS:  Current Outpatient Medications  Medication Sig Dispense Refill   albuterol (PROAIR HFA) 108 (90 Base) MCG/ACT inhaler Inhale 1-2 puffs into the lungs every 6 (six)  hours as needed for wheezing or shortness of breath. 8 g 2   aspirin EC 81 MG tablet Take 1 tablet (81 mg total) by mouth daily.     buPROPion (WELLBUTRIN) 75 MG tablet Take 1 tablet (75 mg total) by mouth 2 (two) times daily. 180 tablet 1   Calcium Carbonate-Vit D-Min (CALCIUM 1200 PO) Take 1 tablet by mouth daily.     Cholecalciferol (VITAMIN D) 125 MCG (5000 UT) CAPS Take by mouth.     gabapentin (NEURONTIN) 300 MG capsule Take 1 capsule (300 mg total) by mouth 3 (three) times daily. 270 capsule 3   HYDROcodone-acetaminophen (NORCO/VICODIN) 5-325 MG tablet Take 1 tablet by mouth 2 (two) times daily as needed. 60 tablet 0   HYDROcodone-acetaminophen (NORCO/VICODIN) 5-325 MG tablet Take 1 tablet by mouth 2 (two) times daily as needed. 60 tablet 0   HYDROcodone-acetaminophen (NORCO/VICODIN) 5-325 MG tablet Take 1 tablet by mouth 2 (two) times daily as needed. 60 tablet 0   metoprolol succinate (TOPROL-XL) 25 MG 24 hr tablet Take 1 tablet (25 mg total) by mouth daily. 90 tablet 1   nitroGLYCERIN (NITROSTAT) 0.4 MG SL tablet DISSOLVE 1 TABLET UNDER THE TONGUE EVERY 5 MINUTES AS  NEEDED FOR CHEST PAIN. MAX  OF 3 TABLETS IN 15 MINUTES. CALL 911 IF PAIN PERSISTS. 100 tablet 3   progesterone (PROMETRIUM) 200 MG capsule Take 200 mg by mouth at bedtime.     propranolol (INDERAL) 10 MG tablet TAKE 1 TABLET BY MOUTH 3  TIMES DAILY AS NEEDED 180 tablet 5   rOPINIRole (REQUIP) 1 MG tablet TAKE 1  TABLET(0.5 MG) BY MOUTH THREE TIMES DAILY 90 tablet 1   rosuvastatin (CRESTOR) 10 MG tablet Take 1 tablet (10 mg total) by mouth daily. 90 tablet 3   traMADol (ULTRAM) 50 MG tablet Take 1 tablet (50 mg total) by mouth 2 (two) times daily as needed. for pain 60 tablet 5   venlafaxine XR (EFFEXOR-XR) 75 MG 24 hr capsule TAKE 1 CAPSULE(75 MG) BY MOUTH DAILY WITH BREAKFAST 90 capsule 3   vitamin C (ASCORBIC ACID) 500 MG tablet Take 500 mg by mouth daily.     No current facility-administered medications for this visit.      [name]  PHYSICAL EXAMINATION:   Vitals:   01/11/22 1100  BP: 136/83  Pulse: 74  Resp: 18  Temp: (!) 96.1 F (35.6 C)   Filed Weights   01/11/22 1100  Weight: 151 lb 3.2 oz (68.6 kg)   Physical Exam HENT:     Head: Normocephalic and atraumatic.     Mouth/Throat:     Pharynx: No oropharyngeal exudate.  Eyes:     Pupils: Pupils are equal, round, and reactive to light.  Cardiovascular:     Rate and Rhythm: Normal rate and regular rhythm.  Pulmonary:     Effort: Pulmonary effort is normal. No respiratory distress.     Breath sounds: Normal breath sounds. No wheezing.  Abdominal:     General: Bowel sounds are normal. There is no distension.     Palpations: Abdomen is soft. There is no mass.     Tenderness: There is no abdominal tenderness. There is no guarding or rebound.  Musculoskeletal:        General: No tenderness. Normal range of motion.     Cervical back: Normal range of motion and neck supple.  Skin:    General: Skin is warm.  Neurological:     Mental Status: She is alert and  oriented to person, place, and time.  Psychiatric:        Mood and Affect: Affect normal.      LABORATORY DATA:  I have reviewed the data as listed Lab Results  Component Value Date   WBC 9.7 01/04/2022   HGB 10.2 (L) 01/04/2022   HCT 32.7 (L) 01/04/2022   MCV 72.5 (L) 01/04/2022   PLT 224 01/04/2022   Recent Labs    08/01/21 1422 01/04/22 1059  NA 138 137  K 4.2  4.2  CL 105 109  CO2 23 22  GLUCOSE 75 119*  BUN 15 14  CREATININE 0.92 0.89  CALCIUM 9.1 8.9  GFRNONAA  --  >60  PROT 6.3  --   ALBUMIN 4.6  --   AST 16  --   ALT 10  --   ALKPHOS 61  --   BILITOT 1.1  --      No results found.  ASSESSMENT & PLAN:   Thalassemia minor # Chronic microcytic anemia/history of beta thalassemia - out of proportion to her anemia. S/p IV venoferx4 in fall of 2017.  # Today hemoglobin is 10.4 saturation 39% ferritin 188 [slightly lower compared to previous]; HOLD Venofer. Continue dietary iron.   # B12 deficiency - on PO B12.   # DISPOSITION: # HOLD Venofer today # follow up 12 months-MD-labs-cbc/bmp/iron studies/ferritin/B12- 1 week prior; venofer IV possible- Dr.B.   Cc; Dr.Tullo.    Cammie Sickle, MD 01/11/2022 10:12 PM

## 2022-01-11 NOTE — Progress Notes (Signed)
Patient denies new problems/concerns today.   °

## 2022-01-21 ENCOUNTER — Other Ambulatory Visit: Payer: Self-pay | Admitting: Internal Medicine

## 2022-01-24 NOTE — Progress Notes (Signed)
[name] [address] [place] [address] Phone: [phone] Subjective:   Bruce Donath, am serving as a scribe for Dr. Antoine Primas.  I'm seeing this patient by the request  of:  Sherlene Shams, MD  CC: Back and neck pain follow-up  [id number]  Cheryl [name] is a 66 y.o. female coming in with complaint of back and neck pain. OMT 7/128/2023. Patient states that her lower back pain has been doing better. Pain in neck and shoulders persists.   Medications patient has been prescribed: Gabapentin  Taking:         Reviewed prior external information including notes and imaging from previsou exam, outside providers and external EMR if available.   As well as notes that were available from care everywhere and other healthcare systems.  Patient has seen her hematologist recently and is stable with her beta thalassemia and iron deficiency.  Past medical history, social, surgical and family history all reviewed in electronic medical record.  No pertanent information unless stated regarding to the chief complaint.   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   Arrhythmia    left bundle branch block   Arthritis    Blood in stool    Chicken pox    Generalized headaches    History of blood transfusion    Kidney disease, chronic, stage III (GFR 30-59 ml/min) (HCC)    LBBB (left bundle branch block)    Renal disorder    Thyroid disease    UTI (lower urinary tract infection)     Allergies  Allergen Reactions   Peanuts [Peanut Oil]    Penicillins Other (See Comments)    Hives,rash,nausea,swelling,    Sulfa Antibiotics Other (See Comments)    Hives,rash,nausea,swelling   Influenza Vac Split [Influenza Virus Vaccine]     Pleurisy  1996   Mobic [Meloxicam] Nausea  Only    Renal insufficiency   Nickel    Nsaids Other (See Comments)    Decreased gfr    Prednisone Rash     Review of Systems:  No headache, visual changes, nausea, vomiting, diarrhea, constipation, dizziness, abdominal pain, skin rash, fevers, chills, night sweats, weight loss, swollen lymph nodes, body aches, joint swelling, chest pain, shortness of breath, mood changes. POSITIVE muscle aches  Objective  Blood pressure 128/72, pulse 82, height 5\' 3"  (1.6 m).   General: No apparent distress alert and oriented x3 mood and affect normal, dressed appropriately.  HEENT: Pupils equal, extraocular movements intact  Respiratory: Patient's speak in full sentences and does not appear short of breath  Cardiovascular: No lower extremity edema, non tender, no erythema  Gait MSK:  Back does have tightness noted in the paraspinal musculature more in the thoracolumbar juncture.  Patient does have some tenderness noted as well.  Tightness noted on the left side of the paraspinal musculature of the neck.  Osteopathic findings  C2 flexed rotated and side bent right C5 flexed rotated and side bent left T3 extended rotated and side bent left inhaled rib T9 extended rotated and side bent left L2 flexed rotated and side bent right Sacrum right on right       Assessment and Plan:  Cervical radiculopathy Patient is doing relatively well but continues to have tightness in the neck.  The patient does have tenderness to  palpation in the paraspinal musculature of the lumbar spine as well.  Patient now is working out and continuing to try to stay active.  Discussed posture and ergonomics, discussed which activities to do and which ones to avoid, increase activity slowly otherwise.  Follow-up with me again in 6 to 8 weeks    Nonallopathic problems  Decision today to treat with OMT was based on Physical Exam  After verbal consent patient was treated with HVLA, ME, FPR techniques in cervical, rib,  thoracic, lumbar, and sacral  areas  Patient tolerated the procedure well with improvement in symptoms  Patient given exercises, stretches and lifestyle modifications  See medications in patient instructions if given  Patient will follow up in 4-8 weeks    The above documentation has been reviewed and is accurate and complete Judi Saa, DO          Note: This dictation was prepared with Dragon dictation along with smaller phrase technology. Any transcriptional errors that result from this process are unintentional.

## 2022-01-31 ENCOUNTER — Ambulatory Visit (INDEPENDENT_AMBULATORY_CARE_PROVIDER_SITE_OTHER): Payer: PPO | Admitting: Internal Medicine

## 2022-01-31 ENCOUNTER — Ambulatory Visit: Payer: PPO | Admitting: Family Medicine

## 2022-01-31 ENCOUNTER — Encounter: Payer: Self-pay | Admitting: Internal Medicine

## 2022-01-31 VITALS — BP 128/72 | HR 82 | Ht 63.0 in

## 2022-01-31 DIAGNOSIS — D508 Other iron deficiency anemias: Secondary | ICD-10-CM | POA: Diagnosis not present

## 2022-01-31 DIAGNOSIS — M5412 Radiculopathy, cervical region: Secondary | ICD-10-CM | POA: Diagnosis not present

## 2022-01-31 DIAGNOSIS — M9904 Segmental and somatic dysfunction of sacral region: Secondary | ICD-10-CM

## 2022-01-31 DIAGNOSIS — M9908 Segmental and somatic dysfunction of rib cage: Secondary | ICD-10-CM

## 2022-01-31 DIAGNOSIS — M48061 Spinal stenosis, lumbar region without neurogenic claudication: Secondary | ICD-10-CM | POA: Diagnosis not present

## 2022-01-31 DIAGNOSIS — G8929 Other chronic pain: Secondary | ICD-10-CM | POA: Diagnosis not present

## 2022-01-31 DIAGNOSIS — M9903 Segmental and somatic dysfunction of lumbar region: Secondary | ICD-10-CM | POA: Diagnosis not present

## 2022-01-31 DIAGNOSIS — J439 Emphysema, unspecified: Secondary | ICD-10-CM | POA: Diagnosis not present

## 2022-01-31 DIAGNOSIS — M545 Low back pain, unspecified: Secondary | ICD-10-CM

## 2022-01-31 DIAGNOSIS — M9902 Segmental and somatic dysfunction of thoracic region: Secondary | ICD-10-CM

## 2022-01-31 DIAGNOSIS — M9901 Segmental and somatic dysfunction of cervical region: Secondary | ICD-10-CM | POA: Diagnosis not present

## 2022-01-31 MED ORDER — HYDROCODONE-ACETAMINOPHEN 5-325 MG PO TABS: 1.0000 | ORAL_TABLET | Freq: Two times a day (BID) | ORAL | 0 refills | Status: DC | PRN

## 2022-01-31 NOTE — Patient Instructions (Addendum)
I recommend that you get the RSV vaccine  tsince you are a smoker .  It's available at [facility], [facility] and [facility]   Keep working on the smoking cessation!  Dill pickle juice,  one ounce  may PREVENT muscle cramps

## 2022-01-31 NOTE — Assessment & Plan Note (Signed)
Patient is doing relatively well but continues to have tightness in the neck.  The patient does have tenderness to palpation in the paraspinal musculature of the lumbar spine as well.  Patient now is working out and continuing to try to stay active.  Discussed posture and ergonomics, discussed which activities to do and which ones to avoid, increase activity slowly otherwise.  Follow-up with me again in 6 to 8 weeks

## 2022-01-31 NOTE — Patient Instructions (Signed)
Great to see you Got a lot of good movement Continue to be active See me in 6-8 weeks

## 2022-01-31 NOTE — Progress Notes (Signed)
Subjective:  Patient ID: Cheryl [name], female    DOB: 02-23-1956  Age: 66 y.o. MRN: [id number]  CC: Diagnoses of Lumbar stenosis without neurogenic claudication, Chronic low back pain without sciatica, unspecified back pain laterality, Pulmonary emphysema, unspecified emphysema type (HCC), and Other iron deficiency anemia were pertinent to this visit.   HPI Cheryl [name] presents for medication refilll  Chief Complaint  Patient presents with   Follow-up    3 month follow on medication refills   1) chronic pain secondary to lumbar spinal stenosis and cervical radiculopathy . Her pain is managed with hydrcodone and tramadol (alternating).  Saw Dr Katrinka Blazing this morning for neck pain . Refill history confirmed via [place] Controlled Substance databas, accessed by me today.[name]   2) Thalassemia/IDA:  saw hematology last month.  Hgb 10.6  ,  venifer stopped ,  oral iron stopped    3) recurrent dizziness with  standing up .  Occurs infrequently,  always after prlonged squatting or kneeling   4) recurrent muscle cramps affecting legs, occurring at night   5) tobacco abuse : smoking daily  less than one pack    Outpatient Medications Prior to Visit  Medication Sig Dispense Refill   albuterol (PROAIR HFA) 108 (90 Base) MCG/ACT inhaler Inhale 1-2 puffs into the lungs every 6 (six) hours as needed for wheezing or shortness of breath. 8 g 2   aspirin EC 81 MG tablet Take 1 tablet (81 mg total) by mouth daily.     buPROPion (WELLBUTRIN) 75 MG tablet Take 1 tablet by mouth twice daily 180 tablet 0   Calcium Carbonate-Vit D-Min (CALCIUM 1200 PO) Take 1 tablet by mouth daily.     Cholecalciferol (VITAMIN D) 125 MCG (5000 UT) CAPS Take by mouth.     gabapentin (NEURONTIN) 300 MG capsule Take 1 capsule (300 mg total) by mouth 3 (three) times daily. 270 capsule 3   metoprolol succinate (TOPROL-XL) 25 MG 24 hr tablet Take 1 tablet (25 mg total) by mouth daily. 90 tablet 1   nitroGLYCERIN (NITROSTAT)  0.4 MG SL tablet DISSOLVE 1 TABLET UNDER THE TONGUE EVERY 5 MINUTES AS  NEEDED FOR CHEST PAIN. MAX  OF 3 TABLETS IN 15 MINUTES. CALL 911 IF PAIN PERSISTS. 100 tablet 3   progesterone (PROMETRIUM) 200 MG capsule Take 200 mg by mouth at bedtime.     propranolol (INDERAL) 10 MG tablet TAKE 1 TABLET BY MOUTH 3  TIMES DAILY AS NEEDED 180 tablet 5   rOPINIRole (REQUIP) 1 MG tablet TAKE 1 TABLET(0.5 MG) BY MOUTH THREE TIMES DAILY 90 tablet 1   rosuvastatin (CRESTOR) 10 MG tablet Take 1 tablet (10 mg total) by mouth daily. 90 tablet 3   traMADol (ULTRAM) 50 MG tablet Take 1 tablet (50 mg total) by mouth 2 (two) times daily as needed. for pain 60 tablet 5   venlafaxine XR (EFFEXOR-XR) 75 MG 24 hr capsule TAKE 1 CAPSULE(75 MG) BY MOUTH DAILY WITH BREAKFAST 90 capsule 3   vitamin C (ASCORBIC ACID) 500 MG tablet Take 500 mg by mouth daily.     HYDROcodone-acetaminophen (NORCO/VICODIN) 5-325 MG tablet Take 1 tablet by mouth 2 (two) times daily as needed. 60 tablet 0   HYDROcodone-acetaminophen (NORCO/VICODIN) 5-325 MG tablet Take 1 tablet by mouth 2 (two) times daily as needed. 60 tablet 0   HYDROcodone-acetaminophen (NORCO/VICODIN) 5-325 MG tablet Take 1 tablet by mouth 2 (two) times daily as needed. 60 tablet 0   No facility-administered medications prior to  visit.    Review of Systems;  Patient denies headache, fevers, malaise, unintentional weight loss, skin rash, eye pain, sinus congestion and sinus pain, sore throat, dysphagia,  hemoptysis , cough, dyspnea, wheezing, chest pain, palpitations, orthopnea, edema, abdominal pain, nausea, melena, diarrhea, constipation, flank pain, dysuria, hematuria, urinary  Frequency, nocturia, numbness, tingling, seizures,  Focal weakness, Loss of consciousness,  Tremor, insomnia, depression, anxiety, and suicidal ideation.      Objective:  BP (!) 140/78 (BP Location: Left Arm, Patient Position: Sitting, Cuff Size: Normal)   Pulse 76   Temp 98 F (36.7 C) (Oral)    Ht 5\' 3"  (1.6 m)   Wt 153 lb (69.4 kg)   SpO2 98%   BMI 27.10 kg/m   BP Readings from Last 3 Encounters:  01/31/22 (!) 140/78  01/31/22 128/72  01/11/22 136/83    Wt Readings from Last 3 Encounters:  01/31/22 153 lb (69.4 kg)  01/11/22 151 lb 3.2 oz (68.6 kg)  12/12/21 151 lb (68.5 kg)    General appearance: alert, cooperative and appears stated age Ears: normal TM's and external ear canals both ears Throat: lips, mucosa, and tongue normal; teeth and gums normal Neck: no adenopathy, no carotid bruit, supple, symmetrical, trachea midline and thyroid not enlarged, symmetric, no tenderness/mass/nodules Back: symmetric, no curvature. ROM normal. No CVA tenderness. Lungs: clear to auscultation bilaterally Heart: regular rate and rhythm, S1, S2 normal, no murmur, click, rub or gallop Abdomen: soft, non-tender; bowel sounds normal; no masses,  no organomegaly Pulses: 2+ and symmetric Skin: Skin color, texture, turgor normal. No rashes or lesions Lymph nodes: Cervical, supraclavicular, and axillary nodes normal.  Lab Results  Component Value Date   HGBA1C 5.1 10/26/2015   HGBA1C 5.4 11/25/2013    Lab Results  Component Value Date   CREATININE 0.89 01/04/2022   CREATININE 0.92 08/01/2021   CREATININE 0.74 01/04/2021    Lab Results  Component Value Date   WBC 9.7 01/04/2022   HGB 10.2 (L) 01/04/2022   HCT 32.7 (L) 01/04/2022   PLT 224 01/04/2022   GLUCOSE 119 (H) 01/04/2022   CHOL 103 08/01/2021   TRIG 142.0 08/01/2021   HDL 30.10 (L) 08/01/2021   LDLDIRECT 104 (H) 08/15/2012   LDLCALC 44 08/01/2021   ALT 10 08/01/2021   AST 16 08/01/2021   NA 137 01/04/2022   K 4.2 01/04/2022   CL 109 01/04/2022   CREATININE 0.89 01/04/2022   BUN 14 01/04/2022   CO2 22 01/04/2022   TSH 1.39 08/01/2021   INR 1.0 09/01/2014   HGBA1C 5.1 10/26/2015    MM 3D SCREEN BREAST BILATERAL  Result Date: 08/07/2021 CLINICAL DATA:  Screening. EXAM: DIGITAL SCREENING BILATERAL MAMMOGRAM  WITH TOMOSYNTHESIS AND CAD TECHNIQUE: Bilateral screening digital craniocaudal and mediolateral oblique mammograms were obtained. Bilateral screening digital breast tomosynthesis was performed. The images were evaluated with computer-aided detection. COMPARISON:  Previous exam(s). ACR Breast Density Category c: The breast tissue is heterogeneously dense, which may obscure small masses. FINDINGS: There are no findings suspicious for malignancy. IMPRESSION: No mammographic evidence of malignancy. A result letter of this screening mammogram will be mailed directly to the patient. RECOMMENDATION: Screening mammogram in one year. (Code:SM-B-01Y) BI-RADS CATEGORY  1: Negative. Electronically Signed   By: 08/09/2021 M.D.   On: 08/07/2021 16:04   DG Bone Density  Result Date: 08/07/2021 EXAM: DUAL X-RAY ABSORPTIOMETRY (DXA) FOR BONE MINERAL DENSITY IMPRESSION: crr Your patient Ruberta Holck completed a BMD test on 08/07/2021 using the 08/09/2021  Advance DXA System (analysis version: 14.10) manufactured by [employer]. The following summarizes the results of our evaluation. PATIENT BIOGRAPHICAL: Name: Drake, [place] Patient ID: [id number] Birth Date: 04/12/1956 Height: 63.5 in. Gender: Female Exam Date: 08/07/2021 Weight: 151.4 lbs. Indications: Advanced Age, Bilateral Ovariectomy, Caucasian, Early Menopause, emphysema, Hysterectomy, kidney disease, Osteoarthritis, POSTmenopausal, Tobacco User Fractures: Treatments: 81 MG ASPIRIN, Calcium, GABAPENTIN, multivitamin, progesterone, Vitamin D ASSESSMENT: The BMD measured at Femur Neck Right is 1.025 g/cm2 with a T-score of -0.1. This patient is considered normal according to World Health Organization [facility]) criteria. L-4 was excluded due to degenerative changes.The scan quality is limited by exclusion of L-4. Site Region Measured Measured WHO Young Adult BMD Date       Age      Classification T-score AP Spine L1-L3 08/07/2021 66.1 Normal 0.3 1.206 g/cm2  DualFemur Neck Right 08/07/2021 66.1 Normal -0.1 1.025 g/cm2 World Health Organization [facility]) criteria for post-menopausal, Caucasian Women: Normal:       T-score at or above -1 SD Osteopenia:   T-score between -1 and -2.5 SD Osteoporosis: T-score at or below -2.5 SD RECOMMENDATIONS: 1. All patients should optimize calcium and vitamin D intake. 2. Consider FDA-approved medical therapies in postmenopausal women and men aged 32 years and older, based on the following: a. A hip or vertebral (clinical or morphometric) fracture b. T-score < -2.5 at the femoral neck or spine after appropriate evaluation to exclude secondary causes c. Low bone mass (T-score between -1.0 and -2.5 at the femoral neck or spine) and a 10-year probability of a hip fracture > 3% or a 10-year probability of a major osteoporosis-related fracture > 20% based on the [place]-adapted WHO algorithm d. Clinician judgment and/or patient preferences may indicate treatment for people with 10-year fracture probabilities above or below these levels FOLLOW-UP: People with diagnosed cases of osteoporosis or at high risk for fracture should have regular bone mineral density tests. For patients eligible for Medicare, routine testing is allowed once every 2 years. The testing frequency can be increased to one year for patients who have rapidly progressing disease, those who are receiving or discontinuing medical therapy to restore bone mass, or have additional risk factors. I have reviewed this report, and agree with the above findings. [facility]iology Electronically Signed   By: Ernie Avena M.D.   On: 08/07/2021 14:43    Assessment & Plan:   Problem List Items Addressed This Visit     Chronic lower back pain    Secondary to lumbar spinal stenosis.  Her pain requires use of chronic opiate therapy.  Refill history confirmed via [place] Controlled Substance databas, accessed by me today... refills given      Relevant Medications    HYDROcodone-acetaminophen (NORCO/VICODIN) 5-325 MG tablet   HYDROcodone-acetaminophen (NORCO/VICODIN) 5-325 MG tablet   HYDROcodone-acetaminophen (NORCO/VICODIN) 5-325 MG tablet   Emphysema of lung (HCC)    Smoking cessation recommended again. Emphysema  Involving bilateral lung apices by prior CT.  . .  MDI refilled      Iron deficiency anemia    Resolved by recent labs. Iron supplementation stopped       Lumbar stenosis without neurogenic claudication   Relevant Medications   HYDROcodone-acetaminophen (NORCO/VICODIN) 5-325 MG tablet   HYDROcodone-acetaminophen (NORCO/VICODIN) 5-325 MG tablet   HYDROcodone-acetaminophen (NORCO/VICODIN) 5-325 MG tablet    I spent a total of  30  minutes with this patient in a face to face visit on the date of this encounter reviewing the last office visit with  me  in June ,  her  recent sports medicine visit, , patient's tobacco abuse,  most recent imaging study ,   and post visit ordering of testing and therapeutics.    Follow-up: No follow-ups on file.   Sherlene Shams, MD

## 2022-02-01 ENCOUNTER — Encounter: Payer: Self-pay | Admitting: Internal Medicine

## 2022-02-01 NOTE — Assessment & Plan Note (Signed)
Secondary to lumbar spinal stenosis.  Her pain requires use of chronic opiate therapy.  Refill history confirmed via [place] Controlled Substance databas, accessed by me today... refills given

## 2022-02-01 NOTE — Assessment & Plan Note (Signed)
Resolved by recent labs. Iron supplementation stopped

## 2022-02-01 NOTE — Assessment & Plan Note (Signed)
Smoking cessation recommended again. Emphysema  Involving bilateral lung apices by prior CT.  . .  MDI refilled

## 2022-02-23 ENCOUNTER — Encounter: Payer: Self-pay | Admitting: Internal Medicine

## 2022-02-27 MED ORDER — ROPINIROLE HCL 1 MG PO TABS: ORAL_TABLET | ORAL | 1 refills | Status: DC

## 2022-03-07 DIAGNOSIS — N951 Menopausal and female climacteric states: Secondary | ICD-10-CM | POA: Diagnosis not present

## 2022-03-07 DIAGNOSIS — M2550 Pain in unspecified joint: Secondary | ICD-10-CM | POA: Diagnosis not present

## 2022-03-12 DIAGNOSIS — N951 Menopausal and female climacteric states: Secondary | ICD-10-CM | POA: Diagnosis not present

## 2022-03-12 DIAGNOSIS — Z6826 Body mass index (BMI) 26.0-26.9, adult: Secondary | ICD-10-CM | POA: Diagnosis not present

## 2022-03-12 DIAGNOSIS — R232 Flushing: Secondary | ICD-10-CM | POA: Diagnosis not present

## 2022-03-12 DIAGNOSIS — M2550 Pain in unspecified joint: Secondary | ICD-10-CM | POA: Diagnosis not present

## 2022-03-21 ENCOUNTER — Telehealth: Payer: Self-pay | Admitting: Internal Medicine

## 2022-03-21 DIAGNOSIS — R1032 Left lower quadrant pain: Secondary | ICD-10-CM | POA: Diagnosis not present

## 2022-03-21 DIAGNOSIS — K5792 Diverticulitis of intestine, part unspecified, without perforation or abscess without bleeding: Secondary | ICD-10-CM | POA: Diagnosis not present

## 2022-03-21 NOTE — Telephone Encounter (Signed)
Pt called in stating she has left side sharp pain and pressure by her bikini line. Sent to access nurse

## 2022-03-21 NOTE — Telephone Encounter (Signed)
Spoke with pt and she stated that she went to [facility] in and they are treating her for diverticulitis with Cipro and Flagyl. I confirmed with pt that she is taking a probiotic, pt stated that she takes one daily.

## 2022-03-26 ENCOUNTER — Encounter (INDEPENDENT_AMBULATORY_CARE_PROVIDER_SITE_OTHER): Payer: Self-pay

## 2022-03-29 NOTE — Progress Notes (Signed)
Date:  03/30/2022   ID:  Cheryl [name], DOB 1956-04-24, MRN [id number]  Patient Location:  [address] [place] [address]   Provider location:   [facility] [place] office  PCP:  Sherlene Shams, MD  Cardiologist:  Hubbard Robinson [facility]  Chief Complaint  Patient presents with   12 month follow up     Patient c/o shortness of breath, dizziness when standing up too quickly and chest pressure at times. Medications reviewed by the patient verbally.      History of Present Illness:    Cheryl [name] is a 66 y.o. female  past medical history of left bundle branch block,  41 year smoking history,  sleep apnea, has chronic fatigue, compliant with her CPAP Previous stress testing, cardiac catheterization at [facility] with reportedly no significant CAD at that time in 2003, additional episodes of palpitations and chest pain,  Prior episodes of chest pain,workup in June 2012 with stress testing/Myoview showing no ischemia  Moderate aortic atherosclerosis  Rare episodes of tachycardia, once per month. Prior chest pain symptoms felt to be atypical in nature. Strong family history of CAD who presents for follow-up of her palpitations, PAD.  Last seen in clinic by myself March 2022  SOB at times, some chest tightness She attributes this to her lung disease Some dizziness when standing up too quickly at times [facility] in the pool 3x a week, 1/2 mile Denies chest pain concerning for angina on exertion  Recent imaging reviewed Carotid u/s 11/2021 1-39% bilateral carotids 70 to 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   She does report some chronic nausea issues Always had to eat crackers when she gets nausea  Still smoking 3-4 cigs a day  Retired, hobbies include sewing, makes cakes  Labs: HGB 10.2 Followed by hematology Normal BMP  Prior history of herniated disk, trouble sitting,  EKG personally reviewed by myself on todays visit NSR rate 76 bpm  lbbb  Other past medical hx reviewed  myoview 01/30/2019 Pharmacological myocardial perfusion imaging study with no significant  Ischemia Fixed defect in the anteroseptal, apical region, likely attenuation artifact/bundle branch block, though unable to exclude prior MI Anteroseptal wall hypokinesis, secondary to bundle branch block,  EF estimated at 40% No EKG changes concerning for ischemia at peak stress or in recovery. Baseline EKG with LBBB Low risk scan Study compared to prior study dated 10/2010, no significant change noted, prior defect detailed above secondary to LBBB was previously noted.  Just started on metoprolol succinate 25   labs reviewed Total chol 107, LD 47 CR 1.01  Still smoking No desire to quit at this time  Working outside in yard  U/s Mesenteric:  Normal [place] Mesenteric artery findings. 70 to 99% stenosis in the  celiac  artery.  Known dilatation of the splenic artery with a maximum diameter of 0.99cm,  distally.     No significant increase in the splenic artery when compared to the  previous exams. Mild increase in the celiac artery when compared to the exam on 11/03/18,  however, it does more closely correlate with studies on 09/09/17 and  08/13/16.    chest CT scan earlier in 2019 reviewed with her showing three-vessel coronary calcification, mild aortic arch atherosclerosis   Denies any tachycardia, has not been taking propranolol No recent lipid panel, possibly one at work Thinks it is good but does not know the details Prior LDL 98 Does not really want  a cholesterol medication   No regular exercise program, weight has been trending upwards    ultrasound  March 2018 by Dr. 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    Stress test 11/10/2010 showing no ischemia. 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   Arrhythmia    left bundle branch block   Arthritis    Blood in stool    Chicken pox    Generalized headaches    History of blood transfusion    Kidney disease, chronic, stage III (GFR 30-59 ml/min) (HCC)    LBBB (left bundle branch block)    Renal disorder    Thyroid disease    UTI (lower urinary tract infection)    Past Surgical History:  Procedure Laterality Date   [place]   BREAST EXCISIONAL BIOPSY Bilateral 1976   neg   BREAST SURGERY  1976   CARDIAC CATHETERIZATION  2004   [facility]   CARDIAC CATHETERIZATION  2006   [facility]   cystic fibrosis tumor removal  1983   THUMB AMPUTATION  1992   traumatic   TONSILLECTOMY AND ADENOIDECTOMY  1964      Allergies:   Peanuts [peanut oil], Penicillins, Sulfa antibiotics, Corticosteroids, Influenza vac split [influenza virus vaccine], Mobic [meloxicam], Nickel, Nsaids, Penicillin g, and Prednisone   Social History   Tobacco Use   Smoking status: Every Day    Packs/day: 0.25    Years: 34.00    Total pack years: 8.50    Types: Cigarettes    Last attempt to quit: 04/10/2013    Years since quitting: 8.9   Smokeless tobacco: Never  Vaping Use   Vaping Use: Never used  Substance Use Topics   Alcohol use: Yes    Comment: occasional   Drug use: No     Current Outpatient Medications on File Prior to Visit  Medication Sig Dispense Refill   albuterol (PROAIR HFA) 108 (90 Base) MCG/ACT inhaler Inhale 1-2 puffs into the lungs every 6 (six) hours as needed for wheezing or shortness of breath. 8 g 2   aspirin EC 81 MG tablet Take 1 tablet  (81 mg total) by mouth daily.     buPROPion (WELLBUTRIN) 75 MG tablet Take 1 tablet by mouth twice daily 180 tablet 0   Calcium Carbonate-Vit D-Min (CALCIUM 1200 PO) Take 1 tablet by mouth daily.     Cholecalciferol (VITAMIN D) 125 MCG (5000 UT) CAPS Take by mouth.     gabapentin (NEURONTIN) 300 MG capsule Take 1 capsule (300 mg total) by mouth 3 (three) times daily. 270 capsule 3   HYDROcodone-acetaminophen (NORCO/VICODIN) 5-325 MG tablet Take 1 tablet by mouth 2 (two) times daily as needed. 60 tablet 0   metoprolol succinate (TOPROL-XL) 25 MG 24 hr tablet Take 1 tablet (25 mg total) by mouth daily. 90 tablet 1   metroNIDAZOLE (FLAGYL) 500 MG tablet Take 500 mg by mouth 3 (three) times daily.     nitroGLYCERIN (NITROSTAT) 0.4 MG SL tablet DISSOLVE 1 TABLET UNDER THE  TONGUE EVERY 5 MINUTES AS  NEEDED FOR CHEST PAIN. MAX  OF 3 TABLETS IN 15 MINUTES. CALL 911 IF PAIN PERSISTS. 100 tablet 3   Prasterone, DHEA, 10 MG CAPS every other day.     progesterone (PROMETRIUM) 200 MG capsule Take 200 mg by mouth at bedtime.     propranolol (INDERAL) 10 MG tablet TAKE 1 TABLET BY MOUTH 3  TIMES DAILY AS NEEDED 180 tablet 5   rOPINIRole (REQUIP) 1 MG tablet TAKE 1 TABLET(0.5 MG) BY MOUTH THREE TIMES DAILY 90 tablet 1   rosuvastatin (CRESTOR) 10 MG tablet Take 1 tablet (10 mg total) by mouth daily. 90 tablet 3   traMADol (ULTRAM) 50 MG tablet Take 1 tablet (50 mg total) by mouth 2 (two) times daily as needed. for pain 60 tablet 5   venlafaxine XR (EFFEXOR-XR) 75 MG 24 hr capsule TAKE 1 CAPSULE(75 MG) BY MOUTH DAILY WITH BREAKFAST 90 capsule 3   vitamin C (ASCORBIC ACID) 500 MG tablet Take 500 mg by mouth daily.     HYDROcodone-acetaminophen (NORCO/VICODIN) 5-325 MG tablet Take 1 tablet by mouth 2 (two) times daily as needed. (Patient not taking: Reported on 03/30/2022) 60 tablet 0   HYDROcodone-acetaminophen (NORCO/VICODIN) 5-325 MG tablet Take 1 tablet by mouth 2 (two) times daily as needed. (Patient not taking:  Reported on 03/30/2022) 60 tablet 0   No current facility-administered medications on file prior to visit.     Family Hx: The patient's family history includes Arthritis in her father and mother; Breast cancer in her cousin; Breast cancer (age of onset: 76) in her mother; Cancer in her mother; Diabetes in her brother, father, maternal aunt, maternal grandmother, paternal grandfather, paternal grandmother, and sister; Heart attack in her mother; Heart disease in her father, maternal grandfather, maternal grandmother, mother, paternal grandfather, paternal grandmother, and sister; Heart disease (age of onset: 46) in her brother; Hodgkin's lymphoma in her father; Hyperlipidemia in her mother; Hypertension in her father and mother; Liver disease in her brother; Lung disease in her brother; Parkinson's disease in her father; Stroke in her paternal grandfather.  ROS:   Please see the history of present illness.    Review of Systems  Constitutional: Negative.   HENT: Negative.    Respiratory: Negative.    Cardiovascular: Negative.   Gastrointestinal: Negative.   Musculoskeletal: Negative.   Neurological: Negative.   Psychiatric/Behavioral: Negative.    All other systems reviewed and are negative.    Labs/Other Tests and Data Reviewed:    Recent Labs: 08/01/2021: ALT 10; TSH 1.39 01/04/2022: BUN 14; Creatinine, Ser 0.89; Hemoglobin 10.2; Platelets 224; Potassium 4.2; Sodium 137   Recent Lipid Panel Lab Results  Component Value Date/Time   CHOL 103 08/01/2021 02:22 PM   CHOL 99 (L) 06/13/2018 08:29 AM   TRIG 142.0 08/01/2021 02:22 PM   HDL 30.10 (L) 08/01/2021 02:22 PM   HDL 34 (L) 06/13/2018 08:29 AM   CHOLHDL 3 08/01/2021 02:22 PM   LDLCALC 44 08/01/2021 02:22 PM   LDLCALC 38 06/13/2018 08:29 AM   LDLDIRECT 104 (H) 08/15/2012 04:09 PM    Wt Readings from Last 3 Encounters:  03/30/22 152 lb 2 oz (69 kg)  01/31/22 153 lb (69.4 kg)  01/11/22 151 lb 3.2 oz (68.6 kg)     Exam:     Vital Signs: Vital signs may also be detailed in the HPI BP (!) 140/60 (BP Location: Left Arm, Patient Position: Sitting, Cuff Size: Normal)   Pulse 76   Ht  5\' 3"  (1.6 m)   Wt 152 lb 2 oz (69 kg)   SpO2 98%   BMI 26.95 kg/m   Constitutional:  oriented to person, place, and time. No distress.  HENT:  Head: Grossly normal Eyes:  no discharge. No scleral icterus.  Neck: No JVD, no carotid bruits  Cardiovascular: Regular rate and rhythm, no murmurs appreciated Pulmonary/Chest: Clear to auscultation bilaterally, no wheezes or rails Abdominal: Soft.  no distension.  no tenderness.  Musculoskeletal: Normal range of motion Neurological:  normal muscle tone. Coordination normal. No atrophy Skin: Skin warm and dry Psychiatric: normal affect, pleasant   ASSESSMENT & PLAN:    Problem List Items Addressed This Visit       Cardiology Problems   Hyperlipidemia   Relevant Orders   EKG 12-Lead   Renal hypertension   Aortic atherosclerosis (HCC)   Relevant Orders   EKG 12-Lead   Celiac artery stenosis (HCC)   Relevant Orders   EKG 12-Lead   Splenic artery aneurysm (HCC)   PAD (peripheral artery disease) (HCC) - Primary   Relevant Orders   EKG 12-Lead   Carotid artery stenosis   Other Visit Diagnoses     History of tobacco abuse       Relevant Orders   EKG 12-Lead     Mixed hyperlipidemia Cholesterol is at goal on the current lipid regimen. No changes to the medications were made.  History of tobacco abuse Smoking handful of cigarettes per day, cessation recommended   Aortic atherosclerosis (HCC) She has coronary calcification, aortic atherosclerosis, Smoking cessation recommended Cholesterol at goal   Celiac artery stenosis (HCC) Noted on ultrasound,  Having chronic nausea Suggested she talk to vein and vascular to see if stenosis would contribute to her symptoms  Palpitations Started on metoprolol succinate 25 mg daily propranolol as needed, has not been  taking Monitor not needed, symptoms stable  Total encounter time more than 30 minutes Greater than 50% was spent in counseling and coordination of care with the patient    Signed, , MD  [facility] [address], [place], [place]

## 2022-03-30 ENCOUNTER — Encounter: Payer: Self-pay | Admitting: Cardiovascular Disease

## 2022-03-30 ENCOUNTER — Ambulatory Visit: Payer: PPO | Attending: Cardiovascular Disease | Admitting: Cardiovascular Disease

## 2022-03-30 VITALS — BP 140/60 | HR 76 | Ht 63.0 in | Wt 152.1 lb

## 2022-03-30 DIAGNOSIS — I129 Hypertensive chronic kidney disease with stage 1 through stage 4 chronic kidney disease, or unspecified chronic kidney disease: Secondary | ICD-10-CM

## 2022-03-30 DIAGNOSIS — I6523 Occlusion and stenosis of bilateral carotid arteries: Secondary | ICD-10-CM

## 2022-03-30 DIAGNOSIS — I700 Atherosclerosis of aorta: Secondary | ICD-10-CM | POA: Diagnosis not present

## 2022-03-30 DIAGNOSIS — I728 Aneurysm of other specified arteries: Secondary | ICD-10-CM

## 2022-03-30 DIAGNOSIS — E782 Mixed hyperlipidemia: Secondary | ICD-10-CM

## 2022-03-30 DIAGNOSIS — I739 Peripheral vascular disease, unspecified: Secondary | ICD-10-CM | POA: Diagnosis not present

## 2022-03-30 DIAGNOSIS — I771 Stricture of artery: Secondary | ICD-10-CM | POA: Diagnosis not present

## 2022-03-30 DIAGNOSIS — Z87891 Personal history of nicotine dependence: Secondary | ICD-10-CM

## 2022-03-30 MED ORDER — METOPROLOL SUCCINATE ER 25 MG PO TB24: 25.0000 mg | ORAL_TABLET | Freq: Every day | ORAL | 3 refills | Status: DC

## 2022-03-30 NOTE — Patient Instructions (Addendum)
Medication Instructions:  No changes  If you need a refill on your cardiac medications before your next appointment, please call your pharmacy.   Lab work: No new labs needed  Testing/Procedures: No new testing needed  Follow-Up: At [facility], you and your health needs are our priority.  As part of our continuing mission to provide you with exceptional heart care, we have created designated Provider Care Teams.  These Care Teams include your primary Cardiologist (physician) and Advanced Practice Providers (APPs -  Physician Assistants and Nurse Practitioners) who all work together to provide you with the care you need, when you need it.  You will need a follow up appointment in 12 months  Providers on your designated Care Team:   Christopher Berge, NP Ryan Dunn, PA-C Cadence Furth, PA-C  COVID-19 Vaccine Information can be found at: [email or web address] For questions related to vaccine distribution or appointments, please email [email or web address] or call [phone].   

## 2022-03-30 NOTE — Addendum Note (Signed)
Addended by: Alvis Lemmings C on: 03/30/2022 03:21 PM   Modules accepted: Orders

## 2022-04-02 ENCOUNTER — Encounter (INDEPENDENT_AMBULATORY_CARE_PROVIDER_SITE_OTHER): Payer: Self-pay | Admitting: Vascular Surgery

## 2022-04-04 ENCOUNTER — Ambulatory Visit: Payer: PPO | Admitting: Family Medicine

## 2022-04-16 ENCOUNTER — Encounter: Payer: Self-pay | Admitting: Internal Medicine

## 2022-04-16 ENCOUNTER — Other Ambulatory Visit: Payer: Self-pay

## 2022-04-16 MED ORDER — ROSUVASTATIN CALCIUM 10 MG PO TABS: 10.0000 mg | ORAL_TABLET | Freq: Every day | ORAL | 0 refills | Status: DC

## 2022-04-17 ENCOUNTER — Other Ambulatory Visit: Payer: Self-pay | Admitting: Internal Medicine

## 2022-05-01 ENCOUNTER — Encounter (INDEPENDENT_AMBULATORY_CARE_PROVIDER_SITE_OTHER): Payer: Self-pay | Admitting: Nurse Practitioner

## 2022-05-01 ENCOUNTER — Ambulatory Visit (INDEPENDENT_AMBULATORY_CARE_PROVIDER_SITE_OTHER): Payer: PPO | Admitting: Nurse Practitioner

## 2022-05-01 VITALS — BP 137/80 | HR 73 | Resp 14 | Ht 63.0 in | Wt 150.0 lb

## 2022-05-01 DIAGNOSIS — I771 Stricture of artery: Secondary | ICD-10-CM

## 2022-05-01 DIAGNOSIS — E782 Mixed hyperlipidemia: Secondary | ICD-10-CM

## 2022-05-01 NOTE — Progress Notes (Signed)
[name] [address] [place] [address] Phone: [phone] Subjective:   Cheryl [name], am serving as a scribe for Dr. Antoine Primas.  I'm seeing this patient by the request  of:  Sherlene Shams, MD  CC: Back and neck pain follow-up  [id number]  Cheryl [name] is a 66 y.o. female coming in with complaint of back and neck pain. OMT 01/31/2022. Patient states lower back has been really given her a "fit" then the left side of her neck when she turns to the right it gets pretty tight and painful.  Patient has been doing a lot more lifting.  Been taking care of her chicken coop  Medications patient has been prescribed: Gabapentin  Taking: Gabapentin          Reviewed prior external information including notes and imaging from previsou exam, outside providers and external EMR if available.   As well as notes that were available from care everywhere and other healthcare systems.  Past medical history, social, surgical and family history all reviewed in electronic medical record.  No pertanent information unless stated regarding to the chief complaint.   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   Arrhythmia    left bundle branch block   Arthritis    Blood in stool    Chicken pox    Generalized headaches    History of blood transfusion    Kidney disease, chronic, stage III (GFR 30-59 ml/min) (HCC)    LBBB (left bundle branch block)    Renal disorder    Thyroid disease    UTI (lower urinary tract infection)     Allergies  Allergen Reactions   Peanuts [Peanut Oil]    Penicillins Other (See Comments)    Hives,rash,nausea,swelling,    Sulfa Antibiotics Other (See Comments)    Hives,rash,nausea,swelling   Corticosteroids     Other Reaction(s): RASH,  SWELLING   Influenza Vac Split [Influenza Virus Vaccine]     Pleurisy  1996   Mobic [Meloxicam] Nausea Only    Renal insufficiency   Nickel    Nsaids Other (See Comments)    Decreased gfr    Penicillin G     Other Reaction(s): RASH , SWELLING   Prednisone Rash     Review of Systems:  No headache, visual changes, nausea, vomiting, diarrhea, constipation, dizziness, abdominal pain, skin rash, fevers, chills, night sweats, weight loss, swollen lymph nodes, body aches, joint swelling, chest pain, shortness of breath, mood changes. POSITIVE muscle aches  Objective  Blood pressure 122/70, pulse 70, height 5\' 3"  (1.6 m), weight 150 lb (68 kg), SpO2 96 %.   General: No apparent distress alert and oriented x3 mood and affect normal, dressed appropriately.  HEENT: Pupils equal, extraocular movements intact  Respiratory: Patient's speak in full sentences and does not appear short of breath  Cardiovascular: No lower extremity edema, non tender, no erythema  Low back exam does have some loss of lordosis.  Patient does have significant tenderness around the sacroiliac joints bilaterally.  Neck exam still has significant decrease in lordosis.  Limited sidebending bilaterally.  Osteopathic findings  C2 flexed rotated and side bent right C4 flexed rotated and side bent left T3 extended rotated and side bent right inhaled rib T7 extended rotated and side bent left L2 flexed rotated and  side bent right L5 flexed rotated and side bent left Sacrum right on right       Assessment and Plan:  Lumbar stenosis without neurogenic claudication Known spinal stenosis.  Discussed with patient about different treatment options.  Patient still does not regularly determine injections if she can help it.  Does respond fairly well to osteopathic manipulation.  Trying to avoid too many medications    Nonallopathic problems  Decision today to treat with OMT was based on Physical Exam  After verbal  consent patient was treated with HVLA, ME, FPR techniques in cervical, rib, thoracic, lumbar, and sacral  areas  Patient tolerated the procedure well with improvement in symptoms  Patient given exercises, stretches and lifestyle modifications  See medications in patient instructions if given  Patient will follow up in 4-8 weeks     The above documentation has been reviewed and is accurate and complete Judi Saa, DO         Note: This dictation was prepared with Dragon dictation along with smaller phrase technology. Any transcriptional errors that result from this process are unintentional.

## 2022-05-02 ENCOUNTER — Encounter: Payer: Self-pay | Admitting: Internal Medicine

## 2022-05-02 ENCOUNTER — Ambulatory Visit (INDEPENDENT_AMBULATORY_CARE_PROVIDER_SITE_OTHER): Payer: PPO | Admitting: Internal Medicine

## 2022-05-02 VITALS — BP 142/78 | HR 67 | Temp 97.4°F | Ht 63.0 in | Wt 151.6 lb

## 2022-05-02 DIAGNOSIS — Z79899 Other long term (current) drug therapy: Secondary | ICD-10-CM

## 2022-05-02 DIAGNOSIS — E782 Mixed hyperlipidemia: Secondary | ICD-10-CM

## 2022-05-02 DIAGNOSIS — K2900 Acute gastritis without bleeding: Secondary | ICD-10-CM

## 2022-05-02 DIAGNOSIS — I129 Hypertensive chronic kidney disease with stage 1 through stage 4 chronic kidney disease, or unspecified chronic kidney disease: Secondary | ICD-10-CM | POA: Diagnosis not present

## 2022-05-02 DIAGNOSIS — M48061 Spinal stenosis, lumbar region without neurogenic claudication: Secondary | ICD-10-CM | POA: Diagnosis not present

## 2022-05-02 DIAGNOSIS — B9681 Helicobacter pylori [H. pylori] as the cause of diseases classified elsewhere: Secondary | ICD-10-CM | POA: Diagnosis not present

## 2022-05-02 DIAGNOSIS — R7301 Impaired fasting glucose: Secondary | ICD-10-CM | POA: Diagnosis not present

## 2022-05-02 DIAGNOSIS — K2970 Gastritis, unspecified, without bleeding: Secondary | ICD-10-CM

## 2022-05-02 MED ORDER — PANTOPRAZOLE SODIUM 40 MG PO TBEC: 40.0000 mg | DELAYED_RELEASE_TABLET | Freq: Every day | ORAL | 3 refills | Status: DC

## 2022-05-02 MED ORDER — HYDROCODONE-ACETAMINOPHEN 5-325 MG PO TABS: 1.0000 | ORAL_TABLET | Freq: Two times a day (BID) | ORAL | 0 refills | Status: DC | PRN

## 2022-05-02 NOTE — Patient Instructions (Addendum)
Starting you on Pantoprazole for stomach.   Please take it twice daily on an empty stomach (preferably)  for 2 weeks,   then reduce to once daily in the morning   let me know if it is helping

## 2022-05-02 NOTE — Progress Notes (Unsigned)
Subjective:  Patient ID: Cheryl [name], female    DOB: 1955-06-09  Age: 66 y.o. MRN: [id number]  CC: The primary encounter diagnosis was Renal hypertension. Diagnoses of Mixed hyperlipidemia, Long-term use of high-risk medication, and Impaired fasting glucose were also pertinent to this visit.   HPI Cheryl [name] presents for  Chief Complaint  Patient presents with   Follow-up    3 month follow up    1) chronic dyspepsia/nausea : occurring daily a few hours after rising,  eating crackers to avoid nausea.  Has  some days where it bothers her later in the day or in the evening.  Accompanied by a feeling of epigastric pain and heaviness;  not taking a PPI.  Saw vascular yesterday angiogram planned .  Does not use NSAIDs   2) Treated for diverticulitis by urgent care with antibiotics.  Had watery profuse  diarrhea,  resolved when abx was stopped   3) chronic pain    Outpatient Medications Prior to Visit  Medication Sig Dispense Refill   albuterol (PROAIR HFA) 108 (90 Base) MCG/ACT inhaler Inhale 1-2 puffs into the lungs every 6 (six) hours as needed for wheezing or shortness of breath. 8 g 2   aspirin EC 81 MG tablet Take 1 tablet (81 mg total) by mouth daily.     buPROPion (WELLBUTRIN) 75 MG tablet Take 1 tablet by mouth twice daily 180 tablet 0   Calcium Carbonate-Vit D-Min (CALCIUM 1200 PO) Take 1 tablet by mouth daily.     Cholecalciferol (VITAMIN D) 125 MCG (5000 UT) CAPS Take by mouth.     gabapentin (NEURONTIN) 300 MG capsule Take 1 capsule (300 mg total) by mouth 3 (three) times daily. 270 capsule 3   HYDROcodone-acetaminophen (NORCO/VICODIN) 5-325 MG tablet Take 1 tablet by mouth 2 (two) times daily as needed. 60 tablet 0   HYDROcodone-acetaminophen (NORCO/VICODIN) 5-325 MG tablet Take 1 tablet by mouth 2 (two) times daily as needed. 60 tablet 0   HYDROcodone-acetaminophen (NORCO/VICODIN) 5-325 MG tablet Take 1 tablet by mouth 2 (two) times daily as needed. 60 tablet 0    metoprolol succinate (TOPROL-XL) 25 MG 24 hr tablet Take 1 tablet (25 mg total) by mouth daily. 90 tablet 3   metroNIDAZOLE (FLAGYL) 500 MG tablet Take 500 mg by mouth 3 (three) times daily.     nitroGLYCERIN (NITROSTAT) 0.4 MG SL tablet DISSOLVE 1 TABLET UNDER THE TONGUE EVERY 5 MINUTES AS  NEEDED FOR CHEST PAIN. MAX  OF 3 TABLETS IN 15 MINUTES. CALL 911 IF PAIN PERSISTS. 100 tablet 3   Prasterone, DHEA, 10 MG CAPS every other day.     progesterone (PROMETRIUM) 200 MG capsule Take 200 mg by mouth at bedtime.     propranolol (INDERAL) 10 MG tablet TAKE 1 TABLET BY MOUTH 3  TIMES DAILY AS NEEDED 180 tablet 5   rOPINIRole (REQUIP) 1 MG tablet TAKE 1 TABLET BY MOUTH THREE TIMES DAILY 90 tablet 1   rosuvastatin (CRESTOR) 10 MG tablet Take 1 tablet (10 mg total) by mouth daily. 90 tablet 0   traMADol (ULTRAM) 50 MG tablet Take 1 tablet (50 mg total) by mouth 2 (two) times daily as needed. for pain 60 tablet 5   venlafaxine XR (EFFEXOR-XR) 75 MG 24 hr capsule TAKE 1 CAPSULE(75 MG) BY MOUTH DAILY WITH BREAKFAST 90 capsule 3   vitamin C (ASCORBIC ACID) 500 MG tablet Take 500 mg by mouth daily.     No facility-administered medications prior to visit.  Review of Systems;  Patient denies headache, fevers, malaise, unintentional weight loss, skin rash, eye pain, sinus congestion and sinus pain, sore throat, dysphagia,  hemoptysis , cough, dyspnea, wheezing, chest pain, palpitations, orthopnea, edema, abdominal pain, nausea, melena, diarrhea, constipation, flank pain, dysuria, hematuria, urinary  Frequency, nocturia, numbness, tingling, seizures,  Focal weakness, Loss of consciousness,  Tremor, insomnia, depression, anxiety, and suicidal ideation.      Objective:  BP (!) 142/78   Pulse 67   Temp (!) 97.4 F (36.3 C) (Oral)   Ht 5\' 3"  (1.6 m)   Wt 151 lb 9.6 oz (68.8 kg)   SpO2 96%   BMI 26.85 kg/m   BP Readings from Last 3 Encounters:  05/02/22 (!) 142/78  05/01/22 137/80  03/30/22 (!)  140/60    Wt Readings from Last 3 Encounters:  05/02/22 151 lb 9.6 oz (68.8 kg)  05/01/22 150 lb (68 kg)  03/30/22 152 lb 2 oz (69 kg)    General appearance: alert, cooperative and appears stated age Ears: normal TM's and external ear canals both ears Throat: lips, mucosa, and tongue normal; teeth and gums normal Neck: no adenopathy, no carotid bruit, supple, symmetrical, trachea midline and thyroid not enlarged, symmetric, no tenderness/mass/nodules Back: symmetric, no curvature. ROM normal. No CVA tenderness. Lungs: clear to auscultation bilaterally Heart: regular rate and rhythm, S1, S2 normal, no murmur, click, rub or gallop Abdomen: soft, non-tender; bowel sounds normal; no masses,  no organomegaly Pulses: 2+ and symmetric Skin: Skin color, texture, turgor normal. No rashes or lesions Lymph nodes: Cervical, supraclavicular, and axillary nodes normal. Neuro:  awake and interactive with normal mood and affect. Higher cortical functions are normal. Speech is clear without word-finding difficulty or dysarthria. Extraocular movements are intact. Visual fields of both eyes are grossly intact. Sensation to light touch is grossly intact bilaterally of upper and lower extremities. Motor examination shows 4+/5 symmetric hand grip and upper extremity and 5/5 lower extremity strength. There is no pronation or drift. Gait is non-ataxic   Lab Results  Component Value Date   HGBA1C 5.1 10/26/2015   HGBA1C 5.4 11/25/2013    Lab Results  Component Value Date   CREATININE 0.89 01/04/2022   CREATININE 0.92 08/01/2021   CREATININE 0.74 01/04/2021    Lab Results  Component Value Date   WBC 9.7 01/04/2022   HGB 10.2 (L) 01/04/2022   HCT 32.7 (L) 01/04/2022   PLT 224 01/04/2022   GLUCOSE 119 (H) 01/04/2022   CHOL 103 08/01/2021   TRIG 142.0 08/01/2021   HDL 30.10 (L) 08/01/2021   LDLDIRECT 104 (H) 08/15/2012   LDLCALC 44 08/01/2021   ALT 10 08/01/2021   AST 16 08/01/2021   NA 137  01/04/2022   K 4.2 01/04/2022   CL 109 01/04/2022   CREATININE 0.89 01/04/2022   BUN 14 01/04/2022   CO2 22 01/04/2022   TSH 1.39 08/01/2021   INR 1.0 09/01/2014   HGBA1C 5.1 10/26/2015    MM 3D SCREEN BREAST BILATERAL  Result Date: 08/07/2021 CLINICAL DATA:  Screening. EXAM: DIGITAL SCREENING BILATERAL MAMMOGRAM WITH TOMOSYNTHESIS AND CAD TECHNIQUE: Bilateral screening digital craniocaudal and mediolateral oblique mammograms were obtained. Bilateral screening digital breast tomosynthesis was performed. The images were evaluated with computer-aided detection. COMPARISON:  Previous exam(s). ACR Breast Density Category c: The breast tissue is heterogeneously dense, which may obscure small masses. FINDINGS: There are no findings suspicious for malignancy. IMPRESSION: No mammographic evidence of malignancy. A result letter of this screening mammogram will be  mailed directly to the patient. RECOMMENDATION: Screening mammogram in one year. (Code:SM-B-01Y) BI-RADS CATEGORY  1: Negative. Electronically Signed   By: Emmaline Kluver M.D.   On: 08/07/2021 16:04   DG Bone Density  Result Date: 08/07/2021 EXAM: DUAL X-RAY ABSORPTIOMETRY (DXA) FOR BONE MINERAL DENSITY IMPRESSION: crr Your patient Cheryl [name] completed a BMD test on 08/07/2021 using the [employer] Advance DXA System (analysis version: 14.10) manufactured by [employer]. The following summarizes the results of our evaluation. PATIENT BIOGRAPHICAL: Name: Cheryl [name], Cheryl [name] Patient ID: [id number] Birth Date: 1956/02/18 Height: 63.5 in. Gender: Female Exam Date: 08/07/2021 Weight: 151.4 lbs. Indications: Advanced Age, Bilateral Ovariectomy, Caucasian, Early Menopause, emphysema, Hysterectomy, kidney disease, Osteoarthritis, POSTmenopausal, Tobacco User Fractures: Treatments: 81 MG ASPIRIN, Calcium, GABAPENTIN, multivitamin, progesterone, Vitamin D ASSESSMENT: The BMD measured at Femur Neck Right is 1.025 g/cm2 with a T-score of -0.1. This  patient is considered normal according to World Health Organization [facility]) criteria. L-4 was excluded due to degenerative changes.The scan quality is limited by exclusion of L-4. Site Region Measured Measured WHO Young Adult BMD Date       Age      Classification T-score AP Spine L1-L3 8/07/[age] 66.1 Normal 0.3 1.206 g/cm2 DualFemur Neck Right 08/07/2021 66.1 Normal -0.1 1.025 g/cm2 World Health Organization [facility]) criteria for post-menopausal, Caucasian Women: Normal:       T-score at or above -1 SD Osteopenia:   T-score between -1 and -2.5 SD Osteoporosis: T-score at or below -2.5 SD RECOMMENDATIONS: 1. All patients should optimize calcium and vitamin D intake. 2. Consider FDA-approved medical therapies in postmenopausal women and men aged 55 years and older, based on the following: a. A hip or vertebral (clinical or morphometric) fracture b. T-score < -2.5 at the femoral neck or spine after appropriate evaluation to exclude secondary causes c. Low bone mass (T-score between -1.0 and -2.5 at the femoral neck or spine) and a 10-year probability of a hip fracture > 3% or a 10-year probability of a major osteoporosis-related fracture > 20% based on the [place]-adapted WHO algorithm d. Clinician judgment and/or patient preferences may indicate treatment for people with 10-year fracture probabilities above or below these levels FOLLOW-UP: People with diagnosed cases of osteoporosis or at high risk for fracture should have regular bone mineral density tests. For patients eligible for Medicare, routine testing is allowed once every 2 years. The testing frequency can be increased to one year for patients who have rapidly progressing disease, those who are receiving or discontinuing medical therapy to restore bone mass, or have additional risk factors. I have reviewed this report, and agree with the above findings. [facility] Electronically Signed   By: Ernie Avena M.D.   On: 08/07/2021 14:43    Assessment &  Plan:   Problem List Items Addressed This Visit     Hyperlipidemia   Renal hypertension - Primary   Other Visit Diagnoses     Long-term use of high-risk medication       Impaired fasting glucose           I spent a total of   minutes with this patient in a face to face visit on the date of this encounter reviewing the last office visit with me in       ,  most recent visit with cardiology ,    ,  patient's diet and exercise habits, home blood pressure /blod sugar readings, recent ER visit including labs and imaging studies ,   and post  visit ordering of testing and therapeutics.    Follow-up: No follow-ups on file.   Crecencio Mc, MD

## 2022-05-03 ENCOUNTER — Ambulatory Visit: Payer: PPO | Admitting: Family Medicine

## 2022-05-03 VITALS — BP 122/70 | HR 70 | Ht 63.0 in | Wt 150.0 lb

## 2022-05-03 DIAGNOSIS — M9903 Segmental and somatic dysfunction of lumbar region: Secondary | ICD-10-CM

## 2022-05-03 DIAGNOSIS — M9901 Segmental and somatic dysfunction of cervical region: Secondary | ICD-10-CM | POA: Diagnosis not present

## 2022-05-03 DIAGNOSIS — M9902 Segmental and somatic dysfunction of thoracic region: Secondary | ICD-10-CM

## 2022-05-03 DIAGNOSIS — K2900 Acute gastritis without bleeding: Secondary | ICD-10-CM | POA: Insufficient documentation

## 2022-05-03 DIAGNOSIS — M9908 Segmental and somatic dysfunction of rib cage: Secondary | ICD-10-CM | POA: Diagnosis not present

## 2022-05-03 DIAGNOSIS — M48061 Spinal stenosis, lumbar region without neurogenic claudication: Secondary | ICD-10-CM | POA: Diagnosis not present

## 2022-05-03 DIAGNOSIS — B9681 Helicobacter pylori [H. pylori] as the cause of diseases classified elsewhere: Secondary | ICD-10-CM | POA: Insufficient documentation

## 2022-05-03 DIAGNOSIS — M9904 Segmental and somatic dysfunction of sacral region: Secondary | ICD-10-CM

## 2022-05-03 HISTORY — DX: Acute gastritis without bleeding: K29.00

## 2022-05-03 LAB — LIPID PANEL
Cholesterol: 108 mg/dL (ref 0–200)
HDL: 29.1 mg/dL — ABNORMAL LOW (ref >39.00)
NonHDL: 78.6
Total CHOL/HDL Ratio: 4
Triglycerides: 273 mg/dL — ABNORMAL HIGH (ref 0.0–149.0)
VLDL: 54.6 mg/dL — ABNORMAL HIGH (ref 0.0–40.0)

## 2022-05-03 LAB — MICROALBUMIN / CREATININE URINE RATIO
Creatinine,U: 146 mg/dL
Microalb Creat Ratio: 0.7 mg/g (ref 0.0–30.0)
Microalb, Ur: 1.1 mg/dL (ref 0.0–1.9)

## 2022-05-03 LAB — COMPREHENSIVE METABOLIC PANEL
ALT: 10 U/L (ref 0–35)
AST: 11 U/L (ref 0–37)
Albumin: 4.4 g/dL (ref 3.5–5.2)
Alkaline Phosphatase: 71 U/L (ref 39–117)
BUN: 14 mg/dL (ref 6–23)
CO2: 26 mEq/L (ref 19–32)
Calcium: 9 mg/dL (ref 8.4–10.5)
Chloride: 104 mEq/L (ref 96–112)
Creatinine, Ser: 1 mg/dL (ref 0.40–1.20)
GFR: 58.57 mL/min — ABNORMAL LOW (ref >60.00)
Glucose, Bld: 92 mg/dL (ref 70–99)
Potassium: 4 mEq/L (ref 3.5–5.1)
Sodium: 137 mEq/L (ref 135–145)
Total Bilirubin: 0.6 mg/dL (ref 0.2–1.2)
Total Protein: 6.2 g/dL (ref 6.0–8.3)

## 2022-05-03 LAB — LDL CHOLESTEROL, DIRECT: Direct LDL: 58 mg/dL

## 2022-05-03 LAB — H. PYLORI ANTIBODY, IGG: H Pylori IgG: POSITIVE — AB

## 2022-05-03 LAB — HEMOGLOBIN A1C: Hgb A1c MFr Bld: 5.5 % (ref 4.6–6.5)

## 2022-05-03 LAB — TSH: TSH: 2.2 u[IU]/mL (ref 0.35–5.50)

## 2022-05-03 LAB — LIPASE: Lipase: 20 U/L (ref 11.0–59.0)

## 2022-05-03 MED ORDER — BISMUTH SUBSALICYLATE 525 MG PO TABS: 1.0000 | ORAL_TABLET | Freq: Four times a day (QID) | ORAL | 0 refills | Status: AC

## 2022-05-03 MED ORDER — TETRACYCLINE HCL 500 MG PO CAPS: 500.0000 mg | ORAL_CAPSULE | Freq: Four times a day (QID) | ORAL | 0 refills | Status: AC

## 2022-05-03 MED ORDER — PANTOPRAZOLE SODIUM 40 MG PO TBEC: 40.0000 mg | DELAYED_RELEASE_TABLET | Freq: Two times a day (BID) | ORAL | 0 refills | Status: DC

## 2022-05-03 MED ORDER — METRONIDAZOLE 500 MG PO TABS: 500.0000 mg | ORAL_TABLET | Freq: Three times a day (TID) | ORAL | 0 refills | Status: AC

## 2022-05-03 NOTE — Patient Instructions (Signed)
Good to see you   Follow up in 6 weeks  

## 2022-05-03 NOTE — Assessment & Plan Note (Addendum)
Known spinal stenosis.  Discussed with patient about different treatment options.  Patient still does not regularly determine injections if she can help it.  Does respond fairly well to osteopathic manipulation.  Trying to avoid too many medications but does have medications we have prescribed including the gabapentin and the meloxicam previously.  Patient tries to avoid the anti-inflammatories no secondary to difficulties.  Follow-up again in 6 weeks

## 2022-05-03 NOTE — Assessment & Plan Note (Signed)
Known spinal stenosis.  Advised to avoid NSAIDs .  Refill history confirmed via [place] Controlled Substance databas, accessed by me today.Cheryl [name]

## 2022-05-03 NOTE — Assessment & Plan Note (Addendum)
Suggested by symptoms of nausea and abd pain improved by eating,  and positive IgG for H Pylori (no prior treatment ).  Will treat with bismuth quadruple regimen given recent metronidazole use for diverticulitis: .  will treat with metronidazole 500 mg tid,  tetracycline 500 mg qid  bismuth subsalicylate 300 mg qid.   And pantoprazole 40 mg bid x 14 days

## 2022-05-03 NOTE — Assessment & Plan Note (Signed)
Lipase normal.  Treating for H pylor infection with bismuth quadruple therapy with tetracycline, metronidazole protonix and bismuth

## 2022-05-08 NOTE — Telephone Encounter (Signed)
MyChart messgae sent to patient. 

## 2022-05-09 NOTE — Telephone Encounter (Signed)
Error

## 2022-05-13 ENCOUNTER — Encounter (INDEPENDENT_AMBULATORY_CARE_PROVIDER_SITE_OTHER): Payer: Self-pay | Admitting: Nurse Practitioner

## 2022-05-13 NOTE — Progress Notes (Signed)
Subjective:    Patient ID: Cheryl [name], female    DOB: 1956/02/02, 66 y.o.   MRN: [id number] Chief Complaint  Patient presents with   Follow-up    Questions about cause of upset stomach    Cheryl [name] is a 66 year old female that presents today for evaluation of symptoms of nausea and vomiting.  She notes that these episodes tend to happen more post eating.  She notes that when she has crackers they sometimes are on her stomach.  The symptoms have recently worsened to the point where it is becoming consistent.  The patient does have a history of peripheral arterial disease.  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  All other systems reviewed and are negative.      Objective:   Physical Exam Vitals reviewed.  HENT:     Head: Normocephalic.  Cardiovascular:     Rate and Rhythm: Normal rate.     Pulses: Normal pulses.  Pulmonary:     Effort: Pulmonary effort is normal.  Skin:    General: Skin is warm and dry.  Neurological:     Mental Status: She is alert and oriented to person, place, and time.  Psychiatric:        Mood and Affect: Mood normal.        Behavior: Behavior normal.        Thought Content: Thought content normal.        Judgment: Judgment normal.     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   Arrhythmia    left bundle branch block   Arthritis    Blood in stool    Chicken pox    Generalized headaches    History of blood transfusion    Kidney disease, chronic, stage III (GFR 30-59 ml/min) (HCC)    LBBB (left bundle branch block)    Renal disorder     Thyroid disease    UTI (lower urinary tract infection)     Social History   Socioeconomic History   Marital status: Married    Spouse name: Not on file   Number of children: Not on file   Years of education: Not on file   Highest education level: Not on file  Occupational History   Not on file  Tobacco Use   Smoking status: Every Day    Packs/day: 0.25    Years: 34.00    Total pack years: 8.50    Types: Cigarettes    Last attempt to quit: 04/10/2013    Years since quitting: 9.0   Smokeless tobacco: Never  Vaping Use   Vaping Use: Never used  Substance and Sexual Activity   Alcohol use: Yes    Comment: occasional   Drug use: No   Sexual activity: Not on file  Other Topics Concern   Not on file  Social History Narrative   Married to 04/12/2013; Works for [employer], [employer]. Quit smoking 2018; ocassional alcohol; lives near to [place].    Social Determinants  of Health   Financial Resource Strain: Low Risk  (07/04/2021)   Overall Financial Resource Strain (CARDIA)    Difficulty of Paying Living Expenses: Not hard at all  Food Insecurity: No Food Insecurity (07/04/2021)   Hunger Vital Sign    Worried About Running Out of Food in the Last Year: Never true    Ran Out of Food in the Last Year: Never true  Transportation Needs: No Transportation Needs (07/04/2021)   PRAPARE - Administrator, Civil ServiceTransportation    Lack of Transportation (Medical): No    Lack of Transportation (Non-Medical): No  Physical Activity: Sufficiently Active (07/04/2021)   Exercise Vital Sign    Days of Exercise per Week: 3 days    Minutes of Exercise per Session: 60 min  Stress: No Stress Concern Present (07/04/2021)   [employer] - Occupational Stress Questionnaire    Feeling of Stress : Not at all  Social Connections: Unknown (07/04/2021)   Social Connection and Isolation Panel [NHANES]    Frequency of Communication with Friends and Family: Not on file    Frequency of Social  Gatherings with Friends and Family: Not on file    Attends Religious Services: Not on file    Active Member of Clubs or Organizations: Not on file    Attends BankerClub or Organization Meetings: Not on file    Marital Status: Married  Intimate Partner Violence: Not At Risk (07/04/2021)   Humiliation, Afraid, Rape, and Kick questionnaire    Fear of Current or Ex-Partner: No    Emotionally Abused: No    Physically Abused: No    Sexually Abused: No    Past Surgical History:  Procedure Laterality Date   ABDOMINAL HYSTERECTOMY  1997   APPENDECTOMY  1981   BREAST EXCISIONAL BIOPSY Bilateral 1976   neg   BREAST SURGERY  1976   CARDIAC CATHETERIZATION  2004   [facility]   CARDIAC CATHETERIZATION  2006   [facility]   cystic fibrosis tumor removal  1983   THUMB AMPUTATION  1992   traumatic   TONSILLECTOMY AND ADENOIDECTOMY  1964    Family History  Problem Relation Age of Onset   Heart disease Mother    Arthritis Mother    Cancer Mother        breast   Hyperlipidemia Mother    Hypertension Mother    Heart attack Mother    Breast cancer Mother [id number]   Heart disease Father    Diabetes Father    Arthritis Father    Hypertension Father    Parkinson's disease Father    Hodgkin's lymphoma Father        hodgkins disease, prostate   Heart disease Sister    Diabetes Sister    Diabetes Maternal Aunt    Heart disease Maternal Grandmother    Diabetes Maternal Grandmother    Heart disease Maternal Grandfather    Heart disease Paternal Grandmother    Diabetes Paternal Grandmother    Stroke Paternal Grandfather    Heart disease Paternal Grandfather    Diabetes Paternal Grandfather    Breast cancer Cousin        2 pat cousins   Heart disease Brother [id number]       ami x 8,  4 vessel CABG    Diabetes Brother    Liver disease Brother    Lung disease Brother     Allergies  Allergen Reactions   Peanuts [Peanut Oil]    Penicillins Other (See Comments)    Hives,rash,nausea,swelling,  Sulfa Antibiotics  Other (See Comments)    Hives,rash,nausea,swelling   Corticosteroids     Other Reaction(s): RASH, SWELLING   Influenza Vac Split [Influenza Virus Vaccine]     Pleurisy  1996   Mobic [Meloxicam] Nausea Only    Renal insufficiency   Nickel    Nsaids Other (See Comments)    Decreased gfr    Penicillin G     Other Reaction(s): RASH , SWELLING   Prednisone Rash       Latest Ref Rng & Units 01/04/2022   10:59 AM 08/01/2021    2:22 PM 01/04/2021    2:36 PM  CBC  WBC 4.0 - 10.5 K/uL 9.7  10.2  9.8   Hemoglobin 12.0 - 15.0 g/dL 06.3  01.6  01.0   Hematocrit 36.0 - 46.0 % 32.7  33.0  33.2   Platelets 150 - 400 K/uL 224  249.0  231       CMP     Component Value Date/Time   NA 137 05/02/2022 1608   NA 139 05/23/2020 1147   K 4.0 05/02/2022 1608   CL 104 05/02/2022 1608   CO2 26 05/02/2022 1608   GLUCOSE 92 05/02/2022 1608   BUN 14 05/02/2022 1608   BUN 11 05/23/2020 1147   CREATININE 1.00 05/02/2022 1608   CREATININE 0.95 09/01/2014 1659   CREATININE 0.80 08/15/2012 1609   CALCIUM 9.0 05/02/2022 1608   CALCIUM 9.7 09/01/2014 1659   PROT 6.2 05/02/2022 1608   PROT 6.5 05/23/2020 1147   PROT 7.0 09/01/2014 1659   ALBUMIN 4.4 05/02/2022 1608   ALBUMIN 4.3 05/23/2020 1147   ALBUMIN 4.3 09/01/2014 1659   AST 11 05/02/2022 1608   AST 21 09/01/2014 1659   ALT 10 05/02/2022 1608   ALT 23 09/01/2014 1659   ALKPHOS 71 05/02/2022 1608   ALKPHOS 77 09/01/2014 1659   BILITOT 0.6 05/02/2022 1608   BILITOT 0.6 05/23/2020 1147   BILITOT 0.6 09/01/2014 1659   GFRNONAA >60 01/04/2022 1059   GFRNONAA >60 09/01/2014 1659   GFRAA 67 05/23/2020 1147   GFRAA >60 09/01/2014 1659     No results found.     Assessment & Plan:   1. Celiac artery stenosis (HCC) Recommend:  The patient has evidence of severe atherosclerotic changes of the mesenteric arteries associated with weight loss as well as abdominal pain and N/V.  This represents a high risk for bowel infarction and  death.  Patient should undergo angiography of the mesenteric arteries with the hope for intervention to eliminate the ischemic symptoms.    The risks and benefits as well as the alternative therapies was discussed in detail with the patient.  All questions were answered.  Patient agrees to proceed with angiography and intervention.  The patient will follow up with me after the angiogram.  2. Mixed hyperlipidemia Continue statin as ordered and reviewed, no changes at this time   Current Outpatient Medications on File Prior to Visit  Medication Sig Dispense Refill   albuterol (PROAIR HFA) 108 (90 Base) MCG/ACT inhaler Inhale 1-2 puffs into the lungs every 6 (six) hours as needed for wheezing or shortness of breath. 8 g 2   aspirin EC 81 MG tablet Take 1 tablet (81 mg total) by mouth daily.     buPROPion (WELLBUTRIN) 75 MG tablet Take 1 tablet by mouth twice daily 180 tablet 0   Calcium Carbonate-Vit D-Min (CALCIUM 1200 PO) Take 1 tablet by mouth daily.     Cholecalciferol (  VITAMIN D) 125 MCG (5000 UT) CAPS Take by mouth.     gabapentin (NEURONTIN) 300 MG capsule Take 1 capsule (300 mg total) by mouth 3 (three) times daily. 270 capsule 3   HYDROcodone-acetaminophen (NORCO/VICODIN) 5-325 MG tablet Take 1 tablet by mouth 2 (two) times daily as needed. 60 tablet 0   metoprolol succinate (TOPROL-XL) 25 MG 24 hr tablet Take 1 tablet (25 mg total) by mouth daily. 90 tablet 3   nitroGLYCERIN (NITROSTAT) 0.4 MG SL tablet DISSOLVE 1 TABLET UNDER THE TONGUE EVERY 5 MINUTES AS  NEEDED FOR CHEST PAIN. MAX  OF 3 TABLETS IN 15 MINUTES. CALL 911 IF PAIN PERSISTS. 100 tablet 3   Prasterone, DHEA, 10 MG CAPS every other day.     progesterone (PROMETRIUM) 200 MG capsule Take 200 mg by mouth at bedtime.     propranolol (INDERAL) 10 MG tablet TAKE 1 TABLET BY MOUTH 3  TIMES DAILY AS NEEDED 180 tablet 5   rOPINIRole (REQUIP) 1 MG tablet TAKE 1 TABLET BY MOUTH THREE TIMES DAILY 90 tablet 1   rosuvastatin (CRESTOR)  10 MG tablet Take 1 tablet (10 mg total) by mouth daily. 90 tablet 0   traMADol (ULTRAM) 50 MG tablet Take 1 tablet (50 mg total) by mouth 2 (two) times daily as needed. for pain 60 tablet 5   venlafaxine XR (EFFEXOR-XR) 75 MG 24 hr capsule TAKE 1 CAPSULE(75 MG) BY MOUTH DAILY WITH BREAKFAST 90 capsule 3   vitamin C (ASCORBIC ACID) 500 MG tablet Take 500 mg by mouth daily.     HYDROcodone-acetaminophen (NORCO/VICODIN) 5-325 MG tablet Take 1 tablet by mouth 2 (two) times daily as needed. 60 tablet 0   metroNIDAZOLE (FLAGYL) 500 MG tablet Take 500 mg by mouth 3 (three) times daily.     No current facility-administered medications on file prior to visit.    There are no Patient Instructions on file for this visit. No follow-ups on file.   Georgiana Spinner, NP

## 2022-05-31 ENCOUNTER — Other Ambulatory Visit: Payer: Self-pay | Admitting: Internal Medicine

## 2022-05-31 ENCOUNTER — Encounter: Payer: Self-pay | Admitting: Internal Medicine

## 2022-05-31 DIAGNOSIS — M48061 Spinal stenosis, lumbar region without neurogenic claudication: Secondary | ICD-10-CM

## 2022-05-31 DIAGNOSIS — N951 Menopausal and female climacteric states: Secondary | ICD-10-CM | POA: Diagnosis not present

## 2022-05-31 MED ORDER — TRAMADOL HCL 50 MG PO TABS: 50.0000 mg | ORAL_TABLET | Freq: Two times a day (BID) | ORAL | 5 refills | Status: DC | PRN

## 2022-05-31 NOTE — Telephone Encounter (Signed)
Duplicate request

## 2022-05-31 NOTE — Telephone Encounter (Signed)
Refilled: 10/31/2021 Last OV: 05/02/2022 Next OV: 08/01/2022

## 2022-06-04 ENCOUNTER — Other Ambulatory Visit: Payer: Self-pay

## 2022-06-04 DIAGNOSIS — M48061 Spinal stenosis, lumbar region without neurogenic claudication: Secondary | ICD-10-CM

## 2022-06-04 MED ORDER — HYDROCODONE-ACETAMINOPHEN 5-325 MG PO TABS: 1.0000 | ORAL_TABLET | Freq: Two times a day (BID) | ORAL | 0 refills | Status: DC | PRN

## 2022-06-04 NOTE — Telephone Encounter (Signed)
Prescription Request  06/04/2022  Is this a "Controlled Substance" medicine? Yes  LOV: Visit date not found  What is the name of the medication or equipment? HYDROcodone-acetaminophen (NORCO/VICODIN) 5-325 MG tablet  Have you contacted your pharmacy to request a refill? Yes   Which pharmacy would you like this sent to?  [facility], [place] [facility] [address] [place] [address] Phone: [phone] Fax: [phone]    Patient notified that their request is being sent to the clinical staff for review and that they should receive a response within 2 business days.   Please advise at Mobile [phone] (mobile)   Patient states she spoke with [facility] in [place] and they state they have not received the refill from Dr. Deborra Medina.  Patient states she is out of this medication.

## 2022-06-04 NOTE — Telephone Encounter (Signed)
Refilled: 05/02/2022 Last OV: 05/02/2022 Next OV: 08/01/2022

## 2022-06-07 DIAGNOSIS — R232 Flushing: Secondary | ICD-10-CM | POA: Diagnosis not present

## 2022-06-07 DIAGNOSIS — R5383 Other fatigue: Secondary | ICD-10-CM | POA: Diagnosis not present

## 2022-06-07 DIAGNOSIS — Z79890 Hormone replacement therapy (postmenopausal): Secondary | ICD-10-CM | POA: Diagnosis not present

## 2022-06-07 DIAGNOSIS — N951 Menopausal and female climacteric states: Secondary | ICD-10-CM | POA: Diagnosis not present

## 2022-06-07 DIAGNOSIS — Z6826 Body mass index (BMI) 26.0-26.9, adult: Secondary | ICD-10-CM | POA: Diagnosis not present

## 2022-06-07 DIAGNOSIS — M2550 Pain in unspecified joint: Secondary | ICD-10-CM | POA: Diagnosis not present

## 2022-06-08 ENCOUNTER — Telehealth (INDEPENDENT_AMBULATORY_CARE_PROVIDER_SITE_OTHER): Payer: Self-pay

## 2022-06-08 NOTE — Telephone Encounter (Signed)
Spoke with the patient on 06/07/22 and she is scheduled with Dr. Delana Meyer on 06/19/22 with a 1:00 pm [facility]. Pre-procedure instructions were discussed and will be mailed.

## 2022-06-13 NOTE — Progress Notes (Signed)
[facility] [address] [place] Phone: [phone] Subjective:   IVilma Meckel, am serving as a scribe for Dr. Hulan Saas.  I'm seeing this patient by the request  of:  Crecencio Mc, MD  CC: Low back and neck pain  BJS:[id number]  Khamryn Calderone is a 67 y.o. female coming in with complaint of back and neck pain. OMT 05/03/2022. Patient states here for manipulation. R shoulder a little problem. No new issues.  Patient states that she is having about the same amount of pain.  Does respond relatively well to the osteopathic manipulation.  Recently has had more discomfort as well.  Rubs it is an aching sensation.  Medications patient has been prescribed: Gabapentin  Taking:         Reviewed prior external information including notes and imaging from previsou exam, outside providers and external EMR if available.   As well as notes that were available from care everywhere and other healthcare systems.  Past medical history, social, surgical and family history all reviewed in electronic medical record.  No pertanent information unless stated regarding to the chief complaint.   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 2017   Arrhythmia    left bundle branch block   Arthritis    Blood in stool    Chicken pox    Generalized headaches    History of blood transfusion    Kidney disease, chronic, stage III (GFR 30-59 ml/min) (HCC)    LBBB (left bundle branch block)    Renal disorder    Thyroid disease    UTI (lower urinary tract infection)     Allergies  Allergen Reactions   Peanuts [Peanut Oil]    Penicillins Other (See Comments)    Hives,rash,nausea,swelling,    Sulfa Antibiotics Other (See Comments)    Hives,rash,nausea,swelling   Corticosteroids      Other Reaction(s): RASH, SWELLING   Influenza Vac Split [Influenza Virus Vaccine]     Pleurisy  1996   Mobic [Meloxicam] Nausea Only    Renal insufficiency   Nickel    Nsaids Other (See Comments)    Decreased gfr    Penicillin G     Other Reaction(s): RASH , SWELLING   Prednisone Rash     Review of Systems:  No headache, visual changes, nausea, vomiting, diarrhea, constipation, dizziness, abdominal pain, skin rash, fevers, chills, night sweats, weight loss, swollen lymph nodes, body aches, joint swelling, chest pain, shortness of breath, mood changes. POSITIVE muscle aches  Objective  Blood pressure 136/76, pulse 86, height 5\' 3"  (1.6 m), weight 152 lb (68.9 kg), SpO2 95 %.   General: No apparent distress alert and oriented x3 mood and affect normal, dressed appropriately.  HEENT: Pupils equal, extraocular movements intact  Respiratory: Patient's speak in full sentences and does not appear short of breath  Cardiovascular: No lower extremity edema, non tender, no erythema  Neck exam does have some limited sidebending bilaterally.  Patient is having some mild abdominal pain noted on exam.  Chronic low back pain noted.  Osteopathic findings  C3 flexed rotated and side bent right C6 flexed rotated and side bent left T3 extended rotated and side bent right inhaled rib T8 extended rotated and side bent left L2 flexed rotated and side bent right Sacrum right on right  Assessment and Plan:  Cervical radiculopathy Increasing tightness in the neck, patient has not been feeling extremely well.  Patient has had difficulty with gastritis as well as being followed for a celiac aneurysm.  Patient is scheduled for an MRI later today.  We discussed continuing to work on Engineer, building services, discussed patient's gabapentin 300 mg 3 times a day patient has had multiple different other allergies to medications as well which does make it difficult.  Follow-up with me again in 6 to 8  weeks    Nonallopathic problems  Decision today to treat with OMT was based on Physical Exam  After verbal consent patient was treated with HVLA, ME, FPR techniques in cervical, rib, thoracic, lumbar, and sacral  areas  Patient tolerated the procedure well with improvement in symptoms  Patient given exercises, stretches and lifestyle modifications  See medications in patient instructions if given  Patient will follow up in 4-8 weeks    The above documentation has been reviewed and is accurate and complete Lyndal Pulley, DO          Note: This dictation was prepared with Dragon dictation along with smaller phrase technology. Any transcriptional errors that result from this process are unintentional.

## 2022-06-19 ENCOUNTER — Other Ambulatory Visit (INDEPENDENT_AMBULATORY_CARE_PROVIDER_SITE_OTHER): Payer: Self-pay | Admitting: Nurse Practitioner

## 2022-06-19 ENCOUNTER — Other Ambulatory Visit: Payer: Self-pay | Admitting: Internal Medicine

## 2022-06-19 ENCOUNTER — Ambulatory Visit: Payer: PPO | Admitting: Family Medicine

## 2022-06-19 VITALS — BP 136/76 | HR 86 | Ht 63.0 in | Wt 152.0 lb

## 2022-06-19 DIAGNOSIS — M9908 Segmental and somatic dysfunction of rib cage: Secondary | ICD-10-CM | POA: Diagnosis not present

## 2022-06-19 DIAGNOSIS — M9902 Segmental and somatic dysfunction of thoracic region: Secondary | ICD-10-CM | POA: Diagnosis not present

## 2022-06-19 DIAGNOSIS — M5412 Radiculopathy, cervical region: Secondary | ICD-10-CM

## 2022-06-19 DIAGNOSIS — M9903 Segmental and somatic dysfunction of lumbar region: Secondary | ICD-10-CM | POA: Diagnosis not present

## 2022-06-19 DIAGNOSIS — I771 Stricture of artery: Secondary | ICD-10-CM

## 2022-06-19 DIAGNOSIS — M9904 Segmental and somatic dysfunction of sacral region: Secondary | ICD-10-CM | POA: Diagnosis not present

## 2022-06-19 DIAGNOSIS — M9901 Segmental and somatic dysfunction of cervical region: Secondary | ICD-10-CM

## 2022-06-19 NOTE — Assessment & Plan Note (Signed)
Increasing tightness in the neck, patient has not been feeling extremely well.  Patient has had difficulty with gastritis as well as being followed for a celiac aneurysm.  Patient is scheduled for an MRI later today.  We discussed continuing to work on Engineer, building services, discussed patient's gabapentin 300 mg 3 times a day patient has had multiple different other allergies to medications as well which does make it difficult.  Follow-up with me again in 6 to 8 weeks

## 2022-06-19 NOTE — Patient Instructions (Signed)
We'll follow up after MRI

## 2022-06-20 ENCOUNTER — Telehealth (INDEPENDENT_AMBULATORY_CARE_PROVIDER_SITE_OTHER): Payer: Self-pay

## 2022-06-20 NOTE — Telephone Encounter (Signed)
I attempted to contact the patient to reschedule or a  mesenteric angio with Dr. Delana Meyer. A message was left for a return call.

## 2022-06-21 NOTE — Telephone Encounter (Signed)
Spoke with the patient and she has been rescheduled with Dr. Delana Meyer to 06/29/22 with a 12:30 pm [facility]. Pre-procedure instructions will be mailed.

## 2022-06-26 ENCOUNTER — Other Ambulatory Visit: Payer: Self-pay | Admitting: Internal Medicine

## 2022-06-26 MED ORDER — ROPINIROLE HCL 1 MG PO TABS: 1.0000 mg | ORAL_TABLET | Freq: Three times a day (TID) | ORAL | 0 refills | Status: DC

## 2022-06-28 DIAGNOSIS — H43811 Vitreous degeneration, right eye: Secondary | ICD-10-CM | POA: Diagnosis not present

## 2022-06-29 ENCOUNTER — Encounter: Payer: Self-pay | Admitting: Vascular Surgery

## 2022-06-29 ENCOUNTER — Other Ambulatory Visit: Payer: Self-pay

## 2022-06-29 ENCOUNTER — Encounter
Admission: RE | Disposition: A | Discharge status: Home / Self Care | Payer: Self-pay | Source: Home / Self Care | Attending: Vascular Surgery

## 2022-06-29 ENCOUNTER — Ambulatory Visit
Admission: RE | Admit: 2022-06-29 | Discharge: 2022-06-29 | Disposition: A | Discharge status: Home / Self Care | Payer: PPO | Attending: Vascular Surgery | Admitting: Vascular Surgery

## 2022-06-29 DIAGNOSIS — I771 Stricture of artery: Secondary | ICD-10-CM

## 2022-06-29 DIAGNOSIS — I774 Celiac artery compression syndrome: Secondary | ICD-10-CM | POA: Insufficient documentation

## 2022-06-29 DIAGNOSIS — I739 Peripheral vascular disease, unspecified: Secondary | ICD-10-CM | POA: Insufficient documentation

## 2022-06-29 DIAGNOSIS — K551 Chronic vascular disorders of intestine: Secondary | ICD-10-CM | POA: Diagnosis not present

## 2022-06-29 DIAGNOSIS — E782 Mixed hyperlipidemia: Secondary | ICD-10-CM | POA: Insufficient documentation

## 2022-06-29 DIAGNOSIS — K559 Vascular disorder of intestine, unspecified: Secondary | ICD-10-CM | POA: Diagnosis not present

## 2022-06-29 HISTORY — PX: VISCERAL ANGIOGRAPHY: CATH118276

## 2022-06-29 LAB — BUN: BUN: 14 mg/dL (ref 8–23)

## 2022-06-29 LAB — CREATININE, SERUM
Creatinine, Ser: 0.89 mg/dL (ref 0.44–1.00)
GFR, Estimated: >60 mL/min (ref >60)

## 2022-06-29 SURGERY — VISCERAL ANGIOGRAPHY
Anesthesia: Moderate Sedation

## 2022-06-29 MED ORDER — DIPHENHYDRAMINE HCL 50 MG/ML IJ SOLN: 50.0000 mg | Freq: Once | INTRAMUSCULAR | Status: DC | PRN

## 2022-06-29 MED ORDER — FENTANYL CITRATE (PF) 100 MCG/2ML IJ SOLN
INTRAMUSCULAR | Status: DC | PRN
  Administered 2022-06-29: 25 ug via INTRAVENOUS
  Administered 2022-06-29: 50 ug via INTRAVENOUS

## 2022-06-29 MED ORDER — ONDANSETRON HCL 4 MG/2ML IJ SOLN: 4.0000 mg | Freq: Four times a day (QID) | INTRAMUSCULAR | Status: DC | PRN

## 2022-06-29 MED ORDER — MIDAZOLAM HCL 5 MG/5ML IJ SOLN
INTRAMUSCULAR | Status: AC
  Filled 2022-06-29: qty 5

## 2022-06-29 MED ORDER — VANCOMYCIN HCL IN DEXTROSE 1-5 GM/200ML-% IV SOLN
INTRAVENOUS | Status: DC | PRN
  Administered 2022-06-29: 1000 mg via INTRAVENOUS

## 2022-06-29 MED ORDER — VANCOMYCIN HCL IN DEXTROSE 1-5 GM/200ML-% IV SOLN: 1000.0000 mg | INTRAVENOUS | Status: DC

## 2022-06-29 MED ORDER — FAMOTIDINE 20 MG PO TABS: 40.0000 mg | ORAL_TABLET | Freq: Once | ORAL | Status: DC | PRN

## 2022-06-29 MED ORDER — VANCOMYCIN HCL IN DEXTROSE 1-5 GM/200ML-% IV SOLN
INTRAVENOUS | Status: AC
  Filled 2022-06-29: qty 200

## 2022-06-29 MED ORDER — MIDAZOLAM HCL 2 MG/2ML IJ SOLN
INTRAMUSCULAR | Status: DC | PRN
  Administered 2022-06-29: 1 mg via INTRAVENOUS
  Administered 2022-06-29: 2 mg via INTRAVENOUS

## 2022-06-29 MED ORDER — SODIUM CHLORIDE 0.9 % IV SOLN: INTRAVENOUS | Status: DC

## 2022-06-29 MED ORDER — FENTANYL CITRATE (PF) 100 MCG/2ML IJ SOLN
INTRAMUSCULAR | Status: AC
  Filled 2022-06-29: qty 2

## 2022-06-29 MED ORDER — IODIXANOL 320 MG/ML IV SOLN
INTRAVENOUS | Status: DC | PRN
  Administered 2022-06-29: 35 mL

## 2022-06-29 MED ORDER — HYDROMORPHONE HCL 1 MG/ML IJ SOLN: 1.0000 mg | Freq: Once | INTRAMUSCULAR | Status: DC | PRN

## 2022-06-29 MED ORDER — HEPARIN SODIUM (PORCINE) 1000 UNIT/ML IJ SOLN
INTRAMUSCULAR | Status: DC | PRN
  Administered 2022-06-29: 6000 [IU] via INTRAVENOUS

## 2022-06-29 MED ORDER — HEPARIN SODIUM (PORCINE) 1000 UNIT/ML IJ SOLN
INTRAMUSCULAR | Status: AC
  Filled 2022-06-29: qty 10

## 2022-06-29 MED ORDER — MIDAZOLAM HCL 2 MG/ML PO SYRP: 8.0000 mg | ORAL_SOLUTION | Freq: Once | ORAL | Status: DC | PRN

## 2022-06-29 SURGICAL SUPPLY — 24 items
CATH ANGIO 5F 80CM MHK1-H (CATHETERS) IMPLANT
CATH ANGIO 5F PIGTAIL 65CM (CATHETERS) IMPLANT
CATH BEACON 5 .035 65 C2 TIP (CATHETERS) IMPLANT
CATH MICROCATH PRGRT 2.8F 110 (CATHETERS) IMPLANT
CATH VS15FR (CATHETERS) IMPLANT
COVER PROBE ULTRASOUND 5X96 (MISCELLANEOUS) IMPLANT
DEVICE STARCLOSE SE CLOSURE (Vascular Products) IMPLANT
DEVICE TORQUE .025-.038 (MISCELLANEOUS) IMPLANT
GLIDECATH 4FR STR (CATHETERS) IMPLANT
GLIDEWIRE ADV .035X180CM (WIRE) IMPLANT
GLIDEWIRE STIFF .35X180X3 HYDR (WIRE) IMPLANT
GUIDEWIRE ADV .018X180CM (WIRE) IMPLANT
GUIDEWIRE ANGLED .035 180CM (WIRE) IMPLANT
MICROCATH PROGREAT 2.8F 110 CM (CATHETERS) ×1
NDL ENTRY 21GA 7CM ECHOTIP (NEEDLE) IMPLANT
NEEDLE ENTRY 21GA 7CM ECHOTIP (NEEDLE) ×1 IMPLANT
PACK ANGIOGRAPHY (CUSTOM PROCEDURE TRAY) ×1 IMPLANT
SET INTRO CAPELLA COAXIAL (SET/KITS/TRAYS/PACK) IMPLANT
SHEATH BRITE TIP 5FRX11 (SHEATH) IMPLANT
SHEATH FLEX ANSEL ANG 6F 45CM (SHEATH) IMPLANT
SHEATH RAABE 6FR (SHEATH) IMPLANT
SYR MEDRAD MARK 7 150ML (SYRINGE) IMPLANT
TUBING CONTRAST HIGH PRESS 72 (TUBING) IMPLANT
WIRE GUIDERIGHT .035X150 (WIRE) IMPLANT

## 2022-06-29 NOTE — H&P (View-Only) (Signed)
MRN : [id number]  Cheryl [name] is a 67 y.o. (1956/04/25) female who presents with chief complaint of check circulation.  History of Present Illness:   Patient presents to 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  She was last seen in the office on May 01, 2022 for evaluation of symptoms of nausea and vomiting. She notes that these episodes tend to happen more post eating. She notes that when she has crackers they sometimes are on her stomach. The symptoms have recently worsened to the point where it is becoming consistent. The patient does have a history of peripheral arterial disease.   Previous studies done in July showed a 60 to 99% stenosis of the celiac artery   Despite a thorough workup there has been no other abnormalities identified with the exception of the celiac stenosis and therefore we have recommended treating this with the hope for alleviating her GI symptoms.   Current Meds  Medication Sig   buPROPion (WELLBUTRIN) 75 MG tablet Take 1 tablet by mouth twice daily   Calcium Carbonate-Vit D-Min (CALCIUM 1200 PO) Take 1 tablet by mouth daily.   Cholecalciferol (VITAMIN D) 125 MCG (5000 UT) CAPS Take by mouth.   docusate sodium (COLACE) 100 MG capsule Take 100 mg by mouth 2 (two) times daily.   gabapentin (NEURONTIN) 300 MG capsule Take 1 capsule (300 mg total) by mouth 3 (three) times daily.   HYDROcodone-acetaminophen (NORCO/VICODIN) 5-325 MG tablet Take 1 tablet by mouth 2 (two) times daily as needed.   metoprolol succinate (TOPROL-XL) 25 MG 24 hr tablet Take 1 tablet (25 mg total) by mouth daily.   pantoprazole (PROTONIX) 40 MG tablet Take 1 tablet (40 mg total) by mouth 2 (two) times daily for 14 days.   progesterone (PROMETRIUM) 200 MG capsule Take 200 mg by mouth at bedtime.   rOPINIRole (REQUIP) 1 MG tablet Take 1 tablet (1 mg total) by mouth 3 (three) times daily.   rosuvastatin (CRESTOR)  10 MG tablet Take 1 tablet (10 mg total) by mouth daily.   venlafaxine XR (EFFEXOR-XR) 75 MG 24 hr capsule TAKE 1 CAPSULE(75 MG) BY MOUTH DAILY WITH BREAKFAST   vitamin C (ASCORBIC ACID) 500 MG tablet Take 500 mg by mouth daily.    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   Arrhythmia    left bundle branch block   Arthritis    Blood in stool    Chicken pox    Generalized headaches    History of blood transfusion    Kidney disease, chronic, stage III (GFR 30-59 ml/min) (HCC)    LBBB (left bundle branch block)    Renal disorder    Thyroid disease    UTI (lower urinary tract infection)     Past Surgical History:  Procedure Laterality Date   [place]   BREAST EXCISIONAL BIOPSY Bilateral 1976   neg   [place]  2004   [facility]   CARDIAC CATHETERIZATION  2006   [facility]   cystic fibrosis tumor removal  [place]   traumatic   [place]  History Social History   Tobacco Use   Smoking status: Every Day    Packs/day: 0.25    Years: 34.00    Total pack years: 8.50    Types: Cigarettes    Last attempt to quit: 04/10/2013    Years since quitting: 9.2   Smokeless tobacco: Never  Vaping Use   Vaping Use: Never used  Substance Use Topics   Alcohol use: Yes    Comment: occasional   Drug use: No    Family History Family History  Problem Relation Age of Onset   Heart disease Mother    Arthritis Mother    Cancer Mother        breast   Hyperlipidemia Mother    Hypertension Mother    Heart attack Mother    Breast cancer Mother 28   Heart disease Father    Diabetes Father    Arthritis Father    Hypertension Father    Parkinson's disease Father    Hodgkin's lymphoma Father         hodgkins disease, prostate   Heart disease Sister    Diabetes Sister    Diabetes Maternal Aunt    Heart disease Maternal Grandmother    Diabetes Maternal Grandmother    Heart disease Maternal Grandfather    Heart disease Paternal Grandmother    Diabetes Paternal Grandmother    Stroke Paternal Grandfather    Heart disease Paternal Grandfather    Diabetes Paternal Grandfather    Breast cancer Cousin        2 pat cousins   Heart disease Brother 32       ami x 8,  4 vessel CABG    Diabetes Brother    Liver disease Brother    Lung disease Brother     Allergies  Allergen Reactions   Peanuts [Peanut Oil]    Penicillins Other (See Comments)    Hives,rash,nausea,swelling,    Sulfa Antibiotics Other (See Comments)    Hives,rash,nausea,swelling   Corticosteroids     Other Reaction(s): RASH, SWELLING   Influenza Vac Split [Influenza Virus Vaccine]     Pleurisy  1996   Mobic [Meloxicam] Nausea Only    Renal insufficiency   Nickel    Nsaids Other (See Comments)    Decreased gfr    Penicillin G     Other Reaction(s): RASH , SWELLING   Prednisone Rash     REVIEW OF SYSTEMS (Negative unless checked)  Constitutional: []$ Weight loss  []$ Fever  []$ Chills Cardiac: []$ Chest pain   []$ Chest pressure   []$ Palpitations   []$ Shortness of breath when laying flat   []$ Shortness of breath with exertion. Vascular:  [x]$ Pain in legs with walking   []$ Pain in legs at rest  []$ History of DVT   []$ Phlebitis   []$ Swelling in legs   []$ Varicose veins   []$ Non-healing ulcers Pulmonary:   []$ Uses home oxygen   []$ Productive cough   []$ Hemoptysis   []$ Wheeze  []$ COPD   []$ Asthma Neurologic:  []$ Dizziness   []$ Seizures   []$ History of stroke   []$ History of TIA  []$ Aphasia   []$ Vissual changes   []$ Weakness or numbness in arm   []$ Weakness or numbness in leg Musculoskeletal:   []$ Joint swelling   []$ Joint pain   []$ Low back pain Hematologic:  []$ Easy bruising  []$ Easy bleeding   []$ Hypercoagulable state    []$ Anemic Gastrointestinal:  []$ Diarrhea   []$ Vomiting  []$ Gastroesophageal reflux/heartburn   []$ Difficulty swallowing. Genitourinary:  []$ Chronic kidney disease   []$ Difficult urination  []$ Frequent urination   []$   Blood in urine Skin:  []$ Rashes   []$ Ulcers  Psychological:  []$ History of anxiety   []$  History of major depression.  Physical Examination  Vitals:   06/29/22 1308  BP: (!) 124/91  Pulse: 73  Resp: 19  Temp: 97.7 F (36.5 C)  TempSrc: Oral  SpO2: 97%  Weight: 68 kg  Height: 5' 3"$  (1.6 m)   Body mass index is 26.57 kg/m. Gen: WD/WN, NAD Head: [place]/AT, No temporalis wasting.  Ear/Nose/Throat: Hearing grossly intact, nares w/o erythema or drainage Eyes: PER, EOMI, sclera nonicteric.  Neck: Supple, no masses.  No bruit or JVD.  Pulmonary:  Good air movement, no audible wheezing, no use of accessory muscles.  Cardiac: RRR, normal S1, S2, no Murmurs. Vascular: No abdominal bruits noted Vessel Right Left  Radial Palpable Palpable  Gastrointestinal: soft, non-distended. No guarding/no peritoneal signs.  Musculoskeletal: M/S 5/5 throughout.  No visible deformity.  Neurologic: CN 2-12 intact. Pain and light touch intact in extremities.  Symmetrical.  Speech is fluent. Motor exam as listed above. Psychiatric: Judgment intact, Mood & affect appropriate for pt's clinical situation. Dermatologic: No rashes or ulcers noted.  No changes consistent with cellulitis.   CBC Lab Results  Component Value Date   WBC 9.7 01/04/2022   HGB 10.2 (L) 01/04/2022   HCT 32.7 (L) 01/04/2022   MCV 72.5 (L) 01/04/2022   PLT 224 01/04/2022    BMET    Component Value Date/Time   NA 137 05/02/2022 1608   NA 139 05/23/2020 1147   K 4.0 05/02/2022 1608   CL 104 05/02/2022 1608   CO2 26 05/02/2022 1608   GLUCOSE 92 05/02/2022 1608   BUN 14 06/29/2022 1313   BUN 11 05/23/2020 1147   CREATININE 0.89 06/29/2022 1313   CREATININE 0.95 09/01/2014 1659   CREATININE 0.80 08/15/2012 1609   CALCIUM 9.0  05/02/2022 1608   CALCIUM 9.7 09/01/2014 1659   GFRNONAA >60 06/29/2022 1313   GFRNONAA >60 09/01/2014 1659   GFRAA 67 05/23/2020 1147   GFRAA >60 09/01/2014 1659   Estimated Creatinine Clearance: 57.5 mL/min (by C-G formula based on SCr of 0.89 mg/dL).  COAG Lab Results  Component Value Date   INR 1.0 09/01/2014    Radiology No results found.   Assessment/Plan 1. Celiac artery stenosis (HCC) Recommend:   The patient has evidence of severe atherosclerotic changes of the mesenteric arteries associated with weight loss as well as abdominal pain and N/V.  This represents a high risk for bowel infarction and death.   Patient should undergo angiography of the mesenteric arteries with the hope for intervention to eliminate the ischemic symptoms.     The risks and benefits as well as the alternative therapies was discussed in detail with the patient.  All questions were answered.  Patient agrees to proceed with angiography and intervention.   The patient will follow up with me after the angiogram.   2. Mixed hyperlipidemia Continue statin as ordered and reviewed, no changes at this time   [name], MD  06/29/2022 1:46 PM

## 2022-06-29 NOTE — Op Note (Signed)
[facility]  Percutaneous Study/Intervention Procedural Note   Date of Surgery:06/29/2022 Surgeon:Orville Widmann  Pre-operative Diagnosis: Ischemic colitis  Post-operative diagnosis:  Same  Procedure(s) Performed:  1.  Abdominal aortogram lateral view  2.  Selective injection of the celiac artery first order catheter placement  3.   Right closure right common femoral artery    Anesthesia: Conscious sedation was administered under my direct supervision by the interventional radiology RN. IV Versed plus fentanyl were utilized. Continuous ECG, pulse oximetry and blood pressure was monitored throughout the entire procedure.  Conscious sedation was for a total of 59 minutes and 42 seconds.  Sheath: 5 French  Contrast: 35 cc  Fluoroscopy Time: 18.7 minutes  Indications:  Cheryl [name] presented with abdominal pain and debilitating nausea and vomiting. CT scan demonstrated celiac artery stenosis. The patient is ruled out for infectious etiologies and therefore the GI service is concerned that this represents ischemic gastritis and has asked [place] to evaluate the patient for mesenteric ischemia. The rest and benefits of angiography with possible intervention have been reviewed, all questions were answered and the patient has agreed to proceed with angiography and possible intervention.  Procedure:  Cheryl [name] is a 67 y.o. y.o. female who was identified and appropriate procedural time out was performed.  The patient was then placed supine on the table and prepped and draped in the usual sterile fashion.    Ultrasound was used to evaluate the right right common femoral artery.  It was pulsatile and echolucent indicating it was patent .  A micropuncture needle was used to access the right common femoral artery under direct ultrasound guidance and an image was recorded for the permanent record.  A 0.035 J wire was advanced without resistance and a 5Fr sheath was  placed.    The pigtail catheter was positioned at the level of T11 and the detector was then maneuvered into a lateral position.  Bolus injection contrast was utilized to demonstrate the origins of the celiac and the SMA. Working in the lateral projection pigtail catheter was exchanged for VS1 catheter over a Glidewire and the celiac artery was selected with the catheter, this represented first order catheter placement.  Unfortunately numerous wires and catheters as well as several different sheaths were utilized in an attempt to cross the greater than 90% stenosis.  Sufficient purchase to move forward with intervention could not be obtained and the case was terminated.  After review these images the catheters removed over wire oblique view of the right groin is obtained and a minx device deployed without difficulty there are no immediate complications.  Findings:   Aortogram:  The abdominal aorta is opacified with a bolus injection contrast. The origin of the celiac appears to be at the level of T1 12 with the SMA just below by approximately 1 cm. In the lateral projection there is no evidence of plaque formation or stenosis of the origins of the SMA but the celiac artery demonstrates a greater than 90% stenosis.  It is a very sharp downward going angle with significant tortuosity of the splenic and hepatic arteries. Common femoral is widely patent and suitable for closure with a Starclose.  Summary: The celiac artery demonstrates a greater than 90% stenosis at its origin.  Unfortunately it is a very sharp downward angle and given the tight stenosis obtaining adequate purchase with the sheath in order to move forward with intervention stent placement from the femoral approach was not feasible.  I  will discuss these findings with the patient in the office and plan for brachial approach under general anesthesia.    Disposition: Patient was taken to the recovery room in stable condition having tolerated  the procedure well.  Cheryl [name] 06/29/2022,3:33 PM

## 2022-06-29 NOTE — Progress Notes (Signed)
MRN : [id number]  Cheryl [name] is a 67 y.o. (1956/04/25) female who presents with chief complaint of check circulation.  History of Present Illness:   Patient presents to 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  She was last seen in the office on May 01, 2022 for evaluation of symptoms of nausea and vomiting. She notes that these episodes tend to happen more post eating. She notes that when she has crackers they sometimes are on her stomach. The symptoms have recently worsened to the point where it is becoming consistent. The patient does have a history of peripheral arterial disease.   Previous studies done in July showed a 60 to 99% stenosis of the celiac artery   Despite a thorough workup there has been no other abnormalities identified with the exception of the celiac stenosis and therefore we have recommended treating this with the hope for alleviating her GI symptoms.   Current Meds  Medication Sig   buPROPion (WELLBUTRIN) 75 MG tablet Take 1 tablet by mouth twice daily   Calcium Carbonate-Vit D-Min (CALCIUM 1200 PO) Take 1 tablet by mouth daily.   Cholecalciferol (VITAMIN D) 125 MCG (5000 UT) CAPS Take by mouth.   docusate sodium (COLACE) 100 MG capsule Take 100 mg by mouth 2 (two) times daily.   gabapentin (NEURONTIN) 300 MG capsule Take 1 capsule (300 mg total) by mouth 3 (three) times daily.   HYDROcodone-acetaminophen (NORCO/VICODIN) 5-325 MG tablet Take 1 tablet by mouth 2 (two) times daily as needed.   metoprolol succinate (TOPROL-XL) 25 MG 24 hr tablet Take 1 tablet (25 mg total) by mouth daily.   pantoprazole (PROTONIX) 40 MG tablet Take 1 tablet (40 mg total) by mouth 2 (two) times daily for 14 days.   progesterone (PROMETRIUM) 200 MG capsule Take 200 mg by mouth at bedtime.   rOPINIRole (REQUIP) 1 MG tablet Take 1 tablet (1 mg total) by mouth 3 (three) times daily.   rosuvastatin (CRESTOR)  10 MG tablet Take 1 tablet (10 mg total) by mouth daily.   venlafaxine XR (EFFEXOR-XR) 75 MG 24 hr capsule TAKE 1 CAPSULE(75 MG) BY MOUTH DAILY WITH BREAKFAST   vitamin C (ASCORBIC ACID) 500 MG tablet Take 500 mg by mouth daily.    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   Arrhythmia    left bundle branch block   Arthritis    Blood in stool    Chicken pox    Generalized headaches    History of blood transfusion    Kidney disease, chronic, stage III (GFR 30-59 ml/min) (HCC)    LBBB (left bundle branch block)    Renal disorder    Thyroid disease    UTI (lower urinary tract infection)     Past Surgical History:  Procedure Laterality Date   [place]   BREAST EXCISIONAL BIOPSY Bilateral 1976   neg   [place]  2004   [facility]   CARDIAC CATHETERIZATION  2006   [facility]   cystic fibrosis tumor removal  [place]   traumatic   [place]  History Social History   Tobacco Use   Smoking status: Every Day    Packs/day: 0.25    Years: 34.00    Total pack years: 8.50    Types: Cigarettes    Last attempt to quit: 04/10/2013    Years since quitting: 9.2   Smokeless tobacco: Never  Vaping Use   Vaping Use: Never used  Substance Use Topics   Alcohol use: Yes    Comment: occasional   Drug use: No    Family History Family History  Problem Relation Age of Onset   Heart disease Mother    Arthritis Mother    Cancer Mother        breast   Hyperlipidemia Mother    Hypertension Mother    Heart attack Mother    Breast cancer Mother 28   Heart disease Father    Diabetes Father    Arthritis Father    Hypertension Father    Parkinson's disease Father    Hodgkin's lymphoma Father         hodgkins disease, prostate   Heart disease Sister    Diabetes Sister    Diabetes Maternal Aunt    Heart disease Maternal Grandmother    Diabetes Maternal Grandmother    Heart disease Maternal Grandfather    Heart disease Paternal Grandmother    Diabetes Paternal Grandmother    Stroke Paternal Grandfather    Heart disease Paternal Grandfather    Diabetes Paternal Grandfather    Breast cancer Cousin        2 pat cousins   Heart disease Brother 32       ami x 8,  4 vessel CABG    Diabetes Brother    Liver disease Brother    Lung disease Brother     Allergies  Allergen Reactions   Peanuts [Peanut Oil]    Penicillins Other (See Comments)    Hives,rash,nausea,swelling,    Sulfa Antibiotics Other (See Comments)    Hives,rash,nausea,swelling   Corticosteroids     Other Reaction(s): RASH, SWELLING   Influenza Vac Split [Influenza Virus Vaccine]     Pleurisy  1996   Mobic [Meloxicam] Nausea Only    Renal insufficiency   Nickel    Nsaids Other (See Comments)    Decreased gfr    Penicillin G     Other Reaction(s): RASH , SWELLING   Prednisone Rash     REVIEW OF SYSTEMS (Negative unless checked)  Constitutional: []$ Weight loss  []$ Fever  []$ Chills Cardiac: []$ Chest pain   []$ Chest pressure   []$ Palpitations   []$ Shortness of breath when laying flat   []$ Shortness of breath with exertion. Vascular:  [x]$ Pain in legs with walking   []$ Pain in legs at rest  []$ History of DVT   []$ Phlebitis   []$ Swelling in legs   []$ Varicose veins   []$ Non-healing ulcers Pulmonary:   []$ Uses home oxygen   []$ Productive cough   []$ Hemoptysis   []$ Wheeze  []$ COPD   []$ Asthma Neurologic:  []$ Dizziness   []$ Seizures   []$ History of stroke   []$ History of TIA  []$ Aphasia   []$ Vissual changes   []$ Weakness or numbness in arm   []$ Weakness or numbness in leg Musculoskeletal:   []$ Joint swelling   []$ Joint pain   []$ Low back pain Hematologic:  []$ Easy bruising  []$ Easy bleeding   []$ Hypercoagulable state    []$ Anemic Gastrointestinal:  []$ Diarrhea   []$ Vomiting  []$ Gastroesophageal reflux/heartburn   []$ Difficulty swallowing. Genitourinary:  []$ Chronic kidney disease   []$ Difficult urination  []$ Frequent urination   []$   Blood in urine Skin:  []$ Rashes   []$ Ulcers  Psychological:  []$ History of anxiety   []$  History of major depression.  Physical Examination  Vitals:   06/29/22 1308  BP: (!) 124/91  Pulse: 73  Resp: 19  Temp: 97.7 F (36.5 C)  TempSrc: Oral  SpO2: 97%  Weight: 68 kg  Height: 5' 3"$  (1.6 m)   Body mass index is 26.57 kg/m. Gen: WD/WN, NAD Head: [place]/AT, No temporalis wasting.  Ear/Nose/Throat: Hearing grossly intact, nares w/o erythema or drainage Eyes: PER, EOMI, sclera nonicteric.  Neck: Supple, no masses.  No bruit or JVD.  Pulmonary:  Good air movement, no audible wheezing, no use of accessory muscles.  Cardiac: RRR, normal S1, S2, no Murmurs. Vascular: No abdominal bruits noted Vessel Right Left  Radial Palpable Palpable  Gastrointestinal: soft, non-distended. No guarding/no peritoneal signs.  Musculoskeletal: M/S 5/5 throughout.  No visible deformity.  Neurologic: CN 2-12 intact. Pain and light touch intact in extremities.  Symmetrical.  Speech is fluent. Motor exam as listed above. Psychiatric: Judgment intact, Mood & affect appropriate for pt's clinical situation. Dermatologic: No rashes or ulcers noted.  No changes consistent with cellulitis.   CBC Lab Results  Component Value Date   WBC 9.7 01/04/2022   HGB 10.2 (L) 01/04/2022   HCT 32.7 (L) 01/04/2022   MCV 72.5 (L) 01/04/2022   PLT 224 01/04/2022    BMET    Component Value Date/Time   NA 137 05/02/2022 1608   NA 139 05/23/2020 1147   K 4.0 05/02/2022 1608   CL 104 05/02/2022 1608   CO2 26 05/02/2022 1608   GLUCOSE 92 05/02/2022 1608   BUN 14 06/29/2022 1313   BUN 11 05/23/2020 1147   CREATININE 0.89 06/29/2022 1313   CREATININE 0.95 09/01/2014 1659   CREATININE 0.80 08/15/2012 1609   CALCIUM 9.0  05/02/2022 1608   CALCIUM 9.7 09/01/2014 1659   GFRNONAA >60 06/29/2022 1313   GFRNONAA >60 09/01/2014 1659   GFRAA 67 05/23/2020 1147   GFRAA >60 09/01/2014 1659   Estimated Creatinine Clearance: 57.5 mL/min (by C-G formula based on SCr of 0.89 mg/dL).  COAG Lab Results  Component Value Date   INR 1.0 09/01/2014    Radiology No results found.   Assessment/Plan 1. Celiac artery stenosis (HCC) Recommend:   The patient has evidence of severe atherosclerotic changes of the mesenteric arteries associated with weight loss as well as abdominal pain and N/V.  This represents a high risk for bowel infarction and death.   Patient should undergo angiography of the mesenteric arteries with the hope for intervention to eliminate the ischemic symptoms.     The risks and benefits as well as the alternative therapies was discussed in detail with the patient.  All questions were answered.  Patient agrees to proceed with angiography and intervention.   The patient will follow up with me after the angiogram.   2. Mixed hyperlipidemia Continue statin as ordered and reviewed, no changes at this time   [name], MD  06/29/2022 1:46 PM

## 2022-06-29 NOTE — H&P (View-Only) (Signed)
MRN : [id number]  Cheryl [name] is a 67 y.o. (1956/04/25) female who presents with chief complaint of check circulation.  History of Present Illness:   Patient presents to 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  She was last seen in the office on May 01, 2022 for evaluation of symptoms of nausea and vomiting. She notes that these episodes tend to happen more post eating. She notes that when she has crackers they sometimes are on her stomach. The symptoms have recently worsened to the point where it is becoming consistent. The patient does have a history of peripheral arterial disease.   Previous studies done in July showed a 60 to 99% stenosis of the celiac artery   Despite a thorough workup there has been no other abnormalities identified with the exception of the celiac stenosis and therefore we have recommended treating this with the hope for alleviating her GI symptoms.   Current Meds  Medication Sig   buPROPion (WELLBUTRIN) 75 MG tablet Take 1 tablet by mouth twice daily   Calcium Carbonate-Vit D-Min (CALCIUM 1200 PO) Take 1 tablet by mouth daily.   Cholecalciferol (VITAMIN D) 125 MCG (5000 UT) CAPS Take by mouth.   docusate sodium (COLACE) 100 MG capsule Take 100 mg by mouth 2 (two) times daily.   gabapentin (NEURONTIN) 300 MG capsule Take 1 capsule (300 mg total) by mouth 3 (three) times daily.   HYDROcodone-acetaminophen (NORCO/VICODIN) 5-325 MG tablet Take 1 tablet by mouth 2 (two) times daily as needed.   metoprolol succinate (TOPROL-XL) 25 MG 24 hr tablet Take 1 tablet (25 mg total) by mouth daily.   pantoprazole (PROTONIX) 40 MG tablet Take 1 tablet (40 mg total) by mouth 2 (two) times daily for 14 days.   progesterone (PROMETRIUM) 200 MG capsule Take 200 mg by mouth at bedtime.   rOPINIRole (REQUIP) 1 MG tablet Take 1 tablet (1 mg total) by mouth 3 (three) times daily.   rosuvastatin (CRESTOR)  10 MG tablet Take 1 tablet (10 mg total) by mouth daily.   venlafaxine XR (EFFEXOR-XR) 75 MG 24 hr capsule TAKE 1 CAPSULE(75 MG) BY MOUTH DAILY WITH BREAKFAST   vitamin C (ASCORBIC ACID) 500 MG tablet Take 500 mg by mouth daily.    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   Arrhythmia    left bundle branch block   Arthritis    Blood in stool    Chicken pox    Generalized headaches    History of blood transfusion    Kidney disease, chronic, stage III (GFR 30-59 ml/min) (HCC)    LBBB (left bundle branch block)    Renal disorder    Thyroid disease    UTI (lower urinary tract infection)     Past Surgical History:  Procedure Laterality Date   [place]   BREAST EXCISIONAL BIOPSY Bilateral 1976   neg   [place]  2004   [facility]   CARDIAC CATHETERIZATION  2006   [facility]   cystic fibrosis tumor removal  [place]   traumatic   [place]  History Social History   Tobacco Use   Smoking status: Every Day    Packs/day: 0.25    Years: 34.00    Total pack years: 8.50    Types: Cigarettes    Last attempt to quit: 04/10/2013    Years since quitting: 9.2   Smokeless tobacco: Never  Vaping Use   Vaping Use: Never used  Substance Use Topics   Alcohol use: Yes    Comment: occasional   Drug use: No    Family History Family History  Problem Relation Age of Onset   Heart disease Mother    Arthritis Mother    Cancer Mother        breast   Hyperlipidemia Mother    Hypertension Mother    Heart attack Mother    Breast cancer Mother 28   Heart disease Father    Diabetes Father    Arthritis Father    Hypertension Father    Parkinson's disease Father    Hodgkin's lymphoma Father         hodgkins disease, prostate   Heart disease Sister    Diabetes Sister    Diabetes Maternal Aunt    Heart disease Maternal Grandmother    Diabetes Maternal Grandmother    Heart disease Maternal Grandfather    Heart disease Paternal Grandmother    Diabetes Paternal Grandmother    Stroke Paternal Grandfather    Heart disease Paternal Grandfather    Diabetes Paternal Grandfather    Breast cancer Cousin        2 pat cousins   Heart disease Brother 32       ami x 8,  4 vessel CABG    Diabetes Brother    Liver disease Brother    Lung disease Brother     Allergies  Allergen Reactions   Peanuts [Peanut Oil]    Penicillins Other (See Comments)    Hives,rash,nausea,swelling,    Sulfa Antibiotics Other (See Comments)    Hives,rash,nausea,swelling   Corticosteroids     Other Reaction(s): RASH, SWELLING   Influenza Vac Split [Influenza Virus Vaccine]     Pleurisy  1996   Mobic [Meloxicam] Nausea Only    Renal insufficiency   Nickel    Nsaids Other (See Comments)    Decreased gfr    Penicillin G     Other Reaction(s): RASH , SWELLING   Prednisone Rash     REVIEW OF SYSTEMS (Negative unless checked)  Constitutional: []$ Weight loss  []$ Fever  []$ Chills Cardiac: []$ Chest pain   []$ Chest pressure   []$ Palpitations   []$ Shortness of breath when laying flat   []$ Shortness of breath with exertion. Vascular:  [x]$ Pain in legs with walking   []$ Pain in legs at rest  []$ History of DVT   []$ Phlebitis   []$ Swelling in legs   []$ Varicose veins   []$ Non-healing ulcers Pulmonary:   []$ Uses home oxygen   []$ Productive cough   []$ Hemoptysis   []$ Wheeze  []$ COPD   []$ Asthma Neurologic:  []$ Dizziness   []$ Seizures   []$ History of stroke   []$ History of TIA  []$ Aphasia   []$ Vissual changes   []$ Weakness or numbness in arm   []$ Weakness or numbness in leg Musculoskeletal:   []$ Joint swelling   []$ Joint pain   []$ Low back pain Hematologic:  []$ Easy bruising  []$ Easy bleeding   []$ Hypercoagulable state    []$ Anemic Gastrointestinal:  []$ Diarrhea   []$ Vomiting  []$ Gastroesophageal reflux/heartburn   []$ Difficulty swallowing. Genitourinary:  []$ Chronic kidney disease   []$ Difficult urination  []$ Frequent urination   []$   Blood in urine Skin:  []$ Rashes   []$ Ulcers  Psychological:  []$ History of anxiety   []$  History of major depression.  Physical Examination  Vitals:   06/29/22 1308  BP: (!) 124/91  Pulse: 73  Resp: 19  Temp: 97.7 F (36.5 C)  TempSrc: Oral  SpO2: 97%  Weight: 68 kg  Height: 5' 3"$  (1.6 m)   Body mass index is 26.57 kg/m. Gen: WD/WN, NAD Head:  AFB/AT, No temporalis wasting.  Ear/Nose/Throat: Hearing grossly intact, nares w/o erythema or drainage Eyes: PER, EOMI, sclera nonicteric.  Neck: Supple, no masses.  No bruit or JVD.  Pulmonary:  Good air movement, no audible wheezing, no use of accessory muscles.  Cardiac: RRR, normal S1, S2, no Murmurs. Vascular: No abdominal bruits noted Vessel Right Left  Radial Palpable Palpable  Gastrointestinal: soft, non-distended. No guarding/no peritoneal signs.  Musculoskeletal: M/S 5/5 throughout.  No visible deformity.  Neurologic: CN 2-12 intact. Pain and light touch intact in extremities.  Symmetrical.  Speech is fluent. Motor exam as listed above. Psychiatric: Judgment intact, Mood & affect appropriate for pt's clinical situation. Dermatologic: No rashes or ulcers noted.  No changes consistent with cellulitis.   CBC Lab Results  Component Value Date   WBC 9.7 01/04/2022   HGB 10.2 (L) 01/04/2022   HCT 32.7 (L) 01/04/2022   MCV 72.5 (L) 01/04/2022   PLT 224 01/04/2022    BMET    Component Value Date/Time   NA 137 05/02/2022 1608   NA 139 05/23/2020 1147   K 4.0 05/02/2022 1608   CL 104 05/02/2022 1608   CO2 26 05/02/2022 1608   GLUCOSE 92 05/02/2022 1608   BUN 14 06/29/2022 1313   BUN 11 05/23/2020 1147   CREATININE 0.89 06/29/2022 1313   CREATININE 0.95 09/01/2014 1659   CREATININE 0.80 08/15/2012 1609   CALCIUM 9.0  05/02/2022 1608   CALCIUM 9.7 09/01/2014 1659   GFRNONAA >60 06/29/2022 1313   GFRNONAA >60 09/01/2014 1659   GFRAA 67 05/23/2020 1147   GFRAA >60 09/01/2014 1659   Estimated Creatinine Clearance: 57.5 mL/min (by C-G formula based on SCr of 0.89 mg/dL).  COAG Lab Results  Component Value Date   INR 1.0 09/01/2014    Radiology No results found.   Assessment/Plan 1. Celiac artery stenosis (HCC) Recommend:   The patient has evidence of severe atherosclerotic changes of the mesenteric arteries associated with weight loss as well as abdominal pain and N/V.  This represents a high risk for bowel infarction and death.   Patient should undergo angiography of the mesenteric arteries with the hope for intervention to eliminate the ischemic symptoms.     The risks and benefits as well as the alternative therapies was discussed in detail with the patient.  All questions were answered.  Patient agrees to proceed with angiography and intervention.   The patient will follow up with me after the angiogram.   2. Mixed hyperlipidemia Continue statin as ordered and reviewed, no changes at this time   [name], MD  06/29/2022 1:46 PM

## 2022-06-29 NOTE — Interval H&P Note (Signed)
History and Physical Interval Note:  06/29/2022 1:49 PM  Cheryl [name]  has presented today for surgery, with the diagnosis of Mesenteric Angio   Celiac artery stenosis.  The various methods of treatment have been discussed with the patient and family. After consideration of risks, benefits and other options for treatment, the patient has consented to  Procedure(s): VISCERAL ANGIOGRAPHY (N/A) as a surgical intervention.  The patient's history has been reviewed, patient examined, no change in status, stable for surgery.  I have reviewed the patient's chart and labs.  Questions were answered to the patient's satisfaction.     [name]

## 2022-07-02 ENCOUNTER — Encounter: Payer: Self-pay | Admitting: Vascular Surgery

## 2022-07-03 ENCOUNTER — Other Ambulatory Visit: Payer: Self-pay

## 2022-07-03 ENCOUNTER — Encounter: Payer: Self-pay | Admitting: Internal Medicine

## 2022-07-03 ENCOUNTER — Encounter: Payer: Self-pay | Admitting: Family Medicine

## 2022-07-03 MED ORDER — GABAPENTIN 300 MG PO CAPS: 300.0000 mg | ORAL_CAPSULE | Freq: Three times a day (TID) | ORAL | 3 refills | Status: DC

## 2022-07-04 ENCOUNTER — Other Ambulatory Visit: Payer: Self-pay

## 2022-07-04 ENCOUNTER — Telehealth: Payer: Self-pay | Admitting: Internal Medicine

## 2022-07-04 MED ORDER — BUPROPION HCL 75 MG PO TABS: 75.0000 mg | ORAL_TABLET | Freq: Two times a day (BID) | ORAL | 0 refills | Status: DC

## 2022-07-04 NOTE — Progress Notes (Signed)
MRN : [id number]  Cheryl [name] is a 67 y.o. (Dec 22, 1955) female who presents with chief complaint of check circulation.  History of Present Illness:   The patient returns to the office for followup and review status post angiogram without intervention on June 29, 2022.   Procedure: Selective injection of the celiac artery first order catheter placement   The patient notes her nausea and vomiting has continued her abdominal pain continues as well.  There have been no significant changes to the patient's overall health care.  No documented history of amaurosis fugax or recent TIA symptoms. There are no recent neurological changes noted. No documented history of DVT, PE or superficial thrombophlebitis. The patient denies recent episodes of angina or shortness of breath.     No outpatient medications have been marked as taking for the 07/05/22 encounter (Appointment) with Delana Meyer, Dolores Lory, MD.    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   Arrhythmia    left bundle branch block   Arthritis    Blood in stool    Chicken pox    Generalized headaches    History of blood transfusion    Kidney disease, chronic, stage III (GFR 30-59 ml/min) (HCC)    LBBB (left bundle branch block)    Renal disorder    Thyroid disease    UTI (lower urinary tract infection)     Past Surgical History:  Procedure Laterality Date   [place]   BREAST EXCISIONAL BIOPSY Bilateral 1976   neg   [place]  2004   [facility]   CARDIAC CATHETERIZATION  2006   [facility]   cystic fibrosis tumor removal  [place]   traumatic   [place] N/A 06/29/2022   Procedure: VISCERAL  ANGIOGRAPHY;  Surgeon: Katha Cabal, MD;  Location: [place] [facility];  Service: Cardiovascular;  Laterality: N/A;    Social History Social History   Tobacco Use   Smoking status: Every Day    Packs/day: 0.25    Years: 34.00    Total pack years: 8.50    Types: Cigarettes    Last attempt to quit: 04/10/2013    Years since quitting: 9.2   Smokeless tobacco: Never  Vaping Use   Vaping Use: Never used  Substance Use Topics   Alcohol use: Yes    Comment: occasional   Drug use: No    Family History Family History  Problem Relation Age of Onset   Heart disease Mother    Arthritis Mother    Cancer Mother        breast   Hyperlipidemia Mother    Hypertension Mother    Heart attack Mother    Breast cancer Mother 82   Heart disease Father    Diabetes Father    Arthritis Father    Hypertension Father    Parkinson's disease Father    Hodgkin's lymphoma Father        hodgkins disease, prostate   Heart disease Sister    Diabetes Sister    Diabetes Maternal Aunt    Heart disease Maternal Grandmother    Diabetes Maternal Grandmother    Heart disease Maternal Grandfather    Heart disease  Paternal Grandmother    Diabetes Paternal Grandmother    Stroke Paternal Grandfather    Heart disease Paternal Grandfather    Diabetes Paternal Grandfather    Breast cancer Cousin        2 pat cousins   Heart disease Brother 36       ami x 8,  4 vessel CABG    Diabetes Brother    Liver disease Brother    Lung disease Brother     Allergies  Allergen Reactions   Peanuts [Peanut Oil]    Penicillins Other (See Comments)    Hives,rash,nausea,swelling,    Sulfa Antibiotics Other (See Comments)    Hives,rash,nausea,swelling   Corticosteroids     Other Reaction(s): RASH, SWELLING   Influenza Vac Split [Influenza Virus Vaccine]     Pleurisy  1996   Mobic [Meloxicam] Nausea Only    Renal insufficiency   Nickel    Nsaids Other (See Comments)    Decreased gfr    Penicillin G      Other Reaction(s): RASH , SWELLING   Prednisone Rash     REVIEW OF SYSTEMS (Negative unless checked)  Constitutional: []$ Weight loss  []$ Fever  []$ Chills Cardiac: []$ Chest pain   []$ Chest pressure   []$ Palpitations   []$ Shortness of breath when laying flat   []$ Shortness of breath with exertion. Vascular:  [x]$ Pain in legs with walking   []$ Pain in legs at rest  []$ History of DVT   []$ Phlebitis   []$ Swelling in legs   []$ Varicose veins   []$ Non-healing ulcers Pulmonary:   []$ Uses home oxygen   []$ Productive cough   []$ Hemoptysis   []$ Wheeze  []$ COPD   []$ Asthma Neurologic:  []$ Dizziness   []$ Seizures   []$ History of stroke   []$ History of TIA  []$ Aphasia   []$ Vissual changes   []$ Weakness or numbness in arm   []$ Weakness or numbness in leg Musculoskeletal:   []$ Joint swelling   []$ Joint pain   []$ Low back pain Hematologic:  []$ Easy bruising  []$ Easy bleeding   []$ Hypercoagulable state   []$ Anemic Gastrointestinal:  []$ Diarrhea   [x]$ Vomiting  []$ Gastroesophageal reflux/heartburn   []$ Difficulty swallowing. Genitourinary:  []$ Chronic kidney disease   []$ Difficult urination  []$ Frequent urination   []$ Blood in urine Skin:  []$ Rashes   []$ Ulcers  Psychological:  []$ History of anxiety   []$  History of major depression.  Physical Examination  There were no vitals filed for this visit. There is no height or weight on file to calculate BMI. Gen: WD/WN, NAD Head: [place]/AT, No temporalis wasting.  Ear/Nose/Throat: Hearing grossly intact, nares w/o erythema or drainage Eyes: PER, EOMI, sclera nonicteric.  Neck: Supple, no masses.  No bruit or JVD.  Pulmonary:  Good air movement, no audible wheezing, no use of accessory muscles.  Cardiac: RRR, normal S1, S2, no Murmurs. Vascular:   Vessel Right Left  Radial Palpable Palpable  Gastrointestinal: soft, non-distended. No guarding/no peritoneal signs.  Musculoskeletal: M/S 5/5 throughout.  No visible deformity.  Neurologic: CN 2-12 intact. Pain and light touch intact in extremities.   Symmetrical.  Speech is fluent. Motor exam as listed above. Psychiatric: Judgment intact, Mood & affect appropriate for pt's clinical situation. Dermatologic: No rashes or ulcers noted.  No changes consistent with cellulitis.   CBC Lab Results  Component Value Date   WBC 9.7 01/04/2022   HGB 10.2 (L) 01/04/2022   HCT 32.7 (L) 01/04/2022   MCV 72.5 (L) 01/04/2022   PLT 224 01/04/2022    BMET    Component Value Date/Time   NA 137  05/02/2022 1608   NA 139 05/23/2020 1147   K 4.0 05/02/2022 1608   CL 104 05/02/2022 1608   CO2 26 05/02/2022 1608   GLUCOSE 92 05/02/2022 1608   BUN 14 06/29/2022 1313   BUN 11 05/23/2020 1147   CREATININE 0.89 06/29/2022 1313   CREATININE 0.95 09/01/2014 1659   CREATININE 0.80 08/15/2012 1609   CALCIUM 9.0 05/02/2022 1608   CALCIUM 9.7 09/01/2014 1659   GFRNONAA >60 06/29/2022 1313   GFRNONAA >60 09/01/2014 1659   GFRAA 67 05/23/2020 1147   GFRAA >60 09/01/2014 1659   Estimated Creatinine Clearance: 56.7 mL/min (by C-G formula based on SCr of 0.89 mg/dL).  COAG Lab Results  Component Value Date   INR 1.0 09/01/2014    Radiology PERIPHERAL VASCULAR CATHETERIZATION  Result Date: 06/29/2022 See surgical note for result.    Assessment/Plan 1. Celiac artery stenosis (HCC) Recent angiogram was unsuccessful.  We will reattempt with a different approach coming from the brachial artery as well as a femoral approach in order to treat the celiac artery lesion.  We have discussed the risk, benefits and alternatives and the patient is agreeable to move forward to proceed.  2. Mixed hyperlipidemia Continue statin as ordered and reviewed, no changes at this time  3. Bilateral carotid artery stenosis Patient recently had noninvasive studies in July.  Minimal stenosis noted bilaterally.  We will continue to follow this annually.  Patient to continue with statin and antiplatelet    [name], MD  07/04/2022 9:02 AM

## 2022-07-04 NOTE — Telephone Encounter (Signed)
Left message for patient to call back and schedule Medicare Annual Wellness Visit (AWV.   Please offer to do by telephone.  Left office number and my jabber #[phone].  Last AWV:07/04/2021   Please schedule at anytime with Nurse Health Advisor.  

## 2022-07-05 ENCOUNTER — Ambulatory Visit (INDEPENDENT_AMBULATORY_CARE_PROVIDER_SITE_OTHER): Payer: PPO | Admitting: Vascular Surgery

## 2022-07-05 ENCOUNTER — Encounter (INDEPENDENT_AMBULATORY_CARE_PROVIDER_SITE_OTHER): Payer: Self-pay | Admitting: Nurse Practitioner

## 2022-07-05 ENCOUNTER — Ambulatory Visit (INDEPENDENT_AMBULATORY_CARE_PROVIDER_SITE_OTHER): Payer: PPO | Admitting: Nurse Practitioner

## 2022-07-05 ENCOUNTER — Telehealth (INDEPENDENT_AMBULATORY_CARE_PROVIDER_SITE_OTHER): Payer: Self-pay

## 2022-07-05 VITALS — BP 152/72 | HR 68 | Resp 16 | Wt 151.0 lb

## 2022-07-05 DIAGNOSIS — I6523 Occlusion and stenosis of bilateral carotid arteries: Secondary | ICD-10-CM | POA: Diagnosis not present

## 2022-07-05 DIAGNOSIS — I728 Aneurysm of other specified arteries: Secondary | ICD-10-CM

## 2022-07-05 DIAGNOSIS — I739 Peripheral vascular disease, unspecified: Secondary | ICD-10-CM

## 2022-07-05 DIAGNOSIS — E782 Mixed hyperlipidemia: Secondary | ICD-10-CM

## 2022-07-05 DIAGNOSIS — I771 Stricture of artery: Secondary | ICD-10-CM | POA: Diagnosis not present

## 2022-07-05 NOTE — Telephone Encounter (Signed)
I attempted to contact the patient to reschedule her for a mesenteric angio with arm approach and brachial cutdown wit anesthesia with Dr. Delana Meyer. A message was left for a return call.

## 2022-07-05 NOTE — Telephone Encounter (Signed)
Patient returned my call and is scheduled with Dr. Delana Meyer on 07/17/22 with a 1:00 pm [facility] mesenterica angio. Pre-procedure instructions were discussed and will be mailed.

## 2022-07-06 ENCOUNTER — Encounter: Payer: Self-pay | Admitting: Internal Medicine

## 2022-07-06 ENCOUNTER — Other Ambulatory Visit: Payer: Self-pay

## 2022-07-06 DIAGNOSIS — M48061 Spinal stenosis, lumbar region without neurogenic claudication: Secondary | ICD-10-CM

## 2022-07-06 MED ORDER — HYDROCODONE-ACETAMINOPHEN 5-325 MG PO TABS: 1.0000 | ORAL_TABLET | Freq: Two times a day (BID) | ORAL | 0 refills | Status: DC | PRN

## 2022-07-09 ENCOUNTER — Encounter: Payer: Self-pay | Admitting: Internal Medicine

## 2022-07-09 ENCOUNTER — Other Ambulatory Visit (INDEPENDENT_AMBULATORY_CARE_PROVIDER_SITE_OTHER): Payer: PPO

## 2022-07-09 DIAGNOSIS — R300 Dysuria: Secondary | ICD-10-CM

## 2022-07-10 LAB — URINALYSIS, ROUTINE W REFLEX MICROSCOPIC
Bilirubin Urine: NEGATIVE
Ketones, ur: NEGATIVE
Nitrite: POSITIVE — AB
Specific Gravity, Urine: 1.025 (ref 1.000–1.030)
Total Protein, Urine: 30 — AB
Urine Glucose: NEGATIVE
Urobilinogen, UA: 0.2 (ref 0.0–1.0)
pH: 6 (ref 5.0–8.0)

## 2022-07-12 ENCOUNTER — Other Ambulatory Visit: Payer: Self-pay | Admitting: Internal Medicine

## 2022-07-12 DIAGNOSIS — N390 Urinary tract infection, site not specified: Secondary | ICD-10-CM | POA: Insufficient documentation

## 2022-07-12 DIAGNOSIS — B952 Enterococcus as the cause of diseases classified elsewhere: Secondary | ICD-10-CM | POA: Insufficient documentation

## 2022-07-12 LAB — URINE CULTURE
MICRO NUMBER:: 14552121
SPECIMEN QUALITY:: ADEQUATE

## 2022-07-12 MED ORDER — CIPROFLOXACIN HCL 250 MG PO TABS: 250.0000 mg | ORAL_TABLET | Freq: Two times a day (BID) | ORAL | 0 refills | Status: AC

## 2022-07-12 NOTE — Assessment & Plan Note (Signed)
Her urinalysis and culture has pyuria and > 100K colonies of E Coli UTI sensitive to [email or web address].[email or web address] sent

## 2022-07-16 ENCOUNTER — Encounter (INDEPENDENT_AMBULATORY_CARE_PROVIDER_SITE_OTHER): Payer: Self-pay | Admitting: Nurse Practitioner

## 2022-07-17 ENCOUNTER — Encounter: Payer: Self-pay | Admitting: Vascular Surgery

## 2022-07-17 ENCOUNTER — Ambulatory Visit: Payer: PPO | Admitting: Certified Registered"

## 2022-07-17 ENCOUNTER — Encounter: Payer: Self-pay | Admitting: Internal Medicine

## 2022-07-17 ENCOUNTER — Other Ambulatory Visit: Payer: Self-pay

## 2022-07-17 ENCOUNTER — Encounter
Admission: AD | Disposition: A | Discharge status: Other Acute Inpatient Hospital | Payer: Self-pay | Source: Home / Self Care | Attending: Vascular Surgery

## 2022-07-17 ENCOUNTER — Inpatient Hospital Stay
Admission: AD | Admit: 2022-07-17 | Discharge: 2022-07-21 | DRG: 300 | Payer: PPO | Attending: Vascular Surgery | Admitting: Vascular Surgery

## 2022-07-17 DIAGNOSIS — I771 Stricture of artery: Principal | ICD-10-CM | POA: Diagnosis present

## 2022-07-17 DIAGNOSIS — K551 Chronic vascular disorders of intestine: Secondary | ICD-10-CM | POA: Diagnosis present

## 2022-07-17 DIAGNOSIS — Z90710 Acquired absence of both cervix and uterus: Secondary | ICD-10-CM

## 2022-07-17 DIAGNOSIS — I700 Atherosclerosis of aorta: Secondary | ICD-10-CM | POA: Diagnosis not present

## 2022-07-17 DIAGNOSIS — Z87440 Personal history of urinary (tract) infections: Secondary | ICD-10-CM | POA: Diagnosis not present

## 2022-07-17 DIAGNOSIS — Z91010 Allergy to peanuts: Secondary | ICD-10-CM

## 2022-07-17 DIAGNOSIS — Z83438 Family history of other disorder of lipoprotein metabolism and other lipidemia: Secondary | ICD-10-CM | POA: Diagnosis not present

## 2022-07-17 DIAGNOSIS — Z89019 Acquired absence of unspecified thumb: Secondary | ICD-10-CM | POA: Diagnosis not present

## 2022-07-17 DIAGNOSIS — I708 Atherosclerosis of other arteries: Secondary | ICD-10-CM | POA: Diagnosis not present

## 2022-07-17 DIAGNOSIS — I447 Left bundle-branch block, unspecified: Secondary | ICD-10-CM | POA: Diagnosis present

## 2022-07-17 DIAGNOSIS — D560 Alpha thalassemia: Secondary | ICD-10-CM | POA: Diagnosis present

## 2022-07-17 DIAGNOSIS — N183 Chronic kidney disease, stage 3 unspecified: Secondary | ICD-10-CM | POA: Diagnosis not present

## 2022-07-17 DIAGNOSIS — Z887 Allergy status to serum and vaccine status: Secondary | ICD-10-CM

## 2022-07-17 DIAGNOSIS — K5730 Diverticulosis of large intestine without perforation or abscess without bleeding: Secondary | ICD-10-CM | POA: Diagnosis not present

## 2022-07-17 DIAGNOSIS — F17210 Nicotine dependence, cigarettes, uncomplicated: Secondary | ICD-10-CM | POA: Diagnosis present

## 2022-07-17 DIAGNOSIS — Z8249 Family history of ischemic heart disease and other diseases of the circulatory system: Secondary | ICD-10-CM

## 2022-07-17 DIAGNOSIS — Z888 Allergy status to other drugs, medicaments and biological substances status: Secondary | ICD-10-CM | POA: Diagnosis not present

## 2022-07-17 DIAGNOSIS — I728 Aneurysm of other specified arteries: Secondary | ICD-10-CM | POA: Diagnosis not present

## 2022-07-17 DIAGNOSIS — I2510 Atherosclerotic heart disease of native coronary artery without angina pectoris: Secondary | ICD-10-CM | POA: Diagnosis not present

## 2022-07-17 DIAGNOSIS — Z79899 Other long term (current) drug therapy: Secondary | ICD-10-CM

## 2022-07-17 DIAGNOSIS — M1990 Unspecified osteoarthritis, unspecified site: Secondary | ICD-10-CM | POA: Diagnosis not present

## 2022-07-17 DIAGNOSIS — Z8261 Family history of arthritis: Secondary | ICD-10-CM | POA: Diagnosis not present

## 2022-07-17 DIAGNOSIS — Z882 Allergy status to sulfonamides status: Secondary | ICD-10-CM

## 2022-07-17 DIAGNOSIS — R109 Unspecified abdominal pain: Secondary | ICD-10-CM | POA: Diagnosis not present

## 2022-07-17 DIAGNOSIS — I739 Peripheral vascular disease, unspecified: Secondary | ICD-10-CM | POA: Diagnosis present

## 2022-07-17 DIAGNOSIS — Z880 Allergy status to penicillin: Secondary | ICD-10-CM | POA: Diagnosis not present

## 2022-07-17 DIAGNOSIS — I774 Celiac artery compression syndrome: Secondary | ICD-10-CM | POA: Diagnosis not present

## 2022-07-17 DIAGNOSIS — E782 Mixed hyperlipidemia: Secondary | ICD-10-CM | POA: Diagnosis not present

## 2022-07-17 DIAGNOSIS — K55069 Acute infarction of intestine, part and extent unspecified: Secondary | ICD-10-CM | POA: Diagnosis not present

## 2022-07-17 HISTORY — PX: VISCERAL ANGIOGRAPHY: CATH118276

## 2022-07-17 LAB — CBC
HCT: 31.1 % — ABNORMAL LOW (ref 36.0–46.0)
Hemoglobin: 9.5 g/dL — ABNORMAL LOW (ref 12.0–15.0)
MCH: 20.9 pg — ABNORMAL LOW (ref 26.0–34.0)
MCHC: 30.5 g/dL (ref 30.0–36.0)
MCV: 68.4 fL — ABNORMAL LOW (ref 80.0–100.0)
Platelets: 232 10*3/uL (ref 150–400)
RBC: 4.55 MIL/uL (ref 3.87–5.11)
RDW: 17.5 % — ABNORMAL HIGH (ref 11.5–15.5)
WBC: 10.9 10*3/uL — ABNORMAL HIGH (ref 4.0–10.5)
nRBC: 0.2 % (ref 0.0–0.2)

## 2022-07-17 LAB — CREATININE, SERUM
Creatinine, Ser: 0.92 mg/dL (ref 0.44–1.00)
GFR, Estimated: >60 mL/min (ref >60)

## 2022-07-17 LAB — BUN: BUN: 17 mg/dL (ref 8–23)

## 2022-07-17 LAB — APTT: aPTT: 39 seconds — ABNORMAL HIGH (ref 24–36)

## 2022-07-17 LAB — PROTIME-INR
INR: 1.1 (ref 0.8–1.2)
Prothrombin Time: 14.1 seconds (ref 11.4–15.2)

## 2022-07-17 SURGERY — VISCERAL ANGIOGRAPHY
Anesthesia: General

## 2022-07-17 MED ORDER — HEPARIN BOLUS VIA INFUSION
3900.0000 [IU] | Freq: Once | INTRAVENOUS | Status: DC
  Filled 2022-07-17: qty 3900

## 2022-07-17 MED ORDER — ASPIRIN 81 MG PO TBEC: 81.0000 mg | DELAYED_RELEASE_TABLET | Freq: Every day | ORAL | Status: DC

## 2022-07-17 MED ORDER — PROPOFOL 10 MG/ML IV BOLUS
INTRAVENOUS | Status: DC | PRN
  Administered 2022-07-17: 100 mg via INTRAVENOUS

## 2022-07-17 MED ORDER — NITROGLYCERIN 1 MG/10 ML FOR IR/CATH LAB
INTRA_ARTERIAL | Status: AC
  Filled 2022-07-17: qty 10

## 2022-07-17 MED ORDER — MIDAZOLAM HCL 2 MG/2ML IJ SOLN
INTRAMUSCULAR | Status: DC | PRN
  Administered 2022-07-17: 2 mg via INTRAVENOUS

## 2022-07-17 MED ORDER — METHYLPREDNISOLONE SODIUM SUCC 125 MG IJ SOLR: 125.0000 mg | Freq: Once | INTRAMUSCULAR | Status: DC | PRN

## 2022-07-17 MED ORDER — OXYCODONE HCL 5 MG PO TABS
5.0000 mg | ORAL_TABLET | ORAL | Status: DC | PRN
  Administered 2022-07-17 – 2022-07-19 (×3): 10 mg via ORAL
  Filled 2022-07-17 (×3): qty 2

## 2022-07-17 MED ORDER — PROGESTERONE MICRONIZED 100 MG PO CAPS
200.0000 mg | ORAL_CAPSULE | Freq: Every day | ORAL | Status: DC
  Administered 2022-07-17 – 2022-07-20 (×4): 200 mg via ORAL
  Filled 2022-07-17 (×4): qty 2

## 2022-07-17 MED ORDER — OXYCODONE HCL 5 MG PO TABS: 5.0000 mg | ORAL_TABLET | Freq: Once | ORAL | Status: DC | PRN

## 2022-07-17 MED ORDER — ONDANSETRON HCL 4 MG/2ML IJ SOLN: 4.0000 mg | Freq: Once | INTRAMUSCULAR | Status: DC | PRN

## 2022-07-17 MED ORDER — DIPHENHYDRAMINE HCL 50 MG/ML IJ SOLN: 50.0000 mg | Freq: Once | INTRAMUSCULAR | Status: DC | PRN

## 2022-07-17 MED ORDER — ACETAMINOPHEN 325 MG PO TABS
650.0000 mg | ORAL_TABLET | ORAL | Status: DC | PRN
  Administered 2022-07-18: 650 mg via ORAL
  Filled 2022-07-17: qty 2

## 2022-07-17 MED ORDER — MIDAZOLAM HCL 2 MG/ML PO SYRP: 8.0000 mg | ORAL_SOLUTION | Freq: Once | ORAL | Status: DC | PRN

## 2022-07-17 MED ORDER — FAMOTIDINE 20 MG PO TABS: 40.0000 mg | ORAL_TABLET | Freq: Once | ORAL | Status: DC | PRN

## 2022-07-17 MED ORDER — ALBUTEROL SULFATE (2.5 MG/3ML) 0.083% IN NEBU: 3.0000 mL | INHALATION_SOLUTION | Freq: Four times a day (QID) | RESPIRATORY_TRACT | Status: DC | PRN

## 2022-07-17 MED ORDER — HEPARIN SODIUM (PORCINE) 1000 UNIT/ML IJ SOLN
INTRAMUSCULAR | Status: DC | PRN
  Administered 2022-07-17: 6000 [IU] via INTRAVENOUS

## 2022-07-17 MED ORDER — PANTOPRAZOLE SODIUM 40 MG PO TBEC
40.0000 mg | DELAYED_RELEASE_TABLET | Freq: Two times a day (BID) | ORAL | Status: DC
  Administered 2022-07-17 – 2022-07-20 (×7): 40 mg via ORAL
  Filled 2022-07-17 (×7): qty 1

## 2022-07-17 MED ORDER — SODIUM CHLORIDE 0.9 % IV SOLN: 250.0000 mL | INTRAVENOUS | Status: DC | PRN

## 2022-07-17 MED ORDER — FENTANYL CITRATE (PF) 100 MCG/2ML IJ SOLN
INTRAMUSCULAR | Status: DC | PRN
  Administered 2022-07-17 (×4): 25 ug via INTRAVENOUS

## 2022-07-17 MED ORDER — ACETAMINOPHEN 10 MG/ML IV SOLN: 1000.0000 mg | Freq: Once | INTRAVENOUS | Status: DC | PRN

## 2022-07-17 MED ORDER — GABAPENTIN 300 MG PO CAPS
300.0000 mg | ORAL_CAPSULE | Freq: Three times a day (TID) | ORAL | Status: DC
  Administered 2022-07-17 – 2022-07-20 (×10): 300 mg via ORAL
  Filled 2022-07-17 (×10): qty 1

## 2022-07-17 MED ORDER — MORPHINE SULFATE (PF) 4 MG/ML IV SOLN: 2.0000 mg | INTRAVENOUS | Status: DC | PRN

## 2022-07-17 MED ORDER — SODIUM CHLORIDE 0.9 % IV SOLN: INTRAVENOUS | Status: DC

## 2022-07-17 MED ORDER — PHENYLEPHRINE HCL-NACL 20-0.9 MG/250ML-% IV SOLN
INTRAVENOUS | Status: AC
  Filled 2022-07-17: qty 250

## 2022-07-17 MED ORDER — HEPARIN (PORCINE) 25000 UT/250ML-% IV SOLN
1250.0000 [IU]/h | INTRAVENOUS | Status: DC
  Administered 2022-07-17: 900 [IU]/h via INTRAVENOUS
  Administered 2022-07-18 – 2022-07-20 (×3): 1250 [IU]/h via INTRAVENOUS
  Filled 2022-07-17 (×4): qty 250

## 2022-07-17 MED ORDER — PROPOFOL 10 MG/ML IV BOLUS
INTRAVENOUS | Status: AC
  Filled 2022-07-17: qty 20

## 2022-07-17 MED ORDER — ROSUVASTATIN CALCIUM 10 MG PO TABS
10.0000 mg | ORAL_TABLET | Freq: Every day | ORAL | Status: DC
  Administered 2022-07-17 – 2022-07-20 (×4): 10 mg via ORAL
  Filled 2022-07-17 (×4): qty 1

## 2022-07-17 MED ORDER — FENTANYL CITRATE (PF) 100 MCG/2ML IJ SOLN: 25.0000 ug | INTRAMUSCULAR | Status: DC | PRN

## 2022-07-17 MED ORDER — LACTATED RINGERS IV SOLN: INTRAVENOUS | Status: DC

## 2022-07-17 MED ORDER — SODIUM CHLORIDE 0.9% FLUSH: 3.0000 mL | INTRAVENOUS | Status: DC | PRN

## 2022-07-17 MED ORDER — ROPINIROLE HCL 1 MG PO TABS
1.0000 mg | ORAL_TABLET | Freq: Three times a day (TID) | ORAL | Status: DC
  Administered 2022-07-17 – 2022-07-20 (×10): 1 mg via ORAL
  Filled 2022-07-17 (×10): qty 1

## 2022-07-17 MED ORDER — HEPARIN BOLUS VIA INFUSION
2000.0000 [IU] | Freq: Once | INTRAVENOUS | Status: AC
  Administered 2022-07-17: 2000 [IU] via INTRAVENOUS
  Filled 2022-07-17: qty 2000

## 2022-07-17 MED ORDER — SODIUM CHLORIDE 0.9% FLUSH
3.0000 mL | Freq: Two times a day (BID) | INTRAVENOUS | Status: DC
  Administered 2022-07-17 – 2022-07-20 (×5): 3 mL via INTRAVENOUS

## 2022-07-17 MED ORDER — NITROGLYCERIN 0.4 MG SL SUBL: 0.4000 mg | SUBLINGUAL_TABLET | SUBLINGUAL | Status: DC | PRN

## 2022-07-17 MED ORDER — CEFAZOLIN SODIUM-DEXTROSE 1-4 GM/50ML-% IV SOLN
INTRAVENOUS | Status: AC
  Filled 2022-07-17: qty 50

## 2022-07-17 MED ORDER — ONDANSETRON HCL 4 MG/2ML IJ SOLN
INTRAMUSCULAR | Status: DC | PRN
  Administered 2022-07-17: 4 mg via INTRAVENOUS

## 2022-07-17 MED ORDER — MIDAZOLAM HCL 2 MG/2ML IJ SOLN
INTRAMUSCULAR | Status: AC
  Filled 2022-07-17: qty 2

## 2022-07-17 MED ORDER — HYDROMORPHONE HCL 1 MG/ML IJ SOLN: 1.0000 mg | Freq: Once | INTRAMUSCULAR | Status: DC | PRN

## 2022-07-17 MED ORDER — ONDANSETRON HCL 4 MG/2ML IJ SOLN: 4.0000 mg | Freq: Four times a day (QID) | INTRAMUSCULAR | Status: DC | PRN

## 2022-07-17 MED ORDER — OXYCODONE HCL 5 MG/5ML PO SOLN: 5.0000 mg | Freq: Once | ORAL | Status: DC | PRN

## 2022-07-17 MED ORDER — LIDOCAINE HCL (CARDIAC) PF 100 MG/5ML IV SOSY
PREFILLED_SYRINGE | INTRAVENOUS | Status: DC | PRN
  Administered 2022-07-17: 100 mg via INTRAVENOUS

## 2022-07-17 MED ORDER — BUPROPION HCL 75 MG PO TABS
75.0000 mg | ORAL_TABLET | Freq: Two times a day (BID) | ORAL | Status: DC
  Administered 2022-07-17 – 2022-07-20 (×7): 75 mg via ORAL
  Filled 2022-07-17 (×8): qty 1

## 2022-07-17 MED ORDER — NITROGLYCERIN IN D5W 200-5 MCG/ML-% IV SOLN
INTRAVENOUS | Status: DC | PRN
  Administered 2022-07-17: .3 mg via INTRAVENOUS

## 2022-07-17 MED ORDER — VANCOMYCIN HCL IN DEXTROSE 1-5 GM/200ML-% IV SOLN
INTRAVENOUS | Status: AC
  Filled 2022-07-17: qty 200

## 2022-07-17 MED ORDER — ONDANSETRON HCL 4 MG/2ML IJ SOLN
4.0000 mg | Freq: Four times a day (QID) | INTRAMUSCULAR | Status: DC | PRN
  Administered 2022-07-18 – 2022-07-19 (×2): 4 mg via INTRAVENOUS
  Filled 2022-07-17 (×2): qty 2

## 2022-07-17 MED ORDER — SODIUM CHLORIDE 0.9 % IV SOLN: INTRAVENOUS | Status: AC

## 2022-07-17 MED ORDER — METOPROLOL SUCCINATE ER 25 MG PO TB24
25.0000 mg | ORAL_TABLET | Freq: Every day | ORAL | Status: DC
  Administered 2022-07-17 – 2022-07-20 (×4): 25 mg via ORAL
  Filled 2022-07-17 (×5): qty 1

## 2022-07-17 MED ORDER — FENTANYL CITRATE (PF) 100 MCG/2ML IJ SOLN
INTRAMUSCULAR | Status: AC
  Filled 2022-07-17: qty 2

## 2022-07-17 MED ORDER — VANCOMYCIN HCL IN DEXTROSE 1-5 GM/200ML-% IV SOLN
1000.0000 mg | INTRAVENOUS | Status: AC
  Administered 2022-07-17: 1000 mg via INTRAVENOUS

## 2022-07-17 MED ORDER — DOCUSATE SODIUM 100 MG PO CAPS
100.0000 mg | ORAL_CAPSULE | Freq: Two times a day (BID) | ORAL | Status: DC
  Administered 2022-07-17 – 2022-07-20 (×7): 100 mg via ORAL
  Filled 2022-07-17 (×7): qty 1

## 2022-07-17 SURGICAL SUPPLY — 19 items
CATH ANGIO 5F PIGTAIL 100CM (CATHETERS) IMPLANT
CATH BEACON 5 .035 100 C2 TIP (CATHETERS) IMPLANT
CATH BEACON 5 .038 100 VERT TP (CATHETERS) IMPLANT
CATH INFINITI 5F MPA2 125 (CATHETERS) IMPLANT
DEVICE TORQUE .025-.038 (MISCELLANEOUS) IMPLANT
GLIDEWIRE ANGLED SS 035X260CM (WIRE) IMPLANT
GUIDEWIRE PFTE-COATED .018X300 (WIRE) IMPLANT
KIT ENCORE 26 ADVANTAGE (KITS) IMPLANT
NDL ENTRY 21GA 7CM ECHOTIP (NEEDLE) IMPLANT
NEEDLE ENTRY 21GA 7CM ECHOTIP (NEEDLE) ×1 IMPLANT
PACK ANGIOGRAPHY (CUSTOM PROCEDURE TRAY) ×1 IMPLANT
SET INTRO CAPELLA COAXIAL (SET/KITS/TRAYS/PACK) IMPLANT
SHEATH HALO 6F 70 (SHEATH) IMPLANT
SHEATH PINNACLE R/O II 5F 6CM (SHEATH) IMPLANT
SPONGE XRAY 4X4 16PLY STRL (MISCELLANEOUS) IMPLANT
SYR MEDRAD MARK 7 150ML (SYRINGE) IMPLANT
TUBING CONTRAST HIGH PRESS 72 (TUBING) IMPLANT
WIRE GUIDERIGHT .035X150 (WIRE) IMPLANT
WIRE SUPRACORE 300CM (WIRE) IMPLANT

## 2022-07-17 NOTE — Anesthesia Procedure Notes (Signed)
Procedure Name: LMA Insertion Date/Time: 07/17/2022 2:06 PM  Performed by: Lily Peer, Burns Timson, CRNAPre-anesthesia Checklist: Patient identified, Emergency Drugs available, Suction available and Patient being monitored Patient Re-evaluated:Patient Re-evaluated prior to induction Oxygen Delivery Method: Circle system utilized Preoxygenation: Pre-oxygenation with 100% oxygen Induction Type: IV induction Ventilation: Mask ventilation without difficulty LMA: LMA inserted LMA Size: 3.0 Number of attempts: 1 Placement Confirmation: positive ETCO2 and breath sounds checked- equal and bilateral Tube secured with: Tape Dental Injury: Teeth and Oropharynx as per pre-operative assessment

## 2022-07-17 NOTE — Anesthesia Preprocedure Evaluation (Addendum)
Anesthesia Evaluation  Patient identified by MRN, date of birth, ID band Patient awake    Reviewed: Allergy & Precautions, H&P , NPO status , Patient's Chart, lab work & pertinent test results  Airway Mallampati: III  TM Distance: >3 FB Neck ROM: full    Dental no notable dental hx.    Pulmonary sleep apnea , COPD, Current Smoker and Patient abstained from smoking.   Pulmonary exam normal        Cardiovascular + Peripheral Vascular Disease (hx splenic artery aneurysm; celiac stenosis; bilateral carotid stenosis)  Normal cardiovascular exam+ dysrhythmias (LBBB)   ECG 03/30/22: NSR, LBBB  Myocardial perfusion 01/30/19:  Pharmacological myocardial perfusion imaging study with no significant ischemia Fixed defect in the anteroseptal, apical region, likely attenuation artifact/bundle branch block, though unable to exclude prior MI Anteroseptal wall hypokinesis, secondary to bundle branch block,  EF estimated at 40% No EKG changes concerning for ischemia at peak stress or in recovery. Baseline EKG with LBBB Low risk scan Study compared to prior study dated 10/2010, no significant change noted, prior defect detailed above secondary to LBBB was previously noted.   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 Celiac artery stenosis (HCC) Recent angiogram was unsuccessful.  We will reattempt with a different approach coming from the brachial artery as  well as a femoral approach in order to treat the celiac artery lesion.  We have discussed the risk, benefits and alternatives and the patient is agreeable to move forward to proceed.   2. Mixed hyperlipidemia Continue statin as ordered and reviewed, no changes at this time   3. Bilateral carotid artery stenosis Patient recently had noninvasive studies in July.  Minimal stenosis noted bilaterally.  We will continue to follow this annually.  Patient to continue with statin and antiplatelet   Reproductive/Obstetrics negative OB ROS                             Anesthesia Physical Anesthesia Plan  ASA: 3  Anesthesia Plan: General   Post-op Pain Management: Minimal or no pain anticipated   Induction: Intravenous  PONV Risk Score and Plan: 3 and Dexamethasone, Ondansetron, Midazolam and Treatment may vary due to age or medical condition  Airway Management Planned: LMA  Additional Equipment:   Intra-op Plan:   Post-operative Plan: Extubation in OR  Informed Consent: I have reviewed the patients History and Physical, chart, labs and discussed the procedure including the risks, benefits and alternatives for the proposed anesthesia with the patient or authorized representative who has indicated his/her understanding and acceptance.     Dental Advisory Given  Plan Discussed with: Anesthesiologist, CRNA and Surgeon  Anesthesia Plan Comments:         Anesthesia Quick Evaluation

## 2022-07-17 NOTE — Anesthesia Postprocedure Evaluation (Signed)
Anesthesia Post Note  Patient: Cheryl [name]  Procedure(s) Performed: VISCERAL ANGIOGRAPHY  Patient location during evaluation: Spec[facility] Recovery Anesthesia Type: General Level of consciousness: awake and alert Pain management: pain level controlled Vital Signs Assessment: post-procedure vital signs reviewed and stable Respiratory status: spontaneous breathing, nonlabored ventilation, respiratory function stable and patient connected to nasal cannula oxygen Cardiovascular status: blood pressure returned to baseline and stable Postop Assessment: no apparent nausea or vomiting Anesthetic complications: no  There were no known notable events for this encounter.   Last Vitals:  Vitals:   07/17/22 1806 07/17/22 1917  BP: (!) 153/77 (!) 146/58  Pulse: 63 70  Resp: 18 20  Temp: 36.5 C 37 C  SpO2: 99% 100%    Last Pain:  Vitals:   07/17/22 1917  TempSrc: Oral  PainSc:                  Dimas Millin

## 2022-07-17 NOTE — Op Note (Signed)
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  Abdominal aortogram  2.  Selective injection of the celiac artery first-order catheter placement  3.  Left brachial artery cutdown for arterial access    Anesthesia: General by LMA  Sheath: 6 French 70 cm halo retrograde left brachial artery  Contrast: 60 cc   Fluoroscopy Time: 13.4 minutes  Indications: Patient is returning to special procedures for treatment of a celiac artery stenosis.  This was identified by angiography approximately 2 weeks ago.  That procedure was performed from a right femoral approach.  Although imaging of the ostia of the celiac artery was obtained confirming the ultrasound diagnosis of greater than 70% stenosis of the celiac artery I was not able to engage the celiac and obtain third order catheter placement for more complete imaging.  It was for this reason that we terminated the case and rescheduled it for today with the arm approach.  Risks and benefits of been reviewed all questions answered patient agrees to proceed  Procedure:  Azarria Yturralde is a 67 y.o. female who was identified and appropriate procedural time out was performed.  The patient was then placed supine on the table and prepped and draped in the usual sterile fashion with her left arm extended palm upward.  After appropriate timeout is called local is infiltrated into the soft tissues overlying the brachial impulse at the level of the antecubital fossa.  Dissection carried down to expose the brachial artery which is looped proximally and distally.  Microneedle is then inserted under direct visualization followed  by microwire micro sheath.  A 5 French Pinnacle sheath is then inserted without difficulty.  A J-wire is then negotiated into the descending thoracic aorta and a pigtail catheter was advanced and positioned in the mid descending thoracic aorta.  We transitioned to a lateral projection.  Bolus injection contrast then used and demonstrates the ostial stenosis of the celiac.  Using a supra core wire is then reinserted under fluoroscopic guidance and the pigtail catheter and 5 French sheath were removed a 6 French 70 cm halo sheath was then inserted and positioned again with its tip at the top of the screen in the mid descending thoracic aorta.  A multipurpose catheter was then used with a supra core wire and the celiac is easily engaged.  With the catheter seated in the celiac I am now able to get much better imaging.  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  Imaging is obtained in both lateral and AP.  At this point I spent the rest of the case using multiple different catheters and several different wires attempting to select the hepatic.  Unfortunately was never able to advance a wire out of the aneurysm and to successfully select the splenic or the common hepatic.  At this point not being able to obtain distal purchase I elected to terminate the case.  Wire and sheath were removed.  The brachial artery was then controlled with Silastic Vesseloops and 3 interrupted 6-0 Prolene sutures were used to repair the brachial artery.  Distal radial pulse was easily palpable.  The wound was then irrigated with sterile saline and closed with 3-0 Vicryl followed  by 4-0 Monocryl subcuticular and Dermabond.    Disposition: Patient was taken to the recovery room in stable condition having tolerated the procedure well.  Belenda Cruise Oralee Rapaport 07/17/2022,5:52 PM

## 2022-07-17 NOTE — Consult Note (Signed)
ANTICOAGULATION CONSULT NOTE - Initial Consult  Pharmacy Consult for heparin Indication:  ASO visceral arteries  Allergies  Allergen Reactions   Peanuts [Peanut Oil]    Penicillins Other (See Comments)    Hives,rash,nausea,swelling,    Sulfa Antibiotics Other (See Comments)    Hives,rash,nausea,swelling   Corticosteroids     Other Reaction(s): RASH, SWELLING   Influenza Vac Split [Influenza Virus Vaccine]     Pleurisy  1996   Mobic [Meloxicam] Nausea Only    Renal insufficiency   Nickel    Nsaids Other (See Comments)    Decreased gfr    Penicillin G     Other Reaction(s): RASH , SWELLING   Prednisone Rash    Patient Measurements: Height: 5' 3"$  (160 cm) Weight: 68.5 kg (151 lb 0.2 oz) IBW/kg (Calculated) : 52.4 Heparin Dosing Weight: 66.4  Vital Signs: Temp: 97.7 F (36.5 C) (02/20 1806) Temp Source: Oral (02/20 1806) BP: 153/77 (02/20 1806) Pulse Rate: 63 (02/20 1806)  Labs: Recent Labs    07/17/22 1330  CREATININE 0.92    Estimated Creatinine Clearance: 55.1 mL/min (by C-G formula based on SCr of 0.92 mg/dL).   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 She underwent abdominal aortogram today for treatment and pharmacy has now been consulted to initiate heparin. Patient was in stable condition after the  procedure, having tolerated it well. She does not take anticoagulation PTA.  Date Time aPTT/HL Rate/Comment       Baseline Labs: aPTT - ordered; INR - ordered Hgb - ordered; Plts - ordered  Goal of Therapy:  Heparin level 0.3-0.7 units/ml Monitor platelets by anticoagulation protocol: Yes   Plan: Give 2000 units bolus x1; then start heparin infusion at 900 units/hr Check anti-Xa level in 6 hours and daily once consecutively therapeutic. Continue to monitor H&H and platelets daily while on heparin gtt.  Will M. Ouida Sills, PharmD PGY-1 Pharmacy Resident 07/17/2022 6:55 PM

## 2022-07-17 NOTE — Interval H&P Note (Signed)
History and Physical Interval Note:  07/17/2022 1:36 PM  Cheryl [name]  has presented today for surgery, with the diagnosis of Mesenteric Angio w arm approach and brachial cut down   ANESTHESIA Celiac artery stenosis.  The various methods of treatment have been discussed with the patient and family. After consideration of risks, benefits and other options for treatment, the patient has consented to  Procedure(s): VISCERAL ANGIOGRAPHY (N/A) as a surgical intervention.  The patient's history has been reviewed, patient examined, no change in status, stable for surgery.  I have reviewed the patient's chart and labs.  Questions were answered to the patient's satisfaction.     [name]

## 2022-07-17 NOTE — Transfer of Care (Signed)
Immediate Anesthesia Transfer of Care Note  Patient: Cheryl [name]  Procedure(s) Performed: VISCERAL ANGIOGRAPHY  Patient Location: PACU  Anesthesia Type:General  Level of Consciousness: awake, alert , and oriented  Airway & Oxygen Therapy: Patient Spontanous Breathing  Post-op Assessment: Report given to RN and Post -op Vital signs reviewed and stable  Post vital signs: Reviewed and stable  Last Vitals:  Vitals Value Taken Time  BP 146/73   Temp    Pulse 66 07/17/22 1614  Resp 13 07/17/22 1614  SpO2 98 % 07/17/22 1614  Vitals shown include unvalidated device data.  Last Pain:  Vitals:   07/17/22 1337  PainSc: 0-No pain         Complications: There were no known notable events for this encounter.

## 2022-07-18 ENCOUNTER — Ambulatory Visit: Payer: PPO | Admitting: Internal Medicine

## 2022-07-18 ENCOUNTER — Encounter: Payer: Self-pay | Admitting: Vascular Surgery

## 2022-07-18 ENCOUNTER — Inpatient Hospital Stay: Payer: PPO

## 2022-07-18 LAB — BASIC METABOLIC PANEL
Anion gap: 6 (ref 5–15)
BUN: 14 mg/dL (ref 8–23)
CO2: 25 mmol/L (ref 22–32)
Calcium: 8.3 mg/dL — ABNORMAL LOW (ref 8.9–10.3)
Chloride: 106 mmol/L (ref 98–111)
Creatinine, Ser: 0.88 mg/dL (ref 0.44–1.00)
GFR, Estimated: >60 mL/min (ref >60)
Glucose, Bld: 100 mg/dL — ABNORMAL HIGH (ref 70–99)
Potassium: 3.8 mmol/L (ref 3.5–5.1)
Sodium: 137 mmol/L (ref 135–145)

## 2022-07-18 LAB — CBC
HCT: 29.7 % — ABNORMAL LOW (ref 36.0–46.0)
Hemoglobin: 9.2 g/dL — ABNORMAL LOW (ref 12.0–15.0)
MCH: 21.1 pg — ABNORMAL LOW (ref 26.0–34.0)
MCHC: 31 g/dL (ref 30.0–36.0)
MCV: 68.3 fL — ABNORMAL LOW (ref 80.0–100.0)
Platelets: 217 10*3/uL (ref 150–400)
RBC: 4.35 MIL/uL (ref 3.87–5.11)
RDW: 17.4 % — ABNORMAL HIGH (ref 11.5–15.5)
WBC: 12.3 10*3/uL — ABNORMAL HIGH (ref 4.0–10.5)
nRBC: 0.2 % (ref 0.0–0.2)

## 2022-07-18 LAB — HEPARIN LEVEL (UNFRACTIONATED)
Heparin Unfractionated: 0.16 IU/mL — ABNORMAL LOW (ref 0.30–0.70)
Heparin Unfractionated: 0.24 IU/mL — ABNORMAL LOW (ref 0.30–0.70)
Heparin Unfractionated: 0.41 IU/mL (ref 0.30–0.70)

## 2022-07-18 MED ORDER — IOHEXOL 350 MG/ML SOLN
100.0000 mL | Freq: Once | INTRAVENOUS | Status: AC | PRN
  Administered 2022-07-18: 100 mL via INTRAVENOUS

## 2022-07-18 MED ORDER — HEPARIN BOLUS VIA INFUSION
1000.0000 [IU] | Freq: Once | INTRAVENOUS | Status: AC
  Administered 2022-07-18: 1000 [IU] via INTRAVENOUS
  Filled 2022-07-18: qty 1000

## 2022-07-18 MED ORDER — HEPARIN BOLUS VIA INFUSION
2000.0000 [IU] | Freq: Once | INTRAVENOUS | Status: AC
  Administered 2022-07-18: 2000 [IU] via INTRAVENOUS
  Filled 2022-07-18: qty 2000

## 2022-07-18 NOTE — Consult Note (Signed)
ANTICOAGULATION CONSULT NOTE  Pharmacy Consult for heparin Indication:  ASO visceral arteries  Allergies  Allergen Reactions   Peanuts [Peanut Oil]    Penicillins Other (See Comments)    Hives,rash,nausea,swelling,    Sulfa Antibiotics Other (See Comments)    Hives,rash,nausea,swelling   Corticosteroids     Other Reaction(s): RASH, SWELLING   Influenza Vac Split [Influenza Virus Vaccine]     Pleurisy  1996   Mobic [Meloxicam] Nausea Only    Renal insufficiency   Nickel    Nsaids Other (See Comments)    Decreased gfr    Penicillin G     Other Reaction(s): RASH , SWELLING   Prednisone Rash    Patient Measurements: Height: 5' 3"$  (160 cm) Weight: 68.5 kg (151 lb 0.2 oz) IBW/kg (Calculated) : 52.4 Heparin Dosing Weight: 66.4  Vital Signs: Temp: 99.7 F (37.6 C) (02/21 2125) Temp Source: Oral (02/21 2125) BP: 136/68 (02/21 2125) Pulse Rate: 86 (02/21 2125)  Labs: Recent Labs    07/17/22 1330 07/17/22 1906 07/18/22 0215 07/18/22 1413 07/18/22 2139  HGB  --  9.5* 9.2*  --   --   HCT  --  31.1* 29.7*  --   --   PLT  --  232 217  --   --   APTT  --  39*  --   --   --   LABPROT  --  14.1  --   --   --   INR  --  1.1  --   --   --   HEPARINUNFRC  --   --  0.16* 0.24* 0.41  CREATININE 0.92  --  0.88  --   --      Estimated Creatinine Clearance: 57.6 mL/min (by C-G formula based on SCr of 0.88 mg/dL).   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 She underwent abdominal aortogram today for treatment and pharmacy has now been consulted to initiate heparin. Patient was in stable condition after the procedure, having tolerated it well. She does not take anticoagulation PTA.  Date Time aPTT/HL Rate/Comment       Baseline Labs: aPTT 39 sec, INR 1.1, hgb 9.5, plt 232  Goal of Therapy:  Heparin level 0.3-0.7 units/ml Monitor platelets by anticoagulation protocol: Yes  2/21 0215 HL 0.16, subtherapeutic 2/21 1413 HL 0.24, subtherapeutic 2/21 2139 HL 0.41, therapeutic x 1  Plan: Continue heparin drip rate at 1250 unit/hr Recheck HL w/ AM labs to confrim Continue to monitor H&H and platelets daily while on heparin gtt.  Renda Rolls, PharmD, [facility] 07/18/2022 11:08 PM

## 2022-07-18 NOTE — Progress Notes (Signed)
Mobility Specialist - Progress Note   07/18/22 1555  Mobility  Activity Ambulated independently in room  Level of Assistance Independent  Assistive Device None;Other (Comment) (IV pole)  Distance Ambulated (ft) 12 ft  Activity Response Tolerated well  $Mobility charge 1 Mobility   Pt amb indep in room no AD, utilizing RA. Pt completed ADL's, returned to bed left supine with needs within reach.   Candie Mile Mobility Specialist 07/18/22 4:02 PM

## 2022-07-18 NOTE — Progress Notes (Signed)
[name]  Daily Progress Note   Subjective  - 1 Day Post-Op  Patient notes she has been having significant vomiting with eating today.  CT is reviewed by me personally.  The greater than 80% stenosis of the proximal celiac artery is again noted.  The area which appears aneurysmal on the angiogram is indeed dilated but our measurements by angiogram were 2.4 and the maximal diameter by CT scan is 1.6.  Hepatic flow was maintained.  Objective Vitals:   07/17/22 1806 07/17/22 1917 07/18/22 0351 07/18/22 0806  BP: (!) 153/77 (!) 146/58 133/66 (!) 145/75  Pulse: 63 70 79 70  Resp: 18 20 20 18  $ Temp: 97.7 F (36.5 C) 98.6 F (37 C) 98.6 F (37 C) 98 F (36.7 C)  TempSrc: Oral Oral Oral Oral  SpO2: 99% 100% 97% 99%  Weight:      Height:        Intake/Output Summary (Last 24 hours) at 07/18/2022 1833 Last data filed at 07/18/2022 1350 Gross per 24 hour  Intake 841.97 ml  Output --  Net 841.97 ml    PULM  Normal effort , no use of accessory muscles CV  No JVD, RRR Abd      No distended, nontender   Laboratory CBC    Component Value Date/Time   WBC 12.3 (H) 07/18/2022 0215   HGB 9.2 (L) 07/18/2022 0215   HGB 9.9 (L) 11/25/2019 1232   HCT 29.7 (L) 07/18/2022 0215   HCT 31.7 (L) 11/25/2019 1232   PLT 217 07/18/2022 0215   PLT 291 11/25/2019 1232    BMET    Component Value Date/Time   NA 137 07/18/2022 0215   NA 139 05/23/2020 1147   K 3.8 07/18/2022 0215   CL 106 07/18/2022 0215   CO2 25 07/18/2022 0215   GLUCOSE 100 (H) 07/18/2022 0215   BUN 14 07/18/2022 0215   BUN 11 05/23/2020 1147   CREATININE 0.88 07/18/2022 0215   CREATININE 0.95 09/01/2014 1659   CREATININE 0.80 08/15/2012 1609   CALCIUM 8.3 (L) 07/18/2022 0215   CALCIUM 9.7 09/01/2014 1659   GFRNONAA >60 07/18/2022 0215   GFRNONAA >60 09/01/2014 1659   GFRAA 67 05/23/2020 1147   GFRAA >60 09/01/2014 1659    Assessment/Planning: POD # 1 s/p angiography with attempted  treatment of her celiac artery disease  I discussed the findings with the patient although the aneurysm is significantly smaller I believe it still needs to be fixed given the association with the high-grade stenosis and her postprandial symptoms.  Although intervention may be possible I have not been successful at crossing the aneurysm and I believe that open surgical repair is now a much more likely scenario.  This is not a surgery that would be done here at [facility] and I have discussed with her transfer to [facility].  She has agreed to this and I will contact Dr. Adriana [name] regarding transfer.   Cheryl [name]  07/18/2022, 6:33 PM

## 2022-07-18 NOTE — Consult Note (Signed)
ANTICOAGULATION CONSULT NOTE - Initial Consult  Pharmacy Consult for heparin Indication:  ASO visceral arteries  Allergies  Allergen Reactions   Peanuts [Peanut Oil]    Penicillins Other (See Comments)    Hives,rash,nausea,swelling,    Sulfa Antibiotics Other (See Comments)    Hives,rash,nausea,swelling   Corticosteroids     Other Reaction(s): RASH, SWELLING   Influenza Vac Split [Influenza Virus Vaccine]     Pleurisy  1996   Mobic [Meloxicam] Nausea Only    Renal insufficiency   Nickel    Nsaids Other (See Comments)    Decreased gfr    Penicillin G     Other Reaction(s): RASH , SWELLING   Prednisone Rash    Patient Measurements: Height: 5' 3"$  (160 cm) Weight: 68.5 kg (151 lb 0.2 oz) IBW/kg (Calculated) : 52.4 Heparin Dosing Weight: 66.4  Vital Signs: Temp: 98.6 F (37 C) (02/21 0351) Temp Source: Oral (02/21 0351) BP: 133/66 (02/21 0351) Pulse Rate: 79 (02/21 0351)  Labs: Recent Labs    07/17/22 1330 07/17/22 1906 07/18/22 0215  HGB  --  9.5* 9.2*  HCT  --  31.1* 29.7*  PLT  --  232 217  APTT  --  39*  --   LABPROT  --  14.1  --   INR  --  1.1  --   HEPARINUNFRC  --   --  0.16*  CREATININE 0.92  --  0.88     Estimated Creatinine Clearance: 57.6 mL/min (by C-G formula based on SCr of 0.88 mg/dL).   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 She underwent abdominal aortogram today for treatment and pharmacy has now been consulted to initiate heparin. Patient was in stable condition after the procedure, having tolerated it well. She does not take anticoagulation PTA.  Date Time aPTT/HL Rate/Comment       Baseline Labs: aPTT 39 sec, INR 1.1, hgb 9.5, plt 232  Goal of Therapy:  Heparin level 0.3-0.7 units/ml Monitor platelets by anticoagulation protocol: Yes    2/21 0215 HL 0.16, subtherapeutic   Plan: Give heparin 2000 units IV x 1 Increase heparin drip rate to 1100 unit/hr Check anti-Xa level in 6 hours after rate change Continue to monitor H&H and platelets daily while on heparin gtt.  Tollie Eth III, PharmD 07/18/2022 7:03 AM

## 2022-07-18 NOTE — TOC Initial Note (Signed)
Transition of Care [facility]) - Initial/Assessment Note    Patient Details  Name: Cheryl [name] MRN: [id number] Date of Birth: [birth year]  Transition of Care [facility]) CM/SW Contact:    Beverly Sessions, RN Phone Number: 07/18/2022, 10:51 AM  Clinical Narrative:                  Transition of Care [facility]) Screening Note   Patient Details  Name: Cheryl [name] Date of Birth: [birth year]   Transition of Care [facility]) CM/SW Contact:    Beverly Sessions, RN Phone Number: 07/18/2022, 10:51 AM    Transition of Care Department [facility]) has reviewed patient and no TOC needs have been identified at this time. We will continue to monitor patient advancement through interdisciplinary progression rounds. If new patient transition needs arise, please place a TOC consult.          Patient Goals and CMS Choice            Expected Discharge Plan and Services                                              Prior Living Arrangements/Services                       Activities of Daily Living Home Assistive Devices/Equipment: Eyeglasses ADL Screening (condition at time of admission) Patient's cognitive ability adequate to safely complete daily activities?: Yes Is the patient deaf or have difficulty hearing?: Yes Does the patient have difficulty seeing, even when wearing glasses/contacts?: No Does the patient have difficulty concentrating, remembering, or making decisions?: No Patient able to express need for assistance with ADLs?: Yes Does the patient have difficulty dressing or bathing?: No Independently performs ADLs?: Yes (appropriate for developmental age) Does the patient have difficulty walking or climbing stairs?: No Weakness of Legs: None Weakness of Arms/Hands: None  Permission Sought/Granted                  Emotional Assessment              Admission diagnosis:  Aneurysm of celiac artery (HCC) [I72.8] Patient Active Problem List    Diagnosis Date Noted   Aneurysm of celiac artery ([place]) 07/17/2022   UTI (urinary tract infection) due to Enterococcus [id number]   Helicobacter pylori gastritis 05/03/2022   Acute gastritis without hemorrhage 05/03/2022   Benign positional vertigo 10/31/2021   Lateral epicondylitis, left elbow 10/17/2021   Emphysema of lung ([place]) 02/04/2021   Right shoulder pain 11/21/2020   Aortic atherosclerosis ([place]) 10/27/2020   Beta thalassemia ([place]) 05/24/2020   Proximal leg weakness 05/24/2020   Lumbar stenosis without neurogenic claudication 03/24/2020   Celiac artery stenosis ([place]) 12/10/2019   Hospital discharge follow-up 02/15/2019   Chest pain 01/29/2019   Restless legs 11/09/2018   Educated about COVID-19 virus infection 11/09/2018   Osteoarthritis of both shoulders 08/03/2018   Cervical radiculopathy 12/30/2017   Carotid artery stenosis 09/09/2017   Right lateral epicondylitis 08/13/2017   Nonallopathic lesion of cervical region 08/13/2017   Nonallopathic lesion of thoracic region 08/13/2017   Nonallopathic lesion of lumbosacral region 08/13/2017   Chronic renal insufficiency 04/03/2017   Renal hypertension 12/16/2016   Diverticulosis of colon 09/02/2016   PAD (peripheral artery disease) ([place]) 08/22/2016   Iron deficiency anemia 11/25/2015   Splenic artery aneurysm ([place]) 08/03/2015  Chronic neck pain 06/21/2015   Arthralgia 05/19/2015   Palpitations 03/28/2015   Pancreatic cyst 10/24/2014   Multiple lung nodules 10/24/2014   Hyperlipidemia 10/20/2014   Chronic lower back pain 09/18/2013   Screening for osteoporosis 08/20/2013   Routine general medical examination at a health care facility 10/26/2012   Anxiety 08/22/2012   Irritable bowel syndrome 08/17/2012   Tobacco abuse 08/17/2012   Hearing loss d/t noise 08/17/2012   Diverticulosis 08/17/2012   OSA (obstructive sleep apnea) 08/17/2012   Hematuria, microscopic 08/16/2012   Thalassemia minor 08/16/2012   PCP:  Crecencio Mc, MD Pharmacy:   [facility] [address] [place] [address] Phone: [phone] Fax: [phone]     Social Determinants of Health (SDOH) Social History: SDOH Screenings   Food Insecurity: No Food Insecurity (07/17/2022)  Housing: Low Risk  (07/17/2022)  Transportation Needs: No Transportation Needs (07/17/2022)  Utilities: Not At Risk (07/17/2022)  Depression (PHQ2-9): Low Risk  (05/02/2022)  Financial Resource Strain: Low Risk  (07/04/2021)  Physical Activity: Sufficiently Active (07/04/2021)  Social Connections: Unknown (07/04/2021)  Stress: No Stress Concern Present (07/04/2021)  Tobacco Use: High Risk (07/18/2022)   SDOH Interventions:     Readmission Risk Interventions     No data to display

## 2022-07-18 NOTE — Progress Notes (Signed)
Progress Note    07/18/2022 12:01 PM 1 Day Post-Op  Subjective:  Cheryl [name] is a 67 year old female who is now postop day 1 from a visceral angiogram for celiac artery stenosis.  Patient is resting comfortably in bed this morning.  She just returned from CT of her chest abdomen and pelvis.  Today she had a mesenteric angiogram with an arm approach and a brachial cutdown.  This was on her left upper extremity.  She denies any numbness or tingling in her left arm or hand.  She denies any weakness to her left arm as well.  She states that her arm is sore this morning.  Denies any abdominal pain this morning or nausea and vomiting.  Denies any diarrhea or loose stools.  No complaints overnight and vitals all remained stable.   Vitals:   07/18/22 0351 07/18/22 0806  BP: 133/66 (!) 145/75  Pulse: 79 70  Resp: 20 18  Temp: 98.6 F (37 C) 98 F (36.7 C)  SpO2: 97% 99%   Physical Exam: Cardiac:  RRR Lungs:  Clear throughout, no rales rhonchi or wheezing to note.  Incisions:  Left upper extremity brachial cut down. Noted erythema, incision is closed without hematoma or seroma to note.  Extremities:  Left upper extremity palpable radial and ulnar pulses. Hand is cool this morning and non painful. Equal bilateral hand grip strength at +5. Cap refill WNL. Brachial pulse is palpable and strong.   Abdomen:  Positive bowel sounds, soft, flat and non tender and non distended  Neurologic: AAOX3 and fully neuro intact. Answers all questions appropriately.   CBC    Component Value Date/Time   WBC 12.3 (H) 07/18/2022 0215   RBC 4.35 07/18/2022 0215   HGB 9.2 (L) 07/18/2022 0215   HGB 9.9 (L) 11/25/2019 1232   HCT 29.7 (L) 07/18/2022 0215   HCT 31.7 (L) 11/25/2019 1232   PLT 217 07/18/2022 0215   PLT 291 11/25/2019 1232   MCV 68.3 (L) 07/18/2022 0215   MCV 70 (L) 11/25/2019 1232   MCV 68 (L) 09/01/2014 1659   MCH 21.1 (L) 07/18/2022 0215   MCHC 31.0 07/18/2022 0215   RDW 17.4 (H)  07/18/2022 0215   RDW 18.1 (H) 11/25/2019 1232   RDW 18.2 (H) 09/01/2014 1659   LYMPHSABS 2.1 01/04/2022 1059   LYMPHSABS 3.3 (H) 11/25/2019 1232   LYMPHSABS 3.2 09/01/2014 1659   MONOABS 0.7 01/04/2022 1059   MONOABS 0.8 09/01/2014 1659   EOSABS 0.2 01/04/2022 1059   EOSABS 0.3 11/25/2019 1232   EOSABS 0.3 09/01/2014 1659   BASOSABS 0.1 01/04/2022 1059   BASOSABS 0.1 11/25/2019 1232   BASOSABS 0.1 09/01/2014 1659   BASOSABS 1 09/01/2014 1659    BMET    Component Value Date/Time   NA 137 07/18/2022 0215   NA 139 05/23/2020 1147   K 3.8 07/18/2022 0215   CL 106 07/18/2022 0215   CO2 25 07/18/2022 0215   GLUCOSE 100 (H) 07/18/2022 0215   BUN 14 07/18/2022 0215   BUN 11 05/23/2020 1147   CREATININE 0.88 07/18/2022 0215   CREATININE 0.95 09/01/2014 1659   CREATININE 0.80 08/15/2012 1609   CALCIUM 8.3 (L) 07/18/2022 0215   CALCIUM 9.7 09/01/2014 1659   GFRNONAA >60 07/18/2022 0215   GFRNONAA >60 09/01/2014 1659   GFRAA 67 05/23/2020 1147   GFRAA >60 09/01/2014 1659    INR    Component Value Date/Time   INR 1.1 07/17/2022 1906   INR 1.0  09/01/2014 1659     Intake/Output Summary (Last 24 hours) at 07/18/2022 1201 Last data filed at 07/18/2022 0950 Gross per 24 hour  Intake 1201.97 ml  Output 5 ml  Net 1196.97 ml     Assessment/Plan:  67 y.o. female is s/p visceral angiogram for celiac stenosis.  1 Day Post-Op   Plan: Patient to go to CAT scan this morning of the chest, abdomen, and pelvis.  Plan to follow per Dr. [name] MD.  DVT prophylaxis:  Heparin Infusion   Drema Pry [facility] 07/18/2022 12:01 PM

## 2022-07-18 NOTE — Plan of Care (Signed)
Patient AOX4, VSS throughout shift.  All meds given on time as ordered.  Heparin gtt started and on-going.  Purewick in place.  Diminished lungs, IS encouraged.  Left arm wrapped around surgical site for protection.  POC maintained, will continue to monitor.  Problem: Education: Goal: Knowledge of General Education information will improve Description: Including pain rating scale, medication(s)/side effects and non-pharmacologic comfort measures Outcome: Progressing   Problem: Health Behavior/Discharge Planning: Goal: Ability to manage health-related needs will improve Outcome: Progressing   Problem: Clinical Measurements: Goal: Ability to maintain clinical measurements within normal limits will improve Outcome: Progressing Goal: Will remain free from infection Outcome: Progressing Goal: Diagnostic test results will improve Outcome: Progressing Goal: Respiratory complications will improve Outcome: Progressing Goal: Cardiovascular complication will be avoided Outcome: Progressing   Problem: Activity: Goal: Risk for activity intolerance will decrease Outcome: Progressing   Problem: Nutrition: Goal: Adequate nutrition will be maintained Outcome: Progressing   Problem: Coping: Goal: Level of anxiety will decrease Outcome: Progressing   Problem: Elimination: Goal: Will not experience complications related to bowel motility Outcome: Progressing Goal: Will not experience complications related to urinary retention Outcome: Progressing   Problem: Pain Managment: Goal: General experience of comfort will improve Outcome: Progressing   Problem: Safety: Goal: Ability to remain free from injury will improve Outcome: Progressing   Problem: Skin Integrity: Goal: Risk for impaired skin integrity will decrease Outcome: Progressing   Problem: Education: Goal: Understanding of CV disease, CV risk reduction, and recovery process will improve Outcome: Progressing Goal:  Individualized Educational Video(s) Outcome: Progressing   Problem: Activity: Goal: Ability to return to baseline activity level will improve Outcome: Progressing   Problem: Cardiovascular: Goal: Ability to achieve and maintain adequate cardiovascular perfusion will improve Outcome: Progressing Goal: Vascular access site(s) Level 0-1 will be maintained Outcome: Progressing   Problem: Health Behavior/Discharge Planning: Goal: Ability to safely manage health-related needs after discharge will improve Outcome: Progressing

## 2022-07-18 NOTE — Consult Note (Signed)
ANTICOAGULATION CONSULT NOTE - Initial Consult  Pharmacy Consult for heparin Indication:  ASO visceral arteries  Allergies  Allergen Reactions   Peanuts [Peanut Oil]    Penicillins Other (See Comments)    Hives,rash,nausea,swelling,    Sulfa Antibiotics Other (See Comments)    Hives,rash,nausea,swelling   Corticosteroids     Other Reaction(s): RASH, SWELLING   Influenza Vac Split [Influenza Virus Vaccine]     Pleurisy  1996   Mobic [Meloxicam] Nausea Only    Renal insufficiency   Nickel    Nsaids Other (See Comments)    Decreased gfr    Penicillin G     Other Reaction(s): RASH , SWELLING   Prednisone Rash    Patient Measurements: Height: 5' 3"$  (160 cm) Weight: 68.5 kg (151 lb 0.2 oz) IBW/kg (Calculated) : 52.4 Heparin Dosing Weight: 66.4  Vital Signs: Temp: 98 F (36.7 C) (02/21 0806) Temp Source: Oral (02/21 0806) BP: 145/75 (02/21 0806) Pulse Rate: 70 (02/21 0806)  Labs: Recent Labs    07/17/22 1330 07/17/22 1906 07/18/22 0215 07/18/22 1413  HGB  --  9.5* 9.2*  --   HCT  --  31.1* 29.7*  --   PLT  --  232 217  --   APTT  --  39*  --   --   LABPROT  --  14.1  --   --   INR  --  1.1  --   --   HEPARINUNFRC  --   --  0.16* 0.24*  CREATININE 0.92  --  0.88  --      Estimated Creatinine Clearance: 57.6 mL/min (by C-G formula based on SCr of 0.88 mg/dL).   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 She underwent abdominal aortogram today for treatment and pharmacy has now been consulted to initiate heparin. Patient was in stable condition after the procedure, having tolerated it well. She does not take anticoagulation PTA.  Date Time aPTT/HL Rate/Comment       Baseline Labs: aPTT 39 sec, INR 1.1, hgb 9.5, plt 232  Goal of Therapy:  Heparin level 0.3-0.7 units/ml Monitor platelets by anticoagulation protocol: Yes    2/21 0215 HL 0.16, subtherapeutic 2/21 1413 HL 0.24, subtherapeutic Plan: Give heparin 1000 units IV x 1 Increase heparin drip rate to 1250 unit/hr Check anti-Xa level in 6 hours after rate change Continue to monitor H&H and platelets daily while on heparin gtt.  Tollie Eth III, PharmD 07/18/2022 3:08 PM

## 2022-07-19 LAB — CBC
HCT: 29.6 % — ABNORMAL LOW (ref 36.0–46.0)
Hemoglobin: 9.4 g/dL — ABNORMAL LOW (ref 12.0–15.0)
MCH: 21.3 pg — ABNORMAL LOW (ref 26.0–34.0)
MCHC: 31.8 g/dL (ref 30.0–36.0)
MCV: 67 fL — ABNORMAL LOW (ref 80.0–100.0)
Platelets: 195 10*3/uL (ref 150–400)
RBC: 4.42 MIL/uL (ref 3.87–5.11)
RDW: 17 % — ABNORMAL HIGH (ref 11.5–15.5)
WBC: 8.5 10*3/uL (ref 4.0–10.5)
nRBC: 0.6 % — ABNORMAL HIGH (ref 0.0–0.2)

## 2022-07-19 LAB — HEPARIN LEVEL (UNFRACTIONATED): Heparin Unfractionated: 0.42 IU/mL (ref 0.30–0.70)

## 2022-07-19 NOTE — Progress Notes (Signed)
Progress Note    07/19/2022 1:28 PM 2 Days Post-Op  Subjective:  Cheryl [name] is a 67 year old female who is now postop day 1 from a visceral angiogram for celiac artery stenosis.  Patient is resting comfortably in bed this morning.  She just returned from CT of her chest abdomen and pelvis.  Today she had a mesenteric angiogram with an arm approach and a brachial cutdown.  This was on her left upper extremity.  She denies any numbness or tingling in her left arm or hand.  She denies any weakness to her left arm as well.  She states that her arm is sore again this morning.  Denies any abdominal pain this morning or nausea and vomiting.  Denies any diarrhea or loose stools.  States she has no appetite. No complaints overnight and vitals all remained stable.    Vitals:   07/19/22 0420 07/19/22 0745  BP: 119/76 125/71  Pulse: 72 76  Resp: 18   Temp: 98.6 F (37 C) 98.5 F (36.9 C)  SpO2: 95% 97%   Physical Exam: Cardiac:  RRR Lungs:  Clear throughout, no rales rhonchi or wheezing to note  Incisions:  Left upper extremity brachial cut down. Noted erythema, incision is closed without hematoma or seroma to note.   Extremities:  Left upper extremity palpable radial and ulnar pulses. Hand is cool this morning and non painful. Equal bilateral hand grip strength at +5. Cap refill WNL. Brachial pulse is palpable and strong.    Abdomen:  Positive bowel sounds, soft, flat and non tender and non distended   Neurologic: AAOX3 and fully neuro intact. Answers all questions appropriately.    CBC    Component Value Date/Time   WBC 8.5 07/19/2022 0458   RBC 4.42 07/19/2022 0458   HGB 9.4 (L) 07/19/2022 0458   HGB 9.9 (L) 11/25/2019 1232   HCT 29.6 (L) 07/19/2022 0458   HCT 31.7 (L) 11/25/2019 1232   PLT 195 07/19/2022 0458   PLT 291 11/25/2019 1232   MCV 67.0 (L) 07/19/2022 0458   MCV 70 (L) 11/25/2019 1232   MCV 68 (L) 09/01/2014 1659   MCH 21.3 (L) 07/19/2022 0458   MCHC 31.8  07/19/2022 0458   RDW 17.0 (H) 07/19/2022 0458   RDW 18.1 (H) 11/25/2019 1232   RDW 18.2 (H) 09/01/2014 1659   LYMPHSABS 2.1 01/04/2022 1059   LYMPHSABS 3.3 (H) 11/25/2019 1232   LYMPHSABS 3.2 09/01/2014 1659   MONOABS 0.7 01/04/2022 1059   MONOABS 0.8 09/01/2014 1659   EOSABS 0.2 01/04/2022 1059   EOSABS 0.3 11/25/2019 1232   EOSABS 0.3 09/01/2014 1659   BASOSABS 0.1 01/04/2022 1059   BASOSABS 0.1 11/25/2019 1232   BASOSABS 0.1 09/01/2014 1659   BASOSABS 1 09/01/2014 1659    BMET    Component Value Date/Time   NA 137 07/18/2022 0215   NA 139 05/23/2020 1147   K 3.8 07/18/2022 0215   CL 106 07/18/2022 0215   CO2 25 07/18/2022 0215   GLUCOSE 100 (H) 07/18/2022 0215   BUN 14 07/18/2022 0215   BUN 11 05/23/2020 1147   CREATININE 0.88 07/18/2022 0215   CREATININE 0.95 09/01/2014 1659   CREATININE 0.80 08/15/2012 1609   CALCIUM 8.3 (L) 07/18/2022 0215   CALCIUM 9.7 09/01/2014 1659   GFRNONAA >60 07/18/2022 0215   GFRNONAA >60 09/01/2014 1659   GFRAA 67 05/23/2020 1147   GFRAA >60 09/01/2014 1659    INR    Component Value Date/Time  INR 1.1 07/17/2022 1906   INR 1.0 09/01/2014 1659     Intake/Output Summary (Last 24 hours) at 07/19/2022 1328 Last data filed at 07/19/2022 0440 Gross per 24 hour  Intake 1326.68 ml  Output --  Net 1326.68 ml     Assessment/Plan:  67 y.o. female is s/p visceral angiogram for celiac stenosis.  2 Days Post-Op  Patient is recovering as expected.    PLAN: Per Dr Ella Jubilee MD patient will be transferred to Dr Hurman Horn MD at [facility] for further surgical evaluation and workup. [facility] was called and patient information has been transferred. As soon as a bed is available [facility] will be notified to call report and transfer patient. Patient will be transported to [facility] by  [facility].    DVT prophylaxis:  Heparin Infusion   Drema Pry [facility] 07/19/2022 1:28  PM

## 2022-07-19 NOTE — Consult Note (Signed)
ANTICOAGULATION CONSULT NOTE  Pharmacy Consult for heparin Indication:  ASO visceral arteries  Allergies  Allergen Reactions   Peanuts [Peanut Oil]    Penicillins Other (See Comments)    Hives,rash,nausea,swelling,    Sulfa Antibiotics Other (See Comments)    Hives,rash,nausea,swelling   Corticosteroids     Other Reaction(s): RASH, SWELLING   Influenza Vac Split [Influenza Virus Vaccine]     Pleurisy  1996   Mobic [Meloxicam] Nausea Only    Renal insufficiency   Nickel    Nsaids Other (See Comments)    Decreased gfr    Penicillin G     Other Reaction(s): RASH , SWELLING   Prednisone Rash    Patient Measurements: Height: 5' 3"$  (160 cm) Weight: 68.5 kg (151 lb 0.2 oz) IBW/kg (Calculated) : 52.4 Heparin Dosing Weight: 66.4  Vital Signs: Temp: 98.6 F (37 C) (02/22 0420) Temp Source: Oral (02/22 0420) BP: 119/76 (02/22 0420) Pulse Rate: 72 (02/22 0420)  Labs: Recent Labs    07/17/22 1330 07/17/22 1906 07/17/22 1906 07/18/22 0215 07/18/22 1413 07/18/22 2139 07/19/22 0458  HGB  --  9.5*   < > 9.2*  --   --  9.4*  HCT  --  31.1*  --  29.7*  --   --  29.6*  PLT  --  232  --  217  --   --  195  APTT  --  39*  --   --   --   --   --   LABPROT  --  14.1  --   --   --   --   --   INR  --  1.1  --   --   --   --   --   HEPARINUNFRC  --   --    < > 0.16* 0.24* 0.41 0.42  CREATININE 0.92  --   --  0.88  --   --   --    < > = values in this interval not displayed.     Estimated Creatinine Clearance: 57.6 mL/min (by C-G formula based on SCr of 0.88 mg/dL).   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 She underwent abdominal aortogram today for treatment and pharmacy has now been consulted to initiate heparin. Patient was in stable condition after the procedure, having tolerated it well. She does not take anticoagulation PTA.  Date Time aPTT/HL Rate/Comment       Baseline Labs: aPTT 39 sec, INR 1.1, hgb 9.5, plt 232  Goal of Therapy:  Heparin level 0.3-0.7 units/ml Monitor platelets by anticoagulation protocol: Yes  2/21 0215 HL 0.16, subtherapeutic 2/21 1413 HL 0.24, subtherapeutic 2/21 2139 HL 0.41, therapeutic x 1 2/22 0458 HL 0.42, therapeutic x  2  Plan: Continue heparin drip rate at 1250 unit/hr Recheck HL w/ AM labs daily while therapeutic Continue to monitor H&H and platelets daily while on heparin gtt.  Renda Rolls, PharmD, [facility] 07/19/2022 6:13 AM

## 2022-07-20 ENCOUNTER — Encounter: Payer: Self-pay | Admitting: Internal Medicine

## 2022-07-20 LAB — CBC
HCT: 29.4 % — ABNORMAL LOW (ref 36.0–46.0)
Hemoglobin: 9.2 g/dL — ABNORMAL LOW (ref 12.0–15.0)
MCH: 20.9 pg — ABNORMAL LOW (ref 26.0–34.0)
MCHC: 31.3 g/dL (ref 30.0–36.0)
MCV: 66.7 fL — ABNORMAL LOW (ref 80.0–100.0)
Platelets: 190 10*3/uL (ref 150–400)
RBC: 4.41 MIL/uL (ref 3.87–5.11)
RDW: 16.9 % — ABNORMAL HIGH (ref 11.5–15.5)
WBC: 7.5 10*3/uL (ref 4.0–10.5)
nRBC: 0.5 % — ABNORMAL HIGH (ref 0.0–0.2)

## 2022-07-20 LAB — HEPARIN LEVEL (UNFRACTIONATED): Heparin Unfractionated: 0.43 IU/mL (ref 0.30–0.70)

## 2022-07-20 NOTE — Progress Notes (Signed)
Mobility Specialist - Progress Note   07/20/22 0926  Mobility  Activity Ambulated independently in hallway  Level of Assistance Independent  Assistive Device  (pushing IV pole)  Distance Ambulated (ft) 360 ft  Activity Response Tolerated well  $Mobility charge 1 Mobility   Pt amb 360 ft in the hallway indep, tolerated well. Pt returned to room, left standing at sink with needs within reach.   Candie Mile [facility] Specialist 07/20/22 9:27 AM

## 2022-07-20 NOTE — Care Management Important Message (Signed)
Important Message  Patient Details  Name: Cheryl [name] MRN: [id number] Date of Birth: 1955-11-28   Medicare Important Message Given:  Other (see comment)  Pending transfer to [facility] for further care.  Medicare IM withheld at this time due to pending acute - acute transfer.     Dannette Barbara 07/20/2022, 8:49 AM

## 2022-07-20 NOTE — Consult Note (Signed)
ANTICOAGULATION CONSULT NOTE  Pharmacy Consult for heparin Indication:  ASO visceral arteries  Allergies  Allergen Reactions   Peanuts [Peanut Oil]    Penicillins Other (See Comments)    Hives,rash,nausea,swelling,    Sulfa Antibiotics Other (See Comments)    Hives,rash,nausea,swelling   Corticosteroids     Other Reaction(s): RASH, SWELLING   Influenza Vac Split [Influenza Virus Vaccine]     Pleurisy  1996   Mobic [Meloxicam] Nausea Only    Renal insufficiency   Nickel    Nsaids Other (See Comments)    Decreased gfr    Penicillin G     Other Reaction(s): RASH , SWELLING   Prednisone Rash    Patient Measurements: Height: '5\' 3"'$  (160 cm) Weight: 68.5 kg (151 lb 0.2 oz) IBW/kg (Calculated) : 52.4 Heparin Dosing Weight: 66.4  Vital Signs: Temp: 97.6 F (36.4 C) (02/23 0433) Temp Source: Oral (02/22 2009) BP: 101/68 (02/23 0433) Pulse Rate: 72 (02/23 0433)  Labs: Recent Labs    07/17/22 1330 07/17/22 1906 07/17/22 1906 07/18/22 0215 07/18/22 1413 07/18/22 2139 07/19/22 0458 07/20/22 0431  HGB  --  9.5*   < > 9.2*  --   --  9.4* 9.2*  HCT  --  31.1*   < > 29.7*  --   --  29.6* 29.4*  PLT  --  232   < > 217  --   --  195 190  APTT  --  39*  --   --   --   --   --   --   LABPROT  --  14.1  --   --   --   --   --   --   INR  --  1.1  --   --   --   --   --   --   HEPARINUNFRC  --   --   --  0.16*   < > 0.41 0.42 0.43  CREATININE 0.92  --   --  0.88  --   --   --   --    < > = values in this interval not displayed.     Estimated Creatinine Clearance: 57.6 mL/min (by C-G formula based on SCr of 0.88 mg/dL).   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 She underwent abdominal aortogram today for treatment and pharmacy has now been consulted to initiate heparin. Patient was in stable condition after the procedure, having tolerated it well. She does not take anticoagulation PTA.  Date Time aPTT/HL Rate/Comment       Baseline Labs: aPTT 39 sec, INR 1.1, hgb 9.5, plt 232  Goal of Therapy:  Heparin level 0.3-0.7 units/ml Monitor platelets by anticoagulation protocol: Yes  2/21  0215 HL 0.16, subtherapeutic 2/21 1413 HL 0.24, subtherapeutic 2/21 2139 HL 0.41, therapeutic x 1 2/22 0458 HL 0.42, therapeutic x 2 2/23 0431 HL 0.43, therapeutic x 3  Plan: Continue heparin drip rate at 1250 unit/hr Recheck HL w/ AM labs daily while therapeutic Continue to monitor H&H and platelets daily while on heparin gtt.  Renda Rolls, PharmD, [facility] 07/20/2022 5:29 AM

## 2022-07-21 DIAGNOSIS — Z83438 Family history of other disorder of lipoprotein metabolism and other lipidemia: Secondary | ICD-10-CM | POA: Diagnosis not present

## 2022-07-21 DIAGNOSIS — Z87440 Personal history of urinary (tract) infections: Secondary | ICD-10-CM | POA: Diagnosis not present

## 2022-07-21 DIAGNOSIS — Z8261 Family history of arthritis: Secondary | ICD-10-CM | POA: Diagnosis not present

## 2022-07-21 DIAGNOSIS — Z79899 Other long term (current) drug therapy: Secondary | ICD-10-CM | POA: Diagnosis not present

## 2022-07-21 DIAGNOSIS — Z887 Allergy status to serum and vaccine status: Secondary | ICD-10-CM | POA: Diagnosis not present

## 2022-07-21 DIAGNOSIS — K2960 Other gastritis without bleeding: Secondary | ICD-10-CM | POA: Diagnosis not present

## 2022-07-21 DIAGNOSIS — M1990 Unspecified osteoarthritis, unspecified site: Secondary | ICD-10-CM | POA: Diagnosis not present

## 2022-07-21 DIAGNOSIS — Z8249 Family history of ischemic heart disease and other diseases of the circulatory system: Secondary | ICD-10-CM | POA: Diagnosis not present

## 2022-07-21 DIAGNOSIS — F17210 Nicotine dependence, cigarettes, uncomplicated: Secondary | ICD-10-CM | POA: Diagnosis not present

## 2022-07-21 DIAGNOSIS — Z01810 Encounter for preprocedural cardiovascular examination: Secondary | ICD-10-CM | POA: Diagnosis not present

## 2022-07-21 DIAGNOSIS — Z886 Allergy status to analgesic agent status: Secondary | ICD-10-CM | POA: Diagnosis not present

## 2022-07-21 DIAGNOSIS — E782 Mixed hyperlipidemia: Secondary | ICD-10-CM | POA: Diagnosis not present

## 2022-07-21 DIAGNOSIS — N183 Chronic kidney disease, stage 3 unspecified: Secondary | ICD-10-CM | POA: Diagnosis not present

## 2022-07-21 DIAGNOSIS — R109 Unspecified abdominal pain: Secondary | ICD-10-CM | POA: Diagnosis not present

## 2022-07-21 DIAGNOSIS — I728 Aneurysm of other specified arteries: Secondary | ICD-10-CM | POA: Diagnosis present

## 2022-07-21 DIAGNOSIS — D560 Alpha thalassemia: Secondary | ICD-10-CM | POA: Diagnosis not present

## 2022-07-21 DIAGNOSIS — I447 Left bundle-branch block, unspecified: Secondary | ICD-10-CM | POA: Diagnosis not present

## 2022-07-21 DIAGNOSIS — Z888 Allergy status to other drugs, medicaments and biological substances status: Secondary | ICD-10-CM | POA: Diagnosis not present

## 2022-07-21 DIAGNOSIS — Z880 Allergy status to penicillin: Secondary | ICD-10-CM | POA: Diagnosis not present

## 2022-07-21 DIAGNOSIS — Z91010 Allergy to peanuts: Secondary | ICD-10-CM | POA: Diagnosis not present

## 2022-07-21 DIAGNOSIS — K2970 Gastritis, unspecified, without bleeding: Secondary | ICD-10-CM | POA: Diagnosis not present

## 2022-07-21 DIAGNOSIS — I6522 Occlusion and stenosis of left carotid artery: Secondary | ICD-10-CM | POA: Diagnosis not present

## 2022-07-21 DIAGNOSIS — R6881 Early satiety: Secondary | ICD-10-CM | POA: Diagnosis not present

## 2022-07-21 DIAGNOSIS — K551 Chronic vascular disorders of intestine: Secondary | ICD-10-CM | POA: Diagnosis not present

## 2022-07-21 DIAGNOSIS — G8929 Other chronic pain: Secondary | ICD-10-CM | POA: Diagnosis not present

## 2022-07-21 DIAGNOSIS — R1110 Vomiting, unspecified: Secondary | ICD-10-CM | POA: Diagnosis not present

## 2022-07-21 DIAGNOSIS — K589 Irritable bowel syndrome without diarrhea: Secondary | ICD-10-CM | POA: Diagnosis not present

## 2022-07-21 DIAGNOSIS — I771 Stricture of artery: Secondary | ICD-10-CM | POA: Diagnosis not present

## 2022-07-21 DIAGNOSIS — K30 Functional dyspepsia: Secondary | ICD-10-CM | POA: Diagnosis not present

## 2022-07-21 DIAGNOSIS — I774 Celiac artery compression syndrome: Secondary | ICD-10-CM | POA: Diagnosis not present

## 2022-07-21 DIAGNOSIS — R079 Chest pain, unspecified: Secondary | ICD-10-CM | POA: Diagnosis not present

## 2022-07-21 DIAGNOSIS — I739 Peripheral vascular disease, unspecified: Secondary | ICD-10-CM | POA: Diagnosis not present

## 2022-07-21 DIAGNOSIS — Z89019 Acquired absence of unspecified thumb: Secondary | ICD-10-CM | POA: Diagnosis not present

## 2022-07-21 DIAGNOSIS — R112 Nausea with vomiting, unspecified: Secondary | ICD-10-CM | POA: Diagnosis not present

## 2022-07-21 DIAGNOSIS — Z90710 Acquired absence of both cervix and uterus: Secondary | ICD-10-CM | POA: Diagnosis not present

## 2022-07-21 DIAGNOSIS — Z882 Allergy status to sulfonamides status: Secondary | ICD-10-CM | POA: Diagnosis not present

## 2022-07-21 LAB — CBC
HCT: 29.3 % — ABNORMAL LOW (ref 36.0–46.0)
Hemoglobin: 9.1 g/dL — ABNORMAL LOW (ref 12.0–15.0)
MCH: 21 pg — ABNORMAL LOW (ref 26.0–34.0)
MCHC: 31.1 g/dL (ref 30.0–36.0)
MCV: 67.7 fL — ABNORMAL LOW (ref 80.0–100.0)
Platelets: 148 10*3/uL — ABNORMAL LOW (ref 150–400)
RBC: 4.33 MIL/uL (ref 3.87–5.11)
RDW: 16.9 % — ABNORMAL HIGH (ref 11.5–15.5)
WBC: 8.3 10*3/uL (ref 4.0–10.5)
nRBC: 0.4 % — ABNORMAL HIGH (ref 0.0–0.2)

## 2022-07-21 LAB — HEPARIN LEVEL (UNFRACTIONATED): Heparin Unfractionated: 0.3 IU/mL (ref 0.30–0.70)

## 2022-07-21 NOTE — Discharge Summary (Signed)
[facility]    Discharge Summary    Patient ID:  Cheryl [name] MRN: [id number] DOB/AGE: [birth year] 67 y.o.  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 Under went mesenteric angiogram and celiac artery angiogram. Found to have high grade stenosis precluding endovascular intervention,  Hospital Course:  Cheryl [name] is a 67 y.o. female is S/P  Procedure(s): VISCERAL ANGIOGRAPHY Extubated: POD # 3 Physical exam: Abdomen soft, mild tenderness, non distended, no rebound/guarding Post-op wounds clean, dry, intact or healing well Pt. Ambulating, voiding and unable to tolerate PO Pt pain controlled  Labs as below Complications:none  Consults:    Significant Diagnostic Studies: CBC Lab Results  Component Value Date   WBC 8.3 07/21/2022   HGB 9.1 (L) 07/21/2022   HCT 29.3 (L) 07/21/2022   MCV 67.7 (L) 07/21/2022   PLT 148 (L) 07/21/2022    BMET    Component Value Date/Time   NA 137 07/18/2022 0215   NA 139 05/23/2020 1147   K 3.8 07/18/2022 0215   CL 106 07/18/2022  0215   CO2 25 07/18/2022 0215   GLUCOSE 100 (H) 07/18/2022 0215   BUN 14 07/18/2022 0215   BUN 11 05/23/2020 1147   CREATININE 0.88 07/18/2022 0215   CREATININE 0.95 09/01/2014 1659   CREATININE 0.80 08/15/2012 1609   CALCIUM 8.3 (L) 07/18/2022 0215   CALCIUM 9.7 09/01/2014 1659   GFRNONAA >60 07/18/2022 0215   GFRNONAA >60 09/01/2014 1659   GFRAA 67 05/23/2020 1147   GFRAA >60 09/01/2014 1659   COAG Lab Results  Component Value Date   INR 1.1 07/17/2022   INR 1.0 09/01/2014     Disposition:  Discharge to : Transfer to [facility]  Allergies as of 07/21/2022       Reactions   Peanuts [peanut Oil]    Penicillins Other (See Comments)   Hives,rash,nausea,swelling,    Sulfa Antibiotics Other (See Comments)   Hives,rash,nausea,swelling   Corticosteroids    Other Reaction(s): RASH, SWELLING   Influenza Vac Split [influenza Virus Vaccine]    Pleurisy  1996   Mobic [meloxicam] Nausea Only   Renal insufficiency   Nickel    Nsaids Other (See Comments)   Decreased gfr   Penicillin G    Other Reaction(s): RASH , SWELLING   Prednisone Rash        Medication List     STOP taking these medications    HYDROcodone-acetaminophen 5-325 MG tablet Commonly known as: NORCO/VICODIN   propranolol 10 MG tablet Commonly known as: INDERAL   rosuvastatin 10 MG tablet Commonly  known as: CRESTOR   traMADol 50 MG tablet Commonly known as: ULTRAM       TAKE these medications    albuterol 108 (90 Base) MCG/ACT inhaler Commonly known as: ProAir HFA Inhale 1-2 puffs into the lungs every 6 (six) hours as needed for wheezing or shortness of breath.   ascorbic acid 500 MG tablet Commonly known as: VITAMIN C Take 500 mg by mouth daily.   aspirin EC 81 MG tablet Take 1 tablet (81 mg total) by mouth daily.   buPROPion 75 MG tablet Commonly known as: WELLBUTRIN Take 1 tablet (75 mg total) by mouth 2 (two) times daily.   CALCIUM 1200 PO Take 1 tablet by mouth daily.   docusate  sodium 100 MG capsule Commonly known as: COLACE Take 100 mg by mouth 2 (two) times daily.   gabapentin 300 MG capsule Commonly known as: NEURONTIN Take 1 capsule (300 mg total) by mouth 3 (three) times daily.   metoprolol succinate 25 MG 24 hr tablet Commonly known as: TOPROL-XL Take 1 tablet (25 mg total) by mouth daily.   nitroGLYCERIN 0.4 MG SL tablet Commonly known as: NITROSTAT DISSOLVE 1 TABLET UNDER THE TONGUE EVERY 5 MINUTES AS  NEEDED FOR CHEST PAIN. MAX  OF 3 TABLETS IN 15 MINUTES. CALL 911 IF PAIN PERSISTS.   pantoprazole 40 MG tablet Commonly known as: PROTONIX Take 1 tablet (40 mg total) by mouth 2 (two) times daily for 14 days.   Prasterone (DHEA) 10 MG Caps every other day.   progesterone 200 MG capsule Commonly known as: PROMETRIUM Take 200 mg by mouth at bedtime.   rOPINIRole 1 MG tablet Commonly known as: REQUIP Take 1 tablet (1 mg total) by mouth 3 (three) times daily.   venlafaxine XR 75 MG 24 hr capsule Commonly known as: EFFEXOR-XR TAKE 1 CAPSULE(75 MG) BY MOUTH DAILY WITH BREAKFAST   Vitamin D 125 MCG (5000 UT) Caps Take by mouth.       Verbal and written Discharge instructions given to the patient. Wound care per Discharge AVS   Signed: Evaristo Bury, MD  07/21/2022, 8:33 AM

## 2022-07-21 NOTE — Consult Note (Signed)
ANTICOAGULATION CONSULT NOTE  Pharmacy Consult for heparin Indication:  ASO visceral arteries  Allergies  Allergen Reactions   Peanuts [Peanut Oil]    Penicillins Other (See Comments)    Hives,rash,nausea,swelling,    Sulfa Antibiotics Other (See Comments)    Hives,rash,nausea,swelling   Corticosteroids     Other Reaction(s): RASH, SWELLING   Influenza Vac Split [Influenza Virus Vaccine]     Pleurisy  1996   Mobic [Meloxicam] Nausea Only    Renal insufficiency   Nickel    Nsaids Other (See Comments)    Decreased gfr    Penicillin G     Other Reaction(s): RASH , SWELLING   Prednisone Rash    Patient Measurements: Height: '5\' 3"'$  (160 cm) Weight: 68.5 kg (151 lb 0.2 oz) IBW/kg (Calculated) : 52.4 Heparin Dosing Weight: 66.4  Vital Signs: Temp: 98.2 F (36.8 C) (02/24 0415) Temp Source: Oral (02/24 0415) BP: 128/71 (02/24 0415) Pulse Rate: 69 (02/24 0415)  Labs: Recent Labs    07/19/22 0458 07/20/22 0431 07/21/22 0444  HGB 9.4* 9.2* 9.1*  HCT 29.6* 29.4* 29.3*  PLT 195 190 148*  HEPARINUNFRC 0.42 0.43 0.30     Estimated Creatinine Clearance: 57.6 mL/min (by C-G formula based on SCr of 0.88 mg/dL).   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 She underwent abdominal aortogram today for treatment and pharmacy  has now been consulted to initiate heparin. Patient was in stable condition after the procedure, having tolerated it well. She does not take anticoagulation PTA.  Date Time aPTT/HL Rate/Comment       Baseline Labs: aPTT 39 sec, INR 1.1, hgb 9.5, plt 232  Goal of Therapy:  Heparin level 0.3-0.7 units/ml Monitor platelets by anticoagulation protocol: Yes  2/21 0215 HL 0.16, subtherapeutic 2/21 1413 HL 0.24, subtherapeutic 2/21 2139 HL 0.41, therapeutic x 1 2/22 0458 HL 0.42, therapeutic x 2 2/23 0431 HL 0.43, therapeutic x 3 2/24 0444 HL 0.30, therapeutic x 4  Plan: Continue heparin drip rate at 1250 unit/hr Recheck HL w/ AM labs daily while therapeutic Continue to monitor H&H and platelets daily while on heparin gtt.  Renda Rolls, PharmD, [facility] 07/21/2022 5:44 AM

## 2022-07-21 NOTE — Progress Notes (Signed)
Patient seen and examined. Complains of continued abdominal pain, pressure and nausea/emesis. Unchanged from prior exam.  Gen- Alert, NAD CV: RR Lungs- CTA ABD- soft, tender to palpation, no rebound, no guarding.  Stable for transfer to [facility] for further intervention and care.

## 2022-07-22 ENCOUNTER — Encounter (INDEPENDENT_AMBULATORY_CARE_PROVIDER_SITE_OTHER): Payer: Self-pay | Admitting: Vascular Surgery

## 2022-07-23 NOTE — Telephone Encounter (Signed)
According to chart pt was transferred to [facility] on 07/21/2022.

## 2022-07-26 ENCOUNTER — Telehealth: Payer: Self-pay | Admitting: *Deleted

## 2022-07-26 NOTE — Transitions of Care (Post Inpatient/ED Visit) (Signed)
   07/26/2022  Name: Cheryl [name] MRN: [id number] DOB: Oct 30, 1955  Today's TOC FU Call Status: Today's TOC FU Call Status:: Successful TOC FU Call Competed TOC FU Call Complete Date: 07/26/22  Transition Care Management Follow-up Telephone Call Date of Discharge: 07/25/22 Discharge Facility: Other ([place]) Name of Other (Non[facility]) Discharge Facility: [facility] Type of Discharge: Inpatient Admission Primary Inpatient Discharge Diagnosis:: Tobacco use disorder How have you been since you were released from the hospital?: Better Any questions or concerns?: No  Items Reviewed: Did you receive and understand the discharge instructions provided?: Yes Medications obtained and verified?: Yes (Medications Reviewed) Any new allergies since your discharge?: No Dietary orders reviewed?: No Do you have support at home?: Yes People in Home: child(ren), adult, spouse Name of Support/Comfort Primary Source: [place] husband and son  [place] and Equipment/Supplies: [place] Ordered?: No Any new equipment or medical supplies ordered?: No  Functional Questionnaire: Do you need assistance with bathing/showering or dressing?: No Do you need assistance with meal preparation?: No Do you need assistance with eating?: No Do you have difficulty maintaining continence: No Do you need assistance with getting out of bed/getting out of a chair/moving?: No Do you have difficulty managing or taking your medications?: No  Folllow up appointments reviewed: PCP Follow-up appointment confirmed?: Yes Date of PCP follow-up appointment?: 08/01/22 Follow-up Provider: Dr Derrel Nip 1:30 [facility] Follow-up appointment confirmed?: Yes Date of Specialist follow-up appointment?: 07/31/22 Follow-Up Specialty Provider:: Dr Tamala Julian 10:30 Do you need transportation to your follow-up appointment?: No Do you understand care options if your condition(s) worsen?: Yes-patient verbalized  understanding  SDOH Interventions Today    Flowsheet Row Most Recent Value  SDOH Interventions   Food Insecurity Interventions Intervention Not Indicated  Housing Interventions Intervention Not Indicated  Transportation Interventions Intervention Not Indicated      Interventions Today    Flowsheet Row Most Recent Value  General Interventions   General Interventions Discussed/Reviewed General Interventions Discussed  [RN discussed dr visits and transportation. Patient stated that husband or son can take her]       [facility] [phone]

## 2022-07-26 NOTE — Progress Notes (Addendum)
[name] [address] [place] [address] Phone: [phone] Subjective:   Cheryl [name], am serving as a scribe for Dr. Antoine Primas.  I'm seeing this patient by the request  of:  Sherlene Shams, MD  CC:   [id number]  Cheryl [name] is a 67 y.o. female coming in with complaint of back and neck pain. OMT 06/19/2022. Patient states that her L shoulder and back is tight since being in hospital.   Patient notes having dizzy spells and an unsteadiness in her legs. Now notices weakness and sharp pain behind the L knee for a day. Was standing a lot yesterday.   Medications patient has been prescribed: Gabapentin  Taking:         Reviewed prior external information including notes and imaging from previsou exam, outside providers and external EMR if available.   As well as notes that were available from care everywhere and other healthcare systems.  Past medical history, social, surgical and family history all reviewed in electronic medical record.  No pertanent information unless stated regarding to the chief complaint.   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   Arrhythmia    left bundle branch block   Arthritis    Blood in stool    Chicken pox    Generalized headaches    History of blood transfusion    Kidney disease, chronic, stage III (GFR 30-59 ml/min) (HCC)    LBBB (left bundle branch block)    Renal disorder    Thyroid disease    UTI (lower urinary tract infection)     Allergies  Allergen Reactions   Peanuts [Peanut Oil]    Penicillins Other (See Comments)    Hives,rash,nausea,swelling,    Sulfa Antibiotics Other (See Comments)    Hives,rash,nausea,swelling   Corticosteroids     Other Reaction(s): RASH, SWELLING   Influenza Vac Split  [Influenza Virus Vaccine]     Pleurisy  1996   Mobic [Meloxicam] Nausea Only    Renal insufficiency   Nickel    Nsaids Other (See Comments)    Decreased gfr    Penicillin G     Other Reaction(s): RASH , SWELLING   Prednisone Rash     Review of Systems:  No headache, visual changes, nausea, vomiting, diarrhea, constipation, dizziness, abdominal pain,mph nodes, body aches, joint swelling, chest pain, shortness of breath, mood changes. POSITIVE muscle aches, fatigue, abdominal pain  Objective  Blood pressure 118/60, pulse 72, height 5\' 3"  (1.6 m), weight 150 lb (68 kg), SpO2 96 %.   General: No apparent distress alert and oriented x3 mood and affect normal, dressed appropriately.  HEENT: Pupils equal, extraocular movements intact  Respiratory: Patient's speak in full sentences and does not appear short of breath  Cardiovascular: No lower extremity edema, non tender, no erythema  Low back exam does have some loss of lordosis.  Some tenderness to palpation of the paraspinal musculature.  Tightness with Pearlean Brownie right greater than left. Patient is ambulatory Left knee does have some crepitus noted with movement.  Some lateral tracking of the patella noted.  Some atrophy of the VMO noted. Patient is ambulatory    Assessment and Plan:  Splenic artery aneurysm [facility]) Patient was hospitalized for this and does not have a true plan at the moment.  Is following  up with vascular in the near future.  Manipulation until she is seen by them.  May need to also check patient's celiac blood vessel.  Could be potentially contributing to some of this as well.  Follow-up again in 6 to 8 weeks        The above documentation has been reviewed and is accurate and complete Judi Saa, DO        Note: This dictation was prepared with Dragon dictation along with smaller phrase technology. Any transcriptional errors that result from this process are unintentional.

## 2022-07-31 ENCOUNTER — Ambulatory Visit: Payer: PPO | Admitting: Family Medicine

## 2022-07-31 ENCOUNTER — Ambulatory Visit: Payer: Self-pay

## 2022-07-31 ENCOUNTER — Ambulatory Visit (INDEPENDENT_AMBULATORY_CARE_PROVIDER_SITE_OTHER): Payer: PPO

## 2022-07-31 VITALS — BP 118/60 | HR 72 | Ht 63.0 in | Wt 150.0 lb

## 2022-07-31 DIAGNOSIS — I728 Aneurysm of other specified arteries: Secondary | ICD-10-CM | POA: Diagnosis not present

## 2022-07-31 DIAGNOSIS — D508 Other iron deficiency anemias: Secondary | ICD-10-CM

## 2022-07-31 DIAGNOSIS — I739 Peripheral vascular disease, unspecified: Secondary | ICD-10-CM

## 2022-07-31 DIAGNOSIS — M25562 Pain in left knee: Secondary | ICD-10-CM

## 2022-07-31 DIAGNOSIS — M545 Low back pain, unspecified: Secondary | ICD-10-CM | POA: Diagnosis not present

## 2022-07-31 DIAGNOSIS — G8929 Other chronic pain: Secondary | ICD-10-CM | POA: Diagnosis not present

## 2022-07-31 NOTE — Patient Instructions (Addendum)
Knee xray today Hinged brace See me again in 8 weeks

## 2022-07-31 NOTE — Assessment & Plan Note (Signed)
Hemoglobin recently of 10 will need to continue to be monitored as well.  Patient states that she will follow-up with either vascular or primary care with this.

## 2022-07-31 NOTE — Assessment & Plan Note (Signed)
Patient was hospitalized for this and does not have a true plan at the moment.  Is following up with vascular in the near future.  Manipulation until she is seen by them.  May need to also check patient's celiac blood vessel.  Could be potentially contributing to some of this as well.  Follow-up again in 6 to 8 weeks

## 2022-08-01 ENCOUNTER — Encounter: Payer: Self-pay | Admitting: Internal Medicine

## 2022-08-01 ENCOUNTER — Ambulatory Visit (INDEPENDENT_AMBULATORY_CARE_PROVIDER_SITE_OTHER): Payer: PPO | Admitting: Internal Medicine

## 2022-08-01 VITALS — BP 111/66 | HR 74 | Temp 97.9°F | Ht 63.0 in | Wt 151.2 lb

## 2022-08-01 DIAGNOSIS — K2970 Gastritis, unspecified, without bleeding: Secondary | ICD-10-CM

## 2022-08-01 DIAGNOSIS — K2900 Acute gastritis without bleeding: Secondary | ICD-10-CM | POA: Diagnosis not present

## 2022-08-01 DIAGNOSIS — I774 Celiac artery compression syndrome: Secondary | ICD-10-CM

## 2022-08-01 DIAGNOSIS — B9681 Helicobacter pylori [H. pylori] as the cause of diseases classified elsewhere: Secondary | ICD-10-CM | POA: Diagnosis not present

## 2022-08-01 DIAGNOSIS — M48061 Spinal stenosis, lumbar region without neurogenic claudication: Secondary | ICD-10-CM | POA: Diagnosis not present

## 2022-08-01 DIAGNOSIS — R110 Nausea: Secondary | ICD-10-CM

## 2022-08-01 DIAGNOSIS — K3189 Other diseases of stomach and duodenum: Secondary | ICD-10-CM | POA: Diagnosis not present

## 2022-08-01 MED ORDER — METOCLOPRAMIDE HCL 10 MG PO TABS: 10.0000 mg | ORAL_TABLET | Freq: Every day | ORAL | 2 refills | Status: DC

## 2022-08-01 MED ORDER — HYDROCODONE-ACETAMINOPHEN 5-325 MG PO TABS: 1.0000 | ORAL_TABLET | Freq: Two times a day (BID) | ORAL | 0 refills | Status: DC | PRN

## 2022-08-01 NOTE — Assessment & Plan Note (Signed)
Secondary to known spinal stenosis.  She has been advised to avoid NSAIDs .  Refill history confirmed via [place] Controlled Substance databas, accessed by me today.. refills for hydrocodone given

## 2022-08-01 NOTE — Patient Instructions (Addendum)
Your Medicare Annual Wellness visit is due, please schedule this appointment at checkout.  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  Premier Protein.  This will supply lots of nutrition in a small volume

## 2022-08-01 NOTE — Progress Notes (Signed)
Subjective:  Patient ID: Cheryl [name], female    DOB: 01/26/1956  Age: 67 y.o. MRN: [id number]  CC: The primary encounter diagnosis was Helicobacter pylori gastritis. 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  She has constant nausea ,mid epigastirc abd pain and pressure ,  anorexia, unable to eat more than one meal daily,  often vomit 2 hours after eating .  Since November.  No weight loss by our scales.   Gastric emptying study  was borderlline and EGD was doneL no H pylori found    Outpatient Medications Prior to Visit  Medication Sig Dispense Refill   albuterol (PROAIR HFA) 108 (90 Base) MCG/ACT inhaler Inhale 1-2 puffs into the lungs every 6 (six) hours as needed for wheezing or shortness of breath. 8 g 2   aspirin EC 81 MG tablet Take 1 tablet (81 mg total) by mouth daily.     buPROPion (WELLBUTRIN) 75 MG tablet Take 1 tablet (75 mg total) by mouth 2 (two) times daily. 180 tablet 0   Calcium Carbonate-Vit D-Min (CALCIUM 1200 PO) Take 1 tablet by mouth daily.     Cholecalciferol (VITAMIN D) 125 MCG (5000 UT) CAPS Take by mouth.     docusate sodium (COLACE) 100 MG capsule Take 100 mg by mouth 2 (two) times daily.     gabapentin (NEURONTIN) 300 MG capsule Take 1 capsule (300 mg total) by mouth 3 (three) times daily. 270 capsule 3   metoprolol succinate  (TOPROL-XL) 25 MG 24 hr tablet Take 1 tablet (25 mg total) by mouth daily. 90 tablet 3   nitroGLYCERIN (NITROSTAT) 0.4 MG SL tablet DISSOLVE 1 TABLET UNDER THE TONGUE EVERY 5 MINUTES AS  NEEDED FOR CHEST PAIN. MAX  OF 3 TABLETS IN 15 MINUTES. CALL 911 IF PAIN PERSISTS. 100 tablet 3   Prasterone, DHEA, 10 MG CAPS every other day.     progesterone (PROMETRIUM) 200 MG capsule Take 200 mg by mouth at bedtime.     rOPINIRole (REQUIP) 1 MG tablet Take 1 tablet (1 mg total) by mouth 3 (three) times daily. 90 tablet 0   venlafaxine XR (EFFEXOR-XR) 75 MG 24 hr capsule TAKE 1 CAPSULE(75 MG) BY MOUTH DAILY WITH BREAKFAST 90 capsule 3   vitamin C (ASCORBIC ACID) 500 MG tablet Take 500 mg by mouth daily.     pantoprazole (PROTONIX) 40 MG tablet Take 1 tablet (40 mg total) by mouth 2 (two) times daily for 14 days. 30 tablet 0   No facility-administered medications prior to visit.    Review of Systems;  Patient denies headache, fevers, malaise, unintentional weight loss, skin rash, eye pain, sinus congestion and sinus pain, sore throat, dysphagia,  hemoptysis , cough, dyspnea, wheezing, chest pain, palpitations, orthopnea, edema, abdominal pain, nausea, melena, diarrhea, constipation, flank pain, dysuria, hematuria, urinary  Frequency, nocturia, numbness, tingling, seizures,  Focal weakness, Loss of consciousness,  Tremor, insomnia, depression, anxiety, and suicidal ideation.      Objective:  BP 111/66   Pulse 74   Temp 97.9 F (36.6 C) (Oral)   Ht '5\' 3"'$  (1.6 m)   Wt 151 lb 3.2 oz (68.6 kg)   SpO2 97%   BMI 26.78 kg/m   BP Readings from Last 3 Encounters:  08/01/22 111/66  07/31/22 118/60  07/21/22 (!) 143/82    Wt Readings from Last 3 Encounters:  08/01/22 151 lb 3.2 oz (68.6 kg)  07/31/22 150 lb (68 kg)  07/17/22 151 lb 0.2 oz (68.5 kg)    Physical Exam  Lab Results  Component Value Date   HGBA1C 5.5 05/02/2022   HGBA1C 5.1 10/26/2015   HGBA1C 5.4 11/25/2013    Lab Results   Component Value Date   CREATININE 0.88 07/18/2022   CREATININE 0.92 07/17/2022   CREATININE 0.89 06/29/2022    Lab Results  Component Value Date   WBC 8.3 07/21/2022   HGB 9.1 (L) 07/21/2022   HCT 29.3 (L) 07/21/2022   PLT 148 (L) 07/21/2022   GLUCOSE 100 (H) 07/18/2022   CHOL 108 05/02/2022   TRIG 273.0 (H) 05/02/2022   HDL 29.10 (L) 05/02/2022   LDLDIRECT 58.0 05/02/2022   LDLCALC 44 08/01/2021   ALT 10 05/02/2022   AST 11 05/02/2022   NA 137 07/18/2022   K 3.8 07/18/2022   CL 106 07/18/2022   CREATININE 0.88 07/18/2022   BUN 14 07/18/2022   CO2 25 07/18/2022   TSH 2.20 05/02/2022   INR 1.1 07/17/2022   HGBA1C 5.5 05/02/2022   MICROALBUR 1.1 05/02/2022    CT Angio Chest/Abd/Pel for Dissection W and/or W/WO  Result Date: 07/18/2022 CLINICAL DATA:  89 female with history of chronic mesenteric ischemia. EXAM: CT ANGIOGRAPHY CHEST, ABDOMEN AND PELVIS TECHNIQUE: Multidetector CT imaging through the chest, abdomen and pelvis was performed using the standard protocol during bolus administration of intravenous contrast. Multiplanar reconstructed images and MIPs were obtained and reviewed to evaluate the vascular anatomy. RADIATION DOSE REDUCTION: This exam was performed according to the departmental dose-optimization program which includes automated exposure control, adjustment of the mA and/or kV according to patient size and/or use of iterative reconstruction technique. CONTRAST:  120m OMNIPAQUE IOHEXOL 350 MG/ML SOLN COMPARISON:  CTA chest from 01/30/2019 CTA abdomen 08/17/2015 FINDINGS: CTA CHEST FINDINGS VASCULAR Preferential opacification of the thoracic aorta. No evidence of thoracic aortic aneurysm or dissection. Scattered atherosclerotic calcifications about the aortic arch and descending thoracic aorta. Normal heart size. No pericardial effusion. Sinues of Valsalva: 27 mm 28 x 27 mm ,unchanged Sinotubular Junction: 26 mm ,unchanged Ascending Aorta: 33 mm ,unchanged  Aortic Arch: 32 mm ,unchanged Descending aorta: 25 mm at the level of the carina ,unchanged Branch vessels: Conventional branching pattern. Scattered atherosclerotic changes without evidence of significant ostial stenosis. Coronary arteries: Normal origins and courses. Severe atherosclerotic calcifications about the proximal anterior descending and circumflex coronary arteries. Main pulmonary artery: 21 mm ,unchanged. No evidence of central pulmonary embolism. Pulmonary veins: No anomalous pulmonary venous return. No evidence of left atrial appendage thrombus. NON VASCULAR Mediastinum/Nodes: No enlarged mediastinal, hilar, or axillary lymph nodes. Thyroid gland, trachea, and esophagus demonstrate no significant findings. Lungs/Pleura: No focal consolidations. No suspicious pulmonary nodules. No pleural effusion or pneumothorax. Musculoskeletal: No chest wall abnormality. No acute or significant osseous findings. Review of the MIP images confirms the above findings. CTA ABDOMEN AND PELVIS FINDINGS VASCULAR Aorta: Normal caliber aorta without  aneurysm, dissection, vasculitis or significant stenosis. Scattered atherosclerotic calcifications most prominent in the infrarenal segment. Celiac: Moderate focal proximal stenosis with poststenotic ectasia of the celiac trunk measuring up to approximately 1.0 cm. Interval development of chronic appearing occlusion of the proximal splenic artery with reconstitution via pancreatic collaterals. 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 SMA: Patent without evidence of aneurysm, dissection, vasculitis or significant stenosis. Renals: Single bilateral renal arteries are patent without evidence of aneurysm, dissection, vasculitis, fibromuscular dysplasia or significant stenosis. IMA: Patent without evidence of aneurysm, dissection, vasculitis or significant stenosis. Inflow:  Moderate focal proximal stenosis of the left common iliac artery secondary to fibrofatty and calcific atherosclerotic plaque. Patent distally. The right inflow vessels are widely patent. Veins: No obvious venous abnormality within the limitations of this arterial phase study. Review of the MIP images confirms the above findings. NON-VASCULAR Hepatobiliary: No focal liver abnormality is seen. No gallstones, gallbladder wall thickening, or biliary dilatation. Pancreas: Unremarkable. No pancreatic ductal dilatation or surrounding inflammatory changes. Spleen: Normal in size without focal abnormality. Adrenals/Urinary Tract: Adrenal glands are unremarkable. Kidneys are normal, without renal calculi, focal lesion, or hydronephrosis. Bladder is unremarkable. Stomach/Bowel: Stomach is within normal limits. Appendix appears normal. Descending and sigmoid colonic diverticula without surrounding inflammatory changes. No evidence of bowel wall thickening, distention, or inflammatory changes. Lymphatic: No abdominopelvic lymphadenopathy. Reproductive: Status post hysterectomy. No adnexal masses. Other: No abdominal wall hernia or abnormality. No abdominopelvic ascites. Musculoskeletal: No acute or significant osseous findings. IMPRESSION: VASCULAR 1. 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 The superior and inferior mesenteric arteries are widely patent. 2. 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 3. Coronary and aortic atherosclerosis (ICD10-I70.0). NON-VASCULAR 1. Diverticulosis. 2. No acute or significant intrathoracic abnormality. Ruthann Cancer, MD [facility] Electronically Signed    By: Ruthann Cancer M.D.   On: 07/18/2022 09:26   PERIPHERAL VASCULAR CATHETERIZATION  Result Date: 07/17/2022 See surgical note for result.   Assessment & Plan:  .Helicobacter pylori gastritis Assessment & Plan: Untreated due to intolerance of regimen.  Had EGD at [facility] , biopsies done .  No H pylori  found    Gastric acid decreased  Nausea -     Metoclopramide HCl; Take 1 tablet (10 mg total) by mouth daily. 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     Acute gastritis without hemorrhage, unspecified gastritis type Assessment & Plan: No improvement with PPI . EGD negative during recent [facility] hospitalization .  MALS with critical celliac artery stenosis noted    Lumbar stenosis without neurogenic claudication Assessment & Plan: Secondary to known spinal stenosis.  She has been advised to avoid NSAIDs .  Refill history confirmed via [place] Controlled Substance databas, accessed by me today.. refills for hydrocodone given   Other orders -     HYDROcodone-Acetaminophen; Take 1 tablet by mouth 2 (two) times daily as needed for moderate pain.  Dispense: 60 tablet; Refill: 0     I provided 40 minutes of face-to-face time during this  encounter reviewing patient's last visit with me, patient's  most recent visit with vascular surgery,    recent surgical and non surgical procedures, previous  labs and imaging studies, counseling on currently addressed issues,  and post visit ordering to diagnostics and therapeutics .   Follow-up: No follow-ups on file.   Crecencio Mc, MD

## 2022-08-01 NOTE — Assessment & Plan Note (Signed)
Diagnosed during [facility] admission l with weight loss and postprandial pain  supportiv eof need for surgical intervention .

## 2022-08-01 NOTE — Assessment & Plan Note (Addendum)
Untreated due to intolerance of regimen.  Had EGD at [facility] , biopsies done .  No H pylori  found

## 2022-08-01 NOTE — Assessment & Plan Note (Signed)
No improvement with PPI . EGD negative during recent [facility] hospitalization .  MALS with critical celliac artery stenosis noted

## 2022-08-02 ENCOUNTER — Telehealth: Payer: Self-pay | Admitting: Internal Medicine

## 2022-08-02 DIAGNOSIS — I774 Celiac artery compression syndrome: Secondary | ICD-10-CM | POA: Diagnosis not present

## 2022-08-02 DIAGNOSIS — Z01818 Encounter for other preprocedural examination: Secondary | ICD-10-CM | POA: Diagnosis not present

## 2022-08-02 NOTE — Telephone Encounter (Signed)
Copied from [place] [phone]. Topic: Medicare AWV >> Aug 02, 2022  3:15 PM Lollie Marrow wrote: Reason for CRM: Called patient to schedule Medicare Annual Wellness Visit (AWV). Left message for patient to call back and schedule Medicare Annual Wellness Visit (AWV).  Last date of AWV: 07/04/2021  Please schedule an appointment at any time with Denisa, [place].  If any questions, please contact me at [phone].    Thank you,  [place] Direct dial  [phone]

## 2022-08-06 ENCOUNTER — Other Ambulatory Visit: Payer: Self-pay

## 2022-08-06 ENCOUNTER — Encounter: Payer: Self-pay | Admitting: Internal Medicine

## 2022-08-06 MED ORDER — VENLAFAXINE HCL ER 75 MG PO CP24: ORAL_CAPSULE | ORAL | 3 refills | Status: DC

## 2022-08-13 NOTE — Telephone Encounter (Signed)
Pt reports taking rosuvastatin 10mg  but we see that it was d/c'd when she was in the hospital.  Do you want this restarted?

## 2022-08-14 MED ORDER — ROSUVASTATIN CALCIUM 10 MG PO TABS: 10.0000 mg | ORAL_TABLET | Freq: Every day | ORAL | 1 refills | Status: DC

## 2022-08-14 NOTE — Addendum Note (Signed)
Addended by: Crecencio Mc on: 08/14/2022 09:05 AM   Modules accepted: Orders

## 2022-08-15 ENCOUNTER — Other Ambulatory Visit: Payer: Self-pay | Admitting: Internal Medicine

## 2022-08-15 MED ORDER — HYDROCODONE-ACETAMINOPHEN 5-325 MG PO TABS: 1.0000 | ORAL_TABLET | Freq: Two times a day (BID) | ORAL | 0 refills | Status: DC | PRN

## 2022-08-15 NOTE — Telephone Encounter (Signed)
Called and pt is aware.  She reported that she has received a text from Julian that the medication is ready.

## 2022-08-17 ENCOUNTER — Other Ambulatory Visit: Payer: Self-pay | Admitting: Internal Medicine

## 2022-08-29 DIAGNOSIS — N959 Unspecified menopausal and perimenopausal disorder: Secondary | ICD-10-CM | POA: Diagnosis not present

## 2022-09-06 DIAGNOSIS — I774 Celiac artery compression syndrome: Secondary | ICD-10-CM | POA: Diagnosis not present

## 2022-09-11 DIAGNOSIS — D561 Beta thalassemia: Secondary | ICD-10-CM | POA: Diagnosis not present

## 2022-09-11 DIAGNOSIS — R768 Other specified abnormal immunological findings in serum: Secondary | ICD-10-CM | POA: Diagnosis not present

## 2022-09-11 DIAGNOSIS — K567 Ileus, unspecified: Secondary | ICD-10-CM | POA: Diagnosis not present

## 2022-09-11 DIAGNOSIS — F419 Anxiety disorder, unspecified: Secondary | ICD-10-CM | POA: Diagnosis not present

## 2022-09-11 DIAGNOSIS — J449 Chronic obstructive pulmonary disease, unspecified: Secondary | ICD-10-CM | POA: Diagnosis not present

## 2022-09-11 DIAGNOSIS — R079 Chest pain, unspecified: Secondary | ICD-10-CM | POA: Diagnosis not present

## 2022-09-11 DIAGNOSIS — I774 Celiac artery compression syndrome: Secondary | ICD-10-CM | POA: Diagnosis not present

## 2022-09-11 DIAGNOSIS — D571 Sickle-cell disease without crisis: Secondary | ICD-10-CM | POA: Diagnosis not present

## 2022-09-11 DIAGNOSIS — Z716 Tobacco abuse counseling: Secondary | ICD-10-CM | POA: Diagnosis not present

## 2022-09-11 DIAGNOSIS — I447 Left bundle-branch block, unspecified: Secondary | ICD-10-CM | POA: Diagnosis not present

## 2022-09-11 DIAGNOSIS — Z6827 Body mass index (BMI) 27.0-27.9, adult: Secondary | ICD-10-CM | POA: Diagnosis not present

## 2022-09-11 DIAGNOSIS — F17210 Nicotine dependence, cigarettes, uncomplicated: Secondary | ICD-10-CM | POA: Diagnosis not present

## 2022-09-11 DIAGNOSIS — R9431 Abnormal electrocardiogram [ECG] [EKG]: Secondary | ICD-10-CM | POA: Diagnosis not present

## 2022-09-11 DIAGNOSIS — I2510 Atherosclerotic heart disease of native coronary artery without angina pectoris: Secondary | ICD-10-CM | POA: Diagnosis not present

## 2022-09-11 DIAGNOSIS — Z888 Allergy status to other drugs, medicaments and biological substances status: Secondary | ICD-10-CM | POA: Diagnosis not present

## 2022-09-11 DIAGNOSIS — E785 Hyperlipidemia, unspecified: Secondary | ICD-10-CM | POA: Diagnosis not present

## 2022-09-11 DIAGNOSIS — E440 Moderate protein-calorie malnutrition: Secondary | ICD-10-CM | POA: Diagnosis not present

## 2022-09-11 DIAGNOSIS — Z880 Allergy status to penicillin: Secondary | ICD-10-CM | POA: Diagnosis not present

## 2022-09-11 DIAGNOSIS — Z882 Allergy status to sulfonamides status: Secondary | ICD-10-CM | POA: Diagnosis not present

## 2022-09-11 DIAGNOSIS — R109 Unspecified abdominal pain: Secondary | ICD-10-CM | POA: Diagnosis not present

## 2022-09-11 DIAGNOSIS — G8929 Other chronic pain: Secondary | ICD-10-CM | POA: Diagnosis not present

## 2022-09-11 DIAGNOSIS — G8918 Other acute postprocedural pain: Secondary | ICD-10-CM | POA: Diagnosis not present

## 2022-09-21 ENCOUNTER — Telehealth: Payer: Self-pay | Admitting: *Deleted

## 2022-09-21 NOTE — Transitions of Care (Post Inpatient/ED Visit) (Signed)
   09/21/2022  Name: Cheryl [name] MRN: [id number] DOB: 21-Dec-1955  Today's TOC FU Call Status: Today's TOC FU Call Status:: Successful TOC FU Call Competed TOC FU Call Complete Date: 09/21/22  Transition Care Management Follow-up Telephone Call Date of Discharge: 09/20/22 Discharge Facility: Other ([facility] Facility) Name of Other ([facility]) Discharge Facility: [facility] Type of Discharge: Inpatient Admission Primary Inpatient Discharge Diagnosis:: Median arcuate ligament syndrome How have you been since you were released from the hospital?: Better (weak) Any questions or concerns?: No  Items Reviewed: Did you receive and understand the discharge instructions provided?: Yes Medications obtained and verified?: Yes (Medications Reviewed) Dietary orders reviewed?: No Do you have support at home?: Yes People in Home: spouse Name of Support/Comfort Primary Source: [facility] and Equipment/Supplies: Were Home Health Services Ordered?: No Any new equipment or medical supplies ordered?: No  Functional Questionnaire: Do you need assistance with bathing/showering or dressing?: Yes Do you need assistance with meal preparation?: Yes Do you have difficulty maintaining continence: No Do you need assistance with getting out of bed/getting out of a chair/moving?: No Do you have difficulty managing or taking your medications?: No  Follow up appointments reviewed: PCP Follow-up appointment confirmed?: NA Specialist Hospital Follow-up appointment confirmed?: Yes Date of Specialist follow-up appointment?: 09/27/22 Follow-Up Specialty Provider:: [id number] Dr Katrinka Blazing 2:30, [id number] Cardiologist 8:00. Do you need transportation to your follow-up appointment?: No Do you understand care options if your condition(s) worsen?: Yes-patient verbalized understanding  SDOH Interventions Today    Flowsheet Row Most Recent Value  SDOH Interventions   Food Insecurity Interventions Intervention Not  Indicated  Housing Interventions Intervention Not Indicated  Transportation Interventions Intervention Not Indicated      Interventions Today    Flowsheet Row Most Recent Value  General Interventions   General Interventions Discussed/Reviewed General Interventions Discussed, General Interventions Reviewed, Doctor Visits  Doctor Visits Discussed/Reviewed Doctor Visits Discussed, Doctor Visits Reviewed  Pharmacy Interventions   Pharmacy Dicussed/Reviewed Pharmacy Topics Discussed, Pharmacy Topics Reviewed      TOC Interventions Today    Flowsheet Row Most Recent Value  TOC Interventions   TOC Interventions Discussed/Reviewed TOC Interventions Discussed, TOC Interventions Reviewed       Gean Maidens BSN RN [facility] [phone]

## 2022-09-27 ENCOUNTER — Ambulatory Visit: Payer: PPO | Admitting: Family Medicine

## 2022-09-29 ENCOUNTER — Other Ambulatory Visit: Payer: Self-pay | Admitting: Internal Medicine

## 2022-10-03 ENCOUNTER — Telehealth: Payer: Self-pay | Admitting: Internal Medicine

## 2022-10-03 NOTE — Telephone Encounter (Signed)
Contacted Cheryl [name] to schedule their annual wellness visit. Appointment made for 10/15/2022.  Verlee Rossetti; Care Guide Ambulatory Clinical Support [place] [facility] Direct Dial: [phone]

## 2022-10-05 DIAGNOSIS — N951 Menopausal and female climacteric states: Secondary | ICD-10-CM | POA: Diagnosis not present

## 2022-10-05 DIAGNOSIS — Z79890 Hormone replacement therapy (postmenopausal): Secondary | ICD-10-CM | POA: Diagnosis not present

## 2022-10-05 DIAGNOSIS — R232 Flushing: Secondary | ICD-10-CM | POA: Diagnosis not present

## 2022-10-05 DIAGNOSIS — N183 Chronic kidney disease, stage 3 unspecified: Secondary | ICD-10-CM | POA: Diagnosis not present

## 2022-10-05 DIAGNOSIS — R5383 Other fatigue: Secondary | ICD-10-CM | POA: Diagnosis not present

## 2022-10-05 DIAGNOSIS — Z6823 Body mass index (BMI) 23.0-23.9, adult: Secondary | ICD-10-CM | POA: Diagnosis not present

## 2022-10-09 DIAGNOSIS — Z01812 Encounter for preprocedural laboratory examination: Secondary | ICD-10-CM | POA: Diagnosis not present

## 2022-10-09 DIAGNOSIS — I774 Celiac artery compression syndrome: Secondary | ICD-10-CM | POA: Diagnosis not present

## 2022-10-12 ENCOUNTER — Encounter: Payer: Self-pay | Admitting: Internal Medicine

## 2022-10-14 NOTE — Progress Notes (Unsigned)
I connected with  Cheryl [name] on 10/15/2022 by a audio enabled telemedicine application and verified that I am speaking with the correct person using two identifiers.  Patient Location: Home  Provider Location: Home Office  I discussed the limitations of evaluation and management by telemedicine. The patient expressed understanding and agreed to proceed.   Subjective:   Cheryl [name] is a 67 y.o. female who presents for Medicare Annual (Subsequent) preventive examination.  Review of Systems    Per HPI unless specifically indicated below.  Cardiac Risk Factors include: advanced age (>82men, >70 women);female gender, Renal hypertension, and Hyperlipidemia.           Objective:       08/01/2022    1:49 PM 08/01/2022    1:47 PM 07/31/2022   10:32 AM  Vitals with BMI  Height  5\' 3"  5\' 3"   Weight  151 lbs 3 oz 150 lbs  BMI  26.79 26.58  Systolic 111 142 102  Diastolic 66 68 60  Pulse  74 72    There were no vitals filed for this visit. There is no height or weight on file to calculate BMI.     10/15/2022   10:15 AM 07/17/2022    5:21 PM 07/17/2022    1:34 PM 01/11/2022   11:01 AM 07/04/2021    1:24 PM 01/11/2021    1:00 PM 01/29/2019   10:41 AM  Advanced Directives  Does Patient Have a Medical Advance Directive? Yes  Yes Yes Yes No No  Type of Estate agent of [place];Living will Healthcare Power of [employer] Power of Attorney Living will;Healthcare Power of [employer] Power of [place];Living will    Does patient want to make changes to medical advance directive? No - Guardian declined No - Patient declined   No - Patient declined No - Patient declined   Copy of Healthcare Power of Attorney in Chart?     No - copy requested    Would patient like information on creating a medical advance directive?      No - Patient declined No - Patient declined    Current Medications (verified) Outpatient Encounter Medications as of  10/15/2022  Medication Sig   albuterol (PROAIR HFA) 108 (90 Base) MCG/ACT inhaler Inhale 1-2 puffs into the lungs every 6 (six) hours as needed for wheezing or shortness of breath.   buPROPion (WELLBUTRIN) 75 MG tablet Take 1 tablet by mouth twice daily   Calcium Carbonate-Vit D-Min (CALCIUM 1200 PO) Take 1 tablet by mouth daily.   Cholecalciferol (VITAMIN D) 125 MCG (5000 UT) CAPS Take by mouth.   docusate sodium (COLACE) 100 MG capsule Take 100 mg by mouth 2 (two) times daily.   gabapentin (NEURONTIN) 300 MG capsule Take 1 capsule (300 mg total) by mouth 3 (three) times daily.   HYDROcodone-acetaminophen (NORCO/VICODIN) 5-325 MG tablet Take 1 tablet by mouth 2 (two) times daily as needed for moderate pain.   HYDROcodone-acetaminophen (NORCO/VICODIN) 5-325 MG tablet Take 1 tablet by mouth 2 (two) times daily as needed for moderate pain.   metoCLOPramide (REGLAN) 10 MG tablet Take 1 tablet (10 mg total) by mouth daily. 15 minutes  before your biggest meal   metoprolol succinate (TOPROL-XL) 25 MG 24 hr tablet Take 1 tablet (25 mg total) by mouth daily.   nitroGLYCERIN (NITROSTAT) 0.4 MG SL tablet DISSOLVE 1 TABLET UNDER THE TONGUE EVERY 5 MINUTES AS  NEEDED FOR CHEST PAIN. MAX  OF 3 TABLETS  IN 15 MINUTES. CALL 911 IF PAIN PERSISTS.   Prasterone, DHEA, 10 MG CAPS every other day.   progesterone (PROMETRIUM) 200 MG capsule Take 200 mg by mouth at bedtime.   rOPINIRole (REQUIP) 1 MG tablet TAKE 1 TABLET BY MOUTH THREE TIMES DAILY   rosuvastatin (CRESTOR) 10 MG tablet Take 1 tablet (10 mg total) by mouth daily.   venlafaxine XR (EFFEXOR-XR) 75 MG 24 hr capsule TAKE 1 CAPSULE(75 MG) BY MOUTH DAILY WITH BREAKFAST   vitamin C (ASCORBIC ACID) 500 MG tablet Take 500 mg by mouth daily.   aspirin EC 81 MG tablet Take 1 tablet (81 mg total) by mouth daily. (Patient not taking: Reported on 10/15/2022)   No facility-administered encounter medications on file as of 10/15/2022.    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   Arrhythmia    left bundle branch block   Arthritis    Blood in stool    Chicken pox    Generalized headaches    History of blood transfusion    Kidney disease, chronic, stage III (GFR 30-59 ml/min) (HCC)    LBBB (left bundle branch block)    Renal disorder    Thyroid disease    UTI (lower urinary tract infection)    Past Surgical History:  Procedure Laterality Date   ABDOMINAL HYSTERECTOMY  1997   APPENDECTOMY  1981   BREAST EXCISIONAL BIOPSY Bilateral 1976   neg   BREAST SURGERY  1976   CARDIAC CATHETERIZATION  2004   [facility]   CARDIAC CATHETERIZATION  2006   [facility]   cystic fibrosis tumor removal  1983   THUMB AMPUTATION  1992   traumatic   TONSILLECTOMY AND ADENOIDECTOMY  1964   VISCERAL ANGIOGRAPHY N/A 06/29/2022   Procedure: VISCERAL ANGIOGRAPHY;  Surgeon: Renford Dills, MD;  Location: [facility] INVASIVE CV LAB;  Service: Cardiovascular;  Laterality: N/A;   VISCERAL ANGIOGRAPHY N/A 07/17/2022   Procedure: VISCERAL ANGIOGRAPHY;  Surgeon: Renford Dills, MD;  Location: [facility] INVASIVE CV LAB;  Service: Cardiovascular;  Laterality: N/A;   Family History  Problem Relation Age of Onset   Heart disease Mother    Arthritis Mother    Cancer Mother        breast   Hyperlipidemia Mother    Hypertension Mother    Heart attack Mother    Breast cancer Mother 57   Heart disease Father    Diabetes Father    Arthritis Father    Hypertension Father    Parkinson's disease Father    Hodgkin's lymphoma Father        hodgkins disease, prostate   Heart disease Sister    Diabetes Sister     Diabetes Maternal Aunt    Heart disease Maternal Grandmother    Diabetes Maternal Grandmother    Heart disease Maternal Grandfather    Heart disease Paternal Grandmother    Diabetes Paternal Grandmother    Stroke Paternal Grandfather    Heart disease Paternal Grandfather    Diabetes Paternal Grandfather    Breast cancer Cousin        2 pat cousins   Heart disease Brother 26       ami x  8,  4 vessel CABG    Diabetes Brother    Liver disease Brother    Lung disease Brother    Social History   Socioeconomic History   Marital status: Married    Spouse name: Not on file   Number of children: 3   Years of education: Not on file   Highest education level: Not on file  Occupational History   Occupation: Retired  Tobacco Use   Smoking status: Every Day    Packs/day: 0.25    Years: 34.00    Additional pack years: 0.00    Total pack years: 8.50    Types: Cigarettes    Last attempt to quit: 04/10/2013    Years since quitting: 9.5   Smokeless tobacco: Never  Vaping Use   Vaping Use: Never used  Substance and Sexual Activity   Alcohol use: Yes    Comment: occasional   Drug use: No   Sexual activity: Not on file  Other Topics Concern   Not on file  Social History Narrative   Married to [employer]; Works for [employer], Engineer, structural. Quit smoking 2018; ocassional alcohol; lives near to [place].    Social Determinants of Health   Financial Resource Strain: Low Risk  (10/15/2022)   Overall Financial Resource Strain (CARDIA)    Difficulty of Paying Living Expenses: Not hard at all  Food Insecurity: No Food Insecurity (10/15/2022)   Hunger Vital Sign    Worried About Running Out of Food in the Last Year: Never true    Ran Out of Food in the Last Year: Never true  Transportation Needs: No Transportation Needs (10/15/2022)   PRAPARE - Administrator, Civil Service (Medical): No    Lack of Transportation (Non-Medical): No  Physical Activity: Inactive (10/15/2022)    Exercise Vital Sign    Days of Exercise per Week: 0 days    Minutes of Exercise per Session: 0 min  Stress: No Stress Concern Present (10/15/2022)   [employer] of Occupational Health - Occupational Stress Questionnaire    Feeling of Stress : Not at all  Social Connections: Moderately Integrated (10/15/2022)   Social Connection and Isolation Panel [NHANES]    Frequency of Communication with Friends and Family: More than three times a week    Frequency of Social Gatherings with Friends and Family: Three times a week    Attends Religious Services: Never    Active Member of Clubs or Organizations: Yes    Attends Engineer, structural: More than 4 times per year    Marital Status: Married    Tobacco Counseling Ready to quit: Not Answered Counseling given: No   Clinical Intake:  Pre-visit preparation completed: No  Pain : No/denies pain     Nutritional Status: BMI of 19-24  Normal Nutritional Risks: Nausea/ vomitting/ diarrhea, Unintentional weight loss (unitentional weight loss since major surgery. Pt report losing 20lbs) Diabetes: No  How often do you need to have someone help you when you read instructions, pamphlets, or other written materials from your doctor or pharmacy?: 1 - Never  Diabetic?No   Interpreter Needed?: No  Information entered by :: Laurel Dimmer, CMA   Activities of Daily Living    10/15/2022   10:09 AM 07/17/2022    5:21 PM  In your present state of health, do you have any difficulty performing the following activities:  Hearing? 1 1  Comment deaf left ear, tinnutis   Vision? 1 0  Comment Dr. Clearance Coots ,  [facility]   Difficulty concentrating or making decisions? 0 0  Walking or climbing stairs? 0 0  Dressing or bathing? 0 0  Doing errands, shopping? 0 0    Patient Care Team: Sherlene Shams, MD as PCP - General (Internal Medicine) Lemar Livings Merrily Pew, MD as Consulting Physician (General Surgery)  Indicate any recent  Medical Services you may have received from other than [facility] providers in the past year (date may be approximate).     Assessment:   This is a routine wellness examination for Tyauna. Hearing/Vision screen Hearing loss in Left ear.  Denies any change to her vision. Wear glasses. Annual Eye Exam.   Dietary issues and exercise activities discussed: Current Exercise Habits: The patient does not participate in regular exercise at present, Exercise limited by: Other - see comments (Pt has not excerise since major abdominal surgery x 1 mth ago)   Goals Addressed   None    Depression Screen    10/15/2022   10:08 AM 10/15/2022   10:07 AM 08/01/2022    1:53 PM 05/02/2022    3:16 PM 01/31/2022    1:49 PM 10/31/2021    3:17 PM 08/01/2021    1:34 PM  PHQ 2/9 Scores  PHQ - 2 Score 0 0 0 0 0 0 0  PHQ- 9 Score   4        Fall Risk    10/15/2022   10:08 AM 08/01/2022    1:53 PM 05/02/2022    3:16 PM 01/31/2022    1:49 PM 10/31/2021    3:17 PM  Fall Risk   Falls in the past year? 1 0 0 0 0  Number falls in past yr: 0 0     Injury with Fall? 0 0     Risk for fall due to : Impaired balance/gait No Fall Risks No Fall Risks No Fall Risks No Fall Risks  Follow up Falls evaluation completed Falls evaluation completed Falls evaluation completed Falls evaluation completed Falls evaluation completed    FALL RISK PREVENTION PERTAINING TO THE HOME:  Any stairs in or around the home? No  If so, are there any without handrails? No  Home free of loose throw rugs in walkways, pet beds, electrical cords, etc? Yes  Adequate lighting in your home to reduce risk of falls? Yes   ASSISTIVE DEVICES UTILIZED TO PREVENT FALLS:  Life alert? No  Use of a cane, walker or w/c? No  Grab bars in the bathroom? Yes  Shower chair or bench in shower? Yes  Elevated toilet seat or a handicapped toilet? Yes   TIMED UP AND GO:  Was the test performed? Unable to perform, virtual appointment   Cognitive Function:         10/15/2022   10:12 AM  6CIT Screen  What Year? 0 points  What month? 0 points  What time? 0 points  Count back from 20 0 points  Months in reverse 0 points  Repeat phrase 0 points  Total Score 0 points    Immunizations Immunization History  Administered Date(s) Administered   Pneumococcal Polysaccharide-23 07/27/2014   Tdap 03/19/2011, 07/26/2020    TDAP status: Up to date  Flu Vaccine status: Up to date  Pneumococcal vaccine status: Due, Education has been provided regarding the importance of this vaccine. Advised may receive this vaccine at local pharmacy or Health Dept. Aware to provide a copy of the vaccination record if obtained from local pharmacy or Health Dept. Verbalized  acceptance and understanding.  Covid-19 vaccine status: Information provided on how to obtain vaccines.   Qualifies for Shingles Vaccine? Yes   Zostavax completed No   Shingrix Completed?: No.    Education has been provided regarding the importance of this vaccine. Patient has been advised to call insurance company to determine out of pocket expense if they have not yet received this vaccine. Advised may also receive vaccine at local pharmacy or Health Dept. Verbalized acceptance and understanding.  Screening Tests Health Maintenance  Topic Date Due   Hepatitis C Screening  Never done   MAMMOGRAM  08/08/2022   COLONOSCOPY (Pts 45-68yrs Insurance coverage will need to be confirmed)  11/20/2022   INFLUENZA VACCINE  12/27/2022   Medicare Annual Wellness (AWV)  10/15/2023   DTaP/Tdap/Td (3 - Td or Tdap) 07/27/2030   DEXA SCAN  Completed   HPV VACCINES  Aged Out   Pneumonia Vaccine 76+ Years old  Discontinued   COVID-19 Vaccine  Discontinued   Zoster Vaccines- Shingrix  Discontinued    Health Maintenance  Health Maintenance Due  Topic Date Due   Hepatitis C Screening  Never done   MAMMOGRAM  08/08/2022    Colorectal cancer screening: Type of screening: Colonoscopy. Completed 11/19/2012.  Repeat every 10 years  Mammogram status: Ordered 10/15/2022. Pt provided with contact info and advised to call to schedule appt.   DEXA Scan: 08/07/2021  Lung Cancer Screening: (Low Dose CT Chest recommended if Age 20-80 years, 30 pack-year currently smoking OR have quit w/in 15years.) does not qualify.   Lung Cancer Screening Referral: not applicable   Additional Screening:  Hepatitis C Screening: does qualify; overdue  Vision Screening: Recommended annual ophthalmology exams for early detection of glaucoma and other disorders of the eye. Is the patient up to date with their annual eye exam?  Yes  Who is the provider or what is the name of the office in which the patient attends annual eye exams? Dr. Clearance Coots If pt is not established with a provider, would they like to be referred to a provider to establish care? No .   Dental Screening: Recommended annual dental exams for proper oral hygiene  Community Resource Referral / Chronic Care Management: CRR required this visit?  No   CCM required this visit?  No      Plan:     I have personally reviewed and noted the following in the patient's chart:   Medical and social history Use of alcohol, tobacco or illicit drugs  Current medications and supplements including opioid prescriptions. Patient is not currently taking opioid prescriptions. Functional ability and status Nutritional status Physical activity Advanced directives List of other physicians Hospitalizations, surgeries, and ER visits in previous 12 months Vitals Screenings to include cognitive, depression, and falls Referrals and appointments  In addition, I have reviewed and discussed with patient certain preventive protocols, quality metrics, and best practice recommendations. A written personalized care plan for preventive services as well as general preventive health recommendations were provided to patient.    Ms. [name] , Thank you for taking time to come for  your Medicare Wellness Visit. I appreciate your ongoing commitment to your health goals. Please review the following plan we discussed and let me know if I can assist you in the future.   These are the goals we discussed:  Goals      Quit Smoking     Decrease amount of tobacco use until able to stop  This is a list of the screening recommended for you and due dates:  Health Maintenance  Topic Date Due   Hepatitis C Screening: USPSTF Recommendation to screen - Ages 39-79 yo.  Never done   Mammogram  08/08/2022   Colon Cancer Screening  11/20/2022   Flu Shot  12/27/2022   Medicare Annual Wellness Visit  10/15/2023   DTaP/Tdap/Td vaccine (3 - Td or Tdap) 07/27/2030   DEXA scan (bone density measurement)  Completed   HPV Vaccine  Aged Out   Pneumonia Vaccine  Discontinued   COVID-19 Vaccine  Discontinued   Zoster (Shingles) Vaccine  Discontinued     Lonna Cobb, CMA   10/15/2022   Nurse Notes: Approximately 30 minute Non-Face -To-Face Medicare Wellness Visit     I have reviewed the above information and agree with above.   Duncan Dull, MD

## 2022-10-14 NOTE — Patient Instructions (Signed)

## 2022-10-15 ENCOUNTER — Ambulatory Visit (INDEPENDENT_AMBULATORY_CARE_PROVIDER_SITE_OTHER): Payer: PPO

## 2022-10-15 DIAGNOSIS — Z0000 Encounter for general adult medical examination without abnormal findings: Secondary | ICD-10-CM

## 2022-10-15 DIAGNOSIS — Z1231 Encounter for screening mammogram for malignant neoplasm of breast: Secondary | ICD-10-CM | POA: Diagnosis not present

## 2022-10-16 DIAGNOSIS — E785 Hyperlipidemia, unspecified: Secondary | ICD-10-CM | POA: Diagnosis not present

## 2022-10-16 DIAGNOSIS — N12 Tubulo-interstitial nephritis, not specified as acute or chronic: Secondary | ICD-10-CM | POA: Diagnosis not present

## 2022-10-16 DIAGNOSIS — N390 Urinary tract infection, site not specified: Secondary | ICD-10-CM | POA: Diagnosis not present

## 2022-10-16 DIAGNOSIS — F419 Anxiety disorder, unspecified: Secondary | ICD-10-CM | POA: Diagnosis not present

## 2022-10-16 DIAGNOSIS — N189 Chronic kidney disease, unspecified: Secondary | ICD-10-CM | POA: Diagnosis not present

## 2022-10-16 DIAGNOSIS — J449 Chronic obstructive pulmonary disease, unspecified: Secondary | ICD-10-CM | POA: Diagnosis not present

## 2022-10-16 DIAGNOSIS — I129 Hypertensive chronic kidney disease with stage 1 through stage 4 chronic kidney disease, or unspecified chronic kidney disease: Secondary | ICD-10-CM | POA: Diagnosis not present

## 2022-10-16 DIAGNOSIS — Z880 Allergy status to penicillin: Secondary | ICD-10-CM | POA: Diagnosis not present

## 2022-10-16 DIAGNOSIS — Z79899 Other long term (current) drug therapy: Secondary | ICD-10-CM | POA: Diagnosis not present

## 2022-10-16 DIAGNOSIS — I2510 Atherosclerotic heart disease of native coronary artery without angina pectoris: Secondary | ICD-10-CM | POA: Diagnosis not present

## 2022-10-16 DIAGNOSIS — N3000 Acute cystitis without hematuria: Secondary | ICD-10-CM | POA: Diagnosis not present

## 2022-10-16 DIAGNOSIS — R55 Syncope and collapse: Secondary | ICD-10-CM | POA: Diagnosis not present

## 2022-10-16 DIAGNOSIS — F17210 Nicotine dependence, cigarettes, uncomplicated: Secondary | ICD-10-CM | POA: Diagnosis not present

## 2022-10-16 DIAGNOSIS — R42 Dizziness and giddiness: Secondary | ICD-10-CM | POA: Diagnosis not present

## 2022-10-16 DIAGNOSIS — R112 Nausea with vomiting, unspecified: Secondary | ICD-10-CM | POA: Diagnosis not present

## 2022-10-17 DIAGNOSIS — N390 Urinary tract infection, site not specified: Secondary | ICD-10-CM | POA: Diagnosis not present

## 2022-10-17 DIAGNOSIS — E860 Dehydration: Secondary | ICD-10-CM | POA: Diagnosis not present

## 2022-11-02 ENCOUNTER — Ambulatory Visit: Payer: PPO | Admitting: Internal Medicine

## 2022-11-12 ENCOUNTER — Encounter: Payer: Self-pay | Admitting: Internal Medicine

## 2022-11-12 ENCOUNTER — Ambulatory Visit (INDEPENDENT_AMBULATORY_CARE_PROVIDER_SITE_OTHER): Payer: PPO | Admitting: Internal Medicine

## 2022-11-12 VITALS — BP 126/72 | HR 82 | Ht 63.0 in | Wt 132.6 lb

## 2022-11-12 DIAGNOSIS — N182 Chronic kidney disease, stage 2 (mild): Secondary | ICD-10-CM | POA: Diagnosis not present

## 2022-11-12 DIAGNOSIS — M545 Low back pain, unspecified: Secondary | ICD-10-CM

## 2022-11-12 DIAGNOSIS — I771 Stricture of artery: Secondary | ICD-10-CM

## 2022-11-12 DIAGNOSIS — E782 Mixed hyperlipidemia: Secondary | ICD-10-CM | POA: Diagnosis not present

## 2022-11-12 DIAGNOSIS — G8929 Other chronic pain: Secondary | ICD-10-CM

## 2022-11-12 DIAGNOSIS — D508 Other iron deficiency anemias: Secondary | ICD-10-CM | POA: Diagnosis not present

## 2022-11-12 DIAGNOSIS — Z1211 Encounter for screening for malignant neoplasm of colon: Secondary | ICD-10-CM | POA: Diagnosis not present

## 2022-11-12 DIAGNOSIS — E559 Vitamin D deficiency, unspecified: Secondary | ICD-10-CM | POA: Diagnosis not present

## 2022-11-12 DIAGNOSIS — Z720 Tobacco use: Secondary | ICD-10-CM

## 2022-11-12 DIAGNOSIS — Z122 Encounter for screening for malignant neoplasm of respiratory organs: Secondary | ICD-10-CM | POA: Diagnosis not present

## 2022-11-12 DIAGNOSIS — I700 Atherosclerosis of aorta: Secondary | ICD-10-CM | POA: Diagnosis not present

## 2022-11-12 DIAGNOSIS — Z1231 Encounter for screening mammogram for malignant neoplasm of breast: Secondary | ICD-10-CM

## 2022-11-12 MED ORDER — HYDROCODONE-ACETAMINOPHEN 5-325 MG PO TABS: 1.0000 | ORAL_TABLET | Freq: Two times a day (BID) | ORAL | 0 refills | Status: DC | PRN

## 2022-11-12 MED ORDER — ROSUVASTATIN CALCIUM 10 MG PO TABS: 10.0000 mg | ORAL_TABLET | Freq: Every day | ORAL | 3 refills | Status: DC

## 2022-11-12 NOTE — Patient Instructions (Addendum)
Keep the premier protein shakes chilled for the protein   Ok to eat ice cream.   Goal is 60  grams  or more of protein    Quitting smoking is  THE MOST IMPORTANT HEALTH DECISION YOU CAN MAKE .  PLAN IT OUT!  PLAN FOR SUCCESS, NOT RELAPSE

## 2022-11-12 NOTE — Progress Notes (Unsigned)
Subjective:  Patient ID: Cheryl [name], female    DOB: 1956/03/31  Age: 67 y.o. MRN: [id number]  CC: The primary encounter diagnosis was Colon cancer screening. Diagnoses of Encounter for screening mammogram for malignant neoplasm of breast, Chronic renal impairment, stage 2 (mild), Mixed hyperlipidemia, Other iron deficiency anemia, Vitamin D deficiency, Aortic atherosclerosis (HCC), Celiac artery stenosis (HCC), Chronic low back pain without sciatica, unspecified back pain laterality, Tobacco abuse, and Screening for lung cancer were also pertinent to this visit.   HPI Cheryl [name] presents for  Chief Complaint  Patient presents with   Medical Management of Chronic Issues    Medication refill   1)  PAD:  Cheryl [name] is S/p MAL release surgery done at [facility] April 16 and recover for relief of celiac artery stenosis.  We reviewed her hospital stay and her post surgical recovery.  She has mixed reviews of her "ordeal"    Her morphine pca was abruptly stopped. However review of records notes that  she was given scheduled oxycodone .  Her discharge was delayed by the development of post operative  ileus which resulted in severe abdominal distension  ,  on POD5 .  Discharged POD 7. With rx for oxycdone 5 m  qty #10.  No refills have been given    She returned to ER after about 10 days post discharge due to a witnessed syncopal event and was treated or dehydration and asymptomatic  UTI.   no urine culture was done.  Moving bowels with a daily stool softener (2 daily ) drinking at least 48 ounces of water daily , and gatorade  had a few episodes 1 of dyspnea and dizziness post     2) Weight loss:  Has lost 25 lbs unintentionally.  "I'm just not hungry"  Gastroparesis study was borderline positive pre surgery .  Currently having fewer , less frequent episodes of vomiting.  Bowels are moving very slowly .  Told to eat 6 small meals daily .    3) loss of muscle tone.  She  Declines PT<  has [facility]  [employer]    . Needs colonoscopy .  4) Snoring resolved with weight loss.  Cervical spine issues less problematic   5) Chronic Pain:  she has resumed use of hydrocodone following her surgery for management of musculoskeletal pain .    5) Tobacco abuse:  Still smoking 10 packs per month (1/3 pack daily )  .  No prior lung CA screening      Outpatient Medications Prior to Visit  Medication Sig Dispense Refill   albuterol (PROAIR HFA) 108 (90 Base) MCG/ACT inhaler Inhale 1-2 puffs into the lungs every 6 (six) hours as needed for wheezing or shortness of breath. 8 g 2   aspirin EC 81 MG tablet Take 1 tablet (81 mg total) by mouth daily.     buPROPion (WELLBUTRIN) 75 MG tablet Take 1 tablet by mouth twice daily 180 tablet 1   Calcium Carbonate-Vit D-Min (CALCIUM 1200 PO) Take 1 tablet by mouth daily.     Cholecalciferol (VITAMIN D) 125 MCG (5000 UT) CAPS Take by mouth.     docusate sodium (COLACE) 100 MG capsule Take 100 mg by mouth 2 (two) times daily.     gabapentin (NEURONTIN) 300 MG capsule Take 1 capsule (300 mg total) by mouth 3 (three) times daily. 270 capsule 3   HYDROcodone-acetaminophen (NORCO/VICODIN) 5-325 MG tablet Take 1 tablet by mouth 2 (two) times daily as needed  for moderate pain. 60 tablet 0   metoCLOPramide (REGLAN) 10 MG tablet Take 1 tablet (10 mg total) by mouth daily. 15 minutes  before your biggest meal 30 tablet 2   metoprolol succinate (TOPROL-XL) 25 MG 24 hr tablet Take 1 tablet (25 mg total) by mouth daily. 90 tablet 3   nitroGLYCERIN (NITROSTAT) 0.4 MG SL tablet DISSOLVE 1 TABLET UNDER THE TONGUE EVERY 5 MINUTES AS  NEEDED FOR CHEST PAIN. MAX  OF 3 TABLETS IN 15 MINUTES. CALL 911 IF PAIN PERSISTS. 100 tablet 3   pantoprazole (PROTONIX) 40 MG tablet Take 40 mg by mouth daily.     Prasterone, DHEA, 10 MG CAPS every other day.     prochlorperazine (COMPAZINE) 5 MG tablet Take 5 mg by mouth every 6 (six) hours as needed.     progesterone (PROMETRIUM) 200 MG  capsule Take 200 mg by mouth at bedtime.     rOPINIRole (REQUIP) 1 MG tablet TAKE 1 TABLET BY MOUTH THREE TIMES DAILY 90 tablet 3   venlafaxine XR (EFFEXOR-XR) 75 MG 24 hr capsule TAKE 1 CAPSULE(75 MG) BY MOUTH DAILY WITH BREAKFAST 90 capsule 3   vitamin C (ASCORBIC ACID) 500 MG tablet Take 500 mg by mouth daily.     HYDROcodone-acetaminophen (NORCO/VICODIN) 5-325 MG tablet Take 1 tablet by mouth 2 (two) times daily as needed for moderate pain. 60 tablet 0   rosuvastatin (CRESTOR) 10 MG tablet Take 1 tablet (10 mg total) by mouth daily. 90 tablet 1   No facility-administered medications prior to visit.    Review of Systems;  Patient denies headache, fevers, malaise, unintentional weight loss, skin rash, eye pain, sinus congestion and sinus pain, sore throat, dysphagia,  hemoptysis , cough, dyspnea, wheezing, chest pain, palpitations, orthopnea, edema, abdominal pain, nausea, melena, diarrhea, constipation, flank pain, dysuria, hematuria, urinary  Frequency, nocturia, numbness, tingling, seizures,  Focal weakness, Loss of consciousness,  Tremor, insomnia, depression, anxiety, and suicidal ideation.      Objective:  BP 126/72   Pulse 82   Ht 5\' 3"  (1.6 m)   Wt 132 lb 9.6 oz (60.1 kg)   SpO2 97%   BMI 23.49 kg/m   BP Readings from Last 3 Encounters:  11/12/22 126/72  08/01/22 111/66  07/31/22 118/60    Wt Readings from Last 3 Encounters:  11/12/22 132 lb 9.6 oz (60.1 kg)  08/01/22 151 lb 3.2 oz (68.6 kg)  07/31/22 150 lb (68 kg)    Physical Exam Vitals reviewed.  Constitutional:      General: She is not in acute distress.    Appearance: Normal appearance. She is normal weight. She is not ill-appearing, toxic-appearing or diaphoretic.  HENT:     Head: Normocephalic.  Eyes:     General: No scleral icterus.       Right eye: No discharge.        Left eye: No discharge.     Conjunctiva/sclera: Conjunctivae normal.  Cardiovascular:     Rate and Rhythm: Normal rate and regular  rhythm.     Heart sounds: Normal heart sounds.  Pulmonary:     Effort: Respiratory distress present.     Breath sounds: Normal breath sounds.  Abdominal:     General: Abdomen is flat. Bowel sounds are normal.       Comments: Well healed midline vertical incision without hernia   Musculoskeletal:        General: Normal range of motion.  Skin:    General: Skin is warm and  dry.  Neurological:     General: No focal deficit present.     Mental Status: She is alert and oriented to person, place, and time. Mental status is at baseline.  Psychiatric:        Mood and Affect: Mood normal.        Behavior: Behavior normal.        Thought Content: Thought content normal.        Judgment: Judgment normal.    Lab Results  Component Value Date   HGBA1C 5.5 05/02/2022   HGBA1C 5.1 10/26/2015   HGBA1C 5.4 11/25/2013    Lab Results  Component Value Date   CREATININE 0.88 07/18/2022   CREATININE 0.92 07/17/2022   CREATININE 0.89 06/29/2022    Lab Results  Component Value Date   WBC 8.3 07/21/2022   HGB 9.1 (L) 07/21/2022   HCT 29.3 (L) 07/21/2022   PLT 148 (L) 07/21/2022   GLUCOSE 100 (H) 07/18/2022   CHOL 108 05/02/2022   TRIG 273.0 (H) 05/02/2022   HDL 29.10 (L) 05/02/2022   LDLDIRECT 58.0 05/02/2022   LDLCALC 44 08/01/2021   ALT 10 05/02/2022   AST 11 05/02/2022   NA 137 07/18/2022   K 3.8 07/18/2022   CL 106 07/18/2022   CREATININE 0.88 07/18/2022   BUN 14 07/18/2022   CO2 25 07/18/2022   TSH 2.20 05/02/2022   INR 1.1 07/17/2022   HGBA1C 5.5 05/02/2022   MICROALBUR 1.1 05/02/2022    CT Angio Chest/Abd/Pel for Dissection W and/or W/WO  Result Date: 07/18/2022 CLINICAL DATA:  48 female with history of chronic mesenteric ischemia. EXAM: CT ANGIOGRAPHY CHEST, ABDOMEN AND PELVIS TECHNIQUE: Multidetector CT imaging through the chest, abdomen and pelvis was performed using the standard protocol during bolus administration of intravenous contrast. Multiplanar  reconstructed images and MIPs were obtained and reviewed to evaluate the vascular anatomy. RADIATION DOSE REDUCTION: This exam was performed according to the departmental dose-optimization program which includes automated exposure control, adjustment of the mA and/or kV according to patient size and/or use of iterative reconstruction technique. CONTRAST:  OMNIPAQUE IOHEXOL 350 MG/ML SOLN COMPARISON:  CTA chest from 01/30/2019 CTA abdomen 08/17/2015 FINDINGS: CTA CHEST FINDINGS VASCULAR Preferential opacification of the thoracic aorta. No evidence of thoracic aortic aneurysm or dissection. Scattered atherosclerotic calcifications about the aortic arch and descending thoracic aorta. Normal heart size. No pericardial effusion. Sinues of Valsalva: 27 mm 28 x 27 mm ,unchanged Sinotubular Junction: 26 mm ,unchanged Ascending Aorta: 33 mm ,unchanged Aortic Arch: 32 mm ,unchanged Descending aorta: 25 mm at the level of the carina ,unchanged Branch vessels: Conventional branching pattern. Scattered atherosclerotic changes without evidence of significant ostial stenosis. Coronary arteries: Normal origins and courses. Severe atherosclerotic calcifications about the proximal anterior descending and circumflex coronary arteries. Main pulmonary artery: 21 mm ,unchanged. No evidence of central pulmonary embolism. Pulmonary veins: No anomalous pulmonary venous return. No evidence of left atrial appendage thrombus. NON VASCULAR Mediastinum/Nodes: No enlarged mediastinal, hilar, or axillary lymph nodes. Thyroid gland, trachea, and esophagus demonstrate no significant findings. Lungs/Pleura: No focal consolidations. No suspicious pulmonary nodules. No pleural effusion or pneumothorax. Musculoskeletal: No chest wall abnormality. No acute or significant osseous findings. Review of the MIP images confirms the above findings. CTA ABDOMEN AND PELVIS FINDINGS VASCULAR Aorta: Normal caliber aorta without aneurysm, dissection,  vasculitis or significant stenosis. Scattered atherosclerotic calcifications most prominent in the infrarenal segment. Celiac: Moderate focal proximal stenosis with poststenotic ectasia of the celiac trunk measuring up to approximately 1.0  cm. Interval development of chronic appearing occlusion of the proximal splenic artery with reconstitution via pancreatic collaterals. 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 SMA: Patent without evidence of aneurysm, dissection, vasculitis or significant stenosis. Renals: Single bilateral renal arteries are patent without evidence of aneurysm, dissection, vasculitis, fibromuscular dysplasia or significant stenosis. IMA: Patent without evidence of aneurysm, dissection, vasculitis or significant stenosis. Inflow: Moderate focal proximal stenosis of the left common iliac artery secondary to fibrofatty and calcific atherosclerotic plaque. Patent distally. The right inflow vessels are widely patent. Veins: No obvious venous abnormality within the limitations of this arterial phase study. Review of the MIP images confirms the above findings. NON-VASCULAR Hepatobiliary: No focal liver abnormality is seen. No gallstones, gallbladder wall thickening, or biliary dilatation. Pancreas: Unremarkable. No pancreatic ductal dilatation or surrounding inflammatory changes. Spleen: Normal in size without focal abnormality. Adrenals/Urinary Tract: Adrenal glands are unremarkable. Kidneys are normal, without renal calculi, focal lesion, or hydronephrosis. Bladder is unremarkable. Stomach/Bowel: Stomach is within normal limits. Appendix appears normal. Descending and sigmoid colonic diverticula without surrounding inflammatory changes. No evidence of bowel wall thickening, distention, or inflammatory changes. Lymphatic: No abdominopelvic lymphadenopathy. Reproductive: Status post hysterectomy.  No adnexal masses. Other: No abdominal wall hernia or abnormality. No abdominopelvic ascites. Musculoskeletal: No acute or significant osseous findings. IMPRESSION: VASCULAR 1. 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 The superior and inferior mesenteric arteries are widely patent. 2. 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 3. Coronary and aortic atherosclerosis (ICD10-I70.0). NON-VASCULAR 1. Diverticulosis. 2. No acute or significant intrathoracic abnormality. Marliss Coots, MD [facility] Radiology Electronically Signed   By: Marliss Coots M.D.   On: 07/18/2022 09:26   PERIPHERAL VASCULAR CATHETERIZATION  Result Date: 07/17/2022 See surgical note for result.   Assessment & Plan:  .Colon cancer screening -     Ambulatory referral to Gastroenterology  Encounter for screening mammogram for malignant neoplasm of breast -     3D Screening Mammogram, Left and Right; Future  Chronic renal impairment, stage 2 (mild)  Mixed hyperlipidemia -     Comprehensive metabolic panel -     TSH -     Lipid panel  Other iron deficiency anemia -     CBC with Differential/Platelet  Vitamin D deficiency -     VITAMIN D 25 Hydroxy (Vit-D Deficiency, Fractures)  Aortic atherosclerosis (HCC) Assessment & Plan:  Patient is tolerating high potency statin therapy  but continues to smoke despite her PAD.  She is motivated to quit    Celiac artery stenosis [facility]) Assessment & Plan: S.p MALS surgery AT [facility] April 2024. Still smoking    Chronic low back pain without sciatica, unspecified back pain laterality Assessment & Plan: Secondary to lumbar spinal stenosis.  Her pain requires  use of chronic opiate therapy; NSAIDS are contraindicated due to CKD .  Refill history confirmed via [place] Controlled Substance databas, accessed by me today... refills given   Tobacco abuse Assessment & Plan: Still smoking .  5 minutes was spent in face to face time discussing patient's current tobacco abuse,  Prior attempts to quit, the current and future hazards to patient's  health,  And assessing and discussing patient's  level of motivation  to quit. Patient was encouraged to consider pharmacotherapy.    Orders: -     Ambulatory Referral for Lung Cancer Scre  Screening for lung cancer -     Ambulatory Referral for Lung Cancer Scre  Other orders -     Rosuvastatin Calcium; Take 1 tablet (10 mg total) by mouth daily.  Dispense: 90 tablet; Refill: 3 -     HYDROcodone-Acetaminophen; Take 1 tablet by mouth 2 (two) times daily as needed for moderate pain.  Dispense: 60 tablet; Refill: 0     I provided 30 minutes of face-to-face time during this encounter reviewing patient's last visit with me, patient's  most recent visit with vascular surgery for post operative follow up,   recent hospitalization at [facility] in April for MALS , previous  labs and imaging studies, counseling on currently addressed issues,  and post visit ordering to diagnostics and therapeutics .   Follow-up: Return in about 3 months (around 02/12/2023) for chronic pain management.   Sherlene Shams, MD

## 2022-11-13 ENCOUNTER — Encounter: Payer: Self-pay | Admitting: Internal Medicine

## 2022-11-13 LAB — COMPREHENSIVE METABOLIC PANEL
ALT: 15 U/L (ref 0–35)
AST: 14 U/L (ref 0–37)
Albumin: 4.3 g/dL (ref 3.5–5.2)
Alkaline Phosphatase: 78 U/L (ref 39–117)
BUN: 10 mg/dL (ref 6–23)
CO2: 24 mEq/L (ref 19–32)
Calcium: 8.9 mg/dL (ref 8.4–10.5)
Chloride: 107 mEq/L (ref 96–112)
Creatinine, Ser: 0.87 mg/dL (ref 0.40–1.20)
GFR: 68.97 mL/min (ref >60.00)
Glucose, Bld: 88 mg/dL (ref 70–99)
Potassium: 4.2 mEq/L (ref 3.5–5.1)
Sodium: 139 mEq/L (ref 135–145)
Total Bilirubin: 1 mg/dL (ref 0.2–1.2)
Total Protein: 6.4 g/dL (ref 6.0–8.3)

## 2022-11-13 LAB — LIPID PANEL
Cholesterol: 89 mg/dL (ref 0–200)
HDL: 31.6 mg/dL — ABNORMAL LOW (ref >39.00)
LDL Cholesterol: 41 mg/dL (ref 0–99)
NonHDL: 57.12
Total CHOL/HDL Ratio: 3
Triglycerides: 83 mg/dL (ref 0.0–149.0)
VLDL: 16.6 mg/dL (ref 0.0–40.0)

## 2022-11-13 LAB — CBC WITH DIFFERENTIAL/PLATELET
Basophils Absolute: 0.1 10*3/uL (ref 0.0–0.1)
Basophils Relative: 1.2 % (ref 0.0–3.0)
Eosinophils Absolute: 0.2 10*3/uL (ref 0.0–0.7)
Eosinophils Relative: 2 % (ref 0.0–5.0)
HCT: 29.5 % — ABNORMAL LOW (ref 36.0–46.0)
Hemoglobin: 9.1 g/dL — ABNORMAL LOW (ref 12.0–15.0)
Lymphocytes Relative: 26.8 % (ref 12.0–46.0)
Lymphs Abs: 2.4 10*3/uL (ref 0.7–4.0)
MCHC: 30.9 g/dL (ref 30.0–36.0)
MCV: 69.5 fl — ABNORMAL LOW (ref 78.0–100.0)
Monocytes Absolute: 0.7 10*3/uL (ref 0.1–1.0)
Monocytes Relative: 7.4 % (ref 3.0–12.0)
Neutro Abs: 5.6 10*3/uL (ref 1.4–7.7)
Neutrophils Relative %: 62.6 % (ref 43.0–77.0)
Platelets: 277 10*3/uL (ref 150.0–400.0)
RBC: 4.24 Mil/uL (ref 3.87–5.11)
RDW: 21.2 % — ABNORMAL HIGH (ref 11.5–15.5)
WBC: 8.9 10*3/uL (ref 4.0–10.5)

## 2022-11-13 LAB — TSH: TSH: 0.76 u[IU]/mL (ref 0.35–5.50)

## 2022-11-13 LAB — VITAMIN D 25 HYDROXY (VIT D DEFICIENCY, FRACTURES): VITD: 73.78 ng/mL (ref 30.00–100.00)

## 2022-11-13 NOTE — Assessment & Plan Note (Signed)
S.p MALS surgery AT [facility] April 2024. Still smoking

## 2022-11-13 NOTE — Assessment & Plan Note (Addendum)
Secondary to lumbar spinal stenosis.  Her pain requires use of chronic opiate therapy; NSAIDS are contraindicated due to CKD .  Refill history confirmed via [place] Controlled Substance databas, accessed by me today... refills given

## 2022-11-13 NOTE — Assessment & Plan Note (Signed)
Patient is tolerating high potency statin therapy  but continues to smoke despite her PAD.  She is motivated to quit

## 2022-11-13 NOTE — Assessment & Plan Note (Signed)
?  Still smoking .  5 minutes was spent in face to face time discussing patient's current tobacco abuse,  Prior attempts to quit, the current and future hazards to patient's  health,  And assessing and discussing patient's  level of motivation to quit. Patient was encouraged to consider pharmacotherapy.  ?

## 2022-11-15 ENCOUNTER — Telehealth: Payer: Self-pay

## 2022-11-15 NOTE — Telephone Encounter (Signed)
LMTCB in regards to lab results.  

## 2022-11-15 NOTE — Telephone Encounter (Signed)
Pt is aware of lab results and gave a verbal understanding.

## 2022-11-15 NOTE — Telephone Encounter (Signed)
-----   Message from Eulis Foster, FNP sent at 11/15/2022  9:51 AM EDT ----- Labs stable.

## 2022-11-15 NOTE — Telephone Encounter (Signed)
Pt returned [place] call. Unable to transfer.

## 2022-11-22 ENCOUNTER — Encounter: Payer: Self-pay | Admitting: *Deleted

## 2022-11-27 DIAGNOSIS — F17210 Nicotine dependence, cigarettes, uncomplicated: Secondary | ICD-10-CM | POA: Diagnosis not present

## 2022-11-27 DIAGNOSIS — I774 Celiac artery compression syndrome: Secondary | ICD-10-CM | POA: Diagnosis not present

## 2022-11-27 DIAGNOSIS — E785 Hyperlipidemia, unspecified: Secondary | ICD-10-CM | POA: Diagnosis not present

## 2022-12-06 DIAGNOSIS — K3184 Gastroparesis: Secondary | ICD-10-CM | POA: Diagnosis not present

## 2022-12-06 DIAGNOSIS — R109 Unspecified abdominal pain: Secondary | ICD-10-CM | POA: Diagnosis not present

## 2022-12-06 DIAGNOSIS — I774 Celiac artery compression syndrome: Secondary | ICD-10-CM | POA: Diagnosis not present

## 2022-12-08 ENCOUNTER — Other Ambulatory Visit: Payer: Self-pay | Admitting: Internal Medicine

## 2022-12-08 NOTE — Progress Notes (Deleted)
MRN : [id number]  Cheryl [name] is a 67 y.o. (10-30-1955) female who presents with chief complaint of check circulation.  History of Present Illness:   The patient returns to the office for followup and review status post angiogram with intervention on 07/17/2022.   Procedure:  Selective injection of the celiac artery first-order catheter placement.  Left brachial artery cutdown for arterial access  Unfortunately was never able to advance a wire out of the aneurysm and to successfully select the splenic or the common hepatic. At this point not being able to obtain distal purchase I elected to terminate the case. Wire and sheath were removed.   There have been no significant changes to the patient's overall health care.  No documented history of amaurosis fugax or recent TIA symptoms. There are no recent neurological changes noted. No documented history of DVT, PE or superficial thrombophlebitis. The patient denies recent episodes of angina or shortness of breath.   Duplex US of the *** lower extremity arterial system shows ***  No outpatient medications have been marked as taking for the 12/10/22 encounter (Appointment) with Gilda Crease, Latina Craver, MD.    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   Arrhythmia    left bundle branch block   Arthritis    Blood in stool    Chicken pox    Generalized headaches    History of blood transfusion    Kidney disease, chronic, stage III (GFR 30-59 ml/min) (HCC)    LBBB (left bundle branch block)    Renal disorder    Thyroid disease    UTI (lower urinary tract infection)     Past Surgical History:  Procedure Laterality Date   ABDOMINAL HYSTERECTOMY  1997    APPENDECTOMY  1981   BREAST EXCISIONAL BIOPSY Bilateral 1976   neg   BREAST SURGERY  1976   CARDIAC CATHETERIZATION  2004   [facility]   CARDIAC CATHETERIZATION  2006   [facility]   cystic fibrosis tumor removal  1983   THUMB AMPUTATION  1992   traumatic   TONSILLECTOMY AND ADENOIDECTOMY  1964   VISCERAL ANGIOGRAPHY N/A 06/29/2022   Procedure: VISCERAL ANGIOGRAPHY;  Surgeon: Renford Dills, MD;  Location: [facility] INVASIVE CV LAB;  Service: Cardiovascular;  Laterality: N/A;   VISCERAL ANGIOGRAPHY N/A 07/17/2022   Procedure: VISCERAL ANGIOGRAPHY;  Surgeon: Renford Dills, MD;  Location: [facility] INVASIVE CV LAB;  Service: Cardiovascular;  Laterality: N/A;    Social History Social History   Tobacco Use   Smoking status: Every Day    Current packs/day: 0.00    Average packs/day: 0.3 packs/day for 34.0 years (8.5 ttl pk-yrs)    Types: Cigarettes    Start date: 04/11/1979    Last attempt to quit: 04/10/2013    Years since quitting: 9.6   Smokeless tobacco: Never  Vaping Use   Vaping status: Never Used  Substance Use Topics  Alcohol use: Yes    Comment: occasional   Drug use: No    Family History Family History  Problem Relation Age of Onset   Heart disease Mother    Arthritis Mother    Cancer Mother        breast   Hyperlipidemia Mother    Hypertension Mother    Heart attack Mother    Breast cancer Mother 38   Heart disease Father    Diabetes Father    Arthritis Father    Hypertension Father    Parkinson's disease Father    Hodgkin's lymphoma Father        hodgkins disease, prostate   Heart disease Sister    Diabetes Sister    Diabetes Maternal Aunt    Heart disease Maternal Grandmother    Diabetes Maternal Grandmother    Heart disease Maternal Grandfather    Heart disease Paternal Grandmother    Diabetes Paternal Grandmother    Stroke Paternal Grandfather    Heart disease Paternal Grandfather    Diabetes Paternal Grandfather    Breast cancer Cousin        2 pat  cousins   Heart disease Brother 70       ami x 8,  4 vessel CABG    Diabetes Brother    Liver disease Brother    Lung disease Brother     Allergies  Allergen Reactions   Peanuts [Peanut Oil]    Penicillins Other (See Comments)    Hives,rash,nausea,swelling,    Sulfa Antibiotics Other (See Comments)    Hives,rash,nausea,swelling   Corticosteroids     Other Reaction(s): RASH, SWELLING   Influenza Vac Split [Influenza Virus Vaccine]     Pleurisy  1996   Mobic [Meloxicam] Nausea Only    Renal insufficiency   Nickel    Nsaids Other (See Comments)    Decreased gfr    Penicillin G     Other Reaction(s): RASH , SWELLING   Prednisone Rash     REVIEW OF SYSTEMS (Negative unless checked)  Constitutional: [] Weight loss  [] Fever  [] Chills Cardiac: [] Chest pain   [] Chest pressure   [] Palpitations   [] Shortness of breath when laying flat   [] Shortness of breath with exertion. Vascular:  [x] Pain in legs with walking   [] Pain in legs at rest  [] History of DVT   [] Phlebitis   [] Swelling in legs   [] Varicose veins   [] Non-healing ulcers Pulmonary:   [] Uses home oxygen   [] Productive cough   [] Hemoptysis   [] Wheeze  [] COPD   [] Asthma Neurologic:  [] Dizziness   [] Seizures   [] History of stroke   [] History of TIA  [] Aphasia   [] Vissual changes   [] Weakness or numbness in arm   [] Weakness or numbness in leg Musculoskeletal:   [] Joint swelling   [] Joint pain   [] Low back pain Hematologic:  [] Easy bruising  [] Easy bleeding   [] Hypercoagulable state   [] Anemic Gastrointestinal:  [] Diarrhea   [] Vomiting  [] Gastroesophageal reflux/heartburn   [] Difficulty swallowing. Genitourinary:  [] Chronic kidney disease   [] Difficult urination  [] Frequent urination   [] Blood in urine Skin:  [] Rashes   [] Ulcers  Psychological:  [] History of anxiety   []  History of major depression.  Physical Examination  There were no vitals filed for this visit. There is no height or weight on file to calculate BMI. Gen:  WD/WN, NAD Head: [place]/AT, No temporalis wasting.  Ear/Nose/Throat: Hearing grossly intact, nares w/o erythema or drainage Eyes: PER, EOMI, sclera nonicteric.  Neck:  Supple, no masses.  No bruit or JVD.  Pulmonary:  Good air movement, no audible wheezing, no use of accessory muscles.  Cardiac: RRR, normal S1, S2, no Murmurs. Vascular:  mild trophic changes, no open wounds Vessel Right Left  Radial Palpable Palpable  PT Not Palpable Not Palpable  DP Not Palpable Not Palpable  Gastrointestinal: soft, non-distended. No guarding/no peritoneal signs.  Musculoskeletal: M/S 5/5 throughout.  No visible deformity.  Neurologic: CN 2-12 intact. Pain and light touch intact in extremities.  Symmetrical.  Speech is fluent. Motor exam as listed above. Psychiatric: Judgment intact, Mood & affect appropriate for pt's clinical situation. Dermatologic: No rashes or ulcers noted.  No changes consistent with cellulitis.   CBC Lab Results  Component Value Date   WBC 8.9 11/12/2022   HGB 9.1 (L) 11/12/2022   HCT 29.5 (L) 11/12/2022   MCV 69.5 Repeated and verified X2. (L) 11/12/2022   PLT 277.0 11/12/2022    BMET    Component Value Date/Time   NA 139 11/12/2022 1503   NA 139 05/23/2020 1147   K 4.2 11/12/2022 1503   CL 107 11/12/2022 1503   CO2 24 11/12/2022 1503   GLUCOSE 88 11/12/2022 1503   BUN 10 11/12/2022 1503   BUN 11 05/23/2020 1147   CREATININE 0.87 11/12/2022 1503   CREATININE 0.95 09/01/2014 1659   CREATININE 0.80 08/15/2012 1609   CALCIUM 8.9 11/12/2022 1503   CALCIUM 9.7 09/01/2014 1659   GFRNONAA >60 07/18/2022 0215   GFRNONAA >60 09/01/2014 1659   GFRAA 67 05/23/2020 1147   GFRAA >60 09/01/2014 1659   CrCl cannot be calculated (Patient's most recent lab result is older than the maximum 21 days allowed.).  COAG Lab Results  Component Value Date   INR 1.1 07/17/2022   INR 1.0 09/01/2014    Radiology No results found.   Assessment/Plan There are no diagnoses linked  to this encounter.   Levora Dredge, MD  12/08/2022 2:59 PM

## 2022-12-10 ENCOUNTER — Encounter (INDEPENDENT_AMBULATORY_CARE_PROVIDER_SITE_OTHER): Payer: PPO

## 2022-12-10 ENCOUNTER — Ambulatory Visit (INDEPENDENT_AMBULATORY_CARE_PROVIDER_SITE_OTHER): Payer: PPO | Admitting: Vascular Surgery

## 2022-12-10 DIAGNOSIS — J439 Emphysema, unspecified: Secondary | ICD-10-CM

## 2022-12-10 DIAGNOSIS — I728 Aneurysm of other specified arteries: Secondary | ICD-10-CM

## 2022-12-10 DIAGNOSIS — E782 Mixed hyperlipidemia: Secondary | ICD-10-CM

## 2022-12-10 DIAGNOSIS — I6523 Occlusion and stenosis of bilateral carotid arteries: Secondary | ICD-10-CM

## 2022-12-10 DIAGNOSIS — I739 Peripheral vascular disease, unspecified: Secondary | ICD-10-CM

## 2022-12-13 ENCOUNTER — Telehealth: Payer: Self-pay | Admitting: Internal Medicine

## 2022-12-13 MED ORDER — HYDROCODONE-ACETAMINOPHEN 5-325 MG PO TABS: 1.0000 | ORAL_TABLET | Freq: Two times a day (BID) | ORAL | 0 refills | Status: DC | PRN

## 2022-12-13 NOTE — Telephone Encounter (Signed)
Prescription Request  12/13/2022  LOV: 11/12/2022  What is the name of the medication or equipment? HYDROcodone   Have you contacted your pharmacy to request a refill? No   Which pharmacy would you like this sent to?  [facility] [address], [place] - [address] [place] [address] Phone: [phone] Fax: [phone]    Patient notified that their request is being sent to the clinical staff for review and that they should receive a response within 2 business days.   Please advise at Mobile [phone] (mobile)

## 2022-12-13 NOTE — Telephone Encounter (Signed)
Requesting: Hydrocodone Contract: No UDS: No Last Visit: 11/12/2022 Next Visit: 02/12/2023 Last Refill: 11/12/2022  Please Advise

## 2022-12-13 NOTE — Telephone Encounter (Signed)
Refills for July and August sent

## 2022-12-14 NOTE — Telephone Encounter (Signed)
Pt is aware.  

## 2022-12-27 DIAGNOSIS — R109 Unspecified abdominal pain: Secondary | ICD-10-CM | POA: Diagnosis not present

## 2022-12-27 DIAGNOSIS — K3184 Gastroparesis: Secondary | ICD-10-CM | POA: Diagnosis not present

## 2022-12-27 DIAGNOSIS — R634 Abnormal weight loss: Secondary | ICD-10-CM | POA: Diagnosis not present

## 2022-12-27 DIAGNOSIS — R110 Nausea: Secondary | ICD-10-CM | POA: Diagnosis not present

## 2022-12-27 DIAGNOSIS — R198 Other specified symptoms and signs involving the digestive system and abdomen: Secondary | ICD-10-CM | POA: Diagnosis not present

## 2022-12-30 ENCOUNTER — Other Ambulatory Visit: Payer: Self-pay | Admitting: Internal Medicine

## 2022-12-30 DIAGNOSIS — M48061 Spinal stenosis, lumbar region without neurogenic claudication: Secondary | ICD-10-CM

## 2023-01-03 DIAGNOSIS — R194 Change in bowel habit: Secondary | ICD-10-CM | POA: Diagnosis not present

## 2023-01-03 DIAGNOSIS — I2510 Atherosclerotic heart disease of native coronary artery without angina pectoris: Secondary | ICD-10-CM | POA: Diagnosis not present

## 2023-01-03 DIAGNOSIS — Z6822 Body mass index (BMI) 22.0-22.9, adult: Secondary | ICD-10-CM | POA: Diagnosis not present

## 2023-01-03 DIAGNOSIS — N189 Chronic kidney disease, unspecified: Secondary | ICD-10-CM | POA: Diagnosis not present

## 2023-01-03 DIAGNOSIS — K5730 Diverticulosis of large intestine without perforation or abscess without bleeding: Secondary | ICD-10-CM | POA: Diagnosis not present

## 2023-01-03 DIAGNOSIS — K6289 Other specified diseases of anus and rectum: Secondary | ICD-10-CM | POA: Diagnosis not present

## 2023-01-03 DIAGNOSIS — R634 Abnormal weight loss: Secondary | ICD-10-CM | POA: Diagnosis not present

## 2023-01-03 DIAGNOSIS — K6282 Dysplasia of anus: Secondary | ICD-10-CM | POA: Diagnosis not present

## 2023-01-03 DIAGNOSIS — K56691 Other complete intestinal obstruction: Secondary | ICD-10-CM | POA: Diagnosis not present

## 2023-01-03 DIAGNOSIS — E785 Hyperlipidemia, unspecified: Secondary | ICD-10-CM | POA: Diagnosis not present

## 2023-01-03 DIAGNOSIS — J449 Chronic obstructive pulmonary disease, unspecified: Secondary | ICD-10-CM | POA: Diagnosis not present

## 2023-01-08 DIAGNOSIS — K6282 Dysplasia of anus: Secondary | ICD-10-CM | POA: Diagnosis not present

## 2023-01-10 ENCOUNTER — Encounter (INDEPENDENT_AMBULATORY_CARE_PROVIDER_SITE_OTHER): Payer: Self-pay

## 2023-01-10 DIAGNOSIS — N951 Menopausal and female climacteric states: Secondary | ICD-10-CM | POA: Diagnosis not present

## 2023-01-10 DIAGNOSIS — R232 Flushing: Secondary | ICD-10-CM | POA: Diagnosis not present

## 2023-01-11 ENCOUNTER — Inpatient Hospital Stay: Payer: PPO | Attending: Internal Medicine

## 2023-01-11 DIAGNOSIS — E538 Deficiency of other specified B group vitamins: Secondary | ICD-10-CM | POA: Insufficient documentation

## 2023-01-11 DIAGNOSIS — J449 Chronic obstructive pulmonary disease, unspecified: Secondary | ICD-10-CM | POA: Diagnosis not present

## 2023-01-11 DIAGNOSIS — D563 Thalassemia minor: Secondary | ICD-10-CM | POA: Insufficient documentation

## 2023-01-11 DIAGNOSIS — F17200 Nicotine dependence, unspecified, uncomplicated: Secondary | ICD-10-CM | POA: Diagnosis not present

## 2023-01-11 DIAGNOSIS — Z6822 Body mass index (BMI) 22.0-22.9, adult: Secondary | ICD-10-CM | POA: Diagnosis not present

## 2023-01-11 DIAGNOSIS — D508 Other iron deficiency anemias: Secondary | ICD-10-CM | POA: Insufficient documentation

## 2023-01-11 DIAGNOSIS — I774 Celiac artery compression syndrome: Secondary | ICD-10-CM | POA: Diagnosis not present

## 2023-01-11 LAB — FERRITIN: Ferritin: 187 ng/mL (ref 11–307)

## 2023-01-11 LAB — CBC WITH DIFFERENTIAL/PLATELET
Abs Immature Granulocytes: 0.02 10*3/uL (ref 0.00–0.07)
Basophils Absolute: 0 10*3/uL (ref 0.0–0.1)
Basophils Relative: 1 %
Eosinophils Absolute: 0.2 10*3/uL (ref 0.0–0.5)
Eosinophils Relative: 2 %
HCT: 26.6 % — ABNORMAL LOW (ref 36.0–46.0)
Hemoglobin: 8.4 g/dL — ABNORMAL LOW (ref 12.0–15.0)
Immature Granulocytes: 0 %
Lymphocytes Relative: 28 %
Lymphs Abs: 2.3 10*3/uL (ref 0.7–4.0)
MCH: 21.9 pg — ABNORMAL LOW (ref 26.0–34.0)
MCHC: 31.6 g/dL (ref 30.0–36.0)
MCV: 69.5 fL — ABNORMAL LOW (ref 80.0–100.0)
Monocytes Absolute: 0.6 10*3/uL (ref 0.1–1.0)
Monocytes Relative: 7 %
Neutro Abs: 5.1 10*3/uL (ref 1.7–7.7)
Neutrophils Relative %: 62 %
Platelets: 212 10*3/uL (ref 150–400)
RBC: 3.83 MIL/uL — ABNORMAL LOW (ref 3.87–5.11)
RDW: 19.2 % — ABNORMAL HIGH (ref 11.5–15.5)
WBC: 8.2 10*3/uL (ref 4.0–10.5)
nRBC: 1 % — ABNORMAL HIGH (ref 0.0–0.2)

## 2023-01-11 LAB — BASIC METABOLIC PANEL
Anion gap: 6 (ref 5–15)
BUN: 13 mg/dL (ref 8–23)
CO2: 23 mmol/L (ref 22–32)
Calcium: 8.7 mg/dL — ABNORMAL LOW (ref 8.9–10.3)
Chloride: 108 mmol/L (ref 98–111)
Creatinine, Ser: 0.81 mg/dL (ref 0.44–1.00)
GFR, Estimated: >60 mL/min (ref >60)
Glucose, Bld: 121 mg/dL — ABNORMAL HIGH (ref 70–99)
Potassium: 4.3 mmol/L (ref 3.5–5.1)
Sodium: 137 mmol/L (ref 135–145)

## 2023-01-11 LAB — IRON AND TIBC
Iron: 94 ug/dL (ref 28–170)
Saturation Ratios: 33 % — ABNORMAL HIGH (ref 10.4–31.8)
TIBC: 284 ug/dL (ref 250–450)
UIBC: 190 ug/dL

## 2023-01-11 LAB — VITAMIN B12: Vitamin B-12: 282 pg/mL (ref 180–914)

## 2023-01-16 ENCOUNTER — Other Ambulatory Visit: Payer: Self-pay | Admitting: Oncology

## 2023-01-16 DIAGNOSIS — Z006 Encounter for examination for normal comparison and control in clinical research program: Secondary | ICD-10-CM

## 2023-01-18 ENCOUNTER — Inpatient Hospital Stay: Payer: PPO | Admitting: Medical Oncology

## 2023-01-18 ENCOUNTER — Inpatient Hospital Stay: Payer: PPO

## 2023-01-18 ENCOUNTER — Other Ambulatory Visit: Payer: PPO

## 2023-01-18 ENCOUNTER — Encounter: Payer: Self-pay | Admitting: Medical Oncology

## 2023-01-18 VITALS — BP 113/74 | HR 62 | Temp 96.0°F | Resp 20 | Ht 63.0 in | Wt 131.0 lb

## 2023-01-18 DIAGNOSIS — D508 Other iron deficiency anemias: Secondary | ICD-10-CM | POA: Diagnosis not present

## 2023-01-18 DIAGNOSIS — D563 Thalassemia minor: Secondary | ICD-10-CM

## 2023-01-18 DIAGNOSIS — M2550 Pain in unspecified joint: Secondary | ICD-10-CM | POA: Diagnosis not present

## 2023-01-18 DIAGNOSIS — N951 Menopausal and female climacteric states: Secondary | ICD-10-CM | POA: Diagnosis not present

## 2023-01-18 DIAGNOSIS — R232 Flushing: Secondary | ICD-10-CM | POA: Diagnosis not present

## 2023-01-18 DIAGNOSIS — Z6822 Body mass index (BMI) 22.0-22.9, adult: Secondary | ICD-10-CM | POA: Diagnosis not present

## 2023-01-18 DIAGNOSIS — Z79890 Hormone replacement therapy (postmenopausal): Secondary | ICD-10-CM | POA: Diagnosis not present

## 2023-01-18 NOTE — Progress Notes (Signed)
Hematology and Oncology Follow Up Visit  Cheryl [name] [id number] 1956/05/24 67 y.o. 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 is back for follow-up for her Thalassemia minor and B12 deficiency:  She reports that she has had a hard time since her last visit with our office. She was diagnosed with Median Arcuate Ligament Syndrome and has had complications from this and its repair at [facility]. She had also had weight loss, N/V/D. Currently improved. She also had an area biopsied on her buttock which was found to be a low grade squamous cell lesion. She has a lymph node in her rectum that is being removed for biopsy on November 5th.   Currently she is taking oral B-12 once daily. She eats an iron rich diet as tolerated. There has been no bleeding to her knowledge: denies epistaxis, gingivitis, hemoptysis, hematemesis, hematuria, melena, excessive bruising, blood donation.   No excessive fatigue, SOB.   Wt Readings from Last 3 Encounters:  01/18/23 131 lb (59.4 kg)  11/12/22 132 lb 9.6 oz (60.1 kg)  08/01/22 151 lb 3.2 oz (68.6 kg)     Medications:   Current Outpatient Medications:    albuterol (PROAIR HFA) 108 (90 Base) MCG/ACT inhaler, Inhale 1-2 puffs into the lungs every 6 (six) hours as needed for  wheezing or shortness of breath., Disp: 8 g, Rfl: 2   buPROPion (WELLBUTRIN) 75 MG tablet, Take 1 tablet by mouth twice daily, Disp: 180 tablet, Rfl: 1   Calcium Carbonate-Vit D-Min (CALCIUM 1200 PO), Take 1 tablet by mouth daily., Disp: , Rfl:    Cholecalciferol (VITAMIN D) 125 MCG (5000 UT) CAPS, Take by mouth., Disp: , Rfl:    docusate sodium (COLACE) 100 MG capsule, Take 100 mg by mouth 2 (two) times daily., Disp: , Rfl:    gabapentin (NEURONTIN) 300 MG capsule, Take 1 capsule (300 mg total) by mouth 3 (three) times daily., Disp: 270 capsule, Rfl: 3   HYDROcodone-acetaminophen (NORCO/VICODIN) 5-325 MG tablet, Take 1 tablet by mouth 2 (two) times daily as needed for moderate pain., Disp: 60 tablet, Rfl: 0   metoprolol succinate (TOPROL-XL) 25 MG 24 hr tablet, Take 1 tablet (25 mg total) by mouth daily., Disp: 90 tablet, Rfl: 3   Prasterone, DHEA, 10 MG CAPS, every other day., Disp: , Rfl:    progesterone (PROMETRIUM) 200 MG capsule, Take 200 mg by mouth at bedtime., Disp: , Rfl:    rOPINIRole (REQUIP) 1 MG tablet, TAKE 1 TABLET BY MOUTH THREE TIMES DAILY, Disp: 90 tablet, Rfl: 1   rosuvastatin (CRESTOR) 10 MG tablet, Take 1 tablet (10 mg total) by mouth daily., Disp: 90 tablet, Rfl: 3   traMADol (ULTRAM) 50 MG tablet, Take 1  tablet by mouth twice daily as needed for pain, Disp: 60 tablet, Rfl: 2   venlafaxine XR (EFFEXOR-XR) 75 MG 24 hr capsule, TAKE 1 CAPSULE(75 MG) BY MOUTH DAILY WITH BREAKFAST, Disp: 90 capsule, Rfl: 3   vitamin C (ASCORBIC ACID) 500 MG tablet, Take 500 mg by mouth daily., Disp: , Rfl:    nitroGLYCERIN (NITROSTAT) 0.4 MG SL tablet, DISSOLVE 1 TABLET UNDER THE TONGUE EVERY 5 MINUTES AS  NEEDED FOR CHEST PAIN. MAX  OF 3 TABLETS IN 15 MINUTES. CALL 911 IF PAIN PERSISTS. (Patient not taking: Reported on 01/18/2023), Disp: 100 tablet, Rfl: 3  Allergies:  Allergies  Allergen Reactions   Peanuts [Peanut Oil]    Penicillins Other (See Comments)    Hives,rash,nausea,swelling,     Sulfa Antibiotics Other (See Comments)    Hives,rash,nausea,swelling   Corticosteroids     Other Reaction(s): RASH, SWELLING   Influenza Vac Split [Influenza Virus Vaccine]     Pleurisy  1996   Mobic [Meloxicam] Nausea Only    Renal insufficiency   Nickel    Nsaids Other (See Comments)    Decreased gfr    Penicillin G     Other Reaction(s): RASH , SWELLING   Prednisone Rash    Past Medical History, Surgical history, Social history, and Family History were reviewed and updated.  Review of Systems: As stated above   Physical Exam:  height is 5\' 3"  (1.6 m) and weight is 131 lb (59.4 kg). Her tympanic temperature is 96 F (35.6 C) (abnormal). Her blood pressure is 113/74 and her pulse is 62. Her respiration is 20.   Physical Exam General: NAD Cardiovascular: regular rate and rhythm Pulmonary: clear ant fields Extremities: no edema, no joint deformities Skin: no rashes Neurological: Weakness but otherwise nonfocal   Lab Results  Component Value Date   WBC 8.2 01/11/2023   HGB 8.4 (L) 01/11/2023   HCT 26.6 (L) 01/11/2023   MCV 69.5 (L) 01/11/2023   PLT 212 01/11/2023     Chemistry      Component Value Date/Time   NA 137 01/11/2023 1259   NA 139 05/23/2020 1147   K 4.3 01/11/2023 1259   CL 108 01/11/2023 1259   CO2 23 01/11/2023 1259   BUN 13 01/11/2023 1259   BUN 11 05/23/2020 1147   CREATININE 0.81 01/11/2023 1259   CREATININE 0.95 09/01/2014 1659   CREATININE 0.80 08/15/2012 1609   GLU 89 03/21/2015 0000      Component Value Date/Time   CALCIUM 8.7 (L) 01/11/2023 1259   CALCIUM 9.7 09/01/2014 1659   ALKPHOS 78 11/12/2022 1503   ALKPHOS 77 09/01/2014 1659   AST 14 11/12/2022 1503   AST 21 09/01/2014 1659   ALT 15 11/12/2022 1503   ALT 23 09/01/2014 1659   BILITOT 1.0 11/12/2022 1503   BILITOT 0.6 05/23/2020 1147   BILITOT 0.6 09/01/2014 1659      Assessment and Plan- Patient is a 67 y.o. 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 She currently takes folic acid and B12 orally once daily. IV iron PRN. We currently monitor her IDA, B-12 deficiency.  Review of her labs from 01/11/2023 reveal a Hgb 8.4 (down from 9.1), iron saturation of 33%, ferritin 187, B12 of 282. No iron needed at this time. She will increase her B12 supplementation to help with her anemia. Continue folic acid. She has close monitoring from her PCP and multiple specialists. If Hgb < 7 she will need  a blood transfusion and will alert [place]. For now we will plan on a 6 month follow up for consideration of additional IV iron. If follow up B12 labs are again low I would suggest IM supplementation.    Encounter Diagnoses  Name Primary?   Thalassemia minor Yes   Other iron deficiency anemia    Disposition: No Venofer today RTC 6 months MD +- Venofer. Labs 1 week prior.    Clent Jacks PA-C 8/23/20242:15 PM

## 2023-02-04 ENCOUNTER — Other Ambulatory Visit: Payer: Self-pay | Admitting: Internal Medicine

## 2023-02-12 ENCOUNTER — Ambulatory Visit (INDEPENDENT_AMBULATORY_CARE_PROVIDER_SITE_OTHER): Payer: PPO | Admitting: Internal Medicine

## 2023-02-12 ENCOUNTER — Encounter: Payer: Self-pay | Admitting: Internal Medicine

## 2023-02-12 DIAGNOSIS — Z720 Tobacco use: Secondary | ICD-10-CM

## 2023-02-12 DIAGNOSIS — R634 Abnormal weight loss: Secondary | ICD-10-CM

## 2023-02-12 DIAGNOSIS — I739 Peripheral vascular disease, unspecified: Secondary | ICD-10-CM | POA: Diagnosis not present

## 2023-02-12 DIAGNOSIS — D563 Thalassemia minor: Secondary | ICD-10-CM | POA: Diagnosis not present

## 2023-02-12 DIAGNOSIS — R011 Cardiac murmur, unspecified: Secondary | ICD-10-CM

## 2023-02-12 DIAGNOSIS — G8929 Other chronic pain: Secondary | ICD-10-CM

## 2023-02-12 DIAGNOSIS — M545 Low back pain, unspecified: Secondary | ICD-10-CM

## 2023-02-12 DIAGNOSIS — D508 Other iron deficiency anemias: Secondary | ICD-10-CM

## 2023-02-12 DIAGNOSIS — I771 Stricture of artery: Secondary | ICD-10-CM | POA: Diagnosis not present

## 2023-02-12 DIAGNOSIS — K582 Mixed irritable bowel syndrome: Secondary | ICD-10-CM

## 2023-02-12 MED ORDER — METOPROLOL SUCCINATE ER 25 MG PO TB24: 25.0000 mg | ORAL_TABLET | Freq: Every day | ORAL | 3 refills | Status: DC

## 2023-02-12 MED ORDER — BUPROPION HCL 100 MG PO TABS: 100.0000 mg | ORAL_TABLET | Freq: Two times a day (BID) | ORAL | 2 refills | Status: DC

## 2023-02-12 MED ORDER — ROPINIROLE HCL 1 MG PO TABS: 1.0000 mg | ORAL_TABLET | Freq: Three times a day (TID) | ORAL | 1 refills | Status: DC

## 2023-02-12 MED ORDER — HYDROCODONE-ACETAMINOPHEN 5-325 MG PO TABS: 1.0000 | ORAL_TABLET | Freq: Two times a day (BID) | ORAL | 0 refills | Status: DC | PRN

## 2023-02-12 MED ORDER — BUPROPION HCL 75 MG PO TABS: 75.0000 mg | ORAL_TABLET | Freq: Two times a day (BID) | ORAL | 1 refills | Status: DC

## 2023-02-12 NOTE — Progress Notes (Signed)
Subjective:  Patient ID: Cheryl [name], female    DOB: Oct 05, 1955  Age: 67 y.o. MRN: [id number]  CC: The primary encounter diagnosis was Hemochromatosis, hereditary (HCC). Diagnoses of Systolic murmur, Other iron deficiency anemia, Weight loss, unintentional, Irritable bowel syndrome with both constipation and diarrhea, Thalassemia minor, Chronic low back pain without sciatica, unspecified back pain laterality, PAD (peripheral artery disease) (HCC), Tobacco abuse, and Celiac artery stenosis (HCC) were also pertinent to this visit.   HPI Cheryl [name] presents for  Chief Complaint  Patient presents with   Medical Management of Chronic Issues    3 month follow up     1) patient reports feeling dizzy with position change,  from lying to sitting up   2) MALS:  s/p surgery in April but hs ongoing weight loss, unintentional  due to persistent  abd pain.   reviewed August  note from [facility] vascular surgery:   Mesenteric Artery Duplex (11/27/22); Evidence of a 70 to 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 Elevated velocities with an irregular flow channel remain within the celiac artery. PST is noted in the distal celiac, hepatic, and splenic arteries. Velocities in the celiac artery remain elevated with breathing maneuvers.  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 Patient continues to have abdominal pain, vomiting, and diarrhea. Imaging shows 70-99% stenosis of the celiac artery.   Plan: RTC in 3 months with repeat mesenteric duplex. Return precautions reviewed.  Referral sent to GI  Colestipol 1 gram twice daily NOT TOLERATED DUE OT INCREASED DIARRHEA AND ABD CRAMPING.  RETURNS OCT 24 TO GI   3) anal CA :  colonoscopy done August.  Path report  LGSIL.  Surgery planned for Nov 5   has had some intermittent bleeding with increased defecation . [name]   4) UNINENTIONAL WT LOSS OF 30-35 LBS.  WEIGHT HAS PLATEAUED.I'm trying to quit " but still  buying 2 cartons per montth  Brother died of liver cancer due to cirrhosis hereditary hemochromatosis . He also had thalassemia  wants to be checked  STOOLS ARE SOFT NOT LIQUID.    Frustrated by weakness due to weight loss.  ORTHOSTATIC AFTER SQUATTING       Outpatient Medications Prior to Visit  Medication Sig Dispense Refill   albuterol (PROAIR HFA) 108 (90 Base) MCG/ACT inhaler Inhale 1-2 puffs into the lungs every 6 (six) hours as needed for wheezing or shortness of breath. 8 g 2   Calcium Carbonate-Vit D-Min (CALCIUM 1200 PO) Take 1 tablet by mouth daily.     Cholecalciferol (VITAMIN D) 125 MCG (5000 UT) CAPS Take by mouth.     docusate sodium (COLACE) 100 MG capsule Take 100 mg by mouth 2 (two) times daily.     gabapentin (NEURONTIN) 300 MG capsule Take 1 capsule (300 mg total) by mouth 3 (three) times daily. 270 capsule 3   mirtazapine (REMERON) 7.5 MG tablet Take 7.5 mg by mouth at bedtime.     nitroGLYCERIN (NITROSTAT) 0.4 MG SL tablet DISSOLVE 1 TABLET UNDER THE TONGUE EVERY 5 MINUTES AS  NEEDED FOR CHEST PAIN. MAX  OF 3 TABLETS IN 15 MINUTES. CALL 911 IF PAIN PERSISTS. 100 tablet 3   Prasterone, DHEA, 10 MG CAPS every other day.     progesterone (PROMETRIUM) 200 MG capsule Take 200 mg by mouth at bedtime.     rosuvastatin (CRESTOR) 10 MG tablet Take 1 tablet (10 mg total) by mouth daily. 90 tablet  3   traMADol (ULTRAM) 50 MG tablet Take 1 tablet by mouth twice daily as needed for pain 60 tablet 2   venlafaxine XR (EFFEXOR-XR) 75 MG 24 hr capsule TAKE 1 CAPSULE(75 MG) BY MOUTH DAILY WITH BREAKFAST 90 capsule 3   vitamin C (ASCORBIC ACID) 500 MG tablet Take 500 mg by mouth daily.     buPROPion (WELLBUTRIN) 75 MG tablet Take 1 tablet by mouth twice daily 180 tablet 1   HYDROcodone-acetaminophen (NORCO/VICODIN) 5-325 MG tablet Take 1 tablet by mouth 2 (two) times daily as needed for moderate pain. 60 tablet 0   metoprolol succinate (TOPROL-XL) 25 MG 24 hr tablet Take 1 tablet (25  mg total) by mouth daily. 90 tablet 3   rOPINIRole (REQUIP) 1 MG tablet TAKE 1 TABLET BY MOUTH THREE TIMES DAILY 90 tablet 0   No facility-administered medications prior to visit.    Review of Systems;  Patient denies headache, fevers, malaise, unintentional weight loss, skin rash, eye pain, sinus congestion and sinus pain, sore throat, dysphagia,  hemoptysis , cough, dyspnea, wheezing, chest pain, palpitations, orthopnea, edema, abdominal pain, nausea, melena, diarrhea, constipation, flank pain, dysuria, hematuria, urinary  Frequency, nocturia, numbness, tingling, seizures,  Focal weakness, Loss of consciousness,  Tremor, insomnia, depression, anxiety, and suicidal ideation.      Objective:  BP 104/60   Pulse 66   Temp 97.6 F (36.4 C) (Oral)   Ht 5\' 3"  (1.6 m)   Wt 132 lb 6.4 oz (60.1 kg)   SpO2 97%   BMI 23.45 kg/m   BP Readings from Last 3 Encounters:  02/12/23 104/60  01/18/23 113/74  11/12/22 126/72    Wt Readings from Last 3 Encounters:  02/12/23 132 lb 6.4 oz (60.1 kg)  01/18/23 131 lb (59.4 kg)  11/12/22 132 lb 9.6 oz (60.1 kg)    Physical Exam Vitals reviewed.  Constitutional:      General: She is not in acute distress.    Appearance: Normal appearance. She is normal weight. She is not ill-appearing, toxic-appearing or diaphoretic.  HENT:     Head: Normocephalic.  Eyes:     General: No scleral icterus.       Right eye: No discharge.        Left eye: No discharge.     Conjunctiva/sclera: Conjunctivae normal.  Cardiovascular:     Rate and Rhythm: Normal rate and regular rhythm.     Heart sounds: Normal heart sounds.  Pulmonary:     Effort: Pulmonary effort is normal. No respiratory distress.     Breath sounds: Normal breath sounds.  Musculoskeletal:        General: Normal range of motion.  Skin:    General: Skin is warm and dry.  Neurological:     General: No focal deficit present.     Mental Status: She is alert and oriented to person, place, and  time. Mental status is at baseline.  Psychiatric:        Mood and Affect: Mood normal.        Behavior: Behavior normal.        Thought Content: Thought content normal.        Judgment: Judgment normal.    Lab Results  Component Value Date   HGBA1C 5.5 05/02/2022   HGBA1C 5.1 10/26/2015   HGBA1C 5.4 11/25/2013    Lab Results  Component Value Date   CREATININE 0.81 01/11/2023   CREATININE 0.87 11/12/2022   CREATININE 0.88 07/18/2022    Lab  Results  Component Value Date   WBC 8.2 01/11/2023   HGB 8.4 (L) 01/11/2023   HCT 26.6 (L) 01/11/2023   PLT 212 01/11/2023   GLUCOSE 121 (H) 01/11/2023   CHOL 89 11/12/2022   TRIG 83.0 11/12/2022   HDL 31.60 (L) 11/12/2022   LDLDIRECT 58.0 05/02/2022   LDLCALC 41 11/12/2022   ALT 15 11/12/2022   AST 14 11/12/2022   NA 137 01/11/2023   K 4.3 01/11/2023   CL 108 01/11/2023   CREATININE 0.81 01/11/2023   BUN 13 01/11/2023   CO2 23 01/11/2023   TSH 0.76 11/12/2022   INR 1.1 07/17/2022   HGBA1C 5.5 05/02/2022   MICROALBUR 1.1 05/02/2022    CT Angio Chest/Abd/Pel for Dissection W and/or W/WO  Result Date: 07/18/2022 CLINICAL DATA:  29 female with history of chronic mesenteric ischemia. EXAM: CT ANGIOGRAPHY CHEST, ABDOMEN AND PELVIS TECHNIQUE: Multidetector CT imaging through the chest, abdomen and pelvis was performed using the standard protocol during bolus administration of intravenous contrast. Multiplanar reconstructed images and MIPs were obtained and reviewed to evaluate the vascular anatomy. RADIATION DOSE REDUCTION: This exam was performed according to the departmental dose-optimization program which includes automated exposure control, adjustment of the mA and/or kV according to patient size and/or use of iterative reconstruction technique. CONTRAST:  OMNIPAQUE IOHEXOL 350 MG/ML SOLN COMPARISON:  CTA chest from 01/30/2019 CTA abdomen 08/17/2015 FINDINGS: CTA CHEST FINDINGS VASCULAR Preferential opacification of the  thoracic aorta. No evidence of thoracic aortic aneurysm or dissection. Scattered atherosclerotic calcifications about the aortic arch and descending thoracic aorta. Normal heart size. No pericardial effusion. Sinues of Valsalva: 27 mm 28 x 27 mm ,unchanged Sinotubular Junction: 26 mm ,unchanged Ascending Aorta: 33 mm ,unchanged Aortic Arch: 32 mm ,unchanged Descending aorta: 25 mm at the level of the carina ,unchanged Branch vessels: Conventional branching pattern. Scattered atherosclerotic changes without evidence of significant ostial stenosis. Coronary arteries: Normal origins and courses. Severe atherosclerotic calcifications about the proximal anterior descending and circumflex coronary arteries. Main pulmonary artery: 21 mm ,unchanged. No evidence of central pulmonary embolism. Pulmonary veins: No anomalous pulmonary venous return. No evidence of left atrial appendage thrombus. NON VASCULAR Mediastinum/Nodes: No enlarged mediastinal, hilar, or axillary lymph nodes. Thyroid gland, trachea, and esophagus demonstrate no significant findings. Lungs/Pleura: No focal consolidations. No suspicious pulmonary nodules. No pleural effusion or pneumothorax. Musculoskeletal: No chest wall abnormality. No acute or significant osseous findings. Review of the MIP images confirms the above findings. CTA ABDOMEN AND PELVIS FINDINGS VASCULAR Aorta: Normal caliber aorta without aneurysm, dissection, vasculitis or significant stenosis. Scattered atherosclerotic calcifications most prominent in the infrarenal segment. Celiac: Moderate focal proximal stenosis with poststenotic ectasia of the celiac trunk measuring up to approximately 1.0 cm. Interval development of chronic appearing occlusion of the proximal splenic artery with reconstitution via pancreatic collaterals. 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 SMA: Patent  without evidence of aneurysm, dissection, vasculitis or significant stenosis. Renals: Single bilateral renal arteries are patent without evidence of aneurysm, dissection, vasculitis, fibromuscular dysplasia or significant stenosis. IMA: Patent without evidence of aneurysm, dissection, vasculitis or significant stenosis. Inflow: Moderate focal proximal stenosis of the left common iliac artery secondary to fibrofatty and calcific atherosclerotic plaque. Patent distally. The right inflow vessels are widely patent. Veins: No obvious venous abnormality within the limitations of this arterial phase study. Review of the MIP images confirms the above findings. NON-VASCULAR Hepatobiliary: No focal liver abnormality is seen.  No gallstones, gallbladder wall thickening, or biliary dilatation. Pancreas: Unremarkable. No pancreatic ductal dilatation or surrounding inflammatory changes. Spleen: Normal in size without focal abnormality. Adrenals/Urinary Tract: Adrenal glands are unremarkable. Kidneys are normal, without renal calculi, focal lesion, or hydronephrosis. Bladder is unremarkable. Stomach/Bowel: Stomach is within normal limits. Appendix appears normal. Descending and sigmoid colonic diverticula without surrounding inflammatory changes. No evidence of bowel wall thickening, distention, or inflammatory changes. Lymphatic: No abdominopelvic lymphadenopathy. Reproductive: Status post hysterectomy. No adnexal masses. Other: No abdominal wall hernia or abnormality. No abdominopelvic ascites. Musculoskeletal: No acute or significant osseous findings. IMPRESSION: VASCULAR 1. 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 The superior and inferior mesenteric arteries are widely patent. 2. 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 3. Coronary and aortic atherosclerosis (ICD10-I70.0). NON-VASCULAR 1. Diverticulosis. 2. No acute or significant intrathoracic abnormality. Marliss Coots, MD [facility] Electronically Signed   By: Marliss Coots M.D.   On: 07/18/2022 09:26   PERIPHERAL VASCULAR CATHETERIZATION  Result Date: 07/17/2022 See surgical note for result.   Assessment & Plan:  .Hemochromatosis, hereditary (HCC) -     Hemochromatosis DNA-PCR(c282y,h63d)  Systolic murmur -     ECHOCARDIOGRAM COMPLETE; Future  Other iron deficiency anemia -     CBC with Differential/Platelet -     Comprehensive metabolic panel  Weight loss, unintentional -     Magnesium  Irritable bowel syndrome with both constipation and diarrhea Assessment & Plan: Unclear how much of her continued symptoms are IBS triggered vs PAD. She did not tolerate Colestipol   Thalassemia minor Assessment & Plan: She is  received an iron infusions periodically  by hematology    Chronic low back pain without sciatica, unspecified back pain laterality Assessment & Plan: Secondary to lumbar spinal stenosis.  Her pain requires use of chronic opiate therapy; NSAIDS are contraindicated due to CKD .  Refill history confirmed via [place] Controlled Substance databas, accessed by me today... refills given   PAD (peripheral artery disease) (HCC) Assessment & Plan: Continue aggressive management of blood pressure and cholesterol with losartan,  Toprol XL  and Rosuvastatin.  Strongly advised to quit smoking   Lab Results  Component Value Date   CHOL 89 11/12/2022   HDL 31.60 (L) 11/12/2022   LDLCALC 41 11/12/2022   LDLDIRECT 58.0 05/02/2022   TRIG 83.0 11/12/2022   CHOLHDL 3 11/12/2022      Tobacco abuse Assessment & Plan: She is smoking 1-2 packs daily. 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 Still smoking ,  still with cervical artery stenosis    Other orders -     rOPINIRole HCl; Take 1 tablet (1 mg total) by mouth 3 (three) times daily.  Dispense: 90 tablet; Refill: 1 -     buPROPion HCl; Take 1 tablet (100 mg total) by mouth 2 (two) times daily.  Dispense: 60 tablet; Refill: 2 -     HYDROcodone-Acetaminophen; Take 1 tablet by mouth 2 (two) times daily as needed for moderate pain.  Dispense: 60 tablet; Refill: 0     I provided 42 minutes of face-to-face time during this encounter reviewing patient's last visit with  me, patient's  most recent visit with vascular surgery,  gastroenterology ,  recent surgical and non surgical procedures, previous  labs and imaging studies, counseling on currently addressed issues,  and post visit ordering to diagnostics and therapeutics .   Follow-up: Return in about 4 weeks (around 03/12/2023).   Sherlene Shams, MD

## 2023-02-12 NOTE — Assessment & Plan Note (Signed)
Continue aggressive management of blood pressure and cholesterol with losartan,  Toprol XL  and Rosuvastatin.  Strongly advised to quit smoking   Lab Results  Component Value Date   CHOL 89 11/12/2022   HDL 31.60 (L) 11/12/2022   LDLCALC 41 11/12/2022   LDLDIRECT 58.0 05/02/2022   TRIG 83.0 11/12/2022   CHOLHDL 3 11/12/2022

## 2023-02-12 NOTE — Assessment & Plan Note (Signed)
She is  received an iron infusions periodically  by hematology

## 2023-02-12 NOTE — Assessment & Plan Note (Signed)
S.p MALS surgery AT [facility] April 2024. Still smoking ,  still with cervical artery stenosis

## 2023-02-12 NOTE — Patient Instructions (Addendum)
Stop the metoprolol   INCREASE WELLBUTRIN DOSE TO 100 MG TWICE DAILY   Stop smoking!!!   Try drinking a BOOST  EVERY MORNING  AS YOUR BREAKFAST

## 2023-02-12 NOTE — Assessment & Plan Note (Signed)
Secondary to lumbar spinal stenosis.  Her pain requires use of chronic opiate therapy; NSAIDS are contraindicated due to CKD .  Refill history confirmed via [place] Controlled Substance databas, accessed by me today... refills given

## 2023-02-12 NOTE — Assessment & Plan Note (Signed)
Unclear how much of her continued symptoms are IBS triggered vs PAD. She did not tolerate Colestipol

## 2023-02-12 NOTE — Assessment & Plan Note (Signed)
She is smoking 1-2 packs daily. She was advised of the effect of inhaled nicotine on blood flow

## 2023-02-13 ENCOUNTER — Telehealth: Payer: Self-pay | Admitting: Internal Medicine

## 2023-02-13 NOTE — Telephone Encounter (Signed)
As per Dr. Darrick Huntsman patient diagnosed with-anal cancer awaiting surgery.  Worsening anemia-   Please move up patient's appointments from 2025 to this week or early next week- Thanks GB

## 2023-02-20 ENCOUNTER — Inpatient Hospital Stay: Payer: PPO | Admitting: Internal Medicine

## 2023-02-20 ENCOUNTER — Encounter: Payer: Self-pay | Admitting: Internal Medicine

## 2023-02-20 ENCOUNTER — Inpatient Hospital Stay: Payer: PPO

## 2023-02-20 ENCOUNTER — Inpatient Hospital Stay: Payer: PPO | Attending: Internal Medicine

## 2023-02-20 VITALS — BP 140/74 | HR 80 | Temp 96.3°F | Ht 63.0 in | Wt 131.0 lb

## 2023-02-20 DIAGNOSIS — D508 Other iron deficiency anemias: Secondary | ICD-10-CM

## 2023-02-20 DIAGNOSIS — Z800 Family history of malignant neoplasm of digestive organs: Secondary | ICD-10-CM | POA: Diagnosis not present

## 2023-02-20 DIAGNOSIS — Z803 Family history of malignant neoplasm of breast: Secondary | ICD-10-CM | POA: Insufficient documentation

## 2023-02-20 DIAGNOSIS — Z79899 Other long term (current) drug therapy: Secondary | ICD-10-CM | POA: Diagnosis not present

## 2023-02-20 DIAGNOSIS — D563 Thalassemia minor: Secondary | ICD-10-CM

## 2023-02-20 DIAGNOSIS — D509 Iron deficiency anemia, unspecified: Secondary | ICD-10-CM | POA: Diagnosis not present

## 2023-02-20 DIAGNOSIS — C210 Malignant neoplasm of anus, unspecified: Secondary | ICD-10-CM | POA: Insufficient documentation

## 2023-02-20 DIAGNOSIS — E538 Deficiency of other specified B group vitamins: Secondary | ICD-10-CM | POA: Insufficient documentation

## 2023-02-20 DIAGNOSIS — F17210 Nicotine dependence, cigarettes, uncomplicated: Secondary | ICD-10-CM | POA: Insufficient documentation

## 2023-02-20 LAB — IRON AND TIBC
Iron: 107 ug/dL (ref 28–170)
Saturation Ratios: 35 % — ABNORMAL HIGH (ref 10.4–31.8)
TIBC: 309 ug/dL (ref 250–450)
UIBC: 202 ug/dL

## 2023-02-20 LAB — RETICULOCYTES
Immature Retic Fract: 31.5 % — ABNORMAL HIGH (ref 2.3–15.9)
RBC.: 4.01 MIL/uL (ref 3.87–5.11)
Retic Count, Absolute: 133.9 10*3/uL (ref 19.0–186.0)
Retic Ct Pct: 3.3 % — ABNORMAL HIGH (ref 0.4–3.1)

## 2023-02-20 LAB — CBC WITH DIFFERENTIAL (CANCER CENTER ONLY)
Abs Immature Granulocytes: 0.05 10*3/uL (ref 0.00–0.07)
Basophils Absolute: 0.1 10*3/uL (ref 0.0–0.1)
Basophils Relative: 1 %
Eosinophils Absolute: 0.2 10*3/uL (ref 0.0–0.5)
Eosinophils Relative: 2 %
HCT: 27.9 % — ABNORMAL LOW (ref 36.0–46.0)
Hemoglobin: 8.7 g/dL — ABNORMAL LOW (ref 12.0–15.0)
Immature Granulocytes: 1 %
Lymphocytes Relative: 26 %
Lymphs Abs: 2 10*3/uL (ref 0.7–4.0)
MCH: 22.7 pg — ABNORMAL LOW (ref 26.0–34.0)
MCHC: 31.2 g/dL (ref 30.0–36.0)
MCV: 72.8 fL — ABNORMAL LOW (ref 80.0–100.0)
Monocytes Absolute: 0.7 10*3/uL (ref 0.1–1.0)
Monocytes Relative: 9 %
Neutro Abs: 4.9 10*3/uL (ref 1.7–7.7)
Neutrophils Relative %: 61 %
Platelet Count: 251 10*3/uL (ref 150–400)
RBC: 3.83 MIL/uL — ABNORMAL LOW (ref 3.87–5.11)
RDW: 19.7 % — ABNORMAL HIGH (ref 11.5–15.5)
WBC Count: 7.8 10*3/uL (ref 4.0–10.5)
nRBC: 1.3 % — ABNORMAL HIGH (ref 0.0–0.2)

## 2023-02-20 LAB — BASIC METABOLIC PANEL - CANCER CENTER ONLY
Anion gap: 8 (ref 5–15)
BUN: 18 mg/dL (ref 8–23)
CO2: 22 mmol/L (ref 22–32)
Calcium: 8.9 mg/dL (ref 8.9–10.3)
Chloride: 109 mmol/L (ref 98–111)
Creatinine: 0.89 mg/dL (ref 0.44–1.00)
GFR, Estimated: >60 mL/min (ref >60)
Glucose, Bld: 100 mg/dL — ABNORMAL HIGH (ref 70–99)
Potassium: 4.5 mmol/L (ref 3.5–5.1)
Sodium: 139 mmol/L (ref 135–145)

## 2023-02-20 LAB — VITAMIN B12: Vitamin B-12: 349 pg/mL (ref 180–914)

## 2023-02-20 LAB — LACTATE DEHYDROGENASE: LDH: 138 U/L (ref 98–192)

## 2023-02-20 LAB — FERRITIN: Ferritin: 139 ng/mL (ref 11–307)

## 2023-02-20 MED ORDER — FOLIC ACID 1 MG PO TABS: 1.0000 mg | ORAL_TABLET | Freq: Every day | ORAL | 1 refills | Status: DC

## 2023-02-20 NOTE — Progress Notes (Signed)
C/O dizziness that been going on "for a while" and fatigue.  No appetite, drinks 1-2 boost per day.  Pt had celiac surgery 08/2022, has had chest pain and SOB since then.

## 2023-02-20 NOTE — Progress Notes (Signed)
[facility] CONSULT NOTE  Patient Care Team: Sherlene Shams, MD as PCP - General (Internal Medicine) Lemar Livings Merrily Pew, MD as Consulting Physician (General Surgery) Earna Coder, MD as Consulting Physician (Oncology)  CHIEF COMPLAINTS/PURPOSE OF CONSULTATION: Anemia.  HEMATOLOGY HISTORY  # CHRONIC MICROCYTIC ANEMIA- BETA THALASSEMIA MINOR [colo- 2016; Dr. Fanny Skates July 2017- IV trial of Venofer x4   # Lung nodules [CT- sub cm Jan 2017]-2019 CT scan stable.  Benign.  HISTORY OF PRESENTING ILLNESS: Alone. Walking independently.   Cheryl [name] 67 y.o.  female with a history of beta thalassemia minor; also iron deficiency anemia/B12 deficiency is here for follow-up.  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  Worsening anemia-   Patient is currently retired.  Denies any shortness of breath or cough.  She denies any blood in stools or black or stools.  Appetite is good.  No weight loss.  Review of Systems  Constitutional:  Negative for chills, diaphoresis, fever and weight loss.  HENT:  Negative for nosebleeds and sore throat.   Eyes:  Negative for double vision.  Respiratory:  Negative for cough, hemoptysis, sputum production and wheezing.   Cardiovascular:  Negative for chest pain, palpitations, orthopnea and leg swelling.  Gastrointestinal:  Negative for abdominal pain, blood in stool, constipation, diarrhea, heartburn, melena, nausea and vomiting.  Genitourinary:  Negative for dysuria, frequency and urgency.  Musculoskeletal:  Negative for back pain and joint pain.  Skin: Negative.  Negative for itching and rash.  Neurological:  Negative for dizziness, tingling, focal weakness, weakness and headaches.  Endo/Heme/Allergies:  Does not bruise/bleed easily.  Psychiatric/Behavioral:  Negative for depression. The patient is not nervous/anxious and does not have insomnia.      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   Arrhythmia    left bundle branch block   Arthritis    Blood in stool    Chicken pox    Generalized headaches    History of blood transfusion    Kidney disease, chronic, stage III (GFR 30-59 ml/min) (HCC)    LBBB (left bundle branch block)    Renal disorder    Right lateral epicondylitis 08/13/2017   Thyroid disease    UTI (lower urinary tract infection)     SURGICAL HISTORY: Past Surgical History:  Procedure Laterality Date   ABDOMINAL HYSTERECTOMY  1997   APPENDECTOMY  1981   BREAST EXCISIONAL BIOPSY Bilateral 1976   neg   BREAST SURGERY  1976   CARDIAC CATHETERIZATION  2004   [facility]   CARDIAC CATHETERIZATION  2006   [facility]   cystic fibrosis tumor removal  1983   THUMB AMPUTATION  1992   traumatic   TONSILLECTOMY AND ADENOIDECTOMY  1964   VISCERAL ANGIOGRAPHY N/A 06/29/2022   Procedure: VISCERAL ANGIOGRAPHY;  Surgeon: Renford Dills, MD;  Location: [facility] INVASIVE CV LAB;  Service: Cardiovascular;  Laterality: N/A;   VISCERAL ANGIOGRAPHY N/A 07/17/2022   Procedure: VISCERAL ANGIOGRAPHY;  Surgeon: Renford Dills, MD;  Location: [facility] INVASIVE CV LAB;  Service: Cardiovascular;  Laterality: N/A;    SOCIAL HISTORY: Social History   Socioeconomic History   Marital status: Married    Spouse name: Not on file   Number of children: 3   Years of education: Not on file  Highest education level: Not on file  Occupational History   Occupation: Retired  Tobacco Use   Smoking status: Every Day    Current packs/day: 0.00    Average packs/day: 0.3 packs/day for 34.0 years (8.5 ttl pk-yrs)    Types: Cigarettes    Start date: 04/11/1979    Last attempt to quit: 04/10/2013    Years since quitting: 9.8   Smokeless tobacco: Never  Vaping Use   Vaping status: Never  Used  Substance and Sexual Activity   Alcohol use: Yes    Comment: occasional   Drug use: No   Sexual activity: Not on file  Other Topics Concern   Not on file  Social History Narrative   Married to [employer]; Works for [employer], Engineer, structural. Quit smoking 2018; ocassional alcohol; lives near to [place].    Social Determinants of Health   Financial Resource Strain: Low Risk  (10/15/2022)   Overall Financial Resource Strain (CARDIA)    Difficulty of Paying Living Expenses: Not hard at all  Food Insecurity: No Food Insecurity (10/15/2022)   Hunger Vital Sign    Worried About Running Out of Food in the Last Year: Never true    Ran Out of Food in the Last Year: Never true  Transportation Needs: No Transportation Needs (10/15/2022)   PRAPARE - Administrator, Civil Service (Medical): No    Lack of Transportation (Non-Medical): No  Physical Activity: Inactive (10/15/2022)   Exercise Vital Sign    Days of Exercise per Week: 0 days    Minutes of Exercise per Session: 0 min  Stress: No Stress Concern Present (10/15/2022)   [employer] of Occupational Health - Occupational Stress Questionnaire    Feeling of Stress : Not at all  Social Connections: Moderately Integrated (10/15/2022)   Social Connection and Isolation Panel [NHANES]    Frequency of Communication with Friends and Family: More than three times a week    Frequency of Social Gatherings with Friends and Family: Three times a week    Attends Religious Services: Never    Active Member of Clubs or Organizations: Yes    Attends Banker Meetings: More than 4 times per year    Marital Status: Married  Catering manager Violence: Not At Risk (10/15/2022)   Humiliation, Afraid, Rape, and Kick questionnaire    Fear of Current or Ex-Partner: No    Emotionally Abused: No    Physically Abused: No    Sexually Abused: No    FAMILY HISTORY: Family History  Problem Relation Age of Onset   Heart  disease Mother    Arthritis Mother    Cancer Mother        breast   Hyperlipidemia Mother    Hypertension Mother    Heart attack Mother    Breast cancer Mother 69   Heart disease Father    Diabetes Father    Arthritis Father    Hypertension Father    Parkinson's disease Father    Hodgkin's lymphoma Father        hodgkins disease, prostate   Heart disease Sister    Diabetes Sister    Diabetes Maternal Aunt    Heart disease Maternal Grandmother    Diabetes Maternal Grandmother    Heart disease Maternal Grandfather    Heart disease Paternal Grandmother    Diabetes Paternal Grandmother    Stroke Paternal Grandfather    Heart disease Paternal Grandfather    Diabetes Paternal Actor  Breast cancer Cousin        2 pat cousins   Heart disease Brother 37       ami x 8,  4 vessel CABG    Diabetes Brother    Liver disease Brother    Lung disease Brother     ALLERGIES:  is allergic to peanuts [peanut oil], penicillins, sulfa antibiotics, corticosteroids, influenza vac split [influenza virus vaccine], mobic [meloxicam], nickel, nsaids, penicillin g, and prednisone.  MEDICATIONS:  Current Outpatient Medications  Medication Sig Dispense Refill   albuterol (PROAIR HFA) 108 (90 Base) MCG/ACT inhaler Inhale 1-2 puffs into the lungs every 6 (six) hours as needed for wheezing or shortness of breath. 8 g 2   buPROPion (WELLBUTRIN) 100 MG tablet Take 1 tablet (100 mg total) by mouth 2 (two) times daily. 60 tablet 2   Calcium Carbonate-Vit D-Min (CALCIUM 1200 PO) Take 1 tablet by mouth daily.     Cholecalciferol (VITAMIN D) 125 MCG (5000 UT) CAPS Take by mouth.     docusate sodium (COLACE) 100 MG capsule Take 100 mg by mouth 2 (two) times daily.     gabapentin (NEURONTIN) 300 MG capsule Take 1 capsule (300 mg total) by mouth 3 (three) times daily. 270 capsule 3   HYDROcodone-acetaminophen (NORCO/VICODIN) 5-325 MG tablet Take 1 tablet by mouth 2 (two) times daily as needed for moderate  pain. 60 tablet 0   mirtazapine (REMERON) 7.5 MG tablet Take 7.5 mg by mouth at bedtime.     nitroGLYCERIN (NITROSTAT) 0.4 MG SL tablet DISSOLVE 1 TABLET UNDER THE TONGUE EVERY 5 MINUTES AS  NEEDED FOR CHEST PAIN. MAX  OF 3 TABLETS IN 15 MINUTES. CALL 911 IF PAIN PERSISTS. 100 tablet 3   Prasterone, DHEA, 10 MG CAPS every other day.     progesterone (PROMETRIUM) 200 MG capsule Take 200 mg by mouth at bedtime.     rOPINIRole (REQUIP) 1 MG tablet Take 1 tablet (1 mg total) by mouth 3 (three) times daily. 90 tablet 1   rosuvastatin (CRESTOR) 10 MG tablet Take 1 tablet (10 mg total) by mouth daily. 90 tablet 3   traMADol (ULTRAM) 50 MG tablet Take 1 tablet by mouth twice daily as needed for pain 60 tablet 2   venlafaxine XR (EFFEXOR-XR) 75 MG 24 hr capsule TAKE 1 CAPSULE(75 MG) BY MOUTH DAILY WITH BREAKFAST 90 capsule 3   vitamin C (ASCORBIC ACID) 500 MG tablet Take 500 mg by mouth daily.     No current facility-administered medications for this visit.      [name]  PHYSICAL EXAMINATION:   Vitals:   02/20/23 0843  BP: (!) 140/74  Pulse: 80  Temp: (!) 96.3 F (35.7 C)  SpO2: 99%    Filed Weights   02/20/23 0843  Weight: 131 lb (59.4 kg)    Physical Exam HENT:     Head: Normocephalic and atraumatic.     Mouth/Throat:     Pharynx: No oropharyngeal exudate.  Eyes:     Pupils: Pupils are equal, round, and reactive to light.  Cardiovascular:     Rate and Rhythm: Normal rate and regular rhythm.  Pulmonary:     Effort: Pulmonary effort is normal. No respiratory distress.     Breath sounds: Normal breath sounds. No wheezing.  Abdominal:     General: Bowel sounds are normal. There is no distension.     Palpations: Abdomen is soft. There is no mass.     Tenderness: There is no abdominal tenderness. There  is no guarding or rebound.  Musculoskeletal:        General: No tenderness. Normal range of motion.     Cervical back: Normal range of motion and neck supple.  Skin:    General: Skin  is warm.  Neurological:     Mental Status: She is alert and oriented to person, place, and time.  Psychiatric:        Mood and Affect: Affect normal.      LABORATORY DATA:  I have reviewed the data as listed Lab Results  Component Value Date   WBC 7.8 02/20/2023   HGB 8.7 (L) 02/20/2023   HCT 27.9 (L) 02/20/2023   MCV 72.8 (L) 02/20/2023   PLT 251 02/20/2023   Recent Labs    05/02/22 1608 06/29/22 1313 07/18/22 0215 11/12/22 1503 01/11/23 1259 02/12/23 1558 02/20/23 0912  NA 137  --  137 139 137 139 139  K 4.0  --  3.8 4.2 4.3 4.5 4.5  CL 104  --  106 107 108 108 109  CO2 26  --  25 24 23 27 22   GLUCOSE 92  --  100* 88 121* 90 100*  BUN 14   < > 14 10 13 13 18   CREATININE 1.00   < > 0.88 0.87 0.81 0.98 0.89  CALCIUM 9.0  --  8.3* 8.9 8.7* 8.4 8.9  GFRNONAA  --    < > >60  --  >60  --  >60  PROT 6.2  --   --  6.4  --  5.9*  --   ALBUMIN 4.4  --   --  4.3  --  3.9  --   AST 11  --   --  14  --  19  --   ALT 10  --   --  15  --  19  --   ALKPHOS 71  --   --  78  --  87  --   BILITOT 0.6  --   --  1.0  --  1.2  --    < > = values in this interval not displayed.     No results found.  ASSESSMENT & PLAN:   Thalassemia minor # Chronic microcytic anemia/history of beta thalassemia - out of proportion to her anemia. S/p IV venoferx4 in fall of 2017.  # Today hemoglobin is 8.7 saturation 39% ferritin 188 [slightly lower compared to previous]; HOLD Venofer. Continue dietary iron.   # AUG 2024- [colonoscopy; [facility]]- Anus nodule, biopsy:-Low-grade squamous intraepithelial lesion (AIN 1); awaiting surgery- in NOV 5th- 2024.   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 Patient continues to have abdominal pain, vomiting, and diarrhea. 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   # DISPOSITION: # add LDH; reticulocyte count to labs today-please  order # labs- again today- ordered.  # venofer weekly x3; # B12 injection-x1  # follow up in 1 month- APP- labs- cbc- possible venofer; and b12 injection- Dr.B.   Cc; Dr.Tullo.     Earna Coder, MD 02/20/2023 10:54 AM

## 2023-02-20 NOTE — Assessment & Plan Note (Addendum)
#   Chronic microcytic anemia/history of beta thalassemia - out of proportion to her anemia. S/p IV venoferx4 in fall of 2017.  # Today hemoglobin is 8.7 saturation 39% ferritin 188 [slightly lower compared to previous]; HOLD Venofer. Continue dietary iron.   # AUG 2024- [colonoscopy; [facility]]- Anus nodule, biopsy:-Low-grade squamous intraepithelial lesion (AIN 1); awaiting surgery- in NOV 5th- 2024.   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 Patient continues to have abdominal pain, vomiting, and diarrhea. 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   # DISPOSITION: # add LDH; reticulocyte count to labs today-please order # labs- again today- ordered.  # venofer weekly x3; # B12 injection-x1  # follow up in 1 month- APP- labs- cbc- possible venofer; and b12 injection- Dr.B.   Cc; Dr.Tullo.

## 2023-02-22 ENCOUNTER — Inpatient Hospital Stay: Payer: PPO

## 2023-02-22 VITALS — BP 142/77 | HR 81

## 2023-02-22 DIAGNOSIS — D509 Iron deficiency anemia, unspecified: Secondary | ICD-10-CM | POA: Diagnosis not present

## 2023-02-22 DIAGNOSIS — D508 Other iron deficiency anemias: Secondary | ICD-10-CM

## 2023-02-22 LAB — HAPTOGLOBIN: Haptoglobin: 69 mg/dL (ref 37–355)

## 2023-02-22 MED ORDER — SODIUM CHLORIDE 0.9 % IV SOLN
Freq: Once | INTRAVENOUS | Status: AC
  Filled 2023-02-22: qty 250

## 2023-02-22 MED ORDER — SODIUM CHLORIDE 0.9 % IV SOLN
200.0000 mg | Freq: Once | INTRAVENOUS | Status: AC
  Administered 2023-02-22: 200 mg via INTRAVENOUS
  Filled 2023-02-22: qty 200

## 2023-02-22 MED ORDER — CYANOCOBALAMIN 1000 MCG/ML IJ SOLN
1000.0000 ug | Freq: Once | INTRAMUSCULAR | Status: AC
  Administered 2023-02-22: 1000 ug via INTRAMUSCULAR
  Filled 2023-02-22: qty 1

## 2023-02-27 LAB — HEMOCHROMATOSIS DNA-PCR(C282Y,H63D)

## 2023-03-01 ENCOUNTER — Inpatient Hospital Stay: Payer: PPO | Attending: Internal Medicine

## 2023-03-01 VITALS — BP 138/75 | HR 77 | Temp 97.0°F | Resp 18

## 2023-03-01 DIAGNOSIS — E538 Deficiency of other specified B group vitamins: Secondary | ICD-10-CM | POA: Diagnosis not present

## 2023-03-01 DIAGNOSIS — D509 Iron deficiency anemia, unspecified: Secondary | ICD-10-CM | POA: Insufficient documentation

## 2023-03-01 DIAGNOSIS — D563 Thalassemia minor: Secondary | ICD-10-CM | POA: Insufficient documentation

## 2023-03-01 DIAGNOSIS — D508 Other iron deficiency anemias: Secondary | ICD-10-CM

## 2023-03-01 MED ORDER — SODIUM CHLORIDE 0.9 % IV SOLN
Freq: Once | INTRAVENOUS | Status: AC
  Filled 2023-03-01: qty 250

## 2023-03-01 MED ORDER — SODIUM CHLORIDE 0.9 % IV SOLN
200.0000 mg | Freq: Once | INTRAVENOUS | Status: AC
  Administered 2023-03-01: 200 mg via INTRAVENOUS
  Filled 2023-03-01: qty 200

## 2023-03-01 NOTE — Patient Instructions (Signed)
Iron Sucrose Injection What is this medication? IRON SUCROSE (EYE ern SOO krose) treats low levels of iron (iron deficiency anemia) in people with kidney disease. Iron is a mineral that plays an important role in making red blood cells, which carry oxygen from your lungs to the rest of your body. This medicine may be used for other purposes; ask your health care provider or pharmacist if you have questions. COMMON BRAND NAME(S): Venofer What should I tell my care team before I take this medication? They need to know if you have any of these conditions: Anemia not caused by low iron levels Heart disease High levels of iron in the blood Kidney disease Liver disease An unusual or allergic reaction to iron, other medications, foods, dyes, or preservatives Pregnant or trying to get pregnant Breastfeeding How should I use this medication? This medication is for infusion into a vein. It is given in a hospital or clinic setting. Talk to your care team about the use of this medication in children. While this medication may be prescribed for children as young as 2 years for selected conditions, precautions do apply. Overdosage: If you think you have taken too much of this medicine contact a poison control center or emergency room at once. NOTE: This medicine is only for you. Do not share this medicine with others. What if I miss a dose? Keep appointments for follow-up doses. It is important not to miss your dose. Call your care team if you are unable to keep an appointment. What may interact with this medication? Do not take this medication with any of the following: Deferoxamine Dimercaprol Other iron products This medication may also interact with the following: Chloramphenicol Deferasirox This list may not describe all possible interactions. Give your health care provider a list of all the medicines, herbs, non-prescription drugs, or dietary supplements you use. Also tell them if you smoke,  drink alcohol, or use illegal drugs. Some items may interact with your medicine. What should I watch for while using this medication? Visit your care team regularly. Tell your care team if your symptoms do not start to get better or if they get worse. You may need blood work done while you are taking this medication. You may need to follow a special diet. Talk to your care team. Foods that contain iron include: whole grains/cereals, dried fruits, beans, or peas, leafy green vegetables, and organ meats (liver, kidney). What side effects may I notice from receiving this medication? Side effects that you should report to your care team as soon as possible: Allergic reactions--skin rash, itching, hives, swelling of the face, lips, tongue, or throat Low blood pressure--dizziness, feeling faint or lightheaded, blurry vision Shortness of breath Side effects that usually do not require medical attention (report to your care team if they continue or are bothersome): Flushing Headache Joint pain Muscle pain Nausea Pain, redness, or irritation at injection site This list may not describe all possible side effects. Call your doctor for medical advice about side effects. You may report side effects to FDA at 1-800-FDA-1088. Where should I keep my medication? This medication is given in a hospital or clinic. It will not be stored at home. NOTE: This sheet is a summary. It may not cover all possible information. If you have questions about this medicine, talk to your doctor, pharmacist, or health care provider.  2024 Elsevier/Gold Standard (2022-10-19 00:00:00)

## 2023-03-05 LAB — HEMOCHROMATOSIS DNA-PCR(C282Y,H63D)

## 2023-03-08 ENCOUNTER — Inpatient Hospital Stay: Payer: PPO

## 2023-03-08 VITALS — BP 148/68 | HR 81 | Temp 96.1°F | Resp 18

## 2023-03-08 DIAGNOSIS — D509 Iron deficiency anemia, unspecified: Secondary | ICD-10-CM | POA: Diagnosis not present

## 2023-03-08 DIAGNOSIS — D508 Other iron deficiency anemias: Secondary | ICD-10-CM

## 2023-03-08 MED ORDER — SODIUM CHLORIDE 0.9 % IV SOLN
200.0000 mg | Freq: Once | INTRAVENOUS | Status: AC
  Administered 2023-03-08: 200 mg via INTRAVENOUS
  Filled 2023-03-08: qty 200

## 2023-03-08 MED ORDER — SODIUM CHLORIDE 0.9 % IV SOLN
Freq: Once | INTRAVENOUS | Status: AC
  Filled 2023-03-08: qty 250

## 2023-03-15 ENCOUNTER — Telehealth: Payer: Self-pay | Admitting: Internal Medicine

## 2023-03-15 MED ORDER — HYDROCODONE-ACETAMINOPHEN 5-325 MG PO TABS: 1.0000 | ORAL_TABLET | Freq: Two times a day (BID) | ORAL | 0 refills | Status: DC | PRN

## 2023-03-15 NOTE — Addendum Note (Signed)
Addended by: Sherlene Shams on: 03/15/2023 04:59 PM   Modules accepted: Orders

## 2023-03-15 NOTE — Telephone Encounter (Signed)
Requesting: Hydrocodone Contract: No UDS: No Last Visit: 02/12/2023 Next Visit: 03/26/2023 Last Refill: 02/12/2023  Please Advise

## 2023-03-15 NOTE — Telephone Encounter (Signed)
Patient spouse called in and states patient needs med refill on HYDROcodone-acetaminophen (NORCO/VICODIN) 5-325 MG tablet .  LOV 02/12/2023

## 2023-03-18 DIAGNOSIS — F3342 Major depressive disorder, recurrent, in full remission: Secondary | ICD-10-CM | POA: Diagnosis not present

## 2023-03-18 DIAGNOSIS — J449 Chronic obstructive pulmonary disease, unspecified: Secondary | ICD-10-CM | POA: Diagnosis not present

## 2023-03-18 DIAGNOSIS — I10 Essential (primary) hypertension: Secondary | ICD-10-CM | POA: Diagnosis not present

## 2023-03-21 DIAGNOSIS — K580 Irritable bowel syndrome with diarrhea: Secondary | ICD-10-CM | POA: Diagnosis not present

## 2023-03-22 ENCOUNTER — Inpatient Hospital Stay: Payer: PPO

## 2023-03-22 ENCOUNTER — Inpatient Hospital Stay (HOSPITAL_BASED_OUTPATIENT_CLINIC_OR_DEPARTMENT_OTHER): Payer: PPO | Admitting: Nurse Practitioner

## 2023-03-22 VITALS — BP 140/79 | HR 150 | Temp 98.1°F | Wt 129.0 lb

## 2023-03-22 VITALS — BP 125/72 | HR 72

## 2023-03-22 DIAGNOSIS — D508 Other iron deficiency anemias: Secondary | ICD-10-CM

## 2023-03-22 DIAGNOSIS — D563 Thalassemia minor: Secondary | ICD-10-CM | POA: Diagnosis not present

## 2023-03-22 DIAGNOSIS — D509 Iron deficiency anemia, unspecified: Secondary | ICD-10-CM | POA: Diagnosis not present

## 2023-03-22 LAB — CBC WITH DIFFERENTIAL (CANCER CENTER ONLY)
Abs Immature Granulocytes: 0.03 10*3/uL (ref 0.00–0.07)
Basophils Absolute: 0.1 10*3/uL (ref 0.0–0.1)
Basophils Relative: 1 %
Eosinophils Absolute: 0.1 10*3/uL (ref 0.0–0.5)
Eosinophils Relative: 2 %
HCT: 28.3 % — ABNORMAL LOW (ref 36.0–46.0)
Hemoglobin: 8.7 g/dL — ABNORMAL LOW (ref 12.0–15.0)
Immature Granulocytes: 0 %
Lymphocytes Relative: 25 %
Lymphs Abs: 1.8 10*3/uL (ref 0.7–4.0)
MCH: 22.7 pg — ABNORMAL LOW (ref 26.0–34.0)
MCHC: 30.7 g/dL (ref 30.0–36.0)
MCV: 73.7 fL — ABNORMAL LOW (ref 80.0–100.0)
Monocytes Absolute: 0.6 10*3/uL (ref 0.1–1.0)
Monocytes Relative: 8 %
Neutro Abs: 4.6 10*3/uL (ref 1.7–7.7)
Neutrophils Relative %: 64 %
Platelet Count: 281 10*3/uL (ref 150–400)
RBC: 3.84 MIL/uL — ABNORMAL LOW (ref 3.87–5.11)
RDW: 19.3 % — ABNORMAL HIGH (ref 11.5–15.5)
WBC Count: 7.2 10*3/uL (ref 4.0–10.5)
nRBC: 1.1 % — ABNORMAL HIGH (ref 0.0–0.2)

## 2023-03-22 MED ORDER — CYANOCOBALAMIN 1000 MCG/ML IJ SOLN
1000.0000 ug | Freq: Once | INTRAMUSCULAR | Status: AC
  Administered 2023-03-22: 1000 ug via INTRAMUSCULAR
  Filled 2023-03-22: qty 1

## 2023-03-22 MED ORDER — IRON SUCROSE 20 MG/ML IV SOLN
200.0000 mg | Freq: Once | INTRAVENOUS | Status: AC
  Administered 2023-03-22: 200 mg via INTRAVENOUS
  Filled 2023-03-22: qty 10

## 2023-03-22 NOTE — Progress Notes (Signed)
[facility] CONSULT NOTE  Patient Care Team: Sherlene Shams, MD as PCP - General (Internal Medicine) Lemar Livings Merrily Pew, MD as Consulting Physician (General Surgery) Earna Coder, MD as Consulting Physician (Oncology)  CHIEF COMPLAINTS/PURPOSE OF CONSULTATION: Anemia.  HEMATOLOGY HISTORY  # CHRONIC MICROCYTIC ANEMIA- BETA THALASSEMIA MINOR [colo- 2016; Dr. Lemar Livings July 2017- IV trial of Venofer x4   # Lung nodules [CT- sub cm Jan 2017]-2019 CT scan stable.  Benign.  HISTORY OF PRESENTING ILLNESS: Alone. Walking independently.   Cheryl [name] 67 y.o.  female with a history of anal lesions, AIN 1, awaiting surgery on 04/02/23, beta thalassemia minor, iron deficiency and b12 deficiency who returns to clinic for follow up. She saw [facility] yesterday. She continues to have trouble with IBS and medications were changed yesterday. She's feeling tired. Becomes short of rbreath with exertion.    Review of Systems  Constitutional:  Positive for malaise/fatigue. Negative for chills, diaphoresis, fever and weight loss.  HENT:  Negative for nosebleeds and sore throat.   Eyes:  Negative for double vision.  Respiratory:  Positive for shortness of breath. Negative for cough, hemoptysis, sputum production and wheezing.   Cardiovascular:  Negative for chest pain, palpitations, orthopnea and leg swelling.  Gastrointestinal:  Negative for abdominal pain, blood in stool, constipation, diarrhea, heartburn, melena, nausea and vomiting.  Genitourinary:  Negative for dysuria, frequency and urgency.  Musculoskeletal:  Negative for back pain and joint pain.  Skin: Negative.  Negative for itching and rash.  Neurological:  Negative for dizziness, tingling, focal weakness, weakness and headaches.  Endo/Heme/Allergies:  Does not bruise/bleed easily.  Psychiatric/Behavioral:  Negative for depression. The patient is not nervous/anxious and does not have insomnia.     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   Arrhythmia    left bundle branch block   Arthritis    Blood in stool    Chicken pox    Generalized headaches    History of blood transfusion    Kidney disease, chronic, stage III (GFR 30-59 ml/min) (HCC)    LBBB (left bundle branch block)    Renal disorder    Right lateral epicondylitis 08/13/2017   Thyroid disease    UTI (lower urinary tract infection)     SURGICAL HISTORY: Past Surgical History:  Procedure Laterality Date   ABDOMINAL HYSTERECTOMY  1997   APPENDECTOMY  1981   BREAST EXCISIONAL BIOPSY Bilateral 1976   neg   BREAST SURGERY  1976   CARDIAC CATHETERIZATION  2004   [facility]   CARDIAC CATHETERIZATION  2006   [facility]   cystic fibrosis tumor removal  1983   THUMB AMPUTATION  1992   traumatic   TONSILLECTOMY AND ADENOIDECTOMY  1964   VISCERAL ANGIOGRAPHY N/A 06/29/2022   Procedure: VISCERAL ANGIOGRAPHY;  Surgeon: Renford Dills, MD;  Location: [facility] INVASIVE CV LAB;  Service: Cardiovascular;  Laterality: N/A;   VISCERAL ANGIOGRAPHY N/A 07/17/2022   Procedure: VISCERAL ANGIOGRAPHY;  Surgeon: Renford Dills, MD;  Location: [facility] INVASIVE CV LAB;  Service: Cardiovascular;  Laterality: N/A;    SOCIAL HISTORY: Social History   Socioeconomic History   Marital status: Married    Spouse name: Not on file   Number of children: 3   Years of education: Not on  file   Highest education level: Not on file  Occupational History   Occupation: Retired  Tobacco Use   Smoking status: Every Day    Current packs/day: 0.00    Average packs/day: 0.3 packs/day for 34.0 years (8.5 ttl pk-yrs)    Types: Cigarettes    Start date: 04/11/1979    Last attempt to quit: 04/10/2013    Years since quitting: 9.9   Smokeless  tobacco: Never  Vaping Use   Vaping status: Never Used  Substance and Sexual Activity   Alcohol use: Yes    Comment: occasional   Drug use: No   Sexual activity: Not on file  Other Topics Concern   Not on file  Social History Narrative   Married to [employer]; Works for [employer], Engineer, structural. Quit smoking 2018; ocassional alcohol; lives near to [place].    Social Determinants of Health   Financial Resource Strain: Low Risk  (10/15/2022)   Overall Financial Resource Strain (CARDIA)    Difficulty of Paying Living Expenses: Not hard at all  Food Insecurity: No Food Insecurity (10/15/2022)   Hunger Vital Sign    Worried About Running Out of Food in the Last Year: Never true    Ran Out of Food in the Last Year: Never true  Transportation Needs: No Transportation Needs (10/15/2022)   PRAPARE - Administrator, Civil Service (Medical): No    Lack of Transportation (Non-Medical): No  Physical Activity: Inactive (10/15/2022)   Exercise Vital Sign    Days of Exercise per Week: 0 days    Minutes of Exercise per Session: 0 min  Stress: No Stress Concern Present (10/15/2022)   [employer] of Occupational Health - Occupational Stress Questionnaire    Feeling of Stress : Not at all  Social Connections: Moderately Integrated (10/15/2022)   Social Connection and Isolation Panel [NHANES]    Frequency of Communication with Friends and Family: More than three times a week    Frequency of Social Gatherings with Friends and Family: Three times a week    Attends Religious Services: Never    Active Member of Clubs or Organizations: Yes    Attends Banker Meetings: More than 4 times per year    Marital Status: Married  Catering manager Violence: Not At Risk (10/15/2022)   Humiliation, Afraid, Rape, and Kick questionnaire    Fear of Current or Ex-Partner: No    Emotionally Abused: No    Physically Abused: No    Sexually Abused: No    FAMILY HISTORY: Family  History  Problem Relation Age of Onset   Heart disease Mother    Arthritis Mother    Cancer Mother        breast   Hyperlipidemia Mother    Hypertension Mother    Heart attack Mother    Breast cancer Mother 51   Heart disease Father    Diabetes Father    Arthritis Father    Hypertension Father    Parkinson's disease Father    Hodgkin's lymphoma Father        hodgkins disease, prostate   Heart disease Sister    Diabetes Sister    Diabetes Maternal Aunt    Heart disease Maternal Grandmother    Diabetes Maternal Grandmother    Heart disease Maternal Grandfather    Heart disease Paternal Grandmother    Diabetes Paternal Grandmother    Stroke Paternal Grandfather    Heart disease Paternal Grandfather    Diabetes Paternal Actor  Breast cancer Cousin        2 pat cousins   Heart disease Brother 14       ami x 8,  4 vessel CABG    Diabetes Brother    Liver disease Brother    Lung disease Brother     ALLERGIES:  is allergic to peanuts [peanut oil], penicillins, sulfa antibiotics, corticosteroids, influenza vac split [influenza virus vaccine], mobic [meloxicam], nickel, nsaids, penicillin g, and prednisone.  MEDICATIONS:  Current Outpatient Medications  Medication Sig Dispense Refill   albuterol (PROAIR HFA) 108 (90 Base) MCG/ACT inhaler Inhale 1-2 puffs into the lungs every 6 (six) hours as needed for wheezing or shortness of breath. 8 g 2   buPROPion (WELLBUTRIN) 100 MG tablet Take 1 tablet (100 mg total) by mouth 2 (two) times daily. 60 tablet 2   Calcium Carbonate-Vit D-Min (CALCIUM 1200 PO) Take 1 tablet by mouth daily.     Cholecalciferol (VITAMIN D) 125 MCG (5000 UT) CAPS Take by mouth.     docusate sodium (COLACE) 100 MG capsule Take 100 mg by mouth 2 (two) times daily.     gabapentin (NEURONTIN) 300 MG capsule Take 1 capsule (300 mg total) by mouth 3 (three) times daily. 270 capsule 3   HYDROcodone-acetaminophen (NORCO/VICODIN) 5-325 MG tablet Take 1 tablet by  mouth 2 (two) times daily as needed for moderate pain. 60 tablet 0   HYDROcodone-acetaminophen (NORCO/VICODIN) 5-325 MG tablet Take 1 tablet by mouth 2 (two) times daily as needed for moderate pain (pain score 4-6). 60 tablet 0   HYDROcodone-acetaminophen (NORCO/VICODIN) 5-325 MG tablet Take 1 tablet by mouth 2 (two) times daily as needed for moderate pain (pain score 4-6). 60 tablet 0   mirtazapine (REMERON) 7.5 MG tablet Take 7.5 mg by mouth at bedtime.     nitroGLYCERIN (NITROSTAT) 0.4 MG SL tablet DISSOLVE 1 TABLET UNDER THE TONGUE EVERY 5 MINUTES AS  NEEDED FOR CHEST PAIN. MAX  OF 3 TABLETS IN 15 MINUTES. CALL 911 IF PAIN PERSISTS. 100 tablet 3   Prasterone, DHEA, 10 MG CAPS every other day.     progesterone (PROMETRIUM) 200 MG capsule Take 200 mg by mouth at bedtime.     rOPINIRole (REQUIP) 1 MG tablet Take 1 tablet (1 mg total) by mouth 3 (three) times daily. 90 tablet 1   rosuvastatin (CRESTOR) 10 MG tablet Take 1 tablet (10 mg total) by mouth daily. 90 tablet 3   traMADol (ULTRAM) 50 MG tablet Take 1 tablet by mouth twice daily as needed for pain 60 tablet 2   venlafaxine XR (EFFEXOR-XR) 75 MG 24 hr capsule TAKE 1 CAPSULE(75 MG) BY MOUTH DAILY WITH BREAKFAST 90 capsule 3   vitamin C (ASCORBIC ACID) 500 MG tablet Take 500 mg by mouth daily.     No current facility-administered medications for this visit.    PHYSICAL EXAMINATION: Vitals:   03/22/23 1120  BP: (!) 140/79  Pulse: (!) 150  Temp: 98.1 F (36.7 C)  SpO2: 100%   Filed Weights   03/22/23 1120  Weight: 129 lb (58.5 kg)    Physical Exam Vitals reviewed.  Constitutional:      Appearance: She is not ill-appearing.  HENT:     Head: Normocephalic and atraumatic.  Eyes:     General: No scleral icterus. Cardiovascular:     Rate and Rhythm: Normal rate and regular rhythm.  Pulmonary:     Effort: Pulmonary effort is normal. No respiratory distress.     Breath sounds:  No wheezing.  Abdominal:     General: There is no  distension.     Palpations: Abdomen is soft.     Tenderness: There is no abdominal tenderness. There is no guarding.  Musculoskeletal:        General: No tenderness.  Skin:    General: Skin is warm.     Coloration: Skin is not pale.  Neurological:     Mental Status: She is alert and oriented to person, place, and time.  Psychiatric:        Mood and Affect: Mood and affect normal.        Behavior: Behavior normal.    LABORATORY DATA:  I have reviewed the data as listed Lab Results  Component Value Date   WBC 7.2 03/22/2023   HGB 8.7 (L) 03/22/2023   HCT 28.3 (L) 03/22/2023   MCV 73.7 (L) 03/22/2023   PLT 281 03/22/2023   Recent Labs    05/02/22 1608 06/29/22 1313 07/18/22 0215 11/12/22 1503 01/11/23 1259 02/12/23 1558 02/20/23 0912  NA 137  --  137 139 137 139 139  K 4.0  --  3.8 4.2 4.3 4.5 4.5  CL 104  --  106 107 108 108 109  CO2 26  --  25 24 23 27 22   GLUCOSE 92  --  100* 88 121* 90 100*  BUN 14   < > 14 10 13 13 18   CREATININE 1.00   < > 0.88 0.87 0.81 0.98 0.89  CALCIUM 9.0  --  8.3* 8.9 8.7* 8.4 8.9  GFRNONAA  --    < > >60  --  >60  --  >60  PROT 6.2  --   --  6.4  --  5.9*  --   ALBUMIN 4.4  --   --  4.3  --  3.9  --   AST 11  --   --  14  --  19  --   ALT 10  --   --  15  --  19  --   ALKPHOS 71  --   --  78  --  87  --   BILITOT 0.6  --   --  1.0  --  1.2  --    < > = values in this interval not displayed.   No results found.  ASSESSMENT & PLAN:   # Chronic microcytic anemia/history of beta thalassemia - out of proportion to her anemia. S/p IV venofer x 4 in fall of 2017. Today, hemoglobin 8.7. s/p venofer x 3. Last 03/08/23. Hmg has not improved but not worse. Proceed with venofer today.    B12 Deficiency- last 02/22/23. On oral b12. Proceed with injection today. Recheck levels at next visit.    # AUG 2024- [colonoscopy; [facility]]- Anal nodule, biopsy- Low-grade squamous intraepithelial lesion (AIN 1); awaiting surgery NOV 5th, 2024 with Dr. Lucretia Roers at  [facility].    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 Patient continues to have abdominal pain, vomiting, and diarrhea with history of IBS-D. 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 Her testing was negative.    # DISPOSITION: Venofer & b12 1 mo- labs (cbc, cmp, ferritin, iron studies, b12), Dr Donneta Romberg, +/- venofer & b12- la  No problem-specific Assessment & Plan notes found for this encounter.  Alinda Dooms, NP 03/22/2023

## 2023-03-22 NOTE — Patient Instructions (Signed)
Iron Sucrose Injection What is this medication? IRON SUCROSE (EYE ern SOO krose) treats low levels of iron (iron deficiency anemia) in people with kidney disease. Iron is a mineral that plays an important role in making red blood cells, which carry oxygen from your lungs to the rest of your body. This medicine may be used for other purposes; ask your health care provider or pharmacist if you have questions. COMMON BRAND NAME(S): Venofer What should I tell my care team before I take this medication? They need to know if you have any of these conditions: Anemia not caused by low iron levels Heart disease High levels of iron in the blood Kidney disease Liver disease An unusual or allergic reaction to iron, other medications, foods, dyes, or preservatives Pregnant or trying to get pregnant Breastfeeding How should I use this medication? This medication is for infusion into a vein. It is given in a hospital or clinic setting. Talk to your care team about the use of this medication in children. While this medication may be prescribed for children as young as 2 years for selected conditions, precautions do apply. Overdosage: If you think you have taken too much of this medicine contact a poison control center or emergency room at once. NOTE: This medicine is only for you. Do not share this medicine with others. What if I miss a dose? Keep appointments for follow-up doses. It is important not to miss your dose. Call your care team if you are unable to keep an appointment. What may interact with this medication? Do not take this medication with any of the following: Deferoxamine Dimercaprol Other iron products This medication may also interact with the following: Chloramphenicol Deferasirox This list may not describe all possible interactions. Give your health care provider a list of all the medicines, herbs, non-prescription drugs, or dietary supplements you use. Also tell them if you smoke,  drink alcohol, or use illegal drugs. Some items may interact with your medicine. What should I watch for while using this medication? Visit your care team regularly. Tell your care team if your symptoms do not start to get better or if they get worse. You may need blood work done while you are taking this medication. You may need to follow a special diet. Talk to your care team. Foods that contain iron include: whole grains/cereals, dried fruits, beans, or peas, leafy green vegetables, and organ meats (liver, kidney). What side effects may I notice from receiving this medication? Side effects that you should report to your care team as soon as possible: Allergic reactions--skin rash, itching, hives, swelling of the face, lips, tongue, or throat Low blood pressure--dizziness, feeling faint or lightheaded, blurry vision Shortness of breath Side effects that usually do not require medical attention (report to your care team if they continue or are bothersome): Flushing Headache Joint pain Muscle pain Nausea Pain, redness, or irritation at injection site This list may not describe all possible side effects. Call your doctor for medical advice about side effects. You may report side effects to FDA at 1-800-FDA-1088. Where should I keep my medication? This medication is given in a hospital or clinic. It will not be stored at home. NOTE: This sheet is a summary. It may not cover all possible information. If you have questions about this medicine, talk to your doctor, pharmacist, or health care provider.  2024 Elsevier/Gold Standard (2022-10-19 00:00:00)

## 2023-03-26 ENCOUNTER — Ambulatory Visit: Payer: PPO | Admitting: Internal Medicine

## 2023-03-28 ENCOUNTER — Other Ambulatory Visit: Payer: PPO

## 2023-03-28 ENCOUNTER — Ambulatory Visit (INDEPENDENT_AMBULATORY_CARE_PROVIDER_SITE_OTHER): Payer: PPO | Admitting: Internal Medicine

## 2023-03-28 ENCOUNTER — Encounter: Payer: Self-pay | Admitting: Internal Medicine

## 2023-03-28 VITALS — BP 110/70 | HR 91 | Temp 97.8°F | Ht 63.0 in | Wt 129.0 lb

## 2023-03-28 DIAGNOSIS — G8929 Other chronic pain: Secondary | ICD-10-CM

## 2023-03-28 DIAGNOSIS — R1084 Generalized abdominal pain: Secondary | ICD-10-CM

## 2023-03-28 DIAGNOSIS — K638219 Small intestinal bacterial overgrowth, unspecified: Secondary | ICD-10-CM

## 2023-03-28 MED ORDER — AMITRIPTYLINE HCL 50 MG PO TABS: 50.0000 mg | ORAL_TABLET | Freq: Every day | ORAL | 1 refills | Status: DC

## 2023-03-28 MED ORDER — VENLAFAXINE HCL 37.5 MG PO TABS: 37.5000 mg | ORAL_TABLET | Freq: Two times a day (BID) | ORAL | 0 refills | Status: DC

## 2023-03-28 NOTE — Patient Instructions (Addendum)
We are weaning you off of venlafaxine  over a period of two weeks:  Reduce the dose effexor (venlafaxine) to 37.5 mg twice daily for one week, then once daily for one week ,  then stop   We are Starting amitriptyline at bedtime per GI.  Start with 50 mg one hour before bedtime   .  You may increase dose to 100 mg after 1 week   You may further increase dose  of amitriptyline  to 150 mg after one more week   Your GI doctor called in the rifaxamin on Oct 24 .  You can delay the start until you are on the amitriptyline and off of the venlafaxine by one week

## 2023-03-30 DIAGNOSIS — K638219 Small intestinal bacterial overgrowth, unspecified: Secondary | ICD-10-CM | POA: Insufficient documentation

## 2023-03-30 DIAGNOSIS — G8929 Other chronic pain: Secondary | ICD-10-CM | POA: Insufficient documentation

## 2023-03-30 NOTE — Assessment & Plan Note (Signed)
Presumed ,  by GI.  Patient was prescribed rifaxamin recently but has not started it yet

## 2023-03-30 NOTE — Assessment & Plan Note (Signed)
Trail of amitriptyline ,  requiring weaning off of venlafaxine

## 2023-04-02 DIAGNOSIS — A630 Anogenital (venereal) warts: Secondary | ICD-10-CM | POA: Diagnosis not present

## 2023-04-02 DIAGNOSIS — K6282 Dysplasia of anus: Secondary | ICD-10-CM | POA: Diagnosis not present

## 2023-04-03 ENCOUNTER — Other Ambulatory Visit
Admission: RE | Admit: 2023-04-03 | Discharge: 2023-04-03 | Disposition: A | Discharge status: Home / Self Care | Payer: PPO | Source: Ambulatory Visit | Attending: Oncology | Admitting: Oncology

## 2023-04-03 DIAGNOSIS — Z006 Encounter for examination for normal comparison and control in clinical research program: Secondary | ICD-10-CM | POA: Insufficient documentation

## 2023-04-09 ENCOUNTER — Other Ambulatory Visit: Payer: Self-pay | Admitting: Pharmacist

## 2023-04-09 NOTE — Progress Notes (Signed)
Pharmacy Quality Measure Review  This patient is appearing on a report for being at risk of failing the adherence measure for cholesterol (statin) medications this calendar year.   Medication: rosuvastatin 10 mg daily Last fill date: 11/08/22 for 90 day supply  Contacted pharmacy to facilitate refills. Spoke with patient, she realized she had missed refilling her rosuvastatin. Will pick up script.   Catie Eppie Gibson, PharmD, BCACP, CPP Clinical Pharmacist [facility] [phone]

## 2023-04-17 DIAGNOSIS — F32A Depression, unspecified: Secondary | ICD-10-CM | POA: Diagnosis not present

## 2023-04-17 DIAGNOSIS — I10 Essential (primary) hypertension: Secondary | ICD-10-CM | POA: Diagnosis not present

## 2023-04-17 DIAGNOSIS — D649 Anemia, unspecified: Secondary | ICD-10-CM | POA: Diagnosis not present

## 2023-04-17 DIAGNOSIS — J449 Chronic obstructive pulmonary disease, unspecified: Secondary | ICD-10-CM | POA: Diagnosis not present

## 2023-04-17 DIAGNOSIS — G4733 Obstructive sleep apnea (adult) (pediatric): Secondary | ICD-10-CM | POA: Diagnosis not present

## 2023-04-17 DIAGNOSIS — I739 Peripheral vascular disease, unspecified: Secondary | ICD-10-CM | POA: Diagnosis not present

## 2023-04-17 DIAGNOSIS — Z01818 Encounter for other preprocedural examination: Secondary | ICD-10-CM | POA: Diagnosis not present

## 2023-04-17 DIAGNOSIS — D561 Beta thalassemia: Secondary | ICD-10-CM | POA: Diagnosis not present

## 2023-04-19 DIAGNOSIS — E785 Hyperlipidemia, unspecified: Secondary | ICD-10-CM | POA: Diagnosis not present

## 2023-04-19 DIAGNOSIS — Z880 Allergy status to penicillin: Secondary | ICD-10-CM | POA: Diagnosis not present

## 2023-04-19 DIAGNOSIS — Z79899 Other long term (current) drug therapy: Secondary | ICD-10-CM | POA: Diagnosis not present

## 2023-04-19 DIAGNOSIS — Z882 Allergy status to sulfonamides status: Secondary | ICD-10-CM | POA: Diagnosis not present

## 2023-04-19 DIAGNOSIS — D561 Beta thalassemia: Secondary | ICD-10-CM | POA: Diagnosis not present

## 2023-04-19 DIAGNOSIS — Z803 Family history of malignant neoplasm of breast: Secondary | ICD-10-CM | POA: Diagnosis not present

## 2023-04-19 DIAGNOSIS — Z90710 Acquired absence of both cervix and uterus: Secondary | ICD-10-CM | POA: Diagnosis not present

## 2023-04-19 DIAGNOSIS — K6282 Dysplasia of anus: Secondary | ICD-10-CM | POA: Diagnosis not present

## 2023-04-19 DIAGNOSIS — I728 Aneurysm of other specified arteries: Secondary | ICD-10-CM | POA: Diagnosis not present

## 2023-04-19 DIAGNOSIS — N189 Chronic kidney disease, unspecified: Secondary | ICD-10-CM | POA: Diagnosis not present

## 2023-04-19 DIAGNOSIS — R85612 Low grade squamous intraepithelial lesion on cytologic smear of anus (LGSIL): Secondary | ICD-10-CM | POA: Diagnosis not present

## 2023-04-19 DIAGNOSIS — I129 Hypertensive chronic kidney disease with stage 1 through stage 4 chronic kidney disease, or unspecified chronic kidney disease: Secondary | ICD-10-CM | POA: Diagnosis not present

## 2023-04-19 DIAGNOSIS — A630 Anogenital (venereal) warts: Secondary | ICD-10-CM | POA: Diagnosis not present

## 2023-04-19 DIAGNOSIS — J439 Emphysema, unspecified: Secondary | ICD-10-CM | POA: Diagnosis not present

## 2023-04-19 DIAGNOSIS — K5730 Diverticulosis of large intestine without perforation or abscess without bleeding: Secondary | ICD-10-CM | POA: Diagnosis not present

## 2023-04-19 DIAGNOSIS — I739 Peripheral vascular disease, unspecified: Secondary | ICD-10-CM | POA: Diagnosis not present

## 2023-04-19 DIAGNOSIS — G4733 Obstructive sleep apnea (adult) (pediatric): Secondary | ICD-10-CM | POA: Diagnosis not present

## 2023-04-19 DIAGNOSIS — F17210 Nicotine dependence, cigarettes, uncomplicated: Secondary | ICD-10-CM | POA: Diagnosis not present

## 2023-04-19 DIAGNOSIS — I708 Atherosclerosis of other arteries: Secondary | ICD-10-CM | POA: Diagnosis not present

## 2023-04-19 DIAGNOSIS — K644 Residual hemorrhoidal skin tags: Secondary | ICD-10-CM | POA: Diagnosis not present

## 2023-04-19 DIAGNOSIS — I2510 Atherosclerotic heart disease of native coronary artery without angina pectoris: Secondary | ICD-10-CM | POA: Diagnosis not present

## 2023-04-19 DIAGNOSIS — F32A Depression, unspecified: Secondary | ICD-10-CM | POA: Diagnosis not present

## 2023-04-22 ENCOUNTER — Other Ambulatory Visit: Payer: Self-pay | Admitting: *Deleted

## 2023-04-22 DIAGNOSIS — D508 Other iron deficiency anemias: Secondary | ICD-10-CM

## 2023-04-22 DIAGNOSIS — D563 Thalassemia minor: Secondary | ICD-10-CM

## 2023-04-23 ENCOUNTER — Inpatient Hospital Stay (HOSPITAL_BASED_OUTPATIENT_CLINIC_OR_DEPARTMENT_OTHER): Payer: PPO | Admitting: Nurse Practitioner

## 2023-04-23 ENCOUNTER — Inpatient Hospital Stay: Payer: PPO | Attending: Internal Medicine

## 2023-04-23 ENCOUNTER — Encounter: Payer: Self-pay | Admitting: Nurse Practitioner

## 2023-04-23 ENCOUNTER — Inpatient Hospital Stay: Payer: PPO

## 2023-04-23 VITALS — BP 130/82 | HR 91 | Temp 98.2°F | Wt 128.0 lb

## 2023-04-23 DIAGNOSIS — D508 Other iron deficiency anemias: Secondary | ICD-10-CM

## 2023-04-23 DIAGNOSIS — D509 Iron deficiency anemia, unspecified: Secondary | ICD-10-CM | POA: Insufficient documentation

## 2023-04-23 DIAGNOSIS — E538 Deficiency of other specified B group vitamins: Secondary | ICD-10-CM | POA: Diagnosis not present

## 2023-04-23 DIAGNOSIS — Z79899 Other long term (current) drug therapy: Secondary | ICD-10-CM | POA: Insufficient documentation

## 2023-04-23 DIAGNOSIS — F17210 Nicotine dependence, cigarettes, uncomplicated: Secondary | ICD-10-CM | POA: Diagnosis not present

## 2023-04-23 DIAGNOSIS — D563 Thalassemia minor: Secondary | ICD-10-CM

## 2023-04-23 LAB — CBC WITH DIFFERENTIAL (CANCER CENTER ONLY)
Abs Immature Granulocytes: 0.04 10*3/uL (ref 0.00–0.07)
Basophils Absolute: 0.1 10*3/uL (ref 0.0–0.1)
Basophils Relative: 1 %
Eosinophils Absolute: 0.1 10*3/uL (ref 0.0–0.5)
Eosinophils Relative: 1 %
HCT: 31.1 % — ABNORMAL LOW (ref 36.0–46.0)
Hemoglobin: 9.7 g/dL — ABNORMAL LOW (ref 12.0–15.0)
Immature Granulocytes: 1 %
Lymphocytes Relative: 27 %
Lymphs Abs: 2.2 10*3/uL (ref 0.7–4.0)
MCH: 22.9 pg — ABNORMAL LOW (ref 26.0–34.0)
MCHC: 31.2 g/dL (ref 30.0–36.0)
MCV: 73.5 fL — ABNORMAL LOW (ref 80.0–100.0)
Monocytes Absolute: 0.5 10*3/uL (ref 0.1–1.0)
Monocytes Relative: 6 %
Neutro Abs: 5.2 10*3/uL (ref 1.7–7.7)
Neutrophils Relative %: 64 %
Platelet Count: 297 10*3/uL (ref 150–400)
RBC: 4.23 MIL/uL (ref 3.87–5.11)
RDW: 18 % — ABNORMAL HIGH (ref 11.5–15.5)
WBC Count: 8.1 10*3/uL (ref 4.0–10.5)
nRBC: 0.7 % — ABNORMAL HIGH (ref 0.0–0.2)

## 2023-04-23 LAB — IRON AND TIBC
Iron: 84 ug/dL (ref 28–170)
Saturation Ratios: 25 % (ref 10.4–31.8)
TIBC: 340 ug/dL (ref 250–450)
UIBC: 256 ug/dL

## 2023-04-23 LAB — CMP (CANCER CENTER ONLY)
ALT: 19 U/L (ref 0–44)
AST: 20 U/L (ref 15–41)
Albumin: 4.7 g/dL (ref 3.5–5.0)
Alkaline Phosphatase: 90 U/L (ref 38–126)
Anion gap: 12 (ref 5–15)
BUN: 16 mg/dL (ref 8–23)
CO2: 22 mmol/L (ref 22–32)
Calcium: 9.1 mg/dL (ref 8.9–10.3)
Chloride: 105 mmol/L (ref 98–111)
Creatinine: 1.18 mg/dL — ABNORMAL HIGH (ref 0.44–1.00)
GFR, Estimated: 51 mL/min — ABNORMAL LOW (ref >60)
Glucose, Bld: 111 mg/dL — ABNORMAL HIGH (ref 70–99)
Potassium: 4.2 mmol/L (ref 3.5–5.1)
Sodium: 139 mmol/L (ref 135–145)
Total Bilirubin: 1.4 mg/dL — ABNORMAL HIGH (ref <1.2)
Total Protein: 7.3 g/dL (ref 6.5–8.1)

## 2023-04-23 LAB — VITAMIN B12: Vitamin B-12: 461 pg/mL (ref 180–914)

## 2023-04-23 LAB — FERRITIN: Ferritin: 386 ng/mL — ABNORMAL HIGH (ref 11–307)

## 2023-04-23 MED ORDER — CYANOCOBALAMIN 1000 MCG/ML IJ SOLN
1000.0000 ug | Freq: Once | INTRAMUSCULAR | Status: AC
Start: 2023-04-23 — End: 2023-04-23
  Administered 2023-04-23: 1000 ug via INTRAMUSCULAR
  Filled 2023-04-23: qty 1

## 2023-04-23 NOTE — Progress Notes (Signed)
[facility] CONSULT NOTE  Patient Care Team: Sherlene Shams, MD as PCP - General (Internal Medicine) Lemar Livings Merrily Pew, MD as Consulting Physician (General Surgery) Earna Coder, MD as Consulting Physician (Oncology)  CHIEF COMPLAINTS/PURPOSE OF CONSULTATION: Anemia.  HEMATOLOGY HISTORY  # CHRONIC MICROCYTIC ANEMIA- BETA THALASSEMIA MINOR [colo- 2016; Dr. Lemar Livings July 2017- IV trial of Venofer x4   # Lung nodules [CT- sub cm Jan 2017]- 2019 CT scan stable.  Benign.  HISTORY OF PRESENTING ILLNESS: Alone. Walking independently.   Cheryl [name] 67 y.o.  female with a history of beta thalassemia minor; also iron deficiency anemia/B12 deficiency is here for follow-up. She has just undergone surgery for anal lesions. Pathology pending. She has chronic shortness of breath with exertion which she has accounted to her COPD. She has fatigue which is unchanged.  No black or bloody stools. Denies unintentional weight loss.   Review of Systems  Constitutional:  Positive for malaise/fatigue. Negative for chills, diaphoresis, fever and weight loss.  Respiratory:  Positive for shortness of breath. Negative for cough, hemoptysis, sputum production and wheezing.   Cardiovascular:  Negative for chest pain, palpitations, orthopnea and leg swelling.  Gastrointestinal:  Negative for abdominal pain, blood in stool, constipation, diarrhea, heartburn, melena, nausea and vomiting.  Genitourinary:  Negative for dysuria, frequency, hematuria and urgency.  Musculoskeletal:  Negative for back pain, falls and joint pain.  Skin:  Negative for itching and rash.  Neurological:  Negative for dizziness, tingling, focal weakness, weakness and headaches.  Endo/Heme/Allergies:  Does not bruise/bleed easily.  Psychiatric/Behavioral:  Positive for substance abuse. Negative for depression. The patient is not nervous/anxious and does not have insomnia.     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   Arrhythmia    left bundle branch block   Arthritis    Blood in stool    Chicken pox    Generalized headaches    History of blood transfusion    Kidney disease, chronic, stage III (GFR 30-59 ml/min) (HCC)    LBBB (left bundle branch block)    Renal disorder    Right lateral epicondylitis 08/13/2017   Thyroid disease    UTI (lower urinary tract infection)     SURGICAL HISTORY: Past Surgical History:  Procedure Laterality Date   ABDOMINAL HYSTERECTOMY  1997   APPENDECTOMY  1981   BREAST EXCISIONAL BIOPSY Bilateral 1976   neg   BREAST SURGERY  1976   CARDIAC CATHETERIZATION  2004   [facility]   CARDIAC CATHETERIZATION  2006   [facility]   cystic fibrosis tumor removal  1983   THUMB AMPUTATION  1992   traumatic   TONSILLECTOMY AND ADENOIDECTOMY  1964   VISCERAL ANGIOGRAPHY N/A 06/29/2022   Procedure: VISCERAL ANGIOGRAPHY;  Surgeon: Renford Dills, MD;  Location: [facility] INVASIVE CV LAB;  Service: Cardiovascular;  Laterality: N/A;   VISCERAL ANGIOGRAPHY N/A 07/17/2022   Procedure: VISCERAL ANGIOGRAPHY;  Surgeon: Renford Dills, MD;  Location: [facility] INVASIVE CV LAB;  Service: Cardiovascular;  Laterality: N/A;    SOCIAL HISTORY: Social History   Socioeconomic History   Marital status: Married    Spouse name: Not on file   Number of children: 3   Years of education: Not on file   Highest education level: Not on  file  Occupational History   Occupation: Retired  Tobacco Use   Smoking status: Every Day    Current packs/day: 0.25    Average packs/day: 0.3 packs/day for 44.0 years (11.0 ttl pk-yrs)    Types: Cigarettes    Start date: 04/11/1979   Smokeless tobacco: Never  Vaping Use   Vaping status: Never Used  Substance and Sexual Activity    Alcohol use: Yes    Comment: occasional   Drug use: No   Sexual activity: Not on file  Other Topics Concern   Not on file  Social History Narrative   Married to [employer]; Works for [employer], Engineer, structural. Quit smoking 2018; ocassional alcohol; lives near to [place].    Social Determinants of Health   Financial Resource Strain: Low Risk  (10/15/2022)   Overall Financial Resource Strain (CARDIA)    Difficulty of Paying Living Expenses: Not hard at all  Food Insecurity: No Food Insecurity (10/15/2022)   Hunger Vital Sign    Worried About Running Out of Food in the Last Year: Never true    Ran Out of Food in the Last Year: Never true  Transportation Needs: No Transportation Needs (10/15/2022)   PRAPARE - Administrator, Civil Service (Medical): No    Lack of Transportation (Non-Medical): No  Physical Activity: Inactive (10/15/2022)   Exercise Vital Sign    Days of Exercise per Week: 0 days    Minutes of Exercise per Session: 0 min  Stress: No Stress Concern Present (10/15/2022)   [employer] of Occupational Health - Occupational Stress Questionnaire    Feeling of Stress : Not at all  Social Connections: Moderately Integrated (10/15/2022)   Social Connection and Isolation Panel [NHANES]    Frequency of Communication with Friends and Family: More than three times a week    Frequency of Social Gatherings with Friends and Family: Three times a week    Attends Religious Services: Never    Active Member of Clubs or Organizations: Yes    Attends Banker Meetings: More than 4 times per year    Marital Status: Married  Catering manager Violence: Not At Risk (10/15/2022)   Humiliation, Afraid, Rape, and Kick questionnaire    Fear of Current or Ex-Partner: No    Emotionally Abused: No    Physically Abused: No    Sexually Abused: No    FAMILY HISTORY: Family History  Problem Relation Age of Onset   Heart disease Mother    Arthritis Mother     Cancer Mother        breast   Hyperlipidemia Mother    Hypertension Mother    Heart attack Mother    Breast cancer Mother 70   Heart disease Father    Diabetes Father    Arthritis Father    Hypertension Father    Parkinson's disease Father    Hodgkin's lymphoma Father        hodgkins disease, prostate   Heart disease Sister    Diabetes Sister    Diabetes Maternal Aunt    Heart disease Maternal Grandmother    Diabetes Maternal Grandmother    Heart disease Maternal Grandfather    Heart disease Paternal Grandmother    Diabetes Paternal Grandmother    Stroke Paternal Grandfather    Heart disease Paternal Grandfather    Diabetes Paternal Grandfather    Breast cancer Cousin        2 pat cousins   Heart disease Brother 80  ami x 8,  4 vessel CABG    Diabetes Brother    Liver disease Brother    Lung disease Brother     ALLERGIES:  is allergic to peanuts [peanut oil], penicillins, sulfa antibiotics, corticosteroids, influenza vac split [influenza virus vaccine], mobic [meloxicam], nickel, nsaids, penicillin g, and prednisone.  MEDICATIONS:  Current Outpatient Medications  Medication Sig Dispense Refill   albuterol (PROAIR HFA) 108 (90 Base) MCG/ACT inhaler Inhale 1-2 puffs into the lungs every 6 (six) hours as needed for wheezing or shortness of breath. 8 g 2   amitriptyline (ELAVIL) 50 MG tablet Take 1 tablet (50 mg total) by mouth at bedtime. 90 tablet 1   buPROPion (WELLBUTRIN) 100 MG tablet Take 1 tablet (100 mg total) by mouth 2 (two) times daily. 60 tablet 2   Calcium Carbonate-Vit D-Min (CALCIUM 1200 PO) Take 1 tablet by mouth daily.     Cholecalciferol (VITAMIN D) 125 MCG (5000 UT) CAPS Take by mouth.     docusate sodium (COLACE) 100 MG capsule Take 100 mg by mouth 2 (two) times daily.     gabapentin (NEURONTIN) 300 MG capsule Take 1 capsule (300 mg total) by mouth 3 (three) times daily. 270 capsule 3   HYDROcodone-acetaminophen (NORCO/VICODIN) 5-325 MG tablet Take 1  tablet by mouth 2 (two) times daily as needed for moderate pain. 60 tablet 0   HYDROcodone-acetaminophen (NORCO/VICODIN) 5-325 MG tablet Take 1 tablet by mouth 2 (two) times daily as needed for moderate pain (pain score 4-6). 60 tablet 0   HYDROcodone-acetaminophen (NORCO/VICODIN) 5-325 MG tablet Take 1 tablet by mouth 2 (two) times daily as needed for moderate pain (pain score 4-6). 60 tablet 0   mirtazapine (REMERON) 7.5 MG tablet Take 7.5 mg by mouth at bedtime.     nitroGLYCERIN (NITROSTAT) 0.4 MG SL tablet DISSOLVE 1 TABLET UNDER THE TONGUE EVERY 5 MINUTES AS  NEEDED FOR CHEST PAIN. MAX  OF 3 TABLETS IN 15 MINUTES. CALL 911 IF PAIN PERSISTS. 100 tablet 3   Prasterone, DHEA, 10 MG CAPS every other day.     progesterone (PROMETRIUM) 200 MG capsule Take 200 mg by mouth at bedtime.     rOPINIRole (REQUIP) 1 MG tablet Take 1 tablet (1 mg total) by mouth 3 (three) times daily. 90 tablet 1   rosuvastatin (CRESTOR) 10 MG tablet Take 1 tablet (10 mg total) by mouth daily. 90 tablet 3   traMADol (ULTRAM) 50 MG tablet Take 1 tablet by mouth twice daily as needed for pain 60 tablet 2   venlafaxine (EFFEXOR) 37.5 MG tablet Take 1 tablet (37.5 mg total) by mouth 2 (two) times daily. For one week,  then once daily for one week then stop 30 tablet 0   vitamin C (ASCORBIC ACID) 500 MG tablet Take 500 mg by mouth daily.     No current facility-administered medications for this visit.    PHYSICAL EXAMINATION: Vitals:   04/23/23 1409  BP: 130/82  Pulse: 91  Temp: 98.2 F (36.8 C)  SpO2: 99%   Filed Weights   04/23/23 1409  Weight: 128 lb (58.1 kg)    Physical Exam Constitutional:      Appearance: She is not ill-appearing.  Cardiovascular:     Rate and Rhythm: Normal rate and regular rhythm.  Pulmonary:     Effort: No respiratory distress.  Abdominal:     General: There is no distension.     Palpations: Abdomen is soft.     Tenderness: There is no  guarding.  Musculoskeletal:        General:  No tenderness.     Comments: Ambulating w/o aids  Skin:    General: Skin is warm.     Coloration: Skin is pale.  Neurological:     Mental Status: She is alert and oriented to person, place, and time.  Psychiatric:        Mood and Affect: Mood and affect normal.        Behavior: Behavior normal.    LABORATORY DATA:  I have reviewed the data as listed Lab Results  Component Value Date   WBC 8.1 04/23/2023   HGB 9.7 (L) 04/23/2023   HCT 31.1 (L) 04/23/2023   MCV 73.5 (L) 04/23/2023   PLT 297 04/23/2023   Recent Labs    11/12/22 1503 01/11/23 1259 02/12/23 1558 02/20/23 0912 04/23/23 1353  NA 139 137 139 139 139  K 4.2 4.3 4.5 4.5 4.2  CL 107 108 108 109 105  CO2 24 23 27 22 22   GLUCOSE 88 121* 90 100* 111*  BUN 10 13 13 18 16   CREATININE 0.87 0.81 0.98 0.89 1.18*  CALCIUM 8.9 8.7* 8.4 8.9 9.1  GFRNONAA  --  >60  --  >60 51*  PROT 6.4  --  5.9*  --  7.3  ALBUMIN 4.3  --  3.9  --  4.7  AST 14  --  19  --  20  ALT 15  --  19  --  19  ALKPHOS 78  --  87  --  90  BILITOT 1.0  --  1.2  --  1.4*   Iron/TIBC/Ferritin/ %Sat    Component Value Date/Time   IRON 84 04/23/2023 1353   IRON 85 11/25/2019 1232   IRON 77 09/01/2014 0459   TIBC 340 04/23/2023 1353   TIBC 270 11/25/2019 1232   TIBC 318 09/01/2014 0459   FERRITIN 386 (H) 04/23/2023 1353   FERRITIN 339 (H) 11/25/2019 1232   FERRITIN 124 09/01/2014 0459   IRONPCTSAT 25 04/23/2023 1353   IRONPCTSAT 31 11/25/2019 1232   IRONPCTSAT 24.2 09/01/2014 0459     No results found.  ASSESSMENT & PLAN:   Thalassemia minor # Chronic microcytic anemia/history of beta thalassemia - out of proportion to her anemia. S/p IV venofer x 4 in fall of 2017.   # Today hemoglobin is 9.7. Improving. Last received venofer 03/22/23 along with b12 injection. HOLD Venofer.   # AUG 2024- [colonoscopy; [facility]]- Anus nodule, biopsy:-Low-grade squamous intraepithelial lesion (AIN 1). S/p surgery on 04/19/23 with Dr. Eliezer Mccoy. Pathology  pending.    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 Patient continues to have abdominal pain, vomiting, and diarrhea. 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 Her workup was negative.    # B12 deficiency - on PO B12 but has poor absorption. Plan for b12 injection today and monthly. B12 level today is pending.   # Tobacco Use- recommend ref to LDCT screening program. Was referred by pcp in June of this year. Encouraged patient to follow up for imaging.    DISPOSITION: Today- b12. Hold venofer.  1 mo- b12 injection 2 mo- labs (cbc, cmp, ferritin, iron studies, b12), Dr Donneta Romberg, +/- venofer & b12- la   No problem-specific Assessment & Plan notes found for this encounter.  Alinda Dooms, NP 04/23/2023

## 2023-05-08 ENCOUNTER — Encounter: Payer: Self-pay | Admitting: Internal Medicine

## 2023-05-08 ENCOUNTER — Ambulatory Visit: Payer: PPO | Admitting: Internal Medicine

## 2023-05-08 VITALS — BP 118/62 | HR 89 | Temp 98.0°F | Resp 18 | Ht 63.0 in | Wt 131.1 lb

## 2023-05-08 DIAGNOSIS — A630 Anogenital (venereal) warts: Secondary | ICD-10-CM

## 2023-05-08 DIAGNOSIS — R0609 Other forms of dyspnea: Secondary | ICD-10-CM

## 2023-05-08 DIAGNOSIS — I739 Peripheral vascular disease, unspecified: Secondary | ICD-10-CM | POA: Diagnosis not present

## 2023-05-08 DIAGNOSIS — I129 Hypertensive chronic kidney disease with stage 1 through stage 4 chronic kidney disease, or unspecified chronic kidney disease: Secondary | ICD-10-CM

## 2023-05-08 DIAGNOSIS — I774 Celiac artery compression syndrome: Secondary | ICD-10-CM | POA: Diagnosis not present

## 2023-05-08 DIAGNOSIS — K638219 Small intestinal bacterial overgrowth, unspecified: Secondary | ICD-10-CM | POA: Diagnosis not present

## 2023-05-08 MED ORDER — ISOSORBIDE MONONITRATE ER 30 MG PO TB24: 30.0000 mg | ORAL_TABLET | Freq: Every day | ORAL | 1 refills | Status: DC

## 2023-05-08 MED ORDER — NITROGLYCERIN 0.4 MG SL SUBL: 0.4000 mg | SUBLINGUAL_TABLET | SUBLINGUAL | 3 refills | Status: AC | PRN

## 2023-05-08 MED ORDER — BUPROPION HCL 100 MG PO TABS: 150.0000 mg | ORAL_TABLET | Freq: Two times a day (BID) | ORAL | 1 refills | Status: DC

## 2023-05-08 MED ORDER — ONDANSETRON HCL 8 MG PO TABS: 8.0000 mg | ORAL_TABLET | Freq: Three times a day (TID) | ORAL | 1 refills | Status: DC | PRN

## 2023-05-08 MED ORDER — OMEPRAZOLE 40 MG PO CPDR: 40.0000 mg | DELAYED_RELEASE_CAPSULE | Freq: Every day | ORAL | 1 refills | Status: DC

## 2023-05-08 MED ORDER — CARVEDILOL 3.125 MG PO TABS: 3.1250 mg | ORAL_TABLET | Freq: Two times a day (BID) | ORAL | 3 refills | Status: DC

## 2023-05-08 NOTE — Assessment & Plan Note (Signed)
S/p excision under general anesthesia Apr 19 2023,  [place] . Surveillance recommended  every 6 to 12 months

## 2023-05-08 NOTE — Patient Instructions (Addendum)
I am starting  you on Imdur, this is a long acting nitrate that helps dilate blood vessels in the heart and other places  I AM ALSO STARTING YOU ON CARVEDILOL FOR YOUR HEART  AND BLOOD PRESSURE.  TAKE IT EVERY 12 HOURS  INCREASE THE WELLBUTRIN TO 1.5 TABLETS TWICE DAILY    If you develop another episode  of chest pain, TAKE A SUBLINGUAL NITGROYCERINE TABLET ,  AND  call 911 and go to the nearest hospital

## 2023-05-08 NOTE — Assessment & Plan Note (Signed)
Continue aggressive management of blood pressure and cholesterol with losartan,  Toprol XL  and Rosuvastatin. She is trying to  quit smoking . Adding imdur  for chest pain/anginal equivalent. Has follow up with Vascular at the end of the month for persistent celiac artery stenosis   Lab Results  Component Value Date   CHOL 89 11/12/2022   HDL 31.60 (L) 11/12/2022   LDLCALC 41 11/12/2022   LDLDIRECT 58.0 05/02/2022   TRIG 83.0 11/12/2022   CHOLHDL 3 11/12/2022

## 2023-05-08 NOTE — Progress Notes (Addendum)
Subjective:  Patient ID: Cheryl [name], female    DOB: 13-May-1956  Age: 67 y.o. MRN: [id number]  CC: The primary encounter diagnosis was Anal warts. Diagnoses of Celiac artery stenosis (HCC), Dyspnea on exertion, Small intestinal bacterial overgrowth (SIBO), Renal hypertension, and PAD (peripheral artery disease) (HCC) were also pertinent to this visit.   HPI Cheryl [name] presents for  Chief Complaint  Patient presents with   Medical Management of Chronic Issues    4 week follow up     1) chronic abd pain/diarrhea:  h/o persistent  celiac artery stenosis despite MALS release in April 2024.  Saw [facility] [place],  last OV 03/21/2023,  does not want to return to him due to his demeanor.   rifaxamin  completed,   no change in symptoms .     remeron stopped and elavil started after effexor wean in late October .  Pain improved a little.    Weight has been stable since July, but   the diarrhea has persisted  despite regular use of Imodium for the past year. Fecal incontinence resolved.    She has constant nausea even in a fasting state.  Vomits 1-2 times per week,  has restricted die to soup and protein shakes/ice cream   and gatorade.  No longer taking a PPI for unclear reasons  (even though recent EGD noted gastritis).  And stopped 4 mg zofran because it didn't work   2) LSIL anus: s/p excision under gen anesthesia Nov 22 [facility].  No vulvar lesion per op report    3) chronic back pain management with hydrocodone bid.   4) cc  short of breath and vertigo occur with daytime acitivites,  just walking to the kitchen,  has been occurring since her MALS surgery in April 2024  also reports that several weeks ago she had bilateral  and SSCP  that lasted several  days.    Stil smoking but trying to quit.   Outpatient Medications Prior to Visit  Medication Sig Dispense Refill   albuterol (PROAIR HFA) 108 (90 Base) MCG/ACT inhaler Inhale 1-2 puffs into the lungs every 6 (six) hours as  needed for wheezing or shortness of breath. 8 g 2   amitriptyline (ELAVIL) 50 MG tablet Take 1 tablet (50 mg total) by mouth at bedtime. 90 tablet 1   Calcium Carbonate-Vit D-Min (CALCIUM 1200 PO) Take 1 tablet by mouth daily.     Cholecalciferol (VITAMIN D) 125 MCG (5000 UT) CAPS Take by mouth.     docusate sodium (COLACE) 100 MG capsule Take 100 mg by mouth 2 (two) times daily.     gabapentin (NEURONTIN) 300 MG capsule Take 1 capsule (300 mg total) by mouth 3 (three) times daily. 270 capsule 3   HYDROcodone-acetaminophen (NORCO/VICODIN) 5-325 MG tablet Take 1 tablet by mouth 2 (two) times daily as needed for moderate pain (pain score 4-6). 60 tablet 0   HYDROcodone-acetaminophen (NORCO/VICODIN) 5-325 MG tablet Take 1 tablet by mouth 2 (two) times daily as needed for moderate pain (pain score 4-6). 60 tablet 0   Prasterone, DHEA, 10 MG CAPS every other day.     progesterone (PROMETRIUM) 200 MG capsule Take 200 mg by mouth at bedtime.     rOPINIRole (REQUIP) 1 MG tablet Take 1 tablet (1 mg total) by mouth 3 (three) times daily. 90 tablet 1   rosuvastatin (CRESTOR) 10 MG tablet Take 1 tablet (10 mg total) by mouth daily. 90 tablet 3  traMADol (ULTRAM) 50 MG tablet Take 1 tablet by mouth twice daily as needed for pain 60 tablet 2   vitamin C (ASCORBIC ACID) 500 MG tablet Take 500 mg by mouth daily.     buPROPion (WELLBUTRIN) 100 MG tablet Take 1 tablet (100 mg total) by mouth 2 (two) times daily. 60 tablet 2   HYDROcodone-acetaminophen (NORCO/VICODIN) 5-325 MG tablet Take 1 tablet by mouth 2 (two) times daily as needed for moderate pain. 60 tablet 0   mirtazapine (REMERON) 7.5 MG tablet Take 7.5 mg by mouth at bedtime.     nitroGLYCERIN (NITROSTAT) 0.4 MG SL tablet DISSOLVE 1 TABLET UNDER THE TONGUE EVERY 5 MINUTES AS  NEEDED FOR CHEST PAIN. MAX  OF 3 TABLETS IN 15 MINUTES. CALL 911 IF PAIN PERSISTS. 100 tablet 3   venlafaxine (EFFEXOR) 37.5 MG tablet Take 1 tablet (37.5 mg total) by mouth 2 (two)  times daily. For one week,  then once daily for one week then stop 30 tablet 0   No facility-administered medications prior to visit.    Review of Systems;  Patient denies headache, fevers, malaise, unintentional weight loss, skin rash, eye pain, sinus congestion and sinus pain, sore throat, dysphagia,  hemoptysis , cough, dyspnea, wheezing, chest pain, palpitations, orthopnea, edema, abdominal pain, nausea, melena, diarrhea, constipation, flank pain, dysuria, hematuria, urinary  Frequency, nocturia, numbness, tingling, seizures,  Focal weakness, Loss of consciousness,  Tremor, insomnia, depression, anxiety, and suicidal ideation.      Objective:  BP 118/62   Pulse 89   Temp 98 F (36.7 C)   Resp 18   Ht 5\' 3"  (1.6 m)   Wt 131 lb 2 oz (59.5 kg)   SpO2 96%   BMI 23.23 kg/m   BP Readings from Last 3 Encounters:  05/08/23 118/62  04/23/23 130/82  03/28/23 110/70    Wt Readings from Last 3 Encounters:  05/08/23 131 lb 2 oz (59.5 kg)  04/23/23 128 lb (58.1 kg)  03/28/23 129 lb (58.5 kg)    Physical Exam Vitals reviewed.  Constitutional:      General: She is not in acute distress.    Appearance: Normal appearance. She is normal weight. She is not ill-appearing, toxic-appearing or diaphoretic.  HENT:     Head: Normocephalic.  Eyes:     General: No scleral icterus.       Right eye: No discharge.        Left eye: No discharge.     Conjunctiva/sclera: Conjunctivae normal.  Cardiovascular:     Rate and Rhythm: Normal rate and regular rhythm.     Heart sounds: Normal heart sounds.  Pulmonary:     Effort: Pulmonary effort is normal. No respiratory distress.     Breath sounds: Normal breath sounds.  Musculoskeletal:        General: Normal range of motion.  Skin:    General: Skin is warm and dry.  Neurological:     General: No focal deficit present.     Mental Status: She is alert and oriented to person, place, and time. Mental status is at baseline.  Psychiatric:         Mood and Affect: Mood normal.        Behavior: Behavior normal.        Thought Content: Thought content normal.        Judgment: Judgment normal.    Lab Results  Component Value Date   HGBA1C 5.5 05/02/2022   HGBA1C 5.1 10/26/2015   HGBA1C  5.4 11/25/2013    Lab Results  Component Value Date   CREATININE 1.18 (H) 04/23/2023   CREATININE 0.89 02/20/2023   CREATININE 0.98 02/12/2023    Lab Results  Component Value Date   WBC 8.1 04/23/2023   HGB 9.7 (L) 04/23/2023   HCT 31.1 (L) 04/23/2023   PLT 297 04/23/2023   GLUCOSE 111 (H) 04/23/2023   CHOL 89 11/12/2022   TRIG 83.0 11/12/2022   HDL 31.60 (L) 11/12/2022   LDLDIRECT 58.0 05/02/2022   LDLCALC 41 11/12/2022   ALT 19 04/23/2023   AST 20 04/23/2023   NA 139 04/23/2023   K 4.2 04/23/2023   CL 105 04/23/2023   CREATININE 1.18 (H) 04/23/2023   BUN 16 04/23/2023   CO2 22 04/23/2023   TSH 0.76 11/12/2022   INR 1.1 07/17/2022   HGBA1C 5.5 05/02/2022   MICROALBUR 1.1 05/02/2022    No results found.  Assessment & Plan:  .Anal warts Assessment & Plan: S/p excision under general anesthesia Apr 19 2023,  [place] . Surveillance recommended  every 6 to 12 months    Celiac artery stenosis [facility]) Assessment & Plan: S.p MALS surgery AT [facility] April 2024. Still smoking ,  still with cervical artery stenosis by last mesenteric doppler done at [facility] July 2024.    Dyspnea on exertion Assessment & Plan: Concerned that this is an anginal equivalent.  Ambulatory sats did not drop in office with 2 minute walk  and pulse remained in the high 80's.  She is not orthostatic.  High likelihood of critical CAD given her PAD and ongoing tobacco abuse.  I have ordered and reviewed a 12 lead EKG and find that there are no acute changes and patient is in sinus rhythm with a preexistent LBBB , cardiology follow up ASAP.  Sdding Imdur and carvediilol,  refilled expired Sl ntg.  Cardiology appt dec 16 . Patient advised to take SL NTG prn chest pain and  call 911 if not resolved after 1 dose    Orders: -     EKG 12-Lead -     Ambulatory referral to Cardiology  Small intestinal bacterial overgrowth (SIBO) Assessment & Plan: Presumed ,  by GI.  Patient was prescribed rifaxamin recently but has had no improvement in her diarrhea or nausea    Renal hypertension Assessment & Plan: Elevated today on  losartan . Adding Imdur and carvedilol due to concer for angina of effort and elevated reading in office (my reading of 150 systolic )  Lab Results  Component Value Date   NA 139 04/23/2023   K 4.2 04/23/2023   CL 105 04/23/2023   CO2 22 04/23/2023      PAD (peripheral artery disease) (HCC) Assessment & Plan: Continue aggressive management of blood pressure and cholesterol with losartan,  Toprol XL  and Rosuvastatin. She is trying to  quit smoking . Adding imdur  for chest pain/anginal equivalent. Has follow up with Vascular at the end of the month for persistent celiac artery stenosis   Lab Results  Component Value Date   CHOL 89 11/12/2022   HDL 31.60 (L) 11/12/2022   LDLCALC 41 11/12/2022   LDLDIRECT 58.0 05/02/2022   TRIG 83.0 11/12/2022   CHOLHDL 3 11/12/2022      Other orders -     Omeprazole; Take 1 capsule (40 mg total) by mouth daily.  Dispense: 90 capsule; Refill: 1 -     Ondansetron HCl; Take 1 tablet (8 mg total) by mouth every 8 (  eight) hours as needed for nausea or vomiting.  Dispense: 60 tablet; Refill: 1 -     Isosorbide Mononitrate ER; Take 1 tablet (30 mg total) by mouth daily.  Dispense: 90 tablet; Refill: 1 -     buPROPion HCl; Take 1.5 tablets (150 mg total) by mouth 2 (two) times daily.  Dispense: 90 tablet; Refill: 1 -     Carvedilol; Take 1 tablet (3.125 mg total) by mouth 2 (two) times daily with a meal.  Dispense: 60 tablet; Refill: 3 -     Nitroglycerin; Place 1 tablet (0.4 mg total) under the tongue every 5 (five) minutes as needed for chest pain. DISSOLVE 1 TABLET UNDER THE TONGUE EVERY 5 MINUTES AS   NEEDED FOR CHEST PAIN. MAX  OF 3 TABLETS IN 15 MINUTES. CALL 911 IF PAIN PERSISTS.  Dispense: 100 tablet; Refill: 3     In addition to reading EKG  I  provided 40 minutes of face-to-face time during this encounter reviewing patient's last visit with me, patient's  most recent visit with cardiology,  vascular surgery,  GI,  Gen surgery recent surgical and non surgical procedures, previous  labs and imaging studies, counseling on currently addressed issues,  and post visit ordering to diagnostics and therapeutics .   Follow-up: No follow-ups on file.   Sherlene Shams, MD

## 2023-05-08 NOTE — Assessment & Plan Note (Signed)
S.p MALS surgery AT [facility] April 2024. Still smoking ,  still with cervical artery stenosis by last mesenteric doppler done at [facility] July 2024.

## 2023-05-08 NOTE — Assessment & Plan Note (Signed)
Presumed ,  by GI.  Patient was prescribed rifaxamin recently but has had no improvement in her diarrhea or nausea

## 2023-05-08 NOTE — Assessment & Plan Note (Addendum)
Concerned that this is an anginal equivalent.  Ambulatory sats did not drop in office with 2 minute walk  and pulse remained in the high 80's.  She is not orthostatic.  High likelihood of critical CAD given her PAD and ongoing tobacco abuse.  I have ordered and reviewed a 12 lead EKG and find that there are no acute changes and patient is in sinus rhythm with a preexistent LBBB , cardiology follow up ASAP.  Sdding Imdur and carvediilol,  refilled expired Sl ntg.  Cardiology appt dec 16 . Patient advised to take SL NTG prn chest pain and call 911 if not resolved after 1 dose

## 2023-05-08 NOTE — Assessment & Plan Note (Signed)
Elevated today on  losartan . Adding Imdur and carvedilol due to concer for angina of effort and elevated reading in office (my reading of 150 systolic )  Lab Results  Component Value Date   NA 139 04/23/2023   K 4.2 04/23/2023   CL 105 04/23/2023   CO2 22 04/23/2023

## 2023-05-09 ENCOUNTER — Encounter: Payer: Self-pay | Admitting: Internal Medicine

## 2023-05-10 ENCOUNTER — Encounter: Payer: Self-pay | Admitting: Internal Medicine

## 2023-05-10 ENCOUNTER — Other Ambulatory Visit (HOSPITAL_COMMUNITY): Payer: Self-pay

## 2023-05-11 ENCOUNTER — Other Ambulatory Visit: Payer: Self-pay | Admitting: Internal Medicine

## 2023-05-13 ENCOUNTER — Ambulatory Visit: Payer: PPO

## 2023-05-13 ENCOUNTER — Encounter: Payer: Self-pay | Admitting: Internal Medicine

## 2023-05-13 ENCOUNTER — Other Ambulatory Visit: Payer: Self-pay

## 2023-05-13 ENCOUNTER — Encounter: Payer: Self-pay | Admitting: Cardiology

## 2023-05-13 ENCOUNTER — Ambulatory Visit: Payer: PPO | Attending: Cardiology | Admitting: Cardiology

## 2023-05-13 VITALS — BP 134/79 | HR 85 | Ht 65.0 in | Wt 132.4 lb

## 2023-05-13 DIAGNOSIS — I739 Peripheral vascular disease, unspecified: Secondary | ICD-10-CM

## 2023-05-13 DIAGNOSIS — Z87891 Personal history of nicotine dependence: Secondary | ICD-10-CM

## 2023-05-13 DIAGNOSIS — R079 Chest pain, unspecified: Secondary | ICD-10-CM | POA: Diagnosis not present

## 2023-05-13 DIAGNOSIS — R002 Palpitations: Secondary | ICD-10-CM | POA: Diagnosis not present

## 2023-05-13 DIAGNOSIS — R0602 Shortness of breath: Secondary | ICD-10-CM

## 2023-05-13 DIAGNOSIS — R0609 Other forms of dyspnea: Secondary | ICD-10-CM

## 2023-05-13 DIAGNOSIS — I774 Celiac artery compression syndrome: Secondary | ICD-10-CM

## 2023-05-13 DIAGNOSIS — I6523 Occlusion and stenosis of bilateral carotid arteries: Secondary | ICD-10-CM

## 2023-05-13 DIAGNOSIS — E782 Mixed hyperlipidemia: Secondary | ICD-10-CM

## 2023-05-13 DIAGNOSIS — I700 Atherosclerosis of aorta: Secondary | ICD-10-CM | POA: Diagnosis not present

## 2023-05-13 DIAGNOSIS — I129 Hypertensive chronic kidney disease with stage 1 through stage 4 chronic kidney disease, or unspecified chronic kidney disease: Secondary | ICD-10-CM | POA: Diagnosis not present

## 2023-05-13 NOTE — Patient Instructions (Signed)
Medication Instructions:  - No changes *If you need a refill on your cardiac medications before your next appointment, please call your pharmacy*  Lab Work: - None ordered  Testing/Procedures:    Please report to Radiology at the [facility] Main Entrance 30 minutes early for your test.  [address] [place], [place] [address]                         OR   Please report to Radiology at [facility] Main Entrance, medical mall, 30 mins prior to your test.  [address]  [place], [place]  [phone]  How to Prepare for Your Cardiac PET/CT Stress Test:  Nothing to eat or drink, except water, 3 hours prior to arrival time.  NO caffeine/decaffeinated products, or chocolate 12 hours prior to arrival. (Please note decaffeinated beverages (teas/coffees) still contain caffeine).  If you have caffeine within 12 hours prior, the test will need to be rescheduled.  Medication instructions: Do not take erectile dysfunction medications for 72 hours prior to test (sildenafil, tadalafil) Do not take nitrates (isosorbide mononitrate, Ranexa) the day before or day of test Do not take tamsulosin the day before or morning of test Hold theophylline containing medications for 12 hours. Hold Dipyridamole 48 hours prior to the test.  Diabetic Preparation: If able to eat breakfast prior to 3 hour fasting, you may take all medications, including your insulin. Do not worry if you miss your breakfast dose of insulin - start at your next meal. If you do not eat prior to 3 hour fast-Hold all diabetes (oral and insulin) medications. Patients who wear a continuous glucose monitor MUST remove the device prior to scanning.  You may take your remaining medications with water.  NO perfume, cologne or lotion on chest or abdomen area. FEMALES - Please avoid wearing dresses to this appointment.  Total time is 1 to 2 hours; you may want to bring reading material for the  waiting time.  IF YOU THINK YOU MAY BE PREGNANT, OR ARE NURSING PLEASE INFORM THE TECHNOLOGIST.  In preparation for your appointment, medication and supplies will be purchased.  Appointment availability is limited, so if you need to cancel or reschedule, please call the Radiology Department at [phone] [facility]) OR [phone] [facility]) 24 hours in advance to avoid a cancellation fee of $100.00  What to Expect When you Arrive:  Once you arrive and check in for your appointment, you will be taken to a preparation room within the Radiology Department.  A technologist or Nurse will obtain your medical history, verify that you are correctly prepped for the exam, and explain the procedure.  Afterwards, an IV will be started in your arm and electrodes will be placed on your skin for EKG monitoring during the stress portion of the exam. Then you will be escorted to the PET/CT scanner.  There, staff will get you positioned on the scanner and obtain a blood pressure and EKG.  During the exam, you will continue to be connected to the EKG and blood pressure machines.  A small, safe amount of a radioactive tracer will be injected in your IV to obtain a series of pictures of your heart along with an injection of a stress agent.    After your Exam:  It is recommended that you eat a meal and drink a caffeinated beverage to counter act any effects of the stress agent.  Drink plenty of  fluids for the remainder of the day and urinate frequently for the first couple of hours after the exam.  Your doctor will inform you of your test results within 7-10 business days.  For more information and frequently asked questions, please visit our website: [email or web address]  For questions about your test or how to prepare for your test, please call: Cardiac Imaging Nurse Navigators Office: [phone]    Heart Monitor:  Your physician has requested you wear a ZIO XT patch heart monitor for 14  days.  Your monitor will be mailed to your home address within 3-5 business days. This is sent via [employer] from [employer]. However, if you have not received your monitor after 5 business days please send [place] a MyChart message or call the office at [phone], so we may follow up on this for you.  YouTube search Zio patch heart monitor 3:46 step-by-step video instructions  This monitor is a medical device (single patch monitor) that records the heart's electrical activity. Doctors most often use these monitors to diagnose arrhythmias. Arrhythmias are problems with the speed or rhythm of the heartbeat.   [employer] supplies 1 patch per enrollment. Additional stickers are not available.  Please DO NOT apply the patch if you will be having a Nuclear Stress Test, Echocardiogram, Cardiac CT, Cardiac MRI, Chest X-ray during the period you would be wearing the monitor. The patch cannot be worn during these tests.  You cannot remove and re-apply the ZIO patch monitor.   Applying the Monitor: Once you receive your monitor, this will include a small razor, abrader, and 4 alcohol pads. Shave hair from upper left chest Rub abrader disc in 40 strokes over the left upper chest as indicated in your monitor instructions Clean area with 4 enclosed alcohol pads (there may be a mild & brief stinging sensation over the newly abraded area, but this is normal). Let dry Apply patch as indicated in monitor instructions. Patch will be placed under collarbone on the left side of the chest with arrow pointing upward. Rub adhesive wings for 2 minutes. Remove white label marked "1". Remove the white label marked "2". Rub patch adhesive wings for an 2 minutes.  While looking in a mirror, press and release button in the center of the patch. You may hear a "click". A small green light will flash 4-6 times and then stop. This will be your indicator that the monitor has been turned on.  Wearing the Monitor: Avoid  showering during the first 24 hours of wearing the monitor.  After 24 hours you may shower with the patch on. Take brief showers with your back facing the shower head.  Avoid excessive sweating to help maximize wear time. Do not submerge the device, no hot tubs, and no swimming pools. Keep any lotions or oils away from the patch. Press the button if you feel a symptom. You will hear a small click. Record date, time, and symptoms in the Patient Logbook or App.  Monitor Issues: Call [employer] Customer Care at [phone] if you have questions regarding your Zio Patch Monitor. Call them immediately if you see an orange/ amber colored light blinking on your monitor. If your monitor falls off and you cannot get this reapplied or if you need suggestions for securing your monitor call [employer] at [phone].   Returning the Monitor: Once you have completed wearing your monitor, follow instructions on the last 2 pages of the Patient Logbook. Stick monitor patch on to the last  page of the Patient Logbook.  Place Patient Logbook with monitor in the return box provided. Use locking tab on box and tape box closed securely. The return box has pre-paid postage on it.  Place the return box in the regular [place] Mail box as soon as possible It will take anywhere from 1-2 weeks for your provider to receive and review your results once you mail this back. If for some reason you have misplaced your return box then call our office and we can provide another box and/or mail it off for you.   Billing  and Patient Assistance Program Information: We have supplied [employer] with any of your insurance information on file for billing purposes. [employer] offers a sliding scale Patient Assistance Program for patients that do not have insurance, or whose insurance does not completely cover the cost of the ZIO monitor. You must apply for the Patient Assistance Program to qualify for this discounted rate. To apply,  please call [employer] at [phone], select option 1, ask to apply for the Patient Assistance Program. [employer] will ask your household income, and how many people are in your household. They will quote your out-of-pocket cost based on that information. [employer] will also be able to set up for a 16-month, interest-free payment plan if needed.     Follow-Up: At [facility], you and your health needs are our priority.  As part of our continuing mission to provide you with exceptional heart care, we have created designated Provider Care Teams.  These Care Teams include your primary Cardiologist (physician) and Advanced Practice Providers (APPs -  Physician Assistants and Nurse Practitioners) who all work together to provide you with the care you need, when you need it.  Your next appointment:   6 - 7 week(s)  Provider:   Charlsie Quest, NP    Other Instructions Do not place Zio until after cardiac PET is completed

## 2023-05-13 NOTE — Progress Notes (Signed)
Cardiology Office Note:  .   Date:  05/13/2023  ID:  Cheryl [name], DOB [birth year], MRN [id number] PCP: Sherlene Shams, MD  [facility] Providers Cardiologist:  None    History of Present Illness: Cheryl [name] is a 67 y.o. female with a past medical history of chronic left bundle branch block, smoker 41 years, obstructive sleep apnea on CPAP, fatigue, peripheral arterial disease, aortic  mixed hyperlipidemia, splenic artery aneurysm, bilateral carotid artery stenosis, and no significant coronary artery disease by cardiac catheterization at [facility] in 2003, who is here today for follow-up.   Previous left heart catheterization was completed at [facility] in 2003 reportedly with no significant coronary artery disease at that time.  She had additional episodes of palpitations and chest pain.  For prior episodes of chest pain workup in June 2012 of stress testing/Myoview showed no ischemia with moderate aortic atherosclerosis.  Repeat Myoview in 01/30/2019 showed a fixed defect in the anteroseptal, apical region, likely attenuation artifact/bundle branch block though unable to exclude prior MI.  Estimated EF of 40%.  His CT scan in 2019 showed three-vessel coronary calcification with mild aortic arch atherosclerosis.  There have been no significant increase in splenic artery when compared to prior exams.  Mild increase in celiac artery when compared to exam on 11/03/2018 however more closely correlate with studies 09/14/2017 and 08/14/2016.  Ultrasound completed in March 2018 with Dr. Lorretta Harp documented greater than 70%, celiac artery disease.   She was last seen in clinic 04/16/2022 by Dr. [name].  At that time she was complaining of shortness of breath, and dizziness with standing up too quickly and chest pressure at times.  She was started on metoprolol succinate 25 mg daily as she had propranolol as needed but had not been taking it.  No further testing was ordered at that time.  She recently  was evaluated by her PCP on 05/08/2023.  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  She had been following up at [facility].  She states that she has undergone 4 different procedures.  She had followed with vascular with Dr. Gilda Crease for his celiac artery stenosis and he tried access through the right groin and the left arm that were unsuccessful which ended up sending her to [facility] for further evaluation.  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  She continues to have chronic back pain with hydrocodone twice daily.  She returns to clinic today with continued shortness of breath and occasional vertigo with her daytime activities.  She states that the symptoms have been occurring since her MALS surgery in April 2024.  She also noted some precordial chest discomfort with associated shortness of breath and palpitations.  She describes her chest discomfort as a tight heaviness that can be with rest or exertion.  Palpitations can come with rest or exertion but she has primarily noticed them in the afternoon or evening.  She does continue to suffer some vertigo and have the dizzy spells.  She states that her symptoms have all been exacerbated since that she has had 4 surgeries with in the last year.  She states that she followed up with her PCP and was started on Imdur 30 mg daily for chest pain and given sublingual nitro to take as needed  for pain.  Her last hospitalization was at [facility] 04/19/2023.  She denies any recurrent emergency department visits since discharge.  ROS: 10 point review of systems has been reviewed and considered negative with exception of what is listed in the HPI  Studies Reviewed: [name]      Recent EKG completed by her PCP on 05/08/2023 was reviewed revealing sinus rhythm with a rate of 77 with her  chronic left bundle branch block with no acute changes noted from prior studies.  Lexiscan MPI 01/30/2019 Pharmacological myocardial perfusion imaging study with no significant  Ischemia Fixed defect in the anteroseptal, apical region, likely attenuation artifact/bundle branch block, though unable to exclude prior MI Anteroseptal wall hypokinesis, secondary to bundle branch block,  EF estimated at 40% No EKG changes concerning for ischemia at peak stress or in recovery. Baseline EKG with LBBB Low risk scan Study compared to prior study dated 10/2010, no significant change noted, prior defect detailed above secondary to LBBB was previously noted. Risk Assessment/Calculations:            Orthostatic VS for the past 72 hrs (Last 3 readings):  Orthostatic BP Patient Position BP Location Cuff Size Orthostatic Pulse  05/13/23 0840 125/80 Standing Left Arm Normal 81  05/13/23 0835 134/79 Sitting Left Arm Normal 76  05/13/23 0830 139/79 Supine Left Arm Normal 76    Physical Exam:   VS:  BP 134/79 (BP Location: Left Arm, Patient Position: Supine, Cuff Size: Normal)   Pulse 85   Ht 5\' 5"  (1.651 m)   Wt 132 lb 6.4 oz (60.1 kg)   SpO2 98%   BMI 22.03 kg/m    Wt Readings from Last 3 Encounters:  05/13/23 132 lb 6.4 oz (60.1 kg)  05/08/23 131 lb 2 oz (59.5 kg)  04/23/23 128 lb (58.1 kg)    GEN: Well nourished, well developed in no acute distress NECK: No JVD; No carotid bruits CARDIAC: RRR, no murmurs, rubs, gallops RESPIRATORY:  Clear to auscultation without rales, wheezing or rhonchi  ABDOMEN: Soft, non-tender, non-distended EXTREMITIES:  No edema; No deformity   ASSESSMENT AND PLAN: .   Precordial chest discomfort that she states has been off and on for the last several weeks but started approximately back in April status post surgery.  Recently was given Imdur and Nitrostat by her PCP.  EKG recently completed at her PCPs office showed no ischemic changes but she has a chronic left bundle  branch block.  Previously she had Lexiscan Myoview in 2020.  For her pain that she has been suffering from she has been scheduled for cardiac PET stress and was advised that she will receive the same pharmacological agent but there will be a better pictures versus a routine Lexiscan.  Dyspnea on exertion and shortness of breath that she states started 2024 and has been ongoing.  She has been scheduled for an echocardiogram to evaluate her LVEF and structural abnormality.  Palpitations that have been bothering consistently over the last several weeks.  Associated with her shortness of breath and dyspnea on exertion.  After she finishes her coronary PET stress and echocardiogram she is being mailed a ZIO XT monitor for 2 weeks to rule out arrhythmia.  Aortic atherosclerosis with history of coronary calcification.  Cholesterol remains at goal and she is continued on statin, Imdur, and depending on the results of her test will be started on aspirin 81 mg daily.  Smoking cessation is recommended.  Mixed hyperlipidemia where LDL is 41 at goal.  She is continued on rosuvastatin 10 mg daily.  Renal hypertension with blood pressure 149/84 with a component of dizziness and questionable orthostasis.  She is continued on carvedilol 3.125 mg twice daily and routine pain medications and recently started on Imdur 30 mg daily  She has been encouraged to monitor pressures 1 to 2 hours post medications at home as well.  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  Last mesenteric Doppler done at [facility] in July 2024.  She has upcoming appointment with vascular.  Continued on statin and encouraged to stop smoking.  Peripheral arterial disease/carotid artery stenosis which she is encouraged to continue aggressive management of blood pressure and cholesterol.  She has also been advised to quit smoking.  She has follow-up with vascular at the end of the month for persistent celiac artery  stenosis.  Tobacco abuse she continues to smoke.  Total cessation is recommended.     Informed Consent   Shared Decision Making/Informed Consent The risks [chest pain, shortness of breath, cardiac arrhythmias, dizziness, blood pressure fluctuations, myocardial infarction, stroke/transient ischemic attack, nausea, vomiting, allergic reaction, radiation exposure, metallic taste sensation and life-threatening complications (estimated to be 1 in 10,000)], benefits (risk stratification, diagnosing coronary artery disease, treatment guidance) and alternatives of a cardiac PET stress test were discussed in detail with Ms. Dennie and she agrees to proceed.     Dispo: Patient to return to clinic to see MD/APP in 6 weeks or sooner after testing is completed for reevaluation of symptoms  Signed, Malaney Mcbean, NP

## 2023-05-14 ENCOUNTER — Encounter: Payer: Self-pay | Admitting: Internal Medicine

## 2023-05-14 ENCOUNTER — Ambulatory Visit (INDEPENDENT_AMBULATORY_CARE_PROVIDER_SITE_OTHER): Payer: PPO | Admitting: Internal Medicine

## 2023-05-14 VITALS — BP 116/60 | HR 89 | Ht 65.0 in | Wt 131.0 lb

## 2023-05-14 DIAGNOSIS — R1084 Generalized abdominal pain: Secondary | ICD-10-CM

## 2023-05-14 DIAGNOSIS — G8929 Other chronic pain: Secondary | ICD-10-CM

## 2023-05-14 DIAGNOSIS — E538 Deficiency of other specified B group vitamins: Secondary | ICD-10-CM

## 2023-05-14 DIAGNOSIS — M48061 Spinal stenosis, lumbar region without neurogenic claudication: Secondary | ICD-10-CM | POA: Diagnosis not present

## 2023-05-14 DIAGNOSIS — R1013 Epigastric pain: Secondary | ICD-10-CM

## 2023-05-14 MED ORDER — HYDROCODONE-ACETAMINOPHEN 5-325 MG PO TABS: 1.0000 | ORAL_TABLET | Freq: Two times a day (BID) | ORAL | 0 refills | Status: DC | PRN

## 2023-05-14 NOTE — Progress Notes (Unsigned)
Subjective:  Patient ID: Cheryl [name], female    DOB: June 24, 1955  Age: 67 y.o. MRN: [id number]  CC: There were no encounter diagnoses.   HPI Cheryl [name] presents for  Chief Complaint  Patient presents with   Medical Management of Chronic Issues    3 month follow up    1) Chronic pain management   2) exertional dyspnea:  seen one week ago with cc  reports  of increased chest pressure/dyspnea for the past several months (since her MALS surgery)concerning for anginal equivalent She has been scheduled for cardiac PET Lexiscan , ECHO and Zio monitor.    30    Outpatient Medications Prior to Visit  Medication Sig Dispense Refill   albuterol (PROAIR HFA) 108 (90 Base) MCG/ACT inhaler Inhale 1-2 puffs into the lungs every 6 (six) hours as needed for wheezing or shortness of breath. 8 g 2   amitriptyline (ELAVIL) 50 MG tablet Take 1 tablet (50 mg total) by mouth at bedtime. 90 tablet 1   buPROPion (WELLBUTRIN) 100 MG tablet Take 1.5 tablets (150 mg total) by mouth 2 (two) times daily. 90 tablet 1   Calcium Carbonate-Vit D-Min (CALCIUM 1200 PO) Take 1 tablet by mouth daily.     carvedilol (COREG) 3.125 MG tablet Take 1 tablet (3.125 mg total) by mouth 2 (two) times daily with a meal. 60 tablet 3   Cholecalciferol (VITAMIN D) 125 MCG (5000 UT) CAPS Take by mouth.     docusate sodium (COLACE) 100 MG capsule Take 100 mg by mouth 2 (two) times daily.     folic acid (FOLVITE) 1 MG tablet Take 1 mg by mouth daily.     gabapentin (NEURONTIN) 300 MG capsule Take 1 capsule (300 mg total) by mouth 3 (three) times daily. 270 capsule 3   HYDROcodone-acetaminophen (NORCO/VICODIN) 5-325 MG tablet Take 1 tablet by mouth 2 (two) times daily as needed for moderate pain (pain score 4-6). 60 tablet 0   HYDROcodone-acetaminophen (NORCO/VICODIN) 5-325 MG tablet Take 1 tablet by mouth 2 (two) times daily as needed for moderate pain (pain score 4-6). 60 tablet 0   isosorbide mononitrate (IMDUR) 30 MG  24 hr tablet Take 1 tablet (30 mg total) by mouth daily. 90 tablet 1   nitroGLYCERIN (NITROSTAT) 0.4 MG SL tablet Place 1 tablet (0.4 mg total) under the tongue every 5 (five) minutes as needed for chest pain. DISSOLVE 1 TABLET UNDER THE TONGUE EVERY 5 MINUTES AS  NEEDED FOR CHEST PAIN. MAX  OF 3 TABLETS IN 15 MINUTES. CALL 911 IF PAIN PERSISTS. 100 tablet 3   omeprazole (PRILOSEC) 40 MG capsule Take 1 capsule (40 mg total) by mouth daily. 90 capsule 1   ondansetron (ZOFRAN) 8 MG tablet Take 1 tablet (8 mg total) by mouth every 8 (eight) hours as needed for nausea or vomiting. 60 tablet 1   Prasterone, DHEA, 10 MG CAPS every other day.     progesterone (PROMETRIUM) 200 MG capsule Take 200 mg by mouth at bedtime.     rOPINIRole (REQUIP) 1 MG tablet Take 1 tablet (1 mg total) by mouth 3 (three) times daily. 90 tablet 1   rosuvastatin (CRESTOR) 10 MG tablet Take 1 tablet (10 mg total) by mouth daily. 90 tablet 3   traMADol (ULTRAM) 50 MG tablet Take 1 tablet by mouth twice daily as needed for pain 60 tablet 2   vitamin C (ASCORBIC ACID) 500 MG tablet Take 500 mg by mouth daily.  No facility-administered medications prior to visit.    Review of Systems;  Patient denies headache, fevers, malaise, unintentional weight loss, skin rash, eye pain, sinus congestion and sinus pain, sore throat, dysphagia,  hemoptysis , cough, dyspnea, wheezing, chest pain, palpitations, orthopnea, edema, abdominal pain, nausea, melena, diarrhea, constipation, flank pain, dysuria, hematuria, urinary  Frequency, nocturia, numbness, tingling, seizures,  Focal weakness, Loss of consciousness,  Tremor, insomnia, depression, anxiety, and suicidal ideation.      Objective:  BP 116/60   Pulse 89   Ht 5\' 5"  (1.651 m)   Wt 131 lb (59.4 kg)   SpO2 98%   BMI 21.80 kg/m   BP Readings from Last 3 Encounters:  05/14/23 116/60  05/13/23 134/79  05/08/23 118/62    Wt Readings from Last 3 Encounters:  05/14/23 131 lb (59.4  kg)  05/13/23 132 lb 6.4 oz (60.1 kg)  05/08/23 131 lb 2 oz (59.5 kg)    Physical Exam  Lab Results  Component Value Date   HGBA1C 5.5 05/02/2022   HGBA1C 5.1 10/26/2015   HGBA1C 5.4 11/25/2013    Lab Results  Component Value Date   CREATININE 1.18 (H) 04/23/2023   CREATININE 0.89 02/20/2023   CREATININE 0.98 02/12/2023    Lab Results  Component Value Date   WBC 8.1 04/23/2023   HGB 9.7 (L) 04/23/2023   HCT 31.1 (L) 04/23/2023   PLT 297 04/23/2023   GLUCOSE 111 (H) 04/23/2023   CHOL 89 11/12/2022   TRIG 83.0 11/12/2022   HDL 31.60 (L) 11/12/2022   LDLDIRECT 58.0 05/02/2022   LDLCALC 41 11/12/2022   ALT 19 04/23/2023   AST 20 04/23/2023   NA 139 04/23/2023   K 4.2 04/23/2023   CL 105 04/23/2023   CREATININE 1.18 (H) 04/23/2023   BUN 16 04/23/2023   CO2 22 04/23/2023   TSH 0.76 11/12/2022   INR 1.1 07/17/2022   HGBA1C 5.5 05/02/2022   MICROALBUR 1.1 05/02/2022    No results found.  Assessment & Plan:  .There are no diagnoses linked to this encounter.   I provided 30 minutes of face-to-face time during this encounter reviewing patient's last visit with me, patient's  most recent visit with cardiology,  nephrology,  and neurology,  recent surgical and non surgical procedures, previous  labs and imaging studies, counseling on currently addressed issues,  and post visit ordering to diagnostics and therapeutics .   Follow-up: No follow-ups on file.   Sherlene Shams, MD

## 2023-05-14 NOTE — Patient Instructions (Signed)
TRY ADDING A DOSE OF BENEFIBER MIXED WITH YOUR BEVERAGE  ONCE DAILY TO SEE IF THE DIARRHEA IMPROVES   IF NOT ,  WE WILL TRY LOMOTIL  ONCE DAILY (THIS IS A CONTROLLED SUBSTANCE SO YOU WILL NEED TO SEND ME A MESSAGE )

## 2023-05-15 DIAGNOSIS — E538 Deficiency of other specified B group vitamins: Secondary | ICD-10-CM | POA: Insufficient documentation

## 2023-05-15 DIAGNOSIS — R1013 Epigastric pain: Secondary | ICD-10-CM | POA: Insufficient documentation

## 2023-05-15 LAB — HEPATIC FUNCTION PANEL
ALT: 22 U/L (ref 0–35)
AST: 18 U/L (ref 0–37)
Albumin: 4.5 g/dL (ref 3.5–5.2)
Alkaline Phosphatase: 90 U/L (ref 39–117)
Bilirubin, Direct: 0.2 mg/dL (ref 0.0–0.3)
Total Bilirubin: 0.9 mg/dL (ref 0.2–1.2)
Total Protein: 6.8 g/dL (ref 6.0–8.3)

## 2023-05-15 LAB — LIPASE: Lipase: 17 U/L (ref 11.0–59.0)

## 2023-05-15 NOTE — Assessment & Plan Note (Signed)
Lipase and LFT s are normal .  Continue PPI

## 2023-05-15 NOTE — Assessment & Plan Note (Addendum)
Secondary to lumbar spinal stenosis.  Her pain requires use of chronic opiate therapy; NSAIDS are contraindicated due to CKD .  Refill history confirmed via [place] Controlled Substance databas, accessed by me today... refills given for 3 months

## 2023-05-16 DIAGNOSIS — Z9889 Other specified postprocedural states: Secondary | ICD-10-CM | POA: Diagnosis not present

## 2023-05-16 DIAGNOSIS — I774 Celiac artery compression syndrome: Secondary | ICD-10-CM | POA: Diagnosis not present

## 2023-05-16 DIAGNOSIS — I2510 Atherosclerotic heart disease of native coronary artery without angina pectoris: Secondary | ICD-10-CM | POA: Diagnosis not present

## 2023-05-16 DIAGNOSIS — R002 Palpitations: Secondary | ICD-10-CM | POA: Diagnosis not present

## 2023-05-16 DIAGNOSIS — N189 Chronic kidney disease, unspecified: Secondary | ICD-10-CM | POA: Diagnosis not present

## 2023-05-16 DIAGNOSIS — F17210 Nicotine dependence, cigarettes, uncomplicated: Secondary | ICD-10-CM | POA: Diagnosis not present

## 2023-05-16 DIAGNOSIS — E785 Hyperlipidemia, unspecified: Secondary | ICD-10-CM | POA: Diagnosis not present

## 2023-05-16 DIAGNOSIS — J449 Chronic obstructive pulmonary disease, unspecified: Secondary | ICD-10-CM | POA: Diagnosis not present

## 2023-05-19 ENCOUNTER — Other Ambulatory Visit: Payer: Self-pay | Admitting: Internal Medicine

## 2023-05-20 ENCOUNTER — Other Ambulatory Visit: Payer: Self-pay | Admitting: Internal Medicine

## 2023-05-20 DIAGNOSIS — M48061 Spinal stenosis, lumbar region without neurogenic claudication: Secondary | ICD-10-CM

## 2023-05-20 LAB — INTRINSIC FACTOR ANTIBODIES: Intrinsic Factor: NEGATIVE

## 2023-05-21 DIAGNOSIS — K6282 Dysplasia of anus: Secondary | ICD-10-CM | POA: Diagnosis not present

## 2023-05-23 ENCOUNTER — Inpatient Hospital Stay: Payer: PPO | Attending: Internal Medicine

## 2023-06-07 DIAGNOSIS — R002 Palpitations: Secondary | ICD-10-CM | POA: Diagnosis not present

## 2023-06-10 DIAGNOSIS — N951 Menopausal and female climacteric states: Secondary | ICD-10-CM | POA: Diagnosis not present

## 2023-06-15 ENCOUNTER — Encounter: Payer: Self-pay | Admitting: Internal Medicine

## 2023-06-17 ENCOUNTER — Other Ambulatory Visit: Payer: Self-pay

## 2023-06-17 MED ORDER — CARVEDILOL 6.25 MG PO TABS: 6.2500 mg | ORAL_TABLET | Freq: Two times a day (BID) | ORAL | 3 refills | Status: DC

## 2023-06-17 NOTE — Progress Notes (Signed)
Average heart rate of 81 bpm, several 5.  Heart rates noted 1 of 13 beats, 104 beats, 1 episode 6 beats, no atrial fibrillation or atrial flutter noted.  Triggered events were associated with sinus rhythm.  Recommend increasing carvedilol from 3.125 mg twice daily to 6.25 mg twice daily.

## 2023-06-18 ENCOUNTER — Other Ambulatory Visit: Payer: Self-pay | Admitting: Internal Medicine

## 2023-06-18 DIAGNOSIS — K5792 Diverticulitis of intestine, part unspecified, without perforation or abscess without bleeding: Secondary | ICD-10-CM | POA: Diagnosis not present

## 2023-06-18 DIAGNOSIS — D219 Benign neoplasm of connective and other soft tissue, unspecified: Secondary | ICD-10-CM | POA: Diagnosis not present

## 2023-06-18 DIAGNOSIS — M2550 Pain in unspecified joint: Secondary | ICD-10-CM | POA: Diagnosis not present

## 2023-06-18 DIAGNOSIS — Z79890 Hormone replacement therapy (postmenopausal): Secondary | ICD-10-CM | POA: Diagnosis not present

## 2023-06-18 DIAGNOSIS — N183 Chronic kidney disease, stage 3 unspecified: Secondary | ICD-10-CM | POA: Diagnosis not present

## 2023-06-18 DIAGNOSIS — M719 Bursopathy, unspecified: Secondary | ICD-10-CM | POA: Diagnosis not present

## 2023-06-18 DIAGNOSIS — D569 Thalassemia, unspecified: Secondary | ICD-10-CM | POA: Diagnosis not present

## 2023-06-18 DIAGNOSIS — R232 Flushing: Secondary | ICD-10-CM | POA: Diagnosis not present

## 2023-06-18 DIAGNOSIS — R5383 Other fatigue: Secondary | ICD-10-CM | POA: Diagnosis not present

## 2023-06-18 DIAGNOSIS — Z6822 Body mass index (BMI) 22.0-22.9, adult: Secondary | ICD-10-CM | POA: Diagnosis not present

## 2023-06-18 DIAGNOSIS — N951 Menopausal and female climacteric states: Secondary | ICD-10-CM | POA: Diagnosis not present

## 2023-06-18 LAB — GENECONNECT MOLECULAR SCREEN: Genetic Analysis Overall Interpretation: NEGATIVE

## 2023-06-19 MED ORDER — HYDROCODONE-ACETAMINOPHEN 5-325 MG PO TABS: 1.0000 | ORAL_TABLET | Freq: Two times a day (BID) | ORAL | 0 refills | Status: DC | PRN

## 2023-06-20 DIAGNOSIS — M799 Soft tissue disorder, unspecified: Secondary | ICD-10-CM | POA: Diagnosis not present

## 2023-06-20 DIAGNOSIS — I774 Celiac artery compression syndrome: Secondary | ICD-10-CM | POA: Diagnosis not present

## 2023-06-24 ENCOUNTER — Encounter: Payer: Self-pay | Admitting: Internal Medicine

## 2023-06-24 ENCOUNTER — Inpatient Hospital Stay: Payer: PPO | Attending: Internal Medicine

## 2023-06-24 ENCOUNTER — Inpatient Hospital Stay: Payer: PPO

## 2023-06-24 ENCOUNTER — Inpatient Hospital Stay (HOSPITAL_BASED_OUTPATIENT_CLINIC_OR_DEPARTMENT_OTHER): Payer: PPO | Admitting: Internal Medicine

## 2023-06-24 VITALS — BP 131/79 | HR 94 | Temp 97.6°F | Resp 20 | Wt 132.4 lb

## 2023-06-24 DIAGNOSIS — D508 Other iron deficiency anemias: Secondary | ICD-10-CM | POA: Diagnosis not present

## 2023-06-24 DIAGNOSIS — D561 Beta thalassemia: Secondary | ICD-10-CM | POA: Diagnosis not present

## 2023-06-24 DIAGNOSIS — D563 Thalassemia minor: Secondary | ICD-10-CM | POA: Diagnosis not present

## 2023-06-24 DIAGNOSIS — F17210 Nicotine dependence, cigarettes, uncomplicated: Secondary | ICD-10-CM | POA: Diagnosis not present

## 2023-06-24 DIAGNOSIS — D509 Iron deficiency anemia, unspecified: Secondary | ICD-10-CM | POA: Insufficient documentation

## 2023-06-24 DIAGNOSIS — E538 Deficiency of other specified B group vitamins: Secondary | ICD-10-CM | POA: Insufficient documentation

## 2023-06-24 LAB — COMPREHENSIVE METABOLIC PANEL
ALT: 20 U/L (ref 0–44)
AST: 18 U/L (ref 15–41)
Albumin: 4.6 g/dL (ref 3.5–5.0)
Alkaline Phosphatase: 80 U/L (ref 38–126)
Anion gap: 10 (ref 5–15)
BUN: 14 mg/dL (ref 8–23)
CO2: 23 mmol/L (ref 22–32)
Calcium: 9.2 mg/dL (ref 8.9–10.3)
Chloride: 104 mmol/L (ref 98–111)
Creatinine, Ser: 0.96 mg/dL (ref 0.44–1.00)
GFR, Estimated: >60 mL/min (ref >60)
Glucose, Bld: 96 mg/dL (ref 70–99)
Potassium: 4.1 mmol/L (ref 3.5–5.1)
Sodium: 137 mmol/L (ref 135–145)
Total Bilirubin: 1.1 mg/dL (ref 0.0–1.2)
Total Protein: 7 g/dL (ref 6.5–8.1)

## 2023-06-24 LAB — CBC WITH DIFFERENTIAL/PLATELET
Abs Immature Granulocytes: 0.04 10*3/uL (ref 0.00–0.07)
Basophils Absolute: 0.1 10*3/uL (ref 0.0–0.1)
Basophils Relative: 1 %
Eosinophils Absolute: 0.2 10*3/uL (ref 0.0–0.5)
Eosinophils Relative: 2 %
HCT: 29.2 % — ABNORMAL LOW (ref 36.0–46.0)
Hemoglobin: 8.9 g/dL — ABNORMAL LOW (ref 12.0–15.0)
Immature Granulocytes: 0 %
Lymphocytes Relative: 23 %
Lymphs Abs: 2 10*3/uL (ref 0.7–4.0)
MCH: 22 pg — ABNORMAL LOW (ref 26.0–34.0)
MCHC: 30.5 g/dL (ref 30.0–36.0)
MCV: 72.1 fL — ABNORMAL LOW (ref 80.0–100.0)
Monocytes Absolute: 0.6 10*3/uL (ref 0.1–1.0)
Monocytes Relative: 7 %
Neutro Abs: 6 10*3/uL (ref 1.7–7.7)
Neutrophils Relative %: 67 %
Platelets: 273 10*3/uL (ref 150–400)
RBC: 4.05 MIL/uL (ref 3.87–5.11)
RDW: 17.3 % — ABNORMAL HIGH (ref 11.5–15.5)
WBC: 9 10*3/uL (ref 4.0–10.5)
nRBC: 1 % — ABNORMAL HIGH (ref 0.0–0.2)

## 2023-06-24 LAB — IRON AND TIBC
Iron: 103 ug/dL (ref 28–170)
Saturation Ratios: 34 % — ABNORMAL HIGH (ref 10.4–31.8)
TIBC: 307 ug/dL (ref 250–450)
UIBC: 204 ug/dL

## 2023-06-24 LAB — FERRITIN: Ferritin: 329 ng/mL — ABNORMAL HIGH (ref 11–307)

## 2023-06-24 LAB — VITAMIN B12: Vitamin B-12: 736 pg/mL (ref 180–914)

## 2023-06-24 MED ORDER — CYANOCOBALAMIN 1000 MCG/ML IJ SOLN
1000.0000 ug | Freq: Once | INTRAMUSCULAR | Status: AC
  Administered 2023-06-24: 1000 ug via INTRAMUSCULAR

## 2023-06-24 NOTE — Assessment & Plan Note (Addendum)
#   Chronic microcytic anemia/history of beta thalassemia - out of proportion to her anemia. S/p IV venoferx4 in fall of 2017.  # Today hemoglobin is 8.7 saturation 39% ferritin 188 [slightly lower compared to previous]; HOLD Venofer. Continue dietary iron.   # Chronic diarrhea- AUG 2024- [colonoscopy; [facility]]- Anus nodule, biopsy:-Low-grade squamous intraepithelial lesion - s/p NOV 5th- 2024- stable.    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 Distal branches of the celiac artery are patent - awaiting vascuar evaluation in [facility].   # Family Hx: hemochromatosis [brother- cirrhosis- liver cancer-]-   # B12 deficiency - on PO B12;   # active smoker- in process of quitting smoking-   # DISPOSITION: # b12 injection today; NO venofer # follow up in 4 month- MD; - labs- cbc/ bmp; b12; iron studies; ferritin-- possible venofer; and b12 injection- Dr.B.   Cc; Dr.Tullo.

## 2023-06-24 NOTE — Progress Notes (Signed)
Patient has no concerns

## 2023-06-24 NOTE — Progress Notes (Signed)
[facility] CONSULT NOTE  Patient Care Team: Sherlene Shams, MD as PCP - General (Internal Medicine) Lemar Livings Merrily Pew, MD as Consulting Physician (General Surgery) Earna Coder, MD as Consulting Physician (Oncology)  CHIEF COMPLAINTS/PURPOSE OF CONSULTATION: Anemia.  HEMATOLOGY HISTORY  # CHRONIC MICROCYTIC ANEMIA- BETA THALASSEMIA MINOR [colo- 2016; Dr. Fanny Skates July 2017- IV trial of Venofer x4   # Lung nodules [CT- sub cm Jan 2017]-2019 CT scan stable.  Benign.  HISTORY OF PRESENTING ILLNESS: Alone. Walking independently.   Cheryl [name] 68 y.o.  female with a history of beta thalassemia minor; also iron deficiency anemia/B12 deficiency is here for follow-up.  Patient is currently retired.  Patient has chronic mild fatigue.  Intermittent dizziness.  Denies any shortness of breath or cough.  She denies any blood in stools or black or stools.  Appetite is good.  No weight loss.  Review of Systems  Constitutional:  Positive for weight loss. Negative for chills, diaphoresis and fever.  HENT:  Negative for nosebleeds and sore throat.   Eyes:  Negative for double vision.  Respiratory:  Negative for cough, hemoptysis, sputum production and wheezing.   Cardiovascular:  Negative for chest pain, palpitations, orthopnea and leg swelling.  Gastrointestinal:  Positive for diarrhea. Negative for abdominal pain, blood in stool, constipation, heartburn, melena, nausea and vomiting.  Genitourinary:  Negative for dysuria, frequency and urgency.  Musculoskeletal:  Negative for back pain and joint pain.  Skin: Negative.  Negative for itching and rash.  Neurological:  Positive for dizziness. Negative for tingling, focal weakness, weakness and headaches.  Endo/Heme/Allergies:  Does not bruise/bleed easily.  Psychiatric/Behavioral:  Negative for depression. The patient is not nervous/anxious and does not have insomnia.      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   Arrhythmia    left bundle branch block   Arthritis    Blood in stool    Chicken pox    Generalized headaches    History of blood transfusion    Kidney disease, chronic, stage III (GFR 30-59 ml/min) (HCC)    LBBB (left bundle branch block)    Renal disorder    Right lateral epicondylitis 08/13/2017   Thyroid disease    UTI (lower urinary tract infection)     SURGICAL HISTORY: Past Surgical History:  Procedure Laterality Date   ABDOMINAL HYSTERECTOMY  1997   APPENDECTOMY  1981   BREAST EXCISIONAL BIOPSY Bilateral 1976   neg   BREAST SURGERY  1976   CARDIAC CATHETERIZATION  2004   [facility]   CARDIAC CATHETERIZATION  2006   [facility]   cystic fibrosis tumor removal  1983   THUMB AMPUTATION  1992   traumatic   TONSILLECTOMY AND ADENOIDECTOMY  1964   VISCERAL ANGIOGRAPHY N/A 06/29/2022   Procedure: VISCERAL ANGIOGRAPHY;  Surgeon: Renford Dills, MD;  Location: [facility] INVASIVE CV LAB;  Service: Cardiovascular;  Laterality: N/A;   VISCERAL ANGIOGRAPHY N/A 07/17/2022   Procedure: VISCERAL ANGIOGRAPHY;  Surgeon: Renford Dills, MD;  Location: [facility] INVASIVE CV LAB;  Service: Cardiovascular;  Laterality: N/A;    SOCIAL HISTORY: Social History   Socioeconomic History   Marital status: Married    Spouse name: Not on file   Number of children: 3   Years of education: Not  on file   Highest education level: Not on file  Occupational History   Occupation: Retired  Tobacco Use   Smoking status: Every Day    Current packs/day: 0.25    Average packs/day: 0.3 packs/day for 44.2 years (11.1 ttl pk-yrs)    Types: Cigarettes    Start date: 04/11/1979   Smokeless tobacco: Never  Vaping Use   Vaping status: Never Used  Substance and Sexual Activity    Alcohol use: Yes    Comment: occasional   Drug use: No   Sexual activity: Not on file  Other Topics Concern   Not on file  Social History Narrative   Married to [employer]; Works for [employer], Engineer, structural. Quit smoking 2018; ocassional alcohol; lives near to [place].    Social Drivers of Health   Financial Resource Strain: Low Risk  (10/15/2022)   Overall Financial Resource Strain (CARDIA)    Difficulty of Paying Living Expenses: Not hard at all  Food Insecurity: No Food Insecurity (10/15/2022)   Hunger Vital Sign    Worried About Running Out of Food in the Last Year: Never true    Ran Out of Food in the Last Year: Never true  Transportation Needs: No Transportation Needs (10/15/2022)   PRAPARE - Administrator, Civil Service (Medical): No    Lack of Transportation (Non-Medical): No  Physical Activity: Inactive (10/15/2022)   Exercise Vital Sign    Days of Exercise per Week: 0 days    Minutes of Exercise per Session: 0 min  Stress: No Stress Concern Present (10/15/2022)   [employer] of Occupational Health - Occupational Stress Questionnaire    Feeling of Stress : Not at all  Social Connections: Moderately Integrated (10/15/2022)   Social Connection and Isolation Panel [NHANES]    Frequency of Communication with Friends and Family: More than three times a week    Frequency of Social Gatherings with Friends and Family: Three times a week    Attends Religious Services: Never    Active Member of Clubs or Organizations: Yes    Attends Banker Meetings: More than 4 times per year    Marital Status: Married  Catering manager Violence: Not At Risk (10/15/2022)   Humiliation, Afraid, Rape, and Kick questionnaire    Fear of Current or Ex-Partner: No    Emotionally Abused: No    Physically Abused: No    Sexually Abused: No    FAMILY HISTORY: Family History  Problem Relation Age of Onset   Heart disease Mother    Arthritis Mother    Cancer  Mother        breast   Hyperlipidemia Mother    Hypertension Mother    Heart attack Mother    Breast cancer Mother 54   Heart disease Father    Diabetes Father    Arthritis Father    Hypertension Father    Parkinson's disease Father    Hodgkin's lymphoma Father        hodgkins disease, prostate   Heart disease Sister    Diabetes Sister    Diabetes Maternal Aunt    Heart disease Maternal Grandmother    Diabetes Maternal Grandmother    Heart disease Maternal Grandfather    Heart disease Paternal Grandmother    Diabetes Paternal Grandmother    Stroke Paternal Grandfather    Heart disease Paternal Grandfather    Diabetes Paternal Grandfather    Breast cancer Cousin        2  pat cousins   Heart disease Brother 85       ami x 8,  4 vessel CABG    Diabetes Brother    Liver disease Brother    Lung disease Brother     ALLERGIES:  is allergic to peanuts [peanut oil], penicillins, sulfa antibiotics, corticosteroids, influenza vac split [influenza virus vaccine], mobic [meloxicam], nickel, nsaids, penicillin g, and prednisone.  MEDICATIONS:  Current Outpatient Medications  Medication Sig Dispense Refill   albuterol (PROAIR HFA) 108 (90 Base) MCG/ACT inhaler Inhale 1-2 puffs into the lungs every 6 (six) hours as needed for wheezing or shortness of breath. 8 g 2   amitriptyline (ELAVIL) 50 MG tablet Take 1 tablet (50 mg total) by mouth at bedtime. 90 tablet 1   buPROPion (WELLBUTRIN) 100 MG tablet Take 1.5 tablets (150 mg total) by mouth 2 (two) times daily. 90 tablet 1   Calcium Carbonate-Vit D-Min (CALCIUM 1200 PO) Take 1 tablet by mouth daily.     carvedilol (COREG) 6.25 MG tablet Take 1 tablet (6.25 mg total) by mouth 2 (two) times daily with a meal. 180 tablet 3   Cholecalciferol (VITAMIN D) 125 MCG (5000 UT) CAPS Take by mouth.     docusate sodium (COLACE) 100 MG capsule Take 100 mg by mouth 2 (two) times daily.     folic acid (FOLVITE) 1 MG tablet Take 1 mg by mouth daily.      gabapentin (NEURONTIN) 300 MG capsule Take 1 capsule (300 mg total) by mouth 3 (three) times daily. 270 capsule 3   HYDROcodone-acetaminophen (NORCO/VICODIN) 5-325 MG tablet Take 1 tablet by mouth 2 (two) times daily as needed for moderate pain (pain score 4-6). 60 tablet 0   HYDROcodone-acetaminophen (NORCO/VICODIN) 5-325 MG tablet Take 1 tablet by mouth 2 (two) times daily as needed for moderate pain (pain score 4-6). 60 tablet 0   isosorbide mononitrate (IMDUR) 30 MG 24 hr tablet Take 1 tablet (30 mg total) by mouth daily. 90 tablet 1   nitroGLYCERIN (NITROSTAT) 0.4 MG SL tablet Place 1 tablet (0.4 mg total) under the tongue every 5 (five) minutes as needed for chest pain. DISSOLVE 1 TABLET UNDER THE TONGUE EVERY 5 MINUTES AS  NEEDED FOR CHEST PAIN. MAX  OF 3 TABLETS IN 15 MINUTES. CALL 911 IF PAIN PERSISTS. 100 tablet 3   omeprazole (PRILOSEC) 40 MG capsule Take 1 capsule (40 mg total) by mouth daily. 90 capsule 1   ondansetron (ZOFRAN) 8 MG tablet Take 1 tablet (8 mg total) by mouth every 8 (eight) hours as needed for nausea or vomiting. 60 tablet 1   Prasterone, DHEA, 10 MG CAPS every other day.     progesterone (PROMETRIUM) 200 MG capsule Take 200 mg by mouth at bedtime.     rOPINIRole (REQUIP) 1 MG tablet TAKE 1 TABLET BY MOUTH THREE TIMES DAILY 90 tablet 0   rosuvastatin (CRESTOR) 10 MG tablet Take 1 tablet (10 mg total) by mouth daily. 90 tablet 3   traMADol (ULTRAM) 50 MG tablet Take 1 tablet by mouth twice daily as needed for pain 60 tablet 2   vitamin C (ASCORBIC ACID) 500 MG tablet Take 500 mg by mouth daily.     oxyCODONE (OXY IR/ROXICODONE) 5 MG immediate release tablet Take by mouth. (Patient not taking: Reported on 06/24/2023)     No current facility-administered medications for this visit.      [name]  PHYSICAL EXAMINATION:   Vitals:   06/24/23 1449  BP: 131/79  Pulse:  94  Resp: 20  Temp: 97.6 F (36.4 C)  SpO2: 100%     Filed Weights   06/24/23 1449  Weight: 132 lb  6.4 oz (60.1 kg)     Physical Exam HENT:     Head: Normocephalic and atraumatic.     Mouth/Throat:     Pharynx: No oropharyngeal exudate.  Eyes:     Pupils: Pupils are equal, round, and reactive to light.  Cardiovascular:     Rate and Rhythm: Normal rate and regular rhythm.  Pulmonary:     Effort: Pulmonary effort is normal. No respiratory distress.     Breath sounds: Normal breath sounds. No wheezing.  Abdominal:     General: Bowel sounds are normal. There is no distension.     Palpations: Abdomen is soft. There is no mass.     Tenderness: There is no abdominal tenderness. There is no guarding or rebound.  Musculoskeletal:        General: No tenderness. Normal range of motion.     Cervical back: Normal range of motion and neck supple.  Skin:    General: Skin is warm.  Neurological:     Mental Status: She is alert and oriented to person, place, and time.  Psychiatric:        Mood and Affect: Affect normal.      LABORATORY DATA:  I have reviewed the data as listed Lab Results  Component Value Date   WBC 9.0 06/24/2023   HGB 8.9 (L) 06/24/2023   HCT 29.2 (L) 06/24/2023   MCV 72.1 (L) 06/24/2023   PLT 273 06/24/2023   Recent Labs    02/20/23 0912 04/23/23 1353 05/14/23 1505 06/24/23 1427  NA 139 139  --  137  K 4.5 4.2  --  4.1  CL 109 105  --  104  CO2 22 22  --  23  GLUCOSE 100* 111*  --  96  BUN 18 16  --  14  CREATININE 0.89 1.18*  --  0.96  CALCIUM 8.9 9.1  --  9.2  GFRNONAA >60 51*  --  >60  PROT  --  7.3 6.8 7.0  ALBUMIN  --  4.7 4.5 4.6  AST  --  20 18 18   ALT  --  19 22 20   ALKPHOS  --  90 90 80  BILITOT  --  1.4* 0.9 1.1  BILIDIR  --   --  0.2  --      LONG TERM MONITOR (3-14 DAYS) Result Date: 06/14/2023 Event monitor Patch Wear Time:  13 days and 23 hours (2024-12-19T12:15:00-0500 to 2025-01-02T12:14:52-0500) Normal sinus rhythm Patient had a min HR of 57 bpm, max HR of 135 bpm, and avg HR of 81 bpm. 1 run of Ventricular Tachycardia  occurred lasting 13 beats with a max rate of 122 bpm (avg 107 bpm). 3 Supraventricular Tachycardia runs occurred, the run with the fastest interval lasting 4 beats with a max rate of 132 bpm, the longest lasting 6 beats with an avg rate of 117 bpm. Isolated SVEs were rare (<1.0%), SVE Couplets were rare (<1.0%), and SVE Triplets were rare (<1.0%). Isolated VEs were rare (<1.0%), VE Couplets were rare (<1.0%), and no VE Triplets were present. Patient triggered events (77) associated with normal sinus rhythm Signed, Dossie Arbour, MD, Ph.D [facility]   ASSESSMENT & PLAN:   Thalassemia minor # Chronic microcytic anemia/history of beta thalassemia - out of proportion to her anemia. S/p IV venoferx4 in fall  of 2017.  # Today hemoglobin is 8.7 saturation 39% ferritin 188 [slightly lower compared to previous]; HOLD Venofer. Continue dietary iron.   # Chronic diarrhea- AUG 2024- [colonoscopy; [facility]]- Anus nodule, biopsy:-Low-grade squamous intraepithelial lesion - s/p NOV 5th- 2024- stable.    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 Distal branches of the celiac artery are patent - awaiting vascuar evaluation in [facility].   # Family Hx: hemochromatosis [brother- cirrhosis- liver cancer-]-   # B12 deficiency - on PO B12;   # active smoker- in process of quitting smoking-   # DISPOSITION: # b12 injection today; NO venofer # follow up in 4 month- MD; - labs- cbc/ bmp; b12; iron studies; ferritin-- possible venofer; and b12 injection- Dr.B.   Cc; Dr.Tullo.     Earna Coder, MD 06/24/2023 4:02 PM

## 2023-06-25 ENCOUNTER — Encounter (HOSPITAL_COMMUNITY): Payer: Self-pay

## 2023-06-26 ENCOUNTER — Ambulatory Visit: Payer: PPO | Admitting: Cardiology

## 2023-06-26 ENCOUNTER — Telehealth (HOSPITAL_COMMUNITY): Payer: Self-pay | Admitting: *Deleted

## 2023-06-26 NOTE — Telephone Encounter (Signed)
Attempted to call patient regarding upcoming cardiac PET appointment. Left message on voicemail with name and callback number  Larey Brick RN Navigator Cardiac Imaging [facility] [phone] Office [phone] Cell  Reminder to avoid caffeine and Imdur prior to her cardiac PET appt.

## 2023-06-27 ENCOUNTER — Ambulatory Visit
Admission: RE | Admit: 2023-06-27 | Discharge: 2023-06-27 | Disposition: A | Discharge status: Home / Self Care | Payer: PPO | Source: Ambulatory Visit | Attending: Cardiology | Admitting: Cardiology

## 2023-06-27 DIAGNOSIS — I700 Atherosclerosis of aorta: Secondary | ICD-10-CM | POA: Diagnosis not present

## 2023-06-27 DIAGNOSIS — J439 Emphysema, unspecified: Secondary | ICD-10-CM | POA: Insufficient documentation

## 2023-06-27 DIAGNOSIS — I25118 Atherosclerotic heart disease of native coronary artery with other forms of angina pectoris: Secondary | ICD-10-CM | POA: Insufficient documentation

## 2023-06-27 DIAGNOSIS — R079 Chest pain, unspecified: Secondary | ICD-10-CM | POA: Diagnosis not present

## 2023-06-27 DIAGNOSIS — I774 Celiac artery compression syndrome: Secondary | ICD-10-CM | POA: Diagnosis not present

## 2023-06-27 DIAGNOSIS — R109 Unspecified abdominal pain: Secondary | ICD-10-CM | POA: Diagnosis not present

## 2023-06-27 LAB — NM PET CT CARDIAC PERFUSION MULTI W/ABSOLUTE BLOODFLOW
LV dias vol: 108 mL (ref 46–106)
MBFR: 2.11
Nuc Rest EF: 34 %
Nuc Stress EF: 46 %
Peak HR: 92 {beats}/min
Rest HR: 68 {beats}/min
Rest MBF: 0.84 ml/g/min
Rest Nuclear Isotope Dose: 15.5 mCi
Rest perfusion cavity size (mL): 108 mL
SRS: 1
SSS: 1
ST Depression (mm): 0 mm
Stress MBF: 1.77 ml/g/min
Stress Nuclear Isotope Dose: 15.5 mCi
Stress perfusion cavity size (mL): 110 mL
TID: 0.93

## 2023-06-27 MED ORDER — RUBIDIUM RB82 GENERATOR (RUBYFILL)
25.0000 | PACK | Freq: Once | INTRAVENOUS | Status: AC
  Administered 2023-06-27: 15.47 via INTRAVENOUS

## 2023-06-27 MED ORDER — REGADENOSON 0.4 MG/5ML IV SOLN
0.4000 mg | Freq: Once | INTRAVENOUS | Status: AC
Start: 2023-06-27 — End: 2023-06-27
  Administered 2023-06-27: 0.4 mg via INTRAVENOUS
  Filled 2023-06-27: qty 5

## 2023-06-27 MED ORDER — RUBIDIUM RB82 GENERATOR (RUBYFILL)
25.0000 | PACK | Freq: Once | INTRAVENOUS | Status: AC
  Administered 2023-06-27: 15.51 via INTRAVENOUS

## 2023-06-27 MED ORDER — REGADENOSON 0.4 MG/5ML IV SOLN
INTRAVENOUS | Status: AC
  Filled 2023-06-27: qty 5

## 2023-06-27 NOTE — Progress Notes (Signed)
Patient presents for a cardiac PET stress test and tolerated procedure without incident. Patient maintained acceptable vital signs throughout the test and was offered caffeine after test.  Patient ambulated out of department with a steady gait.

## 2023-07-02 ENCOUNTER — Encounter: Payer: Self-pay | Admitting: Internal Medicine

## 2023-07-03 ENCOUNTER — Encounter: Payer: Self-pay | Admitting: Internal Medicine

## 2023-07-04 ENCOUNTER — Encounter: Payer: Self-pay | Admitting: Internal Medicine

## 2023-07-04 ENCOUNTER — Encounter: Payer: Self-pay | Admitting: Cardiology

## 2023-07-04 ENCOUNTER — Ambulatory Visit: Payer: PPO | Attending: Cardiology | Admitting: Cardiology

## 2023-07-04 VITALS — BP 100/50 | HR 96 | Ht 63.0 in | Wt 132.0 lb

## 2023-07-04 DIAGNOSIS — Z87891 Personal history of nicotine dependence: Secondary | ICD-10-CM

## 2023-07-04 DIAGNOSIS — I6523 Occlusion and stenosis of bilateral carotid arteries: Secondary | ICD-10-CM | POA: Diagnosis not present

## 2023-07-04 DIAGNOSIS — I774 Celiac artery compression syndrome: Secondary | ICD-10-CM | POA: Diagnosis not present

## 2023-07-04 DIAGNOSIS — R079 Chest pain, unspecified: Secondary | ICD-10-CM | POA: Diagnosis not present

## 2023-07-04 DIAGNOSIS — E782 Mixed hyperlipidemia: Secondary | ICD-10-CM | POA: Diagnosis not present

## 2023-07-04 DIAGNOSIS — R0602 Shortness of breath: Secondary | ICD-10-CM | POA: Diagnosis not present

## 2023-07-04 DIAGNOSIS — I739 Peripheral vascular disease, unspecified: Secondary | ICD-10-CM | POA: Diagnosis not present

## 2023-07-04 DIAGNOSIS — R0609 Other forms of dyspnea: Secondary | ICD-10-CM | POA: Diagnosis not present

## 2023-07-04 NOTE — H&P (View-Only) (Signed)
 Cardiology Office Note:  .   Date:  07/04/2023  ID:  Cheryl [name], DOB 1956-03-03, MRN [id number] PCP: Marylynn Verneita CROME, MD  [facility] Providers Cardiologist:  None    History of Present Illness: Cheryl [name] is a 68 y.o. female with a past medical history of chronic left bundle branch block, smoker x 41 years, obstructive sleep apnea on CPAP, fatigue, peripheral artery disease, aortic atherosclerosis, mixed hyperlipidemia, splenic artery aneurysm, bilateral carotid artery stenosis, no significant coronary artery disease by cardiac catheterization at [facility] in 2003, who is here today for follow-up.   Previous left heart catheterization was completed at [facility] in 2003 reportedly with no significant coronary artery disease at that time.  She had additional episodes of palpitations and chest pain.  For prior episodes of chest pain workup in June 2012 of stress testing/Myoview  showed no ischemia with moderate aortic atherosclerosis.  Repeat Myoview  in 01/30/2019 showed a fixed defect in the anteroseptal, apical region, likely attenuation artifact/bundle branch block though unable to exclude prior MI.  Estimated EF of 40%.  His CT scan in 2019 showed three-vessel coronary calcification with mild aortic arch atherosclerosis.  There have been no significant increase in splenic artery when compared to prior exams.  Mild increase in celiac artery when compared to exam on 11/03/2018 however more closely correlate with studies 09/14/2017 and 08/14/2016.  Ultrasound completed in March 2018 with Dr. Dreama documented greater than 70%, celiac artery disease.  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  She had been following up at [facility] and undergone 4 separate procedures at that time.  She did follow-up with vascular with Dr. Jama for celiac artery stenosis and a tried access to the right  groin and the left arm was unsuccessful which ended up sending her to [facility] for further evaluation.   She was last seen in clinic 05/13/2023 which she continued to have complaints of shortness of breath and occasional vertigo with her daytime activities.  Symptoms have been recurrent since her MI last surgery in April 2024.  She also stated she had some precordial chest discomfort associated shortness of breath and palpitations.  PCP had started her on Imdur  30 mg daily for chest pain and given her sublingual nitro to take as needed for pain.  She was scheduled for cardiac PET stress, an echocardiogram, and a ZIO XT monitor.  Patient had office visit with vascular at [facility] on 06/27/2023 and was advised stated that she had had a small postsurgical pseudoaneurysm that appears to be increasing in size compared to prior CT.  Continue to plan for celiac artery stent placement that was going to be scheduled but today it was not determined.  She returns to clinic today with continued chest pain and shortness of breath and occasional vertigo with her daytime activities.  She stated that her symptoms have been occurring since her MALS surgery in April 2024.  She recently underwent cardiac PET stress testing which was concerning for infarct.  Patient has had prior heart catheterizations completed which showed no significant coronary artery disease.  She continues to describe her chest discomfort as a heaviness something sitting on her chest that prevents her from being able to take a deep breath.  This is not only with exertion but has now progressed to again at rest.  The shortness of breath has worsened since last time  that she was seen in clinic with activity or even at rest.  She denies any orthopnea or PND.  She continues to have some occasional palpitations that come with rest or exertion but she is primarily noticed them in the late afternoon and early evening.  She has also recently been advised that she will need to  have another surgery at [facility] as where she previously had celiac stenting her last abdominal CT revealed a pseudoaneurysm that will require another stent placement.  She states that she has been compliant with her current medication regimen without any adverse effects.  She denies any hospitalizations or visits to the emergency department.  ROS: 10 point review of systems has been reviewed and considered negative except what is been listed in the HPI  Studies Reviewed: .       Cardiac PET stress 06/27/2023 Narrative & Impression      Small, mild, fixed apical defect that either represents infarction or apical thinning artifact. MBFR is normal favoring artifact. LVEF is reduced with septal movement consistent with LBBB. Intermediate risk study based on LVEF alone. Would recommend an echocardiogram for clarification.   LV perfusion is abnormal. There is no evidence of ischemia. There is evidence of infarction. Defect 1: There is a small defect with mild reduction in uptake present in the apex location(s) that is fixed. Consistent with infarction.   Rest left ventricular function is abnormal. Rest global function is moderately reduced. There were no regional wall motion abnormalities. Rest EF: 34%. Stress left ventricular function is abnormal. Stress global function is mildly reduced. There were no regional wall abnormalities. Stress EF: 46%. End diastolic cavity size is normal.   Myocardial blood flow was computed to be 0.42ml/g/min at rest and 1.77ml/g/min at stress. Global myocardial blood flow reserve was 2.11 and was normal.   Coronary calcium  was present on the attenuation correction CT images. Severe coronary calcifications were present. Coronary calcifications were present in the left anterior descending artery and left circumflex artery distribution(s).   Findings are consistent with infarction. The study is intermediate risk.   Event Monitor (Zio) 06/07/2023 Normal sinus rhythm Patient had a min  HR of 57 bpm, max HR of 135 bpm, and avg HR of 81 bpm.    1 run of Ventricular Tachycardia occurred lasting 13 beats with a max rate of 122 bpm (avg 107 bpm).    3 Supraventricular Tachycardia runs occurred, the run with the fastest interval lasting 4 beats with a max rate of 132 bpm, the longest lasting 6 beats with an avg rate of 117 bpm.    Isolated SVEs were rare (<1.0%), SVE Couplets were rare (<1.0%), and SVE Triplets were rare (<1.0%).  Isolated VEs were rare (<1.0%), VE Couplets were rare (<1.0%), and no VE Triplets were present.   Patient triggered events (77) associated with normal sinus rhythm  Lexiscan  MPI 01/30/2019 Pharmacological myocardial perfusion imaging study with no significant  Ischemia Fixed defect in the anteroseptal, apical region, likely attenuation artifact/bundle branch block, though unable to exclude prior MI Anteroseptal wall hypokinesis, secondary to bundle branch block,  EF estimated at 40% No EKG changes concerning for ischemia at peak stress or in recovery. Baseline EKG with LBBB Low risk scan Study compared to prior study dated 10/2010, no significant change noted, prior defect detailed above secondary to LBBB was previously noted. Risk Assessment/Calculations:             Physical Exam:   VS:  BP (!) 100/50 (BP Location:  Left Arm)   Pulse 96   Ht 5' 3 (1.6 m)   Wt 132 lb (59.9 kg)   SpO2 97%   BMI 23.38 kg/m    Wt Readings from Last 3 Encounters:  07/04/23 132 lb (59.9 kg)  06/24/23 132 lb 6.4 oz (60.1 kg)  05/14/23 131 lb (59.4 kg)    GEN: Well nourished, well developed in no acute distress NECK: No JVD; No carotid bruits CARDIAC: RRR, no murmurs, rubs, gallops RESPIRATORY:  Clear to auscultation without rales, wheezing or rhonchi  ABDOMEN: Soft, non-tender, non-distended EXTREMITIES:  No edema; No deformity   ASSESSMENT AND PLAN: .   Precordial chest pain that she states has been off and on for the last several weeks but primarily since  dating back to April 2024.  She was recently given Imdur  and Nitrostat  by her PCP and was scheduled for a cardiac PET stress that revealed concerning areas of infarct and a reduced LVEF likely compatible with her left bundle branch block.  The findings were consistent with infarction and there was severe coronary calcification noted in the left anterior descending and left circumflex artery distributions.  It was also recommended she have a follow-up echocardiogram which has been scheduled.  Unfortunately with her symptoms and concerning test results we did discuss a right and left heart catheterization that will be scheduled.  She was concerned with upcoming procedure being scheduled from vascular at [facility] she had been advised that during heart catheterization if there are any abnormalities that are fixed with stent placement typically she would require dapt that would not be able to be stopped for minimum of 3 months but ideally 6 months prior to any other procedure needed.  Continued shortness of breath and dyspnea on exertion that is progressively worsening.  She is being scheduled for an echocardiogram to evaluate LVEF and structural abnormalities.  Mixed hyperlipidemia with an LDL 41 at goal.  She is continued on rosuvastatin  10 mg daily.  Hypertension with a blood pressure today of 102/50 with some questionable orthostasis.  Her dizziness is slightly improved today she has been continued on carvedilol  6.25 mg twice daily and her Imdur  30 mg daily.  She has been encouraged to continue to monitor blood pressures 1 to 2 hours postmedication administration as well.  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  Last mesenteric Doppler done at [facility] in July 2024.  Recent scan completed prior to her upcoming appointment which revealed a celiac artery pseudoaneurysm requiring further procedures to be completed at [facility].  Peripheral arterial disease/carotid artery stenosis which she is  encouraged to continue with aggressive management of blood pressure and cholesterol.  She is continued on rosuvastatin  10 mg daily.  Tobacco abuse but she continues to smoke with total cessation being recommended.    Informed Consent   Shared Decision Making/Informed Consent The risks [stroke (1 in 1000), death (1 in 1000), kidney failure [usually temporary] (1 in 500), bleeding (1 in 200), allergic reaction [possibly serious] (1 in 200)], benefits (diagnostic support and management of coronary artery disease) and alternatives of a cardiac catheterization were discussed in detail with Ms. Achey and she is willing to proceed.     Dispo: Patient to return to see MD/APP 2 to 3 weeks postprocedure or sooner if needed  Signed, Neymar Dowe, NP

## 2023-07-04 NOTE — Patient Instructions (Signed)
 Medication Instructions:  No changes at this time.   *If you need a refill on your cardiac medications before your next appointment, please call your pharmacy*   Lab Work: None  If you have labs (blood work) drawn today and your tests are completely normal, you will receive your results only by: MyChart Message (if you have MyChart) OR A paper copy in the mail If you have any lab test that is abnormal or we need to change your treatment, we will call you to review the results.   Testing/Procedures: Your physician has requested that you have an echocardiogram. Echocardiography is a painless test that uses sound waves to create images of your heart. It provides your doctor with information about the size and shape of your heart and how well your heart's chambers and valves are working. This procedure takes approximately one hour. There are no restrictions for this procedure. Please do NOT wear cologne, perfume, aftershave, or lotions (deodorant is allowed). Please arrive 15 minutes prior to your appointment time.  Please note: We ask at that you not bring children with you during ultrasound (echo/ vascular) testing. Due to room size and safety concerns, children are not allowed in the ultrasound rooms during exams. Our front office staff cannot provide observation of children in our lobby area while testing is being conducted. An adult accompanying a patient to their appointment will only be allowed in the ultrasound room at the discretion of the ultrasound technician under special circumstances. We apologize for any inconvenience.   [facility] A DEPT OF [place] AT [facility] [address] [place] [address] Dept: [phone] Loc: [phone]  Aiysha Jillson  07/04/2023  You are scheduled for a Cardiac Catheterization on Pending, Pending Pending with Dr. Pending.  1. Please arrive at the Heart &  Vascular Center Entrance of [facility], [address], [place] [address] at Pending (This is Pending hour(s) prior to your procedure time).  Proceed to the Check-In Desk directly inside the entrance.  Procedure Parking: Use the entrance off of the [facility] [address] side of the hospital. Turn right upon entering and follow the driveway to parking that is directly in front of the Heart & Vascular Center. There is no valet parking available at this entrance, however there is an awning directly in front of the Heart & Vascular Center for drop off/ pick up for patients.  Special note: Every effort is made to have your procedure done on time. Please understand that emergencies sometimes delay scheduled procedures.  2. Diet: Do not eat solid foods after midnight.  The patient may have clear liquids until 5am upon the day of the procedure.  3. Labs: Already completed  4. Medication instructions in preparation for your procedure:   Contrast Allergy: No   On the morning of your procedure, take your Aspirin  81 mg and any morning medicines NOT listed above.  You may use sips of water.  5. Plan to go home the same day, you will only stay overnight if medically necessary. 6. Bring a current list of your medications and current insurance cards. 7. You MUST have a responsible person to drive you home. 8. Someone MUST be with you the first 24 hours after you arrive home or your discharge will be delayed. 9. Please wear clothes that are easy to get on and off and wear slip-on shoes.  Thank you for allowing us  to care for you!   --  [facility]    Follow-Up: At [facility], you and your health needs are our priority.  As part of our continuing mission to provide you with exceptional heart care, we have created designated Provider Care Teams.  These Care Teams include your primary Cardiologist (physician) and Advanced Practice Providers (APPs -  Physician Assistants  and Nurse Practitioners) who all work together to provide you with the care you need, when you need it.   Your next appointment:   2-3  week(s) after we schedule your heart cath  Provider:   Tylene Lunch, NP

## 2023-07-04 NOTE — Progress Notes (Signed)
 Cardiology Office Note:  .   Date:  07/04/2023  ID:  Cheryl [name], DOB 1956-03-03, MRN [id number] PCP: Marylynn Verneita CROME, MD  [facility] Providers Cardiologist:  None    History of Present Illness: Cheryl [name] is a 68 y.o. female with a past medical history of chronic left bundle branch block, smoker x 41 years, obstructive sleep apnea on CPAP, fatigue, peripheral artery disease, aortic atherosclerosis, mixed hyperlipidemia, splenic artery aneurysm, bilateral carotid artery stenosis, no significant coronary artery disease by cardiac catheterization at [facility] in 2003, who is here today for follow-up.   Previous left heart catheterization was completed at [facility] in 2003 reportedly with no significant coronary artery disease at that time.  She had additional episodes of palpitations and chest pain.  For prior episodes of chest pain workup in June 2012 of stress testing/Myoview  showed no ischemia with moderate aortic atherosclerosis.  Repeat Myoview  in 01/30/2019 showed a fixed defect in the anteroseptal, apical region, likely attenuation artifact/bundle branch block though unable to exclude prior MI.  Estimated EF of 40%.  His CT scan in 2019 showed three-vessel coronary calcification with mild aortic arch atherosclerosis.  There have been no significant increase in splenic artery when compared to prior exams.  Mild increase in celiac artery when compared to exam on 11/03/2018 however more closely correlate with studies 09/14/2017 and 08/14/2016.  Ultrasound completed in March 2018 with Dr. Dreama documented greater than 70%, celiac artery disease.  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  She had been following up at [facility] and undergone 4 separate procedures at that time.  She did follow-up with vascular with Dr. Jama for celiac artery stenosis and a tried access to the right  groin and the left arm was unsuccessful which ended up sending her to [facility] for further evaluation.   She was last seen in clinic 05/13/2023 which she continued to have complaints of shortness of breath and occasional vertigo with her daytime activities.  Symptoms have been recurrent since her MI last surgery in April 2024.  She also stated she had some precordial chest discomfort associated shortness of breath and palpitations.  PCP had started her on Imdur  30 mg daily for chest pain and given her sublingual nitro to take as needed for pain.  She was scheduled for cardiac PET stress, an echocardiogram, and a ZIO XT monitor.  Patient had office visit with vascular at [facility] on 06/27/2023 and was advised stated that she had had a small postsurgical pseudoaneurysm that appears to be increasing in size compared to prior CT.  Continue to plan for celiac artery stent placement that was going to be scheduled but today it was not determined.  She returns to clinic today with continued chest pain and shortness of breath and occasional vertigo with her daytime activities.  She stated that her symptoms have been occurring since her MALS surgery in April 2024.  She recently underwent cardiac PET stress testing which was concerning for infarct.  Patient has had prior heart catheterizations completed which showed no significant coronary artery disease.  She continues to describe her chest discomfort as a heaviness something sitting on her chest that prevents her from being able to take a deep breath.  This is not only with exertion but has now progressed to again at rest.  The shortness of breath has worsened since last time  that she was seen in clinic with activity or even at rest.  She denies any orthopnea or PND.  She continues to have some occasional palpitations that come with rest or exertion but she is primarily noticed them in the late afternoon and early evening.  She has also recently been advised that she will need to  have another surgery at [facility] as where she previously had celiac stenting her last abdominal CT revealed a pseudoaneurysm that will require another stent placement.  She states that she has been compliant with her current medication regimen without any adverse effects.  She denies any hospitalizations or visits to the emergency department.  ROS: 10 point review of systems has been reviewed and considered negative except what is been listed in the HPI  Studies Reviewed: .       Cardiac PET stress 06/27/2023 Narrative & Impression      Small, mild, fixed apical defect that either represents infarction or apical thinning artifact. MBFR is normal favoring artifact. LVEF is reduced with septal movement consistent with LBBB. Intermediate risk study based on LVEF alone. Would recommend an echocardiogram for clarification.   LV perfusion is abnormal. There is no evidence of ischemia. There is evidence of infarction. Defect 1: There is a small defect with mild reduction in uptake present in the apex location(s) that is fixed. Consistent with infarction.   Rest left ventricular function is abnormal. Rest global function is moderately reduced. There were no regional wall motion abnormalities. Rest EF: 34%. Stress left ventricular function is abnormal. Stress global function is mildly reduced. There were no regional wall abnormalities. Stress EF: 46%. End diastolic cavity size is normal.   Myocardial blood flow was computed to be 0.42ml/g/min at rest and 1.77ml/g/min at stress. Global myocardial blood flow reserve was 2.11 and was normal.   Coronary calcium  was present on the attenuation correction CT images. Severe coronary calcifications were present. Coronary calcifications were present in the left anterior descending artery and left circumflex artery distribution(s).   Findings are consistent with infarction. The study is intermediate risk.   Event Monitor (Zio) 06/07/2023 Normal sinus rhythm Patient had a min  HR of 57 bpm, max HR of 135 bpm, and avg HR of 81 bpm.    1 run of Ventricular Tachycardia occurred lasting 13 beats with a max rate of 122 bpm (avg 107 bpm).    3 Supraventricular Tachycardia runs occurred, the run with the fastest interval lasting 4 beats with a max rate of 132 bpm, the longest lasting 6 beats with an avg rate of 117 bpm.    Isolated SVEs were rare (<1.0%), SVE Couplets were rare (<1.0%), and SVE Triplets were rare (<1.0%).  Isolated VEs were rare (<1.0%), VE Couplets were rare (<1.0%), and no VE Triplets were present.   Patient triggered events (77) associated with normal sinus rhythm  Lexiscan  MPI 01/30/2019 Pharmacological myocardial perfusion imaging study with no significant  Ischemia Fixed defect in the anteroseptal, apical region, likely attenuation artifact/bundle branch block, though unable to exclude prior MI Anteroseptal wall hypokinesis, secondary to bundle branch block,  EF estimated at 40% No EKG changes concerning for ischemia at peak stress or in recovery. Baseline EKG with LBBB Low risk scan Study compared to prior study dated 10/2010, no significant change noted, prior defect detailed above secondary to LBBB was previously noted. Risk Assessment/Calculations:             Physical Exam:   VS:  BP (!) 100/50 (BP Location:  Left Arm)   Pulse 96   Ht 5' 3 (1.6 m)   Wt 132 lb (59.9 kg)   SpO2 97%   BMI 23.38 kg/m    Wt Readings from Last 3 Encounters:  07/04/23 132 lb (59.9 kg)  06/24/23 132 lb 6.4 oz (60.1 kg)  05/14/23 131 lb (59.4 kg)    GEN: Well nourished, well developed in no acute distress NECK: No JVD; No carotid bruits CARDIAC: RRR, no murmurs, rubs, gallops RESPIRATORY:  Clear to auscultation without rales, wheezing or rhonchi  ABDOMEN: Soft, non-tender, non-distended EXTREMITIES:  No edema; No deformity   ASSESSMENT AND PLAN: .   Precordial chest pain that she states has been off and on for the last several weeks but primarily since  dating back to April 2024.  She was recently given Imdur  and Nitrostat  by her PCP and was scheduled for a cardiac PET stress that revealed concerning areas of infarct and a reduced LVEF likely compatible with her left bundle branch block.  The findings were consistent with infarction and there was severe coronary calcification noted in the left anterior descending and left circumflex artery distributions.  It was also recommended she have a follow-up echocardiogram which has been scheduled.  Unfortunately with her symptoms and concerning test results we did discuss a right and left heart catheterization that will be scheduled.  She was concerned with upcoming procedure being scheduled from vascular at [facility] she had been advised that during heart catheterization if there are any abnormalities that are fixed with stent placement typically she would require dapt that would not be able to be stopped for minimum of 3 months but ideally 6 months prior to any other procedure needed.  Continued shortness of breath and dyspnea on exertion that is progressively worsening.  She is being scheduled for an echocardiogram to evaluate LVEF and structural abnormalities.  Mixed hyperlipidemia with an LDL 41 at goal.  She is continued on rosuvastatin  10 mg daily.  Hypertension with a blood pressure today of 102/50 with some questionable orthostasis.  Her dizziness is slightly improved today she has been continued on carvedilol  6.25 mg twice daily and her Imdur  30 mg daily.  She has been encouraged to continue to monitor blood pressures 1 to 2 hours postmedication administration as well.  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  Last mesenteric Doppler done at [facility] in July 2024.  Recent scan completed prior to her upcoming appointment which revealed a celiac artery pseudoaneurysm requiring further procedures to be completed at [facility].  Peripheral arterial disease/carotid artery stenosis which she is  encouraged to continue with aggressive management of blood pressure and cholesterol.  She is continued on rosuvastatin  10 mg daily.  Tobacco abuse but she continues to smoke with total cessation being recommended.    Informed Consent   Shared Decision Making/Informed Consent The risks [stroke (1 in 1000), death (1 in 1000), kidney failure [usually temporary] (1 in 500), bleeding (1 in 200), allergic reaction [possibly serious] (1 in 200)], benefits (diagnostic support and management of coronary artery disease) and alternatives of a cardiac catheterization were discussed in detail with Ms. Achey and she is willing to proceed.     Dispo: Patient to return to see MD/APP 2 to 3 weeks postprocedure or sooner if needed  Signed, Neymar Dowe, NP

## 2023-07-05 ENCOUNTER — Telehealth: Payer: Self-pay | Admitting: *Deleted

## 2023-07-05 DIAGNOSIS — R0609 Other forms of dyspnea: Secondary | ICD-10-CM

## 2023-07-05 NOTE — Telephone Encounter (Signed)
 The patient has been scheduled for a right and left heart cath on 2/17 with Dr. Darron.   Instructions have been provided and she verbalized her understanding.    You are scheduled for a Cardiac Catheterization on Monday, February 10 with Dr. Deatrice Darron.  1. Please arrive at the [facility] Entrance of [facility], [address], [place] [address] at 8:30 AM (This is 1 hour(s) prior to your procedure time).  Proceed to the Check-In Desk directly inside the entrance.  Procedure Parking: Use the entrance off of the [address] side of the hospital. Turn right upon entering and follow the driveway to parking that is directly in front of the Heart [facility] Vascular Center. There is no valet parking available at this entrance, however there is an awning directly in front of the Heart & Vascular Center for drop off/ pick up for patients.  Special note: Every effort is made to have your procedure done on time. Please understand that emergencies sometimes delay scheduled procedures.  2. Diet: Do not eat solid foods after midnight.  The patient may have clear liquids until 5am upon the day of the procedure.  3. Labs: You will need to have blood drawn by 07/10/23. You do not need to be fasting.  Please go to [facility] [address] ([facility], [place] [address] You do not need an appointment.  They are open from 8 am- 4:30 pm.  Lunch from 1:00 pm- 2:00 pm You will not need to be fasting.   4. Medication instructions in preparation for your procedure: Nothing to hold   On the morning of your procedure, take your Aspirin  81 mg and any morning medicines NOT listed above.  You may use sips of water.  5. Plan to go home the same day, you will only stay overnight if medically necessary. 6. Bring a current list of your medications and current insurance cards. 7. You MUST have a responsible person to drive you home. 8. Someone MUST be with you the first 24  hours after you arrive home or your discharge will be delayed. 9. Please wear clothes that are easy to get on and off and wear slip-on shoes.  Thank you for allowing us  to care for you!   -- [facility]

## 2023-07-05 NOTE — Telephone Encounter (Signed)
 Left a message for the patient to call back to schedule her cardiac cath.

## 2023-07-05 NOTE — Telephone Encounter (Signed)
 Patient was returning call. Please advise ?

## 2023-07-09 ENCOUNTER — Telehealth: Payer: Self-pay | Admitting: *Deleted

## 2023-07-09 DIAGNOSIS — R0609 Other forms of dyspnea: Secondary | ICD-10-CM | POA: Diagnosis not present

## 2023-07-09 DIAGNOSIS — R9439 Abnormal result of other cardiovascular function study: Secondary | ICD-10-CM

## 2023-07-09 DIAGNOSIS — R079 Chest pain, unspecified: Secondary | ICD-10-CM

## 2023-07-09 NOTE — Telephone Encounter (Signed)
EKG needed for her upcoming procedure. Order placed and patient scheduled.

## 2023-07-09 NOTE — Telephone Encounter (Signed)
Patient is returning call. Transferred to Rinaldo Cloud, Charity fundraiser.

## 2023-07-09 NOTE — Telephone Encounter (Signed)
Left voicemail message to call back.   Called patient to schedule EKG at the [facility] entrance.

## 2023-07-09 NOTE — Telephone Encounter (Signed)
Spoke with patient and advised that we do need EKG to be done before her procedure. She verbalized understanding and scheduled for her to have it on Thursday at 2 pm over at the [facility] entranced.

## 2023-07-10 ENCOUNTER — Other Ambulatory Visit: Payer: Self-pay | Admitting: Internal Medicine

## 2023-07-10 LAB — CBC
Hematocrit: 28.1 % — ABNORMAL LOW (ref 34.0–46.6)
Hemoglobin: 8.6 g/dL — ABNORMAL LOW (ref 11.1–15.9)
MCH: 22.2 pg — ABNORMAL LOW (ref 26.6–33.0)
MCHC: 30.6 g/dL — ABNORMAL LOW (ref 31.5–35.7)
MCV: 73 fL — ABNORMAL LOW (ref 79–97)
NRBC: 2 % — ABNORMAL HIGH (ref 0–0)
Platelets: 322 10*3/uL (ref 150–450)
RBC: 3.87 x10E6/uL (ref 3.77–5.28)
RDW: 18.1 % — ABNORMAL HIGH (ref 11.7–15.4)
WBC: 7.8 10*3/uL (ref 3.4–10.8)

## 2023-07-10 LAB — BASIC METABOLIC PANEL
BUN/Creatinine Ratio: 9 — ABNORMAL LOW (ref 12–28)
BUN: 8 mg/dL (ref 8–27)
CO2: 23 mmol/L (ref 20–29)
Calcium: 8.8 mg/dL (ref 8.7–10.3)
Chloride: 105 mmol/L (ref 96–106)
Creatinine, Ser: 0.93 mg/dL (ref 0.57–1.00)
Glucose: 109 mg/dL — ABNORMAL HIGH (ref 70–99)
Potassium: 4.2 mmol/L (ref 3.5–5.2)
Sodium: 139 mmol/L (ref 134–144)
eGFR: 67 mL/min/{1.73_m2} (ref >59)

## 2023-07-10 MED ORDER — ROPINIROLE HCL 1 MG PO TABS: 1.0000 mg | ORAL_TABLET | Freq: Three times a day (TID) | ORAL | 1 refills | Status: DC

## 2023-07-11 ENCOUNTER — Ambulatory Visit
Admission: RE | Admit: 2023-07-11 | Discharge: 2023-07-11 | Disposition: A | Discharge status: Home / Self Care | Payer: HMO | Source: Ambulatory Visit | Attending: Cardiology | Admitting: Cardiology

## 2023-07-11 DIAGNOSIS — Z01810 Encounter for preprocedural cardiovascular examination: Secondary | ICD-10-CM | POA: Insufficient documentation

## 2023-07-11 DIAGNOSIS — I447 Left bundle-branch block, unspecified: Secondary | ICD-10-CM | POA: Diagnosis not present

## 2023-07-11 DIAGNOSIS — R9430 Abnormal result of cardiovascular function study, unspecified: Secondary | ICD-10-CM | POA: Diagnosis not present

## 2023-07-11 DIAGNOSIS — Z01818 Encounter for other preprocedural examination: Secondary | ICD-10-CM | POA: Diagnosis present

## 2023-07-11 NOTE — Progress Notes (Signed)
Pre-procedure labs. Blood counts, kidney function, and electrolytes have remained stable.

## 2023-07-15 ENCOUNTER — Ambulatory Visit
Admission: RE | Admit: 2023-07-15 | Discharge: 2023-07-15 | Disposition: A | Discharge status: Home / Self Care | Payer: HMO | Attending: Cardiovascular Disease | Admitting: Cardiovascular Disease

## 2023-07-15 ENCOUNTER — Encounter: Payer: Self-pay | Admitting: Cardiovascular Disease

## 2023-07-15 ENCOUNTER — Encounter
Admission: RE | Disposition: A | Discharge status: Home / Self Care | Payer: HMO | Source: Home / Self Care | Attending: Cardiovascular Disease

## 2023-07-15 DIAGNOSIS — I447 Left bundle-branch block, unspecified: Secondary | ICD-10-CM | POA: Insufficient documentation

## 2023-07-15 DIAGNOSIS — I5022 Chronic systolic (congestive) heart failure: Secondary | ICD-10-CM | POA: Diagnosis not present

## 2023-07-15 DIAGNOSIS — I428 Other cardiomyopathies: Secondary | ICD-10-CM | POA: Insufficient documentation

## 2023-07-15 DIAGNOSIS — R9439 Abnormal result of other cardiovascular function study: Secondary | ICD-10-CM | POA: Diagnosis not present

## 2023-07-15 DIAGNOSIS — I739 Peripheral vascular disease, unspecified: Secondary | ICD-10-CM | POA: Insufficient documentation

## 2023-07-15 DIAGNOSIS — I771 Stricture of artery: Secondary | ICD-10-CM | POA: Insufficient documentation

## 2023-07-15 DIAGNOSIS — G4733 Obstructive sleep apnea (adult) (pediatric): Secondary | ICD-10-CM | POA: Insufficient documentation

## 2023-07-15 DIAGNOSIS — I2510 Atherosclerotic heart disease of native coronary artery without angina pectoris: Secondary | ICD-10-CM | POA: Insufficient documentation

## 2023-07-15 DIAGNOSIS — I110 Hypertensive heart disease with heart failure: Secondary | ICD-10-CM | POA: Diagnosis not present

## 2023-07-15 DIAGNOSIS — R079 Chest pain, unspecified: Secondary | ICD-10-CM

## 2023-07-15 DIAGNOSIS — Z79899 Other long term (current) drug therapy: Secondary | ICD-10-CM | POA: Insufficient documentation

## 2023-07-15 DIAGNOSIS — E782 Mixed hyperlipidemia: Secondary | ICD-10-CM | POA: Insufficient documentation

## 2023-07-15 DIAGNOSIS — Z87891 Personal history of nicotine dependence: Secondary | ICD-10-CM | POA: Insufficient documentation

## 2023-07-15 HISTORY — PX: RIGHT/LEFT HEART CATH AND CORONARY ANGIOGRAPHY: CATH118266

## 2023-07-15 LAB — POCT I-STAT 7, (LYTES, BLD GAS, ICA,H+H)
Acid-base deficit: 2 mmol/L (ref 0.0–2.0)
Bicarbonate: 22.8 mmol/L (ref 20.0–28.0)
Calcium, Ion: 1.24 mmol/L (ref 1.15–1.40)
HCT: 24 % — ABNORMAL LOW (ref 36.0–46.0)
Hemoglobin: 8.2 g/dL — ABNORMAL LOW (ref 12.0–15.0)
O2 Saturation: 96 %
Potassium: 4 mmol/L (ref 3.5–5.1)
Sodium: 142 mmol/L (ref 135–145)
TCO2: 24 mmol/L (ref 22–32)
pCO2 arterial: 38.3 mm[Hg] (ref 32–48)
pH, Arterial: 7.382 (ref 7.35–7.45)
pO2, Arterial: 83 mm[Hg] (ref 83–108)

## 2023-07-15 LAB — POCT I-STAT EG7
Acid-base deficit: 2 mmol/L (ref 0.0–2.0)
Bicarbonate: 23.7 mmol/L (ref 20.0–28.0)
Calcium, Ion: 1.27 mmol/L (ref 1.15–1.40)
HCT: 24 % — ABNORMAL LOW (ref 36.0–46.0)
Hemoglobin: 8.2 g/dL — ABNORMAL LOW (ref 12.0–15.0)
O2 Saturation: 62 %
Potassium: 4 mmol/L (ref 3.5–5.1)
Sodium: 142 mmol/L (ref 135–145)
TCO2: 25 mmol/L (ref 22–32)
pCO2, Ven: 42.5 mm[Hg] — ABNORMAL LOW (ref 44–60)
pH, Ven: 7.354 (ref 7.25–7.43)
pO2, Ven: 34 mm[Hg] (ref 32–45)

## 2023-07-15 SURGERY — RIGHT/LEFT HEART CATH AND CORONARY ANGIOGRAPHY
Anesthesia: Moderate Sedation | Laterality: Bilateral

## 2023-07-15 MED ORDER — FENTANYL CITRATE (PF) 100 MCG/2ML IJ SOLN
INTRAMUSCULAR | Status: AC
  Filled 2023-07-15: qty 2

## 2023-07-15 MED ORDER — SODIUM CHLORIDE 0.9 % IV SOLN
INTRAVENOUS | Status: DC
  Administered 2023-07-15: 500 mL via INTRAVENOUS

## 2023-07-15 MED ORDER — NITROGLYCERIN 1 MG/10 ML FOR IR/CATH LAB
INTRA_ARTERIAL | Status: AC
  Filled 2023-07-15: qty 10

## 2023-07-15 MED ORDER — VERAPAMIL HCL 2.5 MG/ML IV SOLN
INTRAVENOUS | Status: AC
  Filled 2023-07-15: qty 2

## 2023-07-15 MED ORDER — MIDAZOLAM HCL 2 MG/2ML IJ SOLN
INTRAMUSCULAR | Status: DC | PRN
  Administered 2023-07-15: 1 mg via INTRAVENOUS

## 2023-07-15 MED ORDER — MIDAZOLAM HCL 2 MG/2ML IJ SOLN
INTRAMUSCULAR | Status: AC
  Filled 2023-07-15: qty 2

## 2023-07-15 MED ORDER — ASPIRIN 81 MG PO CHEW
CHEWABLE_TABLET | ORAL | Status: AC
  Filled 2023-07-15: qty 1

## 2023-07-15 MED ORDER — HEPARIN SODIUM (PORCINE) 1000 UNIT/ML IJ SOLN
INTRAMUSCULAR | Status: DC | PRN
  Administered 2023-07-15: 3000 [IU] via INTRAVENOUS

## 2023-07-15 MED ORDER — HEPARIN (PORCINE) IN NACL 1000-0.9 UT/500ML-% IV SOLN
INTRAVENOUS | Status: AC
  Filled 2023-07-15: qty 1000

## 2023-07-15 MED ORDER — ASPIRIN 81 MG PO CHEW
81.0000 mg | CHEWABLE_TABLET | ORAL | Status: AC
  Administered 2023-07-15: 81 mg via ORAL

## 2023-07-15 MED ORDER — HEPARIN (PORCINE) IN NACL 2000-0.9 UNIT/L-% IV SOLN
INTRAVENOUS | Status: DC | PRN
  Administered 2023-07-15: 1000 mL

## 2023-07-15 MED ORDER — NITROGLYCERIN 1 MG/10 ML FOR IR/CATH LAB
INTRA_ARTERIAL | Status: DC | PRN
  Administered 2023-07-15: 100 ug

## 2023-07-15 MED ORDER — LIDOCAINE HCL 1 % IJ SOLN
INTRAMUSCULAR | Status: AC
  Filled 2023-07-15: qty 20

## 2023-07-15 MED ORDER — HEPARIN SODIUM (PORCINE) 1000 UNIT/ML IJ SOLN
INTRAMUSCULAR | Status: AC
  Filled 2023-07-15: qty 10

## 2023-07-15 MED ORDER — FENTANYL CITRATE (PF) 100 MCG/2ML IJ SOLN
INTRAMUSCULAR | Status: DC | PRN
  Administered 2023-07-15: 25 ug via INTRAVENOUS

## 2023-07-15 MED ORDER — VERAPAMIL HCL 2.5 MG/ML IV SOLN
INTRAVENOUS | Status: DC | PRN
  Administered 2023-07-15: 2.5 mg via INTRAVENOUS

## 2023-07-15 MED ORDER — IOHEXOL 300 MG/ML  SOLN
INTRAMUSCULAR | Status: DC | PRN
  Administered 2023-07-15: 64 mL

## 2023-07-15 MED ORDER — LIDOCAINE HCL (PF) 1 % IJ SOLN
INTRAMUSCULAR | Status: DC | PRN
  Administered 2023-07-15: 2 mL

## 2023-07-15 SURGICAL SUPPLY — 13 items
CATH BALLN WEDGE 5F 110CM (CATHETERS) IMPLANT
CATH INFINITI 5FR ANG PIGTAIL (CATHETERS) IMPLANT
CATH INFINITI AMBI 5FR JK (CATHETERS) IMPLANT
DEVICE RAD TR BAND REGULAR (VASCULAR PRODUCTS) IMPLANT
DRAPE BRACHIAL (DRAPES) IMPLANT
GLIDESHEATH SLEND SS 6F .021 (SHEATH) IMPLANT
GUIDEWIRE INQWIRE 1.5J.035X260 (WIRE) IMPLANT
INQWIRE 1.5J .035X260CM (WIRE) ×1 IMPLANT
PACK CARDIAC CATH (CUSTOM PROCEDURE TRAY) ×1 IMPLANT
PROTECTION STATION PRESSURIZED (MISCELLANEOUS) ×1 IMPLANT
SET ATX-X65L (MISCELLANEOUS) IMPLANT
SHEATH GLIDE SLENDER 4/5FR (SHEATH) IMPLANT
STATION PROTECTION PRESSURIZED (MISCELLANEOUS) IMPLANT

## 2023-07-15 NOTE — Interval H&P Note (Signed)
 History and Physical Interval Note:  07/15/2023 9:35 AM  Cheryl [name]  has presented today for surgery, with the diagnosis of R and L Cath    Abnormal stress test.  The various methods of treatment have been discussed with the patient and family. After consideration of risks, benefits and other options for treatment, the patient has consented to  Procedure(s): RIGHT/LEFT HEART CATH AND CORONARY ANGIOGRAPHY (Bilateral) as a surgical intervention.  The patient's history has been reviewed, patient examined, no change in status, stable for surgery.  I have reviewed the patient's chart and labs.  Questions were answered to the patient's satisfaction.     Lorine Bears

## 2023-07-16 ENCOUNTER — Encounter: Payer: Self-pay | Admitting: Cardiovascular Disease

## 2023-07-18 ENCOUNTER — Telehealth: Payer: Self-pay | Admitting: Cardiology

## 2023-07-18 NOTE — Telephone Encounter (Signed)
 Patient is returning call.

## 2023-07-18 NOTE — Telephone Encounter (Signed)
Called patient, advised of message from NP.  °Patient verbalized understanding.  ° °

## 2023-07-18 NOTE — Telephone Encounter (Signed)
 Would be better if she is able to keep it.  But understand if she needs to reschedule due to the weather.  Top priority is her safety.  The echo will show heart function and valves with the heart catheterization and only give [place] a visual estimate of the function.

## 2023-07-18 NOTE — Telephone Encounter (Signed)
 Left a message for the patient to call back.

## 2023-07-18 NOTE — Telephone Encounter (Signed)
 Patient is calling because she had a heart cath procedure on 07/15/23 and has an echo scheduled for 07/19/23. Patient would like to know if she still needs to have the echo completed. Please advise.

## 2023-07-19 ENCOUNTER — Ambulatory Visit: Payer: HMO | Attending: Cardiology

## 2023-07-19 DIAGNOSIS — R079 Chest pain, unspecified: Secondary | ICD-10-CM | POA: Diagnosis not present

## 2023-07-19 DIAGNOSIS — R0602 Shortness of breath: Secondary | ICD-10-CM

## 2023-07-19 DIAGNOSIS — R0609 Other forms of dyspnea: Secondary | ICD-10-CM

## 2023-07-19 LAB — ECHOCARDIOGRAM COMPLETE
AR max vel: 2.4 cm2
AV Area VTI: 2.19 cm2
AV Area mean vel: 2.22 cm2
AV Mean grad: 2 mm[Hg]
AV Peak grad: 4.7 mm[Hg]
Ao pk vel: 1.08 m/s
Area-P 1/2: 4.83 cm2
Calc EF: 43.6 %
S' Lateral: 2.6 cm
Single Plane A2C EF: 48.8 %
Single Plane A4C EF: 38.7 %

## 2023-07-19 NOTE — Progress Notes (Signed)
 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  There were no valvular abnormalities that were noted.  Continue with current medication regimen without further changes at this time.  Will continue with further escalation of medications for heart failure on return.

## 2023-07-21 NOTE — Progress Notes (Unsigned)
 Date:  07/22/2023   ID:  Redith, Drach Nov 15, 1955, MRN [id number]  Patient Location:  [address] [place] [address]   Provider location:   [facility], [place] office  PCP:  Sherlene Shams, MD  Cardiologist:  Hubbard Robinson [facility]  Chief Complaint  Patient presents with   Follow up cardiac cath     Patient c/o weakness, fatigue,dizziness, shortness of breath & right arm pain since the cardiac cath.     History of Present Illness:    Cheryl [name] is a 68 y.o. female  past medical history of left bundle branch block,  41 year smoking history,  sleep apnea, has chronic fatigue, compliant with her CPAP Previous stress testing, cardiac catheterization at [facility] with reportedly no significant CAD at that time in 2003, additional episodes of palpitations and chest pain,  Prior episodes of chest pain,workup in June 2012 with stress testing/Myoview showing no ischemia  Moderate aortic atherosclerosis  Rare episodes of tachycardia, once per month. 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 History of chest pain Severe coronary calcification LAD and left circumflex Cardiac catheterization February 2025,  20% nonobstructive disease Echo with ejection fraction 40 to 45% who presents for follow-up of her palpitations, PAD, chest pain, shortness of breath  Last seen in clinic by myself November 2023 Seen by one of our providers February 2025 Reported having shortness of breath, chest pain Scheduled for cardiac PET stress, echo, ZIO monitor  MALS surgery in April 2024  Postprocedure ileus, long recovery Since then had shortness of breath and chest pain Leading to PET stress test (no ischemia), sent for cardiac catheterization showing nonobstructive disease,  echo EF 40 to 45%  Seen by vascular, scheduled for celiac artery stent placement for small postsurgical pseudoaneurysm Scheduled for March 12th  Reports she still is  having trouble eating Vomiting better Has diarrhea, has to stay near bathroom  Losing weight, eating crackers, missing meals Weight 152 on last visit end of 2023, now 132 Up 7 pounds from her low  Still smoking 3-4 cigs a day 1 carton every 4 weeks  Active, likes to cook Has 22 chickens  Reports dizziness/orthostasis symptoms, headache  Carotid u/s 11/2021 1-39% bilateral carotids 70 to 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   Prior history of herniated disk, trouble sitting,  EKG personally reviewed by myself on todays visit EKG Interpretation Date/Time:  Monday July 22 2023 08:47:10 EST Ventricular Rate:  87 PR Interval:  176 QRS Duration:  148 QT Interval:  394 QTC Calculation: 474 R Axis:   2  Text Interpretation: Normal sinus rhythm Left bundle branch block When compared with ECG of 11-Jul-2023 13:57, Questionable change in QRS axis T wave inversion no longer evident in Inferior leads Confirmed by Julien Nordmann [phone]) on 07/22/2023 8:54:32 AM    Other past medical hx reviewed U/s Mesenteric:  Normal Superior Mesenteric artery findings. 70 to 99% stenosis in the  celiac  artery.  Known dilatation of the splenic artery with a maximum diameter of 0.99cm,  distally.     No significant increase in the splenic artery when compared to the  previous exams. Mild increase in the celiac artery when compared to the exam on 11/03/18,  however, it does more closely correlate with studies on 09/09/17 and  08/13/16.    chest CT scan earlier in 2019 reviewed with her showing three-vessel coronary calcification, mild aortic arch atherosclerosis   Denies  any tachycardia, has not been taking propranolol No recent lipid panel, possibly one at work Thinks it is good but does not know the details Prior LDL 98 Does not really want a cholesterol medication   No regular exercise program, weight has been trending upwards    ultrasound  March 2018 by Dr. 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    Stress test 11/10/2010 showing no ischemia. 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   CARDIAC CATHETERIZATION  2006   [facility]   cystic fibrosis tumor removal  1983   RIGHT/LEFT HEART CATH AND CORONARY ANGIOGRAPHY Bilateral 07/15/2023   Procedure: RIGHT/LEFT HEART CATH AND CORONARY ANGIOGRAPHY;  Surgeon: Iran Ouch, MD;  Location: [facility] INVASIVE CV LAB;  Service: Cardiovascular;  Laterality: Bilateral;   THUMB AMPUTATION  1992   traumatic   TONSILLECTOMY AND ADENOIDECTOMY  1964   VISCERAL ANGIOGRAPHY N/A 06/29/2022   Procedure: VISCERAL ANGIOGRAPHY;  Surgeon: Renford Dills, MD;  Location: [facility] INVASIVE CV LAB;  Service: Cardiovascular;  Laterality: N/A;   VISCERAL ANGIOGRAPHY N/A 07/17/2022   Procedure: VISCERAL ANGIOGRAPHY;  Surgeon: Renford Dills, MD;  Location: [facility] INVASIVE CV LAB;  Service: Cardiovascular;  Laterality:  N/A;     Allergies:   Peanuts [peanut oil], Penicillins, Sulfa antibiotics, Corticosteroids, Influenza vac split [influenza virus vaccine], Mobic [meloxicam], Nickel, Nsaids, Penicillin g, and Prednisone   Social History   Tobacco Use   Smoking status: Every Day    Current packs/day: 0.25    Average packs/day: 0.3 packs/day for 44.3 years (11.1 ttl pk-yrs)    Types: Cigarettes    Start date: 04/11/1979   Smokeless tobacco: Never  Vaping Use   Vaping status: Never Used  Substance Use Topics   Alcohol use: Yes    Comment: occasional   Drug use: No     Current Outpatient Medications on File Prior to Visit  Medication Sig Dispense Refill   albuterol (PROAIR HFA) 108 (90 Base) MCG/ACT inhaler Inhale 1-2 puffs into the lungs every 6 (six) hours as needed for wheezing or shortness of breath. 8 g 2   amitriptyline (ELAVIL) 50 MG tablet TAKE 1 TABLET BY MOUTH AT BEDTIME 90 tablet 0   buPROPion (WELLBUTRIN) 100 MG tablet Take 1.5 tablets (150 mg total) by mouth 2 (two) times daily. 90 tablet 1   Calcium Carbonate-Vit D-Min (CALCIUM  1200 PO) Take 1,200 mg by mouth daily.     carvedilol (COREG) 6.25 MG tablet Take 1 tablet (6.25 mg total) by mouth 2 (two) times daily with a meal. 180 tablet 3   Cholecalciferol (VITAMIN D) 125 MCG (5000 UT) CAPS Take 5,000 Units by mouth daily.     folic acid (FOLVITE) 1 MG tablet Take 1 mg by mouth daily.     gabapentin (NEURONTIN) 300 MG capsule Take 1 capsule (300 mg total) by mouth 3 (three) times daily. 270 capsule 3   HYDROcodone-acetaminophen (NORCO/VICODIN) 5-325 MG tablet Take 1 tablet by mouth 2 (two) times daily as needed for moderate pain (pain score 4-6). (Patient taking differently: Take 1 tablet by mouth 2 (two) times daily.) 60 tablet 0   isosorbide mononitrate (IMDUR) 30 MG 24 hr tablet Take 1 tablet (30 mg total) by mouth daily. 90 tablet 1   nitroGLYCERIN (NITROSTAT) 0.4 MG SL tablet Place 1 tablet (0.4 mg total) under the tongue every 5  (five) minutes as needed for chest pain. DISSOLVE 1 TABLET UNDER THE TONGUE EVERY 5 MINUTES AS  NEEDED FOR CHEST PAIN. MAX  OF 3 TABLETS IN 15 MINUTES. CALL 911 IF PAIN PERSISTS. 100 tablet 3   omeprazole (PRILOSEC) 40 MG capsule Take 1 capsule (40 mg total) by mouth daily. 90 capsule 1   ondansetron (ZOFRAN) 8 MG tablet Take 1 tablet (8 mg total) by mouth every 8 (eight) hours as needed for nausea or vomiting. 60 tablet 1   Prasterone, DHEA, 10 MG CAPS Take 10 mg by mouth daily.     progesterone (PROMETRIUM) 200 MG capsule Take 200 mg by mouth at bedtime.     rOPINIRole (REQUIP) 1 MG tablet Take 1 tablet (1 mg total) by mouth 3 (three) times daily. 90 tablet 1   rosuvastatin (CRESTOR) 10 MG tablet Take 1 tablet (10 mg total) by mouth daily. 90 tablet 3   traMADol (ULTRAM) 50 MG tablet Take 1 tablet by mouth twice daily as needed for pain 60 tablet 2   venlafaxine XR (EFFEXOR-XR) 75 MG 24 hr capsule Take 75 mg by mouth daily.     vitamin C (ASCORBIC ACID) 500 MG tablet Take 500 mg by mouth daily.     No current facility-administered medications on file prior to visit.     Family Hx: The patient's family history includes Arthritis in her father and mother; Breast cancer in her cousin; Breast cancer (age of onset: 34) in her mother; Cancer in her mother; Diabetes in her brother, father, maternal aunt, maternal grandmother, paternal grandfather, paternal grandmother, and sister; Heart attack in her mother; Heart disease in her father, maternal grandfather, maternal grandmother, mother, paternal grandfather, paternal grandmother, and sister; Heart disease (age of onset: 74) in her brother; Hodgkin's lymphoma in her father; Hyperlipidemia in her mother; Hypertension in her father and mother; Liver disease in her brother; Lung disease in her brother; Parkinson's disease in her father; Stroke in her paternal grandfather.  ROS:   Please see the history of present illness.    Review of Systems   Constitutional:  Positive for weight loss.  HENT: Negative.    Respiratory: Negative.    Cardiovascular: Negative.   Gastrointestinal: Negative.   Musculoskeletal: Negative.   Neurological: Negative.   Psychiatric/Behavioral: Negative.    All other systems reviewed and are negative.    Labs/Other Tests and Data Reviewed:    Recent Labs: 11/12/2022: TSH 0.76 02/12/2023: Magnesium 1.9 06/24/2023: ALT 20 07/09/2023: BUN 8;  Creatinine, Ser 0.93; Platelets 322 07/15/2023: Hemoglobin 8.2; Potassium 4.0; Sodium 142   Recent Lipid Panel Lab Results  Component Value Date/Time   CHOL 89 11/12/2022 03:03 PM   CHOL 99 (L) 06/13/2018 08:29 AM   TRIG 83.0 11/12/2022 03:03 PM   HDL 31.60 (L) 11/12/2022 03:03 PM   HDL 34 (L) 06/13/2018 08:29 AM   CHOLHDL 3 11/12/2022 03:03 PM   LDLCALC 41 11/12/2022 03:03 PM   LDLCALC 38 06/13/2018 08:29 AM   LDLDIRECT 58.0 05/02/2022 04:08 PM    Wt Readings from Last 3 Encounters:  07/22/23 132 lb 4 oz (60 kg)  07/15/23 133 lb 9.6 oz (60.6 kg)  07/04/23 132 lb (59.9 kg)     Exam:    Vital Signs: Vital signs may also be detailed in the HPI BP 110/60 (BP Location: Left Arm, Patient Position: Sitting, Cuff Size: Normal)   Pulse 87   Ht 5\' 3"  (1.6 m)   Wt 132 lb 4 oz (60 kg)   SpO2 97%   BMI 23.43 kg/m   Constitutional:  oriented to person, place, and time. No distress.  HENT:  Head: Grossly normal Eyes:  no discharge. No scleral icterus.  Neck: No JVD, no carotid bruits  Cardiovascular: Regular rate and rhythm, no murmurs appreciated Pulmonary/Chest: Clear to auscultation bilaterally, no wheezes or rails Abdominal: Soft.  no distension.  no tenderness.  Musculoskeletal: Normal range of motion Neurological:  normal muscle tone. Coordination normal. No atrophy Skin: Skin warm and dry Psychiatric: normal affect, pleasant  ASSESSMENT & PLAN:    Problem List Items Addressed This Visit       Cardiology Problems   PAD (peripheral artery  disease) (HCC)   Relevant Orders   EKG 12-Lead (Completed)   Celiac artery stenosis (HCC)   Aortic atherosclerosis (HCC)   Relevant Orders   EKG 12-Lead (Completed)   Hyperlipidemia     Other   Chest pain - Primary   Relevant Orders   EKG 12-Lead (Completed)   Other Visit Diagnoses       DOE (dyspnea on exertion)       Relevant Orders   EKG 12-Lead (Completed)     SOB (shortness of breath)       Relevant Orders   EKG 12-Lead (Completed)     Bilateral carotid artery stenosis       Relevant Orders   EKG 12-Lead (Completed)     History of tobacco abuse         Palpitations       Relevant Orders   EKG 12-Lead (Completed)      Mixed hyperlipidemia Cholesterol is at goal on the current lipid regimen. No changes to the medications were made.  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[facility]) Chronic GI issues, plan for stent placement March 2025 at [facility]  Palpitations Symptoms stable on carvedilol  Headaches/orthostasis Recommend she cut her isosorbide down to 15 mg daily, take it in the evening  Cardiomyopathy Nonischemic given recent cardiac catheterization findings On carvedilol, will reduce isosorbide in half Given GI symptoms will hold off on additional medications at this time, could consider SGLT2 inhibitor and follow-up Titration of GDMT limited secondary to hypotension   Signed, Julien Nordmann, MD  [facility] [address], [place], [place] [address]

## 2023-07-22 ENCOUNTER — Encounter: Payer: Self-pay | Admitting: Cardiovascular Disease

## 2023-07-22 ENCOUNTER — Ambulatory Visit: Payer: HMO | Attending: Cardiovascular Disease | Admitting: Cardiovascular Disease

## 2023-07-22 VITALS — BP 110/60 | HR 87 | Ht 63.0 in | Wt 132.2 lb

## 2023-07-22 DIAGNOSIS — R0609 Other forms of dyspnea: Secondary | ICD-10-CM | POA: Diagnosis not present

## 2023-07-22 DIAGNOSIS — R0602 Shortness of breath: Secondary | ICD-10-CM | POA: Diagnosis not present

## 2023-07-22 DIAGNOSIS — R079 Chest pain, unspecified: Secondary | ICD-10-CM

## 2023-07-22 DIAGNOSIS — I739 Peripheral vascular disease, unspecified: Secondary | ICD-10-CM

## 2023-07-22 DIAGNOSIS — I700 Atherosclerosis of aorta: Secondary | ICD-10-CM

## 2023-07-22 DIAGNOSIS — E782 Mixed hyperlipidemia: Secondary | ICD-10-CM | POA: Diagnosis not present

## 2023-07-22 DIAGNOSIS — I6523 Occlusion and stenosis of bilateral carotid arteries: Secondary | ICD-10-CM | POA: Diagnosis not present

## 2023-07-22 DIAGNOSIS — I774 Celiac artery compression syndrome: Secondary | ICD-10-CM | POA: Diagnosis not present

## 2023-07-22 DIAGNOSIS — Z87891 Personal history of nicotine dependence: Secondary | ICD-10-CM

## 2023-07-22 DIAGNOSIS — R002 Palpitations: Secondary | ICD-10-CM | POA: Diagnosis not present

## 2023-07-22 MED ORDER — ISOSORBIDE MONONITRATE ER 30 MG PO TB24: 15.0000 mg | ORAL_TABLET | Freq: Every day | ORAL | 3 refills | Status: DC

## 2023-07-22 NOTE — Patient Instructions (Addendum)
 Medication Instructions:  Please decrease the isosorbide in 1/2 daily  If you need a refill on your cardiac medications before your next appointment, please call your pharmacy.   Lab work: No new labs needed  Testing/Procedures: No new testing needed  Follow-Up: At [facility], you and your health needs are our priority.  As part of our continuing mission to provide you with exceptional heart care, we have created designated Provider Care Teams.  These Care Teams include your primary Cardiologist (physician) and Advanced Practice Providers (APPs -  Physician Assistants and Nurse Practitioners) who all work together to provide you with the care you need, when you need it.  You will need a follow up appointment in 6 months  Providers on your designated Care Team:   Nicolasa Ducking, NP Eula Listen, PA-C Cadence Fransico Michael, [place]  COVID-19 Vaccine Information can be found at: [email or web address] For questions related to vaccine distribution or appointments, please email [email or web address] or call [phone].

## 2023-07-22 NOTE — Addendum Note (Signed)
 Addended by: Jani Gravel on: 07/22/2023 09:36 AM   Modules accepted: Orders

## 2023-08-07 DIAGNOSIS — I774 Celiac artery compression syndrome: Secondary | ICD-10-CM | POA: Diagnosis not present

## 2023-08-07 DIAGNOSIS — Z5309 Procedure and treatment not carried out because of other contraindication: Secondary | ICD-10-CM | POA: Diagnosis not present

## 2023-08-09 ENCOUNTER — Other Ambulatory Visit: Payer: Self-pay | Admitting: Internal Medicine

## 2023-08-09 NOTE — Telephone Encounter (Signed)
 Pt has appt with provider on 08-12-23 med pended for visit

## 2023-08-12 ENCOUNTER — Encounter: Payer: Self-pay | Admitting: Internal Medicine

## 2023-08-12 ENCOUNTER — Telehealth: Payer: Self-pay

## 2023-08-12 ENCOUNTER — Ambulatory Visit: Payer: PPO | Admitting: Internal Medicine

## 2023-08-12 VITALS — BP 118/72 | HR 70 | Temp 97.9°F | Ht 63.0 in | Wt 133.8 lb

## 2023-08-12 DIAGNOSIS — I129 Hypertensive chronic kidney disease with stage 1 through stage 4 chronic kidney disease, or unspecified chronic kidney disease: Secondary | ICD-10-CM | POA: Diagnosis not present

## 2023-08-12 DIAGNOSIS — R0609 Other forms of dyspnea: Secondary | ICD-10-CM | POA: Diagnosis not present

## 2023-08-12 DIAGNOSIS — Z1231 Encounter for screening mammogram for malignant neoplasm of breast: Secondary | ICD-10-CM

## 2023-08-12 DIAGNOSIS — F17200 Nicotine dependence, unspecified, uncomplicated: Secondary | ICD-10-CM

## 2023-08-12 DIAGNOSIS — Z720 Tobacco use: Secondary | ICD-10-CM

## 2023-08-12 DIAGNOSIS — E785 Hyperlipidemia, unspecified: Secondary | ICD-10-CM | POA: Diagnosis not present

## 2023-08-12 DIAGNOSIS — J432 Centrilobular emphysema: Secondary | ICD-10-CM

## 2023-08-12 MED ORDER — BUPROPION HCL 100 MG PO TABS: 150.0000 mg | ORAL_TABLET | Freq: Two times a day (BID) | ORAL | 1 refills | Status: DC

## 2023-08-12 MED ORDER — BREZTRI AEROSPHERE 160-9-4.8 MCG/ACT IN AERO: 2.0000 | INHALATION_SPRAY | Freq: Two times a day (BID) | RESPIRATORY_TRACT | Status: AC

## 2023-08-12 MED ORDER — FUROSEMIDE 20 MG PO TABS: 20.0000 mg | ORAL_TABLET | Freq: Every day | ORAL | 3 refills | Status: AC | PRN

## 2023-08-12 MED ORDER — HYDROCODONE-ACETAMINOPHEN 5-325 MG PO TABS: 1.0000 | ORAL_TABLET | Freq: Two times a day (BID) | ORAL | 0 refills | Status: DC | PRN

## 2023-08-12 MED ORDER — AIRSUPRA 90-80 MCG/ACT IN AERO: 1.0000 | INHALATION_SPRAY | Freq: Four times a day (QID) | RESPIRATORY_TRACT | 11 refills | Status: AC | PRN

## 2023-08-12 NOTE — Patient Instructions (Addendum)
 I am prescribing  14 day sample of Breztri for your emphysema .  1 puff every 12 hours.   If you tolerate the medication I will send refills to your pharmacy   Albuterol is also being "upgraded" to Air supra  as your rescue inhaler   I do not advise taking propranolol on top of carvedilol because the combination may be slowing your heart down

## 2023-08-12 NOTE — Progress Notes (Unsigned)
 Subjective:  Patient ID: Cheryl [name], female    DOB: August 05, 1955  Age: 68 y.o. MRN: [id number]  CC: There were no encounter diagnoses.   HPI Cheryl [name] presents for No chief complaint on file.  Stress:  Celiac artery stent  has been postponed  due to findings on the cardiac  cath .    Did not tolerate imdur due to severe headaches.  Tried 1/2 dose at bedtime , still having headaches . Using propranol for management of left sided chest pain not relieved by NTG  .  Still having daily episodes  of dizziness caused by position change, walking too far    Patient underwent diagnostic cardiac cath in Feb as part of preop for the colorectal surgery.  ECHO noted EF slightly better at 40 to 45%   Prox RCA lesion is 20% stenosed.   Prox LAD to Mid LAD lesion is 20% stenosed.   There is moderate left ventricular systolic dysfunction.   The left ventricular ejection fraction is 35-45% by visual estimate.   1.  Mildly calcified coronary arteries with mild nonobstructive coronary artery disease. 2.  Moderately reduced LV systolic function with global hypokinesis. 3.  Right heart catheterization showed normal right and left filling pressures, normal pulmonary pressure and normal cardiac output.   Recommendations: Recommend medical therapy for nonobstructive coronary artery disease and chronic systolic heart failure which seems to be due to nonischemic cardiomyopathy.     Outpatient Medications Prior to Visit  Medication Sig Dispense Refill   albuterol (PROAIR HFA) 108 (90 Base) MCG/ACT inhaler Inhale 1-2 puffs into the lungs every 6 (six) hours as needed for wheezing or shortness of breath. 8 g 2   amitriptyline (ELAVIL) 50 MG tablet TAKE 1 TABLET BY MOUTH AT BEDTIME 90 tablet 0   buPROPion (WELLBUTRIN) 100 MG tablet Take 1.5 tablets (150 mg total) by mouth 2 (two) times daily. 90 tablet 1   Calcium Carbonate-Vit D-Min (CALCIUM 1200 PO) Take 1,200 mg by mouth daily.      carvedilol (COREG) 6.25 MG tablet Take 1 tablet (6.25 mg total) by mouth 2 (two) times daily with a meal. 180 tablet 3   Cholecalciferol (VITAMIN D) 125 MCG (5000 UT) CAPS Take 5,000 Units by mouth daily.     folic acid (FOLVITE) 1 MG tablet Take 1 mg by mouth daily.     gabapentin (NEURONTIN) 300 MG capsule Take 1 capsule (300 mg total) by mouth 3 (three) times daily. 270 capsule 3   HYDROcodone-acetaminophen (NORCO/VICODIN) 5-325 MG tablet Take 1 tablet by mouth 2 (two) times daily as needed for moderate pain (pain score 4-6). (Patient taking differently: Take 1 tablet by mouth 2 (two) times daily.) 60 tablet 0   isosorbide mononitrate (IMDUR) 30 MG 24 hr tablet Take 0.5 tablets (15 mg total) by mouth daily. 45 tablet 3   nitroGLYCERIN (NITROSTAT) 0.4 MG SL tablet Place 1 tablet (0.4 mg total) under the tongue every 5 (five) minutes as needed for chest pain. DISSOLVE 1 TABLET UNDER THE TONGUE EVERY 5 MINUTES AS  NEEDED FOR CHEST PAIN. MAX  OF 3 TABLETS IN 15 MINUTES. CALL 911 IF PAIN PERSISTS. 100 tablet 3   omeprazole (PRILOSEC) 40 MG capsule Take 1 capsule (40 mg total) by mouth daily. 90 capsule 1   ondansetron (ZOFRAN) 8 MG tablet Take 1 tablet (8 mg total) by mouth every 8 (eight) hours as needed for nausea or vomiting. 60 tablet 1   Prasterone, DHEA, 10 MG  CAPS Take 10 mg by mouth daily.     progesterone (PROMETRIUM) 200 MG capsule Take 200 mg by mouth at bedtime.     rOPINIRole (REQUIP) 1 MG tablet Take 1 tablet (1 mg total) by mouth 3 (three) times daily. 90 tablet 1   rosuvastatin (CRESTOR) 10 MG tablet Take 1 tablet (10 mg total) by mouth daily. 90 tablet 3   traMADol (ULTRAM) 50 MG tablet Take 1 tablet by mouth twice daily as needed for pain 60 tablet 2   venlafaxine XR (EFFEXOR-XR) 75 MG 24 hr capsule Take 75 mg by mouth daily.     vitamin C (ASCORBIC ACID) 500 MG tablet Take 500 mg by mouth daily.     No facility-administered medications prior to visit.    Review of  Systems;  Patient denies headache, fevers, malaise, unintentional weight loss, skin rash, eye pain, sinus congestion and sinus pain, sore throat, dysphagia,  hemoptysis , cough, dyspnea, wheezing, chest pain, palpitations, orthopnea, edema, abdominal pain, nausea, melena, diarrhea, constipation, flank pain, dysuria, hematuria, urinary  Frequency, nocturia, numbness, tingling, seizures,  Focal weakness, Loss of consciousness,  Tremor, insomnia, depression, anxiety, and suicidal ideation.      Objective:  There were no vitals taken for this visit.  BP Readings from Last 3 Encounters:  07/22/23 110/60  07/15/23 130/67  07/04/23 (!) 100/50    Wt Readings from Last 3 Encounters:  07/22/23 132 lb 4 oz (60 kg)  07/15/23 133 lb 9.6 oz (60.6 kg)  07/04/23 132 lb (59.9 kg)    Physical Exam  Lab Results  Component Value Date   HGBA1C 5.5 05/02/2022   HGBA1C 5.1 10/26/2015   HGBA1C 5.4 11/25/2013    Lab Results  Component Value Date   CREATININE 0.93 07/09/2023   CREATININE 0.96 06/24/2023   CREATININE 1.18 (H) 04/23/2023    Lab Results  Component Value Date   WBC 7.8 07/09/2023   HGB 8.2 (L) 07/15/2023   HCT 24.0 (L) 07/15/2023   PLT 322 07/09/2023   GLUCOSE 109 (H) 07/09/2023   CHOL 89 11/12/2022   TRIG 83.0 11/12/2022   HDL 31.60 (L) 11/12/2022   LDLDIRECT 58.0 05/02/2022   LDLCALC 41 11/12/2022   ALT 20 06/24/2023   AST 18 06/24/2023   NA 142 07/15/2023   K 4.0 07/15/2023   CL 105 07/09/2023   CREATININE 0.93 07/09/2023   BUN 8 07/09/2023   CO2 23 07/09/2023   TSH 0.76 11/12/2022   INR 1.1 07/17/2022   HGBA1C 5.5 05/02/2022   MICROALBUR 1.1 05/02/2022    CARDIAC CATHETERIZATION Result Date: 07/15/2023   Prox RCA lesion is 20% stenosed.   Prox LAD to Mid LAD lesion is 20% stenosed.   There is moderate left ventricular systolic dysfunction.   The left ventricular ejection fraction is 35-45% by visual estimate. 1.  Mildly calcified coronary arteries with mild  nonobstructive coronary artery disease. 2.  Moderately reduced LV systolic function with global hypokinesis. 3.  Right heart catheterization showed normal right and left filling pressures, normal pulmonary pressure and normal cardiac output. Recommendations: Recommend medical therapy for nonobstructive coronary artery disease and chronic systolic heart failure which seems to be due to nonischemic cardiomyopathy.    Assessment & Plan:  .There are no diagnoses linked to this encounter.   I spent 34 minutes on the day of this face to face encounter reviewing patient's  most recent visit with cardiology,  nephrology,  and neurology,  prior relevant surgical and non surgical  procedures, recent  labs and imaging studies, counseling on weight management,  reviewing the assessment and plan with patient, and post visit ordering and reviewing of  diagnostics and therapeutics with patient  .   Follow-up: No follow-ups on file.   Sherlene Shams, MD

## 2023-08-12 NOTE — Telephone Encounter (Signed)
 Medication Samples have been provided to the patient.  Drug name: Breztri       Strength: 160 mcg/ 9 mcg/ 4.8 mcg        Qty: 1 box  LOT: [id number]  Exp.Date: 02/24/2026  The patient has been instructed regarding the correct time, dose, and frequency of taking this medication, including desired effects and most common side effects.   Cheryl [name] 3:59 PM 08/12/2023

## 2023-08-13 ENCOUNTER — Telehealth: Payer: Self-pay | Admitting: Internal Medicine

## 2023-08-13 NOTE — Assessment & Plan Note (Signed)
 Concerned that this is an anginal equivalent.  Ambulatory sats did not drop in office with 2 minute walk  and pulse remained in the high 80's.  She is not orthostatic.  She has non critical CAD, PAD and ongoing tobacco abuse. She does not tolerate Imdur and carvedilol,  refilled expired Sl ntg.  Chest x rAY ORDERED AND PULMONOLOGY REFERRAL MADE

## 2023-08-13 NOTE — Telephone Encounter (Signed)
 Please ask her to get a chest x ray at [facility] on [address].  She has not had one in 5 years.

## 2023-08-13 NOTE — Assessment & Plan Note (Signed)
 She  has reduced her cigarette use from 1-2 packs daily to 2-3 daily.

## 2023-08-15 ENCOUNTER — Other Ambulatory Visit: Payer: Self-pay | Admitting: Family Medicine

## 2023-08-15 NOTE — Telephone Encounter (Signed)
 Pt is aware and gave a verbal understanding.

## 2023-08-16 ENCOUNTER — Other Ambulatory Visit: Payer: Self-pay

## 2023-08-16 MED ORDER — GABAPENTIN 300 MG PO CAPS: 300.0000 mg | ORAL_CAPSULE | Freq: Three times a day (TID) | ORAL | 0 refills | Status: DC

## 2023-08-19 ENCOUNTER — Ambulatory Visit: Payer: HMO | Admitting: Cardiology

## 2023-08-20 ENCOUNTER — Ambulatory Visit: Admitting: Pulmonary Disease

## 2023-08-20 ENCOUNTER — Encounter: Payer: Self-pay | Admitting: Pulmonary Disease

## 2023-08-20 VITALS — BP 130/64 | HR 78 | Temp 97.6°F | Ht 63.0 in | Wt 132.2 lb

## 2023-08-20 DIAGNOSIS — R0602 Shortness of breath: Secondary | ICD-10-CM | POA: Diagnosis not present

## 2023-08-20 DIAGNOSIS — I5022 Chronic systolic (congestive) heart failure: Secondary | ICD-10-CM

## 2023-08-20 DIAGNOSIS — J449 Chronic obstructive pulmonary disease, unspecified: Secondary | ICD-10-CM

## 2023-08-20 DIAGNOSIS — I428 Other cardiomyopathies: Secondary | ICD-10-CM | POA: Insufficient documentation

## 2023-08-20 DIAGNOSIS — I774 Celiac artery compression syndrome: Secondary | ICD-10-CM

## 2023-08-20 DIAGNOSIS — F17210 Nicotine dependence, cigarettes, uncomplicated: Secondary | ICD-10-CM

## 2023-08-20 NOTE — Patient Instructions (Signed)
 VISIT SUMMARY:  Cheryl [name], a 68 year old woman with a history of left bundle branch block and a weak heart, visited [place] today due to shortness of breath that has been ongoing for a year. She experiences this primarily during physical activities, which also causes dizziness. 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 She has not had any prior pulmonary evaluations.  YOUR PLAN:  -SHORTNESS OF BREATH: Shortness of breath, or difficulty breathing, can be caused by various issues including heart and lung problems. We will conduct pulmonary function tests to check your lung function. Continue using your Breztri inhaler as prescribed, and remember to rinse your mouth with water and baking soda after each use. We also reviewed the proper inhaler technique and will monitor for any side effects.  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 You have a recurrent aneurysm in this area. To manage your symptoms, try to pace your activities.  -TOBACCO USE DISORDER: Tobacco use disorder is a condition where a person is dependent on tobacco. You have reduced your smoking significantly and are using behavioral strategies to quit. We discussed using the FUM (tryfum.com) inhaler as a non-nicotine alternative and provided resources for smoking cessation support. Continue reducing your smoking, avoid smoking indoors, and consider joining the 'Become an Ex'(becomeanex.org) program for additional support.  -LUNG CANCER SCREENING: Given your significant smoking history, you are eligible for lung cancer screening. We will enroll you in a lung cancer screening program to monitor your lung health.  INSTRUCTIONS:  Please schedule a follow-up appointment in 3 months to assess your response to  treatment and further evaluate your lung function. Ensure that you complete the pulmonary function tests before your follow-up appointment.

## 2023-08-20 NOTE — Progress Notes (Signed)
 Subjective:    Patient ID: Cheryl [name], female    DOB: [birth year], 68 y.o.   MRN: [id number]  Patient Care Team: Sherlene Shams, MD as PCP - General (Internal Medicine) Antonieta Iba, MD as PCP - Cardiology (Cardiology) Lemar Livings Merrily Pew, MD as Consulting Physician (General Surgery) Earna Coder, MD as Consulting Physician (Oncology)  Chief Complaint  Patient presents with   Consult    Shortness of breath and dizziness on exertion x 1 year.     BACKGROUND: Patient is a 68 year old smoker with a 44-pack-year history of smoking presents for evaluation of shortness of breath and dizziness on exertion for approximately a year.  She is kindly referred by Dr. Duncan Dull.   HPI Discussed the use of AI scribe software for clinical note transcription with the patient, who gave verbal consent to proceed.  History of Present Illness   Cheryl [name] "[name]" is a 68 year old female with left bundle branch block and nonischemic cardiomyopathy who presents with shortness of breath. She was referred by Dr. Duncan Dull for evaluation of her shortness of breath.  She has been experiencing shortness of breath since April of the previous year, primarily during physical activities, which leads to dizziness and the need to rest. No increase in shortness of breath when lying down and no wheezing. She has not undergone any prior pulmonary evaluations or seen a pulmonologist before.  Her cardiac history includes nonischemic cardiomyopathy with LVEF around 40% and left bundle branch block identified in previous cardiac evaluations. She associates her dizziness with her shortness of breath.  She has a significant smoking history, having smoked a pack a day since age 54, now reduced to one to three cigarettes a day since last year.  She was prescribed Breztri inhaler, two puffs twice a day, by Dr. Darrick Huntsman she has not started using it yet.  She underwent surgery last year for Median  Arcuate Ligament Syndrome and reports a recurrent aneurysm in the same location, obstructing the celiac artery, which she suspects may contribute to her symptoms.  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 She denies asbestos exposure but mentions inhaling dust in the sewing factory. She enjoys gardening and has 22 chickens.  Usually lived in [place].     DATA 07/15/2023 right and left heart cath: With mild nonobstructive coronary artery disease, moderately reduced LV systolic function with global hypokinesis, right heart catheterization showed normal right and left filling pressures, normal pulmonary pressure and normal cardiac output.  At with nonischemic cardiomyopathy. 07/19/2023 echocardiogram: 40 to 45% atrophy, grade 1 diastolic dysfunction, mild hypokinesis of the left ventricular and entire anteroseptal wall.  RV systolic function is normal, right ventricular size is normal, no valvular abnormalities  Review of Systems A 10 point review of systems was performed and it is as noted above otherwise negative.   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   Arrhythmia    left bundle branch block   Arthritis    Blood in stool    Chicken pox    Generalized headaches    History of blood transfusion    Kidney disease, chronic, stage III (GFR 30-59 ml/min) (HCC)    LBBB (left bundle branch block)    Renal disorder  Right lateral epicondylitis 08/13/2017   Thyroid disease    UTI (lower urinary tract infection)     Past Surgical History:  Procedure Laterality Date   ABDOMINAL HYSTERECTOMY  1997   APPENDECTOMY  1981   BREAST EXCISIONAL BIOPSY Bilateral 1976   neg   BREAST SURGERY  1976   CARDIAC CATHETERIZATION  2004   [facility]    CARDIAC CATHETERIZATION  2006   [facility]   cystic fibrosis tumor removal  1983   RIGHT/LEFT HEART CATH AND CORONARY ANGIOGRAPHY Bilateral 07/15/2023   Procedure: RIGHT/LEFT HEART CATH AND CORONARY ANGIOGRAPHY;  Surgeon: Iran Ouch, MD;  Location: [facility] INVASIVE CV LAB;  Service: Cardiovascular;  Laterality: Bilateral;   THUMB AMPUTATION  1992   traumatic   TONSILLECTOMY AND ADENOIDECTOMY  1964   VISCERAL ANGIOGRAPHY N/A 06/29/2022   Procedure: VISCERAL ANGIOGRAPHY;  Surgeon: Renford Dills, MD;  Location: [facility] INVASIVE CV LAB;  Service: Cardiovascular;  Laterality: N/A;   VISCERAL ANGIOGRAPHY N/A 07/17/2022   Procedure: VISCERAL ANGIOGRAPHY;  Surgeon: Renford Dills, MD;  Location: [facility] INVASIVE CV LAB;  Service: Cardiovascular;  Laterality: N/A;    Patient Active Problem List   Diagnosis Date Noted   Nonischemic cardiomyopathy (HCC) 08/20/2023   Abnormal stress test 07/15/2023   Chronic systolic heart failure (HCC) 07/15/2023   Epigastric pain 05/15/2023   B12 deficiency 05/15/2023   Dyspnea on exertion 05/08/2023   Anal warts 04/02/2023   Abdominal pain, chronic, generalized 03/30/2023   Small intestinal bacterial overgrowth (SIBO) 03/30/2023   Median arcuate ligament syndrome (HCC) 08/01/2022   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   Helicobacter pylori gastritis 05/03/2022   Benign positional vertigo 10/31/2021   Lateral epicondylitis, left elbow 10/17/2021   Emphysema of lung (HCC) 02/04/2021   Right shoulder pain 11/21/2020   Aortic atherosclerosis (HCC) 10/27/2020   Lumbar stenosis without neurogenic claudication 03/24/2020   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   Hospital discharge follow-up 02/15/2019   Chest pain 01/29/2019   Restless legs 11/09/2018   Educated about COVID-19 virus infection 11/09/2018   Osteoarthritis of both shoulders 08/03/2018   Cervical radiculopathy 12/30/2017   Nonallopathic lesion of cervical region 08/13/2017    Nonallopathic lesion of thoracic region 08/13/2017   Nonallopathic lesion of lumbosacral region 08/13/2017   Chronic renal insufficiency 04/03/2017   Renal hypertension 12/16/2016   Diverticulosis of colon 09/02/2016   PAD (peripheral artery disease) (HCC) 08/22/2016   Iron deficiency anemia 11/25/2015   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   Chronic neck pain 06/21/2015   Arthralgia 05/19/2015   Pancreatic cyst 10/24/2014   Multiple lung nodules 10/24/2014   Hyperlipidemia 10/20/2014   Degenerative lumbar spinal stenosis 09/18/2013   Screening for osteoporosis 08/20/2013   Routine general medical examination at a health care facility 10/26/2012   Anxiety 08/22/2012   Irritable bowel syndrome 08/17/2012   Tobacco abuse 08/17/2012   Hearing loss d/t noise 08/17/2012   Diverticulosis 08/17/2012   OSA (obstructive sleep apnea) 08/17/2012   Hematuria, microscopic 08/16/2012   Thalassemia minor 08/16/2012    Family History  Problem Relation Age of Onset   Heart disease Mother    Arthritis Mother    Cancer Mother        breast   Hyperlipidemia Mother    Hypertension Mother    Heart attack Mother    Breast cancer Mother 49   Heart disease Father    Diabetes Father    Arthritis Father    Hypertension Father  Parkinson's disease Father    Hodgkin's lymphoma Father        hodgkins disease, prostate   Heart disease Sister    Diabetes Sister    Diabetes Maternal Aunt    Heart disease Maternal Grandmother    Diabetes Maternal Grandmother    Heart disease Maternal Grandfather    Heart disease Paternal Grandmother    Diabetes Paternal Grandmother    Stroke Paternal Grandfather    Heart disease Paternal Grandfather    Diabetes Paternal Grandfather    Breast cancer Cousin        2 pat cousins   Heart disease Brother 45       ami x 8,  4 vessel CABG    Diabetes Brother    Liver disease Brother    Lung disease Brother     Social History   Tobacco Use   Smoking  status: Every Day    Current packs/day: 1.00    Average packs/day: 1 pack/day for 44.4 years (44.4 ttl pk-yrs)    Types: Cigarettes    Start date: 04/11/1979   Smokeless tobacco: Never   Tobacco comments:    1-3 cigarettes daily since 03/2023.  Substance Use Topics   Alcohol use: Yes    Comment: occasional    Allergies  Allergen Reactions   Peanuts [Peanut Oil]    Penicillins Other (See Comments)    Hives,rash,nausea,swelling,    Sulfa Antibiotics Other (See Comments)    Hives,rash,nausea,swelling   Corticosteroids     Other Reaction(s): RASH, SWELLING   Influenza Vac Split [Influenza Virus Vaccine]     Pleurisy  1996   Mobic [Meloxicam] Nausea Only    Renal insufficiency   Nickel    Nsaids Other (See Comments)    Decreased gfr    Penicillin G     Other Reaction(s): RASH , SWELLING   Prednisone Rash    Current Meds  Medication Sig   Albuterol-Budesonide (AIRSUPRA) 90-80 MCG/ACT AERO Inhale 1 puff into the lungs every 6 (six) hours as needed.   amitriptyline (ELAVIL) 50 MG tablet TAKE 1 TABLET BY MOUTH AT BEDTIME   budeson-glycopyrrolate-formoterol (BREZTRI AEROSPHERE) 160-9-4.8 MCG/ACT AERO Inhale 2 puffs into the lungs in the morning and at bedtime for 14 days.   buPROPion (WELLBUTRIN) 100 MG tablet Take 1.5 tablets (150 mg total) by mouth 2 (two) times daily.   Calcium Carbonate-Vit D-Min (CALCIUM 1200 PO) Take 1,200 mg by mouth daily.   carvedilol (COREG) 6.25 MG tablet Take 1 tablet (6.25 mg total) by mouth 2 (two) times daily with a meal.   Cholecalciferol (VITAMIN D) 125 MCG (5000 UT) CAPS Take 5,000 Units by mouth daily.   folic acid (FOLVITE) 1 MG tablet Take 1 mg by mouth daily.   furosemide (LASIX) 20 MG tablet Take 1 tablet (20 mg total) by mouth daily as needed.   gabapentin (NEURONTIN) 300 MG capsule Take 1 capsule (300 mg total) by mouth 3 (three) times daily.   HYDROcodone-acetaminophen (NORCO/VICODIN) 5-325 MG tablet Take 1 tablet by mouth 2 (two) times  daily as needed for moderate pain (pain score 4-6).   nitroGLYCERIN (NITROSTAT) 0.4 MG SL tablet Place 1 tablet (0.4 mg total) under the tongue every 5 (five) minutes as needed for chest pain. DISSOLVE 1 TABLET UNDER THE TONGUE EVERY 5 MINUTES AS  NEEDED FOR CHEST PAIN. MAX  OF 3 TABLETS IN 15 MINUTES. CALL 911 IF PAIN PERSISTS.   omeprazole (PRILOSEC) 40 MG capsule Take 1 capsule (40 mg total)  by mouth daily.   ondansetron (ZOFRAN) 8 MG tablet Take 1 tablet (8 mg total) by mouth every 8 (eight) hours as needed for nausea or vomiting.   Prasterone, DHEA, 10 MG CAPS Take 10 mg by mouth daily.   progesterone (PROMETRIUM) 200 MG capsule Take 200 mg by mouth at bedtime.   rOPINIRole (REQUIP) 1 MG tablet Take 1 tablet (1 mg total) by mouth 3 (three) times daily.   rosuvastatin (CRESTOR) 10 MG tablet Take 1 tablet (10 mg total) by mouth daily.   traMADol (ULTRAM) 50 MG tablet Take 1 tablet by mouth twice daily as needed for pain   venlafaxine XR (EFFEXOR-XR) 75 MG 24 hr capsule Take 75 mg by mouth daily.   vitamin C (ASCORBIC ACID) 500 MG tablet Take 500 mg by mouth daily.    Immunization History  Administered Date(s) Administered   Pneumococcal Polysaccharide-23 07/27/2014   Tdap 03/19/2011, 07/26/2020        Objective:     BP 130/64 (BP Location: Left Arm, Patient Position: Sitting, Cuff Size: Normal)   Pulse 78   Temp 97.6 F (36.4 C) (Temporal)   Ht 5\' 3"  (1.6 m)   Wt 132 lb 3.2 oz (60 kg)   SpO2 97%   BMI 23.42 kg/m   SpO2: 97 %  GENERAL: Well-developed, well-nourished woman, no acute distress.  Fully ambulatory, no conversational dyspnea. HEAD: Normocephalic, atraumatic.  EYES: Pupils equal, round, reactive to light.  No scleral icterus.  MOUTH: Dentition intact, some chipped teeth, oral mucosa moist, no thrush. NECK: Supple. No thyromegaly. Trachea midline. No JVD.  No adenopathy. PULMONARY: Good air entry bilaterally.  No adventitious sounds. CARDIOVASCULAR: S1 and S2.  Regular rate and rhythm.  No rubs, murmurs or gallops heard. ABDOMEN: Benign. MUSCULOSKELETAL: No joint deformity, no clubbing, no edema.  NEUROLOGIC: No overt focal deficit, no gait disturbance, speech is fluent. SKIN: Intact,warm,dry. PSYCH: Mood and behavior normal.  Ambulatory oxymetry was performed today:  At rest on room air oxygen saturation was 100%, the patient ambulated at a fast pace, completed 3 laps, O2 nadir 99%, no shortness of breath.  Resting heart rate was 80 bpm at maximum for this exercise 101 bpm.    Assessment & Plan:     ICD-10-CM   1. Shortness of breath  R06.02 Pulmonary Function Test [facility] Only    2. COPD suggested by initial evaluation [facility])  J44.9 Pulmonary Function Test [facility] Only    3. Median arcuate ligament syndrome (HCC)  I77.4     4. Nonischemic cardiomyopathy (HCC)  I42.8     5. Chronic systolic heart failure (HCC)  Z61.09     6. Tobacco dependence due to cigarettes  F17.210 Ambulatory Referral for Lung Cancer Scre      Orders Placed This Encounter  Procedures   Ambulatory Referral for Lung Cancer Scre    Referral Priority:   Routine    Referral Type:   Consultation    Referral Reason:   Specialty Services Required    Number of Visits Requested:   1   Pulmonary Function Test [facility] Only    Standing Status:   Future    Expected Date:   09/20/2023    Expiration Date:   08/19/2024    Full PFT: includes the following: basic spirometry, spirometry pre & post bronchodilator, diffusion capacity (DLCO), lung volumes:   Full PFT    This test can only be performed at:   [facility]   Assessment and Plan  Shortness of breath Chronic dyspnea for one year, exacerbated by physical activity. Oxygen saturation remains high during exertion, suggesting adequate lung function. Differential includes cardiac issues due to known cardiomyopathy and left bundle branch block, and potential pulmonary issues. 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 Recurrent aneurysm at the celiac artery, potentially contributing to dizziness and vascular issues. - Advise pacing activities to manage symptoms  Tobacco use disorder Long history of smoking, currently reduced to 1-3 cigarettes per day. She expresses enjoyment of smoking but is making efforts to quit using behavioral strategies such as lollipops and a non-nicotine inhaler. Discussed the use of FUM inhaler as a non-nicotine alternative and provided resources for smoking cessation support. - Encourage continued reduction in smoking - Provide information on Fum inhaler as a non-nicotine alternative - Refer to 'Become an Ex' Best boy) program for additional support in smoking cessation - Encourage avoidance of smoking indoors  Lung cancer screening Significant smoking history makes her eligible for lung cancer screening. - Enroll in lung cancer screening program  Follow-up Follow-up to assess response to treatment and further evaluate lung function. - Schedule follow-up appointment in 3 months - Ensure pulmonary function tests are completed before follow-up      Advised if symptoms do not improve or worsen, to please contact office for sooner follow up or seek emergency care.    I spent 60 minutes of dedicated to the care of this patient on the date of this encounter to include pre-visit review of records, face-to-face time with the patient discussing conditions above, post visit ordering of testing, clinical documentation with the electronic health record, making appropriate referrals as documented, and communicating necessary findings to members of the patients care team.   C. Danice Goltz, MD Advanced Bronchoscopy PCCM [facility]    *This note was dictated using voice recognition software/Dragon.  Despite best efforts to proofread, errors can occur which can change the meaning. Any transcriptional errors that result from this process are unintentional and may not be fully corrected at the time of dictation.

## 2023-08-22 DIAGNOSIS — I2510 Atherosclerotic heart disease of native coronary artery without angina pectoris: Secondary | ICD-10-CM | POA: Diagnosis not present

## 2023-08-22 DIAGNOSIS — R9431 Abnormal electrocardiogram [ECG] [EKG]: Secondary | ICD-10-CM | POA: Diagnosis not present

## 2023-08-22 DIAGNOSIS — I428 Other cardiomyopathies: Secondary | ICD-10-CM | POA: Diagnosis not present

## 2023-08-22 DIAGNOSIS — J449 Chronic obstructive pulmonary disease, unspecified: Secondary | ICD-10-CM | POA: Diagnosis not present

## 2023-08-22 DIAGNOSIS — N189 Chronic kidney disease, unspecified: Secondary | ICD-10-CM | POA: Diagnosis not present

## 2023-08-22 DIAGNOSIS — I447 Left bundle-branch block, unspecified: Secondary | ICD-10-CM | POA: Diagnosis not present

## 2023-08-22 DIAGNOSIS — I5040 Unspecified combined systolic (congestive) and diastolic (congestive) heart failure: Secondary | ICD-10-CM | POA: Diagnosis not present

## 2023-08-22 DIAGNOSIS — R0609 Other forms of dyspnea: Secondary | ICD-10-CM | POA: Diagnosis not present

## 2023-08-22 DIAGNOSIS — E785 Hyperlipidemia, unspecified: Secondary | ICD-10-CM | POA: Diagnosis not present

## 2023-08-22 DIAGNOSIS — R9430 Abnormal result of cardiovascular function study, unspecified: Secondary | ICD-10-CM | POA: Diagnosis not present

## 2023-08-22 DIAGNOSIS — I6522 Occlusion and stenosis of left carotid artery: Secondary | ICD-10-CM | POA: Diagnosis not present

## 2023-08-22 DIAGNOSIS — I771 Stricture of artery: Secondary | ICD-10-CM | POA: Diagnosis not present

## 2023-08-22 DIAGNOSIS — D563 Thalassemia minor: Secondary | ICD-10-CM | POA: Diagnosis not present

## 2023-08-22 DIAGNOSIS — I130 Hypertensive heart and chronic kidney disease with heart failure and stage 1 through stage 4 chronic kidney disease, or unspecified chronic kidney disease: Secondary | ICD-10-CM | POA: Diagnosis not present

## 2023-08-22 DIAGNOSIS — F17210 Nicotine dependence, cigarettes, uncomplicated: Secondary | ICD-10-CM | POA: Diagnosis not present

## 2023-08-22 DIAGNOSIS — R079 Chest pain, unspecified: Secondary | ICD-10-CM | POA: Diagnosis not present

## 2023-08-25 DIAGNOSIS — R079 Chest pain, unspecified: Secondary | ICD-10-CM | POA: Diagnosis not present

## 2023-08-25 DIAGNOSIS — R9430 Abnormal result of cardiovascular function study, unspecified: Secondary | ICD-10-CM | POA: Diagnosis not present

## 2023-08-25 DIAGNOSIS — R0609 Other forms of dyspnea: Secondary | ICD-10-CM | POA: Diagnosis not present

## 2023-09-12 ENCOUNTER — Other Ambulatory Visit: Payer: Self-pay | Admitting: Internal Medicine

## 2023-09-12 ENCOUNTER — Encounter: Payer: Self-pay | Admitting: Internal Medicine

## 2023-09-16 ENCOUNTER — Encounter: Payer: Self-pay | Admitting: Internal Medicine

## 2023-09-16 MED ORDER — GABAPENTIN 300 MG PO CAPS: 300.0000 mg | ORAL_CAPSULE | Freq: Three times a day (TID) | ORAL | 1 refills | Status: DC

## 2023-09-16 MED ORDER — HYDROCODONE-ACETAMINOPHEN 5-325 MG PO TABS
1.0000 | ORAL_TABLET | Freq: Two times a day (BID) | ORAL | 0 refills | Status: AC | PRN
Start: 2023-09-16 — End: ?

## 2023-09-18 DIAGNOSIS — N951 Menopausal and female climacteric states: Secondary | ICD-10-CM | POA: Diagnosis not present

## 2023-09-18 MED ORDER — FOLIC ACID 1 MG PO TABS: 1.0000 mg | ORAL_TABLET | Freq: Every day | ORAL | 3 refills | Status: DC

## 2023-09-18 NOTE — Addendum Note (Signed)
 Addended by: Thersia Flax on: 09/18/2023 07:35 AM   Modules accepted: Orders

## 2023-09-23 DIAGNOSIS — R5383 Other fatigue: Secondary | ICD-10-CM | POA: Diagnosis not present

## 2023-09-23 DIAGNOSIS — Z79890 Hormone replacement therapy (postmenopausal): Secondary | ICD-10-CM | POA: Diagnosis not present

## 2023-09-23 DIAGNOSIS — M2550 Pain in unspecified joint: Secondary | ICD-10-CM | POA: Diagnosis not present

## 2023-09-23 DIAGNOSIS — N951 Menopausal and female climacteric states: Secondary | ICD-10-CM | POA: Diagnosis not present

## 2023-09-23 DIAGNOSIS — R232 Flushing: Secondary | ICD-10-CM | POA: Diagnosis not present

## 2023-09-23 DIAGNOSIS — Z6823 Body mass index (BMI) 23.0-23.9, adult: Secondary | ICD-10-CM | POA: Diagnosis not present

## 2023-10-11 DIAGNOSIS — R109 Unspecified abdominal pain: Secondary | ICD-10-CM | POA: Diagnosis not present

## 2023-10-11 DIAGNOSIS — R1110 Vomiting, unspecified: Secondary | ICD-10-CM | POA: Diagnosis not present

## 2023-10-11 DIAGNOSIS — I774 Celiac artery compression syndrome: Secondary | ICD-10-CM | POA: Diagnosis not present

## 2023-10-15 ENCOUNTER — Encounter (INDEPENDENT_AMBULATORY_CARE_PROVIDER_SITE_OTHER): Payer: Self-pay

## 2023-10-15 ENCOUNTER — Ambulatory Visit (INDEPENDENT_AMBULATORY_CARE_PROVIDER_SITE_OTHER): Payer: PPO | Admitting: *Deleted

## 2023-10-15 VITALS — Ht 63.0 in | Wt 130.0 lb

## 2023-10-15 DIAGNOSIS — Z1159 Encounter for screening for other viral diseases: Secondary | ICD-10-CM | POA: Diagnosis not present

## 2023-10-15 DIAGNOSIS — Z0000 Encounter for general adult medical examination without abnormal findings: Secondary | ICD-10-CM

## 2023-10-15 NOTE — Progress Notes (Signed)
 Subjective:   Cheryl [name] is a 68 y.o. who presents for a Medicare Wellness preventive visit.  As a reminder, Annual Wellness Visits don't include a physical exam, and some assessments may be limited, especially if this visit is performed virtually. We may recommend an in-person follow-up visit with your provider if needed.  Visit Complete: Virtual I connected with  Cheryl [name] on 10/15/23 by a audio enabled telemedicine application and verified that I am speaking with the correct person using two identifiers.  Patient Location: Home  Provider Location: Home Office  I discussed the limitations of evaluation and management by telemedicine. The patient expressed understanding and agreed to proceed.  Vital Signs: Because this visit was a virtual/telehealth visit, some criteria may be missing or patient reported. Any vitals not documented were not able to be obtained and vitals that have been documented are patient reported.  VideoDeclined- This patient declined Librarian, academic. Therefore the visit was completed with audio only.  Persons Participating in Visit: Patient.  AWV Questionnaire: No: Patient Medicare AWV questionnaire was not completed prior to this visit.  Cardiac Risk Factors include: advanced age (>44men, >70 women);smoking/ tobacco exposure;dyslipidemia;hypertension     Objective:     Today's Vitals   10/15/23 1017 10/15/23 1018  Weight: 130 lb (59 kg)   Height: 5\' 3"  (1.6 m)   PainSc:  6    Body mass index is 23.03 kg/m.     10/15/2023   10:35 AM 06/24/2023    2:50 PM 06/24/2023    2:29 PM 04/23/2023    2:07 PM 03/22/2023   11:17 AM 03/01/2023   11:48 AM 02/20/2023    8:43 AM  Advanced Directives  Does Patient Have a Medical Advance Directive? Yes Yes Yes Yes Yes Yes Yes  Type of Estate agent of [place];Living will Healthcare Power of [place];Living will Healthcare Power of [employer] of [place];Living will Healthcare Power of [place];Living will Healthcare Power of [place];Living will  Does patient want to make changes to medical advance directive?  Yes (ED - Information included in AVS)    No - Patient declined   Copy of Healthcare Power of Attorney in Chart? No - copy requested  Yes - validated most recent copy scanned in chart (See row information)  Yes - validated most recent copy scanned in chart (See row information) Yes - validated most recent copy scanned in chart (See row information)     Current Medications (verified) Outpatient Encounter Medications as of 10/15/2023  Medication Sig   Albuterol -Budesonide (AIRSUPRA ) 90-80 MCG/ACT AERO Inhale 1 puff into the lungs every 6 (six) hours as needed.   amitriptyline  (ELAVIL ) 50 MG tablet TAKE 1 TABLET BY MOUTH AT BEDTIME   buPROPion  (WELLBUTRIN ) 100 MG tablet Take 1.5 tablets (150 mg total) by mouth 2 (two) times daily.   Calcium  Carbonate-Vit D-Min (CALCIUM  1200 PO) Take 1,200 mg by mouth daily.   carvedilol  (COREG ) 6.25 MG tablet Take 1 tablet (6.25 mg total) by mouth 2 (two) times daily with a meal.   Cholecalciferol (VITAMIN D ) 125 MCG (5000 UT) CAPS Take 5,000 Units by mouth daily.   folic acid  (FOLVITE ) 1 MG tablet Take 1 tablet (1 mg total) by mouth daily.   furosemide  (LASIX ) 20 MG tablet Take 1 tablet (20 mg total) by mouth daily as needed.   gabapentin  (NEURONTIN ) 300 MG capsule Take 1 capsule (300 mg total) by mouth 3 (three) times daily.   HYDROcodone -acetaminophen  (  NORCO/VICODIN) 5-325 MG tablet Take 1 tablet by mouth 2 (two) times daily as needed for moderate pain (pain score 4-6).   nitroGLYCERIN  (NITROSTAT ) 0.4 MG SL tablet Place 1 tablet (0.4 mg total) under the tongue every 5 (five) minutes as needed for chest pain. DISSOLVE 1 TABLET UNDER THE TONGUE EVERY 5 MINUTES AS  NEEDED FOR CHEST PAIN. MAX  OF 3 TABLETS IN 15 MINUTES. CALL 911 IF PAIN PERSISTS.   omeprazole  (PRILOSEC) 40 MG  capsule Take 1 capsule (40 mg total) by mouth daily.   ondansetron  (ZOFRAN ) 8 MG tablet Take 1 tablet (8 mg total) by mouth every 8 (eight) hours as needed for nausea or vomiting.   Prasterone, DHEA, 10 MG CAPS Take 10 mg by mouth daily.   progesterone  (PROMETRIUM ) 200 MG capsule Take 200 mg by mouth at bedtime.   rOPINIRole  (REQUIP ) 1 MG tablet TAKE 1 TABLET BY MOUTH THREE TIMES DAILY   rosuvastatin  (CRESTOR ) 10 MG tablet Take 1 tablet (10 mg total) by mouth daily.   traMADol  (ULTRAM ) 50 MG tablet Take 1 tablet by mouth twice daily as needed for pain   venlafaxine  XR (EFFEXOR -XR) 75 MG 24 hr capsule Take 75 mg by mouth daily.   vitamin C  (ASCORBIC ACID ) 500 MG tablet Take 500 mg by mouth daily.   No facility-administered encounter medications on file as of 10/15/2023.    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   Arrhythmia    left bundle branch block   Arthritis    Blood in stool    Chicken pox    Generalized headaches    History of blood transfusion    Kidney disease, chronic, stage III (GFR 30-59 ml/min) (HCC)    LBBB (left bundle branch block)    Renal disorder    Right lateral epicondylitis 08/13/2017   Thyroid  disease    UTI (lower urinary tract infection)    Past Surgical History:  Procedure Laterality Date   ABDOMINAL HYSTERECTOMY  1997   APPENDECTOMY  1981   BREAST EXCISIONAL BIOPSY Bilateral 1976   neg   BREAST SURGERY  1976   C[facility]IAC [facility]  [facility]   CARDIAC CATHETERIZATION  2006   [facility]   cystic fibrosis tumor removal  1983    RIGHT/LEFT HEART CATH AND CORONARY ANGIOGRAPHY Bilateral 07/15/2023   Procedure: RIGHT/LEFT HEART CATH AND CORONARY ANGIOGRAPHY;  Surgeon: Wenona Hamilton, MD;  Location: [facility] INVASIVE CV LAB;  Service: Cardiovascular;  Laterality: Bilateral;   THUMB AMPUTATION  1992   traumatic   TONSILLECTOMY AND ADENOIDECTOMY  1964   VISCERAL ANGIOGRAPHY N/A 06/29/2022   Procedure: VISCERAL ANGIOGRAPHY;  Surgeon: Jackquelyn Mass, MD;  Location: [facility] INVASIVE CV LAB;  Service: Cardiovascular;  Laterality: N/A;   VISCERAL ANGIOGRAPHY N/A 07/17/2022   Procedure: VISCERAL ANGIOGRAPHY;  Surgeon: Jackquelyn Mass, MD;  Location: [facility] INVASIVE CV LAB;  Service: Cardiovascular;  Laterality: N/A;   Family History  Problem Relation Age of Onset   Heart disease Mother    Arthritis Mother    Cancer Mother        breast   Hyperlipidemia Mother    Hypertension Mother  Heart attack Mother    Breast cancer Mother 69   Heart disease Father    Diabetes Father    Arthritis Father    Hypertension Father    Parkinson's disease Father    Hodgkin's lymphoma Father        hodgkins disease, prostate   Heart disease Sister    Diabetes Sister    Diabetes Maternal Aunt    Heart disease Maternal Grandmother    Diabetes Maternal Grandmother    Heart disease Maternal Grandfather    Heart disease Paternal Grandmother    Diabetes Paternal Grandmother    Stroke Paternal Grandfather    Heart disease Paternal Grandfather    Diabetes Paternal Grandfather    Breast cancer Cousin        2 pat cousins   Heart disease Brother 82       ami x 8,  4 vessel CABG    Diabetes Brother    Liver disease Brother    Lung disease Brother    Social History   Socioeconomic History   Marital status: Married    Spouse name: Not on file   Number of children: 3   Years of education: Not on file   Highest education level: Not on file  Occupational History   Occupation: Retired  Tobacco Use   Smoking status: Every Day     Current packs/day: 1.00    Average packs/day: 1 pack/day for 44.5 years (44.5 ttl pk-yrs)    Types: Cigarettes    Start date: 04/11/1979   Smokeless tobacco: Never   Tobacco comments:    1-3 cigarettes daily since 03/2023. Cutting back alot  Vaping Use   Vaping status: Never Used  Substance and Sexual Activity   Alcohol use: Yes    Comment: occasional   Drug use: No   Sexual activity: Not on file  Other Topics Concern   Not on file  Social History Narrative   Married to [employer]; Works for [employer], Engineer, structural. Quit smoking 2018; ocassional alcohol; lives near to [place].    Social Drivers of Health   Financial Resource Strain: Low Risk  (10/15/2023)   Overall Financial Resource Strain (CARDIA)    Difficulty of Paying Living Expenses: Not hard at all  Food Insecurity: No Food Insecurity (10/15/2023)   Hunger Vital Sign    Worried About Running Out of Food in the Last Year: Never true    Ran Out of Food in the Last Year: Never true  Transportation Needs: No Transportation Needs (10/15/2023)   PRAPARE - Administrator, Civil Service (Medical): No    Lack of Transportation (Non-Medical): No  Physical Activity: Inactive (10/15/2023)   Exercise Vital Sign    Days of Exercise per Week: 0 days    Minutes of Exercise per Session: 0 min  Stress: No Stress Concern Present (10/15/2023)   [employer] of Occupational Health - Occupational Stress Questionnaire    Feeling of Stress : Only a little  Social Connections: Moderately Integrated (10/15/2023)   Social Connection and Isolation Panel [NHANES]    Frequency of Communication with Friends and Family: More than three times a week    Frequency of Social Gatherings with Friends and Family: More than three times a week    Attends Religious Services: Never    Database administrator or Organizations: Yes    Attends Engineer, structural: More than 4 times per year    Marital Status: Married  Tobacco  Counseling Ready to quit: Not Answered Counseling given: Not Answered Tobacco comments: 1-3 cigarettes daily since 03/2023. Cutting back alot    Clinical Intake:  Pre-visit preparation completed: Yes  Pain : 0-10 Pain Score: 6  Pain Type: Chronic pain Pain Location: Abdomen Pain Descriptors / Indicators: Aching Pain Onset: More than a month ago Pain Frequency: Intermittent     BMI - recorded: 23.03 Nutritional Status: BMI of 19-24  Normal Nutritional Risks: Nausea/ vomitting/ diarrhea (diarrhea for several years) Diabetes: No  Lab Results  Component Value Date   HGBA1C 5.5 05/02/2022   HGBA1C 5.1 10/26/2015   HGBA1C 5.4 11/25/2013     How often do you need to have someone help you when you read instructions, pamphlets, or other written materials from your doctor or pharmacy?: 1 - Never  Interpreter Needed?: No  Information entered by :: R. Kenyon Eshleman LPN   Activities of Daily Living     10/15/2023   10:22 AM 07/15/2023    8:56 AM  In your present state of health, do you have any difficulty performing the following activities:  Hearing? 1 0  Comment has aids does not wear them   Vision? 0 0  Comment glasses   Difficulty concentrating or making decisions? 0 0  Walking or climbing stairs? 0   Dressing or bathing? 0   Doing errands, shopping? 0   Preparing Food and eating ? N   Using the Toilet? N   In the past six months, have you accidently leaked urine? N   Do you have problems with loss of bowel control? Y   Managing your Medications? N   Managing your Finances? N   Housekeeping or managing your Housekeeping? N     Patient Care Team: Thersia Flax, MD as PCP - General (Internal Medicine) Devorah Fonder, MD as PCP - Cardiology (Cardiology) Marquita Situ Magali Schmitz, MD as Consulting Physician (General Surgery) Brahmanday, Govinda R, MD as Consulting Physician (Oncology)  Indicate any recent Medical Services you may have received from other than [facility] providers  in the past year (date may be approximate).     Assessment:    This is a routine wellness examination for Cheryl [name].  Hearing/Vision screen Hearing Screening - Comments:: Has aids but does not wear Vision Screening - Comments:: glasses   Goals Addressed             This Visit's Progress    Patient Stated       Wants to eat and not get sick after eating       Depression Screen     10/15/2023   10:29 AM 08/12/2023    3:28 PM 05/14/2023    2:20 PM 05/08/2023    1:32 PM 03/28/2023    9:01 AM 02/12/2023    2:49 PM 11/12/2022    2:20 PM  PHQ 2/9 Scores  PHQ - 2 Score 0 2 2 2 2  0 4  PHQ- 9 Score 2 6 6 7 4  11     Fall Risk     10/15/2023   10:25 AM 08/12/2023    3:28 PM 05/14/2023    2:20 PM 05/08/2023    1:32 PM 02/12/2023    2:49 PM  Fall Risk   Falls in the past year? 0 0 0 0 0  Number falls in past yr: 0 0 0 0 0  Injury with Fall? 0 0 0 0 0  Risk for fall due to : No Fall Risks No  Fall Risks No Fall Risks No Fall Risks No Fall Risks  Follow up Falls prevention discussed;Falls evaluation completed Falls evaluation completed Falls evaluation completed Falls evaluation completed Falls evaluation completed    MEDICARE RISK AT HOME:  Medicare Risk at Home Any stairs in or around the home?: Yes If so, are there any without handrails?: No Home free of loose throw rugs in walkways, pet beds, electrical cords, etc?: Yes Adequate lighting in your home to reduce risk of falls?: Yes Life alert?: No Use of a cane, walker or w/c?: No Grab bars in the bathroom?: Yes Shower chair or bench in shower?: Yes Elevated toilet seat or a handicapped toilet?: Yes  TIMED UP AND GO:  Was the test performed?  No  Cognitive Function: 6CIT completed        10/15/2023   10:35 AM 10/15/2022   10:12 AM  6CIT Screen  What Year? 0 points 0 points  What month? 0 points 0 points  What time? 0 points 0 points  Count back from 20 0 points 0 points  Months in reverse 0 points 0 points   Repeat phrase 0 points 0 points  Total Score 0 points 0 points    Immunizations Immunization History  Administered Date(s) Administered   Pneumococcal Polysaccharide-23 07/27/2014   Tdap 03/19/2011, 07/26/2020    Screening Tests Health Maintenance  Topic Date Due   Hepatitis C Screening  Never done   MAMMOGRAM  08/08/2022   Lung Cancer Screening  07/19/2023   Medicare Annual Wellness (AWV)  10/15/2023   INFLUENZA VACCINE  12/27/2023   DTaP/Tdap/Td (3 - Td or Tdap) 07/27/2030   Colonoscopy  01/02/2033   DEXA SCAN  Completed   HPV VACCINES  Aged Out   Meningococcal B Vaccine  Aged Out   Pneumonia Vaccine 70+ Years old  Discontinued   COVID-19 Vaccine  Discontinued   Zoster Vaccines- Shingrix  Discontinued    Health Maintenance  Health Maintenance Due  Topic Date Due   Hepatitis C Screening  Never done   MAMMOGRAM  08/08/2022   Lung Cancer Screening  07/19/2023   Medicare Annual Wellness (AWV)  10/15/2023   Health Maintenance Items Addressed: Hepatitis C Screening ordered. Reminded patient to call and schedule her mammogram that was ordered 08/12/23. Patient will reach out to her pulmonologist about the lung cancer screening that was ordered 08/20/23. Patient declines vaccines.  Additional Screening:  Vision Screening: Recommended annual ophthalmology exams for early detection of glaucoma and other disorders of the eye.Up to date [employer]  Dental Screening: Recommended annual dental exams for proper oral hygiene  Community Resource Referral / Chronic Care Management: CRR required this visit?  No   CCM required this visit?  No   Plan:    I have personally reviewed and noted the following in the patient's chart:   Medical and social history Use of alcohol, tobacco or illicit drugs  Current medications and supplements including opioid prescriptions. Patient is currently taking opioid prescriptions. Information provided to patient regarding non-opioid  alternatives. Patient advised to discuss non-opioid treatment plan with their provider. Functional ability and status Nutritional status Physical activity Advanced directives List of other physicians Hospitalizations, surgeries, and ER visits in previous 12 months Vitals Screenings to include cognitive, depression, and falls Referrals and appointments  In addition, I have reviewed and discussed with patient certain preventive protocols, quality metrics, and best practice recommendations. A written personalized care plan for preventive services as well as general preventive health recommendations  were provided to patient.   Felicitas Horse, LPN   1/61/0960   After Visit Summary: (MyChart) Due to this being a telephonic visit, the after visit summary with patients personalized plan was offered to patient via MyChart   Notes: Nothing significant to report at this time.

## 2023-10-15 NOTE — Patient Instructions (Signed)
 Cheryl [name] , Thank you for taking time out of your busy schedule to complete your Annual Wellness Visit with me. I enjoyed our conversation and look forward to speaking with you again next year. I, as well as your care team,  appreciate your ongoing commitment to your health goals. Please review the following plan we discussed and let me know if I can assist you in the future. Your Game plan/ To Do List    Referrals: If you haven't heard from the office you've been referred to, please reach out to them at the phone provided.   Remember to call and schedule your mammogram that was ordered 08/12/23. Call and follow-up on the lung cancer screening that was ordered 08/20/23.  Consider updating your vaccines. Your hepatitis C lab test has been ordered that can be done the next time you have lab work at the office.  Follow up Visits: Next Medicare AWV with our clinical staff: 10/20/24 @ 10:50   Have you seen your provider in the last 6 months (3 months if uncontrolled diabetes)? Yes Next Office Visit with your provider: 11/12/23  Clinician Recommendations:  Aim for 30 minutes of exercise or brisk walking, 6-8 glasses of water, and 5 servings of fruits and vegetables each day.       This is a list of the screening recommended for you and due dates:  Health Maintenance  Topic Date Due   Hepatitis C Screening  Never done   Mammogram  08/08/2022   Screening for Lung Cancer  07/19/2023   Flu Shot  12/27/2023   Medicare Annual Wellness Visit  10/14/2024   DTaP/Tdap/Td vaccine (3 - Td or Tdap) 07/27/2030   Colon Cancer Screening  01/02/2033   DEXA scan (bone density measurement)  Completed   HPV Vaccine  Aged Out   Meningitis B Vaccine  Aged Out   Pneumonia Vaccine  Discontinued   COVID-19 Vaccine  Discontinued   Zoster (Shingles) Vaccine  Discontinued    Advanced directives: (Copy Requested) Please bring a copy of your health care power of attorney and living will to the office to be added to your  chart at your convenience. You can mail to [facility] [address] [place], [place] [address] or email to [email or web address] Advance Care Planning is important because it:  [x]  Makes sure you receive the medical care that is consistent with your values, goals, and preferences  [x]  It provides guidance to your family and loved ones and reduces their decisional burden about whether or not they are making the right decisions based on your wishes.Managing Pain Without Opioids Opioids are strong medicines used to treat moderate to severe pain. For some people, especially those who have long-term (chronic) pain, opioids may not be the best choice for pain management due to: Side effects like nausea, constipation, and sleepiness. The risk of addiction (opioid use disorder). The longer you take opioids, the greater your risk of addiction. Pain that lasts for more than 3 months is called chronic pain. Managing chronic pain usually requires more than one approach and is often provided by a team of health care providers working together (multidisciplinary approach). Pain management may be done at a pain management center or pain clinic. How to manage pain without the use of opioids Use non-opioid medicines Non-opioid medicines for pain may include: Over-the-counter or prescription non-steroidal anti-inflammatory drugs (NSAIDs). These may be the first medicines used for pain. They work well for muscle and bone pain, and  they reduce swelling. Acetaminophen . This over-the-counter medicine may work well for milder pain but not swelling. Antidepressants. These may be used to treat chronic pain. A certain type of antidepressant (tricyclics) is often used. These medicines are given in lower doses for pain than when used for depression. Anticonvulsants. These are usually used to treat seizures but may also reduce nerve (neuropathic) pain. Muscle relaxants. These relieve pain caused by  sudden muscle tightening (spasms). You may also use a pain medicine that is applied to the skin as a patch, cream, or gel (topical analgesic), such as a numbing medicine. These may cause fewer side effects than medicines taken by mouth. Do certain therapies as directed Some therapies can help with pain management. They include: Physical therapy. You will do exercises to gain strength and flexibility. A physical therapist may teach you exercises to move and stretch parts of your body that are weak, stiff, or painful. You can learn these exercises at physical therapy visits and practice them at home. Physical therapy may also involve: Massage. Heat wraps or applying heat or cold to affected areas. Electrical signals that interrupt pain signals (transcutaneous electrical nerve stimulation, TENS). Weak lasers that reduce pain and swelling (low-level laser therapy). Signals from your body that help you learn to regulate pain (biofeedback). Occupational therapy. This helps you to learn ways to function at home and work with less pain. Recreational therapy. This involves trying new activities or hobbies, such as a physical activity or drawing. Mental health therapy, including: Cognitive behavioral therapy (CBT). This helps you learn coping skills for dealing with pain. Acceptance and commitment therapy (ACT) to change the way you think and react to pain. Relaxation therapies, including muscle relaxation exercises and mindfulness-based stress reduction. Pain management counseling. This may be individual, family, or group counseling.  Receive medical treatments Medical treatments for pain management include: Nerve block injections. These may include a pain blocker and anti-inflammatory medicines. You may have injections: Near the spine to relieve chronic back or neck pain. Into joints to relieve back or joint pain. Into nerve areas that supply a painful area to relieve body pain. Into muscles (trigger  point injections) to relieve some painful muscle conditions. A medical device placed near your spine to help block pain signals and relieve nerve pain or chronic back pain (spinal cord stimulation device). Acupuncture. Follow these instructions at home Medicines Take over-the-counter and prescription medicines only as told by your health care provider. If you are taking pain medicine, ask your health care providers about possible side effects to watch out for. Do not drive or use heavy machinery while taking prescription opioid pain medicine. Lifestyle  Do not use drugs or alcohol to reduce pain. If you drink alcohol, limit how much you have to: 0-1 drink a day for women who are not pregnant. 0-2 drinks a day for men. Know how much alcohol is in a drink. In the [place] one drink equals one 12 oz bottle of beer (355 mL), one 5 oz glass of wine (148 mL), or one 1 oz glass of hard liquor (44 mL). Do not use any products that contain nicotine  or tobacco. These products include cigarettes, chewing tobacco, and vaping devices, such as e-cigarettes. If you need help quitting, ask your health care provider. Eat a healthy diet and maintain a healthy weight. Poor diet and excess weight may make pain worse. Eat foods that are high in fiber. These include fresh fruits and vegetables, whole grains, and beans. Limit foods that are  high in fat and processed sugars, such as fried and sweet foods. Exercise regularly. Exercise lowers stress and may help relieve pain. Ask your health care provider what activities and exercises are safe for you. If your health care provider approves, join an exercise class that combines movement and stress reduction. Examples include yoga and tai chi. Get enough sleep. Lack of sleep may make pain worse. Lower stress as much as possible. Practice stress reduction techniques as told by your therapist. General instructions Work with all your pain management providers to find the  treatments that work best for you. You are an important member of your pain management team. There are many things you can do to reduce pain on your own. Consider joining an online or in-person support group for people who have chronic pain. Keep all follow-up visits. This is important. Where to find more information You can find more information about managing pain without opioids from: American Academy of Pain Medicine: painmed.org Institute for Chronic Pain: instituteforchronicpain.org American Chronic Pain Association: theacpa.org Contact a health care provider if: You have side effects from pain medicine. Your pain gets worse or does not get better with treatments or home therapy. You are struggling with anxiety or depression. Summary Many types of pain can be managed without opioids. Chronic pain may respond better to pain management without opioids. Pain is best managed when you and a team of health care providers work together. Pain management without opioids may include non-opioid medicines, medical treatments, physical therapy, mental health therapy, and lifestyle changes. Tell your health care providers if your pain gets worse or is not being managed well enough. This information is not intended to replace advice given to you by your health care provider. Make sure you discuss any questions you have with your health care provider. Document Revised: 08/24/2020 Document Reviewed: 08/24/2020  Elsevier Patient Education  2024 [employer].

## 2023-10-17 DIAGNOSIS — I774 Celiac artery compression syndrome: Secondary | ICD-10-CM | POA: Diagnosis not present

## 2023-10-22 ENCOUNTER — Other Ambulatory Visit: Payer: PPO

## 2023-10-22 ENCOUNTER — Ambulatory Visit: Payer: PPO | Admitting: Nurse Practitioner

## 2023-10-22 ENCOUNTER — Ambulatory Visit: Payer: PPO

## 2023-10-24 ENCOUNTER — Inpatient Hospital Stay: Payer: Self-pay

## 2023-10-24 ENCOUNTER — Encounter: Payer: Self-pay | Admitting: Nurse Practitioner

## 2023-10-24 ENCOUNTER — Inpatient Hospital Stay (HOSPITAL_BASED_OUTPATIENT_CLINIC_OR_DEPARTMENT_OTHER): Payer: Self-pay | Admitting: Nurse Practitioner

## 2023-10-24 ENCOUNTER — Inpatient Hospital Stay: Payer: Self-pay | Attending: Internal Medicine

## 2023-10-24 VITALS — BP 105/70 | HR 63 | Temp 97.9°F | Resp 18 | Ht 63.0 in | Wt 132.0 lb

## 2023-10-24 DIAGNOSIS — E538 Deficiency of other specified B group vitamins: Secondary | ICD-10-CM | POA: Insufficient documentation

## 2023-10-24 DIAGNOSIS — Z800 Family history of malignant neoplasm of digestive organs: Secondary | ICD-10-CM | POA: Insufficient documentation

## 2023-10-24 DIAGNOSIS — F17210 Nicotine dependence, cigarettes, uncomplicated: Secondary | ICD-10-CM | POA: Diagnosis not present

## 2023-10-24 DIAGNOSIS — Z807 Family history of other malignant neoplasms of lymphoid, hematopoietic and related tissues: Secondary | ICD-10-CM | POA: Diagnosis not present

## 2023-10-24 DIAGNOSIS — D563 Thalassemia minor: Secondary | ICD-10-CM

## 2023-10-24 DIAGNOSIS — D508 Other iron deficiency anemias: Secondary | ICD-10-CM

## 2023-10-24 DIAGNOSIS — D561 Beta thalassemia: Secondary | ICD-10-CM | POA: Diagnosis not present

## 2023-10-24 DIAGNOSIS — Z803 Family history of malignant neoplasm of breast: Secondary | ICD-10-CM | POA: Insufficient documentation

## 2023-10-24 DIAGNOSIS — I739 Peripheral vascular disease, unspecified: Secondary | ICD-10-CM | POA: Insufficient documentation

## 2023-10-24 LAB — CBC WITH DIFFERENTIAL (CANCER CENTER ONLY)
Abs Immature Granulocytes: 0.03 10*3/uL (ref 0.00–0.07)
Basophils Absolute: 0.1 10*3/uL (ref 0.0–0.1)
Basophils Relative: 1 %
Eosinophils Absolute: 0.2 10*3/uL (ref 0.0–0.5)
Eosinophils Relative: 3 %
HCT: 27.6 % — ABNORMAL LOW (ref 36.0–46.0)
Hemoglobin: 8.4 g/dL — ABNORMAL LOW (ref 12.0–15.0)
Immature Granulocytes: 1 %
Lymphocytes Relative: 24 %
Lymphs Abs: 1.5 10*3/uL (ref 0.7–4.0)
MCH: 22 pg — ABNORMAL LOW (ref 26.0–34.0)
MCHC: 30.4 g/dL (ref 30.0–36.0)
MCV: 72.4 fL — ABNORMAL LOW (ref 80.0–100.0)
Monocytes Absolute: 0.6 10*3/uL (ref 0.1–1.0)
Monocytes Relative: 9 %
Neutro Abs: 4 10*3/uL (ref 1.7–7.7)
Neutrophils Relative %: 62 %
Platelet Count: 265 10*3/uL (ref 150–400)
RBC: 3.81 MIL/uL — ABNORMAL LOW (ref 3.87–5.11)
RDW: 18.7 % — ABNORMAL HIGH (ref 11.5–15.5)
WBC Count: 6.4 10*3/uL (ref 4.0–10.5)
nRBC: 1.4 % — ABNORMAL HIGH (ref 0.0–0.2)

## 2023-10-24 LAB — BASIC METABOLIC PANEL - CANCER CENTER ONLY
Anion gap: 7 (ref 5–15)
BUN: 14 mg/dL (ref 8–23)
CO2: 24 mmol/L (ref 22–32)
Calcium: 8.3 mg/dL — ABNORMAL LOW (ref 8.9–10.3)
Chloride: 107 mmol/L (ref 98–111)
Creatinine: 0.73 mg/dL (ref 0.44–1.00)
GFR, Estimated: >60 mL/min (ref >60)
Glucose, Bld: 94 mg/dL (ref 70–99)
Potassium: 4.3 mmol/L (ref 3.5–5.1)
Sodium: 138 mmol/L (ref 135–145)

## 2023-10-24 LAB — IRON AND TIBC
Iron: 124 ug/dL (ref 28–170)
Saturation Ratios: 44 % — ABNORMAL HIGH (ref 10.4–31.8)
TIBC: 284 ug/dL (ref 250–450)
UIBC: 160 ug/dL

## 2023-10-24 LAB — FERRITIN: Ferritin: 281 ng/mL (ref 11–307)

## 2023-10-24 LAB — VITAMIN B12: Vitamin B-12: 445 pg/mL (ref 180–914)

## 2023-10-24 NOTE — Progress Notes (Signed)
 Patient is doing well, no new questions or concerns for the doctor today. She did tell me that her appetite is not that good but she is used to that.

## 2023-10-24 NOTE — Addendum Note (Signed)
 Addended by: Vivi Grit on: 10/24/2023 03:10 PM   Modules accepted: Orders

## 2023-10-24 NOTE — Progress Notes (Addendum)
 [facility] CONSULT NOTE  Patient Care Team: Thersia Flax, MD as PCP - General (Internal Medicine) Devorah Fonder, MD as PCP - Cardiology (Cardiology) Marquita Situ Magali Schmitz, MD as Consulting Physician (General Surgery) Gwyn Leos, MD as Consulting Physician (Oncology) Marc Senior, MD as Consulting Physician (Pulmonary Disease)  CHIEF COMPLAINTS/PURPOSE OF CONSULTATION: Anemia.  HEMATOLOGY HISTORY  # CHRONIC MICROCYTIC ANEMIA- BETA THALASSEMIA MINOR [colo- 2016; Dr. Rozanne Corners July 2017- IV trial of Venofer  x4   # Lung nodules [CT- sub cm Jan 2017]-2019 CT scan stable. Benign.  HISTORY OF PRESENTING ILLNESS: Alone. Walking independently.   Cheryl [name] 68 y.o. female with a history of beta thalassemia minor; also iron  deficiency anemia/B12 deficiency is here for follow-up. Feels well and denies complaints. She continues oral iron  and b12. Denies any neurologic complaints. Denies recent fevers or illnesses. Denies any easy bleeding or bruising. No melena or hematochezia. No pica or restless leg. Reports good appetite and denies weight loss. Denies chest pain. Denies any nausea, vomiting, constipation, or diarrhea. Denies urinary complaints. Patient offers no further specific complaints today.   Review of Systems  Constitutional:  Positive for malaise/fatigue. Negative for chills, diaphoresis, fever and weight loss.  Eyes:  Negative for double vision.  Respiratory:  Negative for cough, hemoptysis and sputum production.   Cardiovascular:  Negative for chest pain, palpitations, orthopnea and leg swelling.  Gastrointestinal:  Negative for abdominal pain, blood in stool, constipation, diarrhea, heartburn, melena, nausea and vomiting.  Genitourinary:  Negative for dysuria, frequency and urgency.  Musculoskeletal:  Negative for back pain and joint pain.  Skin:  Negative for itching and rash.  Neurological:  Negative for dizziness, tingling, focal  weakness, weakness and headaches.  Endo/Heme/Allergies:  Does not bruise/bleed easily.  Psychiatric/Behavioral:  Negative for depression. The patient is not nervous/anxious and does not have insomnia.    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   Arrhythmia    left bundle branch block   Arthritis    Blood in stool    Chicken pox    Generalized headaches    History of blood transfusion    Kidney disease, chronic, stage III (GFR 30-59 ml/min) (HCC)    LBBB (left bundle branch block)    Renal disorder    Right lateral epicondylitis 08/13/2017   Thyroid  disease    UTI (lower urinary tract infection)     SURGICAL HISTORY: Past Surgical History:  Procedure Laterality Date   ABDOMINAL HYSTERECTOMY  1997   APPENDECTOMY  1981   BREAST EXCISIONAL BIOPSY Bilateral 1976   neg   BREAST SURGERY  1976   CARDIAC CATHETERIZATION  2004   [facility]   CARDIAC CATHETERIZATION  2006   [facility]   cystic fibrosis tumor removal  1983   RIGHT/LEFT HEART CATH AND CORONARY ANGIOGRAPHY Bilateral 07/15/2023   Procedure: RIGHT/LEFT HEART CATH AND CORONARY ANGIOGRAPHY;  Surgeon: Wenona Hamilton, MD;  Location: [facility] INVASIVE CV LAB;  Service: Cardiovascular;  Laterality: Bilateral;   THUMB AMPUTATION  1992   traumatic   TONSILLECTOMY AND ADENOIDECTOMY  1964   VISCERAL ANGIOGRAPHY N/A 06/29/2022   Procedure: VISCERAL ANGIOGRAPHY;  Surgeon: Jackquelyn Mass, MD;  Location: [facility] INVASIVE CV LAB;  Service: Cardiovascular;  Laterality: N/A;   VISCERAL ANGIOGRAPHY N/A 07/17/2022  Procedure: VISCERAL ANGIOGRAPHY;  Surgeon: Jackquelyn Mass, MD;  Location: [facility] INVASIVE CV LAB;  Service: Cardiovascular;  Laterality: N/A;    SOCIAL HISTORY: Social History   Socioeconomic History    Marital status: Married    Spouse name: Not on file   Number of children: 3   Years of education: Not on file   Highest education level: Not on file  Occupational History   Occupation: Retired  Tobacco Use   Smoking status: Every Day    Current packs/day: 1.00    Average packs/day: 1 pack/day for 44.5 years (44.5 ttl pk-yrs)    Types: Cigarettes    Start date: 04/11/1979   Smokeless tobacco: Never   Tobacco comments:    1-3 cigarettes daily since 03/2023. Cutting back alot  Vaping Use   Vaping status: Never Used  Substance and Sexual Activity   Alcohol use: Yes    Comment: occasional   Drug use: No   Sexual activity: Not on file  Other Topics Concern   Not on file  Social History Narrative   Married to [employer]; Works for [employer], Engineer, structural. Quit smoking 2018; ocassional alcohol; lives near to [place].    Social Drivers of Health   Financial Resource Strain: Low Risk  (10/15/2023)   Overall Financial Resource Strain (CARDIA)    Difficulty of Paying Living Expenses: Not hard at all  Food Insecurity: No Food Insecurity (10/15/2023)   Hunger Vital Sign    Worried About Running Out of Food in the Last Year: Never true    Ran Out of Food in the Last Year: Never true  Transportation Needs: No Transportation Needs (10/15/2023)   PRAPARE - Administrator, Civil Service (Medical): No    Lack of Transportation (Non-Medical): No  Physical Activity: Inactive (10/15/2023)   Exercise Vital Sign    Days of Exercise per Week: 0 days    Minutes of Exercise per Session: 0 min  Stress: No Stress Concern Present (10/15/2023)   [employer] of Occupational Health - Occupational Stress Questionnaire    Feeling of Stress : Only a little  Social Connections: Moderately Integrated (10/15/2023)   Social Connection and Isolation Panel [NHANES]    Frequency of Communication with Friends and Family: More than three times a week    Frequency of Social Gatherings with  Friends and Family: More than three times a week    Attends Religious Services: Never    Database administrator or Organizations: Yes    Attends Engineer, structural: More than 4 times per year    Marital Status: Married  Catering manager Violence: Not At Risk (10/17/2023)   Received from [facility]   Humiliation, Afraid, Rape, and Kick questionnaire    Fear of Current or Ex-Partner: No    Emotionally Abused: No    Physically Abused: No    Sexually Abused: No    FAMILY HISTORY: Family History  Problem Relation Age of Onset   Heart disease Mother    Arthritis Mother    Cancer Mother        breast   Hyperlipidemia Mother    Hypertension Mother    Heart attack Mother    Breast cancer Mother 69   Heart disease Father    Diabetes Father    Arthritis Father    Hypertension Father    Parkinson's disease Father    Hodgkin's lymphoma Father        hodgkins disease, prostate  Heart disease Sister    Diabetes Sister    Diabetes Maternal Aunt    Heart disease Maternal Grandmother    Diabetes Maternal Grandmother    Heart disease Maternal Grandfather    Heart disease Paternal Grandmother    Diabetes Paternal Grandmother    Stroke Paternal Grandfather    Heart disease Paternal Grandfather    Diabetes Paternal Grandfather    Breast cancer Cousin        2 pat cousins   Heart disease Brother 72       ami x 8,  4 vessel CABG    Diabetes Brother    Liver disease Brother    Lung disease Brother     ALLERGIES:  is allergic to peanuts [peanut oil], penicillins, sulfa antibiotics, corticosteroids, influenza vac split [influenza virus vaccine], mobic  [meloxicam ], nickel, nsaids, penicillin g, and prednisone .  MEDICATIONS:  Current Outpatient Medications  Medication Sig Dispense Refill   Albuterol -Budesonide (AIRSUPRA ) 90-80 MCG/ACT AERO Inhale 1 puff into the lungs every 6 (six) hours as needed. 5.9 g 11   amitriptyline  (ELAVIL ) 50 MG tablet TAKE 1 TABLET BY MOUTH AT  BEDTIME 90 tablet 0   buPROPion  (WELLBUTRIN ) 100 MG tablet Take 1.5 tablets (150 mg total) by mouth 2 (two) times daily. 90 tablet 1   Calcium  Carbonate-Vit D-Min (CALCIUM  1200 PO) Take 1,200 mg by mouth daily.     carvedilol  (COREG ) 6.25 MG tablet Take 1 tablet (6.25 mg total) by mouth 2 (two) times daily with a meal. 180 tablet 3   Cholecalciferol (VITAMIN D ) 125 MCG (5000 UT) CAPS Take 5,000 Units by mouth daily.     folic acid  (FOLVITE ) 1 MG tablet Take 1 tablet (1 mg total) by mouth daily. 90 tablet 3   furosemide  (LASIX ) 20 MG tablet Take 1 tablet (20 mg total) by mouth daily as needed. 30 tablet 3   gabapentin  (NEURONTIN ) 300 MG capsule Take 1 capsule (300 mg total) by mouth 3 (three) times daily. 270 capsule 1   HYDROcodone -acetaminophen  (NORCO/VICODIN) 5-325 MG tablet Take 1 tablet by mouth 2 (two) times daily as needed for moderate pain (pain score 4-6). 60 tablet 0   nitroGLYCERIN  (NITROSTAT ) 0.4 MG SL tablet Place 1 tablet (0.4 mg total) under the tongue every 5 (five) minutes as needed for chest pain. DISSOLVE 1 TABLET UNDER THE TONGUE EVERY 5 MINUTES AS  NEEDED FOR CHEST PAIN. MAX  OF 3 TABLETS IN 15 MINUTES. CALL 911 IF PAIN PERSISTS. 100 tablet 3   omeprazole  (PRILOSEC) 40 MG capsule Take 1 capsule (40 mg total) by mouth daily. 90 capsule 1   ondansetron  (ZOFRAN ) 8 MG tablet Take 1 tablet (8 mg total) by mouth every 8 (eight) hours as needed for nausea or vomiting. 60 tablet 1   Prasterone, DHEA, 10 MG CAPS Take 10 mg by mouth daily.     progesterone  (PROMETRIUM ) 200 MG capsule Take 200 mg by mouth at bedtime.     rOPINIRole  (REQUIP ) 1 MG tablet TAKE 1 TABLET BY MOUTH THREE TIMES DAILY 90 tablet 0   rosuvastatin  (CRESTOR ) 10 MG tablet Take 1 tablet (10 mg total) by mouth daily. 90 tablet 3   traMADol  (ULTRAM ) 50 MG tablet Take 1 tablet by mouth twice daily as needed for pain 60 tablet 2   venlafaxine  XR (EFFEXOR -XR) 75 MG 24 hr capsule Take 75 mg by mouth daily.     vitamin C   (ASCORBIC ACID ) 500 MG tablet Take 500 mg by mouth daily.  No current facility-administered medications for this visit.   PHYSICAL EXAMINATION: Vitals:   10/24/23 1338  BP: 105/70  Pulse: 63  Resp: 18  Temp: 97.9 F (36.6 C)  SpO2: 98%   Filed Weights   10/24/23 1338  Weight: 132 lb (59.9 kg)   Physical Exam Vitals reviewed.  Constitutional:      Appearance: She is not ill-appearing.  HENT:     Head: Normocephalic and atraumatic.  Pulmonary:     Effort: Pulmonary effort is normal.  Abdominal:     General: There is no distension.  Musculoskeletal:        General: No tenderness.  Skin:    General: Skin is warm.     Coloration: Skin is not pale.  Neurological:     Mental Status: She is alert and oriented to person, place, and time.  Psychiatric:        Mood and Affect: Mood and affect normal.        Behavior: Behavior normal.    LABORATORY DATA:  I have reviewed the data as listed Lab Results  Component Value Date   WBC 6.4 10/24/2023   HGB 8.4 (L) 10/24/2023   HCT 27.6 (L) 10/24/2023   MCV 72.4 (L) 10/24/2023   PLT 265 10/24/2023   Recent Labs    04/23/23 1353 05/14/23 1505 06/24/23 1427 07/09/23 1415 07/15/23 0949 07/15/23 0954 10/24/23 1307  NA 139  --  137 139 142 142 138  K 4.2  --  4.1 4.2 4.0 4.0 4.3  CL 105  --  104 105  --   --  107  CO2 22  --  23 23  --   --  24  GLUCOSE 111*  --  96 109*  --   --  94  BUN 16  --  14 8  --   --  14  CREATININE 1.18*  --  0.96 0.93  --   --  0.73  CALCIUM  9.1  --  9.2 8.8  --   --  8.3*  GFRNONAA 51*  --  >60  --   --   --  >60  PROT 7.3 6.8 7.0  --   --   --   --   ALBUMIN 4.7 4.5 4.6  --   --   --   --   AST 20 18 18   --   --   --   --   ALT 19 22 20   --   --   --   --   ALKPHOS 90 90 80  --   --   --   --   BILITOT 1.4* 0.9 1.1  --   --   --   --   BILIDIR  --  0.2  --   --   --   --   --    Iron /TIBC/Ferritin/ %Sat    Component Value Date/Time   IRON  124 10/24/2023 1307   IRON  85 11/25/2019  1232   IRON  77 09/01/2014 0459   TIBC 284 10/24/2023 1307   TIBC 270 11/25/2019 1232   TIBC 318 09/01/2014 0459   FERRITIN 281 10/24/2023 1307   FERRITIN 339 (H) 11/25/2019 1232   FERRITIN 124 09/01/2014 0459   IRONPCTSAT 44 (H) 10/24/2023 1307   IRONPCTSAT 31 11/25/2019 1232   IRONPCTSAT 24.2 09/01/2014 0459     No results found.  ASSESSMENT & PLAN:   # Thalassemia minor- Chronic microcytic anemia/history of beta thalassemia -  out of proportion to her anemia. S/p IV venofer  x 4 in fall of 2017.   # Today hemoglobin is 8.4 Iron  stores pending. Hold iv venofer . Continue dietary iron .    # Chronic diarrhea- AUG 2024- [colonoscopy; [facility]]- Anus nodule, biopsy:-Low-grade squamous intraepithelial lesion - s/p NOV 5th- 2024- stable.     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 Distal branches of the celiac artery are patent - s/p vascular evaluation at [facility]. Plan for surgery in July for stent.    # Family Hx: hemochromatosis [brother- cirrhosis- liver cancer-]   # B12 deficiency - on PO B12. Ok to hold injection pending today's results. Continue oral b12   # active smoker- in process of quitting smoking-    # DISPOSITION: # b12 injection today; NO venofer  # follow up in 4 month- labs (cbc, bmp, b12, ferritin, iron  studies), Dr Valentine Gasmen, +/- b12 and/or venofer - la   No problem-specific Assessment & Plan notes found for this encounter.     Nelda Balsam, NP 10/24/2023 3:20 PM

## 2023-10-28 ENCOUNTER — Emergency Department
Admission: EM | Admit: 2023-10-28 | Discharge: 2023-10-28 | Disposition: A | Discharge status: Home / Self Care | Attending: Emergency Medicine | Admitting: Emergency Medicine

## 2023-10-28 ENCOUNTER — Other Ambulatory Visit: Payer: Self-pay

## 2023-10-28 ENCOUNTER — Emergency Department

## 2023-10-28 ENCOUNTER — Ambulatory Visit: Payer: Self-pay

## 2023-10-28 DIAGNOSIS — R112 Nausea with vomiting, unspecified: Secondary | ICD-10-CM

## 2023-10-28 DIAGNOSIS — Q2733 Arteriovenous malformation of digestive system vessel: Secondary | ICD-10-CM | POA: Diagnosis not present

## 2023-10-28 DIAGNOSIS — I728 Aneurysm of other specified arteries: Secondary | ICD-10-CM

## 2023-10-28 DIAGNOSIS — R197 Diarrhea, unspecified: Secondary | ICD-10-CM | POA: Insufficient documentation

## 2023-10-28 DIAGNOSIS — R1084 Generalized abdominal pain: Secondary | ICD-10-CM

## 2023-10-28 DIAGNOSIS — R160 Hepatomegaly, not elsewhere classified: Secondary | ICD-10-CM | POA: Diagnosis not present

## 2023-10-28 DIAGNOSIS — K5730 Diverticulosis of large intestine without perforation or abscess without bleeding: Secondary | ICD-10-CM | POA: Diagnosis not present

## 2023-10-28 LAB — CBC
HCT: 33.6 % — ABNORMAL LOW (ref 36.0–46.0)
Hemoglobin: 10.3 g/dL — ABNORMAL LOW (ref 12.0–15.0)
MCH: 22.1 pg — ABNORMAL LOW (ref 26.0–34.0)
MCHC: 30.7 g/dL (ref 30.0–36.0)
MCV: 72.1 fL — ABNORMAL LOW (ref 80.0–100.0)
Platelets: 265 10*3/uL (ref 150–400)
RBC: 4.66 MIL/uL (ref 3.87–5.11)
RDW: 18.9 % — ABNORMAL HIGH (ref 11.5–15.5)
WBC: 8.5 10*3/uL (ref 4.0–10.5)
nRBC: 0.9 % — ABNORMAL HIGH (ref 0.0–0.2)

## 2023-10-28 LAB — URINALYSIS, ROUTINE W REFLEX MICROSCOPIC
Bilirubin Urine: NEGATIVE
Glucose, UA: NEGATIVE mg/dL
Hgb urine dipstick: NEGATIVE
Ketones, ur: 5 mg/dL — AB
Leukocytes,Ua: NEGATIVE
Nitrite: NEGATIVE
Protein, ur: NEGATIVE mg/dL
Specific Gravity, Urine: >1.046 — ABNORMAL HIGH (ref 1.005–1.030)
pH: 6 (ref 5.0–8.0)

## 2023-10-28 LAB — COMPREHENSIVE METABOLIC PANEL WITH GFR
ALT: 17 U/L (ref 0–44)
AST: 20 U/L (ref 15–41)
Albumin: 5.1 g/dL — ABNORMAL HIGH (ref 3.5–5.0)
Alkaline Phosphatase: 98 U/L (ref 38–126)
Anion gap: 12 (ref 5–15)
BUN: 10 mg/dL (ref 8–23)
CO2: 23 mmol/L (ref 22–32)
Calcium: 9.7 mg/dL (ref 8.9–10.3)
Chloride: 105 mmol/L (ref 98–111)
Creatinine, Ser: 0.77 mg/dL (ref 0.44–1.00)
GFR, Estimated: >60 mL/min (ref >60)
Glucose, Bld: 102 mg/dL — ABNORMAL HIGH (ref 70–99)
Potassium: 4 mmol/L (ref 3.5–5.1)
Sodium: 140 mmol/L (ref 135–145)
Total Bilirubin: 1.6 mg/dL — ABNORMAL HIGH (ref 0.0–1.2)
Total Protein: 8.1 g/dL (ref 6.5–8.1)

## 2023-10-28 LAB — LIPASE, BLOOD: Lipase: 33 U/L (ref 11–51)

## 2023-10-28 MED ORDER — LACTATED RINGERS IV BOLUS
1000.0000 mL | Freq: Once | INTRAVENOUS | Status: AC
  Administered 2023-10-28: 1000 mL via INTRAVENOUS

## 2023-10-28 MED ORDER — OXYCODONE HCL 5 MG PO TABS: 5.0000 mg | ORAL_TABLET | Freq: Three times a day (TID) | ORAL | 0 refills | Status: DC | PRN

## 2023-10-28 MED ORDER — ONDANSETRON HCL 4 MG PO TABS: 4.0000 mg | ORAL_TABLET | Freq: Three times a day (TID) | ORAL | 0 refills | Status: DC | PRN

## 2023-10-28 MED ORDER — MORPHINE SULFATE (PF) 4 MG/ML IV SOLN
4.0000 mg | Freq: Once | INTRAVENOUS | Status: AC
  Administered 2023-10-28: 4 mg via INTRAVENOUS
  Filled 2023-10-28: qty 1

## 2023-10-28 MED ORDER — ONDANSETRON HCL 4 MG/2ML IJ SOLN
4.0000 mg | Freq: Once | INTRAMUSCULAR | Status: AC
  Administered 2023-10-28: 4 mg via INTRAVENOUS
  Filled 2023-10-28: qty 2

## 2023-10-28 MED ORDER — IOHEXOL 350 MG/ML SOLN
75.0000 mL | Freq: Once | INTRAVENOUS | Status: AC | PRN
  Administered 2023-10-28: 75 mL via INTRAVENOUS

## 2023-10-28 NOTE — ED Triage Notes (Signed)
 Pt to ED via POV from home. Pt states N/V/D for a few weeks. 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

## 2023-10-28 NOTE — Telephone Encounter (Signed)
 Copied from CRM [id number]. Topic: Clinical - Red Word Triage >> Oct 28, 2023  3:24 PM [place] J wrote: [facility] that prompted transfer to Nurse Triage: Patient has been constantly having diarrhea and vomiting.  Chief Complaint: diarrhea and vomiting; more than 7 times vomiting today and diarrhea is constant Symptoms: see above Frequency: constant Pertinent Negatives: Patient denies blood in stool Disposition: [x] ED /[] Urgent Care (no appt availability in office) / [] Appointment(In office/virtual)/ []  [facility]/ [] Home Care/ [] Refused Recommended Disposition /[] [facility]/ []  Follow-up with PCP Additional Notes: instructed to go to the ER; Pcp office updated; states can't keep food down and diarrhea is constant.   Reason for Disposition  [1] SEVERE diarrhea (e.g., 7 or more times / day more than normal) AND [2] age > 60 years  Answer Assessment - Initial Assessment Questions 1. DIARRHEA SEVERITY: "How bad is the diarrhea?" "How many more stools have you had in the past 24 hours than normal?"    - NO DIARRHEA (SCALE 0)   - MILD (SCALE 1-3): Few loose or mushy BMs; increase of 1-3 stools over normal daily number of stools; mild increase in ostomy output.   -  MODERATE (SCALE 4-7): Increase of 4-6 stools daily over normal; moderate increase in ostomy output.   -  SEVERE (SCALE 8-10; OR "WORST POSSIBLE"): Increase of 7 or more stools daily over normal; moderate increase in ostomy output; incontinence.     severe 2. ONSET: "When did the diarrhea begin?"      Going on for 1 week 3. BM CONSISTENCY: "How loose or watery is the diarrhea?"      Diarrhea, hx [place] 4. VOMITING: "Are you also vomiting?" If Yes, ask: "How many times in the past 24 hours?"      6 times 5. ABDOMEN PAIN: "Are you having any abdomen pain?" If Yes, ask: "What does it feel like?" (e.g., crampy, dull, intermittent, constant)      To ER 6. ABDOMEN PAIN SEVERITY: If present, ask: "How bad is the pain?"   (e.g., Scale 1-10; mild, moderate, or severe)   - MILD (1-3): doesn't interfere with normal activities, abdomen soft and not tender to touch    - MODERATE (4-7): interferes with normal activities or awakens from sleep, abdomen tender to touch    - SEVERE (8-10): excruciating pain, doubled over, unable to do any normal activities       ER 7. ORAL INTAKE: If vomiting, "Have you been able to drink liquids?" "How much liquids have you had in the past 24 hours?"     ER 8. HYDRATION: "Any signs of dehydration?" (e.g., dry mouth [not just dry lips], too weak to stand, dizziness, new weight loss) "When did you last urinate?"     ER 9. EXPOSURE: "Have you traveled to a foreign country recently?" "Have you been exposed to anyone with diarrhea?" "Could you have eaten any food that was spoiled?"     ER 10. ANTIBIOTIC USE: "Are you taking antibiotics now or have you taken antibiotics in the past 2 months?"       ER 11. OTHER SYMPTOMS: "Do you have any other symptoms?" (e.g., fever, blood in stool)       To ER 12. PREGNANCY: "Is there any chance you are pregnant?" "When was your last menstrual period?"       na  Protocols used: [facility]

## 2023-10-28 NOTE — Telephone Encounter (Signed)
 Spoke with pt to follow up and see if she was going to go to the ED. Pt stated that she is going to go as soon as she can get herself up and ready. Pt stated that she has been having diarrhea and vomiting since last week. Pt has not been able to keep anything on her stomach. She stated last night she was double over with abdominal pain. I advised pt that she definitely needs to be evaluated at the ED. Pt gave a verbal understanding.

## 2023-10-28 NOTE — ED Provider Notes (Signed)
 [facility] Provider Note    Event Date/Time   First MD Initiated Contact with Patient 10/28/23 1911     (approximate)   History   Abdominal Pain   HPI Cheryl [name] is a 68 y.o. 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  Patient states over the past week she has had generalized abdominal pain associated with nausea, vomiting, and diarrhea.  No specific localized pain anywhere.  Otherwise denies cough, congestion, chest pain, shortness of breath, dysuria.  Has not taken any medication at home for similar symptoms.  Chart review: Patient had splenic artery dilation and median arcuate ligament release on 09/11/2022.  Plan to undergo celiac artery stenting on 08/07/2023 although this was delayed due to cardiology clearance.  Current plan now is for celiac artery stent placement on 11/26/2023.     Physical Exam   Triage Vital Signs: ED Triage Vitals  Encounter Vitals Group     BP 10/28/23 1811 118/66     Systolic BP Percentile --      Diastolic BP Percentile --      Pulse Rate 10/28/23 1811 74     Resp 10/28/23 1811 19     Temp 10/28/23 1811 98.3 F (36.8 C)     Temp Source 10/28/23 1811 Oral     SpO2 10/28/23 1811 100 %     Weight --      Height --      Head Circumference --      Peak Flow --      Pain Score 10/28/23 1814 8     Pain Loc --      Pain Education --      Exclude from Growth Chart --     Most recent vital signs: Vitals:   10/28/23 1933 10/28/23 2100  BP: 135/85 (!) 130/55  Pulse: 68 70  Resp: 18 16  Temp:    SpO2: 100% 97%   Physical Exam: I have reviewed the vital signs and nursing notes. General: Awake, alert, no acute distress.  Nontoxic appearing. Head:  Atraumatic, normocephalic.   ENT:  EOM intact, PERRL. Oral mucosa is pink and moist with no lesions. Neck: Neck is supple with full range of motion, No meningeal signs. Cardiovascular:   RRR, No murmurs. Peripheral pulses palpable and equal bilaterally. Respiratory:  Symmetrical chest wall expansion.  No rhonchi, rales, or wheezes.  Good air movement throughout.  No use of accessory muscles.   Musculoskeletal:  No cyanosis or edema. Moving extremities with full ROM Abdomen:  Soft, generalized tenderness to palpation throughout abdomen, nondistended. Neuro:  GCS 15, moving all four extremities, interacting appropriately. Speech clear. Psych:  Calm, appropriate.   Skin:  Warm, dry, no rash.    ED Results / Procedures / Treatments   Labs (all labs ordered are listed, but only abnormal results are displayed) Labs Reviewed  COMPREHENSIVE METABOLIC PANEL WITH GFR - Abnormal; Notable for the following components:      Result Value   Glucose, Bld 102 (*)    Albumin 5.1 (*)    Total Bilirubin 1.6 (*)    All other components within normal limits  CBC - Abnormal; Notable for the following components:   Hemoglobin 10.3 (*)    HCT 33.6 (*)    MCV 72.1 (*)    MCH 22.1 (*)    RDW 18.9 (*)    nRBC 0.9 (*)    All other  components within normal limits  URINALYSIS, ROUTINE W REFLEX MICROSCOPIC - Abnormal; Notable for the following components:   Color, Urine YELLOW (*)    APPearance CLEAR (*)    Specific Gravity, Urine >1.046 (*)    Ketones, ur 5 (*)    All other components within normal limits  LIPASE, BLOOD     EKG    RADIOLOGY Independently interpreted CT imaging with 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 but no occlusion or rupture   PROCEDURES:  Critical Care performed: No  Procedures   MEDICATIONS ORDERED IN ED: Medications  morphine  (PF) 4 MG/ML injection 4 mg (has no administration in time range)  ondansetron  (ZOFRAN ) injection 4 mg (has no administration in time range)  lactated ringers  bolus 1,000 mL (0 mLs Intravenous Stopped 10/28/23 2105)  ondansetron  (ZOFRAN ) injection 4 mg (4 mg Intravenous Given 10/28/23 1931)  morphine  (PF) 4 MG/ML  injection 4 mg (4 mg Intravenous Given 10/28/23 1933)  iohexol  (OMNIPAQUE ) 350 MG/ML injection 75 mL (75 mLs Intravenous Contrast Given 10/28/23 1948)     IMPRESSION / MDM / ASSESSMENT AND PLAN / ED COURSE  I reviewed the triage vital signs and the nursing notes.                              Differential diagnosis includes, but is not limited to, postoperative complication, enteritis, colitis, pancreatitis, SBO, median arcuate ligament syndrome, worsening of celiac artery aneurysm  Patient's presentation is most consistent with acute complicated illness / injury requiring diagnostic workup.  Patient is a 68 year old female presenting today for generalized abdominal pain associated with nausea, vomiting, and diarrhea.  Tender throughout the abdomen on palpation with no localized spot.  Vital signs are stable.  No other acute findings on exam or history.  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  Will get CT angio of the abdomen/pelvis to evaluate these previous findings for any worsening versus other acute intra-abdominal pathology.  CBC, CMP, lipase largely reassuring.  Will give 1 L LR, morphine , and Zofran .  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  Discussed the case with Dr. Prescilla Brod with vascular surgery on-call.  He says this is a nonemergent finding and does not require immediate intervention for this as she already has intervention planned 1 month from now.  It should not be causing her significant pain or nausea/vomiting/diarrhea.  She can follow-up with her vascular surgery team outpatient and he will call her primary vascular surgeon tomorrow as well.  Reassessed patient and she is feeling much better at this time.  She would like to go home given no significant ongoing pain or vomiting and follow-up  outpatient with her vascular surgery team.  Otherwise stable for discharge and given strict return precautions.  Sent home with as needed oxycodone  and Zofran .  The patient is on the cardiac monitor to evaluate for evidence of arrhythmia and/or significant heart rate changes. 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 This is partially thrombosed and some flow within the aneurysm sac. [DW]  2135 Vascular - this can cause pain but should not cause nausea, vomiting, diarrhea. No immediate therapy needed. Can  follow up with vascular surgery team at [facility]. [DW]  2201 Patient reassessed and feeling much better at this time.  She states she would like to go home at this time with no other emergent intervention.  Vascular surgery will call her team at [facility] tomorrow for outpatient follow-up.  She was given strict return precautions. [DW]    Clinical Course User Index [DW] Kandee Orion, MD     FINAL CLINICAL IMPRESSION(S) / ED DIAGNOSES   Final diagnoses:  Nausea vomiting and diarrhea  Celiac artery aneurysm (HCC)  Generalized abdominal pain     Rx / DC Orders   ED Discharge Orders          Ordered    oxyCODONE  (ROXICODONE ) 5 MG immediate release tablet  Every 8 hours PRN        10/28/23 2203    ondansetron  (ZOFRAN ) 4 MG tablet  Every 8 hours PRN        10/28/23 2203             Note:  This document was prepared using Dragon voice recognition software and may include unintentional dictation errors.   Kandee Orion, MD 10/28/23 [phone]

## 2023-10-28 NOTE — Discharge Instructions (Signed)
 I have sent pain medicine and nausea medicine to your pharmacy to use as needed.  Please follow-up with your vascular surgery team outpatient.  Please return for any severe worsening symptoms.

## 2023-10-29 ENCOUNTER — Telehealth: Payer: Self-pay

## 2023-10-29 ENCOUNTER — Telehealth: Payer: Self-pay | Admitting: Pulmonary Disease

## 2023-10-29 NOTE — Telephone Encounter (Signed)
 Copied from CRM [id number]. Topic: General - Other >> Oct 29, 2023 11:33 AM Howard Macho wrote: Reason for CRM: patient called stating she went to the emergency room as the cma told her to do last night and she was given medication for the nausea and it did not help. Patient stated they did a ct scan on her stomach and nothing has gotten any better. Patient stated she can not keep water down, still has diarrhea and she does not know what to do. Patient stated this has been going on for two years CB [phone]

## 2023-10-29 NOTE — Telephone Encounter (Signed)
 Patient needs PFT scheduled prior to 11/21/2023.

## 2023-10-30 DIAGNOSIS — R1013 Epigastric pain: Secondary | ICD-10-CM | POA: Diagnosis not present

## 2023-10-30 DIAGNOSIS — J432 Centrilobular emphysema: Secondary | ICD-10-CM | POA: Diagnosis not present

## 2023-10-30 DIAGNOSIS — I2510 Atherosclerotic heart disease of native coronary artery without angina pectoris: Secondary | ICD-10-CM | POA: Diagnosis not present

## 2023-10-30 DIAGNOSIS — G4733 Obstructive sleep apnea (adult) (pediatric): Secondary | ICD-10-CM | POA: Diagnosis not present

## 2023-10-30 DIAGNOSIS — I129 Hypertensive chronic kidney disease with stage 1 through stage 4 chronic kidney disease, or unspecified chronic kidney disease: Secondary | ICD-10-CM | POA: Diagnosis not present

## 2023-10-30 DIAGNOSIS — F419 Anxiety disorder, unspecified: Secondary | ICD-10-CM | POA: Diagnosis not present

## 2023-10-30 DIAGNOSIS — Z882 Allergy status to sulfonamides status: Secondary | ICD-10-CM | POA: Diagnosis not present

## 2023-10-30 DIAGNOSIS — N390 Urinary tract infection, site not specified: Secondary | ICD-10-CM | POA: Diagnosis not present

## 2023-10-30 DIAGNOSIS — Z90710 Acquired absence of both cervix and uterus: Secondary | ICD-10-CM | POA: Diagnosis not present

## 2023-10-30 DIAGNOSIS — E785 Hyperlipidemia, unspecified: Secondary | ICD-10-CM | POA: Diagnosis not present

## 2023-10-30 DIAGNOSIS — F17210 Nicotine dependence, cigarettes, uncomplicated: Secondary | ICD-10-CM | POA: Diagnosis not present

## 2023-10-30 DIAGNOSIS — Z880 Allergy status to penicillin: Secondary | ICD-10-CM | POA: Diagnosis not present

## 2023-10-30 DIAGNOSIS — I728 Aneurysm of other specified arteries: Secondary | ICD-10-CM | POA: Diagnosis not present

## 2023-10-30 DIAGNOSIS — N189 Chronic kidney disease, unspecified: Secondary | ICD-10-CM | POA: Diagnosis not present

## 2023-10-30 DIAGNOSIS — R109 Unspecified abdominal pain: Secondary | ICD-10-CM | POA: Diagnosis not present

## 2023-10-30 DIAGNOSIS — J449 Chronic obstructive pulmonary disease, unspecified: Secondary | ICD-10-CM | POA: Diagnosis not present

## 2023-10-30 DIAGNOSIS — R112 Nausea with vomiting, unspecified: Secondary | ICD-10-CM | POA: Diagnosis not present

## 2023-10-30 DIAGNOSIS — N3000 Acute cystitis without hematuria: Secondary | ICD-10-CM | POA: Diagnosis not present

## 2023-10-30 DIAGNOSIS — R197 Diarrhea, unspecified: Secondary | ICD-10-CM | POA: Diagnosis not present

## 2023-10-30 NOTE — Telephone Encounter (Signed)
 LMTCB. Per care everywhere pt has reached out to the vascular surgeon and they spoke with her today. They stated that they would call pt back tomorrow to check on her.

## 2023-11-04 ENCOUNTER — Other Ambulatory Visit: Payer: Self-pay

## 2023-11-04 DIAGNOSIS — R0602 Shortness of breath: Secondary | ICD-10-CM

## 2023-11-05 ENCOUNTER — Ambulatory Visit: Admitting: Pulmonary Disease

## 2023-11-05 DIAGNOSIS — R0602 Shortness of breath: Secondary | ICD-10-CM | POA: Diagnosis not present

## 2023-11-05 DIAGNOSIS — J449 Chronic obstructive pulmonary disease, unspecified: Secondary | ICD-10-CM

## 2023-11-05 LAB — PULMONARY FUNCTION TEST
DL/VA % pred: 82 %
DL/VA: 3.47 ml/min/mmHg/L
DLCO unc % pred: 73 %
DLCO unc: 13.85 ml/min/mmHg
FEF 25-75 Post: 3.16 L/s
FEF 25-75 Pre: 2.39 L/s
FEF2575-%Change-Post: 31 %
FEF2575-%Pred-Post: 163 %
FEF2575-%Pred-Pre: 123 %
FEV1-%Change-Post: 9 %
FEV1-%Pred-Post: 115 %
FEV1-%Pred-Pre: 106 %
FEV1-Post: 2.59 L
FEV1-Pre: 2.37 L
FEV1FVC-%Change-Post: -3 %
FEV1FVC-%Pred-Pre: 105 %
FEV6-%Change-Post: 12 %
FEV6-%Pred-Post: 117 %
FEV6-%Pred-Pre: 104 %
FEV6-Post: 3.3 L
FEV6-Pre: 2.94 L
FEV6FVC-%Change-Post: 0 %
FEV6FVC-%Pred-Post: 103 %
FEV6FVC-%Pred-Pre: 104 %
FVC-%Change-Post: 12 %
FVC-%Pred-Post: 112 %
FVC-%Pred-Pre: 100 %
FVC-Post: 3.32 L
FVC-Pre: 2.95 L
Post FEV1/FVC ratio: 78 %
Post FEV6/FVC ratio: 99 %
Pre FEV1/FVC ratio: 80 %
Pre FEV6/FVC Ratio: 100 %
RV % pred: 92 %
RV: 1.94 L
TLC % pred: 105 %
TLC: 5.2 L

## 2023-11-05 NOTE — Patient Instructions (Signed)
 Full PFT completed today ? ?

## 2023-11-05 NOTE — Progress Notes (Signed)
 Full PFT completed today ? ?

## 2023-11-06 ENCOUNTER — Ambulatory Visit
Admission: RE | Admit: 2023-11-06 | Discharge: 2023-11-06 | Disposition: A | Discharge status: Home / Self Care | Source: Ambulatory Visit | Attending: Internal Medicine | Admitting: Internal Medicine

## 2023-11-06 DIAGNOSIS — Z1231 Encounter for screening mammogram for malignant neoplasm of breast: Secondary | ICD-10-CM | POA: Diagnosis not present

## 2023-11-12 ENCOUNTER — Ambulatory Visit (INDEPENDENT_AMBULATORY_CARE_PROVIDER_SITE_OTHER): Admitting: Internal Medicine

## 2023-11-12 ENCOUNTER — Encounter: Payer: Self-pay | Admitting: Internal Medicine

## 2023-11-12 VITALS — BP 128/56 | HR 101 | Ht 63.0 in | Wt 130.0 lb

## 2023-11-12 DIAGNOSIS — R748 Abnormal levels of other serum enzymes: Secondary | ICD-10-CM

## 2023-11-12 DIAGNOSIS — R112 Nausea with vomiting, unspecified: Secondary | ICD-10-CM

## 2023-11-12 DIAGNOSIS — R252 Cramp and spasm: Secondary | ICD-10-CM

## 2023-11-12 DIAGNOSIS — K3184 Gastroparesis: Secondary | ICD-10-CM

## 2023-11-12 DIAGNOSIS — M48061 Spinal stenosis, lumbar region without neurogenic claudication: Secondary | ICD-10-CM

## 2023-11-12 DIAGNOSIS — R1033 Periumbilical pain: Secondary | ICD-10-CM | POA: Diagnosis not present

## 2023-11-12 DIAGNOSIS — K529 Noninfective gastroenteritis and colitis, unspecified: Secondary | ICD-10-CM

## 2023-11-12 MED ORDER — ONDANSETRON HCL 4 MG PO TABS: 4.0000 mg | ORAL_TABLET | Freq: Three times a day (TID) | ORAL | 2 refills | Status: AC | PRN

## 2023-11-12 MED ORDER — TIZANIDINE HCL 2 MG PO TABS: 2.0000 mg | ORAL_TABLET | Freq: Every day | ORAL | 1 refills | Status: DC

## 2023-11-12 MED ORDER — MEGESTROL ACETATE 20 MG PO TABS: 20.0000 mg | ORAL_TABLET | Freq: Every day | ORAL | 0 refills | Status: DC

## 2023-11-12 MED ORDER — HYDROCODONE-ACETAMINOPHEN 5-325 MG PO TABS: 1.0000 | ORAL_TABLET | Freq: Two times a day (BID) | ORAL | 0 refills | Status: DC | PRN

## 2023-11-12 NOTE — Progress Notes (Addendum)
 Subjective:  Patient ID: Cheryl [name], female    DOB: 12/01/1955  Age: 67 y.o. MRN: [id number]  CC: The primary encounter diagnosis was Chronic diarrhea. Diagnoses of Muscle cramping, Gastroparesis, Elevated liver enzymes, Periumbilical abdominal pain, Degenerative lumbar spinal stenosis, and Nausea and vomiting in adult patient were also pertinent to this visit.   HPI Cheryl [name] presents for ER Follow up    1) Recent episode of N/V in the setting of UTI: brief [facility] admission to rule out bowel infarction .  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  See below :  History celiac artery pseudoaneurysm, prior MALS release.   planned for celiac artery stent placement on 11/26/23. She presented to the ED on June 4  with worsening epigastric pain, vomiting and diarrhea as well as fevers and chills. She was hemodynamically stable on room air with labs significant for a UTI. She was admitted to the [facility] team and re-evaluate with imaging to discuss further surgical/management options. 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   Given lack of improvement following MALS release, diarrhea and symptoms not worsening with food intake, her symptoms are likely unrelated to this celiac pseudoaneurysm. Given risk of UTI progressing to bacteremia there is a high risk of stent infection until this resolves. Still planning to proceed with celiac stenting outpatient as above to prevent progression of the pseudoaneurysm, once UTI is resolved. Discussed risks and benefits of surgical intervention and that her symptoms are most likely not vascular in origin. CT endo showed a stable pseudoaneurysm of the celiac trunk. Patient understood that her symptoms were most likely caused by functional GI problems that were exacerbated in the  setting of an acute urinary tract infection and that vascular interventions were unlikely to help relieve these symptoms.   On hospital day 3 patient was discharged home. Her nausea and vomiting were much improved but diarrhea remained unchanged. She was able to take in liquids and soft solid foods without vomiting. She remained hemodynamically stable without change in status during her entire hospitalization. She was discharged home on a 7 day course of cephalexin to be completed on 6/10.  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  but has not been evaluated for chronic nausea and vomiting . She states that gastroparesis was diagnosed  February  2024 with a gastric emptying study  which was done during her [facility] hospitalization.   (Results are in EPIC via portal  and note borderline values)/   she does not want to return to  [facility].  She has been unable to form a solid stool for the past year , but no sign of crohn's or colitis was seen on  last colonoscopy August  2024 .  She was advised at dc to use Imodium which has not helped.  She avoids sugar free sodas and does not chew sugar free gym . She has nt tried an elimination diet.    Since discharge her oral intake has improved and she has been  able to eat bland solid foods in small amounts.     She continues to require use of zofran  twice daily and megace  for appetite stimulation.  She has gained 4 lbs since Oct 2024 AND Weight is relatively stable.    2) new onset dffuse muscle cramping involving feet,  legs,  thighs, and hands   worse for the past week.    3) chronic  pain   Refill history confirmed via [place] Controlled Substance databas, accessed by me today.SABRA SPLINTER FILLED AN R X FOR OXYCODONE   5mg   ON JUNE 3 prescribed by Dr Malvina , qty #10. Last refill by me May 22 Hydrocodone     Outpatient Medications Prior to Visit   Medication Sig Dispense Refill   Albuterol -Budesonide (AIRSUPRA ) 90-80 MCG/ACT AERO Inhale 1 puff into the lungs every 6 (six) hours as needed. 5.9 g 11   Calcium  Carbonate-Vit D-Min (CALCIUM  1200 PO) Take 1,200 mg by mouth daily.     carvedilol  (COREG ) 6.25 MG tablet Take 1 tablet (6.25 mg total) by mouth 2 (two) times daily with a meal. 180 tablet 3   Cholecalciferol (VITAMIN D ) 125 MCG (5000 UT) CAPS Take 5,000 Units by mouth daily.     folic acid  (FOLVITE ) 1 MG tablet Take 1 tablet (1 mg total) by mouth daily. 90 tablet 3   furosemide  (LASIX ) 20 MG tablet Take 1 tablet (20 mg total) by mouth daily as needed. 30 tablet 3   gabapentin  (NEURONTIN ) 300 MG capsule Take 1 capsule (300 mg total) by mouth 3 (three) times daily. 270 capsule 1   nitroGLYCERIN  (NITROSTAT ) 0.4 MG SL tablet Place 1 tablet (0.4 mg total) under the tongue every 5 (five) minutes as needed for chest pain. DISSOLVE 1 TABLET UNDER THE TONGUE EVERY 5 MINUTES AS  NEEDED FOR CHEST PAIN. MAX  OF 3 TABLETS IN 15 MINUTES. CALL 911 IF PAIN PERSISTS. 100 tablet 3   omeprazole  (PRILOSEC) 40 MG capsule Take 1 capsule (40 mg total) by mouth daily. 90 capsule 1   Prasterone, DHEA, 10 MG CAPS Take 10 mg by mouth daily.     progesterone  (PROMETRIUM ) 200 MG capsule Take 200 mg by mouth at bedtime.     vitamin C  (ASCORBIC ACID ) 500 MG tablet Take 500 mg by mouth daily.     amitriptyline  (ELAVIL ) 50 MG tablet TAKE 1 TABLET BY MOUTH AT BEDTIME 90 tablet 0   buPROPion  (WELLBUTRIN ) 100 MG tablet Take 1.5 tablets (150 mg total) by mouth 2 (two) times daily. 90 tablet 1   HYDROcodone -acetaminophen  (NORCO/VICODIN) 5-325 MG tablet Take 1 tablet by mouth 2 (two) times daily as needed for moderate pain (pain score 4-6). 60 tablet 0   megestrol  (MEGACE ) 20 MG tablet Take 1 tablet by mouth daily.     ondansetron  (ZOFRAN ) 4 MG tablet Take 1 tablet (4 mg total) by mouth every 8 (eight) hours as needed. 15 tablet 0   ondansetron  (ZOFRAN ) 8 MG tablet Take 1  tablet (8 mg total) by mouth every 8 (eight) hours as needed for nausea or vomiting. 60 tablet 1   oxyCODONE  (ROXICODONE ) 5 MG immediate release tablet Take 1 tablet (5 mg total) by mouth every 8 (eight) hours as needed for severe pain (pain score 7-10) or breakthrough pain. 10 tablet 0   rOPINIRole  (REQUIP ) 1 MG tablet TAKE 1 TABLET BY MOUTH THREE TIMES DAILY 90 tablet 0   rosuvastatin  (CRESTOR ) 10 MG tablet Take 1 tablet (10 mg total) by mouth daily. 90 tablet 3   traMADol  (ULTRAM ) 50 MG tablet Take 1 tablet by mouth twice daily as needed for pain 60 tablet 2   venlafaxine  XR (EFFEXOR -XR) 75  MG 24 hr capsule Take 75 mg by mouth daily.     Testosterone  100 MG PLLT See admin instructions.     Testosterone  37.5 MG PLLT See admin instructions.     No facility-administered medications prior to visit.    Review of Systems;  Patient denies headache, fevers, malaise, unintentional weight loss, skin rash, eye pain, sinus congestion and sinus pain, sore throat, dysphagia,  hemoptysis , cough, dyspnea, wheezing, chest pain, palpitations, orthopnea, edema, abdominal pain, nausea, melena, diarrhea, constipation, flank pain, dysuria, hematuria, urinary  Frequency, nocturia, numbness, tingling, seizures,  Focal weakness, Loss of consciousness,  Tremor, insomnia, depression, anxiety, and suicidal ideation.      Objective:  BP (!) 128/56   Pulse (!) 101   Ht 5' 3 (1.6 m)   Wt 130 lb (59 kg)   SpO2 97%   BMI 23.03 kg/m   BP Readings from Last 3 Encounters:  02/18/24 128/68  11/12/23 (!) 128/56  10/28/23 131/61    Wt Readings from Last 3 Encounters:  02/18/24 134 lb 12.8 oz (61.1 kg)  11/12/23 130 lb (59 kg)  11/05/23 129 lb 6.4 oz (58.7 kg)    Physical Exam Vitals reviewed.  Constitutional:      General: She is not in acute distress.    Appearance: Normal appearance. She is normal weight. She is not ill-appearing, toxic-appearing or diaphoretic.  HENT:     Head: Normocephalic.  Eyes:      General: No scleral icterus.       Right eye: No discharge.        Left eye: No discharge.     Conjunctiva/sclera: Conjunctivae normal.  Cardiovascular:     Rate and Rhythm: Normal rate and regular rhythm.     Heart sounds: Normal heart sounds.  Pulmonary:     Effort: Pulmonary effort is normal. No respiratory distress.     Breath sounds: Normal breath sounds.  Abdominal:     General: Bowel sounds are normal. There is no distension.     Tenderness: There is abdominal tenderness. There is no guarding.  Musculoskeletal:        General: Normal range of motion.  Skin:    General: Skin is warm and dry.  Neurological:     General: No focal deficit present.     Mental Status: She is alert and oriented to person, place, and time. Mental status is at baseline.  Psychiatric:        Mood and Affect: Mood normal.        Behavior: Behavior normal.        Thought Content: Thought content normal.        Judgment: Judgment normal.     Lab Results  Component Value Date   HGBA1C 5.5 05/02/2022   HGBA1C 5.1 10/26/2015   HGBA1C 5.4 11/25/2013    Lab Results  Component Value Date   CREATININE 0.97 11/12/2023   CREATININE 0.77 10/28/2023   CREATININE 0.73 10/24/2023    Lab Results  Component Value Date   WBC 8.5 10/28/2023   HGB 10.3 (L) 10/28/2023   HCT 33.6 (L) 10/28/2023   PLT 265 10/28/2023   GLUCOSE 81 11/12/2023   CHOL 89 11/12/2022   TRIG 83.0 11/12/2022   HDL 31.60 (L) 11/12/2022   LDLDIRECT 58.0 05/02/2022   LDLCALC 41 11/12/2022   ALT 17 11/12/2023   AST 19 11/12/2023   NA 137 11/12/2023   K 5.0 11/12/2023   CL 107 11/12/2023   CREATININE 0.97  11/12/2023   BUN 15 11/12/2023   CO2 24 11/12/2023   TSH 0.76 11/12/2022   INR 1.1 07/17/2022   HGBA1C 5.5 05/02/2022    MM 3D SCREENING MAMMOGRAM BILATERAL BREAST Result Date: 11/07/2023 CLINICAL DATA:  Screening. EXAM: DIGITAL SCREENING BILATERAL MAMMOGRAM WITH TOMOSYNTHESIS AND CAD TECHNIQUE: Bilateral screening  digital craniocaudal and mediolateral oblique mammograms were obtained. Bilateral screening digital breast tomosynthesis was performed. The images were evaluated with computer-aided detection. COMPARISON:  Previous exam(s). ACR Breast Density Category b: There are scattered areas of fibroglandular density. FINDINGS: There are no findings suspicious for malignancy. IMPRESSION: No mammographic evidence of malignancy. A result letter of this screening mammogram will be mailed directly to the patient. RECOMMENDATION: Screening mammogram in one year. (Code:SM-B-01Y) BI-RADS CATEGORY  1: Negative. Electronically Signed   By: Craig Farr M.D.   On: 11/07/2023 16:04    Assessment & Plan:  .Chronic diarrhea Assessment & Plan: Etiology likely functional / IBS given normal diagnostic colonoscopy August 2024 .  Referring to [facility] for second opinion given patient's preference to avoid [facility]   Orders: -     Sedimentation rate  Muscle cramping -     Magnesium -     Basic metabolic panel with GFR -     Sedimentation rate  Gastroparesis -     NM GASTRIC EMPTYING; Future -     Ambulatory referral to Gastroenterology  Elevated liver enzymes -     Hepatic function panel  Periumbilical abdominal pain -     Hepatic function panel -     Lipase  Degenerative lumbar spinal stenosis Assessment & Plan: Secondary to lumbar spinal stenosis.  Her pain requires use of chronic opiate therapy; NSAIDS are contraindicated due to CKD .  Refill history confirmed via [place] Controlled Substance databas, accessed by me today... refills given for 3 months    Nausea and vomiting in adult patient Assessment & Plan: Reviewed recent [facility] admission including labs , spiral CT's and d/c summary.  No etiology was found.  She has a history of borderline delayed gastric emptying  by [facility] Gastric emptying study done in Feb 2024 during an admission.  Will repeat study ,  refill zofran  ,  advise her to try an elimination diet,  and  refer to [facility] for their opinion    Other orders -     tiZANidine  HCl; Take 1 tablet (2 mg total) by mouth at bedtime.  Dispense: 90 tablet; Refill: 1 -     Ondansetron  HCl; Take 1 tablet (4 mg total) by mouth every 8 (eight) hours as needed.  Dispense: 60 tablet; Refill: 2 -     HYDROcodone -Acetaminophen ; Take 1 tablet by mouth 2 (two) times daily as needed for moderate pain (pain score 4-6).  Dispense: 60 tablet; Refill: 0 -     HYDROcodone -Acetaminophen ; Take 1 tablet by mouth 2 (two) times daily as needed for moderate pain (pain score 4-6).  Dispense: 60 tablet; Refill: 0 -     HYDROcodone -Acetaminophen ; Take 1 tablet by mouth 2 (two) times daily as needed for moderate pain (pain score 4-6).  Dispense: 60 tablet; Refill: 0     I spent 48 minutes on the day of this face to face encounter reviewing patient's  most recent hospitalization at [facility]  her prior hospitalizaion in Feb 2024,  August 2024 colonoscopy , prior relevant surgical and non surgical procedures, recent  labs and imaging studies, counseling on  diarrhea  management,  reviewing the assessment  and plan with patient, and post visit ordering and reviewing of  diagnostics and therapeutics with patient  .   Follow-up: Return in about 3 months (around 02/12/2024) for chronic pain management.   Verneita LITTIE Kettering, MD

## 2023-11-12 NOTE — Patient Instructions (Addendum)
 Referral for gastric emptying study and new GI referral to [place] in progress   You need 60 ounces of water  or electrolyte replacement drink daily to maintain hydration  Do not use sugar free drinks or gum  .Aaron Aas  Worsens diarrhea  Try adding  benefiber with prebiotics to hlep bulk up your stools  .  You can take it twice daily if it helps   Adding tizanidine  at lowest dose to take at bedtime to prevent muscle cramps

## 2023-11-13 DIAGNOSIS — K529 Noninfective gastroenteritis and colitis, unspecified: Secondary | ICD-10-CM | POA: Insufficient documentation

## 2023-11-13 LAB — BASIC METABOLIC PANEL WITH GFR
BUN: 15 mg/dL (ref 6–23)
CO2: 24 meq/L (ref 19–32)
Calcium: 8.7 mg/dL (ref 8.4–10.5)
Chloride: 107 meq/L (ref 96–112)
Creatinine, Ser: 0.97 mg/dL (ref 0.40–1.20)
GFR: 60.1 mL/min (ref >60.00)
Glucose, Bld: 81 mg/dL (ref 70–99)
Potassium: 5 meq/L (ref 3.5–5.1)
Sodium: 137 meq/L (ref 135–145)

## 2023-11-13 LAB — LIPASE: Lipase: 25 U/L (ref 11.0–59.0)

## 2023-11-13 LAB — HEPATIC FUNCTION PANEL
ALT: 17 U/L (ref 0–35)
AST: 19 U/L (ref 0–37)
Albumin: 4.4 g/dL (ref 3.5–5.2)
Alkaline Phosphatase: 70 U/L (ref 39–117)
Bilirubin, Direct: 0.2 mg/dL (ref 0.0–0.3)
Total Bilirubin: 1.1 mg/dL (ref 0.2–1.2)
Total Protein: 6.3 g/dL (ref 6.0–8.3)

## 2023-11-13 LAB — SEDIMENTATION RATE: Sed Rate: 14 mm/h (ref 0–30)

## 2023-11-13 LAB — MAGNESIUM: Magnesium: 2.1 mg/dL (ref 1.5–2.5)

## 2023-11-13 MED ORDER — HYDROCODONE-ACETAMINOPHEN 5-325 MG PO TABS: 1.0000 | ORAL_TABLET | Freq: Two times a day (BID) | ORAL | 0 refills | Status: DC | PRN

## 2023-11-13 NOTE — Assessment & Plan Note (Signed)
 Etiology likely functional / IBS given normal diagnostic colonoscopy August 2024 .  Referring to [facility] for second opinion given patient's preference to avoid [facility]

## 2023-11-13 NOTE — Assessment & Plan Note (Signed)
 Reviewed recent [facility] admission including labs , spiral CT's and d/c summary.  No etiology was found.  She has a history of borderline delayed gastric emptying  by [facility] Gastric emptying study done in Feb 2024 during an admission.  Will repeat study ,  refill zofran  ,  advise her to try an elimination diet,  and refer to [facility] for their opinion

## 2023-11-13 NOTE — Assessment & Plan Note (Signed)
 Secondary to lumbar spinal stenosis.  Her pain requires use of chronic opiate therapy; NSAIDS are contraindicated due to CKD .  Refill history confirmed via [place] Controlled Substance databas, accessed by me today... refills given for 3 months

## 2023-11-14 ENCOUNTER — Ambulatory Visit: Payer: Self-pay | Admitting: Internal Medicine

## 2023-11-16 ENCOUNTER — Other Ambulatory Visit: Payer: Self-pay | Admitting: Internal Medicine

## 2023-11-18 DIAGNOSIS — D649 Anemia, unspecified: Secondary | ICD-10-CM | POA: Diagnosis not present

## 2023-11-18 DIAGNOSIS — J449 Chronic obstructive pulmonary disease, unspecified: Secondary | ICD-10-CM | POA: Diagnosis not present

## 2023-11-18 DIAGNOSIS — I10 Essential (primary) hypertension: Secondary | ICD-10-CM | POA: Diagnosis not present

## 2023-11-21 ENCOUNTER — Ambulatory Visit: Admitting: Pulmonary Disease

## 2023-11-26 DIAGNOSIS — I771 Stricture of artery: Secondary | ICD-10-CM | POA: Diagnosis not present

## 2023-11-26 DIAGNOSIS — N390 Urinary tract infection, site not specified: Secondary | ICD-10-CM | POA: Diagnosis not present

## 2023-11-26 DIAGNOSIS — I708 Atherosclerosis of other arteries: Secondary | ICD-10-CM | POA: Diagnosis not present

## 2023-11-26 DIAGNOSIS — R112 Nausea with vomiting, unspecified: Secondary | ICD-10-CM | POA: Diagnosis not present

## 2023-11-26 DIAGNOSIS — I129 Hypertensive chronic kidney disease with stage 1 through stage 4 chronic kidney disease, or unspecified chronic kidney disease: Secondary | ICD-10-CM | POA: Diagnosis not present

## 2023-11-26 DIAGNOSIS — F17210 Nicotine dependence, cigarettes, uncomplicated: Secondary | ICD-10-CM | POA: Diagnosis not present

## 2023-11-26 DIAGNOSIS — I728 Aneurysm of other specified arteries: Secondary | ICD-10-CM | POA: Diagnosis not present

## 2023-11-26 DIAGNOSIS — Z79899 Other long term (current) drug therapy: Secondary | ICD-10-CM | POA: Diagnosis not present

## 2023-11-26 DIAGNOSIS — Z880 Allergy status to penicillin: Secondary | ICD-10-CM | POA: Diagnosis not present

## 2023-11-26 DIAGNOSIS — E785 Hyperlipidemia, unspecified: Secondary | ICD-10-CM | POA: Diagnosis not present

## 2023-11-26 DIAGNOSIS — N189 Chronic kidney disease, unspecified: Secondary | ICD-10-CM | POA: Diagnosis not present

## 2023-11-26 DIAGNOSIS — R1013 Epigastric pain: Secondary | ICD-10-CM | POA: Diagnosis not present

## 2023-11-26 DIAGNOSIS — Z882 Allergy status to sulfonamides status: Secondary | ICD-10-CM | POA: Diagnosis not present

## 2023-11-26 DIAGNOSIS — G4733 Obstructive sleep apnea (adult) (pediatric): Secondary | ICD-10-CM | POA: Diagnosis not present

## 2023-11-26 DIAGNOSIS — Z90710 Acquired absence of both cervix and uterus: Secondary | ICD-10-CM | POA: Diagnosis not present

## 2023-11-26 DIAGNOSIS — F419 Anxiety disorder, unspecified: Secondary | ICD-10-CM | POA: Diagnosis not present

## 2023-11-26 DIAGNOSIS — J449 Chronic obstructive pulmonary disease, unspecified: Secondary | ICD-10-CM | POA: Diagnosis not present

## 2023-11-27 ENCOUNTER — Encounter: Payer: Self-pay | Admitting: Internal Medicine

## 2023-11-27 DIAGNOSIS — M48061 Spinal stenosis, lumbar region without neurogenic claudication: Secondary | ICD-10-CM

## 2023-11-28 MED ORDER — VENLAFAXINE HCL ER 75 MG PO CP24: 75.0000 mg | ORAL_CAPSULE | Freq: Every day | ORAL | 1 refills | Status: DC

## 2023-11-28 MED ORDER — BUPROPION HCL 100 MG PO TABS: 150.0000 mg | ORAL_TABLET | Freq: Two times a day (BID) | ORAL | 1 refills | Status: DC

## 2023-11-28 NOTE — Telephone Encounter (Signed)
 Refilled: 05/23/2023 Last OV: 11/12/2023 Next OV: 02/18/2024

## 2023-11-28 NOTE — Addendum Note (Signed)
 Addended by: Yash Cacciola on: 11/28/2023 04:02 PM   Modules accepted: Orders

## 2023-11-29 MED ORDER — TRAMADOL HCL 50 MG PO TABS: 50.0000 mg | ORAL_TABLET | Freq: Two times a day (BID) | ORAL | 2 refills | Status: DC | PRN

## 2023-12-04 ENCOUNTER — Encounter

## 2023-12-17 DIAGNOSIS — N951 Menopausal and female climacteric states: Secondary | ICD-10-CM | POA: Diagnosis not present

## 2023-12-24 ENCOUNTER — Other Ambulatory Visit: Payer: Self-pay | Admitting: Internal Medicine

## 2024-01-01 ENCOUNTER — Ambulatory Visit

## 2024-01-01 ENCOUNTER — Other Ambulatory Visit: Payer: Self-pay | Admitting: Internal Medicine

## 2024-01-10 ENCOUNTER — Other Ambulatory Visit: Payer: PPO

## 2024-01-10 ENCOUNTER — Other Ambulatory Visit: Payer: Self-pay | Admitting: Internal Medicine

## 2024-01-16 DIAGNOSIS — R109 Unspecified abdominal pain: Secondary | ICD-10-CM | POA: Diagnosis not present

## 2024-01-16 DIAGNOSIS — E785 Hyperlipidemia, unspecified: Secondary | ICD-10-CM | POA: Diagnosis not present

## 2024-01-16 DIAGNOSIS — I774 Celiac artery compression syndrome: Secondary | ICD-10-CM | POA: Diagnosis not present

## 2024-01-16 DIAGNOSIS — K3184 Gastroparesis: Secondary | ICD-10-CM | POA: Diagnosis not present

## 2024-01-16 DIAGNOSIS — F17210 Nicotine dependence, cigarettes, uncomplicated: Secondary | ICD-10-CM | POA: Diagnosis not present

## 2024-01-16 DIAGNOSIS — Z95828 Presence of other vascular implants and grafts: Secondary | ICD-10-CM | POA: Diagnosis not present

## 2024-01-17 ENCOUNTER — Ambulatory Visit: Payer: PPO | Admitting: Internal Medicine

## 2024-01-17 ENCOUNTER — Ambulatory Visit: Payer: PPO

## 2024-01-30 ENCOUNTER — Encounter: Payer: Self-pay | Admitting: Internal Medicine

## 2024-01-31 ENCOUNTER — Encounter: Admission: RE | Admit: 2024-01-31 | Source: Ambulatory Visit

## 2024-02-09 ENCOUNTER — Other Ambulatory Visit: Payer: Self-pay | Admitting: Internal Medicine

## 2024-02-18 ENCOUNTER — Encounter: Payer: Self-pay | Admitting: Internal Medicine

## 2024-02-18 ENCOUNTER — Telehealth: Payer: Self-pay | Admitting: Internal Medicine

## 2024-02-18 ENCOUNTER — Ambulatory Visit (INDEPENDENT_AMBULATORY_CARE_PROVIDER_SITE_OTHER): Admitting: Internal Medicine

## 2024-02-18 VITALS — BP 128/68 | HR 83 | Ht 63.0 in | Wt 134.8 lb

## 2024-02-18 DIAGNOSIS — R112 Nausea with vomiting, unspecified: Secondary | ICD-10-CM

## 2024-02-18 DIAGNOSIS — K638219 Small intestinal bacterial overgrowth, unspecified: Secondary | ICD-10-CM

## 2024-02-18 DIAGNOSIS — G8929 Other chronic pain: Secondary | ICD-10-CM

## 2024-02-18 DIAGNOSIS — D508 Other iron deficiency anemias: Secondary | ICD-10-CM

## 2024-02-18 DIAGNOSIS — M48061 Spinal stenosis, lumbar region without neurogenic claudication: Secondary | ICD-10-CM | POA: Diagnosis not present

## 2024-02-18 DIAGNOSIS — I2583 Coronary atherosclerosis due to lipid rich plaque: Secondary | ICD-10-CM

## 2024-02-18 DIAGNOSIS — R7301 Impaired fasting glucose: Secondary | ICD-10-CM | POA: Diagnosis not present

## 2024-02-18 DIAGNOSIS — I774 Celiac artery compression syndrome: Secondary | ICD-10-CM

## 2024-02-18 DIAGNOSIS — E782 Mixed hyperlipidemia: Secondary | ICD-10-CM

## 2024-02-18 DIAGNOSIS — R5383 Other fatigue: Secondary | ICD-10-CM | POA: Diagnosis not present

## 2024-02-18 DIAGNOSIS — D563 Thalassemia minor: Secondary | ICD-10-CM

## 2024-02-18 DIAGNOSIS — I2510 Atherosclerotic heart disease of native coronary artery without angina pectoris: Secondary | ICD-10-CM

## 2024-02-18 DIAGNOSIS — M542 Cervicalgia: Secondary | ICD-10-CM

## 2024-02-18 MED ORDER — OMEPRAZOLE 40 MG PO CPDR: 40.0000 mg | DELAYED_RELEASE_CAPSULE | Freq: Every day | ORAL | 1 refills | Status: AC

## 2024-02-18 MED ORDER — GABAPENTIN 300 MG PO CAPS: 300.0000 mg | ORAL_CAPSULE | Freq: Three times a day (TID) | ORAL | 1 refills | Status: DC

## 2024-02-18 MED ORDER — ROSUVASTATIN CALCIUM 10 MG PO TABS: 10.0000 mg | ORAL_TABLET | Freq: Every day | ORAL | 1 refills | Status: AC

## 2024-02-18 MED ORDER — HYDROCODONE-ACETAMINOPHEN 5-325 MG PO TABS: 1.0000 | ORAL_TABLET | Freq: Two times a day (BID) | ORAL | 0 refills | Status: DC | PRN

## 2024-02-18 MED ORDER — IBGARD 90 MG PO CPCR: 1.0000 | ORAL_CAPSULE | Freq: Every day | ORAL | Status: AC

## 2024-02-18 NOTE — Progress Notes (Signed)
 Subjective:  Patient ID: Cheryl [name], female    DOB: [birth year]  Age: 68 y.o. MRN: [id number]  CC: The primary encounter diagnosis was Lumbar stenosis without neurogenic claudication. Diagnoses of Median arcuate ligament syndrome, Mixed hyperlipidemia, Other iron  deficiency anemia, Impaired fasting glucose, Other fatigue, Small intestinal bacterial overgrowth (SIBO), and Nausea and vomiting in adult patient were also pertinent to this visit.   HPI Cheryl [name] presents for  Chief Complaint  Patient presents with   Medical Management of Chronic Issues    3 month follow up    1) Chronic LB pain managed with Vicodin  2) gastroparesis : empty study was cancelled by patient on the advice of the [place] gastroenterologist's office (per patient, she states that she was called in June and told to cancel it )   but she has not been called with a [facility] appointment .  S he continues to have a 15 day cycle of GI upset which starts witha week of loose stools,  uncontrolled,  several per day,treated with Imodium resulted in 2-3 days of no stool,  accompanied by nausea and vomiting for 48 hours which is resolved by staying NPO for 5-6 days , stools never return to normal.    Prior empiric  treatment for SIBO with rifaxamin  in November DID NOT resolve symptoms   3) loss of appetite,  depression  both improving with megace  and wellbutrin     4) recurrent episodes of dizziness without vertigo accompanied by chest tightness . 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 5.9 g 11   amitriptyline  (ELAVIL ) 50 MG tablet TAKE 1 TABLET BY MOUTH AT BEDTIME 90 tablet 1   buPROPion  (WELLBUTRIN ) 100 MG tablet Take 1.5 tablets (150 mg total) by mouth 2 (two)  times daily. 90 tablet 1   Calcium  Carbonate-Vit D-Min (CALCIUM  1200 PO) Take 1,200 mg by mouth daily.     carvedilol  (COREG ) 6.25 MG tablet Take 1 tablet (6.25 mg total) by mouth 2 (two) times daily with a meal. 180 tablet 3   Cholecalciferol (VITAMIN D ) 125 MCG (5000 UT) CAPS Take 5,000 Units by mouth daily.     clopidogrel (PLAVIX) 75 MG tablet Take 75 mg by mouth daily.     folic acid  (FOLVITE ) 1 MG tablet Take 1 tablet (1 mg total) by mouth daily. 90 tablet 3   furosemide  (LASIX ) 20 MG tablet Take 1 tablet (20 mg total) by mouth daily as needed. 30 tablet 3   megestrol  (MEGACE ) 20 MG tablet Take 1 tablet by mouth once daily 30 tablet 1   nitroGLYCERIN  (NITROSTAT ) 0.4 MG SL tablet Place 1 tablet (0.4 mg total) under the tongue every 5 (five) minutes as needed for chest pain. DISSOLVE 1 TABLET UNDER THE TONGUE EVERY 5 MINUTES AS  NEEDED FOR CHEST PAIN. MAX  OF 3 TABLETS IN 15 MINUTES. CALL 911 IF PAIN PERSISTS. 100 tablet 3   ondansetron  (ZOFRAN ) 4 MG tablet Take 1 tablet (4 mg total) by mouth every 8 (eight) hours as needed. 60 tablet 2   Prasterone, DHEA, 10 MG CAPS Take 10 mg by mouth daily.     progesterone  (PROMETRIUM ) 200 MG capsule Take 200 mg by mouth at bedtime.     rOPINIRole  (REQUIP ) 1  MG tablet TAKE 1 TABLET BY MOUTH THREE TIMES DAILY 90 tablet 1   Testosterone  100 MG PLLT See admin instructions.     Testosterone  37.5 MG PLLT See admin instructions.     tiZANidine  (ZANAFLEX ) 2 MG tablet Take 1 tablet (2 mg total) by mouth at bedtime. 90 tablet 1   traMADol  (ULTRAM ) 50 MG tablet Take 1 tablet (50 mg total) by mouth 2 (two) times daily as needed. for pain 60 tablet 2   venlafaxine  XR (EFFEXOR -XR) 75 MG 24 hr capsule Take 1 capsule (75 mg total) by mouth daily. 90 capsule 1   vitamin C  (ASCORBIC ACID ) 500 MG tablet Take 500 mg by mouth daily.     gabapentin  (NEURONTIN ) 300 MG capsule Take 1 capsule (300 mg total) by mouth 3 (three) times daily. 270 capsule 1    HYDROcodone -acetaminophen  (NORCO/VICODIN) 5-325 MG tablet Take 1 tablet by mouth 2 (two) times daily as needed for moderate pain (pain score 4-6). 60 tablet 0   HYDROcodone -acetaminophen  (NORCO/VICODIN) 5-325 MG tablet Take 1 tablet by mouth 2 (two) times daily as needed for moderate pain (pain score 4-6). 60 tablet 0   HYDROcodone -acetaminophen  (NORCO/VICODIN) 5-325 MG tablet Take 1 tablet by mouth 2 (two) times daily as needed for moderate pain (pain score 4-6). 60 tablet 0   omeprazole  (PRILOSEC) 40 MG capsule Take 1 capsule (40 mg total) by mouth daily. 90 capsule 1   rosuvastatin  (CRESTOR ) 10 MG tablet Take 1 tablet by mouth once daily 90 tablet 0   No facility-administered medications prior to visit.    Review of Systems;  Patient denies headache, fevers, malaise, unintentional weight loss, skin rash, eye pain, sinus congestion and sinus pain, sore throat, dysphagia,  hemoptysis , cough, dyspnea, wheezing, chest pain, palpitations, orthopnea, edema, abdominal pain, nausea, melena, diarrhea, constipation, flank pain, dysuria, hematuria, urinary  Frequency, nocturia, numbness, tingling, seizures,  Focal weakness, Loss of consciousness,  Tremor, insomnia, depression, anxiety, and suicidal ideation.      Objective:  BP 128/68   Pulse 83   Ht 5' 3 (1.6 m)   Wt 134 lb 12.8 oz (61.1 kg)   SpO2 98%   BMI 23.88 kg/m   BP Readings from Last 3 Encounters:  02/18/24 128/68  11/12/23 (!) 128/56  10/28/23 131/61    Wt Readings from Last 3 Encounters:  02/18/24 134 lb 12.8 oz (61.1 kg)  11/12/23 130 lb (59 kg)  11/05/23 129 lb 6.4 oz (58.7 kg)    Physical Exam Vitals reviewed.  Constitutional:      General: She is not in acute distress.    Appearance: Normal appearance. She is normal weight. She is not ill-appearing, toxic-appearing or diaphoretic.  HENT:     Head: Normocephalic.  Eyes:     General: No scleral icterus.       Right eye: No discharge.        Left eye: No  discharge.     Conjunctiva/sclera: Conjunctivae normal.  Cardiovascular:     Rate and Rhythm: Normal rate and regular rhythm.     Heart sounds: Normal heart sounds.  Pulmonary:     Effort: Pulmonary effort is normal. No respiratory distress.     Breath sounds: Normal breath sounds.  Musculoskeletal:        General: Normal range of motion.  Skin:    General: Skin is warm and dry.  Neurological:     General: No focal deficit present.     Mental Status: She is alert  and oriented to person, place, and time. Mental status is at baseline.  Psychiatric:        Mood and Affect: Mood normal.        Behavior: Behavior normal.        Thought Content: Thought content normal.        Judgment: Judgment normal.     Lab Results  Component Value Date   HGBA1C 5.5 05/02/2022   HGBA1C 5.1 10/26/2015   HGBA1C 5.4 11/25/2013    Lab Results  Component Value Date   CREATININE 0.97 11/12/2023   CREATININE 0.77 10/28/2023   CREATININE 0.73 10/24/2023    Lab Results  Component Value Date   WBC 8.5 10/28/2023   HGB 10.3 (L) 10/28/2023   HCT 33.6 (L) 10/28/2023   PLT 265 10/28/2023   GLUCOSE 81 11/12/2023   CHOL 89 11/12/2022   TRIG 83.0 11/12/2022   HDL 31.60 (L) 11/12/2022   LDLDIRECT 58.0 05/02/2022   LDLCALC 41 11/12/2022   ALT 17 11/12/2023   AST 19 11/12/2023   NA 137 11/12/2023   K 5.0 11/12/2023   CL 107 11/12/2023   CREATININE 0.97 11/12/2023   BUN 15 11/12/2023   CO2 24 11/12/2023   TSH 0.76 11/12/2022   INR 1.1 07/17/2022   HGBA1C 5.5 05/02/2022    MM 3D SCREENING MAMMOGRAM BILATERAL BREAST Result Date: 11/07/2023 CLINICAL DATA:  Screening. EXAM: DIGITAL SCREENING BILATERAL MAMMOGRAM WITH TOMOSYNTHESIS AND CAD TECHNIQUE: Bilateral screening digital craniocaudal and mediolateral oblique mammograms were obtained. Bilateral screening digital breast tomosynthesis was performed. The images were evaluated with computer-aided detection. COMPARISON:  Previous exam(s). ACR Breast  Density Category b: There are scattered areas of fibroglandular density. FINDINGS: There are no findings suspicious for malignancy. IMPRESSION: No mammographic evidence of malignancy. A result letter of this screening mammogram will be mailed directly to the patient. RECOMMENDATION: Screening mammogram in one year. (Code:SM-B-01Y) BI-RADS CATEGORY  1: Negative. Electronically Signed   By: Craig Farr M.D.   On: 11/07/2023 16:04    Assessment & Plan:  .Lumbar stenosis without neurogenic claudication Assessment & Plan: Secondary to known spinal stenosis.  She has been advised to avoid NSAIDs .  Refill history confirmed via [place] Controlled Substance databas, accessed by me today.. refills for hydrocodone  given for 3 months    Median arcuate ligament syndrome Assessment & Plan: Seen in August 2025 for follow up  by [facility] Surgeon:  s/p median arcuate ligament release on 09/11/22. 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 Currently, she still has intermittent diarrhea and vomiting after eating, saying that she has good days and bad days. Overall, her epigastric pain has improved significantly since her stent procedure. Current smoker (1 ppd).    Mixed hyperlipidemia -     Lipid panel -     LDL cholesterol, direct  Other iron  deficiency anemia -     CBC with Differential/Platelet  Impaired fasting glucose -     Comprehensive metabolic panel with GFR -     Hemoglobin A1c  Other fatigue -     TSH  Small intestinal bacterial overgrowth (SIBO) Assessment & Plan: Presumed ,  by GI.  Patient was prescribed rifaxamin recently but has had no improvement in her diarrhea or nausea    Nausea and vomiting in adult patient Assessment & Plan: Etiology unclear.   Worse since MALS surgery and celiac artery stenting ,  but not considered  as causative per [facility].  Gastric emptying study was borderline abnormal last  year at [facility]. I reordered it, but patient cancelled it , apparently advised to do so by [facility] , but has yet to have appt with [facility]  scheduled.  Will try adding IBGARD ad benefiber with prebiotics and request that she be scheduled with [facility] ASAP . Thankfully the Megace  is helping her appetite.    Other orders -     HYDROcodone -Acetaminophen ; Take 1 tablet by mouth 2 (two) times daily as needed for moderate pain (pain score 4-6).  Dispense: 60 tablet; Refill: 0 -     HYDROcodone -Acetaminophen ; Take 1 tablet by mouth 2 (two) times daily as needed for moderate pain (pain score 4-6).  Dispense: 60 tablet; Refill: 0 -     HYDROcodone -Acetaminophen ; Take 1 tablet by mouth 2 (two) times daily as needed for moderate pain (pain score 4-6).  Dispense: 60 tablet; Refill: 0 -     Gabapentin ; Take 1 capsule (300 mg total) by mouth 3 (three) times daily.  Dispense: 270 capsule; Refill: 1 -     Omeprazole ; Take 1 capsule (40 mg total) by mouth daily.  Dispense: 90 capsule; Refill: 1 -     Rosuvastatin  Calcium ; Take 1 tablet (10 mg total) by mouth daily.  Dispense: 90 tablet; Refill: 1 -     IBgard; Take 1 capsule by mouth daily.     I spent 34 minutes on the day of this face to face encounter reviewing patient's  most recent visit with cardiology,  nephrology,  and neurology,  prior relevant surgical and non surgical procedures, recent  labs and imaging studies, counseling on weight management,  reviewing the assessment and plan with patient, and post visit ordering and reviewing of  diagnostics and therapeutics with patient  .   Follow-up: No follow-ups on file.   Verneita LITTIE Kettering, MD

## 2024-02-18 NOTE — Assessment & Plan Note (Signed)
 Secondary to known spinal stenosis.  She has been advised to avoid NSAIDs .  Refill history confirmed via [place] Controlled Substance databas, accessed by me today.. refills for hydrocodone  given for 3 months

## 2024-02-18 NOTE — Assessment & Plan Note (Signed)
 Presumed ,  by GI.  Patient was prescribed rifaxamin recently but has had no improvement in her diarrhea or nausea

## 2024-02-18 NOTE — Patient Instructions (Addendum)
 Please Try using  IBGUARD   (available via amaz[employer]) instead of regular peppermint  Try taking Benefiber with prebiotics   take one dose daily    I will find out why GI has not called you

## 2024-02-18 NOTE — Telephone Encounter (Signed)
 Copied from CRM #[id number]. Topic: Clinical - Prescription Issue >> Feb 18, 2024  3:01 PM Hamdi H wrote: Reason for CRM: [facility] called to let us  know that they need 2 new prescriptions for HYDROcodone -acetaminophen  (NORCO/VICODIN) 5-325 MG tablet with an early fill date. The last rx sent didn't have the earliest fill date noted, so they came back invalid.

## 2024-02-18 NOTE — Assessment & Plan Note (Signed)
 Etiology unclear.   Worse since MALS surgery and celiac artery stenting ,  but not considered as causative per [facility].  Gastric emptying study was borderline abnormal last year at [facility]. I reordered it, but patient cancelled it , apparently advised to do so by [facility] , but has yet to have appt with [facility]  scheduled.  Will try adding IBGARD ad benefiber with prebiotics and request that she be scheduled with [facility] ASAP . Thankfully the Megace  is helping her appetite.

## 2024-02-18 NOTE — Assessment & Plan Note (Signed)
 Seen in August 2025 for follow up  by [facility] Surgeon:  s/p median arcuate ligament release on 09/11/22. 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 Currently, she still has intermittent diarrhea and vomiting after eating, saying that she has good days and bad days. Overall, her epigastric pain has improved significantly since her stent procedure. Current smoker (1 ppd).

## 2024-02-19 ENCOUNTER — Telehealth: Payer: Self-pay

## 2024-02-19 ENCOUNTER — Telehealth: Payer: Self-pay | Admitting: *Deleted

## 2024-02-19 LAB — CBC WITH DIFFERENTIAL/PLATELET
Basophils Absolute: 0 K/uL (ref 0.0–0.1)
Basophils Relative: 0.8 % (ref 0.0–3.0)
Eosinophils Absolute: 0.2 K/uL (ref 0.0–0.7)
Eosinophils Relative: 3.1 % (ref 0.0–5.0)
HCT: 23.7 % — CL (ref 36.0–46.0)
Hemoglobin: 7.5 g/dL — CL (ref 12.0–15.0)
Lymphocytes Relative: 28.3 % (ref 12.0–46.0)
Lymphs Abs: 1.8 K/uL (ref 0.7–4.0)
MCHC: 31.6 g/dL (ref 30.0–36.0)
MCV: 72.3 fl — ABNORMAL LOW (ref 78.0–100.0)
Monocytes Absolute: 0.5 K/uL (ref 0.1–1.0)
Monocytes Relative: 8.1 % (ref 3.0–12.0)
Neutro Abs: 3.8 K/uL (ref 1.4–7.7)
Neutrophils Relative %: 59.7 % (ref 43.0–77.0)
Platelets: 237 K/uL (ref 150.0–400.0)
RBC: 3.28 Mil/uL — ABNORMAL LOW (ref 3.87–5.11)
RDW: 18.8 % — ABNORMAL HIGH (ref 11.5–15.5)
WBC: 6.3 K/uL (ref 4.0–10.5)

## 2024-02-19 LAB — LIPID PANEL
Cholesterol: 64 mg/dL (ref 0–200)
HDL: 28 mg/dL — ABNORMAL LOW (ref >39.00)
LDL Cholesterol: 24 mg/dL (ref 0–99)
NonHDL: 36.22
Total CHOL/HDL Ratio: 2
Triglycerides: 62 mg/dL (ref 0.0–149.0)
VLDL: 12.4 mg/dL (ref 0.0–40.0)

## 2024-02-19 LAB — COMPREHENSIVE METABOLIC PANEL WITH GFR
ALT: 8 U/L (ref 0–35)
AST: 11 U/L (ref 0–37)
Albumin: 3.9 g/dL (ref 3.5–5.2)
Alkaline Phosphatase: 57 U/L (ref 39–117)
BUN: 11 mg/dL (ref 6–23)
CO2: 22 meq/L (ref 19–32)
Calcium: 8.5 mg/dL (ref 8.4–10.5)
Chloride: 110 meq/L (ref 96–112)
Creatinine, Ser: 0.89 mg/dL (ref 0.40–1.20)
GFR: 66.52 mL/min (ref >60.00)
Glucose, Bld: 139 mg/dL — ABNORMAL HIGH (ref 70–99)
Potassium: 3.7 meq/L (ref 3.5–5.1)
Sodium: 139 meq/L (ref 135–145)
Total Bilirubin: 0.9 mg/dL (ref 0.2–1.2)
Total Protein: 5.7 g/dL — ABNORMAL LOW (ref 6.0–8.3)

## 2024-02-19 LAB — TSH: TSH: 0.78 u[IU]/mL (ref 0.35–5.50)

## 2024-02-19 LAB — HEMOGLOBIN A1C: Hgb A1c MFr Bld: 5.3 % (ref 4.6–6.5)

## 2024-02-19 LAB — LDL CHOLESTEROL, DIRECT: Direct LDL: 31 mg/dL

## 2024-02-19 NOTE — Telephone Encounter (Signed)
 CRITICAL VALUE STICKER  CRITICAL VALUE: Hemoglobin 7.5 and Hematocrit 23.7   RECEIVER (on-site recipient of call): Harlene ORN, CMA  DATE & TIME NOTIFIED: 02/19/2024 @ 9:52 am  MESSENGER (representative from lab): Niels  MD NOTIFIED: Danise Kettering, MD  TIME OF NOTIFICATION:9:53 am

## 2024-02-19 NOTE — Assessment & Plan Note (Signed)
 COMPLICATED by iron  deficiency  managed with periodic IV Venofer  ,  has not seen Dr B since January 2025.  Drop in hgb noted fro m8.9 previously to o 7. 5 per today's  CBC .  DR B and patient notified, follow up with Hematology ASAP

## 2024-02-19 NOTE — Telephone Encounter (Signed)
 LMTCB. Please relay message below to pt when she returns call.

## 2024-02-19 NOTE — Telephone Encounter (Signed)
 Spoke with pt in regards to lab results. Pt stated that she is scheduled for Monday 02/24/2024.

## 2024-02-19 NOTE — Telephone Encounter (Signed)
 Called patient left message to call office ask for Cheryl [name] concerning GI referral. Please advise patient to call [facility] at [phone] that their office has been trying to reach out to patient left messages and sent My chart message on 01/16/24.

## 2024-02-19 NOTE — Assessment & Plan Note (Signed)
Improving with use of chiropractic manipulation and exercises. Had been referred to [name] , who referred her to [facility] for ESI.  Had No response to first ESI Using vicodin and tramadol (2 each daily) and improving posture using exercises by chiropractor. Continue current regimen

## 2024-02-19 NOTE — Assessment & Plan Note (Signed)
 Continue Imdur  for management of chest pain, rosuvastatin  , carvedilol  and furosemide 

## 2024-02-19 NOTE — Telephone Encounter (Signed)
 noted

## 2024-02-20 ENCOUNTER — Encounter: Payer: Self-pay | Admitting: Physician Assistant

## 2024-02-20 ENCOUNTER — Ambulatory Visit: Payer: Self-pay | Admitting: Internal Medicine

## 2024-02-20 ENCOUNTER — Encounter: Payer: Self-pay | Admitting: Internal Medicine

## 2024-02-22 ENCOUNTER — Other Ambulatory Visit: Payer: Self-pay | Admitting: Internal Medicine

## 2024-02-24 ENCOUNTER — Inpatient Hospital Stay

## 2024-02-24 ENCOUNTER — Encounter: Payer: Self-pay | Admitting: Internal Medicine

## 2024-02-24 ENCOUNTER — Telehealth: Payer: Self-pay

## 2024-02-24 ENCOUNTER — Other Ambulatory Visit: Payer: Self-pay | Admitting: *Deleted

## 2024-02-24 ENCOUNTER — Inpatient Hospital Stay: Attending: Internal Medicine

## 2024-02-24 ENCOUNTER — Inpatient Hospital Stay (HOSPITAL_BASED_OUTPATIENT_CLINIC_OR_DEPARTMENT_OTHER): Admitting: Internal Medicine

## 2024-02-24 VITALS — BP 119/60 | HR 69 | Temp 97.7°F | Resp 16 | Ht 63.0 in | Wt 136.6 lb

## 2024-02-24 DIAGNOSIS — Z807 Family history of other malignant neoplasms of lymphoid, hematopoietic and related tissues: Secondary | ICD-10-CM | POA: Insufficient documentation

## 2024-02-24 DIAGNOSIS — D509 Iron deficiency anemia, unspecified: Secondary | ICD-10-CM

## 2024-02-24 DIAGNOSIS — D563 Thalassemia minor: Secondary | ICD-10-CM

## 2024-02-24 DIAGNOSIS — Z79899 Other long term (current) drug therapy: Secondary | ICD-10-CM | POA: Insufficient documentation

## 2024-02-24 DIAGNOSIS — Z803 Family history of malignant neoplasm of breast: Secondary | ICD-10-CM | POA: Insufficient documentation

## 2024-02-24 DIAGNOSIS — D508 Other iron deficiency anemias: Secondary | ICD-10-CM

## 2024-02-24 DIAGNOSIS — E538 Deficiency of other specified B group vitamins: Secondary | ICD-10-CM | POA: Diagnosis not present

## 2024-02-24 DIAGNOSIS — R5382 Chronic fatigue, unspecified: Secondary | ICD-10-CM | POA: Insufficient documentation

## 2024-02-24 DIAGNOSIS — F17210 Nicotine dependence, cigarettes, uncomplicated: Secondary | ICD-10-CM | POA: Insufficient documentation

## 2024-02-24 LAB — CBC WITH DIFFERENTIAL (CANCER CENTER ONLY)
Abs Immature Granulocytes: 0.03 K/uL (ref 0.00–0.07)
Basophils Absolute: 0 K/uL (ref 0.0–0.1)
Basophils Relative: 1 %
Eosinophils Absolute: 0.2 K/uL (ref 0.0–0.5)
Eosinophils Relative: 3 %
HCT: 26.1 % — ABNORMAL LOW (ref 36.0–46.0)
Hemoglobin: 7.9 g/dL — ABNORMAL LOW (ref 12.0–15.0)
Immature Granulocytes: 1 %
Lymphocytes Relative: 28 %
Lymphs Abs: 1.8 K/uL (ref 0.7–4.0)
MCH: 22.3 pg — ABNORMAL LOW (ref 26.0–34.0)
MCHC: 30.3 g/dL (ref 30.0–36.0)
MCV: 73.7 fL — ABNORMAL LOW (ref 80.0–100.0)
Monocytes Absolute: 0.6 K/uL (ref 0.1–1.0)
Monocytes Relative: 9 %
Neutro Abs: 3.7 K/uL (ref 1.7–7.7)
Neutrophils Relative %: 58 %
Platelet Count: 211 K/uL (ref 150–400)
RBC: 3.54 MIL/uL — ABNORMAL LOW (ref 3.87–5.11)
RDW: 19.4 % — ABNORMAL HIGH (ref 11.5–15.5)
WBC Count: 6.3 K/uL (ref 4.0–10.5)
nRBC: 0.8 % — ABNORMAL HIGH (ref 0.0–0.2)

## 2024-02-24 LAB — ABO/RH: ABO/RH(D): O POS

## 2024-02-24 LAB — BASIC METABOLIC PANEL WITH GFR
Anion gap: 5 (ref 5–15)
BUN: 12 mg/dL (ref 8–23)
CO2: 21 mmol/L — ABNORMAL LOW (ref 22–32)
Calcium: 8.6 mg/dL — ABNORMAL LOW (ref 8.9–10.3)
Chloride: 111 mmol/L (ref 98–111)
Creatinine, Ser: 0.94 mg/dL (ref 0.44–1.00)
GFR, Estimated: >60 mL/min (ref >60)
Glucose, Bld: 92 mg/dL (ref 70–99)
Potassium: 4.2 mmol/L (ref 3.5–5.1)
Sodium: 137 mmol/L (ref 135–145)

## 2024-02-24 LAB — IRON AND TIBC
Iron: 147 ug/dL (ref 28–170)
Saturation Ratios: 57 % — ABNORMAL HIGH (ref 10.4–31.8)
TIBC: 260 ug/dL (ref 250–450)
UIBC: 113 ug/dL

## 2024-02-24 LAB — FERRITIN: Ferritin: 209 ng/mL (ref 11–307)

## 2024-02-24 LAB — VITAMIN B12: Vitamin B-12: 340 pg/mL (ref 180–914)

## 2024-02-24 NOTE — Assessment & Plan Note (Addendum)
#   Chronic microcytic anemia/history of beta thalassemia  [baseline around 9-10]- out of proportion to her anemia. S/p IV venoferx4 in fall of 2017.  # Currently worsening anemia hemoglobin 7-8-recommend further evaluation with a bone marrow biopsy to look for other causes-especially as patient is quite symptomatic.Discussed with the patient the bone marrow biopsy and aspiration indication and procedure at length.  Given significant discomfort involved-I would recommend under sedation/with interventional radiology. I discussed the potential complications include-bleeding/trauma and risk of infection; which are fortunately very rare.  Patient is in agreement. Patient will sign the consent prior to the procedure. Bone marrow biopsy/aspiration is ordered.    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  Discussed with blood bank.  # PVD-status post abdominal stenting.  # DISPOSITION: # bone marrow Biopsy- in next 1-2 weeks- # Blood 1 unit tomorrow # follow up in 3 weeks- APP- labs- cbc/bmp; HOLD tube; D-2 possible 1 unit- Dr.B   Cc; Dr.Tullo.

## 2024-02-24 NOTE — Telephone Encounter (Signed)
 Left message for pt to call us  back about her bone marrow bx appt.  IR can get her in Thurs 10/9 at 8:30a an arrive at 7:30a.

## 2024-02-24 NOTE — Progress Notes (Signed)
 [facility] CONSULT NOTE  Patient Care Team: Marylynn Verneita CROME, MD as PCP - General (Internal Medicine) Perla Evalene PARAS, MD as PCP - Cardiology (Cardiology) Dessa Reyes ORN, MD as Consulting Physician (General Surgery) Rennie Cindy SAUNDERS, MD as Consulting Physician (Oncology) Tamea Dedra CROME, MD as Consulting Physician (Pulmonary Disease)  CHIEF COMPLAINTS/PURPOSE OF CONSULTATION: Anemia.  HEMATOLOGY HISTORY  # CHRONIC MICROCYTIC ANEMIA- BETA THALASSEMIA MINOR [colo- 2016; Dr. Jerold July 2017- IV trial of Venofer  x4   # Lung nodules [CT- sub cm Jan 2017]-2019 CT scan stable.  Benign.  HISTORY OF PRESENTING ILLNESS: Alone. Walking independently.   Cheryl [name] Half 68 y.o.  female with a history of beta thalassemia minor; also iron  deficiency anemia/B12 deficiency is here for follow-up-noted to have worsening anemia.  C/o no energy, having really bad headaches, pain gets to be 9/10. This has been happening for a few months. Taking tylenol , not helping.  Patient is currently retired.  Patient has chronic mild fatigue.  Intermittent dizziness.  Denies any shortness of breath or cough.  She denies any blood in stools or black or stools.  Appetite is good.  No weight loss.  Review of Systems  Constitutional:  Positive for weight loss. Negative for chills, diaphoresis and fever.  HENT:  Negative for nosebleeds and sore throat.   Eyes:  Negative for double vision.  Respiratory:  Negative for cough, hemoptysis, sputum production and wheezing.   Cardiovascular:  Negative for chest pain, palpitations, orthopnea and leg swelling.  Gastrointestinal:  Positive for diarrhea. Negative for abdominal pain, blood in stool, constipation, heartburn, melena, nausea and vomiting.  Genitourinary:  Negative for dysuria, frequency and urgency.  Musculoskeletal:  Negative for back pain and joint pain.  Skin: Negative.  Negative for itching and rash.  Neurological:  Positive for  dizziness. Negative for tingling, focal weakness, weakness and headaches.  Endo/Heme/Allergies:  Does not bruise/bleed easily.  Psychiatric/Behavioral:  Negative for depression. The patient is not nervous/anxious and does not have insomnia.      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   Arrhythmia    left bundle branch block   Arthritis    Blood in stool    Chicken pox    Generalized headaches    History of blood transfusion    Kidney disease, chronic, stage III (GFR 30-59 ml/min) (HCC)    LBBB (left bundle branch block)    Myocardial infarction (HCC)    Neuromuscular disorder (HCC)    Renal disorder    Right lateral epicondylitis 08/13/2017   Thyroid  disease    UTI (lower urinary tract infection)     SURGICAL HISTORY: Past Surgical History:  Procedure Laterality Date   ABDOMINAL HYSTERECTOMY  1997   APPENDECTOMY  1981   BREAST EXCISIONAL BIOPSY Bilateral 1976   neg   BREAST SURGERY  1976   CARDIAC CATHETERIZATION  2004   [facility]   CARDIAC CATHETERIZATION  2006   [facility]   cystic fibrosis tumor removal  1983   RIGHT/LEFT HEART CATH AND CORONARY ANGIOGRAPHY Bilateral 07/15/2023   Procedure: RIGHT/LEFT HEART CATH AND CORONARY ANGIOGRAPHY;  Surgeon: Darron Deatrice LABOR, MD;  Location: [facility] INVASIVE CV LAB;  Service: Cardiovascular;  Laterality: Bilateral;   THUMB AMPUTATION  1992   traumatic   TONSILLECTOMY  AND ADENOIDECTOMY  1964   TUBAL LIGATION  1980   VISCERAL ANGIOGRAPHY N/A 06/29/2022   Procedure: VISCERAL ANGIOGRAPHY;  Surgeon: Jama Cordella MATSU, MD;  Location: [facility] INVASIVE CV LAB;  Service: Cardiovascular;  Laterality: N/A;   VISCERAL ANGIOGRAPHY N/A 07/17/2022   Procedure: VISCERAL ANGIOGRAPHY;  Surgeon: Jama Cordella MATSU, MD;   Location: [facility] INVASIVE CV LAB;  Service: Cardiovascular;  Laterality: N/A;    SOCIAL HISTORY: Social History   Socioeconomic History   Marital status: Married    Spouse name: Not on file   Number of children: 3   Years of education: Not on file   Highest education level: Not on file  Occupational History   Occupation: Retired  Tobacco Use   Smoking status: Every Day    Current packs/day: 1.00    Average packs/day: 1 pack/day for 44.9 years (44.9 ttl pk-yrs)    Types: Cigarettes    Start date: 04/11/1979   Smokeless tobacco: Never   Tobacco comments:    1-3 cigarettes daily since 03/2023. Cutting back alot  Vaping Use   Vaping status: Never Used  Substance and Sexual Activity   Alcohol use: Yes    Comment: occasional   Drug use: No   Sexual activity: Not on file  Other Topics Concern   Not on file  Social History Narrative   Married to [employer]; Works for [employer], Engineer, structural. Quit smoking 2018; ocassional alcohol; lives near to [place].    Social Drivers of Health   Financial Resource Strain: Low Risk  (10/15/2023)   Overall Financial Resource Strain (CARDIA)    Difficulty of Paying Living Expenses: Not hard at all  Food Insecurity: No Food Insecurity (11/18/2023)   Received from [facility]   Hunger Vital Sign    Within the past 12 months, you worried that your food would run out before you got the money to buy more.: Never true    Within the past 12 months, the food you bought just didn't last and you didn't have money to get more.: Never true  Transportation Needs: No Transportation Needs (11/18/2023)   Received from [facility] - Transportation    Lack of Transportation (Medical): No    Lack of Transportation (Non-Medical): No  Physical Activity: Inactive (10/15/2023)   Exercise Vital Sign    Days of Exercise per Week: 0 days    Minutes of Exercise per Session: 0 min  Stress: No Stress Concern Present (10/15/2023)   [employer] - Occupational Stress Questionnaire    Feeling of Stress : Only a little  Social Connections: Moderately Integrated (10/15/2023)   Social Connection and Isolation Panel    Frequency of Communication with Friends and Family: More than three times a week    Frequency of Social Gatherings with Friends and Family: More than three times a week    Attends Religious Services: Never    Database administrator or Organizations: Yes    Attends Engineer, structural: More than 4 times per year    Marital Status: Married  Catering manager Violence: Not At Risk (01/16/2024)   Received from [facility]   Humiliation, Afraid, Rape, and Kick questionnaire    Within the last year, have you been afraid of your partner or ex-partner?: No    Within the last year, have you been humiliated or emotionally abused in other ways by your partner or ex-partner?: No    Within the  last year, have you been kicked, hit, slapped, or otherwise physically hurt by your partner or ex-partner?: No    Within the last year, have you been raped or forced to have any kind of sexual activity by your partner or ex-partner?: No    FAMILY HISTORY: Family History  Problem Relation Age of Onset   Heart disease Mother    Arthritis Mother    Cancer Mother        breast   Hyperlipidemia Mother    Hypertension Mother    Heart attack Mother    Breast cancer Mother 47   Kidney disease Mother    Heart disease Father    Diabetes Father    Arthritis Father    Hypertension Father    Parkinson's disease Father    Hodgkin's lymphoma Father        hodgkins disease, prostate   Heart disease Sister    Diabetes Sister    Diabetes Maternal Aunt    Heart disease Maternal Grandmother    Diabetes Maternal Grandmother    Heart disease Maternal Grandfather    Stroke Maternal Grandfather    Heart disease Paternal Grandmother    Diabetes Paternal Grandmother    Stroke Paternal Grandfather    Heart  disease Paternal Grandfather    Diabetes Paternal Grandfather    Breast cancer Cousin        2 pat cousins   Heart disease Brother 67       ami x 8,  4 vessel CABG    Diabetes Brother    Liver disease Brother    Lung disease Brother     ALLERGIES:  is allergic to peanuts [peanut oil], penicillins, sulfa antibiotics, corticosteroids, influenza vac split [influenza virus vaccine], losartan , mobic  [meloxicam ], nickel, nsaids, penicillin g, and prednisone .  MEDICATIONS:  Current Outpatient Medications  Medication Sig Dispense Refill   Albuterol -Budesonide (AIRSUPRA ) 90-80 MCG/ACT AERO Inhale 1 puff into the lungs every 6 (six) hours as needed. 5.9 g 11   amitriptyline  (ELAVIL ) 50 MG tablet TAKE 1 TABLET BY MOUTH AT BEDTIME 90 tablet 1   buPROPion  (WELLBUTRIN ) 100 MG tablet Take 1.5 tablets (150 mg total) by mouth 2 (two) times daily. 90 tablet 1   Calcium  Carbonate-Vit D-Min (CALCIUM  1200 PO) Take 1,200 mg by mouth daily.     carvedilol  (COREG ) 6.25 MG tablet Take 1 tablet (6.25 mg total) by mouth 2 (two) times daily with a meal. 180 tablet 3   Cholecalciferol (VITAMIN D ) 125 MCG (5000 UT) CAPS Take 5,000 Units by mouth daily.     clopidogrel (PLAVIX) 75 MG tablet Take 75 mg by mouth daily.     folic acid  (FOLVITE ) 1 MG tablet Take 1 tablet (1 mg total) by mouth daily. 90 tablet 3   furosemide  (LASIX ) 20 MG tablet Take 1 tablet (20 mg total) by mouth daily as needed. 30 tablet 3   gabapentin  (NEURONTIN ) 300 MG capsule Take 1 capsule (300 mg total) by mouth 3 (three) times daily. 270 capsule 1   HYDROcodone -acetaminophen  (NORCO/VICODIN) 5-325 MG tablet Take 1 tablet by mouth 2 (two) times daily as needed for moderate pain (pain score 4-6). 60 tablet 0   HYDROcodone -acetaminophen  (NORCO/VICODIN) 5-325 MG tablet Take 1 tablet by mouth 2 (two) times daily as needed for moderate pain (pain score 4-6). 60 tablet 0   HYDROcodone -acetaminophen  (NORCO/VICODIN) 5-325 MG tablet Take 1 tablet by mouth 2  (two) times daily as needed for moderate pain (pain score 4-6). 60 tablet 0  megestrol  (MEGACE ) 20 MG tablet Take 1 tablet by mouth once daily 30 tablet 1   nitroGLYCERIN  (NITROSTAT ) 0.4 MG SL tablet Place 1 tablet (0.4 mg total) under the tongue every 5 (five) minutes as needed for chest pain. DISSOLVE 1 TABLET UNDER THE TONGUE EVERY 5 MINUTES AS  NEEDED FOR CHEST PAIN. MAX  OF 3 TABLETS IN 15 MINUTES. CALL 911 IF PAIN PERSISTS. 100 tablet 3   omeprazole  (PRILOSEC) 40 MG capsule Take 1 capsule (40 mg total) by mouth daily. 90 capsule 1   ondansetron  (ZOFRAN ) 4 MG tablet Take 1 tablet (4 mg total) by mouth every 8 (eight) hours as needed. 60 tablet 2   Peppermint Oil (IBGARD) 90 MG CPCR Take 1 capsule by mouth daily.     Prasterone, DHEA, 10 MG CAPS Take 10 mg by mouth daily.     progesterone  (PROMETRIUM ) 200 MG capsule Take 200 mg by mouth at bedtime.     rOPINIRole  (REQUIP ) 1 MG tablet TAKE 1 TABLET BY MOUTH THREE TIMES DAILY 90 tablet 1   rosuvastatin  (CRESTOR ) 10 MG tablet Take 1 tablet (10 mg total) by mouth daily. 90 tablet 1   tiZANidine  (ZANAFLEX ) 2 MG tablet Take 1 tablet (2 mg total) by mouth at bedtime. 90 tablet 1   traMADol  (ULTRAM ) 50 MG tablet Take 1 tablet (50 mg total) by mouth 2 (two) times daily as needed. for pain 60 tablet 2   venlafaxine  XR (EFFEXOR -XR) 75 MG 24 hr capsule Take 1 capsule (75 mg total) by mouth daily. 90 capsule 1   vitamin C  (ASCORBIC ACID ) 500 MG tablet Take 500 mg by mouth daily.     No current facility-administered medications for this visit.      SABRA  PHYSICAL EXAMINATION:   Vitals:   02/24/24 1258  BP: 119/60  Pulse: 69  Resp: 16  Temp: 97.7 F (36.5 C)  SpO2: 100%     Filed Weights   02/24/24 1258  Weight: 136 lb 9.6 oz (62 kg)     Physical Exam HENT:     Head: Normocephalic and atraumatic.     Mouth/Throat:     Pharynx: No oropharyngeal exudate.  Eyes:     Pupils: Pupils are equal, round, and reactive to light.   Cardiovascular:     Rate and Rhythm: Normal rate and regular rhythm.  Pulmonary:     Effort: Pulmonary effort is normal. No respiratory distress.     Breath sounds: Normal breath sounds. No wheezing.  Abdominal:     General: Bowel sounds are normal. There is no distension.     Palpations: Abdomen is soft. There is no mass.     Tenderness: There is no abdominal tenderness. There is no guarding or rebound.  Musculoskeletal:        General: No tenderness. Normal range of motion.     Cervical back: Normal range of motion and neck supple.  Skin:    General: Skin is warm.  Neurological:     Mental Status: She is alert and oriented to person, place, and time.  Psychiatric:        Mood and Affect: Affect normal.      LABORATORY DATA:  I have reviewed the data as listed Lab Results  Component Value Date   WBC 6.3 02/24/2024   HGB 7.9 (L) 02/24/2024   HCT 26.1 (L) 02/24/2024   MCV 73.7 (L) 02/24/2024   PLT 211 02/24/2024   Recent Labs    05/14/23 1505 06/24/23  1427 10/24/23 1307 10/28/23 1816 11/12/23 1453 02/18/24 1356 02/24/24 1301  NA  --    < > 138 140 137 139 137  K  --    < > 4.3 4.0 5.0 3.7 4.2  CL  --    < > 107 105 107 110 111  CO2  --    < > 24 23 24 22  21*  GLUCOSE  --    < > 94 102* 81 139* 92  BUN  --    < > 14 10 15 11 12   CREATININE  --    < > 0.73 0.77 0.97 0.89 0.94  CALCIUM   --    < > 8.3* 9.7 8.7 8.5 8.6*  GFRNONAA  --    < > >60 >60  --   --  >60  PROT 6.8   < >  --  8.1 6.3 5.7*  --   ALBUMIN 4.5   < >  --  5.1* 4.4 3.9  --   AST 18   < >  --  20 19 11   --   ALT 22   < >  --  17 17 8   --   ALKPHOS 90   < >  --  98 70 57  --   BILITOT 0.9   < >  --  1.6* 1.1 0.9  --   BILIDIR 0.2  --   --   --  0.2  --   --    < > = values in this interval not displayed.     No results found.   ASSESSMENT & PLAN:   Microcytic anemia # Chronic microcytic anemia/history of beta thalassemia  [baseline around 9-10]- out of proportion to her anemia. S/p IV  venoferx4 in fall of 2017.  # Currently worsening anemia hemoglobin 7-8-recommend further evaluation with a bone marrow biopsy to look for other causes-especially as patient is quite symptomatic.Discussed with the patient the bone marrow biopsy and aspiration indication and procedure at length.  Given significant discomfort involved-I would recommend under sedation/with interventional radiology. I discussed the potential complications include-bleeding/trauma and risk of infection; which are fortunately very rare.  Patient is in agreement. Patient will sign the consent prior to the procedure. Bone marrow biopsy/aspiration is ordered.    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  Discussed with blood bank.  # PVD-status post abdominal stenting.  # DISPOSITION: # bone marrow Biopsy- in next 1-2 weeks- # Blood 1 unit tomorrow # follow up in 3 weeks- APP- labs- cbc/bmp; HOLD tube; D-2 possible 1 unit- Dr.B   Cc; Dr.Tullo.     Cindy JONELLE Joe, MD 02/24/2024 2:02 PM

## 2024-02-24 NOTE — Addendum Note (Signed)
 Addended by: RENNIE CINDY SAUNDERS on: 02/24/2024 04:52 PM   Modules accepted: Orders

## 2024-02-24 NOTE — Telephone Encounter (Signed)
 Pt ok with this date and time. Clarita B aware.

## 2024-02-24 NOTE — Progress Notes (Signed)
 C/o no energy, having really bad headaches, pain gets to be 9/10. This has been happening for a few months. Taking tylenol , not helping.  Celiac stent placed in July 2025.

## 2024-02-25 ENCOUNTER — Inpatient Hospital Stay

## 2024-02-25 DIAGNOSIS — D509 Iron deficiency anemia, unspecified: Secondary | ICD-10-CM

## 2024-02-25 DIAGNOSIS — D563 Thalassemia minor: Secondary | ICD-10-CM

## 2024-02-25 MED ORDER — SODIUM CHLORIDE 0.9% IV SOLUTION
250.0000 mL | INTRAVENOUS | Status: DC
  Administered 2024-02-25: 250 mL via INTRAVENOUS
  Filled 2024-02-25: qty 250

## 2024-02-25 MED ORDER — DIPHENHYDRAMINE HCL 25 MG PO CAPS
25.0000 mg | ORAL_CAPSULE | Freq: Once | ORAL | Status: AC
  Administered 2024-02-25: 25 mg via ORAL
  Filled 2024-02-25: qty 1

## 2024-02-25 MED ORDER — ACETAMINOPHEN 325 MG PO TABS
650.0000 mg | ORAL_TABLET | Freq: Once | ORAL | Status: AC
  Administered 2024-02-25: 650 mg via ORAL
  Filled 2024-02-25: qty 2

## 2024-02-25 NOTE — Patient Instructions (Signed)

## 2024-02-26 LAB — TYPE AND SCREEN
ABO/RH(D): O POS
Antibody Screen: POSITIVE
Donor AG Type: NEGATIVE
Unit division: 0

## 2024-02-26 LAB — BPAM RBC
Blood Product Expiration Date: 202510102359
ISSUE DATE / TIME: 202509301332
Unit Type and Rh: 202510102359
Unit Type and Rh: 5100

## 2024-02-27 ENCOUNTER — Encounter: Payer: Self-pay | Admitting: Internal Medicine

## 2024-02-27 LAB — PREPARE RBC (CROSSMATCH)

## 2024-02-28 ENCOUNTER — Ambulatory Visit (INDEPENDENT_AMBULATORY_CARE_PROVIDER_SITE_OTHER): Admitting: Pulmonary Disease

## 2024-02-28 ENCOUNTER — Encounter: Payer: Self-pay | Admitting: Pulmonary Disease

## 2024-02-28 VITALS — BP 106/64 | HR 70 | Temp 98.0°F | Ht 63.0 in | Wt 137.0 lb

## 2024-02-28 DIAGNOSIS — F17210 Nicotine dependence, cigarettes, uncomplicated: Secondary | ICD-10-CM

## 2024-02-28 DIAGNOSIS — D509 Iron deficiency anemia, unspecified: Secondary | ICD-10-CM

## 2024-02-28 DIAGNOSIS — J4489 Other specified chronic obstructive pulmonary disease: Secondary | ICD-10-CM | POA: Diagnosis not present

## 2024-02-28 DIAGNOSIS — I5022 Chronic systolic (congestive) heart failure: Secondary | ICD-10-CM | POA: Diagnosis not present

## 2024-02-28 DIAGNOSIS — I428 Other cardiomyopathies: Secondary | ICD-10-CM | POA: Diagnosis not present

## 2024-02-28 NOTE — Progress Notes (Signed)
 Subjective:    Patient ID: Cheryl [name], female    DOB: 23-May-1956, 68 y.o.   MRN: [id number]  Patient Care Team: Marylynn Verneita CROME, MD as PCP - General (Internal Medicine) Perla Evalene PARAS, MD as PCP - Cardiology (Cardiology) Dessa Reyes ORN, MD as Consulting Physician (General Surgery) Rennie Cindy SAUNDERS, MD as Consulting Physician (Oncology) Tamea Dedra CROME, MD as Consulting Physician (Pulmonary Disease)  Chief Complaint  Patient presents with   COPD    Shortness of breath on exertion.     BACKGROUND/INTERVAL:Patient is a 68 year old smoker with a 44-pack-year history of smoking presents for follow-up of shortness of breath and dizziness on exertion for approximately a year.  She was initially seen here on 20 August 2023.  HPI Discussed the use of AI scribe software for clinical note transcription with the patient, who gave verbal consent to proceed.  History of Present Illness   Cheryl [name] is a 68 year old female with COPD who presents for follow-up regarding her respiratory symptoms.  She experiences relief in her breathing with cooler weather, while humidity exacerbates her symptoms. She uses the Air Supra inhaler, having used it twice last week during particularly hot conditions at home.  She discontinued using Breztri  inhaler.  She is actively reducing her smoking and is currently down to three cigarettes a day. She had intended to quit smoking entirely following her surgery in July but has not yet achieved this goal.  She is unable to receive a flu shot due to an allergy. She is scheduled for a bone marrow biopsy next week, which she mentions in the context of her ongoing health issues.  She has significant anemia which could have explain her dyspnea and dizziness upon standing.  She is part of a lung cancer screening program but has not received any recent updates regarding her status in the program.     DATA 07/15/2023 right and left heart  cath: With mild nonobstructive coronary artery disease, moderately reduced LV systolic function with global hypokinesis, right heart catheterization showed normal right and left filling pressures, normal pulmonary pressure and normal cardiac output.  At with nonischemic cardiomyopathy. 07/19/2023 echocardiogram: 40 to 45% atrophy, grade 1 diastolic dysfunction, mild hypokinesis of the left ventricular and entire anteroseptal wall.  RV systolic function is normal, right ventricular size is normal, no valvular abnormalities 11/05/2023 PFTs: FEV1 2.37 L or 106% predicted, FVC 2.95 L or 100% predicted, FEV1/FVC 80%, lung volumes normal.  Diffusion capacity mildly reduced corrects by alveolar volume.  No significant bronchodilator response.  PFTs do not explain patient's dyspnea.  Review of Systems A 10 point review of systems was performed and it is as noted above otherwise negative.   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   Helicobacter pylori gastritis 05/03/2022   Benign positional vertigo 10/31/2021   Lateral epicondylitis, left elbow 10/17/2021   Emphysema of lung (HCC) 02/04/2021   Right shoulder pain 11/21/2020   Aortic atherosclerosis 10/27/2020   Lumbar stenosis without neurogenic claudication 03/24/2020   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   Hospital discharge follow-up  02/15/2019   Coronary artery disease due to lipid rich plaque 01/29/2019   Restless legs 11/09/2018   Educated about COVID-19 virus infection 11/09/2018   Osteoarthritis of both shoulders 08/03/2018    Cervical radiculopathy 12/30/2017   Nonallopathic lesion of cervical region 08/13/2017   Nonallopathic lesion of thoracic region 08/13/2017   Nonallopathic lesion of lumbosacral region 08/13/2017   Chronic renal insufficiency 04/03/2017   Renal hypertension 12/16/2016   Diverticulosis of colon 09/02/2016   PAD (peripheral artery disease) 08/22/2016   Iron  deficiency anemia 11/25/2015   Microcytic anemia 11/15/2015   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   Chronic neck pain 06/21/2015   Arthralgia 05/19/2015   Pancreatic cyst 10/24/2014   Multiple lung nodules 10/24/2014   Hyperlipidemia 10/20/2014   Nausea and vomiting in adult patient 08/11/2014   Degenerative lumbar spinal stenosis 09/18/2013   Screening for osteoporosis 08/20/2013   Routine general medical examination at a health care facility 10/26/2012   Anxiety 08/22/2012   Irritable bowel syndrome 08/17/2012   Tobacco abuse 08/17/2012   Hearing loss d/t noise 08/17/2012   Diverticulosis 08/17/2012   OSA (obstructive sleep apnea) 08/17/2012   Hematuria, microscopic 08/16/2012   Thalassemia minor 08/16/2012    Social History   Tobacco Use   Smoking status: Every Day    Current packs/day: 0.25    Average packs/day: 1 pack/day for 44.9 years (44.3 ttl pk-yrs)    Types: Cigarettes    Start date: 04/11/1979   Smokeless tobacco: Never   Tobacco comments:    1-3 cigarettes daily since 03/2023. Cutting back alot  Substance Use Topics   Alcohol use: Yes    Comment: occasional    Allergies  Allergen Reactions   Peanuts [Peanut Oil]    Penicillins Other (See Comments)    Hives,rash,nausea,swelling,    Sulfa Antibiotics Other (See Comments)    Hives,rash,nausea,swelling   Corticosteroids     Other Reaction(s): RASH, SWELLING   Influenza Vac Split [Influenza Virus Vaccine]     Pleurisy  1996   Losartan      Reaction: Hypotension   Mobic  [Meloxicam ] Nausea Only    Renal insufficiency   Nickel    Nsaids Other (See  Comments)    Decreased gfr    Penicillin G     Other Reaction(s): RASH , SWELLING   Prednisone  Rash    Current Meds  Medication Sig   Albuterol -Budesonide (AIRSUPRA ) 90-80 MCG/ACT AERO Inhale 1 puff into the lungs every 6 (six) hours as needed.   amitriptyline  (ELAVIL ) 50 MG tablet TAKE 1 TABLET BY MOUTH AT BEDTIME   buPROPion  (WELLBUTRIN ) 100 MG tablet Take 1.5 tablets (150 mg total) by mouth 2 (two) times daily.   Calcium  Carbonate-Vit D-Min (CALCIUM  1200 PO) Take 1,200 mg by mouth daily.   carvedilol  (COREG ) 6.25 MG tablet Take 1 tablet (6.25 mg total) by mouth 2 (two) times daily with a meal.   Cholecalciferol (VITAMIN D ) 125 MCG (5000 UT) CAPS Take 5,000 Units by mouth daily.   clopidogrel (PLAVIX) 75 MG tablet Take 75 mg by mouth daily.   folic acid  (FOLVITE ) 1 MG tablet Take 1 tablet (1 mg total) by mouth daily.   furosemide  (LASIX ) 20 MG tablet Take 1 tablet (20 mg total) by mouth daily as needed.   gabapentin  (NEURONTIN ) 300 MG capsule Take 1 capsule (300 mg total) by mouth 3 (three) times daily.   HYDROcodone -acetaminophen  (NORCO/VICODIN) 5-325 MG tablet Take 1 tablet by mouth 2 (two) times daily as needed for moderate pain (pain  score 4-6).   HYDROcodone -acetaminophen  (NORCO/VICODIN) 5-325 MG tablet Take 1 tablet by mouth 2 (two) times daily as needed for moderate pain (pain score 4-6).   HYDROcodone -acetaminophen  (NORCO/VICODIN) 5-325 MG tablet Take 1 tablet by mouth 2 (two) times daily as needed for moderate pain (pain score 4-6).   megestrol  (MEGACE ) 20 MG tablet Take 1 tablet by mouth once daily   nitroGLYCERIN  (NITROSTAT ) 0.4 MG SL tablet Place 1 tablet (0.4 mg total) under the tongue every 5 (five) minutes as needed for chest pain. DISSOLVE 1 TABLET UNDER THE TONGUE EVERY 5 MINUTES AS  NEEDED FOR CHEST PAIN. MAX  OF 3 TABLETS IN 15 MINUTES. CALL 911 IF PAIN PERSISTS.   omeprazole  (PRILOSEC) 40 MG capsule Take 1 capsule (40 mg total) by mouth daily.   ondansetron  (ZOFRAN ) 4  MG tablet Take 1 tablet (4 mg total) by mouth every 8 (eight) hours as needed.   Peppermint Oil (IBGARD) 90 MG CPCR Take 1 capsule by mouth daily.   Prasterone, DHEA, 10 MG CAPS Take 10 mg by mouth daily.   progesterone  (PROMETRIUM ) 200 MG capsule Take 200 mg by mouth at bedtime.   rOPINIRole  (REQUIP ) 1 MG tablet TAKE 1 TABLET BY MOUTH THREE TIMES DAILY   rosuvastatin  (CRESTOR ) 10 MG tablet Take 1 tablet (10 mg total) by mouth daily.   tiZANidine  (ZANAFLEX ) 2 MG tablet Take 1 tablet (2 mg total) by mouth at bedtime.   traMADol  (ULTRAM ) 50 MG tablet Take 1 tablet (50 mg total) by mouth 2 (two) times daily as needed. for pain   venlafaxine  XR (EFFEXOR -XR) 75 MG 24 hr capsule Take 1 capsule (75 mg total) by mouth daily.   vitamin C  (ASCORBIC ACID ) 500 MG tablet Take 500 mg by mouth daily.    Immunization History  Administered Date(s) Administered   Pneumococcal Polysaccharide-23 07/27/2014   Tdap 03/19/2011, 07/26/2020        Objective:     BP 106/64   Pulse 70   Temp 98 F (36.7 C) (Temporal)   Ht 5' 3 (1.6 m)   Wt 137 lb (62.1 kg)   SpO2 97%   BMI 24.27 kg/m   SpO2: 97 %  GENERAL: Well-developed, well-nourished woman, no acute distress.  Fully ambulatory, no conversational dyspnea. HEAD: Normocephalic, atraumatic.  EYES: Pupils equal, round, reactive to light.  No scleral icterus.  MOUTH: Dentition intact, some chipped teeth, oral mucosa moist, no thrush. NECK: Supple. No thyromegaly. Trachea midline. No JVD.  No adenopathy. PULMONARY: Good air entry bilaterally.  No adventitious sounds. CARDIOVASCULAR: S1 and S2. Regular rate and rhythm.  No rubs, murmurs or gallops heard. ABDOMEN: Benign. MUSCULOSKELETAL: No joint deformity, no clubbing, no edema.  NEUROLOGIC: No overt focal deficit, no gait disturbance, speech is fluent. SKIN: Intact,warm,dry. PSYCH: Mood and behavior normal.     Assessment & Plan:     ICD-10-CM   1. COPD with asthma (HCC) - MILD  J44.89      2. Microcytic anemia  D50.9     3. Chronic systolic heart failure (HCC)  P49.77     4. Nonischemic cardiomyopathy (HCC)  I42.8     5. Tobacco dependence due to cigarettes  F17.210 Ambulatory Referral for Lung Cancer Scre      Orders Placed This Encounter  Procedures   Ambulatory Referral for Lung Cancer Scre    Referral Priority:   Routine    Referral Type:   Consultation    Referral Reason:   Specialty Services Required  Number of Visits Requested:   1   Discussion:    Chronic obstructive pulmonary disease (COPD) COPD is well-controlled with improvement in symptoms due to cooler weather. She used the inhaler twice last week during hot weather, indicating good control with the current medication regimen. - Continue current inhaler regimen with Air Supra - Schedule follow-up in six months - Ensure enrollment in lung cancer screening program  Microcytic anemia Being evaluated by hematology.  Has upcoming bone marrow aspiration.  Tobacco use She is currently smoking three cigarettes a day, showing a reduction in tobacco use. She expresses a desire to quit smoking completely.  Allergy to influenza vaccine She reports an allergy to the influenza vaccine, which precludes her from receiving it.      Advised if symptoms do not improve or worsen, to please contact office for sooner follow up or seek emergency care.    I spent 32 minutes of dedicated to the care of this patient on the date of this encounter to include pre-visit review of records, face-to-face time with the patient discussing conditions above, post visit ordering of testing, clinical documentation with the electronic health record, making appropriate referrals as documented, and communicating necessary findings to members of the patients care team.     C. Leita Sanders, MD Advanced Bronchoscopy PCCM [facility]    *This note was generated using voice recognition software/Dragon and/or AI  transcription program.  Despite best efforts to proofread, errors can occur which can change the meaning. Any transcriptional errors that result from this process are unintentional and may not be fully corrected at the time of dictation.

## 2024-02-28 NOTE — Patient Instructions (Signed)
 VISIT SUMMARY:  Today, you came in for a follow-up visit regarding your respiratory symptoms related to COPD. We discussed your current condition, smoking habits, and other health concerns.  YOUR PLAN:  -CHRONIC OBSTRUCTIVE PULMONARY DISEASE (COPD): COPD is a chronic lung condition that makes it hard to breathe. Your symptoms have improved with the cooler weather, and you only needed to use your Air Supra inhaler twice last week. Continue using your inhaler as needed, and we will schedule a follow-up in six months. Also, ensure you stay enrolled in the lung cancer screening program.  -TOBACCO USE: You are currently smoking three cigarettes a day, which is a reduction from before. It's great that you are working towards quitting smoking completely. Keep up the effort, and let us  know if you need any support or resources to help you quit.  -ALLERGY TO INFLUENZA VACCINE: You have an allergy to the flu shot, so you should avoid getting it. Make sure to inform any healthcare providers about this allergy.  INSTRUCTIONS:  Please continue using your Air Supra inhaler as needed and schedule a follow-up appointment in six months. Stay enrolled in the lung cancer screening program and keep us  updated on any changes. If you need support to quit smoking, let us  know.

## 2024-03-02 NOTE — Progress Notes (Deleted)
 [facility] [address] [place] [address] Phone: [phone] Subjective:    I'm seeing this patient by the request  of:  Marylynn Verneita CROME, MD  CC:   Cheryl [name]  Cheryl [name] is a 68 y.o. female coming in with complaint of neck pain. Last seen in March 2024. 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   Arrhythmia    left bundle branch block   Arthritis    Blood in stool    Chicken pox    Generalized headaches    History of blood transfusion    Kidney disease, chronic, stage III (GFR 30-59 ml/min) (HCC)    LBBB (left bundle branch block)    Myocardial infarction (HCC)    Neuromuscular disorder (HCC)    Renal disorder    Right lateral epicondylitis 08/13/2017   Thyroid  disease    UTI (lower urinary tract infection)    Past Surgical History:  Procedure Laterality Date   ABDOMINAL HYSTERECTOMY  1997   APPENDECTOMY  1981   BREAST EXCISIONAL BIOPSY Bilateral 1976   neg   BREAST SURGERY  1976   CARDIAC CATHETERIZATION  [facility]   [facility]   CARDIAC CATHETERIZATION  2006   [facility]   cystic fibrosis tumor removal  1983   RIGHT/LEFT HEART CATH AND CORONARY ANGIOGRAPHY Bilateral 07/15/2023   Procedure: RIGHT/LEFT HEART CATH AND CORONARY ANGIOGRAPHY;  Surgeon: Darron Deatrice LABOR, MD;  Location: [facility] INVASIVE CV LAB;  Service: Cardiovascular;  Laterality: Bilateral;   THUMB AMPUTATION  1992   traumatic   TONSILLECTOMY AND ADENOIDECTOMY  1964   TUBAL LIGATION  1980   VISCERAL ANGIOGRAPHY N/A 06/29/2022   Procedure: VISCERAL ANGIOGRAPHY;  Surgeon: Jama Cordella MATSU, MD;  Location: [facility] INVASIVE CV LAB;  Service: Cardiovascular;  Laterality: N/A;   VISCERAL ANGIOGRAPHY N/A 07/17/2022   Procedure: VISCERAL  ANGIOGRAPHY;  Surgeon: Jama Cordella MATSU, MD;  Location: [facility] INVASIVE CV LAB;  Service: Cardiovascular;  Laterality: N/A;   Social History   Socioeconomic History   Marital status: Married    Spouse name: Not on file   Number of children: 3   Years of education: Not on file   Highest education level: Not on file  Occupational History   Occupation: Retired  Tobacco Use   Smoking status: Every Day    Current packs/day: 0.25    Average packs/day: 1 pack/day for 44.9 years (44.3 ttl pk-yrs)    Types: Cigarettes    Start date: 04/11/1979   Smokeless tobacco: Never   Tobacco comments:    1-3 cigarettes daily since 03/2023. Cutting back alot  Vaping Use   Vaping status: Never Used  Substance and Sexual Activity   Alcohol use: Yes    Comment: occasional   Drug use: No   Sexual activity: Not on file  Other Topics Concern   Not on file  Social History Narrative   Married to [employer]; Works for [employer], Engineer, structural. Quit smoking 2018; ocassional alcohol; lives near to [place].    Social Drivers of Health   Financial Resource Strain: Low Risk  (10/15/2023)   Overall Financial Resource Strain (CARDIA)    Difficulty of Paying Living Expenses: Not hard at all  Food Insecurity: No Food  Insecurity (11/18/2023)   Received from [facility]   Hunger Vital Sign    Within the past 12 months, you worried that your food would run out before you got the money to buy more.: Never true    Within the past 12 months, the food you bought just didn't last and you didn't have money to get more.: Never true  Transportation Needs: No Transportation Needs (11/18/2023)   Received from [facility] - Transportation    Lack of Transportation (Medical): No    Lack of Transportation (Non-Medical): No  Physical Activity: Inactive (10/15/2023)   Exercise Vital Sign    Days of Exercise per Week: 0 days    Minutes of Exercise per Session: 0 min  Stress: No Stress Concern Present  (10/15/2023)   [employer] of Occupational Health - Occupational Stress Questionnaire    Feeling of Stress : Only a little  Social Connections: Moderately Integrated (10/15/2023)   Social Connection and Isolation Panel    Frequency of Communication with Friends and Family: More than three times a week    Frequency of Social Gatherings with Friends and Family: More than three times a week    Attends Religious Services: Never    Database administrator or Organizations: Yes    Attends Engineer, structural: More than 4 times per year    Marital Status: Married   Allergies  Allergen Reactions   Peanuts [Peanut Oil]    Penicillins Other (See Comments)    Hives,rash,nausea,swelling,    Sulfa Antibiotics Other (See Comments)    Hives,rash,nausea,swelling   Corticosteroids     Other Reaction(s): RASH, SWELLING   Influenza Vac Split [Influenza Virus Vaccine]     Pleurisy  1996   Losartan      Reaction: Hypotension   Mobic  [Meloxicam ] Nausea Only    Renal insufficiency   Nickel    Nsaids Other (See Comments)    Decreased gfr    Penicillin G     Other Reaction(s): RASH , SWELLING   Prednisone  Rash   Family History  Problem Relation Age of Onset   Heart disease Mother    Arthritis Mother    Cancer Mother        breast   Hyperlipidemia Mother    Hypertension Mother    Heart attack Mother    Breast cancer Mother 26   Kidney disease Mother    Heart disease Father    Diabetes Father    Arthritis Father    Hypertension Father    Parkinson's disease Father    Hodgkin's lymphoma Father        hodgkins disease, prostate   Heart disease Sister    Diabetes Sister    Diabetes Maternal Aunt    Heart disease Maternal Grandmother    Diabetes Maternal Grandmother    Heart disease Maternal Grandfather    Stroke Maternal Grandfather    Heart disease Paternal Grandmother    Diabetes Paternal Grandmother    Stroke Paternal Grandfather    Heart disease Paternal Grandfather     Diabetes Paternal Grandfather    Breast cancer Cousin        2 pat cousins   Heart disease Brother 46       ami x 8,  4 vessel CABG    Diabetes Brother    Liver disease Brother    Lung disease Brother     Current Outpatient Medications (Endocrine & Metabolic):    progesterone  (PROMETRIUM )  200 MG capsule, Take 200 mg by mouth at bedtime.  Current Outpatient Medications (Cardiovascular):    carvedilol  (COREG ) 6.25 MG tablet, Take 1 tablet (6.25 mg total) by mouth 2 (two) times daily with a meal.   furosemide  (LASIX ) 20 MG tablet, Take 1 tablet (20 mg total) by mouth daily as needed.   nitroGLYCERIN  (NITROSTAT ) 0.4 MG SL tablet, Place 1 tablet (0.4 mg total) under the tongue every 5 (five) minutes as needed for chest pain. DISSOLVE 1 TABLET UNDER THE TONGUE EVERY 5 MINUTES AS  NEEDED FOR CHEST PAIN. MAX  OF 3 TABLETS IN 15 MINUTES. CALL 911 IF PAIN PERSISTS.   rosuvastatin  (CRESTOR ) 10 MG tablet, Take 1 tablet (10 mg total) by mouth daily.  Current Outpatient Medications (Respiratory):    Albuterol -Budesonide (AIRSUPRA ) 90-80 MCG/ACT AERO, Inhale 1 puff into the lungs every 6 (six) hours as needed.  Current Outpatient Medications (Analgesics):    HYDROcodone -acetaminophen  (NORCO/VICODIN) 5-325 MG tablet, Take 1 tablet by mouth 2 (two) times daily as needed for moderate pain (pain score 4-6).   HYDROcodone -acetaminophen  (NORCO/VICODIN) 5-325 MG tablet, Take 1 tablet by mouth 2 (two) times daily as needed for moderate pain (pain score 4-6).   HYDROcodone -acetaminophen  (NORCO/VICODIN) 5-325 MG tablet, Take 1 tablet by mouth 2 (two) times daily as needed for moderate pain (pain score 4-6).   traMADol  (ULTRAM ) 50 MG tablet, Take 1 tablet (50 mg total) by mouth 2 (two) times daily as needed. for pain  Current Outpatient Medications (Hematological):    clopidogrel (PLAVIX) 75 MG tablet, Take 75 mg by mouth daily.   folic acid  (FOLVITE ) 1 MG tablet, Take 1 tablet (1 mg total) by mouth  daily.  Current Outpatient Medications (Other):    amitriptyline  (ELAVIL ) 50 MG tablet, TAKE 1 TABLET BY MOUTH AT BEDTIME   buPROPion  (WELLBUTRIN ) 100 MG tablet, Take 1.5 tablets (150 mg total) by mouth 2 (two) times daily.   Calcium  Carbonate-Vit D-Min (CALCIUM  1200 PO), Take 1,200 mg by mouth daily.   Cholecalciferol (VITAMIN D ) 125 MCG (5000 UT) CAPS, Take 5,000 Units by mouth daily.   gabapentin  (NEURONTIN ) 300 MG capsule, Take 1 capsule (300 mg total) by mouth 3 (three) times daily.   megestrol  (MEGACE ) 20 MG tablet, Take 1 tablet by mouth once daily   omeprazole  (PRILOSEC) 40 MG capsule, Take 1 capsule (40 mg total) by mouth daily.   ondansetron  (ZOFRAN ) 4 MG tablet, Take 1 tablet (4 mg total) by mouth every 8 (eight) hours as needed.   Peppermint Oil (IBGARD) 90 MG CPCR, Take 1 capsule by mouth daily.   Prasterone, DHEA, 10 MG CAPS, Take 10 mg by mouth daily.   rOPINIRole  (REQUIP ) 1 MG tablet, TAKE 1 TABLET BY MOUTH THREE TIMES DAILY   tiZANidine  (ZANAFLEX ) 2 MG tablet, Take 1 tablet (2 mg total) by mouth at bedtime.   venlafaxine  XR (EFFEXOR -XR) 75 MG 24 hr capsule, Take 1 capsule (75 mg total) by mouth daily.   vitamin C  (ASCORBIC ACID ) 500 MG tablet, Take 500 mg by mouth daily.   Reviewed prior external information including notes and imaging from  primary care provider As well as notes that were available from care everywhere and other healthcare systems.  Past medical history, social, surgical and family history all reviewed in electronic medical record.  No pertanent information unless stated regarding to the chief complaint.   Review of Systems:  No headache, visual changes, nausea, vomiting, diarrhea, constipation, dizziness, abdominal pain, skin rash, fevers, chills, night sweats, weight  loss, swollen lymph nodes, body aches, joint swelling, chest pain, shortness of breath, mood changes. POSITIVE muscle aches  Objective  There were no vitals taken for this visit.    General: No apparent distress alert and oriented x3 mood and affect normal, dressed appropriately.  HEENT: Pupils equal, extraocular movements intact  Respiratory: Patient's speak in full sentences and does not appear short of breath  Cardiovascular: No lower extremity edema, non tender, no erythema      Impression and Recommendations:

## 2024-03-03 ENCOUNTER — Ambulatory Visit: Admitting: Family Medicine

## 2024-03-04 ENCOUNTER — Other Ambulatory Visit: Payer: Self-pay | Admitting: Radiology

## 2024-03-04 DIAGNOSIS — D509 Iron deficiency anemia, unspecified: Secondary | ICD-10-CM

## 2024-03-04 NOTE — Progress Notes (Signed)
 Patient for IR Bone Marrow Biopsy on Thurs 03/05/24, I called and spoke with the patient on the phone and gave pre-procedure instructions. Pt was made aware to be here at 7:30a, NPO after MN prior to procedure as well as driver post procedure/recovery/discharge. Pt stated understanding.  Called 03/04/24

## 2024-03-04 NOTE — H&P (Signed)
 Chief Complaint: Patient was seen in consultation today for microcytic anemia /beta thalassemia minor, with consideration for bone marrow biopsy and aspiration.  Referring Provider(s): Dr, Cindy Joe, MD   Supervising Physician: Jenna Hacker  Patient Status: [facility] - Out-pt  Patient is Full Code  History of Present Illness: Cheryl [name] is a 68 y.o. female  with PMHx notable for microcytic anemia, MI, left bundle branch block, neuromuscular disorder, CKD stage III, thyroid  disease, headaches, gastritis, and others as delineated below.  Per Dr. Damaris progress note dated 9/29: Microcytic anemia # Chronic microcytic anemia/history of beta thalassemia  [baseline around 9-10]- out of proportion to her anemia. S/p IV venoferx4 in fall of 2017.   # Currently worsening anemia hemoglobin 7-8-recommend further evaluation with a bone marrow biopsy to look for other causes-especially as patient is quite symptomatic.Discussed with the patient the bone marrow biopsy and aspiration indication and procedure at length.  [...] Bone marrow biopsy/aspiration is ordered.   Interventional Radiology was requested for bone marrow biopsy and aspiration. Patient is scheduled for same in IR today.   Patient is alert and laying in bed, calm.  Patient is currently without any significant complaints other than a positional headache, per baseline, which she qualifies as stabbing neck pain when it occurs.  Patient denies any fevers, chest pain, SOB, cough, abdominal pain, nausea, vomiting or bleeding.     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   Arrhythmia    left bundle branch block   Arthritis    Blood in stool    Chicken pox    Generalized headaches     History of blood transfusion    Kidney disease, chronic, stage III (GFR 30-59 ml/min) (HCC)    LBBB (left bundle branch block)    Neuromuscular disorder (HCC)    Renal disorder    Right lateral epicondylitis 08/13/2017   Thyroid  disease    UTI (lower urinary tract infection)     Past Surgical History:  Procedure Laterality Date   ABDOMINAL HYSTERECTOMY  1997   APPENDECTOMY  1981   BREAST EXCISIONAL BIOPSY Bilateral 1976   neg   BREAST SURGERY  1976   CARDIAC [facility]ATION  [facility]   [facility]   CARDIAC CATHETERIZATION  2006   [facility]   cystic fibrosis tumor removal  1983   RIGHT/LEFT HEART CATH AND CORONARY ANGIOGRAPHY Bilateral 07/15/2023   Procedure: RIGHT/LEFT HEART CATH AND CORONARY ANGIOGRAPHY;  Surgeon: Darron Deatrice LABOR, MD;  Location: [facility] INVASIVE CV LAB;  Service: Cardiovascular;  Laterality: Bilateral;   THUMB AMPUTATION  1992   traumatic   TONSILLECTOMY AND ADENOIDECTOMY  1964   TUBAL LIGATION  1980   VISCERAL ANGIOGRAPHY N/A 06/29/2022   Procedure: VISCERAL ANGIOGRAPHY;  Surgeon: Jama Hacker MATSU, MD;  Location: [facility] INVASIVE CV LAB;  Service: Cardiovascular;  Laterality: N/A;   VISCERAL ANGIOGRAPHY N/A 07/17/2022   Procedure: VISCERAL ANGIOGRAPHY;  Surgeon: Jama Hacker MATSU, MD;  Location: [facility] INVASIVE CV LAB;  Service: Cardiovascular;  Laterality: N/A;    Allergies: Peanuts [peanut oil], Penicillins, Sulfa antibiotics, Corticosteroids, Influenza vac split [influenza virus vaccine], Losartan , Mobic  [meloxicam ], Nickel, Nsaids, Penicillin g, and Prednisone   Medications: Prior to Admission medications   Medication Sig Start Date End Date Taking? Authorizing Provider  Albuterol -Budesonide (AIRSUPRA ) 90-80 MCG/ACT AERO  Inhale 1 puff into the lungs every 6 (six) hours as needed. 08/12/23   Marylynn Verneita CROME, MD  amitriptyline  (ELAVIL ) 50 MG tablet TAKE 1 TABLET BY MOUTH AT BEDTIME 01/01/24   Marylynn Verneita CROME, MD  buPROPion  (WELLBUTRIN ) 100 MG tablet Take 1.5 tablets (150 mg  total) by mouth 2 (two) times daily. 11/28/23   Marylynn Verneita CROME, MD  Calcium  Carbonate-Vit D-Min (CALCIUM  1200 PO) Take 1,200 mg by mouth daily.    [provider]  carvedilol  (COREG ) 6.25 MG tablet Take 1 tablet (6.25 mg total) by mouth 2 (two) times daily with a meal. 06/17/23   Hammock, Sheri, NP  Cholecalciferol (VITAMIN D ) 125 MCG (5000 UT) CAPS Take 5,000 Units by mouth daily.    [provider]  clopidogrel (PLAVIX) 75 MG tablet Take 75 mg by mouth daily.    [provider]  folic acid  (FOLVITE ) 1 MG tablet Take 1 tablet (1 mg total) by mouth daily. 09/18/23   Marylynn Verneita CROME, MD  furosemide  (LASIX ) 20 MG tablet Take 1 tablet (20 mg total) by mouth daily as needed. 08/12/23   Marylynn Verneita CROME, MD  gabapentin  (NEURONTIN ) 300 MG capsule Take 1 capsule (300 mg total) by mouth 3 (three) times daily. 02/18/24   Tullo, Teresa L, MD  HYDROcodone -acetaminophen  (NORCO/VICODIN) 5-325 MG tablet Take 1 tablet by mouth 2 (two) times daily as needed for moderate pain (pain score 4-6). 02/18/24   Tullo, Teresa L, MD  HYDROcodone -acetaminophen  (NORCO/VICODIN) 5-325 MG tablet Take 1 tablet by mouth 2 (two) times daily as needed for moderate pain (pain score 4-6). 02/18/24   Tullo, Teresa L, MD  HYDROcodone -acetaminophen  (NORCO/VICODIN) 5-325 MG tablet Take 1 tablet by mouth 2 (two) times daily as needed for moderate pain (pain score 4-6). 02/18/24   Marylynn Verneita CROME, MD  megestrol  (MEGACE ) 20 MG tablet Take 1 tablet by mouth once daily 02/27/24   Tullo, Teresa L, MD  nitroGLYCERIN  (NITROSTAT ) 0.4 MG SL tablet Place 1 tablet (0.4 mg total) under the tongue every 5 (five) minutes as needed for chest pain. DISSOLVE 1 TABLET UNDER THE TONGUE EVERY 5 MINUTES AS  NEEDED FOR CHEST PAIN. MAX  OF 3 TABLETS IN 15 MINUTES. CALL 911 IF PAIN PERSISTS. 05/08/23   Marylynn Verneita CROME, MD  omeprazole  (PRILOSEC) 40 MG capsule Take 1 capsule (40 mg total) by mouth daily. 02/18/24   Marylynn Verneita CROME, MD  ondansetron   (ZOFRAN ) 4 MG tablet Take 1 tablet (4 mg total) by mouth every 8 (eight) hours as needed. 11/12/23 11/11/24  Marylynn Verneita CROME, MD  Peppermint Oil (IBGARD) 90 MG CPCR Take 1 capsule by mouth daily. 02/18/24   Tullo, Teresa L, MD  Prasterone, DHEA, 10 MG CAPS Take 10 mg by mouth daily.    [provider]  progesterone  (PROMETRIUM ) 200 MG capsule Take 200 mg by mouth at bedtime. 11/20/19   [provider]  rOPINIRole  (REQUIP ) 1 MG tablet TAKE 1 TABLET BY MOUTH THREE TIMES DAILY 02/10/24   Marylynn Verneita CROME, MD  rosuvastatin  (CRESTOR ) 10 MG tablet Take 1 tablet (10 mg total) by mouth daily. 02/18/24   Marylynn Verneita CROME, MD  tiZANidine  (ZANAFLEX ) 2 MG tablet Take 1 tablet (2 mg total) by mouth at bedtime. 11/12/23   Marylynn Verneita CROME, MD  traMADol  (ULTRAM ) 50 MG tablet Take 1 tablet (50 mg total) by mouth 2 (two) times daily as needed. for pain 11/29/23   Marylynn Verneita CROME, MD  venlafaxine  XR (EFFEXOR -XR) 75 MG  24 hr capsule Take 1 capsule (75 mg total) by mouth daily. 11/28/23   Marylynn Verneita CROME, MD  vitamin C  (ASCORBIC ACID ) 500 MG tablet Take 500 mg by mouth daily.    [provider]     Family History  Problem Relation Age of Onset   Heart disease Mother    Arthritis Mother    Cancer Mother        breast   Hyperlipidemia Mother    Hypertension Mother    Heart attack Mother    Breast cancer Mother 58   Kidney disease Mother    Heart disease Father    Diabetes Father    Arthritis Father    Hypertension Father    Parkinson's disease Father    Hodgkin's lymphoma Father        hodgkins disease, prostate   Heart disease Sister    Diabetes Sister    Diabetes Maternal Aunt    Heart disease Maternal Grandmother    Diabetes Maternal Grandmother    Heart disease Maternal Grandfather    Stroke Maternal Grandfather    Heart disease Paternal Grandmother    Diabetes Paternal Grandmother    Stroke Paternal Grandfather    Heart disease Paternal Grandfather    Diabetes Paternal  Grandfather    Breast cancer Cousin        2 pat cousins   Heart disease Brother 74       ami x 8,  4 vessel CABG    Diabetes Brother    Liver disease Brother    Lung disease Brother     Social History   Socioeconomic History   Marital status: Married    Spouse name: Not on file   Number of children: 3   Years of education: Not on file   Highest education level: Not on file  Occupational History   Occupation: Retired  Tobacco Use   Smoking status: Every Day    Current packs/day: 0.25    Average packs/day: 1 pack/day for 44.9 years (44.3 ttl pk-yrs)    Types: Cigarettes    Start date: 04/11/1979   Smokeless tobacco: Never   Tobacco comments:    3-4 cigarettes daily, trying to cut back.  Vaping Use   Vaping status: Never Used  Substance and Sexual Activity   Alcohol use: Not Currently    Comment: occasional   Drug use: No   Sexual activity: Not on file  Other Topics Concern   Not on file  Social History Narrative   Married to [employer]; Works for [employer], Engineer, structural. Quit smoking 2018; ocassional alcohol; lives near to [place].    Social Drivers of Health   Financial Resource Strain: Low Risk  (10/15/2023)   Overall Financial Resource Strain (CARDIA)    Difficulty of Paying Living Expenses: Not hard at all  Food Insecurity: No Food Insecurity (11/18/2023)   Received from [facility]   Hunger Vital Sign    Within the past 12 months, you worried that your food would run out before you got the money to buy more.: Never true    Within the past 12 months, the food you bought just didn't last and you didn't have money to get more.: Never true  Transportation Needs: No Transportation Needs (11/18/2023)   Received from [facility] - Transportation    Lack of Transportation (Medical): No    Lack of Transportation (Non-Medical): No  Physical Activity: Inactive (10/15/2023)   Exercise Vital  Sign    Days of Exercise per Week: 0 days    Minutes  of Exercise per Session: 0 min  Stress: No Stress Concern Present (10/15/2023)   [employer] of Occupational Health - Occupational Stress Questionnaire    Feeling of Stress : Only a little  Social Connections: Moderately Integrated (10/15/2023)   Social Connection and Isolation Panel    Frequency of Communication with Friends and Family: More than three times a week    Frequency of Social Gatherings with Friends and Family: More than three times a week    Attends Religious Services: Never    Database administrator or Organizations: Yes    Attends Engineer, structural: More than 4 times per year    Marital Status: Married     Review of Systems: A 12 point ROS discussed and pertinent positives are indicated in the HPI above.  All other systems are negative.  Vital Signs: BP 126/64   Pulse 81   Temp 98.4 F (36.9 C) (Oral)   Resp 18   SpO2 99%   Advance Care Plan: The advanced care place/surrogate decision maker was discussed at the time of visit and the patient did not wish to discuss or was not able to name a surrogate decision maker or provide an advance care plan.  Physical Exam Constitutional:      General: She is not in acute distress.    Appearance: Normal appearance.  HENT:     Mouth/Throat:     Mouth: Mucous membranes are dry.  Cardiovascular:     Rate and Rhythm: Normal rate and regular rhythm.     Pulses: Normal pulses.     Heart sounds: Normal heart sounds.  Pulmonary:     Effort: Pulmonary effort is normal.     Breath sounds: Normal breath sounds.  Musculoskeletal:        General: Normal range of motion.     Cervical back: Normal range of motion.  Skin:    General: Skin is warm and dry.  Neurological:     Mental Status: She is alert and oriented to person, place, and time.  Psychiatric:        Mood and Affect: Mood normal.        Behavior: Behavior normal.        Thought Content: Thought content normal.        Judgment: Judgment normal.      Imaging: No results found.  Labs:  CBC: Recent Labs    10/28/23 1816 02/18/24 1356 02/24/24 1301 03/05/24 0824  WBC 8.5 6.3 6.3 10.9*  HGB 10.3* 7.5 Repeated and verified X2.* 7.9* 9.2*  HCT 33.6* 23.7 Repeated and verified X2.* 26.1* 29.4*  PLT 265 237.0 211 228    COAGS: No results for input(s): INR, APTT in the last 8760 hours.  BMP: Recent Labs    06/24/23 1427 07/09/23 1415 10/24/23 1307 10/28/23 1816 11/12/23 1453 02/18/24 1356 02/24/24 1301  NA 137   < > 138 140 137 139 137  K 4.1   < > 4.3 4.0 5.0 3.7 4.2  CL 104   < > 107 105 107 110 111  CO2 23   < > 24 23 24 22  21*  GLUCOSE 96   < > 94 102* 81 139* 92  BUN 14   < > 14 10 15 11 12   CALCIUM  9.2   < > 8.3* 9.7 8.7 8.5 8.6*  CREATININE 0.96   < > 0.73  0.77 0.97 0.89 0.94  GFRNONAA >60  --  >60 >60  --   --  >60   < > = values in this interval not displayed.    LIVER FUNCTION TESTS: Recent Labs    06/24/23 1427 10/28/23 1816 11/12/23 1453 02/18/24 1356  BILITOT 1.1 1.6* 1.1 0.9  AST 18 20 19 11   ALT 20 17 17 8   ALKPHOS 80 98 70 57  PROT 7.0 8.1 6.3 5.7*  ALBUMIN 4.6 5.1* 4.4 3.9    TUMOR MARKERS: No results for input(s): AFPTM, CEA, CA199, CHROMGRNA in the last 8760 hours.  Assessment and Plan: Per Dr. Damaris progress note dated 9/29: Microcytic anemia # Chronic microcytic anemia/history of beta thalassemia  [baseline around 9-10]- out of proportion to her anemia. S/p IV venoferx4 in fall of 2017.   # Currently worsening anemia hemoglobin 7-8-recommend further evaluation with a bone marrow biopsy to look for other causes-especially as patient is quite symptomatic.Discussed with the patient the bone marrow biopsy and aspiration indication and procedure at length.  [...] Bone marrow biopsy/aspiration is ordered.  Patient presents for scheduled bone marrow biopsy and aspiration in IR today.  Patient has been NPO since midnight.  All labs and medications are within  acceptable parameters.  Allergies reviewed: Peanuts, penicillin, sulfa antibiotics, corticosteroids, influenza vaccine, losartan , meloxicam , nickel, NSAIDs, prednisone .  Risks and benefits of bone marrow biopsy and aspiration was discussed with the patient and/or patient's family including, but not limited to bleeding, infection, damage to adjacent structures or low yield requiring additional tests.  All of the questions were answered and there is agreement to proceed.  Consent signed and in chart.    Thank you for allowing our service to participate in Cheryl [name] 's care.  Electronically Signed: Carlin DELENA Griffon, PA-C   03/05/2024, 8:39 AM      I spent a total of 30 Minutes in face to face in clinical consultation, greater than 50% of which was counseling/coordinating care for microcytic anemia /beta thalassemia minor, with consideration for bone marrow biopsy and aspiration.

## 2024-03-05 ENCOUNTER — Encounter: Payer: Self-pay | Admitting: Radiology

## 2024-03-05 ENCOUNTER — Ambulatory Visit
Admission: RE | Admit: 2024-03-05 | Discharge: 2024-03-05 | Disposition: A | Discharge status: Home / Self Care | Source: Ambulatory Visit | Attending: Internal Medicine | Admitting: Internal Medicine

## 2024-03-05 ENCOUNTER — Other Ambulatory Visit: Payer: Self-pay

## 2024-03-05 DIAGNOSIS — N183 Chronic kidney disease, stage 3 unspecified: Secondary | ICD-10-CM | POA: Diagnosis not present

## 2024-03-05 DIAGNOSIS — I447 Left bundle-branch block, unspecified: Secondary | ICD-10-CM | POA: Diagnosis not present

## 2024-03-05 DIAGNOSIS — Z1379 Encounter for other screening for genetic and chromosomal anomalies: Secondary | ICD-10-CM | POA: Diagnosis not present

## 2024-03-05 DIAGNOSIS — Z79899 Other long term (current) drug therapy: Secondary | ICD-10-CM | POA: Diagnosis not present

## 2024-03-05 DIAGNOSIS — G709 Myoneural disorder, unspecified: Secondary | ICD-10-CM | POA: Insufficient documentation

## 2024-03-05 DIAGNOSIS — I252 Old myocardial infarction: Secondary | ICD-10-CM | POA: Diagnosis not present

## 2024-03-05 DIAGNOSIS — D509 Iron deficiency anemia, unspecified: Secondary | ICD-10-CM | POA: Diagnosis not present

## 2024-03-05 DIAGNOSIS — D563 Thalassemia minor: Secondary | ICD-10-CM | POA: Diagnosis not present

## 2024-03-05 DIAGNOSIS — F17210 Nicotine dependence, cigarettes, uncomplicated: Secondary | ICD-10-CM | POA: Diagnosis not present

## 2024-03-05 DIAGNOSIS — D72829 Elevated white blood cell count, unspecified: Secondary | ICD-10-CM | POA: Diagnosis not present

## 2024-03-05 DIAGNOSIS — E079 Disorder of thyroid, unspecified: Secondary | ICD-10-CM | POA: Diagnosis not present

## 2024-03-05 HISTORY — PX: IR BONE MARROW BIOPSY & ASPIRATION: IMG5727

## 2024-03-05 LAB — CBC WITH DIFFERENTIAL/PLATELET
Abs Immature Granulocytes: 0.06 K/uL (ref 0.00–0.07)
Basophils Absolute: 0 K/uL (ref 0.0–0.1)
Basophils Relative: 0 %
Eosinophils Absolute: 0.2 K/uL (ref 0.0–0.5)
Eosinophils Relative: 2 %
HCT: 29.4 % — ABNORMAL LOW (ref 36.0–46.0)
Hemoglobin: 9.2 g/dL — ABNORMAL LOW (ref 12.0–15.0)
Immature Granulocytes: 1 %
Lymphocytes Relative: 15 %
Lymphs Abs: 1.6 K/uL (ref 0.7–4.0)
MCH: 22.9 pg — ABNORMAL LOW (ref 26.0–34.0)
MCHC: 31.3 g/dL (ref 30.0–36.0)
MCV: 73.1 fL — ABNORMAL LOW (ref 80.0–100.0)
Monocytes Absolute: 1.1 K/uL — ABNORMAL HIGH (ref 0.1–1.0)
Monocytes Relative: 10 %
Neutro Abs: 7.9 K/uL — ABNORMAL HIGH (ref 1.7–7.7)
Neutrophils Relative %: 72 %
Platelets: 228 K/uL (ref 150–400)
RBC: 4.02 MIL/uL (ref 3.87–5.11)
RDW: 19.9 % — ABNORMAL HIGH (ref 11.5–15.5)
WBC: 10.9 K/uL — ABNORMAL HIGH (ref 4.0–10.5)
nRBC: 0 % (ref 0.0–0.2)

## 2024-03-05 MED ORDER — SODIUM CHLORIDE 0.9 % IV SOLN: INTRAVENOUS | Status: DC

## 2024-03-05 MED ORDER — HEPARIN SOD (PORK) LOCK FLUSH 100 UNIT/ML IV SOLN
500.0000 [IU] | Freq: Once | INTRAVENOUS | Status: AC
  Administered 2024-03-05: 200 [IU] via INTRAVENOUS

## 2024-03-05 MED ORDER — MIDAZOLAM HCL 2 MG/2ML IJ SOLN
INTRAMUSCULAR | Status: AC | PRN
  Administered 2024-03-05 (×2): .5 mg via INTRAVENOUS
  Administered 2024-03-05: 1 mg via INTRAVENOUS

## 2024-03-05 MED ORDER — FENTANYL CITRATE (PF) 100 MCG/2ML IJ SOLN
INTRAMUSCULAR | Status: AC
  Filled 2024-03-05: qty 2

## 2024-03-05 MED ORDER — MIDAZOLAM HCL 2 MG/2ML IJ SOLN
INTRAMUSCULAR | Status: AC
  Filled 2024-03-05: qty 2

## 2024-03-05 MED ORDER — LIDOCAINE 1 % OPTIME INJ - NO CHARGE
10.0000 mL | Freq: Once | INTRAMUSCULAR | Status: AC
  Administered 2024-03-05: 10 mL
  Filled 2024-03-05: qty 10

## 2024-03-05 MED ORDER — HEPARIN SOD (PORK) LOCK FLUSH 100 UNIT/ML IV SOLN
INTRAVENOUS | Status: AC
  Filled 2024-03-05: qty 5

## 2024-03-05 MED ORDER — FENTANYL CITRATE (PF) 100 MCG/2ML IJ SOLN
INTRAMUSCULAR | Status: AC | PRN
  Administered 2024-03-05: 50 ug via INTRAVENOUS
  Administered 2024-03-05 (×2): 25 ug via INTRAVENOUS

## 2024-03-05 NOTE — CV Procedure (Signed)
 Pre procedural Dx:  microcytic anemia /beta thalassemia minor   Post procedural Dx: Same  Technically successful fluoro guided biopsy of left iliac bone marrow.   EBL: None.   Complications: None immediate.   KANDICE Banner, MD Pager #: [phone]

## 2024-03-10 LAB — SURGICAL PATHOLOGY

## 2024-03-13 ENCOUNTER — Encounter (HOSPITAL_COMMUNITY): Payer: Self-pay | Admitting: Internal Medicine

## 2024-03-13 ENCOUNTER — Encounter: Payer: Self-pay | Admitting: Family Medicine

## 2024-03-16 ENCOUNTER — Telehealth: Payer: Self-pay | Admitting: *Deleted

## 2024-03-16 ENCOUNTER — Encounter: Payer: Self-pay | Admitting: Nurse Practitioner

## 2024-03-16 ENCOUNTER — Inpatient Hospital Stay: Attending: Internal Medicine

## 2024-03-16 ENCOUNTER — Inpatient Hospital Stay (HOSPITAL_BASED_OUTPATIENT_CLINIC_OR_DEPARTMENT_OTHER): Admitting: Nurse Practitioner

## 2024-03-16 ENCOUNTER — Inpatient Hospital Stay

## 2024-03-16 VITALS — BP 123/73 | HR 79 | Temp 98.6°F | Resp 20 | Wt 130.4 lb

## 2024-03-16 DIAGNOSIS — E538 Deficiency of other specified B group vitamins: Secondary | ICD-10-CM | POA: Diagnosis not present

## 2024-03-16 DIAGNOSIS — D509 Iron deficiency anemia, unspecified: Secondary | ICD-10-CM | POA: Insufficient documentation

## 2024-03-16 DIAGNOSIS — D563 Thalassemia minor: Secondary | ICD-10-CM

## 2024-03-16 DIAGNOSIS — D508 Other iron deficiency anemias: Secondary | ICD-10-CM

## 2024-03-16 LAB — CBC WITH DIFFERENTIAL (CANCER CENTER ONLY)
Abs Immature Granulocytes: 0.03 K/uL (ref 0.00–0.07)
Basophils Absolute: 0.1 K/uL (ref 0.0–0.1)
Basophils Relative: 1 %
Eosinophils Absolute: 0.1 K/uL (ref 0.0–0.5)
Eosinophils Relative: 2 %
HCT: 29.3 % — ABNORMAL LOW (ref 36.0–46.0)
Hemoglobin: 9.4 g/dL — ABNORMAL LOW (ref 12.0–15.0)
Immature Granulocytes: 0 %
Lymphocytes Relative: 25 %
Lymphs Abs: 1.7 K/uL (ref 0.7–4.0)
MCH: 23 pg — ABNORMAL LOW (ref 26.0–34.0)
MCHC: 32.1 g/dL (ref 30.0–36.0)
MCV: 71.8 fL — ABNORMAL LOW (ref 80.0–100.0)
Monocytes Absolute: 0.6 K/uL (ref 0.1–1.0)
Monocytes Relative: 9 %
Neutro Abs: 4.2 K/uL (ref 1.7–7.7)
Neutrophils Relative %: 63 %
Platelet Count: 298 K/uL (ref 150–400)
RBC: 4.08 MIL/uL (ref 3.87–5.11)
RDW: 20.7 % — ABNORMAL HIGH (ref 11.5–15.5)
WBC Count: 6.7 K/uL (ref 4.0–10.5)
nRBC: 0.3 % — ABNORMAL HIGH (ref 0.0–0.2)

## 2024-03-16 LAB — SAMPLE TO BLOOD BANK

## 2024-03-16 LAB — BASIC METABOLIC PANEL WITH GFR
Anion gap: 7 (ref 5–15)
BUN: 12 mg/dL (ref 8–23)
CO2: 22 mmol/L (ref 22–32)
Calcium: 8.9 mg/dL (ref 8.9–10.3)
Chloride: 108 mmol/L (ref 98–111)
Creatinine, Ser: 0.95 mg/dL (ref 0.44–1.00)
GFR, Estimated: >60 mL/min (ref >60)
Glucose, Bld: 129 mg/dL — ABNORMAL HIGH (ref 70–99)
Potassium: 4.4 mmol/L (ref 3.5–5.1)
Sodium: 137 mmol/L (ref 135–145)

## 2024-03-16 MED ORDER — CYANOCOBALAMIN 1000 MCG/ML IJ SOLN
1000.0000 ug | Freq: Once | INTRAMUSCULAR | Status: AC
  Administered 2024-03-16: 1000 ug via INTRAMUSCULAR

## 2024-03-16 NOTE — Progress Notes (Signed)
 [name] CONSULT NOTE  Patient Care Team: Cheryl Verneita CROME, MD as PCP - General (Internal Medicine) Cheryl Evalene PARAS, MD as PCP - Cardiology (Cardiology) Cheryl Reyes ORN, MD as Consulting Physician (General Surgery) Cheryl Cindy SAUNDERS, MD as Consulting Physician (Oncology) Cheryl Dedra CROME, MD as Consulting Physician (Pulmonary Disease)  CHIEF COMPLAINTS/PURPOSE OF CONSULTATION: Anemia.  HEMATOLOGY HISTORY  # CHRONIC MICROCYTIC ANEMIA- BETA THALASSEMIA MINOR [colo- 2016; Dr. Jerold July 2017- IV trial of Venofer  x4   # Lung nodules [CT- sub cm Jan 2017]-2019 CT scan stable.  Benign.  HISTORY OF PRESENTING ILLNESS: Alone. Walking independently.   Cheryl [name] 68 y.o. female with a history of beta thalassemia minor; also iron  deficiency anemia/B12 deficiency is here for follow-up. She was previously noted to have worsening anemia and to review bone marrow biopsy results.  C/o no energy however reports I have adjusted to it and take short naps throughout the day.  No headaches reported today.  Patient is currently retired.  Patient has chronic mild fatigue.  Intermittent dizziness.  Denies any shortness of breath or cough.  She denies any blood in stools or black or stools.  Appetite is good.  Some weight loss noted without symptoms.  Review of Systems  Constitutional:  Positive for weight loss. Negative for chills, diaphoresis and fever.  HENT:  Negative for nosebleeds and sore throat.   Eyes:  Negative for double vision.  Respiratory:  Negative for cough, hemoptysis, sputum production and wheezing.   Cardiovascular:  Negative for chest pain, palpitations, orthopnea and leg swelling.  Gastrointestinal:  Negative for abdominal pain, blood in stool, constipation, diarrhea, heartburn, melena, nausea and vomiting.  Genitourinary:  Negative for dysuria, frequency and urgency.  Musculoskeletal:  Negative for back pain and joint pain.  Skin: Negative.   Negative for itching and rash.  Neurological:  Negative for dizziness, tingling, focal weakness, weakness and headaches.  Endo/Heme/Allergies:  Does not bruise/bleed easily.  Psychiatric/Behavioral:  Negative for depression. The patient is not nervous/anxious and does not have insomnia.    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   Arrhythmia    left bundle branch block   Arthritis    Blood in stool    Chicken pox    Generalized headaches    History of blood transfusion    Kidney disease, chronic, stage III (GFR 30-59 ml/min) (HCC)    LBBB (left bundle branch block)    Neuromuscular disorder (HCC)    Renal disorder    Right lateral epicondylitis 08/13/2017   Thyroid  disease    UTI (lower urinary tract infection)    SURGICAL HISTORY: Past Surgical History:  Procedure Laterality Date   ABDOMINAL HYSTERECTOMY  1997   APPENDECTOMY  1981   BREAST EXCISIONAL BIOPSY Bilateral 1976   neg   BREAST SURGERY  1976   CARDIAC CATHETERIZATION  2004   [name]   CARDIAC CATHETERIZATION  2006   [facility]   cystic fibrosis tumor removal  1983   IR BONE MARROW BIOPSY & ASPIRATION  03/05/2024   RIGHT/LEFT HEART CATH AND CORONARY ANGIOGRAPHY Bilateral 07/15/2023   Procedure: RIGHT/LEFT HEART CATH AND CORONARY ANGIOGRAPHY;  Surgeon: Darron Deatrice LABOR, MD;  Location: [facility] INVASIVE CV LAB;  Service: Cardiovascular;  Laterality: Bilateral;   THUMB AMPUTATION  1992   traumatic   TONSILLECTOMY AND ADENOIDECTOMY  1964   TUBAL LIGATION  1980   VISCERAL ANGIOGRAPHY N/A 06/29/2022   Procedure: VISCERAL ANGIOGRAPHY;  Surgeon: Jama Cordella MATSU, MD;  Location: [facility] INVASIVE CV LAB;  Service: Cardiovascular;  Laterality: N/A;   VISCERAL ANGIOGRAPHY N/A 07/17/2022   Procedure:  VISCERAL ANGIOGRAPHY;  Surgeon: Jama Cordella MATSU, MD;  Location: [facility] INVASIVE CV LAB;  Service: Cardiovascular;  Laterality: N/A;   SOCIAL HISTORY: Social History   Socioeconomic History   Marital status: Married    Spouse name: Not on file   Number of children: 3   Years of education: Not on file   Highest education level: Not on file  Occupational History   Occupation: Retired  Tobacco Use   Smoking status: Every Day    Current packs/day: 0.25    Average packs/day: 1 pack/day for 44.9 years (44.3 ttl pk-yrs)    Types: Cigarettes    Start date: 04/11/1979   Smokeless tobacco: Never   Tobacco comments:    3-4 cigarettes daily, trying to cut back.  Vaping Use   Vaping status: Never Used  Substance and Sexual Activity   Alcohol use: Not Currently    Comment: occasional   Drug use: No   Sexual activity: Not on file  Other Topics Concern   Not on file  Social History Narrative   Married to [employer]; Works for [employer], engineer, structural. Quit smoking 2018; ocassional alcohol; lives near to [place].    Social Drivers of Health   Financial Resource Strain: Low Risk  (10/15/2023)   Overall Financial Resource Strain (CARDIA)    Difficulty of Paying Living Expenses: Not hard at all  Food Insecurity: No Food Insecurity (11/18/2023)   [facility]   Hunger Vital Sign    Within the past 12 months, you worried that your food would run out before you got the money to buy more.: Never true    Within the past 12 months, the food you bought just didn't last and you didn't have money to get more.: Never true  Transportation Needs: No Transportation Needs (11/18/2023)   Received from [facility] - Transportation    Lack of Transportation (Medical): No    Lack of Transportation (Non-Medical): No  Physical Activity: Inactive (10/15/2023)   Exercise Vital Sign    Days of Exercise per Week: 0 days    Minutes of Exercise per Session: 0 min  Stress: No  Stress Concern Present (10/15/2023)   [employer] of [employer]ational [employer] - Occupational Stress Questionnaire    Feeling of Stress : Only a little  Social Connections: Moderately Integrated (10/15/2023)   Social Connection and Isolation Panel    Frequency of Communication with Friends and Family: More than three times a week    Frequency of Social Gatherings with Friends and Family: More than three times a week    Attends Religious Services: Never    Database Administrator or Organizations: Yes    Attends Engineer, Structural: More than 4 times per year    Marital Status: Married  Catering Manager Violence: Not At Risk (01/16/2024)   Received from [name]   Humiliation, Afraid, Rape, and Kick questionnaire    Within the last year, have you been afraid of your partner or ex-partner?: No    Within the last year, have you been humiliated or emotionally abused in other ways by your partner or ex-partner?:  No    Within the last year, have you been kicked, hit, slapped, or otherwise physically hurt by your partner or ex-partner?: No    Within the last year, have you been raped or forced to have any kind of sexual activity by your partner or ex-partner?: No   FAMILY HISTORY: Family History  Problem Relation Age of Onset   Heart disease Mother    Arthritis Mother    Cancer Mother        breast   Hyperlipidemia Mother    Hypertension Mother    Heart attack Mother    Breast cancer Mother 36   Kidney disease Mother    Heart disease Father    Diabetes Father    Arthritis Father    Hypertension Father    Parkinson's disease Father    Hodgkin's lymphoma Father        hodgkins disease, prostate   Heart disease Sister    Diabetes Sister    Diabetes Maternal Aunt    Heart disease Maternal Grandmother    Diabetes Maternal Grandmother    Heart disease Maternal Grandfather    Stroke Maternal Grandfather    Heart disease Paternal Grandmother    Diabetes Paternal  Grandmother    Stroke Paternal Grandfather    Heart disease Paternal Grandfather    Diabetes Paternal Grandfather    Breast cancer Cousin        2 pat cousins   Heart disease Brother 25       ami x 8,  4 vessel CABG    Diabetes Brother    Liver disease Brother    Lung disease Brother    ALLERGIES:  is allergic to peanuts [peanut oil], penicillins, sulfa antibiotics, corticosteroids, influenza vac split [influenza virus vaccine], losartan , mobic  [meloxicam ], nickel, nsaids, penicillin g, and prednisone .  MEDICATIONS:  Current Outpatient Medications  Medication Sig Dispense Refill   Albuterol -Budesonide (AIRSUPRA ) 90-80 MCG/ACT AERO Inhale 1 puff into the lungs every 6 (six) hours as needed. 5.9 g 11   amitriptyline  (ELAVIL ) 50 MG tablet TAKE 1 TABLET BY MOUTH AT BEDTIME 90 tablet 1   buPROPion  (WELLBUTRIN ) 100 MG tablet Take 1.5 tablets (150 mg total) by mouth 2 (two) times daily. 90 tablet 1   Calcium  Carbonate-Vit D-Min (CALCIUM  1200 PO) Take 1,200 mg by mouth daily.     carvedilol  (COREG ) 6.25 MG tablet Take 1 tablet (6.25 mg total) by mouth 2 (two) times daily with a meal. 180 tablet 3   Cholecalciferol (VITAMIN D ) 125 MCG (5000 UT) CAPS Take 5,000 Units by mouth daily.     clopidogrel  (PLAVIX ) 75 MG tablet Take 75 mg by mouth daily.     folic acid  (FOLVITE ) 1 MG tablet Take 1 tablet (1 mg total) by mouth daily. 90 tablet 3   furosemide  (LASIX ) 20 MG tablet Take 1 tablet (20 mg total) by mouth daily as needed. 30 tablet 3   gabapentin  (NEURONTIN ) 300 MG capsule Take 1 capsule (300 mg total) by mouth 3 (three) times daily. 270 capsule 1   HYDROcodone -acetaminophen  (NORCO/VICODIN) 5-325 MG tablet Take 1 tablet by mouth 2 (two) times daily as needed for moderate pain (pain score 4-6). 60 tablet 0   HYDROcodone -acetaminophen  (NORCO/VICODIN) 5-325 MG tablet Take 1 tablet by mouth 2 (two) times daily as needed for moderate pain (pain score 4-6). 60 tablet 0   HYDROcodone -acetaminophen   (NORCO/VICODIN) 5-325 MG tablet Take 1 tablet by mouth 2 (two) times daily as needed for moderate pain (pain score 4-6).  60 tablet 0   megestrol  (MEGACE ) 20 MG tablet Take 1 tablet by mouth once daily 30 tablet 1   nitroGLYCERIN  (NITROSTAT ) 0.4 MG SL tablet Place 1 tablet (0.4 mg total) under the tongue every 5 (five) minutes as needed for chest pain. DISSOLVE 1 TABLET UNDER THE TONGUE EVERY 5 MINUTES AS  NEEDED FOR CHEST PAIN. MAX  OF 3 TABLETS IN 15 MINUTES. CALL 911 IF PAIN PERSISTS. 100 tablet 3   omeprazole  (PRILOSEC) 40 MG capsule Take 1 capsule (40 mg total) by mouth daily. 90 capsule 1   ondansetron  (ZOFRAN ) 4 MG tablet Take 1 tablet (4 mg total) by mouth every 8 (eight) hours as needed. 60 tablet 2   Peppermint Oil (IBGARD) 90 MG CPCR Take 1 capsule by mouth daily.     Prasterone, DHEA, 10 MG CAPS Take 10 mg by mouth daily.     progesterone  (PROMETRIUM ) 200 MG capsule Take 200 mg by mouth at bedtime.     rOPINIRole  (REQUIP ) 1 MG tablet TAKE 1 TABLET BY MOUTH THREE TIMES DAILY 90 tablet 1   rosuvastatin  (CRESTOR ) 10 MG tablet Take 1 tablet (10 mg total) by mouth daily. 90 tablet 1   tiZANidine  (ZANAFLEX ) 2 MG tablet Take 1 tablet (2 mg total) by mouth at bedtime. 90 tablet 1   traMADol  (ULTRAM ) 50 MG tablet Take 1 tablet (50 mg total) by mouth 2 (two) times daily as needed. for pain 60 tablet 2   venlafaxine  XR (EFFEXOR -XR) 75 MG 24 hr capsule Take 1 capsule (75 mg total) by mouth daily. 90 capsule 1   vitamin C  (ASCORBIC ACID ) 500 MG tablet Take 500 mg by mouth daily.     No current facility-administered medications for this visit.   PHYSICAL EXAMINATION: Vitals:   03/16/24 1032  BP: 123/73  Pulse: 79  Resp: 20  Temp: 98.6 F (37 C)  SpO2: 100%   Filed Weights   03/16/24 1032  Weight: 130 lb 6.4 oz (59.1 kg)   Physical Exam HENT:     Head: Normocephalic and atraumatic.     Mouth/Throat:     Pharynx: No oropharyngeal exudate.  Eyes:     Pupils: Pupils are equal, round,  and reactive to light.  Cardiovascular:     Rate and Rhythm: Normal rate and regular rhythm.  Pulmonary:     Effort: Pulmonary effort is normal. No respiratory distress.     Breath sounds: Normal breath sounds. No wheezing.  Abdominal:     General: Bowel sounds are normal. There is no distension.     Palpations: Abdomen is soft. There is no mass.     Tenderness: There is no abdominal tenderness. There is no guarding or rebound.  Musculoskeletal:        General: No tenderness. Normal range of motion.     Cervical back: Normal range of motion and neck supple.  Skin:    General: Skin is warm.  Neurological:     Mental Status: She is alert and oriented to person, place, and time.  Psychiatric:        Mood and Affect: Affect normal.    LABORATORY DATA:  I have reviewed the data as listed Lab Results  Component Value Date   WBC 6.7 03/16/2024   HGB 9.4 (L) 03/16/2024   HCT 29.3 (L) 03/16/2024   MCV 71.8 (L) 03/16/2024   PLT 298 03/16/2024   Recent Labs    05/14/23 1505 06/24/23 1427 10/28/23 1816 11/12/23 1453 02/18/24 1356 02/24/24  1301 03/16/24 1009  NA  --    < > 140 137 139 137 137  K  --    < > 4.0 5.0 3.7 4.2 4.4  CL  --    < > 105 107 110 111 108  CO2  --    < > 23 24 22  21* 22  GLUCOSE  --    < > 102* 81 139* 92 129*  BUN  --    < > 10 15 11 12 12   CREATININE  --    < > 0.77 0.97 0.89 0.94 0.95  CALCIUM   --    < > 9.7 8.7 8.5 8.6* 8.9  GFRNONAA  --    < > >60  --   --  >60 >60  PROT 6.8   < > 8.1 6.3 5.7*  --   --   ALBUMIN 4.5   < > 5.1* 4.4 3.9  --   --   AST 18   < > 20 19 11   --   --   ALT 22   < > 17 17 8   --   --   ALKPHOS 90   < > 98 70 57  --   --   BILITOT 0.9   < > 1.6* 1.1 0.9  --   --   BILIDIR 0.2  --   --  0.2  --   --   --    < > = values in this interval not displayed.   IR BONE MARROW BIOPSY & ASPIRATION Result Date: 03/05/2024 CLINICAL DATA:  Microcytic anemia/beta thalassemia minor. EXAM: Fluoro guided bone marrow biopsy TECHNIQUE:  Fluoro pelvis CONTRAST:  None RADIOPHARMACEUTICALS:  None FLUOROSCOPY: 46.8 mGy COMPARISON:  None FINDINGS: The patient was placed in prone position on the IR table. Radiopaque markers were placed on the patient's skin and initial imaging of the pelvis was performed. The patient's skin was then prepped and draped in the usual sterile fashion. Moderate sedation was provided for by the nursing staff under my supervision utilizing intravenous Versed  and fentanyl . The nurse had no other duties other than monitoring the patient and providing sedation during the procedure. I was present for the entire procedure. 2 mg intravenous Versed  and 100 mcg of intravenous fentanyl  were administered by the nursing staff for total sedation time of 16 minutes 1% lidocaine  was used to infiltrate the skin at the access site prior to a stab incision. Local anesthesia was then used to infiltrate the region of soft tissue from the skin to the left iliac bone. The bone marrow needle was then advanced and imaging demonstrated the needle tip to be in the cortex of the left iliac bone. The bone was then penetrated and a sample was obtained. After the sample was evaluated, approximately 5 mL of heparinized bone marrow sample was obtained by aspiration. A core sample was then obtained. Multiple attempts at sampling was performed in order to get 2 1 cm segments. All needles were then removed from the patient. Sterile dressing was applied. IMPRESSION: Satisfactory core needle biopsy and aspiration of the left iliac bone marrow under fluoro guidance. Electronically Signed   By: Cordella Banner   On: 03/05/2024 10:15    ASSESSMENT & PLAN:  Microcytic anemia # Chronic microcytic anemia/history of beta thalassemia  [baseline around 9-10]- out of proportion to her anemia. S/p IV venofer  x 4 in fall of 2017.   # Currently worsening anemia hemoglobin 7-8-recommended further evaluation with a bone  marrow biopsy to look for other causes,  particularly given symptoms. Bone marrow biopsy/aspiration completed no significant findings, shows Hypercellular bone marrow (50%) with erythroid predominant trilineage, no blasts. Recommend adding myeloid NGS panel.    # Hb  today- 9.4. Hold transfusion today. 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 Discussed with blood bank.  Vitamin B12 level 340 in September 2025 ordered vitamin B12 injection today.   # PVD- status post abdominal stenting.  # Weight loss- some ongoing weight loss today with weight at 130 today continue to monitor.  No changes in appetite or bowel habits reported   # Disposition  B12 today. Add intelligen myeloid panel No transfusion 3 weeks- labs (cbc, bmp, hold tube), Dr Cheryl, +/- b12, D2 possible transfusion- la  No problem-specific Assessment & Plan notes found for this encounter.  Tinnie Dawn, DNP, AGNP-C, AOCNP [name] [phone] (clinic)

## 2024-03-16 NOTE — Progress Notes (Signed)
 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

## 2024-03-16 NOTE — Telephone Encounter (Signed)
 Spoke with Alan  at [facility] to add myeloid NGS testing to recent bone Marrow Biopsy.

## 2024-03-17 ENCOUNTER — Inpatient Hospital Stay

## 2024-03-27 ENCOUNTER — Encounter (HOSPITAL_COMMUNITY): Payer: Self-pay | Admitting: Internal Medicine

## 2024-03-29 ENCOUNTER — Telehealth: Payer: Self-pay | Admitting: Internal Medicine

## 2024-03-29 NOTE — Telephone Encounter (Signed)
 Please move this patient from Lauren schedule to mine - [facility]

## 2024-04-06 ENCOUNTER — Inpatient Hospital Stay: Admitting: Nurse Practitioner

## 2024-04-06 ENCOUNTER — Inpatient Hospital Stay

## 2024-04-07 ENCOUNTER — Other Ambulatory Visit: Payer: Self-pay | Admitting: Internal Medicine

## 2024-04-07 ENCOUNTER — Telehealth: Payer: Self-pay | Admitting: *Deleted

## 2024-04-07 ENCOUNTER — Encounter: Payer: Self-pay | Admitting: Internal Medicine

## 2024-04-07 ENCOUNTER — Inpatient Hospital Stay: Attending: Internal Medicine

## 2024-04-07 ENCOUNTER — Inpatient Hospital Stay

## 2024-04-07 ENCOUNTER — Inpatient Hospital Stay (HOSPITAL_BASED_OUTPATIENT_CLINIC_OR_DEPARTMENT_OTHER): Admitting: Internal Medicine

## 2024-04-07 VITALS — BP 128/69 | HR 73 | Temp 96.1°F | Ht 63.0 in | Wt 134.3 lb

## 2024-04-07 DIAGNOSIS — D509 Iron deficiency anemia, unspecified: Secondary | ICD-10-CM

## 2024-04-07 DIAGNOSIS — D508 Other iron deficiency anemias: Secondary | ICD-10-CM | POA: Diagnosis not present

## 2024-04-07 DIAGNOSIS — D563 Thalassemia minor: Secondary | ICD-10-CM | POA: Diagnosis not present

## 2024-04-07 DIAGNOSIS — I739 Peripheral vascular disease, unspecified: Secondary | ICD-10-CM | POA: Diagnosis not present

## 2024-04-07 DIAGNOSIS — E538 Deficiency of other specified B group vitamins: Secondary | ICD-10-CM | POA: Diagnosis not present

## 2024-04-07 DIAGNOSIS — D649 Anemia, unspecified: Secondary | ICD-10-CM

## 2024-04-07 DIAGNOSIS — D561 Beta thalassemia: Secondary | ICD-10-CM | POA: Diagnosis not present

## 2024-04-07 LAB — BASIC METABOLIC PANEL - CANCER CENTER ONLY
Anion gap: 9 (ref 5–15)
BUN: 11 mg/dL (ref 8–23)
CO2: 21 mmol/L — ABNORMAL LOW (ref 22–32)
Calcium: 9.2 mg/dL (ref 8.9–10.3)
Chloride: 106 mmol/L (ref 98–111)
Creatinine: 0.97 mg/dL (ref 0.44–1.00)
GFR, Estimated: >60 mL/min (ref >60)
Glucose, Bld: 98 mg/dL (ref 70–99)
Potassium: 4.4 mmol/L (ref 3.5–5.1)
Sodium: 136 mmol/L (ref 135–145)

## 2024-04-07 LAB — SAMPLE TO BLOOD BANK

## 2024-04-07 LAB — CBC WITH DIFFERENTIAL (CANCER CENTER ONLY)
Abs Immature Granulocytes: 0.03 K/uL (ref 0.00–0.07)
Basophils Absolute: 0.1 K/uL (ref 0.0–0.1)
Basophils Relative: 1 %
Eosinophils Absolute: 0.2 K/uL (ref 0.0–0.5)
Eosinophils Relative: 3 %
HCT: 28.8 % — ABNORMAL LOW (ref 36.0–46.0)
Hemoglobin: 8.9 g/dL — ABNORMAL LOW (ref 12.0–15.0)
Immature Granulocytes: 0 %
Lymphocytes Relative: 20 %
Lymphs Abs: 1.6 K/uL (ref 0.7–4.0)
MCH: 22.6 pg — ABNORMAL LOW (ref 26.0–34.0)
MCHC: 30.9 g/dL (ref 30.0–36.0)
MCV: 73.3 fL — ABNORMAL LOW (ref 80.0–100.0)
Monocytes Absolute: 0.6 K/uL (ref 0.1–1.0)
Monocytes Relative: 8 %
Neutro Abs: 5.5 K/uL (ref 1.7–7.7)
Neutrophils Relative %: 68 %
Platelet Count: 217 K/uL (ref 150–400)
RBC: 3.93 MIL/uL (ref 3.87–5.11)
RDW: 21.6 % — ABNORMAL HIGH (ref 11.5–15.5)
WBC Count: 8.1 K/uL (ref 4.0–10.5)
nRBC: 0.7 % — ABNORMAL HIGH (ref 0.0–0.2)

## 2024-04-07 MED ORDER — CYANOCOBALAMIN 1000 MCG/ML IJ SOLN
1000.0000 ug | Freq: Once | INTRAMUSCULAR | Status: AC
  Administered 2024-04-07: 1000 ug via INTRAMUSCULAR
  Filled 2024-04-07: qty 1

## 2024-04-07 NOTE — Telephone Encounter (Signed)
 [facility] for second opinion/MDS.

## 2024-04-07 NOTE — Progress Notes (Signed)
 Cheryl [name] CONSULT NOTE  Patient Care Team: Marylynn Verneita CROME, MD as PCP - General (Internal Medicine) Perla Evalene PARAS, MD as PCP - Cardiology (Cardiology) Dessa Reyes ORN, MD as Consulting Physician (General Surgery) Rennie Cindy SAUNDERS, MD as Consulting Physician (Oncology) Tamea Dedra CROME, MD as Consulting Physician (Pulmonary Disease)  CHIEF COMPLAINTS/PURPOSE OF CONSULTATION: Anemia.  HEMATOLOGY HISTORY  # CHRONIC MICROCYTIC ANEMIA- BETA THALASSEMIA MINOR [colo- 2016; Dr. Jerold July 2017- IV trial of Venofer  x4   # Lung nodules [CT- sub cm Jan 2017]-2019 CT scan stable.  Benign.  HISTORY OF PRESENTING ILLNESS: with her husband. Walking independently.   Cheryl [name] 68 y.o.  female with a history of beta thalassemia minor; also iron  deficiency anemia/B12 deficiency is here for follow-up-noted to have worsening anemia.  Discussed the use of AI scribe software for clinical note transcription with the patient, who gave verbal consent to proceed.  History of Present Illness   Cheryl [name] is a 68 year old female with anemia and thalassemia trait who presents for review of bone marrow biopsy results.  She has a history of thalassemia trait and has been experiencing worsening anemia over the past few months, necessitating blood transfusions. Her hemoglobin levels have been dropping despite receiving iron  infusions, and she has not been losing blood. Her hemoglobin was noted to be 7.9, which increased to 9.9 after a blood transfusion but is now trending down to 8.9.  A bone marrow biopsy was performed, revealing hypercellular bone marrow. 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 She is concerned about the implications of this mutation, especially in the context of her anemia.  She inquired about a previously identified defective gene, MTHFR, which she learned about years ago.  She feels unusually  tired, which she attributes to her anemia, stating 'I'm normally not this tired.'      Review of Systems  Constitutional:  Positive for weight loss. Negative for chills, diaphoresis and fever.  HENT:  Negative for nosebleeds and sore throat.   Eyes:  Negative for double vision.  Respiratory:  Negative for cough, hemoptysis, sputum production and wheezing.   Cardiovascular:  Negative for chest pain, palpitations, orthopnea and leg swelling.  Gastrointestinal:  Positive for diarrhea. Negative for abdominal pain, blood in stool, constipation, heartburn, melena, nausea and vomiting.  Genitourinary:  Negative for dysuria, frequency and urgency.  Musculoskeletal:  Negative for back pain and joint pain.  Skin: Negative.  Negative for itching and rash.  Neurological:  Positive for dizziness. Negative for tingling, focal weakness, weakness and headaches.  Endo/Heme/Allergies:  Does not bruise/bleed easily.  Psychiatric/Behavioral:  Negative for depression. The patient is not nervous/anxious and does not have insomnia.      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   Arrhythmia    left bundle branch block   Arthritis    Blood in stool    Chicken pox    Generalized headaches    History of blood transfusion    Kidney disease, chronic, stage III (GFR 30-59 ml/min) (HCC)    LBBB (left bundle branch block)    Neuromuscular disorder (HCC)    Renal disorder    Right lateral epicondylitis 08/13/2017   Thyroid  disease  UTI (lower urinary tract infection)     SURGICAL HISTORY: Past Surgical History:  Procedure Laterality Date   ABDOMINAL HYSTERECTOMY  1997   APPENDECTOMY  1981   BREAST EXCISIONAL BIOPSY Bilateral 1976   neg   BREAST SURGERY  1976   CARDIAC  CATHETERIZATION  2004   [name]   CARDIAC CATHETERIZATION  2006   [facility]   cystic fibrosis tumor removal  1983   IR BONE MARROW BIOPSY & ASPIRATION  03/05/2024   RIGHT/LEFT HEART CATH AND CORONARY ANGIOGRAPHY Bilateral 07/15/2023   Procedure: RIGHT/LEFT HEART CATH AND CORONARY ANGIOGRAPHY;  Surgeon: Darron Deatrice LABOR, MD;  Location: [facility] INVASIVE [facility] LAB;  Service: Cardiovascular;  Laterality: Bilateral;   THUMB AMPUTATION  1992   traumatic   TONSILLECTOMY AND ADENOIDECTOMY  1964   TUBAL LIGATION  1980   VISCERAL ANGIOGRAPHY N/A 06/29/2022   Procedure: VISCERAL ANGIOGRAPHY;  Surgeon: Jama Cordella MATSU, MD;  Location: [facility] INVASIVE CV LAB;  Service: Cardiovascular;  Laterality: N/A;   VISCERAL ANGIOGRAPHY N/A 07/17/2022   Procedure: VISCERAL ANGIOGRAPHY;  Surgeon: Jama Cordella MATSU, MD;  Location: [facility] INVASIVE CV LAB;  Service: Cardiovascular;  Laterality: N/A;    SOCIAL HISTORY: Social History   Socioeconomic History   Marital status: Married    Spouse name: Not on file   Number of children: 3   Years of education: Not on file   Highest education level: Not on file  Occupational History   Occupation: Retired  Tobacco Use   Smoking status: Every Day    Current packs/day: 0.25    Average packs/day: 1 pack/day for 45.0 years (44.3 ttl pk-yrs)    Types: Cigarettes    Start date: 04/11/1979   Smokeless tobacco: Never   Tobacco comments:    3-4 cigarettes daily, trying to cut back.  Vaping Use   Vaping status: Never Used  Substance and Sexual Activity   Alcohol use: Not Currently    Comment: occasional   Drug use: No   Sexual activity: Not on file  Other Topics Concern   Not on file  Social History Narrative   Married to [employer]; Works for [employer], engineer, structural. Quit smoking 2018; ocassional alcohol; lives near to [place].    Social Drivers of Health   Financial Resource Strain: Low Risk  (10/15/2023)   Overall Financial Resource Strain (CARDIA)    Difficulty  of Paying Living Expenses: Not hard at all  Food Insecurity: No Food Insecurity (11/18/2023)   Received from [facility]   Hunger Vital Sign    Within the past 12 months, you worried that your food would run out before you got the money to buy more.: Never true    Within the past 12 months, the food you bought just didn't last and you didn't have money to get more.: Never true  Transportation Needs: No Transportation Needs (11/18/2023)   Received from [facility] - Transportation    Lack of Transportation (Medical): No    Lack of Transportation (Non-Medical): No  Physical Activity: Inactive (10/15/2023)   Exercise Vital Sign    Days of Exercise per Week: 0 days    Minutes of Exercise per Session: 0 min  Stress: No Stress Concern Present (10/15/2023)   [employer] - Occupational Stress Questionnaire    Feeling of Stress : Only a little  Social Connections: Moderately Integrated (10/15/2023)   Social Connection and Isolation Panel    Frequency of Communication with Friends  and Family: More than three times a week    Frequency of Social Gatherings with Friends and Family: More than three times a week    Attends Religious Services: Never    Database Administrator or Organizations: Yes    Attends Engineer, Structural: More than 4 times per year    Marital Status: Married  Catering Manager Violence: Not At Risk (01/16/2024)   Received from [facility]   Humiliation, Afraid, Rape, and Kick questionnaire    Within the last year, have you been afraid of your partner or ex-partner?: No    Within the last year, have you been humiliated or emotionally abused in other ways by your partner or ex-partner?: No    Within the last year, have you been kicked, hit, slapped, or otherwise physically hurt by your partner or ex-partner?: No    Within the last year, have you been raped or forced to have any kind of sexual activity by your partner or  ex-partner?: No    FAMILY HISTORY: Family History  Problem Relation Age of Onset   Heart disease Mother    Arthritis Mother    Cancer Mother        breast   Hyperlipidemia Mother    Hypertension Mother    Heart attack Mother    Breast cancer Mother 16   Kidney disease Mother    Heart disease Father    Diabetes Father    Arthritis Father    Hypertension Father    Parkinson's disease Father    Hodgkin's lymphoma Father        hodgkins disease, prostate   Heart disease Sister    Diabetes Sister    Diabetes Maternal Aunt    Heart disease Maternal Grandmother    Diabetes Maternal Grandmother    Heart disease Maternal Grandfather    Stroke Maternal Grandfather    Heart disease Paternal Grandmother    Diabetes Paternal Grandmother    Stroke Paternal Grandfather    Heart disease Paternal Grandfather    Diabetes Paternal Grandfather    Breast cancer Cousin        2 pat cousins   Heart disease Brother 25       ami x 8,  4 vessel CABG    Diabetes Brother    Liver disease Brother    Lung disease Brother     ALLERGIES:  is allergic to peanuts [peanut oil], penicillins, sulfa antibiotics, corticosteroids, influenza vac split [influenza virus vaccine], losartan , mobic  [meloxicam ], nickel, nsaids, penicillin g, and prednisone .  MEDICATIONS:  Current Outpatient Medications  Medication Sig Dispense Refill   Albuterol -Budesonide (AIRSUPRA ) 90-80 MCG/ACT AERO Inhale 1 puff into the lungs every 6 (six) hours as needed. 5.9 g 11   amitriptyline  (ELAVIL ) 50 MG tablet TAKE 1 TABLET BY MOUTH AT BEDTIME 90 tablet 1   buPROPion  (WELLBUTRIN ) 100 MG tablet Take 1.5 tablets (150 mg total) by mouth 2 (two) times daily. 90 tablet 1   Calcium  Carbonate-Vit D-Min (CALCIUM  1200 PO) Take 1,200 mg by mouth daily.     carvedilol  (COREG ) 6.25 MG tablet Take 1 tablet (6.25 mg total) by mouth 2 (two) times daily with a meal. 180 tablet 3   Cholecalciferol (VITAMIN D ) 125 MCG (5000 UT) CAPS Take 5,000  Units by mouth daily.     clopidogrel (PLAVIX) 75 MG tablet Take 75 mg by mouth daily.     furosemide  (LASIX ) 20 MG tablet Take 1 tablet (20 mg total) by mouth  daily as needed. 30 tablet 3   gabapentin  (NEURONTIN ) 300 MG capsule Take 1 capsule (300 mg total) by mouth 3 (three) times daily. 270 capsule 1   HYDROcodone -acetaminophen  (NORCO/VICODIN) 5-325 MG tablet Take 1 tablet by mouth 2 (two) times daily as needed for moderate pain (pain score 4-6). 60 tablet 0   HYDROcodone -acetaminophen  (NORCO/VICODIN) 5-325 MG tablet Take 1 tablet by mouth 2 (two) times daily as needed for moderate pain (pain score 4-6). 60 tablet 0   HYDROcodone -acetaminophen  (NORCO/VICODIN) 5-325 MG tablet Take 1 tablet by mouth 2 (two) times daily as needed for moderate pain (pain score 4-6). 60 tablet 0   megestrol  (MEGACE ) 20 MG tablet Take 1 tablet by mouth once daily 30 tablet 1   nitroGLYCERIN  (NITROSTAT ) 0.4 MG SL tablet Place 1 tablet (0.4 mg total) under the tongue every 5 (five) minutes as needed for chest pain. DISSOLVE 1 TABLET UNDER THE TONGUE EVERY 5 MINUTES AS  NEEDED FOR CHEST PAIN. MAX  OF 3 TABLETS IN 15 MINUTES. CALL 911 IF PAIN PERSISTS. 100 tablet 3   omeprazole  (PRILOSEC) 40 MG capsule Take 1 capsule (40 mg total) by mouth daily. 90 capsule 1   ondansetron  (ZOFRAN ) 4 MG tablet Take 1 tablet (4 mg total) by mouth every 8 (eight) hours as needed. 60 tablet 2   Peppermint Oil (IBGARD) 90 MG CPCR Take 1 capsule by mouth daily.     Prasterone, DHEA, 10 MG CAPS Take 10 mg by mouth daily.     progesterone  (PROMETRIUM ) 200 MG capsule Take 200 mg by mouth at bedtime.     rosuvastatin  (CRESTOR ) 10 MG tablet Take 1 tablet (10 mg total) by mouth daily. 90 tablet 1   tiZANidine  (ZANAFLEX ) 2 MG tablet Take 1 tablet (2 mg total) by mouth at bedtime. 90 tablet 1   traMADol  (ULTRAM ) 50 MG tablet Take 1 tablet (50 mg total) by mouth 2 (two) times daily as needed. for pain 60 tablet 2   venlafaxine  XR (EFFEXOR -XR) 75 MG 24  hr capsule Take 1 capsule (75 mg total) by mouth daily. 90 capsule 1   vitamin C  (ASCORBIC ACID ) 500 MG tablet Take 500 mg by mouth daily.     rOPINIRole  (REQUIP ) 1 MG tablet TAKE 1 TABLET BY MOUTH THREE TIMES DAILY 90 tablet 1   No current facility-administered medications for this visit.      SABRA  PHYSICAL EXAMINATION:   Vitals:   04/07/24 0854  BP: 128/69  Pulse: 73  Temp: (!) 96.1 F (35.6 C)  SpO2: 100%     Filed Weights   04/07/24 0854  Weight: 134 lb 4.8 oz (60.9 kg)     Physical Exam HENT:     Head: Normocephalic and atraumatic.     Mouth/Throat:     Pharynx: No oropharyngeal exudate.  Eyes:     Pupils: Pupils are equal, round, and reactive to light.  Cardiovascular:     Rate and Rhythm: Normal rate and regular rhythm.  Pulmonary:     Effort: Pulmonary effort is normal. No respiratory distress.     Breath sounds: Normal breath sounds. No wheezing.  Abdominal:     General: Bowel sounds are normal. There is no distension.     Palpations: Abdomen is soft. There is no mass.     Tenderness: There is no abdominal tenderness. There is no guarding or rebound.  Musculoskeletal:        General: No tenderness. Normal range of motion.  Cervical back: Normal range of motion and neck supple.  Skin:    General: Skin is warm.  Neurological:     Mental Status: She is alert and oriented to person, place, and time.  Psychiatric:        Mood and Affect: Affect normal.      LABORATORY DATA:  I have reviewed the data as listed Lab Results  Component Value Date   WBC 8.1 04/07/2024   HGB 8.9 (L) 04/07/2024   HCT 28.8 (L) 04/07/2024   MCV 73.3 (L) 04/07/2024   PLT 217 04/07/2024   Recent Labs    05/14/23 1505 06/24/23 1427 10/28/23 1816 11/12/23 1453 02/18/24 1356 02/24/24 1301 03/16/24 1009 04/07/24 0836  NA  --    < > 140 137 139 137 137 136  K  --    < > 4.0 5.0 3.7 4.2 4.4 4.4  CL  --    < > 105 107 110 111 108 106  CO2  --    < > 23 24 22  21* 22  21*  GLUCOSE  --    < > 102* 81 139* 92 129* 98  BUN  --    < > 10 15 11 12 12 11   CREATININE  --    < > 0.77 0.97 0.89 0.94 0.95 0.97  CALCIUM   --    < > 9.7 8.7 8.5 8.6* 8.9 9.2  GFRNONAA  --    < > >60  --   --  >60 >60 >60  PROT 6.8   < > 8.1 6.3 5.7*  --   --   --   ALBUMIN 4.5   < > 5.1* 4.4 3.9  --   --   --   AST 18   < > 20 19 11   --   --   --   ALT 22   < > 17 17 8   --   --   --   ALKPHOS 90   < > 98 70 57  --   --   --   BILITOT 0.9   < > 1.6* 1.1 0.9  --   --   --   BILIDIR 0.2  --   --  0.2  --   --   --   --    < > = values in this interval not displayed.     No results found.   ASSESSMENT & PLAN:   Symptomatic anemia # Chronic microcytic anemia/history of beta thalassemia  [baseline around 9-10]- out of proportion to her anemia. S/p IV venoferx4 in fall of 2017.  # However-September October 2025 worsening anemia needing blood transfusion-bone marrow biopsy October 2025:  Hypercellular bone marrow (50%) with erythroid predominant trilineage hematopoiesis.  Morphologic evaluation of the bone marrow does not reveal any significant dysplasia.  No bone marrow infiltrative processes are identified.  Blasts are not increased. 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  Cytogenetics-normal.  Adequate iron  storage; no ring sideroblasts.   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  However rule out MDS-recommend second opinion at [facility]  # Discussed use of erythropoietin  stimulating agents like retacrit to stimulate the bone marrow.  Discussed the potential issues with erythropoietin  estimating agents-given the risk of stroke thromboembolic events/elevated blood pressure.    # I discussed that I would recommend using iron  infusion/Venofer ; along  with Retacrit to maintain hemoglobin around 10- 11. I also discussed increased risk of  thromboembolic events in the target hemoglobin is around 12 to 13.     # PVD-status post abdominal stenting.   anti-K antibodies.  # DISPOSITION: # B12 inection # Referral to [facility]/second opinion- MDS # start Next week  lab- H&H- -Retacrit- every 2 weeks- x 3 # follow up in 6 weeks- MD; B12 injection; Retacrit- labs- cbc/bmp; HOLD tube; D-2 possible 1 unit- Dr.B   Cc; Dr.Tullo.     Cindy JONELLE Joe, MD 04/07/2024 4:05 PM

## 2024-04-07 NOTE — Progress Notes (Signed)
 Patient states no appetite and having diarrhea 2-3 times daily. She has questions regarding results from bone marrow biopsy.

## 2024-04-07 NOTE — Assessment & Plan Note (Addendum)
#   Chronic microcytic anemia/history of beta thalassemia  [baseline around 9-10]- out of proportion to her anemia. S/p IV venoferx4 in fall of 2017.  # However-September October 2025 worsening anemia needing blood transfusion-bone marrow biopsy October 2025:  Hypercellular bone marrow (50%) with erythroid predominant trilineage hematopoiesis.  Morphologic evaluation of the bone marrow does not reveal any significant dysplasia.  No bone marrow infiltrative processes are identified.  Blasts are not increased. 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  Cytogenetics-normal.  Adequate iron  storage; no ring sideroblasts.   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  However rule out MDS-recommend second opinion at [facility]  # Discussed use of erythropoietin  stimulating agents like retacrit to stimulate the bone marrow.  Discussed the potential issues with erythropoietin  estimating agents-given the risk of stroke thromboembolic events/elevated blood pressure.    # I discussed that I would recommend using iron  infusion/Venofer ; along with Retacrit to maintain hemoglobin around 10- 11. I also discussed increased risk of thromboembolic events in the target hemoglobin is around 12 to 13.     # PVD-status post abdominal stenting.   anti-K antibodies.  # DISPOSITION: # B12 inection # Referral to [facility]/second opinion- MDS # start Next week  lab- H&H- -Retacrit- every 2 weeks- x 3 # follow up in 6 weeks- MD; B12 injection; Retacrit- labs- cbc/bmp; HOLD tube; D-2 possible 1 unit- Dr.B   Cc; Dr.Tullo.

## 2024-04-09 ENCOUNTER — Encounter: Payer: Self-pay | Admitting: Internal Medicine

## 2024-04-09 ENCOUNTER — Ambulatory Visit: Admitting: Pulmonary Disease

## 2024-04-09 NOTE — Telephone Encounter (Signed)
 Cancel lab-injection appts- keep MD; labs-injection appt in dec. GB

## 2024-04-09 NOTE — Telephone Encounter (Signed)
 Is it okay to refill?  Last OV: 02/18/2024 Next OV: 05/26/2024

## 2024-04-10 MED ORDER — CLOPIDOGREL BISULFATE 75 MG PO TABS: 75.0000 mg | ORAL_TABLET | Freq: Every day | ORAL | 0 refills | Status: AC

## 2024-04-10 NOTE — Addendum Note (Signed)
 Addended by: GRETEL APP on: 04/10/2024 11:02 AM   Modules accepted: Orders

## 2024-04-10 NOTE — Telephone Encounter (Signed)
 Since medication has not been prescribed by Dr. Tullo before I have pended the medication for you to sign off on.

## 2024-04-10 NOTE — Addendum Note (Signed)
 Addended by: Allsion Nogales on: 04/10/2024 10:14 AM   Modules accepted: Orders

## 2024-04-14 ENCOUNTER — Inpatient Hospital Stay

## 2024-04-14 DIAGNOSIS — D46A Refractory cytopenia with multilineage dysplasia: Secondary | ICD-10-CM | POA: Diagnosis not present

## 2024-04-27 ENCOUNTER — Other Ambulatory Visit: Payer: Self-pay | Admitting: Internal Medicine

## 2024-04-28 ENCOUNTER — Inpatient Hospital Stay

## 2024-04-28 NOTE — Telephone Encounter (Signed)
 Refilled: 02/27/2024 Last OV: 02/18/2024 Next OV: 05/26/2024

## 2024-05-01 ENCOUNTER — Encounter: Payer: Self-pay | Admitting: Family Medicine

## 2024-05-01 ENCOUNTER — Ambulatory Visit (INDEPENDENT_AMBULATORY_CARE_PROVIDER_SITE_OTHER): Admitting: Family Medicine

## 2024-05-01 VITALS — BP 132/70 | HR 93 | Resp 16 | Ht 63.0 in | Wt 137.0 lb

## 2024-05-01 DIAGNOSIS — R300 Dysuria: Secondary | ICD-10-CM | POA: Diagnosis not present

## 2024-05-01 DIAGNOSIS — R10A1 Flank pain, right side: Secondary | ICD-10-CM | POA: Diagnosis not present

## 2024-05-01 DIAGNOSIS — Z87440 Personal history of urinary (tract) infections: Secondary | ICD-10-CM

## 2024-05-01 LAB — POCT URINALYSIS DIPSTICK
Appearance: NORMAL
Bilirubin, UA: NEGATIVE
Blood, UA: NEGATIVE
Glucose, UA: NEGATIVE
Ketones, UA: NEGATIVE
Nitrite, UA: NEGATIVE
Protein, UA: POSITIVE — AB
Spec Grav, UA: 1.025 (ref 1.010–1.025)
Urobilinogen, UA: 0.2 U/dL
pH, UA: 6 (ref 5.0–8.0)

## 2024-05-01 MED ORDER — NITROFURANTOIN MONOHYD MACRO 100 MG PO CAPS: 100.0000 mg | ORAL_CAPSULE | Freq: Two times a day (BID) | ORAL | 0 refills | Status: DC

## 2024-05-01 NOTE — Progress Notes (Signed)
 Acute Office Visit  Subjective:     Patient ID: Cheryl [name], female    DOB: [birth year], 68 y.o.   MRN: [id number]  Chief Complaint  Patient presents with   Dysuria    HPI Patient is in today for complaints of dysuria starting on Wednesday. She also endorses flank pain and lower abdominal pain. Denies fever. Denies increase in urinary frequency. She states she often can go 12 hours without voiding despite adequate hydration. She describes having to force herself to void but states this an ongoing problem for the last several years. She voices in the last few years she has noticed that urinary frequency has decreased even more than baseline in the last one year. She voices in June of this year she was hospitalized s/t UTI. She admits to recurrent UTI stating this is her 4th UTI this year. She voices she does not have a urologist but was seen by urology 3 to 4 years ago.   Review of Systems  Constitutional:  Positive for malaise/fatigue. Negative for chills and fever.  Gastrointestinal:  Positive for abdominal pain. Negative for nausea and vomiting.  Genitourinary:  Positive for dysuria and flank pain. Negative for frequency and urgency.        Objective:    BP 132/70   Pulse 93   Resp 16   Ht 5' 3 (1.6 m)   Wt 137 lb (62.1 kg)   SpO2 98%   BMI 24.27 kg/m    Physical Exam Constitutional:      General: She is not in acute distress.    Appearance: Normal appearance. She is not ill-appearing or toxic-appearing.  Cardiovascular:     Rate and Rhythm: Normal rate and regular rhythm.     Heart sounds: Normal heart sounds.  Pulmonary:     Effort: Pulmonary effort is normal.     Breath sounds: Normal breath sounds.  Skin:    General: Skin is warm and dry.  Neurological:     General: No focal deficit present.     Mental Status: She is alert.  Psychiatric:        Mood and Affect: Mood normal.        Behavior: Behavior normal.     Results for orders placed or  performed in visit on 05/01/24  POCT Urinalysis Dipstick  Result Value Ref Range   Color, UA Yellow    Clarity, UA Clear    Glucose, UA Negative Negative   Bilirubin, UA Negative    Ketones, UA Negative    Spec Grav, UA 1.025 1.010 - 1.025   Blood, UA Negative    pH, UA 6.0 5.0 - 8.0   Protein, UA Positive (A) Negative   Urobilinogen, UA 0.2 0.2 or 1.0 E.U./dL   Nitrite, UA Negative    Leukocytes, UA Small (1+) (A) Negative   Appearance Normal    Odor None       Latest Ref Rng & Units 04/07/2024    8:36 AM 03/16/2024   10:09 AM 02/24/2024    1:01 PM  CMP  Glucose 70 - 99 mg/dL 98  870  92   BUN 8 - 23 mg/dL 11  12  12    Creatinine 0.44 - 1.00 mg/dL 9.02  9.04  9.05   Sodium 135 - 145 mmol/L 136  137  137   Potassium 3.5 - 5.1 mmol/L 4.4  4.4  4.2   Chloride 98 - 111 mmol/L 106  108  111  CO2 22 - 32 mmol/L 21  22  21    Calcium  8.9 - 10.3 mg/dL 9.2  8.9  8.6         Assessment & Plan:   1. Dysuria (Primary) Patient complains of dysuria. She also voices decreased urinary frequency, not new but worse than before. Urine dipstick positive leuks, negative nitrates. She reports hx of hospitalization earlier this year due to UTI. She denies fever, nausea, or vomiting. She does endorse associated right sided flank pain and lower abdominal pain.  -Start Macrobid  100mg  BID x5 days with symptoms reported, hx of recurrent UTI, and hx of UTI requiring hospitalization.  -Urine culture sent for further evaluation.  -Referral to urology due to hx of recurrent UTI.   - POCT Urinalysis Dipstick - Urine Culture - Ambulatory referral to Urology - nitrofurantoin , macrocrystal-monohydrate, (MACROBID ) 100 MG capsule; Take 1 capsule (100 mg total) by mouth 2 (two) times daily with a meal.  Dispense: 10 capsule; Refill: 0  2. History of recurrent UTI (urinary tract infection) Patient reports history of recurrent UTI. She reports 4 UTIs in the last one year. Seen by urology 3 to 4 years ago but  no recent visits. Will refer to urology.  -Advised to maintain PCP follow up scheduled on 05/26/24 - Ambulatory referral to Urology - nitrofurantoin , macrocrystal-monohydrate, (MACROBID ) 100 MG capsule; Take 1 capsule (100 mg total) by mouth 2 (two) times daily with a meal.  Dispense: 10 capsule; Refill: 0  3. Right flank pain Patient reports right flank pain associated with dysuria.  - Ambulatory referral to Urology    Meds ordered this encounter  Medications   nitrofurantoin , macrocrystal-monohydrate, (MACROBID ) 100 MG capsule    Sig: Take 1 capsule (100 mg total) by mouth 2 (two) times daily with a meal.    Dispense:  10 capsule    Refill:  0    Return if symptoms worsen or fail to improve.  LAYMON LOISE CORE, FNP

## 2024-05-02 ENCOUNTER — Encounter: Payer: Self-pay | Admitting: Internal Medicine

## 2024-05-02 DIAGNOSIS — M48061 Spinal stenosis, lumbar region without neurogenic claudication: Secondary | ICD-10-CM

## 2024-05-02 LAB — URINE CULTURE
MICRO NUMBER:: 17318828
SPECIMEN QUALITY:: ADEQUATE

## 2024-05-04 ENCOUNTER — Ambulatory Visit: Payer: Self-pay | Admitting: Family Medicine

## 2024-05-04 MED ORDER — GABAPENTIN 300 MG PO CAPS: 300.0000 mg | ORAL_CAPSULE | Freq: Three times a day (TID) | ORAL | 1 refills | Status: AC

## 2024-05-04 MED ORDER — TRAMADOL HCL 50 MG PO TABS: 50.0000 mg | ORAL_TABLET | Freq: Two times a day (BID) | ORAL | 2 refills | Status: AC | PRN

## 2024-05-05 DIAGNOSIS — D649 Anemia, unspecified: Secondary | ICD-10-CM | POA: Diagnosis not present

## 2024-05-10 ENCOUNTER — Other Ambulatory Visit: Payer: Self-pay | Admitting: Internal Medicine

## 2024-05-12 ENCOUNTER — Inpatient Hospital Stay

## 2024-05-12 NOTE — Telephone Encounter (Unsigned)
 Copied from CRM #[id number]. Topic: General - Other >> May 12, 2024  2:01 PM Cheryl [name] wrote: Reason for CRM: Patient returning missed call from office. Patient relayed that she has been taking the the 100 mg of the Wellbutrin .

## 2024-05-18 ENCOUNTER — Inpatient Hospital Stay

## 2024-05-18 ENCOUNTER — Inpatient Hospital Stay: Admitting: Internal Medicine

## 2024-05-18 ENCOUNTER — Telehealth: Payer: Self-pay | Admitting: Internal Medicine

## 2024-05-18 ENCOUNTER — Inpatient Hospital Stay: Attending: Internal Medicine

## 2024-05-18 MED ORDER — BUPROPION HCL 100 MG PO TABS: 100.0000 mg | ORAL_TABLET | Freq: Two times a day (BID) | ORAL | 5 refills | Status: AC

## 2024-05-18 NOTE — Telephone Encounter (Signed)
 Noted

## 2024-05-18 NOTE — Addendum Note (Signed)
 Addended by: MARYLYNN VERNEITA CROME on: 05/18/2024 08:14 AM   Modules accepted: Orders

## 2024-05-18 NOTE — Telephone Encounter (Signed)
 Pt called and needs to cancel appts for today 12/22 due to spouse being in the hospital at [place]. I spoke with pt and confirmed the cancellation of the appts today and that it will also cancel the appt for tomorrow. Pt understood. Appts canceled and noted.

## 2024-05-18 NOTE — Addendum Note (Signed)
 Addended by: MARYLYNN VERNEITA CROME on: 05/18/2024 08:13 AM   Modules accepted: Orders

## 2024-05-19 ENCOUNTER — Inpatient Hospital Stay

## 2024-05-25 ENCOUNTER — Encounter: Payer: Self-pay | Admitting: Physician Assistant

## 2024-05-25 ENCOUNTER — Encounter: Payer: Self-pay | Admitting: Internal Medicine

## 2024-05-25 ENCOUNTER — Ambulatory Visit: Admitting: Physician Assistant

## 2024-05-25 ENCOUNTER — Other Ambulatory Visit: Payer: Self-pay | Admitting: Internal Medicine

## 2024-05-25 VITALS — BP 137/72 | HR 102 | Ht 63.0 in | Wt 137.0 lb

## 2024-05-25 DIAGNOSIS — R8271 Bacteriuria: Secondary | ICD-10-CM | POA: Diagnosis not present

## 2024-05-25 DIAGNOSIS — R3129 Other microscopic hematuria: Secondary | ICD-10-CM | POA: Diagnosis not present

## 2024-05-25 DIAGNOSIS — A599 Trichomoniasis, unspecified: Secondary | ICD-10-CM | POA: Diagnosis not present

## 2024-05-25 LAB — URINALYSIS, COMPLETE
Bilirubin, UA: NEGATIVE
Glucose, UA: NEGATIVE
Ketones, UA: NEGATIVE
Nitrite, UA: NEGATIVE
Protein,UA: NEGATIVE
Specific Gravity, UA: 1.01 (ref 1.005–1.030)
Urobilinogen, Ur: 1 mg/dL (ref 0.2–1.0)
pH, UA: 6 (ref 5.0–7.5)

## 2024-05-25 LAB — BLADDER SCAN AMB NON-IMAGING

## 2024-05-25 LAB — MICROSCOPIC EXAMINATION
Epithelial Cells (non renal): >10 /HPF — AB (ref 0–10)
WBC, UA: >30 /HPF — AB (ref 0–5)

## 2024-05-25 MED ORDER — METRONIDAZOLE 500 MG PO TABS: 500.0000 mg | ORAL_TABLET | Freq: Two times a day (BID) | ORAL | 0 refills | Status: AC

## 2024-05-25 MED ORDER — METRONIDAZOLE 500 MG PO TABS: 2000.0000 mg | ORAL_TABLET | Freq: Once | ORAL | 0 refills | Status: AC

## 2024-05-25 NOTE — Progress Notes (Signed)
 "  05/25/2024 11:22 AM   Cheryl [name] April 03, 1956 [id number]  CC: Chief Complaint  Patient presents with   Dysuria   HPI: Cheryl [name] is a 68 y.o. female with PMH CKD 3 who presents today as a new patient for evaluation of recurrent UTI.   Today she reports she has been treated for 4 UTIs this year, though she is typically asymptomatic and will be diagnosed at the time of a routine urinalysis.  She also states she can go up to 12 hours without voiding and may strain a little bit when she urinates, but ultimately feels she empties well.  She denies dysuria or gross hematuria.  She had some right flank pain at the time that she was referred to our practice, however this has resolved.  She only engages in sexual activity with her husband, though they do not have penetrative intercourse due to his erectile dysfunction.  He does use sex toys on her.  She states the relationship is monogamous.  Urine culture history as follows: 05/01/2024: Mixed urogenital flora 10/30/2023: Ampicillin resistant E. Coli (50K-100K CFU/mL) 10/17/2022: Insignificant growth 10/16/2022: Ampicillin resistant E. coli 07/09/2022: Ampicillin resistant E. Coli  CT angio AP dated 10/28/2023 showed no urolithiasis or hydronephrosis.  In-office UA today positive for 1+ blood and 2+ leukocytes; urine microscopy with >30 WBCs/HPF, 11-30 RBCs/HPF, >10 epithelial cells/hpf, many bacteria, and trichomonas.  PVR 5 mL.  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   Arrhythmia    left bundle branch block   Arthritis    Blood in stool    Chicken pox    Generalized headaches    History of blood transfusion    Kidney disease, chronic, stage III (GFR 30-59 ml/min) (HCC)    LBBB (left bundle branch block)     Neuromuscular disorder (HCC)    Renal disorder    Right lateral epicondylitis 08/13/2017   Thyroid  disease    UTI (lower urinary tract infection)     Surgical History: Past Surgical History:  Procedure Laterality Date   ABDOMINAL HYSTERECTOMY  1997   APPENDECTOMY  1981   BREAST EXCISIONAL BIOPSY Bilateral 1976   neg   BREAST SURGERY  1976   CARDIAC CATHETERIZATION  2004   [facility]   CARDIAC CATHETERIZATION  2006   [facility]   cystic fibrosis tumor removal  1983   IR BONE MARROW BIOPSY & ASPIRATION  03/05/2024   RIGHT/LEFT HEART CATH AND CORONARY ANGIOGRAPHY Bilateral 07/15/2023   Procedure: RIGHT/LEFT HEART CATH AND CORONARY ANGIOGRAPHY;  Surgeon: Darron Deatrice LABOR, MD;  Location: [facility] INVASIVE CV [facility];  Service: Cardiovascular;  Laterality: Bilateral;   THUMB AMPUTATION  1992   traumatic   TONSILLECTOMY AND ADENOIDECTOMY  1964   TUBAL LIGATION  1980   VISCERAL ANGIOGRAPHY N/A 06/29/2022   Procedure: VISCERAL ANGIOGRAPHY;  Surgeon: Jama Cordella MATSU, MD;  Location: [facility] CV [facility];  Service: Cardiovascular;  Laterality: N/A;   VISCERAL ANGIOGRAPHY N/A 07/17/2022   Procedure: VISCERAL ANGIOGRAPHY;  Surgeon: Jama Cordella MATSU, MD;  Location: [facility];  Service: Cardiovascular;  Laterality: N/A;    Home Medications:  Allergies as of 05/25/2024       Reactions   Peanuts [peanut Oil]    Penicillins Other (See Comments)   Hives,rash,nausea,swelling,  Sulfa Antibiotics Other (See Comments)   Hives,rash,nausea,swelling   Corticosteroids    Other Reaction(s): RASH, SWELLING   Influenza Vac Split [influenza Virus Vaccine]    Pleurisy  1996   Losartan     Reaction: Hypotension   Mobic  [meloxicam ] Nausea Only   Renal insufficiency   Nickel    Nsaids Other (See Comments)   Decreased gfr   Penicillin G    Other Reaction(s): RASH , SWELLING   Prednisone  Rash        Medication List        Accurate as of May 25, 2024 11:22 AM. If you have any questions,  ask your nurse or doctor.          rOPINIRole  1 MG tablet Commonly known as: REQUIP  TAKE 1 TABLET BY MOUTH THREE TIMES DAILY The timing of this medication is very important.   Airsupra  90-80 MCG/ACT Aero Generic drug: Albuterol -Budesonide Inhale 1 puff into the lungs every 6 (six) hours as needed.   amitriptyline  50 MG tablet Commonly known as: ELAVIL  TAKE 1 TABLET BY MOUTH AT BEDTIME   ascorbic acid  500 MG tablet Commonly known as: VITAMIN C  Take 500 mg by mouth daily.   buPROPion  100 MG tablet Commonly known as: WELLBUTRIN  Take 1 tablet (100 mg total) by mouth 2 (two) times daily.   CALCIUM  1200 PO Take 1,200 mg by mouth daily.   carvedilol  6.25 MG tablet Commonly known as: COREG  Take 1 tablet (6.25 mg total) by mouth 2 (two) times daily with a meal.   clopidogrel  75 MG tablet Commonly known as: PLAVIX  Take 1 tablet (75 mg total) by mouth daily.   furosemide  20 MG tablet Commonly known as: LASIX  Take 1 tablet (20 mg total) by mouth daily as needed.   gabapentin  300 MG capsule Commonly known as: NEURONTIN  Take 1 capsule (300 mg total) by mouth 3 (three) times daily.   HYDROcodone -acetaminophen  5-325 MG tablet Commonly known as: NORCO/VICODIN Take 1 tablet by mouth 2 (two) times daily as needed for moderate pain (pain score 4-6).   HYDROcodone -acetaminophen  5-325 MG tablet Commonly known as: NORCO/VICODIN Take 1 tablet by mouth 2 (two) times daily as needed for moderate pain (pain score 4-6).   HYDROcodone -acetaminophen  5-325 MG tablet Commonly known as: NORCO/VICODIN Take 1 tablet by mouth 2 (two) times daily as needed for moderate pain (pain score 4-6).   IBgard 90 MG Cpcr Generic drug: Peppermint Oil Take 1 capsule by mouth daily.   megestrol  20 MG tablet Commonly known as: MEGACE  Take 1 tablet by mouth once daily   nitrofurantoin  (macrocrystal-monohydrate) 100 MG capsule Commonly known as: Macrobid  Take 1 capsule (100 mg total) by mouth 2 (two)  times daily with a meal.   nitroGLYCERIN  0.4 MG SL tablet Commonly known as: NITROSTAT  Place 1 tablet (0.4 mg total) under the tongue every 5 (five) minutes as needed for chest pain. DISSOLVE 1 TABLET UNDER THE TONGUE EVERY 5 MINUTES AS  NEEDED FOR CHEST PAIN. MAX  OF 3 TABLETS IN 15 MINUTES. CALL 911 IF PAIN PERSISTS.   omeprazole  40 MG capsule Commonly known as: PRILOSEC Take 1 capsule (40 mg total) by mouth daily.   ondansetron  4 MG tablet Commonly known as: Zofran  Take 1 tablet (4 mg total) by mouth every 8 (eight) hours as needed.   Prasterone (DHEA) 10 MG Caps Take 10 mg by mouth daily.   progesterone  200 MG capsule Commonly known as: PROMETRIUM  Take 200 mg by mouth at bedtime.   rosuvastatin  10 MG  tablet Commonly known as: CRESTOR  Take 1 tablet (10 mg total) by mouth daily.   tiZANidine  2 MG tablet Commonly known as: ZANAFLEX  Take 1 tablet (2 mg total) by mouth at bedtime.   traMADol  50 MG tablet Commonly known as: ULTRAM  Take 1 tablet (50 mg total) by mouth 2 (two) times daily as needed. for pain   Vitamin D  125 MCG (5000 UT) Caps Take 5,000 Units by mouth daily.        Allergies:  Allergies[1]  Family History: Family History  Problem Relation Age of Onset   Heart disease Mother    Arthritis Mother    Cancer Mother        breast   Hyperlipidemia Mother    Hypertension Mother    Heart attack Mother    Breast cancer Mother 58   Kidney disease Mother    Heart disease Father    Diabetes Father    Arthritis Father    Hypertension Father    Parkinson's disease Father    Hodgkin's lymphoma Father        hodgkins disease, prostate   Heart disease Sister    Diabetes Sister    Diabetes Maternal Aunt    Heart disease Maternal Grandmother    Diabetes Maternal Grandmother    Heart disease Maternal Grandfather    Stroke Maternal Grandfather    Heart disease Paternal Grandmother    Diabetes Paternal Grandmother    Stroke Paternal Grandfather    Heart  disease Paternal Grandfather    Diabetes Paternal Grandfather    Breast cancer Cousin        2 pat cousins   Heart disease Brother 43       ami x 8,  4 vessel CABG    Diabetes Brother    Liver disease Brother    Lung disease Brother     Social History:   reports that she has been smoking cigarettes. She started smoking about 45 years ago. She has a 44.4 pack-year smoking history. She has never used smokeless tobacco. She reports that she does not currently use alcohol. She reports that she does not use drugs.  Physical Exam: BP 137/72   Pulse (!) 102   Ht 5' 3 (1.6 m)   Wt 137 lb (62.1 kg)   BMI 24.27 kg/m   Constitutional:  Alert and oriented, no acute distress, nontoxic appearing HEENT: [place], AT Cardiovascular: No clubbing, cyanosis, or edema Respiratory: Normal respiratory effort, no increased work of breathing Skin: No rashes, bruises or suspicious lesions Neurologic: Grossly intact, no focal deficits, moving all 4 extremities Psychiatric: Normal mood and affect  Laboratory Data: Results for orders placed or performed in visit on 05/25/24  Microscopic Examination   Collection Time: 05/25/24 11:04 AM   Urine  Result Value Ref Range   WBC, UA >30 (A) 0 - 5 /hpf   RBC, Urine 11-30 (A) 0 - 2 /hpf   Epithelial Cells (non renal) >10 (A) 0 - 10 /hpf   Mucus, UA Present (A) Not Estab.   Bacteria, UA Many (A) None seen/Few   Trichomonas, UA Present (A) None seen  Urinalysis, Complete   Collection Time: 05/25/24 11:04 AM  Result Value Ref Range   Specific Gravity, UA 1.010 1.005 - 1.030   pH, UA 6.0 5.0 - 7.5   Color, UA Yellow Yellow   Appearance Ur Slightly cloudy Clear   Leukocytes,UA 2+ (A) Negative   Protein,UA Negative Negative/Trace   Glucose, UA Negative Negative  Ketones, UA Negative Negative   RBC, UA 1+ (A) Negative   Bilirubin, UA Negative Negative   Urobilinogen, Ur 1.0 0.2 - 1.0 mg/dL   Nitrite, UA Negative Negative   Microscopic Examination See below:    Bladder Scan (Post Void Residual) in office   Collection Time: 05/25/24 11:34 AM  Result Value Ref Range   Scan Result 5ml    Assessment & Plan:   1. Bacteriuria (Primary) We discussed the difference between acute cystitis and asymptomatic bacteriuria.  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  I recommended against antibiotic therapy when she is asymptomatic unless undergoing urologic procedures. - Urinalysis, Complete - Bladder Scan (Post Void Residual) in office  2. Trichomoniasis Noted incidentally on UA today.  Unclear duration of infection.  She denies known exposures.  Will treat her and her husband with Flagyl .  We discussed using safe sex practices until they are both done with their treatment course.  We also discussed thoroughly washing their sex toys while they are being treated.  Will bring her back for a lab visit in 6 weeks for repeat testing to prove clearance. - metroNIDAZOLE  (FLAGYL ) 500 MG tablet; Take 1 tablet (500 mg total) by mouth 2 (two) times daily for 7 days.  Dispense: 14 tablet; Refill: 0 - Ct Ng M genitalium NAA, Urine - metroNIDAZOLE  (FLAGYL ) 500 MG tablet; Take 4 tablets (2,000 mg total) by mouth once for 1 dose. For Mr. Mustin.  Dispense: 4 tablet; Refill: 0 - RPR W/RFLX TO RPR TITER, TREPONEMAL AB, SCREEN AND DIAGNOSIS; Future - HIV Antibody (routine testing w rflx); Future - Viral Hepatitis HBV, HCV; Future - Chlamydia/Gonococcus/Trichomonas, NAA; Future   3. Microscopic hematuria Noted on UA today.  Unclear if related to #2 below, sample contamination or if true microscopic hematuria.  Will plan to repeat UA in 6 weeks and pursue hematuria workup as indicated.  Return in 6 weeks (on 07/06/2024) for Lab visit for urine, blood testing.  Lucie Hones, PA-C  [facility] [place] [address] [place], [place] [address] [phone]       [1]  Allergies Allergen Reactions   Peanuts [Peanut Oil]    Penicillins Other (See Comments)    Hives,rash,nausea,swelling,    Sulfa Antibiotics Other (See Comments)    Hives,rash,nausea,swelling   Corticosteroids     Other Reaction(s): RASH, SWELLING   Influenza Vac Split [Influenza Virus Vaccine]     Pleurisy  1996   Losartan      Reaction: Hypotension   Mobic  [Meloxicam ] Nausea Only    Renal insufficiency   Nickel    Nsaids Other (See Comments)    Decreased gfr    Penicillin G     Other Reaction(s): RASH , SWELLING   Prednisone  Rash   "

## 2024-05-26 ENCOUNTER — Ambulatory Visit: Admitting: Internal Medicine

## 2024-05-26 ENCOUNTER — Encounter: Payer: Self-pay | Admitting: Internal Medicine

## 2024-05-26 VITALS — BP 130/70 | HR 70 | Temp 97.8°F | Ht 63.0 in | Wt 140.0 lb

## 2024-05-26 DIAGNOSIS — B9681 Helicobacter pylori [H. pylori] as the cause of diseases classified elsewhere: Secondary | ICD-10-CM

## 2024-05-26 DIAGNOSIS — K2970 Gastritis, unspecified, without bleeding: Secondary | ICD-10-CM | POA: Diagnosis not present

## 2024-05-26 DIAGNOSIS — D563 Thalassemia minor: Secondary | ICD-10-CM | POA: Diagnosis not present

## 2024-05-26 DIAGNOSIS — D649 Anemia, unspecified: Secondary | ICD-10-CM

## 2024-05-26 DIAGNOSIS — K529 Noninfective gastroenteritis and colitis, unspecified: Secondary | ICD-10-CM | POA: Diagnosis not present

## 2024-05-26 DIAGNOSIS — R1013 Epigastric pain: Secondary | ICD-10-CM

## 2024-05-26 DIAGNOSIS — Z720 Tobacco use: Secondary | ICD-10-CM | POA: Diagnosis not present

## 2024-05-26 DIAGNOSIS — J439 Emphysema, unspecified: Secondary | ICD-10-CM | POA: Diagnosis not present

## 2024-05-26 MED ORDER — TIZANIDINE HCL 2 MG PO TABS: 2.0000 mg | ORAL_TABLET | Freq: Every day | ORAL | 1 refills | Status: AC

## 2024-05-26 MED ORDER — HYDROCODONE-ACETAMINOPHEN 5-325 MG PO TABS: 1.0000 | ORAL_TABLET | Freq: Two times a day (BID) | ORAL | 0 refills | Status: DC | PRN

## 2024-05-26 MED ORDER — AMITRIPTYLINE HCL 50 MG PO TABS: 50.0000 mg | ORAL_TABLET | Freq: Every day | ORAL | 1 refills | Status: AC

## 2024-05-26 MED ORDER — MEGESTROL ACETATE 20 MG PO TABS: 20.0000 mg | ORAL_TABLET | Freq: Every day | ORAL | 0 refills | Status: DC

## 2024-05-26 NOTE — Progress Notes (Unsigned)
 "  Subjective:  Patient ID: Lamarr Jenkins Half, female    DOB: 08-17-1955  Age: 68 y.o. MRN: [id number]  CC: There were no encounter diagnoses.   HPI Wynell Halberg presents for No chief complaint on file.  1) Recurrent UTI:  referred to Urology..  dec 29 visit reviewed:  bldder scan for PVR was normal UA was positive for  E coli and trichomonas . Husband and wife both treated,  scheduled a return visit for repeat testing including HIV in 6 weeks   2)chronic pain: managed with hydrocodone  , tramadol  and gabapentin .  Last refill  Nov 24  3) chronic abdominal pain: improving .  Has gained weight able to eat chicken, pork, turkey, not using IBGARD   4) BM biopsy: referred to [facility] .due to oimmature red cells.    Thalassemia .  3% chance of developing leukemia etc.  Per [facility] anemia improving with dietary iron  and iron  gummies   Outpatient Medications Prior to Visit  Medication Sig Dispense Refill   Albuterol -Budesonide (AIRSUPRA ) 90-80 MCG/ACT AERO Inhale 1 puff into the lungs every 6 (six) hours as needed. 5.9 g 11   amitriptyline  (ELAVIL ) 50 MG tablet TAKE 1 TABLET BY MOUTH AT BEDTIME 90 tablet 1   buPROPion  (WELLBUTRIN ) 100 MG tablet Take 1 tablet (100 mg total) by mouth 2 (two) times daily. 60 tablet 5   Calcium  Carbonate-Vit D-Min (CALCIUM  1200 PO) Take 1,200 mg by mouth daily.     carvedilol  (COREG ) 6.25 MG tablet Take 1 tablet (6.25 mg total) by mouth 2 (two) times daily with a meal. 180 tablet 3   Cholecalciferol (VITAMIN D ) 125 MCG (5000 UT) CAPS Take 5,000 Units by mouth daily.     clopidogrel  (PLAVIX ) 75 MG tablet Take 1 tablet (75 mg total) by mouth daily. 90 tablet 0   furosemide  (LASIX ) 20 MG tablet Take 1 tablet (20 mg total) by mouth daily as needed. 30 tablet 3   gabapentin  (NEURONTIN ) 300 MG capsule Take 1 capsule (300 mg total) by mouth 3 (three) times daily. 270 capsule 1   HYDROcodone -acetaminophen  (NORCO/VICODIN) 5-325 MG tablet Take 1 tablet by mouth 2 (two) times  daily as needed for moderate pain (pain score 4-6). 60 tablet 0   HYDROcodone -acetaminophen  (NORCO/VICODIN) 5-325 MG tablet Take 1 tablet by mouth 2 (two) times daily as needed for moderate pain (pain score 4-6). 60 tablet 0   HYDROcodone -acetaminophen  (NORCO/VICODIN) 5-325 MG tablet Take 1 tablet by mouth 2 (two) times daily as needed for moderate pain (pain score 4-6). 60 tablet 0   megestrol  (MEGACE ) 20 MG tablet Take 1 tablet by mouth once daily 30 tablet 0   metroNIDAZOLE  (FLAGYL ) 500 MG tablet Take 1 tablet (500 mg total) by mouth 2 (two) times daily for 7 days. 14 tablet 0   nitrofurantoin , macrocrystal-monohydrate, (MACROBID ) 100 MG capsule Take 1 capsule (100 mg total) by mouth 2 (two) times daily with a meal. 10 capsule 0   nitroGLYCERIN  (NITROSTAT ) 0.4 MG SL tablet Place 1 tablet (0.4 mg total) under the tongue every 5 (five) minutes as needed for chest pain. DISSOLVE 1 TABLET UNDER THE TONGUE EVERY 5 MINUTES AS  NEEDED FOR CHEST PAIN. MAX  OF 3 TABLETS IN 15 MINUTES. CALL 911 IF PAIN PERSISTS. 100 tablet 3   omeprazole  (PRILOSEC) 40 MG capsule Take 1 capsule (40 mg total) by mouth daily. 90 capsule 1   ondansetron  (ZOFRAN ) 4 MG tablet Take 1 tablet (4 mg total) by mouth every 8 (eight)  hours as needed. 60 tablet 2   Peppermint Oil (IBGARD) 90 MG CPCR Take 1 capsule by mouth daily.     Prasterone, DHEA, 10 MG CAPS Take 10 mg by mouth daily.     progesterone  (PROMETRIUM ) 200 MG capsule Take 200 mg by mouth at bedtime.     rOPINIRole  (REQUIP ) 1 MG tablet TAKE 1 TABLET BY MOUTH THREE TIMES DAILY 90 tablet 1   rosuvastatin  (CRESTOR ) 10 MG tablet Take 1 tablet (10 mg total) by mouth daily. 90 tablet 1   tiZANidine  (ZANAFLEX ) 2 MG tablet Take 1 tablet (2 mg total) by mouth at bedtime. 90 tablet 1   traMADol  (ULTRAM ) 50 MG tablet Take 1 tablet (50 mg total) by mouth 2 (two) times daily as needed. for pain 60 tablet 2   vitamin C  (ASCORBIC ACID ) 500 MG tablet Take 500 mg by mouth daily.     No  facility-administered medications prior to visit.    Review of Systems;  Patient denies headache, fevers, malaise, unintentional weight loss, skin rash, eye pain, sinus congestion and sinus pain, sore throat, dysphagia,  hemoptysis , cough, dyspnea, wheezing, chest pain, palpitations, orthopnea, edema, abdominal pain, nausea, melena, diarrhea, constipation, flank pain, dysuria, hematuria, urinary  Frequency, nocturia, numbness, tingling, seizures,  Focal weakness, Loss of consciousness,  Tremor, insomnia, depression, anxiety, and suicidal ideation.      Objective:  There were no vitals taken for this visit.  BP Readings from Last 3 Encounters:  05/25/24 137/72  05/01/24 132/70  04/07/24 128/69    Wt Readings from Last 3 Encounters:  05/25/24 137 lb (62.1 kg)  05/01/24 137 lb (62.1 kg)  04/07/24 134 lb 4.8 oz (60.9 kg)    Physical Exam  Lab Results  Component Value Date   HGBA1C 5.3 02/18/2024   HGBA1C 5.5 05/02/2022   HGBA1C 5.1 10/26/2015    Lab Results  Component Value Date   CREATININE 0.97 04/07/2024   CREATININE 0.95 03/16/2024   CREATININE 0.94 02/24/2024    Lab Results  Component Value Date   WBC 8.1 04/07/2024   HGB 8.9 (L) 04/07/2024   HCT 28.8 (L) 04/07/2024   PLT 217 04/07/2024   GLUCOSE 98 04/07/2024   CHOL 64 02/18/2024   TRIG 62.0 02/18/2024   HDL 28.00 (L) 02/18/2024   LDLDIRECT 31.0 02/18/2024   LDLCALC 24 02/18/2024   ALT 8 02/18/2024   AST 11 02/18/2024   NA 136 04/07/2024   K 4.4 04/07/2024   CL 106 04/07/2024   CREATININE 0.97 04/07/2024   BUN 11 04/07/2024   CO2 21 (L) 04/07/2024   TSH 0.78 02/18/2024   INR 1.1 07/17/2022   HGBA1C 5.3 02/18/2024    IR BONE MARROW BIOPSY & ASPIRATION Result Date: 03/05/2024 CLINICAL DATA:  Microcytic anemia/beta thalassemia minor. EXAM: Fluoro guided bone marrow biopsy TECHNIQUE: Fluoro pelvis CONTRAST:  None RADIOPHARMACEUTICALS:  None FLUOROSCOPY: 46.8 mGy COMPARISON:  None FINDINGS: The patient  was placed in prone position on the IR table. Radiopaque markers were placed on the patient's skin and initial imaging of the pelvis was performed. The patient's skin was then prepped and draped in the usual sterile fashion. Moderate sedation was provided for by the nursing staff under my supervision utilizing intravenous Versed  and fentanyl . The nurse had no other duties other than monitoring the patient and providing sedation during the procedure. I was present for the entire procedure. 2 mg intravenous Versed  and 100 mcg of intravenous fentanyl  were administered by the nursing staff for  total sedation time of 16 minutes 1% lidocaine  was used to infiltrate the skin at the access site prior to a stab incision. Local anesthesia was then used to infiltrate the region of soft tissue from the skin to the left iliac bone. The bone marrow needle was then advanced and imaging demonstrated the needle tip to be in the cortex of the left iliac bone. The bone was then penetrated and a sample was obtained. After the sample was evaluated, approximately 5 mL of heparinized bone marrow sample was obtained by aspiration. A core sample was then obtained. Multiple attempts at sampling was performed in order to get 2 1 cm segments. All needles were then removed from the patient. Sterile dressing was applied. IMPRESSION: Satisfactory core needle biopsy and aspiration of the left iliac bone marrow under fluoro guidance. Electronically Signed   By: Cordella Banner   On: 03/05/2024 10:15    Assessment & Plan:  .There are no diagnoses linked to this encounter.   I spent 34 minutes on the day of this face to face encounter reviewing patient's  most recent visit with cardiology,  nephrology,  and neurology,  prior relevant surgical and non surgical procedures, recent  labs and imaging studies, counseling on weight management,  reviewing the assessment and plan with patient, and post visit ordering and reviewing of  diagnostics and  therapeutics with patient  .   Follow-up: No follow-ups on file.   Verneita LITTIE Kettering, MD "

## 2024-05-26 NOTE — Patient Instructions (Signed)
 PLEASE SHARE YOUR PROBIOTIC WITH ED,  HE NEEDS TO TAKE ONE FOR 3 WEEKS   HYDROCODONE  REFILLED  GLAD YOU ARE FEELING BETTER

## 2024-05-27 ENCOUNTER — Ambulatory Visit: Payer: Self-pay | Admitting: Physician Assistant

## 2024-05-27 ENCOUNTER — Encounter: Payer: Self-pay | Admitting: Internal Medicine

## 2024-05-27 LAB — CT NG M GENITALIUM NAA, URINE
Chlamydia trachomatis, NAA: NEGATIVE
Mycoplasma genitalium NAA: NEGATIVE
Neisseria gonorrhoeae, NAA: NEGATIVE

## 2024-05-27 NOTE — Assessment & Plan Note (Signed)
 Advised to continue surveillance with CBC every 6 months

## 2024-05-27 NOTE — Assessment & Plan Note (Signed)
 Untreated due to intolerance of regimen.  Had EGD at [facility] in 2024  , biopsies done .  No H pylori  found .  Symptoms have improved.

## 2024-05-27 NOTE — Assessment & Plan Note (Signed)
 Now improved;  eating better and gaining weight

## 2024-05-27 NOTE — Assessment & Plan Note (Signed)
 She  has reduced her cigarette use from 1-2 packs daily to 2-3 daily.

## 2024-05-27 NOTE — Assessment & Plan Note (Signed)
 emphysema  Involving bilateral lung apices by prior CT.  . .  She has declined PULMONARY REFERRAL .SMOKING CESSATION ENCOURAGED. MDI refilled

## 2024-05-27 NOTE — Assessment & Plan Note (Signed)
"  Currently resolved   "

## 2024-05-27 NOTE — Assessment & Plan Note (Addendum)
 Recent transfusion required. For hgb > 7.    BM done.    Lab Results  Component Value Date   WBC 8.1 04/07/2024   HGB 8.9 (L) 04/07/2024   HCT 28.8 (L) 04/07/2024   MCV 73.3 (L) 04/07/2024   PLT 217 04/07/2024

## 2024-06-05 ENCOUNTER — Encounter: Payer: Self-pay | Admitting: Internal Medicine

## 2024-06-06 ENCOUNTER — Other Ambulatory Visit: Payer: Self-pay | Admitting: Cardiology

## 2024-06-08 ENCOUNTER — Encounter: Payer: Self-pay | Admitting: Internal Medicine

## 2024-06-10 ENCOUNTER — Ambulatory Visit: Payer: Self-pay | Admitting: Cardiology

## 2024-06-10 NOTE — Progress Notes (Unsigned)
 " Cardiology Office Note   Date:  06/10/2024  ID:  Cheryl [name], DOB 10-16-1955, MRN [id number] PCP: Cheryl Verneita CROME, MD  [facility] Providers Cardiologist:  Evalene Lunger, MD { Click to update primary MD,subspecialty MD or APP then REFRESH:1}    History of Present Illness Cheryl [name] is a 69 y.o. female with past medical history of chronic lip and vaginal, smoker x 41 years, obstructive sleep apnea on CPAP, fatigue, peripheral arterial disease, aortic atherosclerosis, mixed hyperlipidemia, splenic artery aneurysm, bilateral carotid artery stenosis, no significant coronary artery disease by cardiac catheterization [facility] 2023), is here today for follow-up.   Previous left heart catheterization was completed at [facility] in 2003 reportedly with no significant coronary artery disease at that time.  She had additional episodes of palpitations and chest pain.  For prior episodes of chest pain workup in June 2012 of stress testing/Myoview  showed no ischemia with moderate aortic atherosclerosis.  Repeat Myoview  in 01/30/2019 showed a fixed defect in the anteroseptal, apical region, likely attenuation artifact/bundle branch block though unable to exclude prior MI.  Estimated EF of 40%.  His CT scan in 2019 showed three-vessel coronary calcification with mild aortic arch atherosclerosis.  There have been no significant increase in splenic artery when compared to prior exams.  Mild increase in celiac artery when compared to exam on 11/03/2018 however more closely correlate with studies 09/14/2017 and 08/14/2016.  Ultrasound completed in March 2018 with Dr. Dreama documented greater than 70%, celiac artery disease.  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  She had been following up at [facility] and undergone 4 separate procedures at that time.  She did follow-up with vascular with Dr.  Jama for celiac artery stenosis and a tried access to the right groin and the left arm was unsuccessful which ended up sending her to [facility] for further evaluation.     ROS: ***  Studies Reviewed      Cardiac Studies & Procedures   ______________________________________________________________________________________________ CARDIAC CATHETERIZATION  CARDIAC CATHETERIZATION 07/15/2023  Conclusion   Prox RCA lesion is 20% stenosed.   Prox LAD to Mid LAD lesion is 20% stenosed.   There is moderate left ventricular systolic dysfunction.   The left ventricular ejection fraction is 35-45% by visual estimate.  1.  Mildly calcified coronary arteries with mild nonobstructive coronary artery disease. 2.  Moderately reduced LV systolic function with global hypokinesis. 3.  Right heart catheterization showed normal right and left filling pressures, normal pulmonary pressure and normal cardiac output.  Recommendations: Recommend medical therapy for nonobstructive coronary artery disease and chronic systolic heart failure which seems to be due to nonischemic cardiomyopathy.  Findings Coronary Findings Diagnostic  Dominance: Right  Left Anterior Descending Prox LAD to Mid LAD lesion is 20% stenosed.  First Diagonal Branch The vessel exhibits minimal luminal irregularities.  Second Diagonal Branch The vessel exhibits minimal luminal irregularities.  Left Circumflex The vessel exhibits minimal luminal irregularities.  Right Coronary Artery Prox RCA lesion is 20% stenosed.  Intervention  No interventions have been documented.   STRESS TESTS  NM PET CT CARDIAC PERFUSION MULTI W/ABSOLUTE BLOODFLOW 06/27/2023  Narrative   Small, mild, fixed apical defect that either represents infarction or apical thinning artifact. MBFR is normal favoring artifact. LVEF is reduced with septal movement consistent with LBBB. Intermediate risk study based on LVEF alone. Would recommend an  echocardiogram for clarification.  LV perfusion is abnormal. There is no evidence of ischemia. There is evidence of infarction. Defect 1: There is a small defect with mild reduction in uptake present in the apex location(s) that is fixed. Consistent with infarction.   Rest left ventricular function is abnormal. Rest global function is moderately reduced. There were no regional wall motion abnormalities. Rest EF: 34%. Stress left ventricular function is abnormal. Stress global function is mildly reduced. There were no regional wall abnormalities. Stress EF: 46%. End diastolic cavity size is normal.   Myocardial blood [name] was computed to be 0.57ml/g/min at rest and 1.77ml/g/min at stress. Global myocardial blood [name] reserve was 2.11 and was normal.   Coronary calcium  was present on the attenuation correction CT images. Severe coronary calcifications were present. Coronary calcifications were present in the left anterior descending artery and left circumflex artery distribution(s).   Findings are consistent with infarction. The study is intermediate risk.   Electronically signed by Darryle Decent, MD  CLINICAL DATA:  This over-read does not include interpretation of cardiac or coronary anatomy or pathology. The Cardiac PET CT interpretation by the cardiologist is attached.  COMPARISON:  CT chest angiogram 07/18/2022  FINDINGS: Cardiovascular: Aortic atherosclerosis. Normal heart size. Three-vessel coronary artery calcifications. No pericardial effusion.  Limited Mediastinum/Nodes: No enlarged mediastinal, hilar, or axillary lymph nodes. Trachea and esophagus demonstrate no significant findings.  Limited Lungs/Pleura: Mild centrilobular emphysema. No pleural effusion or pneumothorax.  Upper Abdomen: No acute abnormality.  Musculoskeletal: No chest wall abnormality. No acute osseous findings.  IMPRESSION: 1. No acute CT findings of the included chest. 2. Emphysema. 3. Three-vessel  coronary artery disease.  Aortic Atherosclerosis (ICD10-I70.0) and Emphysema (ICD10-J43.9).   Electronically Signed By: Marolyn JONETTA Jaksch M.D. On: 06/27/2023 09:38   ECHOCARDIOGRAM  ECHOCARDIOGRAM COMPLETE 07/19/2023  Narrative ECHOCARDIOGRAM REPORT    Patient Name:   Cheryl [name] Date of Exam: 07/19/2023 Medical Rec #:  [id number]           Height:       63.0 in Accession #:    [id number]          Weight:       133.6 lb Date of Birth:  [birth year]            BSA:          1.629 m Patient Age:    68 years            BP:           108/60 mmHg Patient Gender: F                   HR:           80 bpm. Exam Location:  [place]  Procedure: 2D Echo, 3D Echo, Cardiac Doppler and Color Doppler (Both Spectral and Color [name] Doppler were utilized during procedure).  Indications:    R06.02 SOB; R07.9* Chest pain, unspecified  History:        Patient has prior history of Echocardiogram examinations. PAD and COPD, Signs/Symptoms:Shortness of Breath and Chest Pain; Risk Factors:Sleep Apnea, Dyslipidemia and Current Smoker.  Sonographer:    Arley Pac RDMS, RVT, RDCS Referring Phys: [id number] Laurenashley Viar  IMPRESSIONS   1. Left ventricular ejection fraction, by estimation, is 40 to 45%. The left ventricle has mild to moderately decreased function. The left ventricle demonstrates regional wall motion abnormalities (see scoring diagram/findings for description). There is mild left ventricular hypertrophy. Left ventricular diastolic parameters are consistent with Grade I  diastolic dysfunction (impaired relaxation). There is mild hypokinesis of the left ventricular, entire anteroseptal wall. 2. Right ventricular systolic function is normal. The right ventricular size is normal. 3. The mitral valve is normal in structure. No evidence of mitral valve regurgitation. 4. The aortic valve is tricuspid. Aortic valve regurgitation is not visualized. 5. The inferior vena cava is normal in size with  greater than 50% respiratory variability, suggesting right atrial pressure of 3 mmHg.  FINDINGS Left Ventricle: Left ventricular ejection fraction, by estimation, is 40 to 45%. The left ventricle has mild to moderately decreased function. The left ventricle demonstrates regional wall motion abnormalities. Mild hypokinesis of the left ventricular, entire anteroseptal wall. Strain imaging was not performed. The left ventricular internal cavity size was normal in size. There is mild left ventricular hypertrophy. Abnormal (paradoxical) septal motion, consistent with left bundle branch block. Left ventricular diastolic parameters are consistent with Grade I diastolic dysfunction (impaired relaxation).  Right Ventricle: The right ventricular size is normal. No increase in right ventricular wall thickness. Right ventricular systolic function is normal.  Left Atrium: Left atrial size was normal in size.  Right Atrium: Right atrial size was normal in size.  Pericardium: There is no evidence of pericardial effusion.  Mitral Valve: The mitral valve is normal in structure. No evidence of mitral valve regurgitation.  Tricuspid Valve: The tricuspid valve is normal in structure. Tricuspid valve regurgitation is mild.  Aortic Valve: The aortic valve is tricuspid. Aortic valve regurgitation is not visualized. Aortic valve mean gradient measures 2.0 mmHg. Aortic valve peak gradient measures 4.7 mmHg. Aortic valve area, by VTI measures 2.19 cm.  Pulmonic Valve: The pulmonic valve was normal in structure. Pulmonic valve regurgitation is trivial.  Aorta: The aortic root is normal in size and structure.  Venous: The inferior vena cava is normal in size with greater than 50% respiratory variability, suggesting right atrial pressure of 3 mmHg.  IAS/Shunts: No atrial level shunt detected by color [name] Doppler.  Additional Comments: 3D was performed not requiring image post processing on an independent  workstation and was indeterminate.   LEFT VENTRICLE PLAX 2D LVIDd:         3.50 cm      Diastology LVIDs:         2.60 cm      LV e' medial:    5.22 cm/s LV PW:         1.20 cm      LV E/e' medial:  12.4 LV IVS:        1.30 cm      LV e' lateral:   9.90 cm/s LVOT diam:     1.80 cm      LV E/e' lateral: 6.5 LV SV:         45 LV SV Index:   28 LVOT Area:     2.54 cm  3D Volume EF: LV Volumes (MOD)            3D EF:        53 % LV vol d, MOD A2C: 104.0 ml LV EDV:       118 ml LV vol d, MOD A4C: 97.1 ml  LV ESV:       55 ml LV vol s, MOD A2C: 53.3 ml  LV SV:        63 ml LV vol s, MOD A4C: 59.5 ml LV SV MOD A2C:     50.7 ml LV SV MOD A4C:  97.1 ml LV SV MOD BP:      44.1 ml  RIGHT VENTRICLE             IVC RV Basal diam:  3.60 cm     IVC diam: 1.30 cm RV S prime:     18.00 cm/s TAPSE (M-mode): 2.3 cm  LEFT ATRIUM             Index LA diam:        3.40 cm 2.09 cm/m LA Vol (A2C):   53.3 ml 32.72 ml/m LA Vol (A4C):   32.5 ml 19.95 ml/m LA Biplane Vol: 42.1 ml 25.85 ml/m AORTIC VALVE                    PULMONIC VALVE AV Area (Vmax):    2.40 cm     PV Vmax:       1.34 m/s AV Area (Vmean):   2.22 cm     PV Peak grad:  7.1 mmHg AV Area (VTI):     2.19 cm AV Vmax:           108.00 cm/s AV Vmean:          71.500 cm/s AV VTI:            0.206 m AV Peak Grad:      4.7 mmHg AV Mean Grad:      2.0 mmHg LVOT Vmax:         102.00 cm/s LVOT Vmean:        62.400 cm/s LVOT VTI:          0.177 m LVOT/AV VTI ratio: 0.86  AORTA Ao Root diam: 3.10 cm Ao Asc diam:  3.10 cm Ao Arch diam: 2.5 cm  MITRAL VALVE               TRICUSPID VALVE MV Area (PHT): 4.83 cm    TR Peak grad:   19.5 mmHg MV Decel Time: 157 msec    TR Vmax:        221.00 cm/s MV E velocity: 64.50 cm/s MV A velocity: 98.00 cm/s  SHUNTS MV E/A ratio:  0.66        Systemic VTI:  0.18 m Systemic Diam: 1.80 cm  Redell Cave MD Electronically signed by Redell Cave MD Signature Date/Time:  07/19/2023/12:57:10 PM    Final    MONITORS  LONG TERM MONITOR (3-14 DAYS) 06/07/2023  Narrative Event monitor Patch Wear Time:  13 days and 23 hours (2024-12-19T12:15:00-0500 to 2025-01-02T12:14:52-0500)  Normal sinus rhythm Patient had a min HR of 57 bpm, max HR of 135 bpm, and avg HR of 81 bpm.  1 run of Ventricular Tachycardia occurred lasting 13 beats with a max rate of 122 bpm (avg 107 bpm).  3 Supraventricular Tachycardia runs occurred, the run with the fastest interval lasting 4 beats with a max rate of 132 bpm, the longest lasting 6 beats with an avg rate of 117 bpm.  Isolated SVEs were rare (<1.0%), SVE Couplets were rare (<1.0%), and SVE Triplets were rare (<1.0%). Isolated VEs were rare (<1.0%), VE Couplets were rare (<1.0%), and no VE Triplets were present.  Patient triggered events (77) associated with normal sinus rhythm  Signed, Velinda Lunger, MD, Ph.D [facility]       ______________________________________________________________________________________________      Risk Assessment/Calculations   No BP recorded.  {Refresh Note OR Click here to enter BP  :1}***       Physical Exam VS:  There were no vitals taken for this visit.       Wt Readings from Last 3 Encounters:  05/26/24 140 lb (63.5 kg)  05/25/24 137 lb (62.1 kg)  05/01/24 137 lb (62.1 kg)    GEN: Well nourished, well developed in no acute distress NECK: No JVD; No carotid bruits CARDIAC: ***RRR, no murmurs, rubs, gallops RESPIRATORY:  Clear to auscultation without rales, wheezing or rhonchi  ABDOMEN: Soft, non-tender, non-distended EXTREMITIES:  No edema; No deformity   ASSESSMENT AND PLAN ***    {Are you ordering a CV Procedure (e.g. stress test, cath, DCCV, TEE, etc)?   Press F2        :[id number]}  Dispo: ***  Signed, Cuca Benassi, NP   "

## 2024-06-17 ENCOUNTER — Other Ambulatory Visit: Payer: Self-pay | Admitting: Internal Medicine

## 2024-06-21 ENCOUNTER — Other Ambulatory Visit: Payer: Self-pay | Admitting: Internal Medicine

## 2024-06-26 ENCOUNTER — Other Ambulatory Visit: Payer: Self-pay

## 2024-06-26 NOTE — Telephone Encounter (Signed)
 Refilled: 05/26/2024 Last OV: 05/26/2024 Next OV: 08/24/2024  Pt stated she will come by next week to sign the controlled substance agreement next week when the weather has cleared up.

## 2024-06-27 ENCOUNTER — Other Ambulatory Visit: Payer: Self-pay | Admitting: Internal Medicine

## 2024-06-27 MED ORDER — HYDROCODONE-ACETAMINOPHEN 5-325 MG PO TABS: 1.0000 | ORAL_TABLET | Freq: Two times a day (BID) | ORAL | 0 refills | Status: AC | PRN

## 2024-07-01 ENCOUNTER — Ambulatory Visit: Admitting: Cardiology

## 2024-07-01 ENCOUNTER — Encounter: Payer: Self-pay | Admitting: Cardiology

## 2024-07-01 VITALS — BP 122/56 | HR 73 | Ht 63.0 in | Wt 137.4 lb

## 2024-07-01 DIAGNOSIS — I774 Celiac artery compression syndrome: Secondary | ICD-10-CM

## 2024-07-01 DIAGNOSIS — I10 Essential (primary) hypertension: Secondary | ICD-10-CM

## 2024-07-01 DIAGNOSIS — I2510 Atherosclerotic heart disease of native coronary artery without angina pectoris: Secondary | ICD-10-CM

## 2024-07-01 DIAGNOSIS — R079 Chest pain, unspecified: Secondary | ICD-10-CM

## 2024-07-01 DIAGNOSIS — E782 Mixed hyperlipidemia: Secondary | ICD-10-CM

## 2024-07-01 DIAGNOSIS — R0602 Shortness of breath: Secondary | ICD-10-CM

## 2024-07-01 DIAGNOSIS — I5021 Acute systolic (congestive) heart failure: Secondary | ICD-10-CM

## 2024-07-01 DIAGNOSIS — I739 Peripheral vascular disease, unspecified: Secondary | ICD-10-CM

## 2024-07-01 DIAGNOSIS — Z87891 Personal history of nicotine dependence: Secondary | ICD-10-CM

## 2024-07-01 DIAGNOSIS — R0609 Other forms of dyspnea: Secondary | ICD-10-CM

## 2024-07-01 DIAGNOSIS — I6523 Occlusion and stenosis of bilateral carotid arteries: Secondary | ICD-10-CM

## 2024-07-01 NOTE — Progress Notes (Unsigned)
 " Cardiology Office Note   Date:  07/03/2024  ID:  Cheryl [name], Cheryl [name] Sep 03, 1955, MRN [id number] PCP: Marylynn Verneita CROME, MD  [facility] Providers Cardiologist:  Evalene Lunger, MD     History of Present Illness Cheryl [name] is a 69 y.o. female with a past medical history of chronic left bundle branch block, smoker x 41+ years, obstructive sleep apnea on CPAP, fatigue, peripheral artery disease, aortic atherosclerosis, mixed hyperlipidemia, splenic artery aneurysm, bilateral carotid artery stenosis, no significant coronary artery disease by cardiac catheterization [facility] 2023), who is here today for follow-up.   Previous left heart catheterization was completed at [facility] in 2003 reportedly with no significant coronary artery disease at that time.  She had additional episodes of palpitations and chest pain.  For prior episodes of chest pain workup in June 2012 of stress testing/Myoview  showed no ischemia with moderate aortic atherosclerosis.  Repeat Myoview  in 01/30/2019 showed a fixed defect in the anteroseptal, apical region, likely attenuation artifact/bundle branch block though unable to exclude prior MI.  Estimated EF of 40%.  His CT scan in 2019 showed three-vessel coronary calcification with mild aortic arch atherosclerosis.  There have been no significant increase in splenic artery when compared to prior exams.  Mild increase in celiac artery when compared to exam on 11/03/2018 however more closely correlate with studies 09/14/2017 and 08/14/2016.  Ultrasound completed in March 2018 with Dr. Dreama documented greater than 70%, celiac artery disease.  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  She had been following up at [facility] and undergone 4 separate procedures at that time.  She did follow-up with vascular with Dr. Jama for celiac artery stenosis and a tried access to the  right groin and the left arm was unsuccessful which ended up sending her to [facility] for further evaluation.  She was evaluated in clinic 05/13/2023 continue to have complaints of shortness of breath and occasional vertigo with daytime activities.  Symptoms  been recurrent since  April 2024.  She also stated that she had some precordial chest discomfort associated shortness of breath or palpitations.  PCP had started on Imdur  30 mg daily for chest pain and given her sublingual nitro to take as needed.  She was scheduled for cardiac PET stress, an echocardiogram and a ZIO XT monitor.  She followed up with vascular [facility] 06/17/2023.  Last that she had a small postsurgical pseudoaneurysm that appeared to be increasing in size compared to prior CT.  Continue to plan for celiac artery stent placement that was going to be scheduled.  She was seen in clinic 07/04/2023 with continued chest pain shortness of breath and occasional vertigo.  States her symptoms been ongoing since her MALS surgery in April 2024.  She had recently undergone a cardiac PET stress test today which was concerning for infarct.  She had had prior heart catheterizations completed which showed no significant coronary artery disease.  She was scheduled for right and left heart catheterization.  Cath revealed nonobstructive coronary artery disease chronic systolic heart failure which seems to be due to nonischemic cardiomyopathy.  Recommendation was to continue medical therapy.  She was last seen in clinic 07/18/2023 by Dr. Gollan.  At that time she had complaints of weakness, fatigue, dizziness, shortness of breath and right arm pain since her cardiac catheterization.  Isosorbide  was decreased to 15 mg daily.  Due to recurrent GI  side effects additional medication's were deferred.   She returns to clinic today stating overall from a cardiac perspective she has been doing fairly well.  She recently had had just placement completed at [facility] without immediate  postoperative complications.  She states that her Imdur  had to be discontinued due to hypotension but beyond that she has been doing well.  She states that she continues to have headaches and has been taking Tylenol  for that.  She is also being worked up for anemia.  States that she has been compliant with her current medication regimen without any undue side effects.  She requires no refills at this time.  She states she has been under an increased amount of stress as she is caring for her husband in the home after an extensive hospitalization and visit to rehab.  But she denies any recent hospitalizations or visits to the emergency department.  ROS: 10 point review of system has been reviewed and considered negative the exception was been listed in HPI  Studies Reviewed EKG Interpretation Date/Time:  Wednesday July 01 2024 14:58:05 EST Ventricular Rate:  73 PR Interval:  168 QRS Duration:  154 QT Interval:  430 QTC Calculation: 473 R Axis:   -1  Text Interpretation: Normal sinus rhythm Left bundle branch block When compared with ECG of 22-Jul-2023 08:47, No significant change was found Confirmed by Gerard Frederick ([id number]) on 07/01/2024 3:08:50 PM    Cardiac Studies & Procedures   ______________________________________________________________________________________________ CARDIAC CATHETERIZATION  CARDIAC CATHETERIZATION 07/15/2023  Conclusion   Prox RCA lesion is 20% stenosed.   Prox LAD to Mid LAD lesion is 20% stenosed.   There is moderate left ventricular systolic dysfunction.   The left ventricular ejection fraction is 35-45% by visual estimate.  1.  Mildly calcified coronary arteries with mild nonobstructive coronary artery disease. 2.  Moderately reduced LV systolic function with global hypokinesis. 3.  Right heart catheterization showed normal right and left filling pressures, normal pulmonary pressure and normal cardiac output.  Recommendations: Recommend medical therapy  for nonobstructive coronary artery disease and chronic systolic heart failure which seems to be due to nonischemic cardiomyopathy.  Findings Coronary Findings Diagnostic  Dominance: Right  Left Anterior Descending Prox LAD to Mid LAD lesion is 20% stenosed.  First Diagonal Branch The vessel exhibits minimal luminal irregularities.  Second Diagonal Branch The vessel exhibits minimal luminal irregularities.  Left Circumflex The vessel exhibits minimal luminal irregularities.  Right Coronary Artery Prox RCA lesion is 20% stenosed.  Intervention  No interventions have been documented.   STRESS TESTS  NM PET CT CARDIAC PERFUSION MULTI W/ABSOLUTE BLOODFLOW 06/27/2023  Narrative   Small, mild, fixed apical defect that either represents infarction or apical thinning artifact. MBFR is normal favoring artifact. LVEF is reduced with septal movement consistent with LBBB. Intermediate risk study based on LVEF alone. Would recommend an echocardiogram for clarification.   LV perfusion is abnormal. There is no evidence of ischemia. There is evidence of infarction. Defect 1: There is a small defect with mild reduction in uptake present in the apex location(s) that is fixed. Consistent with infarction.   Rest left ventricular function is abnormal. Rest global function is moderately reduced. There were no regional wall motion abnormalities. Rest EF: 34%. Stress left ventricular function is abnormal. Stress global function is mildly reduced. There were no regional wall abnormalities. Stress EF: 46%. End diastolic cavity size is normal.   Myocardial blood flow was computed to be 0.64ml/g/min at rest and 1.77ml/g/min at stress. Global  myocardial blood flow reserve was 2.11 and was normal.   Coronary calcium  was present on the attenuation correction CT images. Severe coronary calcifications were present. Coronary calcifications were present in the left anterior descending artery and left circumflex artery  distribution(s).   Findings are consistent with infarction. The study is intermediate risk.   Electronically signed by Darryle Decent, MD  CLINICAL DATA:  This over-read does not include interpretation of cardiac or coronary anatomy or pathology. The Cardiac PET CT interpretation by the cardiologist is attached.  COMPARISON:  CT chest angiogram 07/18/2022  FINDINGS: Cardiovascular: Aortic atherosclerosis. Normal heart size. Three-vessel coronary artery calcifications. No pericardial effusion.  Limited Mediastinum/Nodes: No enlarged mediastinal, hilar, or axillary lymph nodes. Trachea and esophagus demonstrate no significant findings.  Limited Lungs/Pleura: Mild centrilobular emphysema. No pleural effusion or pneumothorax.  Upper Abdomen: No acute abnormality.  Musculoskeletal: No chest wall abnormality. No acute osseous findings.  IMPRESSION: 1. No acute CT findings of the included chest. 2. Emphysema. 3. Three-vessel coronary artery disease.  Aortic Atherosclerosis (ICD10-I70.0) and Emphysema (ICD10-J43.9).   Electronically Signed By: Marolyn JONETTA Jaksch M.D. On: 06/27/2023 09:38   ECHOCARDIOGRAM  ECHOCARDIOGRAM COMPLETE 07/19/2023  Narrative ECHOCARDIOGRAM REPORT    Patient Name:   Cheryl [name] Date of Exam: 07/19/2023 Medical Rec #:  [id number]           Height:       63.0 in Accession #:    [id number]          Weight:       133.6 lb Date of Birth:  Oct 18, 1955            BSA:          1.629 m Patient Age:    68 years            BP:           108/60 mmHg Patient Gender: F                   HR:           80 bpm. Exam Location:  [facility]  Procedure: 2D Echo, 3D Echo, Cardiac Doppler and Color Doppler (Both Spectral and Color Flow Doppler were utilized during procedure).  Indications:    R06.02 SOB; R07.9* Chest pain, unspecified  History:        Patient has prior history of Echocardiogram examinations. PAD and COPD, Signs/Symptoms:Shortness of Breath and  Chest Pain; Risk Factors:Sleep Apnea, Dyslipidemia and Current Smoker.  Sonographer:    Arley Pac RDMS, RVT, RDCS Referring Phys: [id number] Asmaa Tirpak  IMPRESSIONS   1. Left ventricular ejection fraction, by estimation, is 40 to 45%. The left ventricle has mild to moderately decreased function. The left ventricle demonstrates regional wall motion abnormalities (see scoring diagram/findings for description). There is mild left ventricular hypertrophy. Left ventricular diastolic parameters are consistent with Grade I diastolic dysfunction (impaired relaxation). There is mild hypokinesis of the left ventricular, entire anteroseptal wall. 2. Right ventricular systolic function is normal. The right ventricular size is normal. 3. The mitral valve is normal in structure. No evidence of mitral valve regurgitation. 4. The aortic valve is tricuspid. Aortic valve regurgitation is not visualized. 5. The inferior vena cava is normal in size with greater than 50% respiratory variability, suggesting right atrial pressure of 3 mmHg.  FINDINGS Left Ventricle: Left ventricular ejection fraction, by estimation, is 40 to 45%. The left ventricle has mild to moderately decreased function. The left ventricle demonstrates regional wall  motion abnormalities. Mild hypokinesis of the left ventricular, entire anteroseptal wall. Strain imaging was not performed. The left ventricular internal cavity size was normal in size. There is mild left ventricular hypertrophy. Abnormal (paradoxical) septal motion, consistent with left bundle branch block. Left ventricular diastolic parameters are consistent with Grade I diastolic dysfunction (impaired relaxation).  Right Ventricle: The right ventricular size is normal. No increase in right ventricular wall thickness. Right ventricular systolic function is normal.  Left Atrium: Left atrial size was normal in size.  Right Atrium: Right atrial size was normal in  size.  Pericardium: There is no evidence of pericardial effusion.  Mitral Valve: The mitral valve is normal in structure. No evidence of mitral valve regurgitation.  Tricuspid Valve: The tricuspid valve is normal in structure. Tricuspid valve regurgitation is mild.  Aortic Valve: The aortic valve is tricuspid. Aortic valve regurgitation is not visualized. Aortic valve mean gradient measures 2.0 mmHg. Aortic valve peak gradient measures 4.7 mmHg. Aortic valve area, by VTI measures 2.19 cm.  Pulmonic Valve: The pulmonic valve was normal in structure. Pulmonic valve regurgitation is trivial.  Aorta: The aortic root is normal in size and structure.  Venous: The inferior vena cava is normal in size with greater than 50% respiratory variability, suggesting right atrial pressure of 3 mmHg.  IAS/Shunts: No atrial level shunt detected by color flow Doppler.  Additional Comments: 3D was performed not requiring image post processing on an independent workstation and was indeterminate.   LEFT VENTRICLE PLAX 2D LVIDd:         3.50 cm      Diastology LVIDs:         2.60 cm      LV e' medial:    5.22 cm/s LV PW:         1.20 cm      LV E/e' medial:  12.4 LV IVS:        1.30 cm      LV e' lateral:   9.90 cm/s LVOT diam:     1.80 cm      LV E/e' lateral: 6.5 LV SV:         45 LV SV Index:   28 LVOT Area:     2.54 cm  3D Volume EF: LV Volumes (MOD)            3D EF:        53 % LV vol d, MOD A2C: 104.0 ml LV EDV:       118 ml LV vol d, MOD A4C: 97.1 ml  LV ESV:       55 ml LV vol s, MOD A2C: 53.3 ml  LV SV:        63 ml LV vol s, MOD A4C: 59.5 ml LV SV MOD A2C:     50.7 ml LV SV MOD A4C:     97.1 ml LV SV MOD BP:      44.1 ml  RIGHT VENTRICLE             IVC RV Basal diam:  3.60 cm     IVC diam: 1.30 cm RV S prime:     18.00 cm/s TAPSE (M-mode): 2.3 cm  LEFT ATRIUM             Index LA diam:        3.40 cm 2.09 cm/m LA Vol (A2C):   53.3 ml 32.72 ml/m LA Vol (A4C):   32.5 ml 19.95  ml/m LA Biplane  Vol: 42.1 ml 25.85 ml/m AORTIC VALVE                    PULMONIC VALVE AV Area (Vmax):    2.40 cm     PV Vmax:       1.34 m/s AV Area (Vmean):   2.22 cm     PV Peak grad:  7.1 mmHg AV Area (VTI):     2.19 cm AV Vmax:           108.00 cm/s AV Vmean:          71.500 cm/s AV VTI:            0.206 m AV Peak Grad:      4.7 mmHg AV Mean Grad:      2.0 mmHg LVOT Vmax:         102.00 cm/s LVOT Vmean:        62.400 cm/s LVOT VTI:          0.177 m LVOT/AV VTI ratio: 0.86  AORTA Ao Root diam: 3.10 cm Ao Asc diam:  3.10 cm Ao Arch diam: 2.5 cm  MITRAL VALVE               TRICUSPID VALVE MV Area (PHT): 4.83 cm    TR Peak grad:   19.5 mmHg MV Decel Time: 157 msec    TR Vmax:        221.00 cm/s MV E velocity: 64.50 cm/s MV A velocity: 98.00 cm/s  SHUNTS MV E/A ratio:  0.66        Systemic VTI:  0.18 m Systemic Diam: 1.80 cm  Redell Cave MD Electronically signed by Redell Cave MD Signature Date/Time: 07/19/2023/12:57:10 PM    Final    MONITORS  LONG TERM MONITOR (3-14 DAYS) 06/07/2023  Narrative Event monitor Patch Wear Time:  13 days and 23 hours (2024-12-19T12:15:00-0500 to 2025-01-02T12:14:52-0500)  Normal sinus rhythm Patient had a min HR of 57 bpm, max HR of 135 bpm, and avg HR of 81 bpm.  1 run of Ventricular Tachycardia occurred lasting 13 beats with a max rate of 122 bpm (avg 107 bpm).  3 Supraventricular Tachycardia runs occurred, the run with the fastest interval lasting 4 beats with a max rate of 132 bpm, the longest lasting 6 beats with an avg rate of 117 bpm.  Isolated SVEs were rare (<1.0%), SVE Couplets were rare (<1.0%), and SVE Triplets were rare (<1.0%). Isolated VEs were rare (<1.0%), VE Couplets were rare (<1.0%), and no VE Triplets were present.  Patient triggered events (77) associated with normal sinus rhythm  Signed, Velinda Lunger, MD, Ph.D [facility]        ______________________________________________________________________________________________      Risk Assessment/Calculations      Physical Exam VS:  BP (!) 122/56 (BP Location: Left Arm, Patient Position: Sitting, Cuff Size: Normal)   Pulse 73   Ht 5' 3 (1.6 m)   Wt 137 lb 6.4 oz (62.3 kg)   SpO2 98%   BMI 24.34 kg/m        Wt Readings from Last 3 Encounters:  07/01/24 137 lb 6.4 oz (62.3 kg)  05/26/24 140 lb (63.5 kg)  05/25/24 137 lb (62.1 kg)    GEN: Well nourished, well developed in no acute distress NECK: No JVD; No carotid bruits CARDIAC: RRR, no murmurs, rubs, gallops RESPIRATORY:  Clear to auscultation without rales, wheezing or rhonchi  ABDOMEN: Soft, non-tender, non-distended EXTREMITIES:  No edema; No deformity  ASSESSMENT AND PLAN Nonobstructive coronary artery disease with prior heart catheterization in February 2025 that revealed mildly calcified coronary arteries with mild nonobstructive coronary artery disease, moderately reduced LV systolic function with global hypokinesis, right heart catheterization showed normal right and left filling pressures, normal pulmonary pressure and normal cardiac output.  She returns today with recurrent chest pain EKG today reveals sinus rhythm with a chronic left rate of 73 with no acute ischemic changes.  She has been continued on clopidogrel  75 mg daily carvedilol  6.25 mg twice daily, and rosuvastatin .  No further ischemic evaluation needed at this time.  HFmrEF /shortness of breath dyspnea on exertion that has remained stable.  She continues to remain euvolemic on exam.  She continues to suffer from NYHA class II she is continued on carvedilol  6.25 mg twice daily, furosemide  20 mg as needed.  GDMT is unable to be due to excess.  Will defer addition of SGLT2 inhibitor at this time.  Primary hypertension with a blood pressure today 122/56.  Blood pressures remained stable.  She is continued on carvedilol  6.25 mg twice  daily and furosemide .  Previously she was on Imdur  that had to be discontinued due to hypotension.  Mixed hyperlipidemia with prior direct LDL of 31 she is continued on rosuvastatin  10 mg daily.  Celiac artery stenosis status post bowel surgery [facility] in April 2024.  She continues to follow had celiac artery stent placement which occasions.  Ongoing management per [facility].  Peripheral arterial disease/carotid artery stenosis she is encouraged to continue with aggressive management of blood pressure and cholesterol.  She is continued on rosuvastatin  10 mg daily with the last direct LDL of 31.  She is due for updated carotid duplex.  Ongoing management per [facility].  Tobacco abuse cessation continues to be recommended.       Dispo: Patient to return to clinic to see MD/APP in 11-12 months or sooner if needed for further evaluation.  Signed, Irean Kendricks, NP   "

## 2024-07-01 NOTE — Patient Instructions (Signed)
 Medication Instructions:   Your physician recommends that you continue on your current medications as directed. Please refer to the Current Medication list given to you today.   *If you need a refill on your cardiac medications before your next appointment, please call your pharmacy*  Lab Work: No labs ordered today  If you have labs (blood work) drawn today and your tests are completely normal, you will receive your results only by: MyChart Message (if you have MyChart) OR A paper copy in the mail If you have any lab test that is abnormal or we need to change your treatment, we will call you to review the results.  Testing/Procedures: No test ordered today   Follow-Up: At [facility], you and your health needs are our priority.  As part of our continuing mission to provide you with exceptional heart care, our providers are all part of one team.  This team includes your primary Cardiologist (physician) and Advanced Practice Providers or APPs (Physician Assistants and Nurse Practitioners) who all work together to provide you with the care you need, when you need it.  Your next appointment:   1 year(s)  Provider:   You may see Timothy Gollan, MD  or one of the following Advanced Practice Providers on your designated Care Team:   Laneta Pintos, NP Gildardo Labrador, PA-C Varney Gentleman, PA-C Cadence Alta Sierra, PA-C Ronald Cockayne, NP Morey Ar, NP    We recommend signing up for the patient portal called MyChart.  Sign up information is provided on this After Visit Summary.  MyChart is used to connect with patients for Virtual Visits (Telemedicine).  Patients are able to view lab/test results, encounter notes, upcoming appointments, etc.  Non-urgent messages can be sent to your provider as well.   To learn more about what you can do with MyChart, go to [email or web address].

## 2024-07-07 ENCOUNTER — Other Ambulatory Visit

## 2024-07-20 ENCOUNTER — Ambulatory Visit: Payer: Self-pay | Admitting: Pulmonary Disease

## 2024-08-24 ENCOUNTER — Ambulatory Visit: Admitting: Internal Medicine

## 2024-10-20 ENCOUNTER — Ambulatory Visit
